# Patient Record
Sex: Female | Born: 1950
Health system: Southern US, Community
[De-identification: ages and names within clinical notes are randomized; demographics above are authoritative.]

## PROBLEM LIST (undated history)

## (undated) DIAGNOSIS — K760 Fatty (change of) liver, not elsewhere classified: Secondary | ICD-10-CM

## (undated) DIAGNOSIS — N1832 Chronic kidney disease, stage 3b: Secondary | ICD-10-CM

## (undated) DIAGNOSIS — R945 Abnormal results of liver function studies: Secondary | ICD-10-CM

## (undated) DIAGNOSIS — E1140 Type 2 diabetes mellitus with diabetic neuropathy, unspecified: Secondary | ICD-10-CM

## (undated) DIAGNOSIS — K4090 Unilateral inguinal hernia, without obstruction or gangrene, not specified as recurrent: Secondary | ICD-10-CM

## (undated) DIAGNOSIS — I10 Essential (primary) hypertension: Secondary | ICD-10-CM

## (undated) DIAGNOSIS — IMO0002 Reserved for concepts with insufficient information to code with codable children: Secondary | ICD-10-CM

## (undated) DIAGNOSIS — E785 Hyperlipidemia, unspecified: Secondary | ICD-10-CM

## (undated) DIAGNOSIS — E1165 Type 2 diabetes mellitus with hyperglycemia: Secondary | ICD-10-CM

## (undated) DIAGNOSIS — M4714 Other spondylosis with myelopathy, thoracic region: Secondary | ICD-10-CM

## (undated) DIAGNOSIS — Z993 Dependence on wheelchair: Secondary | ICD-10-CM

## (undated) DIAGNOSIS — R7989 Other specified abnormal findings of blood chemistry: Secondary | ICD-10-CM

## (undated) DIAGNOSIS — K429 Umbilical hernia without obstruction or gangrene: Secondary | ICD-10-CM

## (undated) HISTORY — DX: Other specified abnormal findings of blood chemistry: R79.89

## (undated) HISTORY — DX: Dependence on wheelchair: Z99.3

## (undated) HISTORY — DX: Type 2 diabetes mellitus with diabetic neuropathy, unspecified: E11.65

## (undated) HISTORY — DX: Umbilical hernia without obstruction or gangrene: K42.9

## (undated) HISTORY — DX: Reserved for concepts with insufficient information to code with codable children: IMO0002

## (undated) HISTORY — DX: Type 2 diabetes mellitus with diabetic neuropathy, unspecified: E11.40

## (undated) HISTORY — DX: Fatty (change of) liver, not elsewhere classified: K76.0

## (undated) HISTORY — DX: Unilateral inguinal hernia, without obstruction or gangrene, not specified as recurrent: K40.90

## (undated) HISTORY — DX: Chronic kidney disease, stage 3b: N18.32

## (undated) HISTORY — PX: BREAST BIOPSY: SHX20

## (undated) HISTORY — DX: Other spondylosis with myelopathy, thoracic region: M47.14

## (undated) HISTORY — PX: LUMBAR DISC SURGERY: SHX700

## (undated) HISTORY — PX: CATARACT EXTRACTION: SUR2

## (undated) HISTORY — DX: Abnormal results of liver function studies: R94.5

---
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
## 2015-07-03 ENCOUNTER — Inpatient Hospital Stay (HOSPITAL_COMMUNITY)
Admission: EM | Admit: 2015-07-03 | Discharge: 2015-07-10 | DRG: 516 | Disposition: A | Discharge status: Skilled Nursing Facility | Payer: Medicare Other | Attending: Internal Medicine | Admitting: Internal Medicine

## 2015-07-03 ENCOUNTER — Emergency Department (HOSPITAL_COMMUNITY): Payer: Medicare Other

## 2015-07-03 ENCOUNTER — Encounter (HOSPITAL_COMMUNITY): Payer: Self-pay

## 2015-07-03 DIAGNOSIS — E1159 Type 2 diabetes mellitus with other circulatory complications: Secondary | ICD-10-CM | POA: Diagnosis present

## 2015-07-03 DIAGNOSIS — Z794 Long term (current) use of insulin: Secondary | ICD-10-CM

## 2015-07-03 DIAGNOSIS — I10 Essential (primary) hypertension: Secondary | ICD-10-CM | POA: Diagnosis present

## 2015-07-03 DIAGNOSIS — E1142 Type 2 diabetes mellitus with diabetic polyneuropathy: Secondary | ICD-10-CM | POA: Diagnosis present

## 2015-07-03 DIAGNOSIS — N390 Urinary tract infection, site not specified: Secondary | ICD-10-CM | POA: Diagnosis not present

## 2015-07-03 DIAGNOSIS — Z7982 Long term (current) use of aspirin: Secondary | ICD-10-CM

## 2015-07-03 DIAGNOSIS — R29898 Other symptoms and signs involving the musculoskeletal system: Secondary | ICD-10-CM | POA: Diagnosis present

## 2015-07-03 DIAGNOSIS — E1140 Type 2 diabetes mellitus with diabetic neuropathy, unspecified: Secondary | ICD-10-CM | POA: Diagnosis present

## 2015-07-03 DIAGNOSIS — G8222 Paraplegia, incomplete: Secondary | ICD-10-CM | POA: Diagnosis present

## 2015-07-03 DIAGNOSIS — E785 Hyperlipidemia, unspecified: Secondary | ICD-10-CM | POA: Diagnosis present

## 2015-07-03 DIAGNOSIS — E1121 Type 2 diabetes mellitus with diabetic nephropathy: Secondary | ICD-10-CM | POA: Diagnosis present

## 2015-07-03 DIAGNOSIS — Z993 Dependence on wheelchair: Secondary | ICD-10-CM

## 2015-07-03 DIAGNOSIS — I129 Hypertensive chronic kidney disease with stage 1 through stage 4 chronic kidney disease, or unspecified chronic kidney disease: Secondary | ICD-10-CM | POA: Diagnosis present

## 2015-07-03 DIAGNOSIS — K760 Fatty (change of) liver, not elsewhere classified: Secondary | ICD-10-CM | POA: Diagnosis present

## 2015-07-03 DIAGNOSIS — Z79899 Other long term (current) drug therapy: Secondary | ICD-10-CM

## 2015-07-03 DIAGNOSIS — M5126 Other intervertebral disc displacement, lumbar region: Secondary | ICD-10-CM

## 2015-07-03 DIAGNOSIS — S82122A Displaced fracture of lateral condyle of left tibia, initial encounter for closed fracture: Secondary | ICD-10-CM

## 2015-07-03 DIAGNOSIS — E1165 Type 2 diabetes mellitus with hyperglycemia: Secondary | ICD-10-CM | POA: Diagnosis present

## 2015-07-03 DIAGNOSIS — R7989 Other specified abnormal findings of blood chemistry: Secondary | ICD-10-CM | POA: Diagnosis present

## 2015-07-03 DIAGNOSIS — B9620 Unspecified Escherichia coli [E. coli] as the cause of diseases classified elsewhere: Secondary | ICD-10-CM | POA: Diagnosis present

## 2015-07-03 DIAGNOSIS — E875 Hyperkalemia: Secondary | ICD-10-CM | POA: Diagnosis present

## 2015-07-03 DIAGNOSIS — R262 Difficulty in walking, not elsewhere classified: Secondary | ICD-10-CM | POA: Diagnosis present

## 2015-07-03 DIAGNOSIS — Z6841 Body Mass Index (BMI) 40.0 and over, adult: Secondary | ICD-10-CM

## 2015-07-03 DIAGNOSIS — M4804 Spinal stenosis, thoracic region: Principal | ICD-10-CM | POA: Diagnosis present

## 2015-07-03 DIAGNOSIS — N183 Chronic kidney disease, stage 3 (moderate): Secondary | ICD-10-CM | POA: Diagnosis present

## 2015-07-03 DIAGNOSIS — Z419 Encounter for procedure for purposes other than remedying health state, unspecified: Secondary | ICD-10-CM

## 2015-07-03 DIAGNOSIS — R945 Abnormal results of liver function studies: Secondary | ICD-10-CM

## 2015-07-03 DIAGNOSIS — M5124 Other intervertebral disc displacement, thoracic region: Secondary | ICD-10-CM

## 2015-07-03 DIAGNOSIS — W19XXXA Unspecified fall, initial encounter: Secondary | ICD-10-CM | POA: Diagnosis present

## 2015-07-03 DIAGNOSIS — E1122 Type 2 diabetes mellitus with diabetic chronic kidney disease: Secondary | ICD-10-CM | POA: Diagnosis present

## 2015-07-03 HISTORY — DX: Essential (primary) hypertension: I10

## 2015-07-03 HISTORY — DX: Hyperlipidemia, unspecified: E78.5

## 2015-07-03 LAB — URINALYSIS, ROUTINE W REFLEX MICROSCOPIC
GLUCOSE, UA: 250 mg/dL — AB
KETONES UR: 15 mg/dL — AB
Nitrite: POSITIVE — AB
PH: 5 (ref 5.0–8.0)
Protein, ur: 30 mg/dL — AB
Specific Gravity, Urine: 1.025 (ref 1.005–1.030)

## 2015-07-03 LAB — CBC WITH DIFFERENTIAL/PLATELET
BASOS ABS: 0 10*3/uL (ref 0.0–0.1)
Basophils Relative: 1 %
Eosinophils Absolute: 0.3 10*3/uL (ref 0.0–0.7)
Eosinophils Relative: 4 %
HEMATOCRIT: 38.9 % (ref 36.0–46.0)
Hemoglobin: 12.3 g/dL (ref 12.0–15.0)
LYMPHS ABS: 2.9 10*3/uL (ref 0.7–4.0)
LYMPHS PCT: 44 %
MCH: 28.7 pg (ref 26.0–34.0)
MCHC: 31.6 g/dL (ref 30.0–36.0)
MCV: 90.9 fL (ref 78.0–100.0)
MONO ABS: 0.6 10*3/uL (ref 0.1–1.0)
Monocytes Relative: 9 %
NEUTROS ABS: 2.8 10*3/uL (ref 1.7–7.7)
Neutrophils Relative %: 42 %
Platelets: 327 10*3/uL (ref 150–400)
RBC: 4.28 MIL/uL (ref 3.87–5.11)
RDW: 14 % (ref 11.5–15.5)
WBC: 6.6 10*3/uL (ref 4.0–10.5)

## 2015-07-03 LAB — URINE MICROSCOPIC-ADD ON

## 2015-07-03 LAB — COMPREHENSIVE METABOLIC PANEL
ALT: 58 U/L — AB (ref 14–54)
AST: 66 U/L — AB (ref 15–41)
Albumin: 3.6 g/dL (ref 3.5–5.0)
Alkaline Phosphatase: 70 U/L (ref 38–126)
Anion gap: 13 (ref 5–15)
BILIRUBIN TOTAL: 0.7 mg/dL (ref 0.3–1.2)
BUN: 19 mg/dL (ref 6–20)
CO2: 25 mmol/L (ref 22–32)
CREATININE: 1.27 mg/dL — AB (ref 0.44–1.00)
Calcium: 10 mg/dL (ref 8.9–10.3)
Chloride: 102 mmol/L (ref 101–111)
GFR, EST AFRICAN AMERICAN: 50 mL/min — AB (ref >60)
GFR, EST NON AFRICAN AMERICAN: 43 mL/min — AB (ref >60)
Glucose, Bld: 180 mg/dL — ABNORMAL HIGH (ref 65–99)
Potassium: 4.5 mmol/L (ref 3.5–5.1)
Sodium: 140 mmol/L (ref 135–145)
TOTAL PROTEIN: 7.2 g/dL (ref 6.5–8.1)

## 2015-07-03 LAB — I-STAT TROPONIN, ED: TROPONIN I, POC: 0 ng/mL (ref 0.00–0.08)

## 2015-07-03 MED ORDER — MORPHINE SULFATE (PF) 4 MG/ML IV SOLN
4.0000 mg | Freq: Once | INTRAVENOUS | Status: AC
  Administered 2015-07-03: 4 mg via INTRAVENOUS
  Filled 2015-07-03: qty 1

## 2015-07-03 MED ORDER — DEXTROSE 5 % IV SOLN
1.0000 g | Freq: Once | INTRAVENOUS | Status: AC
  Administered 2015-07-03: 1 g via INTRAVENOUS
  Filled 2015-07-03 (×2): qty 10

## 2015-07-03 NOTE — Progress Notes (Addendum)
[facility].  Per EDP, patient has home health services, believes she needs placement.  EDCM [facility] who will see patient.   07/03/2015 A. Areyanna Figeroa RNCM 2245pm Patient reports her pcp is a female last name Daphine DeutscherMartin whom she has not seen.  She reports she does not know what home health agency she has been set up with.  EDCM has not spoken to this patient.  This [facility].  Patient has been difficult to speak too as she has been having multiple procedures performed while in the ED.

## 2015-07-03 NOTE — Progress Notes (Signed)
Spoke with pt re: her disposition.  [facility] cannot manage [facility] in her [facility].  [facility], following a 21 day stay.  Pt's admission status unclear at this time.  If pt remains in ED overnight, CSW will pursue placement from the ED.  PT/OT c/s will be necessary prior to placement, EDP informed and will order.  CSW will also need to [facility] other[facility] facility her [facility] benefits/remaining SNF, as she has had a recent stay.  CSW will continue to follow.

## 2015-07-03 NOTE — ED Notes (Signed)
Pt reported that she did not take her diuretic today b/c she did not want to "go to the bathroom" because she "did not want to fall again."

## 2015-07-03 NOTE — ED Provider Notes (Signed)
CSN: [id number]     Arrival date & time 07/03/15  1855 History   First MD Initiated Contact with Patient 07/03/15 1902     Chief Complaint  Patient presents with  . Fall  . Weakness     (Consider location/radiation/quality/duration/timing/severity/associated sxs/prior Treatment) The history is provided by the patient.  Tressie StalkerBarbara Urbanek is a 65 y.o. female hx of DM, HTN, HL, Here presenting with weakness. Patient actually has chronic left leg weakness for the last Several months. [facility] and had extensive workup. She had MRI brain that was unremarkable, MRI spine and multiple xrays that was unremarkable per patient. [facility] and stayed there for 21 days. She was still weak so [facility] and was discharged 2 days ago. She has been at home for the last 2 days. States that she was weak and trouble bearing weight on the left leg at rehab. Since she came home, her left leg gave out and she fell multiple times. Denies head injury. Has left knee pain and back pain.    Past Medical History  Diagnosis Date  . Diabetes mellitus without complication (HCC)   . Hypertension   . Hyperlipemia    History reviewed. No pertinent past surgical history. No family history on file. Social History  Substance Use Topics  . Smoking status: Never Smoker   . Smokeless tobacco: None  . Alcohol Use: No   OB History    No data available     Review of Systems  Neurological: Positive for weakness.  All other systems reviewed and are negative.     Allergies  Review of patient's allergies indicates no known allergies.  Home Medications   Prior to Admission medications   Medication Sig Start Date End Date Taking? Authorizing Provider  amLODipine (NORVASC) 10 MG tablet Take 10 mg by mouth daily. 07/02/15  Yes Historical Provider, MD  aspirin EC 81 MG tablet Take 81 mg by mouth every evening.   Yes Historical Provider, MD   Cholecalciferol (VITAMIN D PO) Take 1 tablet by mouth daily.   Yes Historical Provider, MD  gabapentin (NEURONTIN) 300 MG capsule Take 300 mg by mouth 3 (three) times daily. 07/02/15  Yes Historical Provider, MD  HUMALOG KWIKPEN 100 UNIT/ML KiwkPen Inject 18 Units into the skin 3 (three) times daily with meals. 07/02/15  Yes Historical Provider, MD  hydrochlorothiazide (HYDRODIURIL) 25 MG tablet Take 25 mg by mouth daily. 06/01/15  Yes Historical Provider, MD  lisinopril (PRINIVIL,ZESTRIL) 10 MG tablet Take 10 mg by mouth daily. 07/02/15  Yes Historical Provider, MD  lovastatin (MEVACOR) 40 MG tablet Take 40 mg by mouth every evening. 07/02/15  Yes Historical Provider, MD  meloxicam (MOBIC) 7.5 MG tablet Take 7.5 mg by mouth daily. 07/02/15  Yes Historical Provider, MD  metFORMIN (GLUCOPHAGE) 500 MG tablet Take 500 mg by mouth 2 (two) times daily. 07/02/15  Yes Historical Provider, MD  TOUJEO SOLOSTAR 300 UNIT/ML SOPN Inject 45 Units into the skin every morning. 07/03/15  Yes Historical Provider, MD   BP 123/56 mmHg  Pulse 78  Temp(Src) 99 F (37.2 C) (Oral)  Resp 16  SpO2 96% Physical Exam  Constitutional: She is oriented to person, place, and time.  Uncomfortable, chronically ill   HENT:  Head: Normocephalic.  Mouth/Throat: Oropharynx is clear and moist.  Eyes: Conjunctivae are normal. Pupils are equal, round, and reactive to light.  Neck: Normal range of motion.  Neck supple.  Cardiovascular: Normal rate, regular rhythm and normal heart sounds.   Pulmonary/Chest: Effort normal and breath sounds normal. No respiratory distress. She has no wheezes. She has no rales.  Abdominal: Soft. Bowel sounds are normal. She exhibits no distension. There is no tenderness. There is no rebound.  Musculoskeletal:  L knee slightly swollen, tender, dec ROM but no obvious deformity. Pain with ranging the l hip but nl ROM. Able to wiggle toes. Mild lower spinal tenderness. Mild L calf tenderness   Neurological: She is  alert and oriented to person, place, and time.  Strength 2/5 L leg (chronic), nl reflexes, nl sensation.   Skin: Skin is warm and dry.  Psychiatric: She has a normal mood and affect. Her behavior is normal. Judgment and thought content normal.  Nursing note and vitals reviewed.   ED Course  Procedures (including critical care time) Labs Review Labs Reviewed  COMPREHENSIVE METABOLIC PANEL - Abnormal; Notable for the following:    Glucose, Bld 180 (*)    Creatinine, Ser 1.27 (*)    AST 66 (*)    ALT 58 (*)    GFR calc non Af Amer 43 (*)    GFR calc Af Amer 50 (*)    All other components within normal limits  URINALYSIS, ROUTINE W REFLEX MICROSCOPIC (NOT AT [facility]) - Abnormal; Notable for the following:    Color, Urine AMBER (*)    APPearance TURBID (*)    Glucose, UA 250 (*)    Hgb urine dipstick TRACE (*)    Bilirubin Urine SMALL (*)    Ketones, ur 15 (*)    Protein, ur 30 (*)    Nitrite POSITIVE (*)    Leukocytes, UA SMALL (*)    All other components within normal limits  URINE MICROSCOPIC-ADD ON - Abnormal; Notable for the following:    Squamous Epithelial / LPF 6-30 (*)    Bacteria, UA MANY (*)    All other components within normal limits  URINE CULTURE  CBC WITH DIFFERENTIAL/PLATELET  Rosezena Sensor, ED    Imaging Review Dg Chest 2 View  07/03/2015  CLINICAL DATA:  Recent fall. EXAM: CHEST  2 VIEW COMPARISON:  None. FINDINGS: Two views of the chest were obtained. Lungs are clear without airspace disease, pulmonary edema or large pneumothorax. Haziness in the left lower chest probably related to overlying soft tissues. No large pleural effusions. No acute bone abnormalities. IMPRESSION: No active cardiopulmonary disease. Electronically Signed   By: Richarda Overlie M.D.   On: 07/03/2015 21:19   Dg Lumbar Spine Complete  07/03/2015  CLINICAL DATA:  65 year old female with fall and left leg pain. EXAM: LUMBAR SPINE - COMPLETE 4+ VIEW COMPARISON:  None. FINDINGS: There is no acute  fracture or subluxation of the lumbar spine. The vertebral body heights and disc spaces are maintained. The visualized transverse and spinous processes are intact. The soft tissues are grossly unremarkable. A small amorphous calcific density in the left hemipelvis most compatible with a calcified fibroid. IMPRESSION: No acute/traumatic lumbar spine pathology. Electronically Signed   By: Elgie Collard M.D.   On: 07/03/2015 21:19   Dg Shoulder Left  07/03/2015  CLINICAL DATA:  Fall yesterday and today. Left shoulder pain and decreased range of motion. Initial encounter. EXAM: LEFT SHOULDER - 2+ VIEW COMPARISON:  None. FINDINGS: There is no evidence of fracture or dislocation. Mild acromioclavicular degenerative spurring noted. No other bone lesions identified. Soft tissues are unremarkable. IMPRESSION: No acute findings.  Mild acromioclavicular  DJD. Electronically Signed   By: Myles Rosenthal M.D.   On: 07/03/2015 21:19   Dg Knee Complete 4 Views Left  07/03/2015  CLINICAL DATA:  Recurrent falls during past 2 days. Left knee pain and limited mobility. Initial encounter. EXAM: LEFT KNEE - COMPLETE 4+ VIEW COMPARISON:  None. FINDINGS: Diffuse soft tissue swelling noted. A tiny avulsion fracture fragment is seen from the lateral tibial spine. No other fractures are identified. Alignment is normal. No evidence of knee joint effusion. IMPRESSION: Diffuse soft tissue swelling. Tiny avulsion fracture fragment from the lateral tibial spine. Electronically Signed   By: Myles Rosenthal M.D.   On: 07/03/2015 21:21   Dg Hip Unilat With Pelvis 2-3 Views Left  07/03/2015  CLINICAL DATA:  65 year old female with fall and left leg pain. EXAM: DG HIP (WITH OR WITHOUT PELVIS) 2-3V LEFT COMPARISON:  None. FINDINGS: There is no acute fracture or dislocation. No significant degenerative changes. A 1.8 x 2.4 cm amorphous calcific density in the left hemipelvis most likely represents a fibroid. The soft tissues appear unremarkable.  IMPRESSION: No fracture or dislocation. Electronically Signed   By: Elgie Collard M.D.   On: 07/03/2015 21:17   Dg Femur Min 2 Views Left  07/03/2015  CLINICAL DATA: Fall On Sunday and Monday.  Left leg pain. EXAM: LEFT FEMUR 2 VIEWS COMPARISON:  None. FINDINGS: No acute bony abnormality. Specifically, no fracture, subluxation, or dislocation. Soft tissues are intact. Calcification in the left side of the pelvis, likely fibroid. Vascular calcifications noted within the left thigh. IMPRESSION: No acute bony abnormality. Electronically Signed   By: Charlett Nose M.D.   On: 07/03/2015 21:18   I have personally reviewed and evaluated these images and lab results as part of my medical decision-making.   Angiocath insertion Performed by: Chaney Malling  Consent: Verbal consent obtained. Risks and benefits: risks, benefits and alternatives were discussed Time out: Immediately prior to procedure a "time out" was called to verify the correct patient, procedure, equipment, support staff and site/side marked as required.  Preparation: Patient was prepped and draped in the usual sterile fashion.  Vein Location: L cephalic  Ultrasound Guided  Gauge: 20 long   Normal blood return and flush without difficulty Patient tolerance: Patient tolerated the procedure well with no immediate complications.      EKG Interpretation   Date/Time:  Monday Jul 03 2015 19:27:54 EDT Ventricular Rate:  77 PR Interval:  139 QRS Duration: 82 QT Interval:  361 QTC Calculation: 408 R Axis:   16 Text Interpretation:  Sinus rhythm Low voltage, precordial leads No  previous ECGs available Confirmed by YAO  MD, DAVID ([id number]) on 07/03/2015  7:31:08 PM      MDM   Final diagnoses:  Fall   Sloan Galentine is a 65 y.o. female here with L leg weakness, falls. Weakness has been chronic and patient was sent home from rehab 2 days ago. Given that she fell, will repeat xrays, labs. Will consult case management regarding  rehab   11:18 pm UA + UTI. Given ceftriaxone. Xray knee showed possible avulsion fracture of the tibia. Will get CT.   11:52 PM CT pending. Social work saw patient. Anticipate medical admission either way. CT knee pending, if has tibial plateau fracture, will need ortho consult. Signed out to Dr. Mora Bellman.     Richardean Canal, MD 07/03/15 [phone]

## 2015-07-03 NOTE — ED Notes (Signed)
Pt arrived via [facility] coming from [facility]. Pt c/o generalized weakness x several days. Pt has fallen x2 in the last 24 hours. Pt c/o pain in her L lower leg with swelling reported by EMS. Pt also c/o chest wall pain. Pt denies hitting her head and LOC. Pt is A/Ox4 in NAD.

## 2015-07-04 ENCOUNTER — Encounter (HOSPITAL_COMMUNITY): Payer: Self-pay | Admitting: Family Medicine

## 2015-07-04 ENCOUNTER — Observation Stay (HOSPITAL_COMMUNITY): Payer: Medicare Other

## 2015-07-04 ENCOUNTER — Observation Stay (HOSPITAL_BASED_OUTPATIENT_CLINIC_OR_DEPARTMENT_OTHER): Payer: Medicare Other

## 2015-07-04 DIAGNOSIS — R29898 Other symptoms and signs involving the musculoskeletal system: Secondary | ICD-10-CM

## 2015-07-04 DIAGNOSIS — R945 Abnormal results of liver function studies: Secondary | ICD-10-CM

## 2015-07-04 DIAGNOSIS — N390 Urinary tract infection, site not specified: Secondary | ICD-10-CM | POA: Diagnosis present

## 2015-07-04 DIAGNOSIS — E1140 Type 2 diabetes mellitus with diabetic neuropathy, unspecified: Secondary | ICD-10-CM | POA: Diagnosis not present

## 2015-07-04 DIAGNOSIS — M7989 Other specified soft tissue disorders: Secondary | ICD-10-CM | POA: Diagnosis not present

## 2015-07-04 DIAGNOSIS — I10 Essential (primary) hypertension: Secondary | ICD-10-CM | POA: Diagnosis present

## 2015-07-04 DIAGNOSIS — E1121 Type 2 diabetes mellitus with diabetic nephropathy: Secondary | ICD-10-CM | POA: Diagnosis present

## 2015-07-04 DIAGNOSIS — E1159 Type 2 diabetes mellitus with other circulatory complications: Secondary | ICD-10-CM | POA: Diagnosis present

## 2015-07-04 DIAGNOSIS — R262 Difficulty in walking, not elsewhere classified: Secondary | ICD-10-CM | POA: Diagnosis not present

## 2015-07-04 DIAGNOSIS — E1142 Type 2 diabetes mellitus with diabetic polyneuropathy: Secondary | ICD-10-CM | POA: Diagnosis present

## 2015-07-04 DIAGNOSIS — R7989 Other specified abnormal findings of blood chemistry: Secondary | ICD-10-CM | POA: Diagnosis present

## 2015-07-04 DIAGNOSIS — Z794 Long term (current) use of insulin: Secondary | ICD-10-CM

## 2015-07-04 DIAGNOSIS — E1165 Type 2 diabetes mellitus with hyperglycemia: Secondary | ICD-10-CM | POA: Diagnosis present

## 2015-07-04 DIAGNOSIS — G8222 Paraplegia, incomplete: Secondary | ICD-10-CM | POA: Insufficient documentation

## 2015-07-04 HISTORY — DX: Morbid (severe) obesity due to excess calories: E66.01

## 2015-07-04 HISTORY — DX: Other symptoms and signs involving the musculoskeletal system: R29.898

## 2015-07-04 LAB — COMPREHENSIVE METABOLIC PANEL
ALBUMIN: 3.4 g/dL — AB (ref 3.5–5.0)
ALT: 53 U/L (ref 14–54)
AST: 59 U/L — AB (ref 15–41)
Alkaline Phosphatase: 62 U/L (ref 38–126)
Anion gap: 11 (ref 5–15)
BUN: 21 mg/dL — ABNORMAL HIGH (ref 6–20)
CHLORIDE: 106 mmol/L (ref 101–111)
CO2: 24 mmol/L (ref 22–32)
CREATININE: 1.24 mg/dL — AB (ref 0.44–1.00)
Calcium: 9.5 mg/dL (ref 8.9–10.3)
GFR calc non Af Amer: 44 mL/min — ABNORMAL LOW (ref >60)
GFR, EST AFRICAN AMERICAN: 51 mL/min — AB (ref >60)
Glucose, Bld: 271 mg/dL — ABNORMAL HIGH (ref 65–99)
Potassium: 4.5 mmol/L (ref 3.5–5.1)
SODIUM: 141 mmol/L (ref 135–145)
Total Bilirubin: 0.6 mg/dL (ref 0.3–1.2)
Total Protein: 6.6 g/dL (ref 6.5–8.1)

## 2015-07-04 LAB — CREATININE, SERUM
Creatinine, Ser: 1.23 mg/dL — ABNORMAL HIGH (ref 0.44–1.00)
GFR calc non Af Amer: 45 mL/min — ABNORMAL LOW (ref >60)
GFR, EST AFRICAN AMERICAN: 52 mL/min — AB (ref >60)

## 2015-07-04 LAB — CBC
HEMATOCRIT: 36.2 % (ref 36.0–46.0)
HEMOGLOBIN: 11.4 g/dL — AB (ref 12.0–15.0)
MCH: 28.9 pg (ref 26.0–34.0)
MCHC: 31.5 g/dL (ref 30.0–36.0)
MCV: 91.6 fL (ref 78.0–100.0)
PLATELETS: 277 10*3/uL (ref 150–400)
RBC: 3.95 MIL/uL (ref 3.87–5.11)
RDW: 13.9 % (ref 11.5–15.5)
WBC: 6.1 10*3/uL (ref 4.0–10.5)

## 2015-07-04 LAB — GLUCOSE, CAPILLARY
GLUCOSE-CAPILLARY: 185 mg/dL — AB (ref 65–99)
GLUCOSE-CAPILLARY: 248 mg/dL — AB (ref 65–99)
Glucose-Capillary: 171 mg/dL — ABNORMAL HIGH (ref 65–99)
Glucose-Capillary: 240 mg/dL — ABNORMAL HIGH (ref 65–99)
Glucose-Capillary: 237 mg/dL — ABNORMAL HIGH (ref 65–99)
Glucose-Capillary: 271 mg/dL — ABNORMAL HIGH (ref 65–99)

## 2015-07-04 MED ORDER — HYDROCHLOROTHIAZIDE 25 MG PO TABS
25.0000 mg | ORAL_TABLET | Freq: Every day | ORAL | Status: DC
  Administered 2015-07-04 – 2015-07-07 (×3): 25 mg via ORAL
  Filled 2015-07-04 (×5): qty 1

## 2015-07-04 MED ORDER — INSULIN GLARGINE 100 UNIT/ML ~~LOC~~ SOLN
45.0000 [IU] | Freq: Every day | SUBCUTANEOUS | Status: DC
  Administered 2015-07-04 – 2015-07-05 (×2): 45 [IU] via SUBCUTANEOUS
  Filled 2015-07-04 (×2): qty 0.45

## 2015-07-04 MED ORDER — SODIUM CHLORIDE 0.9 % IV BOLUS (SEPSIS)
500.0000 mL | Freq: Once | INTRAVENOUS | Status: AC
  Administered 2015-07-04: 500 mL via INTRAVENOUS

## 2015-07-04 MED ORDER — ONDANSETRON HCL 4 MG PO TABS: 4.0000 mg | ORAL_TABLET | Freq: Four times a day (QID) | ORAL | Status: DC | PRN

## 2015-07-04 MED ORDER — PRAVASTATIN SODIUM 40 MG PO TABS
40.0000 mg | ORAL_TABLET | Freq: Every day | ORAL | Status: DC
Start: 2015-07-04 — End: 2015-07-08
  Administered 2015-07-04 – 2015-07-07 (×3): 40 mg via ORAL
  Filled 2015-07-04 (×6): qty 1

## 2015-07-04 MED ORDER — INSULIN GLARGINE 300 UNIT/ML ~~LOC~~ SOPN: 45.0000 [IU] | PEN_INJECTOR | Freq: Every morning | SUBCUTANEOUS | Status: DC

## 2015-07-04 MED ORDER — LORAZEPAM 2 MG/ML IJ SOLN
INTRAMUSCULAR | Status: AC
  Administered 2015-07-04: 1 mg via INTRAVENOUS
  Filled 2015-07-04: qty 1

## 2015-07-04 MED ORDER — ACETAMINOPHEN 325 MG PO TABS: 650.0000 mg | ORAL_TABLET | Freq: Four times a day (QID) | ORAL | Status: DC | PRN

## 2015-07-04 MED ORDER — LORAZEPAM 2 MG/ML IJ SOLN
1.0000 mg | Freq: Once | INTRAMUSCULAR | Status: AC
  Administered 2015-07-04: 1 mg via INTRAVENOUS

## 2015-07-04 MED ORDER — INSULIN ASPART 100 UNIT/ML ~~LOC~~ SOLN
0.0000 [IU] | Freq: Three times a day (TID) | SUBCUTANEOUS | Status: DC
  Administered 2015-07-04 (×2): 5 [IU] via SUBCUTANEOUS
  Administered 2015-07-04: 3 [IU] via SUBCUTANEOUS
  Administered 2015-07-05 (×2): 5 [IU] via SUBCUTANEOUS
  Administered 2015-07-06: 3 [IU] via SUBCUTANEOUS
  Administered 2015-07-07: 15 [IU] via SUBCUTANEOUS
  Administered 2015-07-07 – 2015-07-08 (×3): 8 [IU] via SUBCUTANEOUS
  Administered 2015-07-08 – 2015-07-09 (×2): 3 [IU] via SUBCUTANEOUS
  Administered 2015-07-09: 2 [IU] via SUBCUTANEOUS
  Administered 2015-07-10: 3 [IU] via SUBCUTANEOUS
  Administered 2015-07-10 (×2): 2 [IU] via SUBCUTANEOUS

## 2015-07-04 MED ORDER — ENOXAPARIN SODIUM 40 MG/0.4ML ~~LOC~~ SOLN
40.0000 mg | SUBCUTANEOUS | Status: DC
  Administered 2015-07-04 – 2015-07-05 (×2): 40 mg via SUBCUTANEOUS
  Filled 2015-07-04 (×2): qty 0.4

## 2015-07-04 MED ORDER — CEPHALEXIN 500 MG PO CAPS: 500.0000 mg | ORAL_CAPSULE | Freq: Two times a day (BID) | ORAL | Status: DC

## 2015-07-04 MED ORDER — LISINOPRIL 10 MG PO TABS
10.0000 mg | ORAL_TABLET | Freq: Every day | ORAL | Status: DC
  Administered 2015-07-04 – 2015-07-07 (×3): 10 mg via ORAL
  Filled 2015-07-04 (×3): qty 1

## 2015-07-04 MED ORDER — ONDANSETRON HCL 4 MG/2ML IJ SOLN
4.0000 mg | Freq: Four times a day (QID) | INTRAMUSCULAR | Status: DC | PRN
  Administered 2015-07-06: 4 mg via INTRAVENOUS
  Filled 2015-07-04: qty 2

## 2015-07-04 MED ORDER — ACETAMINOPHEN 650 MG RE SUPP: 650.0000 mg | Freq: Four times a day (QID) | RECTAL | Status: DC | PRN

## 2015-07-04 MED ORDER — AMLODIPINE BESYLATE 10 MG PO TABS
10.0000 mg | ORAL_TABLET | Freq: Every day | ORAL | Status: DC
  Administered 2015-07-04 – 2015-07-10 (×7): 10 mg via ORAL
  Filled 2015-07-04 (×7): qty 1

## 2015-07-04 MED ORDER — ASPIRIN EC 81 MG PO TBEC
81.0000 mg | DELAYED_RELEASE_TABLET | Freq: Every evening | ORAL | Status: DC
  Administered 2015-07-04 – 2015-07-07 (×3): 81 mg via ORAL
  Filled 2015-07-04 (×6): qty 1

## 2015-07-04 MED ORDER — INSULIN ASPART 100 UNIT/ML ~~LOC~~ SOLN
0.0000 [IU] | Freq: Every day | SUBCUTANEOUS | Status: DC
  Administered 2015-07-04 – 2015-07-05 (×2): 2 [IU] via SUBCUTANEOUS
  Administered 2015-07-06: 3 [IU] via SUBCUTANEOUS
  Administered 2015-07-07: 2 [IU] via SUBCUTANEOUS

## 2015-07-04 NOTE — Progress Notes (Signed)
Inpatient Diabetes Program Recommendations  AACE/ADA: New Consensus Statement on Inpatient Glycemic Control (2015)  Target Ranges:  Prepandial:   less than 140 mg/dL      Peak postprandial:   less than 180 mg/dL (1-2 hours)      Critically ill patients:  140 - 180 mg/dL   Review of Glycemic Control:  Results for Cassandra StalkerBLOOMFIELD, [name] (MRN [id number]) as of 07/04/2015 10:30  Ref. Range 07/04/2015 01:56 07/04/2015 08:52  Glucose-Capillary Latest Ref Range: 65-99 mg/dL 562171 (H) 130240 (H)   Diabetes history: Type 2 diabetes Outpatient Diabetes medications: Metformin 500 mg bid, Toujeo 45 units q AM, Humalog 18 units tid with meals  Current orders for Inpatient glycemic control:  Lantus 45 units daily, Novolog moderate tid with meals and HS Inpatient Diabetes Program Recommendations:    May consider adding Novolog meal coverage 8 units tid with meals (hold if patient eats less than 50%).  Thanks, Beryl MeagerJenny Khalani Novoa, RN, BC-ADM Inpatient Diabetes Coordinator Pager [phone] (8a-5p)

## 2015-07-04 NOTE — Evaluation (Addendum)
Physical Therapy Evaluation Patient Details Name: Cassandra Stewart MRN: [id number] DOB: 1950-07-27 Today's Date: 07/04/2015   History of Present Illness  HPI: Cassandra Stewart is a 65 y.o. female with a past medical history significant for IDDM, MO, and HTN who presents with fall from leg weakness and inability to stand; mid-February when she had somewhat abrupt onset of left leg weakness, etiology unclear (recent scans reveal brain and spine normal); one hosptialization for UTI, AKI since; 21 day stay at SNF for rehab, home 2 days, essentially wheelchair bound; Radiograph of the left knee showed possible avulsion fracture, which was also imaged on CT LEFT knee.  Clinical Impression   Pt admitted with above diagnosis. Pt currently with functional limitations due to the deficits listed below (see PT Problem List).  Pt will benefit from skilled PT to increase their independence and safety with mobility to allow discharge to the venue listed below.       Follow Up Recommendations SNF    Equipment Recommendations  Other (comment) (To be determined)    Recommendations for Other Services OT consult     Precautions / Restrictions Precautions Precautions: Fall Restrictions Weight Bearing Restrictions: No      Mobility  Bed Mobility Overal bed mobility: Needs Assistance Bed Mobility: Rolling;Sidelying to Sit Rolling: Min guard (with rail) Sidelying to sit: Mod assist       General bed mobility comments: Light mod assist to help feet clear bed and elevate trunk to fully upright sitting; used rails  Transfers Overall transfer level: Needs assistance Equipment used:  (Sliding board) Transfers: Lateral/Scoot Transfers          Lateral/Scoot Transfers: Mod assist (with second person steadying chair) General transfer comment: Mod assist to initiate scoot across sliding board; Cues to weight shift forward to unweigh hips for scooting; took time to have pt scoot back towards the bed  to test her ability to slide "uphill"  Ambulation/Gait                Stairs            Wheelchair Mobility    Modified Rankin (Stroke Patients Only)       Balance Overall balance assessment: Needs assistance   Sitting balance-Leahy Scale: Good                                       Pertinent Vitals/Pain Pain Assessment: No/denies pain    Home Living Family/patient expects to be discharged to:: Skilled nursing facility Living Arrangements: Alone                    Prior Function Level of Independence: Needs assistance   Gait / Transfers Assistance Needed: wheelchair transfers: scoot transfers with and without sliding board     Comments: She described the two most recent falls at home; both involed teh wheelcahir moving out from under her while she was performing scoot transfers; one getting out of her recliner and one in the bathroom     Hand Dominance        Extremity/Trunk Assessment   Upper Extremity Assessment: Overall WFL for tasks assessed           Lower Extremity Assessment: LLE deficits/detail   LLE Deficits / Details: Decr strength: hip flexion 3/5, knee extension 3/5; ankle dorsiflexion 2/5, ankleplantar flexion 2/5 (tested seated)     Communication   Communication: No  difficulties  Cognition Arousal/Alertness: Awake/alert Behavior During Therapy: WFL for tasks assessed/performed Overall Cognitive Status: Within Functional Limits for tasks assessed                      General Comments General comments (skin integrity, edema, etc.): Pt was very nervous about trying ot stand    Exercises        Assessment/Plan    PT Assessment Patient needs continued PT services  PT Diagnosis Generalized weakness   PT Problem List Decreased strength;Decreased activity tolerance;Decreased balance;Decreased mobility;Decreased coordination;Decreased knowledge of use of DME  PT Treatment Interventions DME  instruction;Gait training;Functional mobility training;Therapeutic activities;Therapeutic exercise;Balance training;Neuromuscular re-education;Patient/family education;Wheelchair mobility training   PT Goals (Current goals can be found in the Care Plan section) Acute Rehab PT Goals Patient Stated Goal: Would like to get bakc to rehab to get stronger PT Goal Formulation: With patient Time For Goal Achievement: 07/18/15 Potential to Achieve Goals: Good    Frequency Min 3X/week   Barriers to discharge Decreased caregiver support She has already had a rehab stay this year; noted SW will be looking into how much benefit for SNF for rehab Ms. Ghosh has left    Co-evaluation               End of Session Equipment Utilized During Treatment:  (slididng board) Activity Tolerance: Patient tolerated treatment well Patient left: in chair;with call bell/phone within reach;with chair alarm set Nurse Communication: Mobility status         Time: 1138-1211 (minus approx 5-8 minutes finding equipment) PT Time Calculation (min) (ACUTE ONLY): 33 min   Charges:   PT Evaluation $PT Eval Moderate Complexity: 1 Procedure PT Treatments $Therapeutic Activity: 8-22 mins   PT G Codes:     07/04/15 1500  PT G-Codes **NOT FOR INPATIENT CLASS**  Functional Assessment Tool Used Clinical Judgement  Functional Limitation Mobility: Walking and moving around  Mobility: Walking and Moving Around Current Status ([id number]) CJ  Mobility: Walking and Moving Around Goal Status [phone]([id number]) CH         Van ClinesGarrigan, Areatha Kalata Hamff 07/04/2015, 3:34 PM  Van ClinesHolly Kenyetta Fife, PT  Acute Rehabilitation Services Pager [phone] Office [phone]

## 2015-07-04 NOTE — Consult Note (Signed)
NEURO HOSPITALIST CONSULT NOTE   Requestig physician: Dr. Thedore Mins   Reason for Consult: left leg weakness   History obtained from:  Patient     HPI:                                                                                                                                          Cassandra Stewart is an 65 y.o. female who has had a 3 month history of left leg pain and weakness which was progressive. Patient states in the past she had a laser surgery for a herniated disk in L5/S1 but this was not successful. She originally was seen at [facility] where a L spine MRI was obtained. "she stated this was normal and had no pathology that she can recall".  She then had PT but due multiple falls she went to rehab at [facility]. The pain and weakness progressed to the point she is wheel chair bound. She is here for further work up of her left lag pain.   Currently she states she has pain that radiates down the back of her leg when lifted, decreased sensation on the lateral aspect of her calf, inability to plantar flex.   Past Medical History  Diagnosis Date  . Diabetes mellitus without complication (HCC)   . Hypertension   . Hyperlipemia     Past Surgical History  Procedure Laterality Date  . Appendectomy  1969  . Lumbar disc surgery      Family History  Problem Relation Age of Onset  . Diabetes Mother   . Heart disease Mother   . Kidney disease Brother     Two brothers on ESRD    Social History:  reports that she has never smoked. She does not have any smokeless tobacco history on file. She reports that she does not drink alcohol or use illicit drugs.  No Known Allergies  MEDICATIONS:                                                                                                                     Prior to Admission:  Prescriptions prior to admission  Medication Sig Dispense Refill Last Dose  . amLODipine (NORVASC) 10 MG tablet Take 10 mg  by mouth daily.   07/03/2015  at Unknown time  . aspirin EC 81 MG tablet Take 81 mg by mouth every evening.   07/02/2015 at Unknown time  . Cholecalciferol (VITAMIN D PO) Take 1 tablet by mouth daily.   07/03/2015 at Unknown time  . gabapentin (NEURONTIN) 300 MG capsule Take 300 mg by mouth 3 (three) times daily.   07/03/2015 at Unknown time  . HUMALOG KWIKPEN 100 UNIT/ML KiwkPen Inject 18 Units into the skin 3 (three) times daily with meals.   07/03/2015 at Unknown time  . hydrochlorothiazide (HYDRODIURIL) 25 MG tablet Take 25 mg by mouth daily.   07/03/2015 at Unknown time  . lisinopril (PRINIVIL,ZESTRIL) 10 MG tablet Take 10 mg by mouth daily.   07/03/2015 at Unknown time  . lovastatin (MEVACOR) 40 MG tablet Take 40 mg by mouth every evening.   07/02/2015 at Unknown time  . meloxicam (MOBIC) 7.5 MG tablet Take 7.5 mg by mouth daily.   07/03/2015 at Unknown time  . metFORMIN (GLUCOPHAGE) 500 MG tablet Take 500 mg by mouth 2 (two) times daily.   07/03/2015 at AM  . TOUJEO SOLOSTAR 300 UNIT/ML SOPN Inject 45 Units into the skin every morning.   07/03/2015 at Unknown time   Scheduled: . amLODipine  10 mg Oral Daily  . aspirin EC  81 mg Oral QPM  . enoxaparin (LOVENOX) injection  40 mg Subcutaneous Q24H  . hydrochlorothiazide  25 mg Oral Daily  . insulin aspart  0-15 Units Subcutaneous TID WC  . insulin aspart  0-5 Units Subcutaneous QHS  . insulin glargine  45 Units Subcutaneous Daily  . lisinopril  10 mg Oral Daily  . pravastatin  40 mg Oral q1800     ROS:                                                                                                                                       History obtained from the patient  General ROS: negative for - chills, fatigue, fever, night sweats, weight gain or weight loss Psychological ROS: negative for - behavioral disorder, hallucinations, memory difficulties, mood swings or suicidal ideation Ophthalmic ROS: negative for - blurry vision, double vision, eye pain or  loss of vision ENT ROS: negative for - epistaxis, nasal discharge, oral lesions, sore throat, tinnitus or vertigo Allergy and Immunology ROS: negative for - hives or itchy/watery eyes Hematological and Lymphatic ROS: negative for - bleeding problems, bruising or swollen lymph nodes Endocrine ROS: negative for - galactorrhea, hair pattern changes, polydipsia/polyuria or temperature intolerance Respiratory ROS: negative for - cough, hemoptysis, shortness of breath or wheezing Cardiovascular ROS: negative for - chest pain, dyspnea on exertion, edema or irregular heartbeat Gastrointestinal ROS: negative for - abdominal pain, diarrhea, hematemesis, nausea/vomiting or stool incontinence Genito-Urinary ROS: negative for - dysuria, hematuria, incontinence or urinary frequency/urgency Musculoskeletal ROS: negative for - joint swelling or muscular weakness Neurological ROS: as noted in HPI Dermatological ROS:  negative for rash and skin lesion changes   Blood pressure 138/58, pulse 73, temperature 98 F (36.7 C), temperature source Oral, resp. rate 17, height 5' 4.5" (1.638 m), weight 117.2 kg (258 lb 6.1 oz), SpO2 96 %.   Neurologic Examination:                                                                                                      HEENT-  Normocephalic, no lesions, without obvious abnormality.  Normal external eye and conjunctiva.  Normal TM's bilaterally.  Normal auditory canals and external ears. Normal external nose, mucus membranes and septum.  Normal pharynx. Cardiovascular- S1, S2 normal, pulses palpable throughout   Lungs- no tachypnea, retractions or cyanosis Abdomen- normal findings: bowel sounds normal Extremities- no edema Lymph-no adenopathy palpable Musculoskeletal-no joint tenderness, deformity or swelling Skin-warm and dry, no hyperpigmentation, vitiligo, or suspicious lesions  Neurological Examination Mental Status: Alert, oriented, thought content appropriate.   Speech fluent without evidence of aphasia.  Able to follow 3 step commands without difficulty. Cranial Nerves: II:  Visual fields grossly normal, pupils equal, round, reactive to light and accommodation III,IV, VI: ptosis not present, extra-ocular motions intact bilaterally V,VII: smile symmetric, facial light touch sensation normal bilaterally VIII: hearing normal bilaterally IX,X: uvula rises symmetrically XI: bilateral shoulder shrug XII: midline tongue extension Motor: Right : Upper extremity   5/5    Left:     Upper extremity   5/5  Lower extremity   5/5     Lower extremity   2/5--significant pain. Mostly in knee and ankle.  Tone and bulk:normal tone throughout; no atrophy noted Sensory: Pinprick and light touch intact throughout UE bilaterally and right leg. Left leg has decreased sensation in the S1 distribution.  Deep Tendon Reflexes: 2+ and symmetric throughout UE and KJ. 1+ in right AJ no left AJ Plantars: Right: downgoing   Left: downgoing Cerebellar: normal finger-to-nose, pain limited the H-S test Gait: not tested      Lab Results: Basic Metabolic Panel:  Recent Labs Lab 07/03/15 2202 07/04/15 0735  NA 140 141  K 4.5 4.5  CL 102 106  CO2 25 24  GLUCOSE 180* 271*  BUN 19 21*  CREATININE 1.27* 1.24*  1.23*  CALCIUM 10.0 9.5    Liver Function Tests:  Recent Labs Lab 07/03/15 2202 07/04/15 0735  AST 66* 59*  ALT 58* 53  ALKPHOS 70 62  BILITOT 0.7 0.6  PROT 7.2 6.6  ALBUMIN 3.6 3.4*   No results for input(s): LIPASE, AMYLASE in the last 168 hours. No results for input(s): AMMONIA in the last 168 hours.  CBC:  Recent Labs Lab 07/03/15 2202 07/04/15 0735  WBC 6.6 6.1  NEUTROABS 2.8  --   HGB 12.3 11.4*  HCT 38.9 36.2  MCV 90.9 91.6  PLT 327 277    Cardiac Enzymes: No results for input(s): CKTOTAL, CKMB, CKMBINDEX, TROPONINI in the last 168 hours.  Lipid Panel: No results for input(s): CHOL, TRIG, HDL, CHOLHDL, VLDL, LDLCALC in the last  657 hours.  CBG:  Recent Labs Lab 07/04/15  0156 07/04/15 0852  GLUCAP 171* 240*    Microbiology: No results found for this or any previous visit.  Coagulation Studies: No results for input(s): LABPROT, INR in the last 72 hours.  Imaging: Dg Chest 2 View  07/03/2015  CLINICAL DATA:  Recent fall. EXAM: CHEST  2 VIEW COMPARISON:  None. FINDINGS: Two views of the chest were obtained. Lungs are clear without airspace disease, pulmonary edema or large pneumothorax. Haziness in the left lower chest probably related to overlying soft tissues. No large pleural effusions. No acute bone abnormalities. IMPRESSION: No active cardiopulmonary disease. Electronically Signed   By: Richarda OverlieAdam  Henn M.D.   On: 07/03/2015 21:19   Dg Lumbar Spine Complete  07/03/2015  CLINICAL DATA:  65 year old female with fall and left leg pain. EXAM: LUMBAR SPINE - COMPLETE 4+ VIEW COMPARISON:  None. FINDINGS: There is no acute fracture or subluxation of the lumbar spine. The vertebral body heights and disc spaces are maintained. The visualized transverse and spinous processes are intact. The soft tissues are grossly unremarkable. A small amorphous calcific density in the left hemipelvis most compatible with a calcified fibroid. IMPRESSION: No acute/traumatic lumbar spine pathology. Electronically Signed   By: Elgie CollardArash  Radparvar M.D.   On: 07/03/2015 21:19   Ct Knee Left Wo Contrast  07/04/2015  CLINICAL DATA:  Larey SeatFell about a week ago. EXAM: CT OF THE left KNEE WITHOUT CONTRAST TECHNIQUE: Multidetector CT imaging of the left knee was performed according to the standard protocol. Multiplanar CT image reconstructions were also generated. COMPARISON:  Radiographs 07/03/2015 FINDINGS: There is no tibial plateau fracture. There is slight fragmentation of the lateral tibial spine which may represent a recent avulsion. There is no significant effusion. There is mild prepatellar soft tissue edema. IMPRESSION: No tibial plateau fracture. Slight  fragmentation of the lateral tibial spine might represent a recent avulsion injury. Electronically Signed   By: Ellery Plunkaniel R Mitchell M.D.   On: 07/04/2015 01:00   Dg Shoulder Left  07/03/2015  CLINICAL DATA:  Fall yesterday and today. Left shoulder pain and decreased range of motion. Initial encounter. EXAM: LEFT SHOULDER - 2+ VIEW COMPARISON:  None. FINDINGS: There is no evidence of fracture or dislocation. Mild acromioclavicular degenerative spurring noted. No other bone lesions identified. Soft tissues are unremarkable. IMPRESSION: No acute findings.  Mild acromioclavicular DJD. Electronically Signed   By: Myles RosenthalJohn  Stahl M.D.   On: 07/03/2015 21:19   Dg Knee Complete 4 Views Left  07/03/2015  CLINICAL DATA:  Recurrent falls during past 2 days. Left knee pain and limited mobility. Initial encounter. EXAM: LEFT KNEE - COMPLETE 4+ VIEW COMPARISON:  None. FINDINGS: Diffuse soft tissue swelling noted. A tiny avulsion fracture fragment is seen from the lateral tibial spine. No other fractures are identified. Alignment is normal. No evidence of knee joint effusion. IMPRESSION: Diffuse soft tissue swelling. Tiny avulsion fracture fragment from the lateral tibial spine. Electronically Signed   By: Myles RosenthalJohn  Stahl M.D.   On: 07/03/2015 21:21   Dg Hip Unilat With Pelvis 2-3 Views Left  07/03/2015  CLINICAL DATA:  65 year old female with fall and left leg pain. EXAM: DG HIP (WITH OR WITHOUT PELVIS) 2-3V LEFT COMPARISON:  None. FINDINGS: There is no acute fracture or dislocation. No significant degenerative changes. A 1.8 x 2.4 cm amorphous calcific density in the left hemipelvis most likely represents a fibroid. The soft tissues appear unremarkable. IMPRESSION: No fracture or dislocation. Electronically Signed   By: Elgie CollardArash  Radparvar M.D.   On: 07/03/2015 21:17  Dg Femur Min 2 Views Left  07/03/2015  CLINICAL DATA: Fall On Sunday and Monday.  Left leg pain. EXAM: LEFT FEMUR 2 VIEWS COMPARISON:  None. FINDINGS: No acute bony  abnormality. Specifically, no fracture, subluxation, or dislocation. Soft tissues are intact. Calcification in the left side of the pelvis, likely fibroid. Vascular calcifications noted within the left thigh. IMPRESSION: No acute bony abnormality. Electronically Signed   By: Charlett Nose M.D.   On: 07/03/2015 21:18       Assessment and plan per attending neurologist  Felicie Morn PA-C [facility] [phone]  07/04/2015, 9:38 AM  She has significant weakness of knee extension, knee flexion, hip abduction, ankle plantarflexion, dorsiflexion, inversion and eversion of the left leg, relatively preserved on the right  Assessment/Plan:  65 YO with progressive left leg pain and weakness. It is unclear to me how much of the proximal weakness is due to deconditioning versus related to her underlying process. She has involvement of multiple distributions including the femoral nerve, sciatic nerve. It is possible that this is an isolated sciatic neuropathy with deconditioning resulting in weakness of the knee extensors.  Recommend: 1) MRI lumbar spine.  2) MRI to look at the lumbosacral plexus 3) MRI thoracic spine 4) if all of these images are negative, then further workup would be with EMG which would have to be done as an outpatient.  Ritta Slot, MD [facility] [phone]  If 7pm- 7am, please page neurology on call as listed in AMION.

## 2015-07-04 NOTE — Progress Notes (Signed)
VASCULAR LAB PRELIMINARY  PRELIMINARY  PRELIMINARY  PRELIMINARY  Left lower extremity venous duplex completed.     Left:  No evidence of DVT, superficial thrombosis, or Baker's cyst.   Jenetta Logesami Arlester Keehan, RVT, RDMS 07/04/2015, 10:01 AM

## 2015-07-04 NOTE — Plan of Care (Signed)
Signout note    Cassandra Stewart, is a 65 y.o. female, DOB - [birth year], MRN:[id number]   65 year old female admitted few hrs ago with chief complaints of acute on chronic left more than right leg weakness, symptoms started relatively suddenly around 6 weeks ago, according to the patient she had workup at [facility] which was unremarkable and she was sent to an SNF without much improvement, no matching records were located at [facility], she also says that she had a MRI of her back which was unremarkable.   X-ray of the L-spine, pelvis, left knee and left hip unremarkable. There is slight left tibial aversion noted on CT left knee but she is not tender in the left knee area, we will continue to monitor. I discussed with orthopedics on call Dr Linna Caprice who thinks that the CT left knee findings on the tibia are chronic and likely a bone spur. He recommends PT and supportive care.  Neurology has been consulted, PT eval, symptoms have been ongoing for at least 6 weeks. We'll defer further management to neurology.  She also has underlying insulin-dependent diabetes, CK D stage III and hypertension for which present care will be continued.   Filed Vitals:   07/04/15 0045 07/04/15 0139 07/04/15 0512 07/04/15 0948  BP: 135/57 146/55 138/58 146/56  Pulse: 82 78 73 72  Temp:  98.4 F (36.9 C) 98 F (36.7 C) 98.2 F (36.8 C)  TempSrc:  Oral Oral Oral  Resp:  Height:  5' 4.5" (1.638 m)    Weight:  117.2 kg (258 lb 6.1 oz)    SpO2: 96% 100% 96% 96%        Data Review   Micro Results Recent Results (from the past 240 hour(s))  Urine culture     Status: None (Preliminary result)   Collection Time: 07/03/15  7:41 PM  Result Value Ref Range Status   Specimen Description URINE, CLEAN CATCH  Final   Special Requests NONE  Final   Culture TOO YOUNG TO READ  Final   Report Status PENDING  Incomplete    Radiology  Reports Dg Chest 2 View  07/03/2015  CLINICAL DATA:  Recent fall. EXAM: CHEST  2 VIEW COMPARISON:  None. FINDINGS: Two views of the chest were obtained. Lungs are clear without airspace disease, pulmonary edema or large pneumothorax. Haziness in the left lower chest probably related to overlying soft tissues. No large pleural effusions. No acute bone abnormalities. IMPRESSION: No active cardiopulmonary disease. Electronically Signed   By: Richarda Overlie M.D.   On: 07/03/2015 21:19   Dg Lumbar Spine Complete  07/03/2015  CLINICAL DATA:  65 year old female with fall and left leg pain. EXAM: LUMBAR SPINE - COMPLETE 4+ VIEW COMPARISON:  None. FINDINGS: There is no acute fracture or subluxation of the lumbar spine. The vertebral body heights and disc spaces are maintained. The visualized transverse and spinous processes are intact. The soft tissues are grossly unremarkable. A small amorphous calcific density in the left hemipelvis most compatible with a calcified fibroid. IMPRESSION: No acute/traumatic lumbar spine pathology. Electronically Signed   By: Elgie Collard M.D.   On: 07/03/2015 21:19   Ct Knee Left Wo Contrast  07/04/2015  CLINICAL DATA:  Larey Seat about a week ago. EXAM: CT OF THE left KNEE WITHOUT CONTRAST TECHNIQUE: Multidetector CT imaging of the left knee was performed according to the standard protocol. Multiplanar CT image reconstructions were also generated. COMPARISON:  Radiographs 07/03/2015 FINDINGS: There is no tibial plateau fracture.  There is slight fragmentation of the lateral tibial spine which may represent a recent avulsion. There is no significant effusion. There is mild prepatellar soft tissue edema. IMPRESSION: No tibial plateau fracture. Slight fragmentation of the lateral tibial spine might represent a recent avulsion injury. Electronically Signed   By: Ellery Plunk M.D.   On: 07/04/2015 01:00   Dg Shoulder Left  07/03/2015  CLINICAL DATA:  Fall yesterday and today. Left shoulder  pain and decreased range of motion. Initial encounter. EXAM: LEFT SHOULDER - 2+ VIEW COMPARISON:  None. FINDINGS: There is no evidence of fracture or dislocation. Mild acromioclavicular degenerative spurring noted. No other bone lesions identified. Soft tissues are unremarkable. IMPRESSION: No acute findings.  Mild acromioclavicular DJD. Electronically Signed   By: Myles Rosenthal M.D.   On: 07/03/2015 21:19   Dg Knee Complete 4 Views Left  07/03/2015  CLINICAL DATA:  Recurrent falls during past 2 days. Left knee pain and limited mobility. Initial encounter. EXAM: LEFT KNEE - COMPLETE 4+ VIEW COMPARISON:  None. FINDINGS: Diffuse soft tissue swelling noted. A tiny avulsion fracture fragment is seen from the lateral tibial spine. No other fractures are identified. Alignment is normal. No evidence of knee joint effusion. IMPRESSION: Diffuse soft tissue swelling. Tiny avulsion fracture fragment from the lateral tibial spine. Electronically Signed   By: Myles Rosenthal M.D.   On: 07/03/2015 21:21   Dg Hip Unilat With Pelvis 2-3 Views Left  07/03/2015  CLINICAL DATA:  65 year old female with fall and left leg pain. EXAM: DG HIP (WITH OR WITHOUT PELVIS) 2-3V LEFT COMPARISON:  None. FINDINGS: There is no acute fracture or dislocation. No significant degenerative changes. A 1.8 x 2.4 cm amorphous calcific density in the left hemipelvis most likely represents a fibroid. The soft tissues appear unremarkable. IMPRESSION: No fracture or dislocation. Electronically Signed   By: Elgie Collard M.D.   On: 07/03/2015 21:17   Dg Femur Min 2 Views Left  07/03/2015  CLINICAL DATA: Fall On Sunday and Monday.  Left leg pain. EXAM: LEFT FEMUR 2 VIEWS COMPARISON:  None. FINDINGS: No acute bony abnormality. Specifically, no fracture, subluxation, or dislocation. Soft tissues are intact. Calcification in the left side of the pelvis, likely fibroid. Vascular calcifications noted within the left thigh. IMPRESSION: No acute bony abnormality.  Electronically Signed   By: Charlett Nose M.D.   On: 07/03/2015 21:18    CBC  Recent Labs Lab 07/03/15 2202 07/04/15 0735  WBC 6.6 6.1  HGB 12.3 11.4*  HCT 38.9 36.2  PLT 327 277  MCV 90.9 91.6  MCH 28.7 28.9  MCHC 31.6 31.5  RDW 14.0 13.9  LYMPHSABS 2.9  --   MONOABS 0.6  --   EOSABS 0.3  --   BASOSABS 0.0  --     Chemistries   Recent Labs Lab 07/03/15 2202 07/04/15 0735  NA 140 141  K 4.5 4.5  CL 102 106  CO2 25 24  GLUCOSE 180* 271*  BUN 19 21*  CREATININE 1.27* 1.24*  1.23*  CALCIUM 10.0 9.5  AST 66* 59*  ALT 58* 53  ALKPHOS 70 62  BILITOT 0.7 0.6   ------------------------------------------------------------------------------------------------------------------ estimated creatinine clearance is 57.1 mL/min (by C-G formula based on Cr of 1.23). ------------------------------------------------------------------------------------------------------------------ No results for input(s): HGBA1C in the last 72 hours. ------------------------------------------------------------------------------------------------------------------ No results for input(s): CHOL, HDL, LDLCALC, TRIG, CHOLHDL, LDLDIRECT in the last 72 hours. ------------------------------------------------------------------------------------------------------------------ No results for input(s): TSH, T4TOTAL, T3FREE, THYROIDAB in the last 72 hours.  Invalid input(s):  FREET3 ------------------------------------------------------------------------------------------------------------------ No results for input(s): VITAMINB12, FOLATE, FERRITIN, TIBC, IRON, RETICCTPCT in the last 72 hours.  Coagulation profile No results for input(s): INR, PROTIME in the last 168 hours.  No results for input(s): DDIMER in the last 72 hours.  Cardiac Enzymes No results for input(s): CKMB, TROPONINI, MYOGLOBIN in the last 168 hours.  Invalid input(s):  CK ------------------------------------------------------------------------------------------------------------------ Invalid input(s): POCBNP   Signature  Susa RaringSINGH,Vansh Reckart K M.D on 07/04/2015 at 12:03 PM  Between 7am to 7pm - Pager - [phone], After 7pm go to [email or web address] - password [email or web address]  [facility]  - Office  [phone]

## 2015-07-04 NOTE — ED Notes (Signed)
Patient transported to CT 

## 2015-07-04 NOTE — Progress Notes (Signed)
Patient arrived on unit via stretcher with ED nurse tech. Patient alert and oriented x4. Patient oriented to room, staff and unit. Patient's skin assessment completed with Nancy MarusNikki Murphy, RN, no skin issues noted. Patient's IV clean, dry and intact. Patient rates pain 4/10. Safety Fall Prevention Plan was given, discussed and signed by patient. Call light has been placed within reach and bed alarm has been activated. RN will continue to monitor patient.   Rivka BarbaraZenab Laquilla Dault BSN, RN  Phone Number: [phone]

## 2015-07-04 NOTE — Progress Notes (Signed)
PT Cancellation Note  Patient Details Name: Cassandra StalkerBarbara Burggraf MRN: [id number] DOB: [birth year]   Cancelled Treatment:    Reason Eval/Treat Not Completed: Patient at procedure or test/unavailable   Will follow up later today as time allows;  Otherwise, will follow up for PT tomorrow;   Thank you,  Van ClinesHolly Colene Mines, PT  Acute Rehabilitation Services Pager [phone] Office [phone]     Van ClinesGarrigan, Wiley Flicker [facility] Centeramff 07/04/2015, 9:39 AM

## 2015-07-04 NOTE — H&P (Signed)
History and Physical  Patient Name: Cassandra Stewart     ZOX:[id number]    DOB: 1950/11/30    DOA: 07/03/2015 Referring provider: Dereck Leep, MD PCP: Phyllis Ginger, MD  Outpatient specialists:  None      Patient coming from: Home  Chief Complaint: Leg weakness  HPI: Cassandra Stewart is a 65 y.o. female with a past medical history significant for IDDM, MO, and HTN who presents with fall from leg weakness and inability to stand.  The patient was in her usual state of health until mid-February when she had somewhat abrupt onset of left leg weakness.  She was hospitalized 4 days at [facility], discharged to home.  She failed at home, fell repeatedly, and was admitted for 1 week with UTI, AKI, and LEFT leg weakness.  During that hospitalization, she had an MRI of the lumbar spine and LEFT knee she believes, that were both normal.  She never had any diagnosis for her left leg weakness, but was discharged to SNF at the [facility] for acute rehab.    She was at the [facility] for 21 days, and then transferred to the [facility] for 21 more days, and was just discharged to a new apartment here in [place] (she had been living in [place], but has moved here to be near her daughter), two days ago.  While she was previously ambulatory before this started, since the last two months she has not been able to walk, and is still wheelchair bound at present.  At her home over the last two days, she has fallen during wheelchair transfers (like on to the commode) twice, and so today she came to the ER.  There has been no progression of her weakness.  There is no numbness.  She feels ankle pain from twisting her ankle while in rehab.  She has "hip pain" in the posterior outer buttock.  She has noted swelling of both the joints and the whole leg on the LEFT.    In the ED, she was afebrile and hemodynamically stable.  Na 140, K 4.5, Cr 1.27 (no previous), BG 180.  LFTs slightly elevated, WBC 6.6K, UA showed  bacteria and nitrites.  Radiographs of the LEFT hip, femur, shoulder and lumbar spine were normal.  Radiograph of the left knee showed possible avulsion fracture, which was also imaged on CT LEFT knee.  Social work were consulted from the ER, but were unable to ascertain appropriate placement for the patient, who was unable to stand.  She was given Ceftriaxone for possible UTI and TRH were asked to evaluate for observation.  In the last two days, she has been confined to her recliner because she cannot walk.  She has been unable to feed herself and has been urinating in the chair because she couldn't get up.    Review of Systems:  Pt complains of leg weakness, whole leg pain, worse at the ankle, also noted at the left hip/flank. Pt denies any dysuria, hematuria, urinary urgency, urinary frequency.  All other systems negative except as just noted or noted in the history of present illness.    Past Medical History  Diagnosis Date  . Diabetes mellitus without complication (HCC)   . Hypertension   . Hyperlipemia     Past Surgical History  Procedure Laterality Date  . Appendectomy  1969  . Lumbar disc surgery      Social History: Patient lives alone, recently moved to [place].  Before her illness, she lived in [place],  was able to walk without difficulty and was independent with all ADLs and IADLs.  She does not smoke.     No Known Allergies  Family history: family history includes Diabetes in her mother; Heart disease in her mother; Kidney disease in her brother.  No family history of neurological disease.  Prior to Admission medications   Medication Sig Start Date End Date Taking? Authorizing Provider  amLODipine (NORVASC) 10 MG tablet Take 10 mg by mouth daily. 07/02/15  Yes Historical Provider, MD  aspirin EC 81 MG tablet Take 81 mg by mouth every evening.   Yes Historical Provider, MD  Cholecalciferol (VITAMIN D PO) Take 1 tablet by mouth daily.   Yes Historical Provider, MD    gabapentin (NEURONTIN) 300 MG capsule Take 300 mg by mouth 3 (three) times daily. 07/02/15  Yes Historical Provider, MD  HUMALOG KWIKPEN 100 UNIT/ML KiwkPen Inject 18 Units into the skin 3 (three) times daily with meals. 07/02/15  Yes Historical Provider, MD  hydrochlorothiazide (HYDRODIURIL) 25 MG tablet Take 25 mg by mouth daily. 06/01/15  Yes Historical Provider, MD  lisinopril (PRINIVIL,ZESTRIL) 10 MG tablet Take 10 mg by mouth daily. 07/02/15  Yes Historical Provider, MD  lovastatin (MEVACOR) 40 MG tablet Take 40 mg by mouth every evening. 07/02/15  Yes Historical Provider, MD  meloxicam (MOBIC) 7.5 MG tablet Take 7.5 mg by mouth daily. 07/02/15  Yes Historical Provider, MD  metFORMIN (GLUCOPHAGE) 500 MG tablet Take 500 mg by mouth 2 (two) times daily. 07/02/15  Yes Historical Provider, MD  TOUJEO SOLOSTAR 300 UNIT/ML SOPN Inject 45 Units into the skin every morning. 07/03/15  Yes Historical Provider, MD       Physical Exam: BP 146/55 mmHg  Pulse 78  Temp(Src) 98.4 F (36.9 C) (Oral)  Resp 18  SpO2 100% General appearance: Well-developed, obese adult female, alert and in no acute distress.   Eyes: Anicteric, conjunctiva pink, lids and lashes normal.     ENT: No nasal deformity, discharge, or epistaxis.  OP moist without lesions.   Lymph: No cervical or supraclavicular lymphadenopathy. Skin: Warm and dry.  No jaundice.  No suspicious rashes or lesions. Cardiac: RRR, nl S1-S2, no murmurs appreciated.  Capillary refill is brisk.  JVP not visible.  1+ LE edema to shin.  Radial and DP pulses 2+ and symmetric. Respiratory: Normal respiratory rate and rhythm.  CTAB without rales or wheezes. Abdomen: Abdomen soft without rigidity.  No TTP. No ascites, distension.   MSK: No deformities or effusions in the left leg.  There is tenderness over the internal aspect of LEFT ankle, and pain with passive ROM.  There is no pain with LEFT SLR or with left knee flexion.  The patient indicates pain at the left PSIS,  but not tenderness to palpation, and no pain over LEFT hip greater trochanter.   Neuro: Cranial nerves 3-12 intact.  Sensorium intact and responding to questions, attention normal.  Speech is fluent.  Upper extremity strength 5/5 and symmetric.  RIGHT leg 4/5 at RIGHT hip flexion, 5/5 flexion/extension of RIGHT knee, and 5/5 dorsi/plantarflexion RIGHT ankle.  3/5 of LEFT hip flexion, knee extension/flexion, and ankle, question effort.    Psych: Behavior appropriate.  Affect normal.  No evidence of aural or visual hallucinations or delusions.       Labs on Admission:  I have personally reviewed following labs and imaging studies: CBC:  Recent Labs Lab 07/03/15 2202  WBC 6.6  NEUTROABS 2.8  HGB 12.3  HCT 38.9  MCV 90.9  PLT 327   Basic Metabolic Panel:  Recent Labs Lab 07/03/15 2202  NA 140  K 4.5  CL 102  CO2 25  GLUCOSE 180*  BUN 19  CREATININE 1.27*  CALCIUM 10.0   GFR: CrCl cannot be calculated (Unknown ideal weight.). Liver Function Tests:  Recent Labs Lab 07/03/15 2202  AST 66*  ALT 58*  ALKPHOS 70  BILITOT 0.7  PROT 7.2  ALBUMIN 3.6   No results for input(s): LIPASE, AMYLASE in the last 168 hours. No results for input(s): AMMONIA in the last 168 hours. Coagulation Profile: No results for input(s): INR, PROTIME in the last 168 hours. Cardiac Enzymes: No results for input(s): CKTOTAL, CKMB, CKMBINDEX, TROPONINI in the last 168 hours. BNP (last 3 results) No results for input(s): PROBNP in the last 8760 hours. HbA1C: No results for input(s): HGBA1C in the last 72 hours. CBG:  Recent Labs Lab 07/04/15 0156  GLUCAP 171*   Lipid Profile: No results for input(s): CHOL, HDL, LDLCALC, TRIG, CHOLHDL, LDLDIRECT in the last 72 hours. Thyroid Function Tests: No results for input(s): TSH, T4TOTAL, FREET4, T3FREE, THYROIDAB in the last 72 hours. Anemia Panel: No results for input(s): VITAMINB12, FOLATE, FERRITIN, TIBC, IRON, RETICCTPCT in the last 72  hours. Urine analysis:    Component Value Date/Time   COLORURINE AMBER* 07/03/2015 1941   APPEARANCEUR TURBID* 07/03/2015 1941   LABSPEC 1.025 07/03/2015 1941   PHURINE 5.0 07/03/2015 1941   GLUCOSEU 250* 07/03/2015 1941   HGBUR TRACE* 07/03/2015 1941   BILIRUBINUR SMALL* 07/03/2015 1941   KETONESUR 15* 07/03/2015 1941   PROTEINUR 30* 07/03/2015 1941   NITRITE POSITIVE* 07/03/2015 1941   LEUKOCYTESUR SMALL* 07/03/2015 1941   Sepsis Labs: @LABRCNTIP (procalcitonin:4,lacticidven:4) )No results found for this or any previous visit (from the past 240 hour(s)).       Radiological Exams on Admission: Personally reviewed: Dg Chest 2 View  07/03/2015  CLINICAL DATA:  Recent fall. EXAM: CHEST  2 VIEW COMPARISON:  None. FINDINGS: Two views of the chest were obtained. Lungs are clear without airspace disease, pulmonary edema or large pneumothorax. Haziness in the left lower chest probably related to overlying soft tissues. No large pleural effusions. No acute bone abnormalities. IMPRESSION: No active cardiopulmonary disease. Electronically Signed   By: Richarda OverlieAdam  Henn M.D.   On: 07/03/2015 21:19   Dg Lumbar Spine Complete  07/03/2015  CLINICAL DATA:  65 year old female with fall and left leg pain. EXAM: LUMBAR SPINE - COMPLETE 4+ VIEW COMPARISON:  None. FINDINGS: There is no acute fracture or subluxation of the lumbar spine. The vertebral body heights and disc spaces are maintained. The visualized transverse and spinous processes are intact. The soft tissues are grossly unremarkable. A small amorphous calcific density in the left hemipelvis most compatible with a calcified fibroid. IMPRESSION: No acute/traumatic lumbar spine pathology. Electronically Signed   By: Elgie CollardArash  Radparvar M.D.   On: 07/03/2015 21:19   Ct Knee Left Wo Contrast  07/04/2015  CLINICAL DATA:  Larey SeatFell about a week ago. EXAM: CT OF THE left KNEE WITHOUT CONTRAST TECHNIQUE: Multidetector CT imaging of the left knee was performed according to  the standard protocol. Multiplanar CT image reconstructions were also generated. COMPARISON:  Radiographs 07/03/2015 FINDINGS: There is no tibial plateau fracture. There is slight fragmentation of the lateral tibial spine which may represent a recent avulsion. There is no significant effusion. There is mild prepatellar soft tissue edema. IMPRESSION: No tibial plateau fracture. Slight fragmentation of the lateral tibial spine might  represent a recent avulsion injury. Electronically Signed   By: Ellery Plunk M.D.   On: 07/04/2015 01:00   Dg Shoulder Left  07/03/2015  CLINICAL DATA:  Fall yesterday and today. Left shoulder pain and decreased range of motion. Initial encounter. EXAM: LEFT SHOULDER - 2+ VIEW COMPARISON:  None. FINDINGS: There is no evidence of fracture or dislocation. Mild acromioclavicular degenerative spurring noted. No other bone lesions identified. Soft tissues are unremarkable. IMPRESSION: No acute findings.  Mild acromioclavicular DJD. Electronically Signed   By: Myles Rosenthal M.D.   On: 07/03/2015 21:19   Dg Knee Complete 4 Views Left  07/03/2015  CLINICAL DATA:  Recurrent falls during past 2 days. Left knee pain and limited mobility. Initial encounter. EXAM: LEFT KNEE - COMPLETE 4+ VIEW COMPARISON:  None. FINDINGS: Diffuse soft tissue swelling noted. A tiny avulsion fracture fragment is seen from the lateral tibial spine. No other fractures are identified. Alignment is normal. No evidence of knee joint effusion. IMPRESSION: Diffuse soft tissue swelling. Tiny avulsion fracture fragment from the lateral tibial spine. Electronically Signed   By: Myles Rosenthal M.D.   On: 07/03/2015 21:21   Dg Hip Unilat With Pelvis 2-3 Views Left  07/03/2015  CLINICAL DATA:  65 year old female with fall and left leg pain. EXAM: DG HIP (WITH OR WITHOUT PELVIS) 2-3V LEFT COMPARISON:  None. FINDINGS: There is no acute fracture or dislocation. No significant degenerative changes. A 1.8 x 2.4 cm amorphous calcific  density in the left hemipelvis most likely represents a fibroid. The soft tissues appear unremarkable. IMPRESSION: No fracture or dislocation. Electronically Signed   By: Elgie Collard M.D.   On: 07/03/2015 21:17   Dg Femur Min 2 Views Left  07/03/2015  CLINICAL DATA: Fall On Sunday and Monday.  Left leg pain. EXAM: LEFT FEMUR 2 VIEWS COMPARISON:  None. FINDINGS: No acute bony abnormality. Specifically, no fracture, subluxation, or dislocation. Soft tissues are intact. Calcification in the left side of the pelvis, likely fibroid. Vascular calcifications noted within the left thigh. IMPRESSION: No acute bony abnormality. Electronically Signed   By: Charlett Nose M.D.   On: 07/03/2015 21:18    EKG: Independently reviewed. Rate 77, sinus rhythm.  QTc 408, no ST or T wave changes, no ischemic changes.    Assessment/Plan 1. Inability to walk and leg weakness:  Etiology unclear.  This was a sudden onset (she can mostly pinpoint when she first noticed leg weakness) left leg weakness without consistent pain and with normal MRI brain, lumbar spine, and knee.  She has had no progression of her weakness to other areas or other neurologic deficits.  She has had no improvement with PT while in rehab for 6 weeks.  She is currently confined to a wheelchair, and was previously able to walk and drive.    It seems that stroke and radiculopathy has been ruled out given her reportedly normal brain and lumbar spine MRI, although this should be verified.  Other possible diagnostic considerations could include MS, epidural spinal abscess, although these are both doubted.  Transverse myelitis is doubted because of unilateral symptoms. -PT evaluation tomorrow -Obtain [facility] and the [facility] and Neurology consultation/referral pending these reports   2. IDDM:  -Continue home Tuojeo -Sliding scale corrections -Hold home metformin while inpatient  3. CKD stage III:    No baseline  4. HTN:  -Continue home amlodipine, HCTZ, ASA, lisinopril, statin  5. Elevated LFTs:  Likely hepatic steatosis.  -Repeat LFT  as outpatient with new PCP  6. Bacteriuria:  Possible contamination.  Patient without symptoms.  Had one dose of ceftriaxone in ER. -Will defer antibiotics unless patient develops symptoms -Follow urine culture     DVT prophylaxis: Lovenox  Code Status: FULL  Family Communication: None present  Disposition Plan: Anticipate observation overnight, evaluation by PT tomorrow and continued coordination of care by Social Work with the [employer] regarding possible placement.  If not able to place tomorrow, likely home with Home Health PT tomorrow. Consults called: Social work Hotel manager making: Patient seen at 2:56 AM on 07/04/2015.  The patient was discussed with Dr. Mora Bellman. I recommend admission to medical surgical unit, observation status.  Clinical condition: stable.      Alberteen Sam [facility] Pager [phone]

## 2015-07-05 DIAGNOSIS — R29898 Other symptoms and signs involving the musculoskeletal system: Secondary | ICD-10-CM | POA: Diagnosis not present

## 2015-07-05 DIAGNOSIS — R262 Difficulty in walking, not elsewhere classified: Secondary | ICD-10-CM | POA: Diagnosis not present

## 2015-07-05 DIAGNOSIS — Z794 Long term (current) use of insulin: Secondary | ICD-10-CM | POA: Diagnosis not present

## 2015-07-05 DIAGNOSIS — E1140 Type 2 diabetes mellitus with diabetic neuropathy, unspecified: Secondary | ICD-10-CM | POA: Diagnosis not present

## 2015-07-05 DIAGNOSIS — N390 Urinary tract infection, site not specified: Secondary | ICD-10-CM

## 2015-07-05 DIAGNOSIS — N189 Chronic kidney disease, unspecified: Secondary | ICD-10-CM | POA: Diagnosis not present

## 2015-07-05 DIAGNOSIS — I10 Essential (primary) hypertension: Secondary | ICD-10-CM | POA: Diagnosis not present

## 2015-07-05 DIAGNOSIS — R7989 Other specified abnormal findings of blood chemistry: Secondary | ICD-10-CM | POA: Diagnosis not present

## 2015-07-05 LAB — GLUCOSE, CAPILLARY
GLUCOSE-CAPILLARY: 211 mg/dL — AB (ref 65–99)
GLUCOSE-CAPILLARY: 233 mg/dL — AB (ref 65–99)
GLUCOSE-CAPILLARY: 203 mg/dL — AB (ref 65–99)
Glucose-Capillary: 178 mg/dL — ABNORMAL HIGH (ref 65–99)

## 2015-07-05 LAB — HEMOGLOBIN A1C
HEMOGLOBIN A1C: 9.7 % — AB (ref 4.8–5.6)
MEAN PLASMA GLUCOSE: 232 mg/dL

## 2015-07-05 MED ORDER — INSULIN ASPART 100 UNIT/ML ~~LOC~~ SOLN
5.0000 [IU] | Freq: Three times a day (TID) | SUBCUTANEOUS | Status: DC
  Administered 2015-07-05 – 2015-07-07 (×3): 5 [IU] via SUBCUTANEOUS

## 2015-07-05 MED ORDER — INSULIN GLARGINE 100 UNIT/ML ~~LOC~~ SOLN
50.0000 [IU] | Freq: Every day | SUBCUTANEOUS | Status: DC
  Filled 2015-07-05: qty 0.5

## 2015-07-05 MED ORDER — DEXTROSE 5 % IV SOLN
1.0000 g | INTRAVENOUS | Status: DC
  Administered 2015-07-05 – 2015-07-07 (×3): 1 g via INTRAVENOUS
  Filled 2015-07-05 (×4): qty 10

## 2015-07-05 MED ORDER — INSULIN GLARGINE 100 UNIT/ML ~~LOC~~ SOLN
5.0000 [IU] | Freq: Once | SUBCUTANEOUS | Status: AC
  Administered 2015-07-05: 5 [IU] via SUBCUTANEOUS
  Filled 2015-07-05: qty 0.05

## 2015-07-05 NOTE — Clinical Social Work Note (Signed)
Per MD, patient not medially stable for discharge today. Disposition is pending further workup per MD note.  CSW will continue to monitor patient's progress and [facility].  Genelle BalVanessa Chinwe Lope, MSW, LCSW Licensed Clinical Social Worker Clinical Social Work Department [facility] [phone]

## 2015-07-05 NOTE — Progress Notes (Signed)
Inpatient Diabetes Program Recommendations  AACE/ADA: New Consensus Statement on Inpatient Glycemic Control (2015)  Target Ranges:  Prepandial:   less than 140 mg/dL      Peak postprandial:   less than 180 mg/dL (1-2 hours)      Critically ill patients:  140 - 180 mg/dL   Review of Glycemic ControlResults for Cassandra StalkerBLOOMFIELD, Curstin (MRN [id number]) as of 07/05/2015 12:11  Ref. Range 07/04/2015 18:28 07/04/2015 20:47 07/04/2015 22:32 07/05/2015 07:52 07/05/2015 11:13  Glucose-Capillary Latest Ref Range: 65-99 mg/dL 829237 (H) 562271 (H) 130248 (H) 211 (H) 233 (H)   Diabetes history: Type 2 diabetes Outpatient Diabetes medications: Metformin 500 mg bid, Toujeo 45 units q AM, Humalog 18 units tid with meals  Current orders for Inpatient glycemic control:  Lantus 45 units daily, Novolog moderate tid with meals and HS  Inpatient Diabetes Program Recommendations:    May consider increasing Lantus to 50 units daily and adding Novolog meal coverage 5 units tid with meals.  Thanks, Beryl MeagerJenny Haniyyah Sakuma, RN, BC-ADM Inpatient Diabetes Coordinator Pager [phone] (8a-5p)

## 2015-07-05 NOTE — Consult Note (Signed)
Reason for Consult:thoracic stenosis, bilateral lower extremity weakness Referring Physician: elgergawy, D  Cassandra Stewart is an 65 y.o. female.  HPI: whom was in her usual state of health until September of 2016. At that time her daughter noticed she would complain of pain in her lower back while standing or walking. Her mother is stoic, and would not complain to her children about her increasing weakness. Approximately 3 months ago, [name] started falling given the weakness in the lower extremities. This is when she could no longer hide her problem. This led to two trips to [facility] for evaluation, neither of which provided an etiology. She was sent to rehab, and has been wheelchair bound for at least 3 weeks. Upon presentation to [facility] two days ago she could not walk, was weaker still, and there was no explanation. MRI thoracic spine revealed cord signal and stenosis at T10/11 yesterday. I am consulted for a possible operative intervention. She denies bowel/bladder difficulties.   Past Medical History  Diagnosis Date  . Diabetes mellitus without complication ([place])   . Hypertension   . Hyperlipemia     Past Surgical History  Procedure Laterality Date  . Appendectomy  1969  . Lumbar disc surgery      Family History  Problem Relation Age of Onset  . Diabetes Mother   . Heart disease Mother   . Kidney disease Brother     Two brothers on ESRD    Social History:  reports that she has never smoked. She does not have any smokeless tobacco history on file. She reports that she does not drink alcohol or use illicit drugs.  Allergies: No Known Allergies  Medications: I have reviewed the patient's current medications.  Results for orders placed or performed during the hospital encounter of 07/03/15 (from the past 48 hour(s))  Urine culture     Status: Abnormal (Preliminary result)   Collection Time: 07/03/15  7:41 PM  Result Value Ref Range   Specimen Description URINE, CLEAN CATCH     Special Requests NONE    Culture >=100,000 COLONIES/mL ESCHERICHIA COLI (A)    Report Status PENDING   Urinalysis, Routine w reflex microscopic (not at [facility])     Status: Abnormal   Collection Time: 07/03/15  7:41 PM  Result Value Ref Range   Color, Urine AMBER (A) YELLOW    Comment: BIOCHEMICALS MAY BE AFFECTED BY COLOR   APPearance TURBID (A) CLEAR   Specific Gravity, Urine 1.025 1.005 - 1.030   pH 5.0 5.0 - 8.0   Glucose, UA 250 (A) NEGATIVE mg/dL   Hgb urine dipstick TRACE (A) NEGATIVE   Bilirubin Urine SMALL (A) NEGATIVE   Ketones, ur 15 (A) NEGATIVE mg/dL   Protein, ur 30 (A) NEGATIVE mg/dL   Nitrite POSITIVE (A) NEGATIVE   Leukocytes, UA SMALL (A) NEGATIVE  Urine microscopic-add on     Status: Abnormal   Collection Time: 07/03/15  7:41 PM  Result Value Ref Range   Squamous Epithelial / LPF 6-30 (A) NONE SEEN   WBC, UA 0-5 0 - 5 WBC/hpf   RBC / HPF 0-5 0 - 5 RBC/hpf   Bacteria, UA MANY (A) NONE SEEN  CBC with Differential     Status: None   Collection Time: 07/03/15 10:02 PM  Result Value Ref Range   WBC 6.6 4.0 - 10.5 K/uL   RBC 4.28 3.87 - 5.11 MIL/uL   Hemoglobin 12.3 12.0 - 15.0 g/dL   HCT 38.9 36.0 - 46.0 %  MCV 90.9 78.0 - 100.0 fL   MCH 28.7 26.0 - 34.0 pg   MCHC 31.6 30.0 - 36.0 g/dL   RDW 14.0 11.5 - 15.5 %   Platelets 327 150 - 400 K/uL   Neutrophils Relative % 42 %   Neutro Abs 2.8 1.7 - 7.7 K/uL   Lymphocytes Relative 44 %   Lymphs Abs 2.9 0.7 - 4.0 K/uL   Monocytes Relative 9 %   Monocytes Absolute 0.6 0.1 - 1.0 K/uL   Eosinophils Relative 4 %   Eosinophils Absolute 0.3 0.0 - 0.7 K/uL   Basophils Relative 1 %   Basophils Absolute 0.0 0.0 - 0.1 K/uL  Comprehensive metabolic panel     Status: Abnormal   Collection Time: 07/03/15 10:02 PM  Result Value Ref Range   Sodium 140 135 - 145 mmol/L   Potassium 4.5 3.5 - 5.1 mmol/L   Chloride 102 101 - 111 mmol/L   CO2 25 22 - 32 mmol/L   Glucose, Bld 180 (H) 65 - 99 mg/dL   BUN 19 6 - 20 mg/dL    Creatinine, Ser 1.27 (H) 0.44 - 1.00 mg/dL   Calcium 10.0 8.9 - 10.3 mg/dL   Total Protein 7.2 6.5 - 8.1 g/dL   Albumin 3.6 3.5 - 5.0 g/dL   AST 66 (H) 15 - 41 U/L   ALT 58 (H) 14 - 54 U/L   Alkaline Phosphatase 70 38 - 126 U/L   Total Bilirubin 0.7 0.3 - 1.2 mg/dL   GFR calc non Af Amer 43 (L) >60 mL/min   GFR calc Af Amer 50 (L) >60 mL/min    Comment: (NOTE) The eGFR has been calculated using the CKD EPI equation. This calculation has not been validated in all clinical situations. eGFR's persistently <60 mL/min signify possible Chronic Kidney Disease.    Anion gap 13 5 - 15  I-stat troponin, ED     Status: None   Collection Time: 07/03/15 10:07 PM  Result Value Ref Range   Troponin i, poc 0.00 0.00 - 0.08 ng/mL   Comment 3            Comment: Due to the release kinetics of cTnI, a negative result within the first hours of the onset of symptoms does not rule out myocardial infarction with certainty. If myocardial infarction is still suspected, repeat the test at appropriate intervals.   Glucose, capillary     Status: Abnormal   Collection Time: 07/04/15  1:56 AM  Result Value Ref Range   Glucose-Capillary 171 (H) 65 - 99 mg/dL  Hemoglobin A1c     Status: Abnormal   Collection Time: 07/04/15  7:35 AM  Result Value Ref Range   Hgb A1c MFr Bld 9.7 (H) 4.8 - 5.6 %    Comment: (NOTE)         Pre-diabetes: 5.7 - 6.4         Diabetes: >6.4         Glycemic control for adults with diabetes: <7.0    Mean Plasma Glucose 232 mg/dL    Comment: (NOTE) Performed At: [facility] [place], [place] [id number] Lindon Romp MD HT:[id number]   CBC     Status: Abnormal   Collection Time: 07/04/15  7:35 AM  Result Value Ref Range   WBC 6.1 4.0 - 10.5 K/uL   RBC 3.95 3.87 - 5.11 MIL/uL   Hemoglobin 11.4 (L) 12.0 - 15.0 g/dL   HCT 36.2 36.0 - 46.0 %  MCV 91.6 78.0 - 100.0 fL   MCH 28.9 26.0 - 34.0 pg   MCHC 31.5 30.0 - 36.0 g/dL   RDW 13.9 11.5 - 15.5 %    Platelets 277 150 - 400 K/uL  Creatinine, serum     Status: Abnormal   Collection Time: 07/04/15  7:35 AM  Result Value Ref Range   Creatinine, Ser 1.23 (H) 0.44 - 1.00 mg/dL   GFR calc non Af Amer 45 (L) >60 mL/min   GFR calc Af Amer 52 (L) >60 mL/min    Comment: (NOTE) The eGFR has been calculated using the CKD EPI equation. This calculation has not been validated in all clinical situations. eGFR's persistently <60 mL/min signify possible Chronic Kidney Disease.   Comprehensive metabolic panel     Status: Abnormal   Collection Time: 07/04/15  7:35 AM  Result Value Ref Range   Sodium 141 135 - 145 mmol/L   Potassium 4.5 3.5 - 5.1 mmol/L   Chloride 106 101 - 111 mmol/L   CO2 24 22 - 32 mmol/L   Glucose, Bld 271 (H) 65 - 99 mg/dL   BUN 21 (H) 6 - 20 mg/dL   Creatinine, Ser 1.24 (H) 0.44 - 1.00 mg/dL   Calcium 9.5 8.9 - 10.3 mg/dL   Total Protein 6.6 6.5 - 8.1 g/dL   Albumin 3.4 (L) 3.5 - 5.0 g/dL   AST 59 (H) 15 - 41 U/L   ALT 53 14 - 54 U/L   Alkaline Phosphatase 62 38 - 126 U/L   Total Bilirubin 0.6 0.3 - 1.2 mg/dL   GFR calc non Af Amer 44 (L) >60 mL/min   GFR calc Af Amer 51 (L) >60 mL/min    Comment: (NOTE) The eGFR has been calculated using the CKD EPI equation. This calculation has not been validated in all clinical situations. eGFR's persistently <60 mL/min signify possible Chronic Kidney Disease.    Anion gap 11 5 - 15  Glucose, capillary     Status: Abnormal   Collection Time: 07/04/15  8:52 AM  Result Value Ref Range   Glucose-Capillary 240 (H) 65 - 99 mg/dL  Glucose, capillary     Status: Abnormal   Collection Time: 07/04/15 11:44 AM  Result Value Ref Range   Glucose-Capillary 185 (H) 65 - 99 mg/dL  Glucose, capillary     Status: Abnormal   Collection Time: 07/04/15  6:28 PM  Result Value Ref Range   Glucose-Capillary 237 (H) 65 - 99 mg/dL  Glucose, capillary     Status: Abnormal   Collection Time: 07/04/15  8:47 PM  Result Value Ref Range    Glucose-Capillary 271 (H) 65 - 99 mg/dL  Glucose, capillary     Status: Abnormal   Collection Time: 07/04/15 10:32 PM  Result Value Ref Range   Glucose-Capillary 248 (H) 65 - 99 mg/dL  Glucose, capillary     Status: Abnormal   Collection Time: 07/05/15  7:52 AM  Result Value Ref Range   Glucose-Capillary 211 (H) 65 - 99 mg/dL  Glucose, capillary     Status: Abnormal   Collection Time: 07/05/15 11:13 AM  Result Value Ref Range   Glucose-Capillary 233 (H) 65 - 99 mg/dL    Dg Chest 2 View  07/03/2015  CLINICAL DATA:  Recent fall. EXAM: CHEST  2 VIEW COMPARISON:  None. FINDINGS: Two views of the chest were obtained. Lungs are clear without airspace disease, pulmonary edema or large pneumothorax. Haziness in the left lower chest probably  related to overlying soft tissues. No large pleural effusions. No acute bone abnormalities. IMPRESSION: No active cardiopulmonary disease. Electronically Signed   By: Markus Daft M.D.   On: 07/03/2015 21:19   Dg Lumbar Spine Complete  07/03/2015  CLINICAL DATA:  65 year old female with fall and left leg pain. EXAM: LUMBAR SPINE - COMPLETE 4+ VIEW COMPARISON:  None. FINDINGS: There is no acute fracture or subluxation of the lumbar spine. The vertebral body heights and disc spaces are maintained. The visualized transverse and spinous processes are intact. The soft tissues are grossly unremarkable. A small amorphous calcific density in the left hemipelvis most compatible with a calcified fibroid. IMPRESSION: No acute/traumatic lumbar spine pathology. Electronically Signed   By: Anner Crete M.D.   On: 07/03/2015 21:19   Ct Knee Left Wo Contrast  07/04/2015  CLINICAL DATA:  [place] about a week ago. EXAM: CT OF THE left KNEE WITHOUT CONTRAST TECHNIQUE: Multidetector CT imaging of the left knee was performed according to the standard protocol. Multiplanar CT image reconstructions were also generated. COMPARISON:  Radiographs 07/03/2015 FINDINGS: There is no tibial plateau  fracture. There is slight fragmentation of the lateral tibial spine which may represent a recent avulsion. There is no significant effusion. There is mild prepatellar soft tissue edema. IMPRESSION: No tibial plateau fracture. Slight fragmentation of the lateral tibial spine might represent a recent avulsion injury. Electronically Signed   By: Andreas Newport M.D.   On: 07/04/2015 01:00   Mr Thoracic Spine Wo Contrast  07/04/2015  CLINICAL DATA:  Progressive left leg pain and weakness. History of surgery L5-S1 herniated disc. EXAM: MRI THORACIC AND LUMBAR SPINE WITHOUT CONTRAST TECHNIQUE: Multiplanar and multiecho pulse sequences of the thoracic and lumbar spine were obtained without intravenous contrast. COMPARISON:  None. FINDINGS: MR THORACIC SPINE FINDINGS Image quality degraded by motion and patient size. Negative for fracture or mass lesion. Extensive multilevel spondylosis. There is disc degeneration and spurring throughout the thoracic spine. In addition, there is facet hypertrophy throughout the mid and lower thoracic spine. There is mild spinal stenosis at T5-6, T6-7, T7-8, T9-10. Moderately severe spinal stenosis at T10-11 due to vertebral spurring and facet hypertrophy. There is focal hyperintensity in the cord at this level which is ill-defined. This is compatible with myelomalacia which could be acute or chronic. Mild spinal stenosis at T11-T12. MR LUMBAR SPINE FINDINGS S1 is fully lumbarized. There are 6 lumbarized vertebral segments the lowest of which will be labeled S1. This is counting from skullbase down. This places the severe spinal stenosis and cord hyperintensity at T10-11. Normal lumbar alignment. Negative for fracture or mass lesion. Conus medullaris normal and terminates at approximately mid L3. No evidence of tethered cord or thickening of the filum terminale. Low lying conus medullaris felt to be related to an extra lumbar vertebral segment. L1-2:  Negative L2-3:  Negative L3-4:   Negative L4-5: Mild disc and mild facet degeneration causing mild spinal stenosis L5-S1: Disc and moderate facet degeneration.  Mild spinal stenosis S1-2: Postop changes on the left with epidural scarring. Disc degeneration and spondylosis without disc protrusion. 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 This is causing moderately severe spinal stenosis at T10-11 with focal cord hyperintensity and myelomalacia this level. Cord signal abnormality could be acute or chronic. Multiple additional levels of mild spinal stenosis as above MR LUMBAR SPINE IMPRESSION Six non-rib-bearing lumbar segments, the lowest disc space is labeled S1-2. There has been prior  surgery on the left at this level. Mild spinal stenosis at L4-5 and L5-S1. Electronically Signed   By: Franchot Gallo M.D.   On: 07/04/2015 17:43   Mr Lumbar Spine Wo Contrast  07/04/2015  CLINICAL DATA:  Progressive left leg pain and weakness. History of surgery L5-S1 herniated disc. EXAM: MRI THORACIC AND LUMBAR SPINE WITHOUT CONTRAST TECHNIQUE: Multiplanar and multiecho pulse sequences of the thoracic and lumbar spine were obtained without intravenous contrast. COMPARISON:  None. FINDINGS: MR THORACIC SPINE FINDINGS Image quality degraded by motion and patient size. Negative for fracture or mass lesion. Extensive multilevel spondylosis. There is disc degeneration and spurring throughout the thoracic spine. In addition, there is facet hypertrophy throughout the mid and lower thoracic spine. There is mild spinal stenosis at T5-6, T6-7, T7-8, T9-10. Moderately severe spinal stenosis at T10-11 due to vertebral spurring and facet hypertrophy. There is focal hyperintensity in the cord at this level which is ill-defined. This is compatible with myelomalacia which could be acute or chronic. Mild spinal stenosis at T11-T12. MR LUMBAR SPINE FINDINGS S1 is fully lumbarized. There  are 6 lumbarized vertebral segments the lowest of which will be labeled S1. This is counting from skullbase down. This places the severe spinal stenosis and cord hyperintensity at T10-11. Normal lumbar alignment. Negative for fracture or mass lesion. Conus medullaris normal and terminates at approximately mid L3. No evidence of tethered cord or thickening of the filum terminale. Low lying conus medullaris felt to be related to an extra lumbar vertebral segment. L1-2:  Negative L2-3:  Negative L3-4:  Negative L4-5: Mild disc and mild facet degeneration causing mild spinal stenosis L5-S1: Disc and moderate facet degeneration.  Mild spinal stenosis S1-2: Postop changes on the left with epidural scarring. Disc degeneration and spondylosis without disc protrusion. 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 This is causing moderately severe spinal stenosis at T10-11 with focal cord hyperintensity and myelomalacia this level. Cord signal abnormality could be acute or chronic. Multiple additional levels of mild spinal stenosis as above MR LUMBAR SPINE IMPRESSION Six non-rib-bearing lumbar segments, the lowest disc space is labeled S1-2. There has been prior surgery on the left at this level. Mild spinal stenosis at L4-5 and L5-S1. Electronically Signed   By: Franchot Gallo M.D.   On: 07/04/2015 17:43   Mr Sacrum/si Joints Wo Contrast  07/04/2015  CLINICAL DATA:  Left leg pain and weakness, progressive.  Diabetes. EXAM: MR SACRUM WITHOUT CONTRAST TECHNIQUE: Multiplanar, multisequence MR imaging was performed. No intravenous contrast was administered. COMPARISON:  07/03/2015 FINDINGS: There is edema tracking in the gluteus medius and minimus muscles, and to a lesser extent in the gluteus maximus, piriformis, and hip adductor musculature. There is also presacral edema. Given the generalized nature, I favor third spacing of fluid  over myositis. A creating kinase level could be utilized to assess for the less likely possibility of myositis. Trace left hip joint effusion. Upper normal amount of fluid in both sacroiliac joints but no overt joint effusion. The appearance is relatively bilaterally symmetric. There is no significant adjacent marrow edema or sclerosis. No erosion along the sacroiliac joints identified. No impingement to the sacral neural foramina. Proximal hamstring tendons appear intact. No discontinuity along the sacrum or coccyx, or compelling findings of sacral insufficiency fracture. IMPRESSION: 1. There is an appearance suggesting third spacing of fluid with presacral edema and edema in the regional musculature in a generalized fashion. Diffuse myositis is  a less likely differential diagnostic consideration, correlate with CK level. 2. Trace left hip joint effusion. No overt sacroiliac joint effusion, SI joint erosion, or other findings of sacroiliitis at this time. No sciatic notch impingement. Electronically Signed   By: Van Clines M.D.   On: 07/04/2015 19:06   Dg Shoulder Left  07/03/2015  CLINICAL DATA:  Fall yesterday and today. Left shoulder pain and decreased range of motion. Initial encounter. EXAM: LEFT SHOULDER - 2+ VIEW COMPARISON:  None. FINDINGS: There is no evidence of fracture or dislocation. Mild acromioclavicular degenerative spurring noted. No other bone lesions identified. Soft tissues are unremarkable. IMPRESSION: No acute findings.  Mild acromioclavicular DJD. Electronically Signed   By: Earle Gell M.D.   On: 07/03/2015 21:19   Dg Knee Complete 4 Views Left  07/03/2015  CLINICAL DATA:  Recurrent falls during past 2 days. Left knee pain and limited mobility. Initial encounter. EXAM: LEFT KNEE - COMPLETE 4+ VIEW COMPARISON:  None. FINDINGS: Diffuse soft tissue swelling noted. A tiny avulsion fracture fragment is seen from the lateral tibial spine. No other fractures are identified. Alignment is  normal. No evidence of knee joint effusion. IMPRESSION: Diffuse soft tissue swelling. Tiny avulsion fracture fragment from the lateral tibial spine. Electronically Signed   By: Earle Gell M.D.   On: 07/03/2015 21:21   Dg Hip Unilat With Pelvis 2-3 Views Left  07/03/2015  CLINICAL DATA:  65 year old female with fall and left leg pain. EXAM: DG HIP (WITH OR WITHOUT PELVIS) 2-3V LEFT COMPARISON:  None. FINDINGS: There is no acute fracture or dislocation. No significant degenerative changes. A 1.8 x 2.4 cm amorphous calcific density in the left hemipelvis most likely represents a fibroid. The soft tissues appear unremarkable. IMPRESSION: No fracture or dislocation. Electronically Signed   By: Anner Crete M.D.   On: 07/03/2015 21:17   Dg Femur Min 2 Views Left  07/03/2015  CLINICAL DATA: Fall On Sunday and Monday.  Left leg pain. EXAM: LEFT FEMUR 2 VIEWS COMPARISON:  None. FINDINGS: No acute bony abnormality. Specifically, no fracture, subluxation, or dislocation. Soft tissues are intact. Calcification in the left side of the pelvis, likely fibroid. Vascular calcifications noted within the left thigh. IMPRESSION: No acute bony abnormality. Electronically Signed   By: Rolm Baptise M.D.   On: 07/03/2015 21:18    Review of Systems  HENT: Negative.   Eyes: Negative.   Respiratory: Negative.   Cardiovascular: Positive for leg swelling.  Gastrointestinal: Negative.   Genitourinary: Negative.   Musculoskeletal: Positive for back pain and falls.  Skin: Negative.   Neurological: Positive for focal weakness and weakness.  Endo/Heme/Allergies: Negative.   Psychiatric/Behavioral: Negative.    Blood pressure 161/61, pulse 85, temperature 98.2 F (36.8 C), temperature source Oral, resp. rate 20, height 5' 4.5" (1.638 m), weight 122 kg (268 lb 15.4 oz), SpO2 98 %. Physical Exam  Constitutional: She is oriented to person, place, and time. She appears well-developed and well-nourished. No distress.  HENT:   Head: Normocephalic and atraumatic.  Right Ear: External ear normal.  Left Ear: External ear normal.  Nose: Nose normal.  Mouth/Throat: Oropharynx is clear and moist.  Eyes: Conjunctivae and EOM are normal. Pupils are equal, round, and reactive to light.  Neck: Normal range of motion. Neck supple.  Cardiovascular: Normal rate, regular rhythm, normal heart sounds and intact distal pulses.   Respiratory: Effort normal and breath sounds normal.  GI: Soft. Bowel sounds are normal.  Neurological: She is alert and oriented  to person, place, and time. She has normal reflexes. She displays normal reflexes. No cranial nerve deficit. She exhibits normal muscle tone. Coordination normal.  3/5 left lower extremity 4-/5 right lower extremity all muscle groups Proprioception intact, as is light touch in lower extremities Gait not assessed, did not have patient try to stand  Skin: Skin is warm and dry.  Psychiatric: She has a normal mood and affect. Her behavior is normal. Judgment and thought content normal.    Assessment/Plan: OR for thoracic stenosis T10/11. Agree with radiology that evaluation for possible myositis be undertaken. I am not sure that the stenosis explains her pain, as this is usually a painless anatomic problem. However since there will always be a question as to whether this stenosis is causing her problem I feel it is reasonable to decompress this area. Risks and benefits including, bleeding, infection, no improvement, paralysis, weakness, loss of bowel and or bowel control and other risks were discussed and explained. She understands and wishes to proceed.   Clarann Helvey L 07/05/2015, 4:22 PM

## 2015-07-05 NOTE — Progress Notes (Signed)
PROGRESS NOTE                                                                                                                                                                                                             Patient Demographics:    Cassandra Stewart, is a 65 y.o. female, DOB - 1950/10/09, NWG:[id number]  Admit date - 07/03/2015   Admitting Physician Alberteen Sam, MD  Outpatient Primary MD for the patient is Phyllis Ginger, MD  LOS -     Chief Complaint  Patient presents with  . Fall  . Weakness       Brief Narrative  65 year old female admittedwith chief complaints of acute on chronic left more than right leg weakness, symptoms started relatively suddenly around 6 weeks ago, according to the patient she had workup at [facility] which was unremarkable and she was sent to an SNF without much improvement, no matching records were located at [facility], she also says that she had a MRI of her back which was unremarkable.  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   Subjective:    [facility] today has, No headache, No chest pain, No abdominal pain , Post persistent left lower extremity weakness  X-ray of the L-spine, pelvis, left knee and left hip unremarkable. There is slight left tibial aversion noted on CT left knee but she is not tender in the left knee area, we will continue to monitor. Dr Bess Harvest  discussed with orthopedics on call Dr Linna Caprice who thinks that the CT left knee findings on the tibia are chronic and likely a bone spur. He recommends PT and supportive care.   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 - Neurosurgery consulted.  Diabetes mellitus -  Continue with Lantus, insulin sliding scale, will add NovoLog 5 units 3 times a day before meals   CKD stage III:  - No baseline   HTN:  - Continue home amlodipine, HCTZ, ASA, lisinopril, statin   Elevated LFTs:  - Likely hepatic steatosis.  - Repeat LFT as outpatient with new PCP   Bacteriuria - She is asymptomatic, but urine culture growing Escherichia coli more than 100,000 colonies, will start on Rocephin    Code Status : Full  Family Communication  : none at bedside  Disposition Plan  : Pending further workup  Consults  :  Neurology next neurosurgery  Procedures  : None  DVT Prophylaxis  :  Lovenox, will hold for possible need of neurosurgery, continue with SCD.  Lab Results  Component Value Date   PLT 277 07/04/2015    Antibiotics  :    Anti-infectives    Start     Dose/Rate Route Frequency Ordered Stop   07/04/15 0300  cephALEXin (KEFLEX) capsule 500 mg  Status:  Discontinued     500 mg Oral Every 12 hours 07/04/15 0256 07/04/15 0320   07/03/15 2115  cefTRIAXone (ROCEPHIN) 1 g in dextrose 5 % 50 mL IVPB     1 g 100 mL/hr over 30 Minutes Intravenous  Once 07/03/15 2106 07/04/15 0045        Objective:   Filed Vitals:   07/04/15 1700 07/04/15 2048 07/05/15 0423 07/05/15 0910  BP: 138/60 151/62 152/61 161/61  Pulse: 74 79 71 85  Temp: 98.4 F (36.9 C) 99.3 F (37.4 C) 98.7 F (37.1 C) 98.2 F (36.8 C)  TempSrc: Oral   Oral  Resp: 18 20 22 20   Height:      Weight:  122 kg (268 lb 15.4 oz)    SpO2: 94% 98% 97% 98%    Wt Readings from Last 3 Encounters:  07/04/15  122 kg (268 lb 15.4 oz)     Intake/Output Summary (Last 24 hours) at 07/05/15 1338 Last data filed at 07/05/15 0900  Gross per 24 hour  Intake    480 ml  Output    175 ml  Net    305 ml     Physical Exam  Awake Alert, Oriented X 3,  [place].AT,PERRAL Supple Neck,No JVD,  Symmetrical Chest wall movement, Good air movement bilaterally, CTAB RRR,No Gallops,Rubs or new Murmurs, No Parasternal Heave +ve B.Sounds, Abd Soft, No tenderness, No organomegaly appriciated, No rebound - guarding or rigidity. No Cyanosis, Clubbing or edema,left lower ext weakness    Data Review:    CBC  Recent Labs Lab 07/03/15 2202 07/04/15 0735  WBC 6.6 6.1  HGB 12.3 11.4*  HCT 38.9 36.2  PLT 327 277  MCV 90.9 91.6  MCH 28.7 28.9  MCHC 31.6 31.5  RDW 14.0 13.9  LYMPHSABS 2.9  --   MONOABS 0.6  --   EOSABS 0.3  --   BASOSABS 0.0  --     Chemistries   Recent Labs Lab 07/03/15 2202 07/04/15 0735  NA 140 141  K 4.5 4.5  CL 102 106  CO2 25 24  GLUCOSE 180* 271*  BUN 19 21*  CREATININE 1.27* 1.24*  1.23*  CALCIUM 10.0 9.5  AST 66* 59*  ALT 58* 53  ALKPHOS 70 62  BILITOT 0.7 0.6   ------------------------------------------------------------------------------------------------------------------ No results for input(s): CHOL, HDL, LDLCALC, TRIG, CHOLHDL, LDLDIRECT in the last 72 hours.  Lab Results  Component Value Date   HGBA1C 9.7* 07/04/2015   ------------------------------------------------------------------------------------------------------------------  No results for input(s): TSH, T4TOTAL, T3FREE, THYROIDAB in the last 72 hours.  Invalid input(s): FREET3 ------------------------------------------------------------------------------------------------------------------ No results for input(s): VITAMINB12, FOLATE, FERRITIN, TIBC, IRON, RETICCTPCT in the last 72 hours.  Coagulation profile No results for input(s): INR, PROTIME in the last 168 hours.  No results for  input(s): DDIMER in the last 72 hours.  Cardiac Enzymes No results for input(s): CKMB, TROPONINI, MYOGLOBIN in the last 168 hours.  Invalid input(s): CK ------------------------------------------------------------------------------------------------------------------ No results found for: BNP  Inpatient Medications  Scheduled Meds: . amLODipine  10 mg Oral Daily  . aspirin EC  81 mg Oral QPM  . enoxaparin (LOVENOX) injection  40 mg Subcutaneous Q24H  . hydrochlorothiazide  25 mg Oral Daily  . insulin aspart  0-15 Units Subcutaneous TID WC  . insulin aspart  0-5 Units Subcutaneous QHS  . insulin glargine  45 Units Subcutaneous Daily  . lisinopril  10 mg Oral Daily  . pravastatin  40 mg Oral q1800   Continuous Infusions:  PRN Meds:.acetaminophen **OR** acetaminophen, ondansetron **OR** ondansetron (ZOFRAN) IV  Micro Results Recent Results (from the past 240 hour(s))  Urine culture     Status: Abnormal (Preliminary result)   Collection Time: 07/03/15  7:41 PM  Result Value Ref Range Status   Specimen Description URINE, CLEAN CATCH  Final   Special Requests NONE  Final   Culture >=100,000 COLONIES/mL ESCHERICHIA COLI (A)  Final   Report Status PENDING  Incomplete    Radiology Reports Dg Chest 2 View  07/03/2015  CLINICAL DATA:  Recent fall. EXAM: CHEST  2 VIEW COMPARISON:  None. FINDINGS: Two views of the chest were obtained. Lungs are clear without airspace disease, pulmonary edema or large pneumothorax. Haziness in the left lower chest probably related to overlying soft tissues. No large pleural effusions. No acute bone abnormalities. IMPRESSION: No active cardiopulmonary disease. Electronically Signed   By: Richarda OverlieAdam  Henn M.D.   On: 07/03/2015 21:19   Dg Lumbar Spine Complete  07/03/2015  CLINICAL DATA:  65 year old female with fall and left leg pain. EXAM: LUMBAR SPINE - COMPLETE 4+ VIEW COMPARISON:  None. FINDINGS: There is no acute fracture or subluxation of the lumbar spine. The  vertebral body heights and disc spaces are maintained. The visualized transverse and spinous processes are intact. The soft tissues are grossly unremarkable. A small amorphous calcific density in the left hemipelvis most compatible with a calcified fibroid. IMPRESSION: No acute/traumatic lumbar spine pathology. Electronically Signed   By: Elgie CollardArash  Radparvar M.D.   On: 07/03/2015 21:19   Ct Knee Left Wo Contrast  07/04/2015  CLINICAL DATA:  Larey SeatFell about a week ago. EXAM: CT OF THE left KNEE WITHOUT CONTRAST TECHNIQUE: Multidetector CT imaging of the left knee was performed according to the standard protocol. Multiplanar CT image reconstructions were also generated. COMPARISON:  Radiographs 07/03/2015 FINDINGS: There is no tibial plateau fracture. There is slight fragmentation of the lateral tibial spine which may represent a recent avulsion. There is no significant effusion. There is mild prepatellar soft tissue edema. IMPRESSION: No tibial plateau fracture. Slight fragmentation of the lateral tibial spine might represent a recent avulsion injury. Electronically Signed   By: Ellery Plunkaniel R Mitchell M.D.   On: 07/04/2015 01:00   Mr Thoracic Spine Wo Contrast  07/04/2015  CLINICAL DATA:  Progressive left leg pain and weakness. History of surgery L5-S1 herniated disc. EXAM: MRI THORACIC AND LUMBAR SPINE WITHOUT CONTRAST TECHNIQUE: Multiplanar and multiecho pulse sequences of the thoracic and lumbar spine were obtained without  intravenous contrast. COMPARISON:  None. FINDINGS: MR THORACIC SPINE FINDINGS Image quality degraded by motion and patient size. Negative for fracture or mass lesion. Extensive multilevel spondylosis. There is disc degeneration and spurring throughout the thoracic spine. In addition, there is facet hypertrophy throughout the mid and lower thoracic spine. There is mild spinal stenosis at T5-6, T6-7, T7-8, T9-10. Moderately severe spinal stenosis at T10-11 due to vertebral spurring and facet hypertrophy.  There is focal hyperintensity in the cord at this level which is ill-defined. This is compatible with myelomalacia which could be acute or chronic. Mild spinal stenosis at T11-T12. MR LUMBAR SPINE FINDINGS S1 is fully lumbarized. There are 6 lumbarized vertebral segments the lowest of which will be labeled S1. This is counting from skullbase down. This places the severe spinal stenosis and cord hyperintensity at T10-11. Normal lumbar alignment. Negative for fracture or mass lesion. Conus medullaris normal and terminates at approximately mid L3. No evidence of tethered cord or thickening of the filum terminale. Low lying conus medullaris felt to be related to an extra lumbar vertebral segment. L1-2:  Negative L2-3:  Negative L3-4:  Negative L4-5: Mild disc and mild facet degeneration causing mild spinal stenosis L5-S1: Disc and moderate facet degeneration.  Mild spinal stenosis S1-2: Postop changes on the left with epidural scarring. Disc degeneration and spondylosis without disc protrusion. 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 This is causing moderately severe spinal stenosis at T10-11 with focal cord hyperintensity and myelomalacia this level. Cord signal abnormality could be acute or chronic. Multiple additional levels of mild spinal stenosis as above MR LUMBAR SPINE IMPRESSION Six non-rib-bearing lumbar segments, the lowest disc space is labeled S1-2. There has been prior surgery on the left at this level. Mild spinal stenosis at L4-5 and L5-S1. Electronically Signed   By: Marlan Palau M.D.   On: 07/04/2015 17:43   Mr Lumbar Spine Wo Contrast  07/04/2015  CLINICAL DATA:  Progressive left leg pain and weakness. History of surgery L5-S1 herniated disc. EXAM: MRI THORACIC AND LUMBAR SPINE WITHOUT CONTRAST TECHNIQUE: Multiplanar and multiecho pulse sequences of the thoracic and lumbar spine were obtained without  intravenous contrast. COMPARISON:  None. FINDINGS: MR THORACIC SPINE FINDINGS Image quality degraded by motion and patient size. Negative for fracture or mass lesion. Extensive multilevel spondylosis. There is disc degeneration and spurring throughout the thoracic spine. In addition, there is facet hypertrophy throughout the mid and lower thoracic spine. There is mild spinal stenosis at T5-6, T6-7, T7-8, T9-10. Moderately severe spinal stenosis at T10-11 due to vertebral spurring and facet hypertrophy. There is focal hyperintensity in the cord at this level which is ill-defined. This is compatible with myelomalacia which could be acute or chronic. Mild spinal stenosis at T11-T12. MR LUMBAR SPINE FINDINGS S1 is fully lumbarized. There are 6 lumbarized vertebral segments the lowest of which will be labeled S1. This is counting from skullbase down. This places the severe spinal stenosis and cord hyperintensity at T10-11. Normal lumbar alignment. Negative for fracture or mass lesion. Conus medullaris normal and terminates at approximately mid L3. No evidence of tethered cord or thickening of the filum terminale. Low lying conus medullaris felt to be related to an extra lumbar vertebral segment. L1-2:  Negative L2-3:  Negative L3-4:  Negative L4-5: Mild disc and mild facet degeneration causing mild spinal stenosis L5-S1: Disc and moderate facet degeneration.  Mild spinal stenosis S1-2: Postop changes on the left with epidural  scarring. Disc degeneration and spondylosis without disc protrusion. 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 This is causing moderately severe spinal stenosis at T10-11 with focal cord hyperintensity and myelomalacia this level. Cord signal abnormality could be acute or chronic. Multiple additional levels of mild spinal stenosis as above MR LUMBAR SPINE IMPRESSION Six non-rib-bearing lumbar segments, the  lowest disc space is labeled S1-2. There has been prior surgery on the left at this level. Mild spinal stenosis at L4-5 and L5-S1. Electronically Signed   By: Marlan Palau M.D.   On: 07/04/2015 17:43   Mr Sacrum/si Joints Wo Contrast  07/04/2015  CLINICAL DATA:  Left leg pain and weakness, progressive.  Diabetes. EXAM: MR SACRUM WITHOUT CONTRAST TECHNIQUE: Multiplanar, multisequence MR imaging was performed. No intravenous contrast was administered. COMPARISON:  07/03/2015 FINDINGS: There is edema tracking in the gluteus medius and minimus muscles, and to a lesser extent in the gluteus maximus, piriformis, and hip adductor musculature. There is also presacral edema. Given the generalized nature, I favor third spacing of fluid over myositis. A creating kinase level could be utilized to assess for the less likely possibility of myositis. Trace left hip joint effusion. Upper normal amount of fluid in both sacroiliac joints but no overt joint effusion. The appearance is relatively bilaterally symmetric. There is no significant adjacent marrow edema or sclerosis. No erosion along the sacroiliac joints identified. No impingement to the sacral neural foramina. Proximal hamstring tendons appear intact. No discontinuity along the sacrum or coccyx, or compelling findings of sacral insufficiency fracture. IMPRESSION: 1. There is an appearance suggesting third spacing of fluid with presacral edema and edema in the regional musculature in a generalized fashion. Diffuse myositis is a less likely differential diagnostic consideration, correlate with CK level. 2. Trace left hip joint effusion. No overt sacroiliac joint effusion, SI joint erosion, or other findings of sacroiliitis at this time. No sciatic notch impingement. Electronically Signed   By: Gaylyn Rong M.D.   On: 07/04/2015 19:06   Dg Shoulder Left  07/03/2015  CLINICAL DATA:  Fall yesterday and today. Left shoulder pain and decreased range of motion. Initial  encounter. EXAM: LEFT SHOULDER - 2+ VIEW COMPARISON:  None. FINDINGS: There is no evidence of fracture or dislocation. Mild acromioclavicular degenerative spurring noted. No other bone lesions identified. Soft tissues are unremarkable. IMPRESSION: No acute findings.  Mild acromioclavicular DJD. Electronically Signed   By: Myles Rosenthal M.D.   On: 07/03/2015 21:19   Dg Knee Complete 4 Views Left  07/03/2015  CLINICAL DATA:  Recurrent falls during past 2 days. Left knee pain and limited mobility. Initial encounter. EXAM: LEFT KNEE - COMPLETE 4+ VIEW COMPARISON:  None. FINDINGS: Diffuse soft tissue swelling noted. A tiny avulsion fracture fragment is seen from the lateral tibial spine. No other fractures are identified. Alignment is normal. No evidence of knee joint effusion. IMPRESSION: Diffuse soft tissue swelling. Tiny avulsion fracture fragment from the lateral tibial spine. Electronically Signed   By: Myles Rosenthal M.D.   On: 07/03/2015 21:21   Dg Hip Unilat With Pelvis 2-3 Views Left  07/03/2015  CLINICAL DATA:  65 year old female with fall and left leg pain. EXAM: DG HIP (WITH OR WITHOUT PELVIS) 2-3V LEFT COMPARISON:  None. FINDINGS: There is no acute fracture or dislocation. No significant degenerative changes. A 1.8 x 2.4 cm amorphous calcific density in the left hemipelvis most likely represents a fibroid. The soft tissues appear unremarkable. IMPRESSION:  No fracture or dislocation. Electronically Signed   By: Elgie Collard M.D.   On: 07/03/2015 21:17   Dg Femur Min 2 Views Left  07/03/2015  CLINICAL DATA: Fall On Sunday and Monday.  Left leg pain. EXAM: LEFT FEMUR 2 VIEWS COMPARISON:  None. FINDINGS: No acute bony abnormality. Specifically, no fracture, subluxation, or dislocation. Soft tissues are intact. Calcification in the left side of the pelvis, likely fibroid. Vascular calcifications noted within the left thigh. IMPRESSION: No acute bony abnormality. Electronically Signed   By: Charlett Nose M.D.    On: 07/03/2015 21:18    Time Spent in minutes  25   Nivin Braniff M.D on 07/05/2015 at 1:38 PM  Between 7am to 7pm - Pager - [phone]  After 7pm go to [email or web address] - password [facility]  [facility] -  Office  [phone]

## 2015-07-05 NOTE — NC FL2 (Signed)
[place] MEDICAID FL2 LEVEL OF CARE SCREENING TOOL     IDENTIFICATION  Patient Name: Cassandra StalkerBarbara Stewart Birthdate: 08-27-1950 Sex: female Admission Date (Current Location): 07/03/2015  [facility] [place]   Facility and Address:  [facility], [address], [place], [place] [address]      Provider Number: [id number]  Attending Physician Name and Address:  Starleen Armsawood S Elgergawy, MD  Relative Name and Phone Number:  Milus MallickGrace Robinson - daughter. Phone [phone]    Current Level of Care: Hospital Recommended Level of Care: Skilled Nursing Facility Prior Approval Number:    Date Approved/Denied:   PASRR Number: [id number] [id number] (Eff. 04/28/15)  Discharge Plan: SNF    Current Diagnoses: Patient Active Problem List   Diagnosis Date Noted  . UTI (lower urinary tract infection) 07/04/2015  . Diabetes mellitus with neuropathy (HCC) 07/04/2015  . Essential hypertension 07/04/2015  . Morbid obesity (HCC) 07/04/2015  . Inability to walk 07/04/2015  . Weakness of left leg 07/04/2015  . Elevated LFTs 07/04/2015    Orientation RESPIRATION BLADDER Height & Weight     Self, Time, Situation, Place  Normal Continent Weight: 268 lb 15.4 oz (122 kg) Height:  5' 4.5" (163.8 cm)  BEHAVIORAL SYMPTOMS/MOOD NEUROLOGICAL BOWEL NUTRITION STATUS      Continent Diet (Carb modified - thin liquids)  AMBULATORY STATUS COMMUNICATION OF NEEDS Skin   Extensive Assist (Patient was unable to ambulate with PT during evaluation on 5/9) Verbally Normal                       Personal Care Assistance Level of Assistance  Bathing, Feeding, Dressing Bathing Assistance: Maximum assistance Feeding assistance: Independent Dressing Assistance: Maximum assistance     Functional Limitations Info  Sight, Hearing, Speech Sight Info: Adequate Hearing Info: Adequate Speech Info: Adequate    SPECIAL CARE FACTORS FREQUENCY  PT (By licensed PT)     PT Frequency:  Evaluated 5/9  - a minimum of 3X per week therapy recommended OT Frequency:  (5x/week)            Contractures Contractures Info: Not present    Additional Factors Info  Code Status, Allergies, Insulin Sliding Scale Code Status Info: Full Allergies Info: No known allergies   Insulin Sliding Scale Info: Insulin daily at bedtime and 0-15 units, 3X daily with meals       Current Medications (07/05/2015):  This is the current hospital active medication list Current Facility-Administered Medications  Medication Dose Route Frequency Provider Last Rate Last Dose  . acetaminophen (TYLENOL) tablet 650 mg  650 mg Oral Q6H PRN Alberteen Samhristopher P Danford, MD       Or  . acetaminophen (TYLENOL) suppository 650 mg  650 mg Rectal Q6H PRN Alberteen Samhristopher P Danford, MD      . amLODipine (NORVASC) tablet 10 mg  10 mg Oral Daily Alberteen Samhristopher P Danford, MD   10 mg at 07/05/15 1030  . aspirin EC tablet 81 mg  81 mg Oral QPM Alberteen Samhristopher P Danford, MD   81 mg at 07/04/15 1834  . enoxaparin (LOVENOX) injection 40 mg  40 mg Subcutaneous Q24H Alberteen Samhristopher P Danford, MD   40 mg at 07/05/15 1028  . hydrochlorothiazide (HYDRODIURIL) tablet 25 mg  25 mg Oral Daily Alberteen Samhristopher P Danford, MD   25 mg at 07/05/15 1030  . insulin aspart (novoLOG) injection 0-15 Units  0-15 Units Subcutaneous TID WC Alberteen Samhristopher P Danford, MD   5 Units at 07/05/15  0850  . insulin aspart (novoLOG) injection 0-5 Units  0-5 Units Subcutaneous QHS Alberteen Sam, MD   2 Units at 07/04/15 2236  . insulin glargine (LANTUS) injection 45 Units  45 Units Subcutaneous Daily Alberteen Sam, MD   45 Units at 07/05/15 1029  . lisinopril (PRINIVIL,ZESTRIL) tablet 10 mg  10 mg Oral Daily Alberteen Sam, MD   10 mg at 07/05/15 1030  . ondansetron (ZOFRAN) tablet 4 mg  4 mg Oral Q6H PRN Alberteen Sam, MD       Or  . ondansetron (ZOFRAN) injection 4 mg  4 mg Intravenous Q6H PRN Alberteen Sam, MD      . pravastatin (PRAVACHOL)  tablet 40 mg  40 mg Oral q1800 Alberteen Sam, MD   40 mg at 07/04/15 1834     Discharge Medications: Please see discharge summary for a list of discharge medications.  Relevant Imaging Results:  Relevant Lab Results:   Additional Information ss#[id number].   Cristobal Goldmann, LCSW

## 2015-07-05 NOTE — Clinical Social Work Note (Signed)
Clinical Social Work Assessment  Patient Details  Name: Cassandra StalkerBarbara Stewart MRN: [id number] Date of Birth: 14-Apr-1950  Date of referral:  07/05/15               Reason for consult:  Facility Placement                Permission sought to share information with:  Family Supports Permission granted to share information::  Yes, Verbal Permission Granted  Name::     Cassandra MallickGrace Stewart  Agency::     Relationship::  Daughter  Contact Information:  [phone]  Housing/Transportation Living arrangements for the past 2 months:  Apartment Source of Information:  Patient Patient Interpreter Needed:  None Criminal Activity/Legal Involvement Pertinent to Current Situation/Hospitalization:  No - Comment as needed Significant Relationships:  Adult Children (Patient's son lives in [place] and [place] Lives with:  Self Do you feel safe going back to the place where you live?  No (Patient reported that she has fallen twice since since leaving rehab this past Saturday and knows she needs continued rehab as she lives alone) Need for family participation in patient care:  Yes (Comment)  Care giving concerns:  Patient concerns as she reported falling twice at home since [facility] this [facility].   Social Worker assessment / plan:  CSW talked with patient at the bedside regarding discharge planning and recommendation of ST rehab. Patient in full agreement due to falls at home. Ms. Cassandra Stewart informed CSW that she recently [facility] and wants to return there at discharge.    Employment status:  Disabled (Comment on whether or not currently receiving Disability) Insurance information:  [place], Teacher, English as a foreign languageManaged Medicare [facility]([employer]) PT Recommendations:  Skilled Nursing Facility Information / Referral to community resources:  Skilled Nursing Facility (SNF list not needed as patient has preference - [facility])  Patient/Family's Response to  care:  Patient expressed no concerns regarding her care during this hospitalization.  Patient/Family's Understanding of and Emotional Response to Diagnosis, Current Treatment, and Prognosis:  Not discussed.  Emotional Assessment Appearance:  Appears younger than stated age Attitude/Demeanor/Rapport:  Other (Appropriate) Affect (typically observed):  Pleasant, Appropriate Orientation:  Oriented to Self, Oriented to Place, Oriented to  Time, Oriented to Situation Alcohol / Substance use:  Never Used Psych involvement (Current and /or in the community):  No (Comment)  Discharge Needs  Concerns to be addressed:  Discharge Planning Concerns Readmission within the last 30 days:  No Current discharge risk:  None Barriers to Discharge:  No Barriers Identified   Cassandra Stewart, Cassandra Criger Bradley, LCSW 07/05/2015, 1:09 PM

## 2015-07-05 NOTE — Care Management Obs Status (Signed)
MEDICARE OBSERVATION STATUS NOTIFICATION   Patient Details  Name: Cassandra StalkerBarbara Belter MRN: [id number] Date of Birth: 18-Feb-1951   Medicare Observation Status Notification Given:  Yes    Monica BectonKrieg, Dupree Givler Watson, RN 07/05/2015, 3:02 PM

## 2015-07-05 NOTE — Progress Notes (Signed)
Subjective: No change today  Exam: Filed Vitals:   07/05/15 0423 07/05/15 0910  BP: 152/61 161/61  Pulse: 71 85  Temp: 98.7 F (37.1 C) 98.2 F (36.8 C)  Resp: 22 20     Neurological Examination Mental Status: Alert, oriented, thought content appropriate. Speech fluent without evidence of aphasia. Able to follow 3 step commands without difficulty. Cranial Nerves: II: Visual fields grossly normal, pupils equal, round, reactive to light and accommodation III,IV, VI: ptosis not present, extra-ocular motions intact bilaterally V,VII: smile symmetric, facial light touch sensation normal bilaterally VIII: hearing normal bilaterally IX,X: uvula rises symmetrically XI: bilateral shoulder shrug XII: midline tongue extension Motor: Right :Upper extremity 5/5Left: Upper extremity 5/5 Lower extremity 5/5Lower extremity 2/5--significant pain. Mostly in knee and ankle.  Tone and bulk:normal tone throughout; no atrophy noted Sensory: Pinprick and light touch intact throughout UE bilaterally and right leg. Left leg has decreased sensation in the S1 distribution.  Deep Tendon Reflexes: 2+ and symmetric throughout UE and KJ. 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 This is causing moderately severe spinal stenosis at T10-11 with focal cord hyperintensity and myelomalacia this level. Cord signal abnormality could be acute or chronic. Multiple additional levels of mild spinal stenosis as above  MR LUMBAR SPINE IMPRESSION  Six non-rib-bearing lumbar segments,  the lowest disc space is labeled S1-2. There has been prior surgery on the left at this level.  Mild spinal stenosis at L4-5 and L5-S1.   Felicie MornDavid Smith PA-C [facility] [phone]  Impression: Spinal degeneration and bony overgrowth causing sever spinal stenosis at T10-11 and cord signal abnormality.    Recommendations: 1) Neurosurgical evaluation and treatment per neurosurgery. Neurology will S/O    07/05/2015, 12:30 PM

## 2015-07-05 NOTE — Care Management Note (Signed)
Case Management Note  Patient Details  Name: Tressie StalkerBarbara Pilson MRN: [id number] Date of Birth: [birth year]  Subjective/Objective:                Admitted with inability to walk    Action/Plan: [facility], spoke with patient, she plans to return when discharged. CSW following to facilitate discharge to SNF.  Expected Discharge Date:                  Expected Discharge Plan:  Skilled Nursing Facility  In-House Referral:  Clinical Social Work  Discharge planning Services  CM Consult  Post Acute Care Choice:    Choice offered to:     DME Arranged:    DME Agency:     HH Arranged:    HH Agency:     Status of Service:  In process, will continue to follow  Medicare Important Message Given:    Date Medicare IM Given:    Medicare IM give by:    Date Additional Medicare IM Given:    Additional Medicare Important Message give by:     If discussed at Long Length of Stay Meetings, dates discussed:    Additional Comments:  Monica BectonKrieg, Eldred Sooy Watson, RN 07/05/2015, 3:08 PM

## 2015-07-05 NOTE — Progress Notes (Signed)
Patient requested to have aspirin and pravastatin given at night.  Moved administration time to 10 pm.

## 2015-07-06 ENCOUNTER — Observation Stay (HOSPITAL_COMMUNITY): Payer: Medicare Other

## 2015-07-06 ENCOUNTER — Observation Stay (HOSPITAL_COMMUNITY): Payer: Medicare Other | Admitting: Certified Registered Nurse Anesthetist

## 2015-07-06 ENCOUNTER — Inpatient Hospital Stay (HOSPITAL_COMMUNITY): Payer: Medicare Other

## 2015-07-06 ENCOUNTER — Encounter (HOSPITAL_COMMUNITY)
Admission: EM | Disposition: A | Discharge status: Skilled Nursing Facility | Payer: Self-pay | Source: Home / Self Care | Attending: Internal Medicine

## 2015-07-06 DIAGNOSIS — N390 Urinary tract infection, site not specified: Secondary | ICD-10-CM | POA: Diagnosis present

## 2015-07-06 DIAGNOSIS — W19XXXA Unspecified fall, initial encounter: Secondary | ICD-10-CM | POA: Diagnosis present

## 2015-07-06 DIAGNOSIS — K760 Fatty (change of) liver, not elsewhere classified: Secondary | ICD-10-CM | POA: Diagnosis present

## 2015-07-06 DIAGNOSIS — I10 Essential (primary) hypertension: Secondary | ICD-10-CM | POA: Diagnosis not present

## 2015-07-06 DIAGNOSIS — N179 Acute kidney failure, unspecified: Secondary | ICD-10-CM | POA: Diagnosis not present

## 2015-07-06 DIAGNOSIS — M4804 Spinal stenosis, thoracic region: Secondary | ICD-10-CM | POA: Diagnosis present

## 2015-07-06 DIAGNOSIS — B9620 Unspecified Escherichia coli [E. coli] as the cause of diseases classified elsewhere: Secondary | ICD-10-CM | POA: Diagnosis present

## 2015-07-06 DIAGNOSIS — E875 Hyperkalemia: Secondary | ICD-10-CM | POA: Diagnosis present

## 2015-07-06 DIAGNOSIS — N183 Chronic kidney disease, stage 3 (moderate): Secondary | ICD-10-CM | POA: Diagnosis present

## 2015-07-06 DIAGNOSIS — E1122 Type 2 diabetes mellitus with diabetic chronic kidney disease: Secondary | ICD-10-CM | POA: Diagnosis present

## 2015-07-06 DIAGNOSIS — Z993 Dependence on wheelchair: Secondary | ICD-10-CM | POA: Diagnosis not present

## 2015-07-06 DIAGNOSIS — Z794 Long term (current) use of insulin: Secondary | ICD-10-CM | POA: Diagnosis not present

## 2015-07-06 DIAGNOSIS — I129 Hypertensive chronic kidney disease with stage 1 through stage 4 chronic kidney disease, or unspecified chronic kidney disease: Secondary | ICD-10-CM | POA: Diagnosis present

## 2015-07-06 DIAGNOSIS — Z7982 Long term (current) use of aspirin: Secondary | ICD-10-CM | POA: Diagnosis not present

## 2015-07-06 DIAGNOSIS — R7989 Other specified abnormal findings of blood chemistry: Secondary | ICD-10-CM | POA: Diagnosis not present

## 2015-07-06 DIAGNOSIS — Z79899 Other long term (current) drug therapy: Secondary | ICD-10-CM | POA: Diagnosis not present

## 2015-07-06 DIAGNOSIS — R262 Difficulty in walking, not elsewhere classified: Secondary | ICD-10-CM | POA: Diagnosis present

## 2015-07-06 DIAGNOSIS — R29898 Other symptoms and signs involving the musculoskeletal system: Secondary | ICD-10-CM | POA: Diagnosis not present

## 2015-07-06 DIAGNOSIS — E1140 Type 2 diabetes mellitus with diabetic neuropathy, unspecified: Secondary | ICD-10-CM | POA: Diagnosis present

## 2015-07-06 DIAGNOSIS — N189 Chronic kidney disease, unspecified: Secondary | ICD-10-CM | POA: Diagnosis not present

## 2015-07-06 DIAGNOSIS — Z6841 Body Mass Index (BMI) 40.0 and over, adult: Secondary | ICD-10-CM | POA: Diagnosis not present

## 2015-07-06 DIAGNOSIS — E785 Hyperlipidemia, unspecified: Secondary | ICD-10-CM | POA: Diagnosis present

## 2015-07-06 HISTORY — PX: LUMBAR LAMINECTOMY/DECOMPRESSION MICRODISCECTOMY: SHX5026

## 2015-07-06 LAB — GLUCOSE, CAPILLARY
GLUCOSE-CAPILLARY: 189 mg/dL — AB (ref 65–99)
GLUCOSE-CAPILLARY: 191 mg/dL — AB (ref 65–99)
GLUCOSE-CAPILLARY: 181 mg/dL — AB (ref 65–99)
GLUCOSE-CAPILLARY: 298 mg/dL — AB (ref 65–99)

## 2015-07-06 LAB — PROTIME-INR
INR: 1.03 (ref 0.00–1.49)
Prothrombin Time: 13.7 seconds (ref 11.6–15.2)

## 2015-07-06 LAB — SURGICAL PCR SCREEN
MRSA, PCR: NEGATIVE
Staphylococcus aureus: NEGATIVE

## 2015-07-06 LAB — BASIC METABOLIC PANEL
Anion gap: 14 (ref 5–15)
BUN: 9 mg/dL (ref 6–20)
CALCIUM: 9.7 mg/dL (ref 8.9–10.3)
CO2: 23 mmol/L (ref 22–32)
CREATININE: 0.87 mg/dL (ref 0.44–1.00)
Chloride: 103 mmol/L (ref 101–111)
GFR calc non Af Amer: >60 mL/min (ref >60)
GLUCOSE: 179 mg/dL — AB (ref 65–99)
Potassium: 4.2 mmol/L (ref 3.5–5.1)
Sodium: 140 mmol/L (ref 135–145)

## 2015-07-06 LAB — URINE CULTURE: Culture: >=100000 — AB

## 2015-07-06 LAB — APTT: aPTT: 20 seconds — ABNORMAL LOW (ref 24–37)

## 2015-07-06 LAB — CBC
HEMATOCRIT: 35.2 % — AB (ref 36.0–46.0)
Hemoglobin: 11.3 g/dL — ABNORMAL LOW (ref 12.0–15.0)
MCH: 28.8 pg (ref 26.0–34.0)
MCHC: 32.1 g/dL (ref 30.0–36.0)
MCV: 89.6 fL (ref 78.0–100.0)
Platelets: 258 10*3/uL (ref 150–400)
RBC: 3.93 MIL/uL (ref 3.87–5.11)
RDW: 13.4 % (ref 11.5–15.5)
WBC: 5.3 10*3/uL (ref 4.0–10.5)

## 2015-07-06 LAB — CK: CK TOTAL: 144 U/L (ref 38–234)

## 2015-07-06 SURGERY — LUMBAR LAMINECTOMY/DECOMPRESSION MICRODISCECTOMY 2 LEVELS
Anesthesia: General

## 2015-07-06 MED ORDER — LIDOCAINE HCL (CARDIAC) 20 MG/ML IV SOLN
INTRAVENOUS | Status: DC | PRN
  Administered 2015-07-06: 40 mg via INTRAVENOUS

## 2015-07-06 MED ORDER — ROCURONIUM BROMIDE 100 MG/10ML IV SOLN
INTRAVENOUS | Status: DC | PRN
  Administered 2015-07-06 (×2): 50 mg via INTRAVENOUS

## 2015-07-06 MED ORDER — KETOROLAC TROMETHAMINE 30 MG/ML IJ SOLN
30.0000 mg | Freq: Four times a day (QID) | INTRAMUSCULAR | Status: DC
  Administered 2015-07-07 (×4): 30 mg via INTRAVENOUS
  Filled 2015-07-06 (×4): qty 1

## 2015-07-06 MED ORDER — MIDAZOLAM HCL 2 MG/2ML IJ SOLN
INTRAMUSCULAR | Status: AC
  Filled 2015-07-06: qty 2

## 2015-07-06 MED ORDER — DIAZEPAM 5 MG PO TABS
5.0000 mg | ORAL_TABLET | Freq: Four times a day (QID) | ORAL | Status: DC | PRN
Start: 2015-07-06 — End: 2015-07-11

## 2015-07-06 MED ORDER — PROPOFOL 10 MG/ML IV BOLUS
INTRAVENOUS | Status: DC | PRN
  Administered 2015-07-06: 160 mg via INTRAVENOUS

## 2015-07-06 MED ORDER — SUGAMMADEX SODIUM 200 MG/2ML IV SOLN
INTRAVENOUS | Status: AC
  Filled 2015-07-06: qty 2

## 2015-07-06 MED ORDER — LIDOCAINE-EPINEPHRINE 0.5 %-1:200000 IJ SOLN
INTRAMUSCULAR | Status: DC | PRN
  Administered 2015-07-06: 5 mL

## 2015-07-06 MED ORDER — PROPOFOL 10 MG/ML IV BOLUS
INTRAVENOUS | Status: AC
  Filled 2015-07-06: qty 20

## 2015-07-06 MED ORDER — FENTANYL CITRATE (PF) 100 MCG/2ML IJ SOLN
INTRAMUSCULAR | Status: DC | PRN
  Administered 2015-07-06: 50 ug via INTRAVENOUS
  Administered 2015-07-06: 100 ug via INTRAVENOUS
  Administered 2015-07-06: 50 ug via INTRAVENOUS

## 2015-07-06 MED ORDER — THROMBIN 5000 UNITS EX SOLR
CUTANEOUS | Status: DC | PRN
  Administered 2015-07-06 (×2): 5000 [IU] via TOPICAL

## 2015-07-06 MED ORDER — OXYCODONE HCL 5 MG/5ML PO SOLN: 5.0000 mg | Freq: Once | ORAL | Status: DC | PRN

## 2015-07-06 MED ORDER — HYDROMORPHONE HCL 1 MG/ML IJ SOLN
INTRAMUSCULAR | Status: AC
  Filled 2015-07-06: qty 1

## 2015-07-06 MED ORDER — DEXAMETHASONE SODIUM PHOSPHATE 4 MG/ML IJ SOLN
INTRAMUSCULAR | Status: DC | PRN
  Administered 2015-07-06: 10 mg via INTRAVENOUS

## 2015-07-06 MED ORDER — LACTATED RINGERS IV SOLN
INTRAVENOUS | Status: DC | PRN
  Administered 2015-07-06 (×2): via INTRAVENOUS

## 2015-07-06 MED ORDER — HEMOSTATIC AGENTS (NO CHARGE) OPTIME
TOPICAL | Status: DC | PRN
  Administered 2015-07-06: 1 via TOPICAL

## 2015-07-06 MED ORDER — FENTANYL CITRATE (PF) 250 MCG/5ML IJ SOLN
INTRAMUSCULAR | Status: AC
  Filled 2015-07-06: qty 5

## 2015-07-06 MED ORDER — ENOXAPARIN SODIUM 40 MG/0.4ML ~~LOC~~ SOLN: 40.0000 mg | SUBCUTANEOUS | Status: DC

## 2015-07-06 MED ORDER — OXYCODONE-ACETAMINOPHEN 5-325 MG PO TABS
1.0000 | ORAL_TABLET | ORAL | Status: DC | PRN
  Administered 2015-07-06: 2 via ORAL
  Filled 2015-07-06 (×2): qty 2

## 2015-07-06 MED ORDER — ROCURONIUM BROMIDE 50 MG/5ML IV SOLN
INTRAVENOUS | Status: AC
  Filled 2015-07-06: qty 1

## 2015-07-06 MED ORDER — HYDROMORPHONE HCL 1 MG/ML IJ SOLN
INTRAMUSCULAR | Status: AC
  Administered 2015-07-06: 0.5 mg via INTRAVENOUS
  Filled 2015-07-06: qty 1

## 2015-07-06 MED ORDER — MIDAZOLAM HCL 5 MG/5ML IJ SOLN
INTRAMUSCULAR | Status: DC | PRN
  Administered 2015-07-06 (×2): 1 mg via INTRAVENOUS

## 2015-07-06 MED ORDER — PHENYLEPHRINE HCL 10 MG/ML IJ SOLN
INTRAMUSCULAR | Status: DC | PRN
  Administered 2015-07-06 (×2): 80 ug via INTRAVENOUS

## 2015-07-06 MED ORDER — ONDANSETRON HCL 4 MG/2ML IJ SOLN: 4.0000 mg | Freq: Once | INTRAMUSCULAR | Status: DC | PRN

## 2015-07-06 MED ORDER — ONDANSETRON HCL 4 MG/2ML IJ SOLN
INTRAMUSCULAR | Status: AC
  Filled 2015-07-06: qty 2

## 2015-07-06 MED ORDER — INSULIN GLARGINE 100 UNIT/ML ~~LOC~~ SOLN
35.0000 [IU] | Freq: Every day | SUBCUTANEOUS | Status: DC
  Administered 2015-07-06: 35 [IU] via SUBCUTANEOUS
  Filled 2015-07-06 (×2): qty 0.35

## 2015-07-06 MED ORDER — HYDROMORPHONE HCL 1 MG/ML IJ SOLN
0.2500 mg | INTRAMUSCULAR | Status: DC | PRN
  Administered 2015-07-06 (×2): 0.5 mg via INTRAVENOUS

## 2015-07-06 MED ORDER — 0.9 % SODIUM CHLORIDE (POUR BTL) OPTIME
TOPICAL | Status: DC | PRN
  Administered 2015-07-06: 1000 mL

## 2015-07-06 MED ORDER — LIDOCAINE 2% (20 MG/ML) 5 ML SYRINGE
INTRAMUSCULAR | Status: AC
  Filled 2015-07-06: qty 10

## 2015-07-06 MED ORDER — OXYCODONE HCL 5 MG PO TABS: 5.0000 mg | ORAL_TABLET | Freq: Once | ORAL | Status: DC | PRN

## 2015-07-06 MED ORDER — SUGAMMADEX SODIUM 500 MG/5ML IV SOLN
INTRAVENOUS | Status: DC | PRN
  Administered 2015-07-06: 480 mg via INTRAVENOUS

## 2015-07-06 MED ORDER — BUPIVACAINE HCL 0.5 % IJ SOLN
INTRAMUSCULAR | Status: DC | PRN
  Administered 2015-07-06: 30 mL

## 2015-07-06 SURGICAL SUPPLY — 49 items
BAG DECANTER FOR FLEXI CONT (MISCELLANEOUS) ×2 IMPLANT
BENZOIN TINCTURE PRP APPL 2/3 (GAUZE/BANDAGES/DRESSINGS) IMPLANT
BLADE CLIPPER SURG (BLADE) IMPLANT
BUR MATCHSTICK NEURO 3.0 LAGG (BURR) ×2 IMPLANT
BUR PRECISION FLUTE 5.0 (BURR) ×2 IMPLANT
CANISTER SUCT 3000ML PPV (MISCELLANEOUS) ×2 IMPLANT
DECANTER SPIKE VIAL GLASS SM (MISCELLANEOUS) ×2 IMPLANT
DRAPE LAPAROTOMY 100X72X124 (DRAPES) ×2 IMPLANT
DRAPE MICROSCOPE LEICA (MISCELLANEOUS) IMPLANT
DRAPE POUCH INSTRU U-SHP 10X18 (DRAPES) ×2 IMPLANT
DRAPE SURG 17X23 STRL (DRAPES) ×2 IMPLANT
DRSG OPSITE POSTOP 4X6 (GAUZE/BANDAGES/DRESSINGS) ×2 IMPLANT
DURAPREP 26ML APPLICATOR (WOUND CARE) ×2 IMPLANT
ELECT REM PT RETURN 9FT ADLT (ELECTROSURGICAL) ×2
ELECTRODE REM PT RTRN 9FT ADLT (ELECTROSURGICAL) ×1 IMPLANT
GAUZE SPONGE 4X4 12PLY STRL (GAUZE/BANDAGES/DRESSINGS) IMPLANT
GAUZE SPONGE 4X4 16PLY XRAY LF (GAUZE/BANDAGES/DRESSINGS) IMPLANT
GLOVE BIOGEL PI IND STRL 7.0 (GLOVE) ×1 IMPLANT
GLOVE BIOGEL PI INDICATOR 7.0 (GLOVE) ×1
GLOVE ECLIPSE 6.5 STRL STRAW (GLOVE) ×2 IMPLANT
GLOVE ECLIPSE 7.5 STRL STRAW (GLOVE) ×2 IMPLANT
GLOVE EXAM NITRILE LRG STRL (GLOVE) IMPLANT
GLOVE EXAM NITRILE MD LF STRL (GLOVE) IMPLANT
GLOVE EXAM NITRILE XL STR (GLOVE) IMPLANT
GLOVE EXAM NITRILE XS STR PU (GLOVE) IMPLANT
GOWN STRL REUS W/ TWL LRG LVL3 (GOWN DISPOSABLE) ×2 IMPLANT
GOWN STRL REUS W/ TWL XL LVL3 (GOWN DISPOSABLE) IMPLANT
GOWN STRL REUS W/TWL 2XL LVL3 (GOWN DISPOSABLE) IMPLANT
GOWN STRL REUS W/TWL LRG LVL3 (GOWN DISPOSABLE) ×2
GOWN STRL REUS W/TWL XL LVL3 (GOWN DISPOSABLE)
KIT BASIN OR (CUSTOM PROCEDURE TRAY) ×2 IMPLANT
KIT ROOM TURNOVER OR (KITS) ×2 IMPLANT
LIQUID BAND (GAUZE/BANDAGES/DRESSINGS) ×2 IMPLANT
NEEDLE HYPO 25X1 1.5 SAFETY (NEEDLE) ×2 IMPLANT
NEEDLE SPNL 18GX3.5 QUINCKE PK (NEEDLE) IMPLANT
NS IRRIG 1000ML POUR BTL (IV SOLUTION) ×2 IMPLANT
PACK LAMINECTOMY NEURO (CUSTOM PROCEDURE TRAY) ×2 IMPLANT
PAD ARMBOARD 7.5X6 YLW CONV (MISCELLANEOUS) ×6 IMPLANT
RUBBERBAND STERILE (MISCELLANEOUS) IMPLANT
SPONGE LAP 4X18 X RAY DECT (DISPOSABLE) IMPLANT
SPONGE SURGIFOAM ABS GEL SZ50 (HEMOSTASIS) ×2 IMPLANT
STRIP CLOSURE SKIN 1/2X4 (GAUZE/BANDAGES/DRESSINGS) IMPLANT
SUT VIC AB 0 CT1 18XCR BRD8 (SUTURE) ×1 IMPLANT
SUT VIC AB 0 CT1 8-18 (SUTURE) ×1
SUT VIC AB 2-0 CT1 18 (SUTURE) ×2 IMPLANT
SUT VIC AB 3-0 SH 8-18 (SUTURE) ×2 IMPLANT
TOWEL OR 17X24 6PK STRL BLUE (TOWEL DISPOSABLE) ×2 IMPLANT
TOWEL OR 17X26 10 PK STRL BLUE (TOWEL DISPOSABLE) ×2 IMPLANT
WATER STERILE IRR 1000ML POUR (IV SOLUTION) ×2 IMPLANT

## 2015-07-06 NOTE — Transfer of Care (Signed)
Immediate Anesthesia Transfer of Care Note  Patient: Cassandra Stewart  Procedure(s) Performed: Procedure(s): Thoracic ten- eleven laminectomy for spinal canal decompression (N/A)  Patient Location: PACU  Anesthesia Type:General  Level of Consciousness: awake, alert  and oriented  Airway & Oxygen Therapy: Patient Spontanous Breathing and Patient connected to nasal cannula oxygen  Post-op Assessment: Report given to RN and Post -op Vital signs reviewed and stable  Post vital signs: Reviewed and stable  Last Vitals:  Filed Vitals:   07/05/15 2007 07/06/15 0816  BP: 146/78 147/59  Pulse: 78 73  Temp: 36.9 C 36.8 C  Resp: 20 18    Last Pain:  Filed Vitals:   07/06/15 0816  PainSc: 0-No pain         Complications: No apparent anesthesia complications

## 2015-07-06 NOTE — Anesthesia Preprocedure Evaluation (Signed)
Anesthesia Evaluation  Patient identified by MRN, date of birth, ID band Patient awake    Reviewed: Allergy & Precautions, NPO status , Patient's Chart, lab work & pertinent test results  Airway Mallampati: II  TM Distance: >3 FB Neck ROM: Full    Dental  (+) Teeth Intact, Dental Advisory Given   Pulmonary    breath sounds clear to auscultation       Cardiovascular hypertension,  Rhythm:Regular Rate:Normal     Neuro/Psych    GI/Hepatic   Endo/Other  diabetes  Renal/GU      Musculoskeletal   Abdominal   Peds  Hematology   Anesthesia Other Findings   Reproductive/Obstetrics                             Anesthesia Physical Anesthesia Plan  ASA: III  Anesthesia Plan: General   Post-op Pain Management:    Induction: Intravenous  Airway Management Planned: Oral ETT  Additional Equipment: CVP  Intra-op Plan:   Post-operative Plan: Extubation in OR  Informed Consent: I have reviewed the patients History and Physical, chart, labs and discussed the procedure including the risks, benefits and alternatives for the proposed anesthesia with the patient or authorized representative who has indicated his/her understanding and acceptance.   Dental advisory given  Plan Discussed with: CRNA and Anesthesiologist  Anesthesia Plan Comments:         Anesthesia Quick Evaluation

## 2015-07-06 NOTE — Anesthesia Postprocedure Evaluation (Signed)
Anesthesia Post Note  Patient: Cassandra Stewart  Procedure(s) Performed: Procedure(s) (LRB): Thoracic ten- eleven laminectomy for spinal canal decompression (N/A)  Patient location during evaluation: PACU Anesthesia Type: General Level of consciousness: awake and alert Pain management: pain level controlled Vital Signs Assessment: post-procedure vital signs reviewed and stable Respiratory status: spontaneous breathing, nonlabored ventilation, respiratory function stable and patient connected to nasal cannula oxygen Cardiovascular status: blood pressure returned to baseline and stable Postop Assessment: no signs of nausea or vomiting Anesthetic complications: no    Last Vitals:  Filed Vitals:   07/06/15 1926 07/06/15 2106  BP: 179/93 159/80  Pulse: 91 91  Temp:  36.7 C  Resp: 18 18    Last Pain:  Filed Vitals:   07/06/15 2108  PainSc: 7                  Kacyn Souder DAVID

## 2015-07-06 NOTE — Op Note (Signed)
BP 147/59 mmHg  Pulse 73  Temp(Src) 98.2 F (36.8 C) (Oral)  Resp 18  Ht 5' 4.5" (1.638 m)  Wt 121.337 kg (267 lb 8 oz)  BMI 45.22 kg/m2  SpO2 95% 07/03/2015 - 07/06/2015  6:40 PM  PATIENT:  Cassandra Stewart  65 y.o. female with profound lower extremity weakness and altered spinal cord signal at T10/11. She is stenotic at T10/11 and is taken to the operating room for a thoracic laminectomy at T10/11 to decompress the spinal canal.  PRE-OPERATIVE DIAGNOSIS:  thoracic stenosis T10/11 POST-OPERATIVE DIAGNOSIS:  thoracic stenosis T10/11  PROCEDURE:  Procedure(s): Thoracic ten- eleven laminectomy for spinal canal decompression  SURGEON: Surgeon(s): Coletta MemosKyle Iker Nuttall, MD Lisbeth RenshawNeelesh Nundkumar, MD  ASSISTANTS:Nundkumar, Marlane HatcherNeelesh  ANESTHESIA:   general  EBL:     BLOOD ADMINISTERED:none  CELL SAVER GIVEN:none  COUNT:per nursing  DRAINS: none   SPECIMEN:  No Specimen  DICTATION: Cassandra Stewart was taken to the operating room, intubated, and placed under a general anesthetic without difficulty. She was positioned prone on a Jackson table with all pressure points properly padded.  female Her back was prepped and draped in a sterile manner. I infiltrated 7cc lidocaine into the proposed incision. I opened the skin with a 10 blade, and carried the dissection to the thoracolumbar fascia. With fluoroscopy I localized the T10/11 interlaminar space. I started my decompression of the spinal canal.  I performed near complete laminectomies of T10 and T11 using the Leksell rongeur, Kerrison punches, and the drill. I removed the lamina and the ligamentum flavum to expose the thecal sac. I removed enough bone to decompress the canal.  Dr. Conchita ParisNundkumar assisted with the decompression. I irrigated then closed the wound in layered fashion. I approximated the thoracolumbar fascia, subcutaneous, and subcuticular layers with vicryl sutures. I applied dermabond and an occlusive dressing. She was rolled onto the OR  stretcher, and subsequently extubated.   PLAN OF CARE: Admit to inpatient   PATIENT DISPOSITION:  PACU - hemodynamically stable.   Delay start of Pharmacological VTE agent (>24hrs) due to surgical blood loss or risk of bleeding:  yes

## 2015-07-06 NOTE — Progress Notes (Signed)
PROGRESS NOTE                                                                                                                                                                                                             Patient Demographics:    Cassandra Stewart, is a 65 y.o. female, DOB - 09-23-1950, ZOX:[id number]  Admit date - 07/03/2015   Admitting Physician Alberteen Samhristopher P Danford, MD  Outpatient Primary MD for the patient is Cassandra GingerMELTON, CATHLEEN, MD  LOS -     Chief Complaint  Patient presents with  . Fall  . Weakness       Brief Narrative  65 year old female admittedwith chief complaints of acute on chronic left more than right leg weakness, symptoms started relatively suddenly around 6 weeks ago, according to the patient [facility] and she was sent to an SNF without much improvement, no matching [facility] were located at [facility], she also says that she had a MRI of her back which was unremarkable.  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  X-ray of the L-spine, pelvis, left knee and left hip unremarkable. There is slight left tibial aversion noted on CT left knee but she is not tender in the left knee area, we will continue to monitor. Dr Bess HarvestPrashant  discussed with orthopedics on call Dr Linna CapriceSwinteck who thinks that the CT left knee findings on the tibia are chronic and likely a bone spur. He recommends PT and supportive care.   [facility]    [name], No headache, No chest pain, No abdominal pain , complaints of upper back pain and itching.   Assessment  & Plan :    Principal Problem:   Inability to walk Active Problems:   UTI (lower urinary tract infection)   Diabetes mellitus with neuropathy (HCC)   Essential  hypertension   Morbid obesity (HCC)   Weakness of left leg   Elevated LFTs   Left leg weakness  lower extremity weakness ,left> right - Neurology consult appreciated. 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 -  Neurosurgery consult appreciated, plan for surgical decompression. - We'll check total CK to rule out myositis  Diabetes mellitus - Continue with Lantus, insulin sliding scale, and  NovoLog 5 units 3 times a day before meals  CKD stage III:  - No baseline   HTN:  - Continue home amlodipine, HCTZ, ASA, lisinopril, statin   Elevated LFTs:  - Likely hepatic steatosis.  - Repeat LFT as outpatient with new PCP   UTI - She is asymptomatic, but urine culture growing Escherichia coli more than 100,000 colonies, continue with Rocephin    Code Status : Full  Family Communication  : none at bedside  Disposition Plan  : Pending further workup  Consults  :  Neurology ,  neurosurgery  Procedures  : None  DVT Prophylaxis  :  Lovenox, will hold for possible need of neurosurgery, continue with SCD.  Lab Results  Component Value Date   PLT 258 07/06/2015    Antibiotics  :    Anti-infectives    Start     Dose/Rate Route Frequency Ordered Stop   07/05/15 1400  cefTRIAXone (ROCEPHIN) 1 g in dextrose 5 % 50 mL IVPB     1 g 100 mL/hr over 30 Minutes Intravenous Every 24 hours 07/05/15 1352     07/04/15 0300  cephALEXin (KEFLEX) capsule 500 mg  Status:  Discontinued     500 mg Oral Every 12 hours 07/04/15 0256 07/04/15 0320   07/03/15 2115  cefTRIAXone (ROCEPHIN) 1 g in dextrose 5 % 50 mL IVPB     1 g 100 mL/hr over 30 Minutes Intravenous  Once 07/03/15 2106 07/04/15 0045        Objective:   Filed Vitals:   07/05/15 1800 07/05/15 2007 07/05/15 2158 07/06/15 0816  BP: 161/88 146/78  147/59  Pulse: 75 78  73  Temp: 98.5 F  (36.9 C) 98.4 F (36.9 C)  98.2 F (36.8 C)  TempSrc: Oral Oral  Oral  Resp: 20 20  18   Height:      Weight:   121.337 kg (267 lb 8 oz)   SpO2: 98% 99%  95%    Wt Readings from Last 3 Encounters:  07/05/15 121.337 kg (267 lb 8 oz)     Intake/Output Summary (Last 24 hours) at 07/06/15 0952 Last data filed at 07/06/15 0600  Gross per 24 hour  Intake    240 ml  Output      0 ml  Net    240 ml     Physical Exam  Awake Alert, Oriented X 3,  [place].AT,PERRAL Supple Neck,No JVD,  Symmetrical Chest wall movement, Good air movement bilaterally, CTAB RRR,No Gallops,Rubs or new Murmurs, No Parasternal Heave +ve B.Sounds, Abd Soft, No tenderness, No organomegaly appriciated, No rebound - guarding or rigidity. No Cyanosis, Clubbing or edema,left lower ext weakness    Data Review:    CBC  Recent Labs Lab 07/03/15 2202 07/04/15 0735 07/06/15 0419  WBC 6.6 6.1 5.3  HGB 12.3 11.4* 11.3*  HCT 38.9 36.2 35.2*  PLT 327 277 258  MCV 90.9 91.6 89.6  MCH 28.7 28.9 28.8  MCHC 31.6 31.5 32.1  RDW 14.0 13.9 13.4  LYMPHSABS 2.9  --   --   MONOABS 0.6  --   --   EOSABS 0.3  --   --   BASOSABS 0.0  --   --     Chemistries   Recent Labs Lab 07/03/15 2202 07/04/15 0735 07/06/15 0419  NA 140  141 140  K 4.5 4.5 4.2  CL 102 106 103  CO2 25 24 23   GLUCOSE 180* 271* 179*  BUN 19 21* 9  CREATININE 1.27* 1.24*  1.23* 0.87  CALCIUM 10.0 9.5 9.7  AST 66* 59*  --   ALT 58* 53  --   ALKPHOS 70 62  --   BILITOT 0.7 0.6  --    ------------------------------------------------------------------------------------------------------------------ No results for input(s): CHOL, HDL, LDLCALC, TRIG, CHOLHDL, LDLDIRECT in the last 72 hours.  Lab Results  Component Value Date   HGBA1C 9.7* 07/04/2015   ------------------------------------------------------------------------------------------------------------------ No results for input(s): TSH, T4TOTAL, T3FREE, THYROIDAB in the last 72  hours.  Invalid input(s): FREET3 ------------------------------------------------------------------------------------------------------------------ No results for input(s): VITAMINB12, FOLATE, FERRITIN, TIBC, IRON, RETICCTPCT in the last 72 hours.  Coagulation profile  Recent Labs Lab 07/06/15 0419  INR 1.03    No results for input(s): DDIMER in the last 72 hours.  Cardiac Enzymes No results for input(s): CKMB, TROPONINI, MYOGLOBIN in the last 168 hours.  Invalid input(s): CK ------------------------------------------------------------------------------------------------------------------ No results found for: BNP  Inpatient Medications  Scheduled Meds: . amLODipine  10 mg Oral Daily  . aspirin EC  81 mg Oral QPM  . cefTRIAXone (ROCEPHIN)  IV  1 g Intravenous Q24H  . hydrochlorothiazide  25 mg Oral Daily  . insulin aspart  0-15 Units Subcutaneous TID WC  . insulin aspart  0-5 Units Subcutaneous QHS  . insulin aspart  5 Units Subcutaneous TID WC  . insulin glargine  50 Units Subcutaneous Daily  . lisinopril  10 mg Oral Daily  . pravastatin  40 mg Oral q1800   Continuous Infusions:  PRN Meds:.acetaminophen **OR** acetaminophen, ondansetron **OR** ondansetron (ZOFRAN) IV  Micro Results Recent Results (from the past 240 hour(s))  Urine culture     Status: Abnormal   Collection Time: 07/03/15  7:41 PM  Result Value Ref Range Status   Specimen Description URINE, CLEAN CATCH  Final   Special Requests NONE  Final   Culture >=100,000 COLONIES/mL ESCHERICHIA COLI (A)  Final   Report Status 07/06/2015 FINAL  Final   Organism ID, Bacteria ESCHERICHIA COLI (A)  Final      Susceptibility   Escherichia coli - MIC*    AMPICILLIN 4 SENSITIVE Sensitive     CEFAZOLIN <=4 SENSITIVE Sensitive     CEFTRIAXONE <=1 SENSITIVE Sensitive     CIPROFLOXACIN <=0.25 SENSITIVE Sensitive     GENTAMICIN <=1 SENSITIVE Sensitive     IMIPENEM <=0.25 SENSITIVE Sensitive     NITROFURANTOIN <=16  SENSITIVE Sensitive     TRIMETH/SULFA <=20 SENSITIVE Sensitive     AMPICILLIN/SULBACTAM 4 SENSITIVE Sensitive     PIP/TAZO <=4 SENSITIVE Sensitive     * >=100,000 COLONIES/mL ESCHERICHIA COLI    Radiology Reports Dg Chest 2 View  07/03/2015  CLINICAL DATA:  Recent fall. EXAM: CHEST  2 VIEW COMPARISON:  None. FINDINGS: Two views of the chest were obtained. Lungs are clear without airspace disease, pulmonary edema or large pneumothorax. Haziness in the left lower chest probably related to overlying soft tissues. No large pleural effusions. No acute bone abnormalities. IMPRESSION: No active cardiopulmonary disease. Electronically Signed   By: Richarda Overlie M.D.   On: 07/03/2015 21:19   Dg Lumbar Spine Complete  07/03/2015  CLINICAL DATA:  65 year old female with fall and left leg pain. EXAM: LUMBAR SPINE - COMPLETE 4+ VIEW COMPARISON:  None. FINDINGS: There is no acute fracture or subluxation of the lumbar spine. The vertebral body heights  and disc spaces are maintained. The visualized transverse and spinous processes are intact. The soft tissues are grossly unremarkable. A small amorphous calcific density in the left hemipelvis most compatible with a calcified fibroid. IMPRESSION: No acute/traumatic lumbar spine pathology. Electronically Signed   By: Elgie Collard M.D.   On: 07/03/2015 21:19   Ct Knee Left Wo Contrast  07/04/2015  CLINICAL DATA:  Larey Seat about a week ago. EXAM: CT OF THE left KNEE WITHOUT CONTRAST TECHNIQUE: Multidetector CT imaging of the left knee was performed according to the standard protocol. Multiplanar CT image reconstructions were also generated. COMPARISON:  Radiographs 07/03/2015 FINDINGS: There is no tibial plateau fracture. There is slight fragmentation of the lateral tibial spine which may represent a recent avulsion. There is no significant effusion. There is mild prepatellar soft tissue edema. IMPRESSION: No tibial plateau fracture. Slight fragmentation of the lateral tibial  spine might represent a recent avulsion injury. Electronically Signed   By: Ellery Plunk M.D.   On: 07/04/2015 01:00   Mr Thoracic Spine Wo Contrast  07/04/2015  CLINICAL DATA:  Progressive left leg pain and weakness. History of surgery L5-S1 herniated disc. EXAM: MRI THORACIC AND LUMBAR SPINE WITHOUT CONTRAST TECHNIQUE: Multiplanar and multiecho pulse sequences of the thoracic and lumbar spine were obtained without intravenous contrast. COMPARISON:  None. FINDINGS: MR THORACIC SPINE FINDINGS Image quality degraded by motion and patient size. Negative for fracture or mass lesion. Extensive multilevel spondylosis. There is disc degeneration and spurring throughout the thoracic spine. In addition, there is facet hypertrophy throughout the mid and lower thoracic spine. There is mild spinal stenosis at T5-6, T6-7, T7-8, T9-10. Moderately severe spinal stenosis at T10-11 due to vertebral spurring and facet hypertrophy. There is focal hyperintensity in the cord at this level which is ill-defined. This is compatible with myelomalacia which could be acute or chronic. Mild spinal stenosis at T11-T12. MR LUMBAR SPINE FINDINGS S1 is fully lumbarized. There are 6 lumbarized vertebral segments the lowest of which will be labeled S1. This is counting from skullbase down. This places the severe spinal stenosis and cord hyperintensity at T10-11. Normal lumbar alignment. Negative for fracture or mass lesion. Conus medullaris normal and terminates at approximately mid L3. No evidence of tethered cord or thickening of the filum terminale. Low lying conus medullaris felt to be related to an extra lumbar vertebral segment. L1-2:  Negative L2-3:  Negative L3-4:  Negative L4-5: Mild disc and mild facet degeneration causing mild spinal stenosis L5-S1: Disc and moderate facet degeneration.  Mild spinal stenosis S1-2: Postop changes on the left with epidural scarring. Disc degeneration and spondylosis without disc protrusion.  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 This is causing moderately severe spinal stenosis at T10-11 with focal cord hyperintensity and myelomalacia this level. Cord signal abnormality could be acute or chronic. Multiple additional levels of mild spinal stenosis as above MR LUMBAR SPINE IMPRESSION Six non-rib-bearing lumbar segments, the lowest disc space is labeled S1-2. There has been prior surgery on the left at this level. Mild spinal stenosis at L4-5 and L5-S1. Electronically Signed   By: Marlan Palau M.D.   On: 07/04/2015 17:43   Mr Lumbar Spine Wo Contrast  07/04/2015  CLINICAL DATA:  Progressive left leg pain and weakness. History of surgery L5-S1 herniated disc. EXAM: MRI THORACIC AND LUMBAR SPINE WITHOUT CONTRAST TECHNIQUE: Multiplanar and multiecho pulse sequences of the thoracic and lumbar spine were obtained without intravenous contrast.  COMPARISON:  None. FINDINGS: MR THORACIC SPINE FINDINGS Image quality degraded by motion and patient size. Negative for fracture or mass lesion. Extensive multilevel spondylosis. There is disc degeneration and spurring throughout the thoracic spine. In addition, there is facet hypertrophy throughout the mid and lower thoracic spine. There is mild spinal stenosis at T5-6, T6-7, T7-8, T9-10. Moderately severe spinal stenosis at T10-11 due to vertebral spurring and facet hypertrophy. There is focal hyperintensity in the cord at this level which is ill-defined. This is compatible with myelomalacia which could be acute or chronic. Mild spinal stenosis at T11-T12. MR LUMBAR SPINE FINDINGS S1 is fully lumbarized. There are 6 lumbarized vertebral segments the lowest of which will be labeled S1. This is counting from skullbase down. This places the severe spinal stenosis and cord hyperintensity at T10-11. Normal lumbar alignment. Negative for fracture or mass lesion. Conus  medullaris normal and terminates at approximately mid L3. No evidence of tethered cord or thickening of the filum terminale. Low lying conus medullaris felt to be related to an extra lumbar vertebral segment. L1-2:  Negative L2-3:  Negative L3-4:  Negative L4-5: Mild disc and mild facet degeneration causing mild spinal stenosis L5-S1: Disc and moderate facet degeneration.  Mild spinal stenosis S1-2: Postop changes on the left with epidural scarring. Disc degeneration and spondylosis without disc protrusion. 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 This is causing moderately severe spinal stenosis at T10-11 with focal cord hyperintensity and myelomalacia this level. Cord signal abnormality could be acute or chronic. Multiple additional levels of mild spinal stenosis as above MR LUMBAR SPINE IMPRESSION Six non-rib-bearing lumbar segments, the lowest disc space is labeled S1-2. There has been prior surgery on the left at this level. Mild spinal stenosis at L4-5 and L5-S1. Electronically Signed   By: Marlan Palau M.D.   On: 07/04/2015 17:43   Mr Sacrum/si Joints Wo Contrast  07/04/2015  CLINICAL DATA:  Left leg pain and weakness, progressive.  Diabetes. EXAM: MR SACRUM WITHOUT CONTRAST TECHNIQUE: Multiplanar, multisequence MR imaging was performed. No intravenous contrast was administered. COMPARISON:  07/03/2015 FINDINGS: There is edema tracking in the gluteus medius and minimus muscles, and to a lesser extent in the gluteus maximus, piriformis, and hip adductor musculature. There is also presacral edema. Given the generalized nature, I favor third spacing of fluid over myositis. A creating kinase level could be utilized to assess for the less likely possibility of myositis. Trace left hip joint effusion. Upper normal amount of fluid in both sacroiliac joints but no overt joint effusion. The appearance is relatively  bilaterally symmetric. There is no significant adjacent marrow edema or sclerosis. No erosion along the sacroiliac joints identified. No impingement to the sacral neural foramina. Proximal hamstring tendons appear intact. No discontinuity along the sacrum or coccyx, or compelling findings of sacral insufficiency fracture. IMPRESSION: 1. There is an appearance suggesting third spacing of fluid with presacral edema and edema in the regional musculature in a generalized fashion. Diffuse myositis is a less likely differential diagnostic consideration, correlate with CK level. 2. Trace left hip joint effusion. No overt sacroiliac joint effusion, SI joint erosion, or other findings of sacroiliitis at this time. No sciatic notch impingement. Electronically Signed   By: Gaylyn Rong M.D.   On: 07/04/2015 19:06   Dg Shoulder Left  07/03/2015  CLINICAL DATA:  Fall yesterday and today. Left shoulder pain and decreased range of motion. Initial encounter. EXAM: LEFT SHOULDER -  2+ VIEW COMPARISON:  None. FINDINGS: There is no evidence of fracture or dislocation. Mild acromioclavicular degenerative spurring noted. No other bone lesions identified. Soft tissues are unremarkable. IMPRESSION: No acute findings.  Mild acromioclavicular DJD. Electronically Signed   By: Myles Rosenthal M.D.   On: 07/03/2015 21:19   Dg Knee Complete 4 Views Left  07/03/2015  CLINICAL DATA:  Recurrent falls during past 2 days. Left knee pain and limited mobility. Initial encounter. EXAM: LEFT KNEE - COMPLETE 4+ VIEW COMPARISON:  None. FINDINGS: Diffuse soft tissue swelling noted. A tiny avulsion fracture fragment is seen from the lateral tibial spine. No other fractures are identified. Alignment is normal. No evidence of knee joint effusion. IMPRESSION: Diffuse soft tissue swelling. Tiny avulsion fracture fragment from the lateral tibial spine. Electronically Signed   By: Myles Rosenthal M.D.   On: 07/03/2015 21:21   Dg Hip Unilat With Pelvis 2-3  Views Left  07/03/2015  CLINICAL DATA:  65 year old female with fall and left leg pain. EXAM: DG HIP (WITH OR WITHOUT PELVIS) 2-3V LEFT COMPARISON:  None. FINDINGS: There is no acute fracture or dislocation. No significant degenerative changes. A 1.8 x 2.4 cm amorphous calcific density in the left hemipelvis most likely represents a fibroid. The soft tissues appear unremarkable. IMPRESSION: No fracture or dislocation. Electronically Signed   By: Elgie Collard M.D.   On: 07/03/2015 21:17   Dg Femur Min 2 Views Left  07/03/2015  CLINICAL DATA: Fall On Sunday and Monday.  Left leg pain. EXAM: LEFT FEMUR 2 VIEWS COMPARISON:  None. FINDINGS: No acute bony abnormality. Specifically, no fracture, subluxation, or dislocation. Soft tissues are intact. Calcification in the left side of the pelvis, likely fibroid. Vascular calcifications noted within the left thigh. IMPRESSION: No acute bony abnormality. Electronically Signed   By: Charlett Nose M.D.   On: 07/03/2015 21:18    Time Spent in minutes  25 Minutes   Rolland Steinert M.D on 07/06/2015 at 9:52 AM  Between 7am to 7pm - Pager - [phone]  After 7pm go to [email or web address] - password [email or web address]  [facility] -  Office  [phone]

## 2015-07-06 NOTE — Progress Notes (Signed)
PT Cancellation Note  Patient Details Name: Cassandra StalkerBarbara Stewart MRN: [id number] DOB: May 18, 1950   Cancelled Treatment:    Reason Eval/Treat Not Completed: Other (comment) (Pt scheduled for surgery at 1:00 today. Pt stated she wants to stay in bed and "collect my thoughts and try to stay calm" prior to surgery. She declined OOB. Will follow. )   Tamala SerUhlenberg, Shatasia Cutshaw Kistler 07/06/2015, 10:10 AM [phone]

## 2015-07-06 NOTE — Anesthesia Procedure Notes (Signed)
Procedure Name: Intubation Date/Time: 07/06/2015 4:30 PM Performed by: Clearnce Sorrel Pre-anesthesia Checklist: Patient identified, Emergency Drugs available, Suction available, Patient being monitored and Timeout performed Patient Re-evaluated:Patient Re-evaluated prior to inductionOxygen Delivery Method: Circle system utilized Preoxygenation: Pre-oxygenation with 100% oxygen Intubation Type: IV induction Ventilation: Oral airway inserted - appropriate to patient size Laryngoscope Size: Mac and 3 Grade View: Grade III Tube type: Oral Tube size: 7.0 mm Number of attempts: 1 Airway Equipment and Method: Stylet,  LTA kit utilized and Oral airway Placement Confirmation: ETT inserted through vocal cords under direct vision,  positive ETCO2 and breath sounds checked- equal and bilateral Secured at: 21 cm Tube secured with: Tape Dental Injury: Teeth and Oropharynx as per pre-operative assessment

## 2015-07-06 NOTE — Progress Notes (Signed)
Patient ID: Cassandra StalkerBarbara Stewart, female   DOB: 11/01/1950, 65 y.o.   MRN: [id number] BP 179/93 mmHg  Pulse 91  Temp(Src) 97.3 F (36.3 C) (Oral)  Resp 18  Ht 5' 4.5" (1.638 m)  Wt 121.337 kg (267 lb 8 oz)  BMI 45.22 kg/m2  SpO2 100% Weak in lower extremities, baseline since hospitalization Wound is clean, dry Continue rehab, pt, ot

## 2015-07-07 ENCOUNTER — Encounter (HOSPITAL_COMMUNITY): Payer: Self-pay | Admitting: Neurosurgery

## 2015-07-07 LAB — BASIC METABOLIC PANEL
Anion gap: 16 — ABNORMAL HIGH (ref 5–15)
BUN: 16 mg/dL (ref 6–20)
CHLORIDE: 99 mmol/L — AB (ref 101–111)
CO2: 22 mmol/L (ref 22–32)
CREATININE: 1.13 mg/dL — AB (ref 0.44–1.00)
Calcium: 9.6 mg/dL (ref 8.9–10.3)
GFR calc Af Amer: 57 mL/min — ABNORMAL LOW (ref >60)
GFR calc non Af Amer: 50 mL/min — ABNORMAL LOW (ref >60)
Glucose, Bld: 320 mg/dL — ABNORMAL HIGH (ref 65–99)
Potassium: 6.2 mmol/L (ref 3.5–5.1)
SODIUM: 137 mmol/L (ref 135–145)

## 2015-07-07 LAB — CBC
HCT: 38.6 % (ref 36.0–46.0)
HEMOGLOBIN: 12.4 g/dL (ref 12.0–15.0)
MCH: 28.1 pg (ref 26.0–34.0)
MCHC: 32.1 g/dL (ref 30.0–36.0)
MCV: 87.5 fL (ref 78.0–100.0)
Platelets: 300 10*3/uL (ref 150–400)
RBC: 4.41 MIL/uL (ref 3.87–5.11)
RDW: 13.2 % (ref 11.5–15.5)
WBC: 10.8 10*3/uL — ABNORMAL HIGH (ref 4.0–10.5)

## 2015-07-07 LAB — POTASSIUM: Potassium: 4.9 mmol/L (ref 3.5–5.1)

## 2015-07-07 LAB — GLUCOSE, CAPILLARY
GLUCOSE-CAPILLARY: 158 mg/dL — AB (ref 65–99)
Glucose-Capillary: 331 mg/dL — ABNORMAL HIGH (ref 65–99)
Glucose-Capillary: 354 mg/dL — ABNORMAL HIGH (ref 65–99)
Glucose-Capillary: 277 mg/dL — ABNORMAL HIGH (ref 65–99)
Glucose-Capillary: 253 mg/dL — ABNORMAL HIGH (ref 65–99)
Glucose-Capillary: 220 mg/dL — ABNORMAL HIGH (ref 65–99)

## 2015-07-07 MED ORDER — HYDROMORPHONE HCL 1 MG/ML IJ SOLN: 0.5000 mg | INTRAMUSCULAR | Status: DC | PRN

## 2015-07-07 MED ORDER — ACETAMINOPHEN 325 MG PO TABS
650.0000 mg | ORAL_TABLET | ORAL | Status: DC | PRN
  Administered 2015-07-10: 650 mg via ORAL
  Filled 2015-07-07: qty 2

## 2015-07-07 MED ORDER — INSULIN ASPART 100 UNIT/ML IV SOLN
10.0000 [IU] | Freq: Once | INTRAVENOUS | Status: AC
  Administered 2015-07-07: 10 [IU] via INTRAVENOUS

## 2015-07-07 MED ORDER — INSULIN GLARGINE 100 UNIT/ML ~~LOC~~ SOLN
50.0000 [IU] | Freq: Every day | SUBCUTANEOUS | Status: DC
  Administered 2015-07-07: 50 [IU] via SUBCUTANEOUS
  Filled 2015-07-07: qty 0.5

## 2015-07-07 MED ORDER — MENTHOL 3 MG MT LOZG: 1.0000 | LOZENGE | OROMUCOSAL | Status: DC | PRN

## 2015-07-07 MED ORDER — SODIUM CHLORIDE 0.9% FLUSH: 3.0000 mL | INTRAVENOUS | Status: DC | PRN

## 2015-07-07 MED ORDER — BISACODYL 5 MG PO TBEC: 5.0000 mg | DELAYED_RELEASE_TABLET | Freq: Every day | ORAL | Status: DC | PRN

## 2015-07-07 MED ORDER — SODIUM CHLORIDE 0.9 % IV SOLN: 250.0000 mL | INTRAVENOUS | Status: DC

## 2015-07-07 MED ORDER — INSULIN GLARGINE 100 UNIT/ML ~~LOC~~ SOLN
55.0000 [IU] | Freq: Every day | SUBCUTANEOUS | Status: DC
  Filled 2015-07-07: qty 0.55

## 2015-07-07 MED ORDER — INSULIN ASPART 100 UNIT/ML ~~LOC~~ SOLN
8.0000 [IU] | Freq: Three times a day (TID) | SUBCUTANEOUS | Status: DC
  Administered 2015-07-07 – 2015-07-08 (×3): 8 [IU] via SUBCUTANEOUS

## 2015-07-07 MED ORDER — SENNA 8.6 MG PO TABS
1.0000 | ORAL_TABLET | Freq: Two times a day (BID) | ORAL | Status: DC
  Administered 2015-07-08 – 2015-07-09 (×2): 8.6 mg via ORAL
  Filled 2015-07-07 (×6): qty 1

## 2015-07-07 MED ORDER — POLYETHYLENE GLYCOL 3350 17 G PO PACK
34.0000 g | PACK | Freq: Two times a day (BID) | ORAL | Status: AC
  Administered 2015-07-07 – 2015-07-08 (×2): 34 g via ORAL
  Filled 2015-07-07 (×4): qty 2

## 2015-07-07 MED ORDER — HYDROCODONE-ACETAMINOPHEN 5-325 MG PO TABS
1.0000 | ORAL_TABLET | ORAL | Status: DC | PRN
Start: 2015-07-07 — End: 2015-07-11

## 2015-07-07 MED ORDER — ACETAMINOPHEN 650 MG RE SUPP: 650.0000 mg | RECTAL | Status: DC | PRN

## 2015-07-07 MED ORDER — DEXTROSE 50 % IV SOLN
1.0000 | Freq: Once | INTRAVENOUS | Status: AC
  Administered 2015-07-07: 50 mL via INTRAVENOUS
  Filled 2015-07-07: qty 50

## 2015-07-07 MED ORDER — ALBUTEROL SULFATE (2.5 MG/3ML) 0.083% IN NEBU
10.0000 mg | INHALATION_SOLUTION | Freq: Once | RESPIRATORY_TRACT | Status: AC
  Administered 2015-07-07: 10 mg via RESPIRATORY_TRACT
  Filled 2015-07-07: qty 12

## 2015-07-07 MED ORDER — POTASSIUM CHLORIDE IN NACL 20-0.9 MEQ/L-% IV SOLN
INTRAVENOUS | Status: DC
  Administered 2015-07-07: 02:00:00 via INTRAVENOUS
  Filled 2015-07-07 (×2): qty 1000

## 2015-07-07 MED ORDER — BISACODYL 5 MG PO TBEC
10.0000 mg | DELAYED_RELEASE_TABLET | Freq: Once | ORAL | Status: AC
  Administered 2015-07-07: 10 mg via ORAL
  Filled 2015-07-07: qty 2

## 2015-07-07 MED ORDER — DIPHENHYDRAMINE HCL 25 MG PO CAPS
25.0000 mg | ORAL_CAPSULE | Freq: Four times a day (QID) | ORAL | Status: DC | PRN
  Administered 2015-07-07: 25 mg via ORAL
  Filled 2015-07-07: qty 1

## 2015-07-07 MED ORDER — MAGNESIUM CITRATE PO SOLN: 1.0000 | Freq: Once | ORAL | Status: DC | PRN

## 2015-07-07 MED ORDER — PHENOL 1.4 % MT LIQD: 1.0000 | OROMUCOSAL | Status: DC | PRN

## 2015-07-07 MED ORDER — ZOLPIDEM TARTRATE 5 MG PO TABS
5.0000 mg | ORAL_TABLET | Freq: Every evening | ORAL | Status: DC | PRN
  Filled 2015-07-07: qty 1

## 2015-07-07 MED ORDER — FLEET ENEMA 7-19 GM/118ML RE ENEM
1.0000 | ENEMA | Freq: Once | RECTAL | Status: DC
  Filled 2015-07-07: qty 1

## 2015-07-07 MED ORDER — ENOXAPARIN SODIUM 40 MG/0.4ML ~~LOC~~ SOLN
40.0000 mg | SUBCUTANEOUS | Status: DC
  Administered 2015-07-07 – 2015-07-09 (×3): 40 mg via SUBCUTANEOUS
  Filled 2015-07-07 (×3): qty 0.4

## 2015-07-07 MED ORDER — HYDRALAZINE HCL 25 MG PO TABS
25.0000 mg | ORAL_TABLET | Freq: Three times a day (TID) | ORAL | Status: DC
  Administered 2015-07-07 – 2015-07-10 (×9): 25 mg via ORAL
  Filled 2015-07-07 (×9): qty 1

## 2015-07-07 MED ORDER — SENNOSIDES-DOCUSATE SODIUM 8.6-50 MG PO TABS: 1.0000 | ORAL_TABLET | Freq: Every evening | ORAL | Status: DC | PRN

## 2015-07-07 MED ORDER — SODIUM CHLORIDE 0.9% FLUSH
3.0000 mL | Freq: Two times a day (BID) | INTRAVENOUS | Status: DC
  Administered 2015-07-08: 3 mL via INTRAVENOUS

## 2015-07-07 MED ORDER — SODIUM POLYSTYRENE SULFONATE 15 GM/60ML PO SUSP
45.0000 g | Freq: Once | ORAL | Status: AC
  Administered 2015-07-07: 45 g via ORAL
  Filled 2015-07-07: qty 180

## 2015-07-07 NOTE — Progress Notes (Signed)
PROGRESS NOTE                                                                                                                                                                                                             Patient Demographics:    Cassandra Stewart, is a 65 y.o. female, DOB - 1950-02-28, OZH:[id number]  Admit date - 07/03/2015   Admitting Physician Alberteen Sam, MD  Outpatient Primary MD for the patient is Phyllis Ginger, MD  LOS -     Chief Complaint  Patient presents with  . Fall  . Weakness       Brief Narrative  65 year old female admittedwith chief complaints of acute on chronic left more than right leg weakness, symptoms started relatively suddenly around 6 weeks ago, according to the patient she had workup at [facility] which was unremarkable and she was sent to an SNF without much improvement, no matching records were located at [facility], she also says that she had a MRI of her back which was unremarkable.  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  X-ray of the L-spine, pelvis, left knee and left hip unremarkable. There is slight left tibial aversion noted on CT left knee but she is not tender in the left knee area, we will continue to monitor. Dr Bess Harvest  discussed with orthopedics on call Dr Linna Caprice who thinks that the CT left knee findings on the tibia are chronic and likely a bone spur. He recommends PT and supportive care.   Subjective:    [name] today has, No headache, No chest pain, No abdominal pain , complaints of upper back pain and itching.   Assessment  & Plan :    Principal Problem:   Inability to walk Active Problems:   UTI (lower urinary tract infection)   Diabetes mellitus with neuropathy (HCC)   Essential  hypertension   Morbid obesity (HCC)   Weakness of left leg   Elevated LFTs   Left leg weakness   Thoracic spinal stenosis  lower extremity weakness ,left> right - Neurology consult appreciated. 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 - Unlikely myositis with normal total CK - Neurosurgery consult appreciated, went for thoracic 10-11 laminectomy for spinal canal decompression on 5/11 by Dr. Mikal Plane  Diabetes mellitus - Uncontrolled, will increase Lantus from 50-55, will increase NovoLog from 5-8 units 3 times a day before meals, continue with insulin sliding scale .  CKD stage III:  - No baseline   HTN:  - Continue home amlodipine, HCTZ, ASA, discontinue lisinopril giving hyperkalemia, will start on low dose hydralazine   Elevated LFTs:  - Likely hepatic steatosis.  - Repeat LFT as outpatient with new PCP   UTI - She is asymptomatic, but urine culture growing Escherichia coli more than 100,000 colonies, continue with Rocephin  Hyperkalemia - Given Kayexalate, D50 with IV insulin, albuterol treatment, will follow on repeat level   Code Status : Full  Family Communication  : none at bedside  Disposition Plan  : Pending PT evaluation  Consults  :  Neurology ,  neurosurgery  Procedures  : None  DVT Prophylaxis  :  Lovenox, SCD.  Lab Results  Component Value Date   PLT 300 07/07/2015    Antibiotics  :    Anti-infectives    Start     Dose/Rate Route Frequency Ordered Stop   07/05/15 1400  cefTRIAXone (ROCEPHIN) 1 g in dextrose 5 % 50 mL IVPB     1 g 100 mL/hr over 30 Minutes Intravenous Every 24 hours 07/05/15 1352     07/04/15 0300  cephALEXin (KEFLEX) capsule 500 mg  Status:  Discontinued     500 mg Oral Every 12 hours 07/04/15 0256 07/04/15 0320   07/03/15 2115  cefTRIAXone (ROCEPHIN) 1 g in dextrose 5 % 50 mL IVPB      1 g 100 mL/hr over 30 Minutes Intravenous  Once 07/03/15 2106 07/04/15 0045        Objective:   Filed Vitals:   07/07/15 0600 07/07/15 0749 07/07/15 0843 07/07/15 0904  BP: 172/70 139/56 166/73   Pulse: 92 86 91 89  Temp: 99 F (37.2 C) 99.1 F (37.3 C)    TempSrc: Oral Oral    Resp: 18 17  18   Height:      Weight:      SpO2: 95% 94% 96% 95%    Wt Readings from Last 3 Encounters:  07/06/15 121 kg (266 lb 12.1 oz)     Intake/Output Summary (Last 24 hours) at 07/07/15 1609 Last data filed at 07/07/15 1047  Gross per 24 hour  Intake   1144 ml  Output    100 ml  Net   1044 ml     Physical Exam  Awake Alert, Oriented X 3,  [place].AT,PERRAL Supple Neck,No JVD,  Symmetrical Chest wall movement, Good air movement bilaterally, CTAB RRR,No Gallops,Rubs or new Murmurs, No Parasternal Heave +ve B.Sounds, Abd Soft, No tenderness, No organomegaly appriciated, No rebound - guarding or rigidity. No Cyanosis, Clubbing or edema,left lower ext weakness    Data Review:    CBC  Recent Labs Lab 07/03/15 2202 07/04/15 0735 07/06/15 0419 07/07/15 0548  WBC 6.6 6.1 5.3 10.8*  HGB 12.3 11.4* 11.3* 12.4  HCT 38.9 36.2 35.2* 38.6  PLT 327 277 258 300  MCV 90.9 91.6 89.6 87.5  MCH 28.7 28.9 28.8 28.1  MCHC 31.6 31.5 32.1 32.1  RDW 14.0 13.9 13.4 13.2  LYMPHSABS 2.9  --   --   --   MONOABS 0.6  --   --   --  EOSABS 0.3  --   --   --   BASOSABS 0.0  --   --   --     Chemistries   Recent Labs Lab 07/03/15 2202 07/04/15 0735 07/06/15 0419 07/07/15 0548  NA 140 141 140 137  K 4.5 4.5 4.2 6.2*  CL 102 106 103 99*  CO2 25 24 23 22   GLUCOSE 180* 271* 179* 320*  BUN 19 21* 9 16  CREATININE 1.27* 1.24*  1.23* 0.87 1.13*  CALCIUM 10.0 9.5 9.7 9.6  AST 66* 59*  --   --   ALT 58* 53  --   --   ALKPHOS 70 62  --   --   BILITOT 0.7 0.6  --   --    ------------------------------------------------------------------------------------------------------------------ No  results for input(s): CHOL, HDL, LDLCALC, TRIG, CHOLHDL, LDLDIRECT in the last 72 hours.  Lab Results  Component Value Date   HGBA1C 9.7* 07/04/2015   ------------------------------------------------------------------------------------------------------------------ No results for input(s): TSH, T4TOTAL, T3FREE, THYROIDAB in the last 72 hours.  Invalid input(s): FREET3 ------------------------------------------------------------------------------------------------------------------ No results for input(s): VITAMINB12, FOLATE, FERRITIN, TIBC, IRON, RETICCTPCT in the last 72 hours.  Coagulation profile  Recent Labs Lab 07/06/15 0419  INR 1.03    No results for input(s): DDIMER in the last 72 hours.  Cardiac Enzymes No results for input(s): CKMB, TROPONINI, MYOGLOBIN in the last 168 hours.  Invalid input(s): CK ------------------------------------------------------------------------------------------------------------------ No results found for: BNP  Inpatient Medications  Scheduled Meds: . amLODipine  10 mg Oral Daily  . aspirin EC  81 mg Oral QPM  . bisacodyl  10 mg Oral Once  . cefTRIAXone (ROCEPHIN)  IV  1 g Intravenous Q24H  . hydrochlorothiazide  25 mg Oral Daily  . insulin aspart  0-15 Units Subcutaneous TID WC  . insulin aspart  0-5 Units Subcutaneous QHS  . insulin aspart  5 Units Subcutaneous TID WC  . insulin glargine  50 Units Subcutaneous Daily  . ketorolac  30 mg Intravenous Q6H  . lisinopril  10 mg Oral Daily  . polyethylene glycol  34 g Oral BID  . pravastatin  40 mg Oral q1800  . senna  1 tablet Oral BID  . sodium chloride flush  3 mL Intravenous Q12H   Continuous Infusions: . sodium chloride     PRN Meds:.acetaminophen **OR** acetaminophen, bisacodyl, diazepam, diphenhydrAMINE, HYDROcodone-acetaminophen, HYDROmorphone (DILAUDID) injection, magnesium citrate, menthol-cetylpyridinium **OR** phenol, ondansetron **OR** ondansetron (ZOFRAN) IV,  oxyCODONE-acetaminophen, senna-docusate, sodium chloride flush, zolpidem  Micro Results Recent Results (from the past 240 hour(s))  Urine culture     Status: Abnormal   Collection Time: 07/03/15  7:41 PM  Result Value Ref Range Status   Specimen Description URINE, CLEAN CATCH  Final   Special Requests NONE  Final   Culture >=100,000 COLONIES/mL ESCHERICHIA COLI (A)  Final   Report Status 07/06/2015 FINAL  Final   Organism ID, Bacteria ESCHERICHIA COLI (A)  Final      Susceptibility   Escherichia coli - MIC*    AMPICILLIN 4 SENSITIVE Sensitive     CEFAZOLIN <=4 SENSITIVE Sensitive     CEFTRIAXONE <=1 SENSITIVE Sensitive     CIPROFLOXACIN <=0.25 SENSITIVE Sensitive     GENTAMICIN <=1 SENSITIVE Sensitive     IMIPENEM <=0.25 SENSITIVE Sensitive     NITROFURANTOIN <=16 SENSITIVE Sensitive     TRIMETH/SULFA <=20 SENSITIVE Sensitive     AMPICILLIN/SULBACTAM 4 SENSITIVE Sensitive     PIP/TAZO <=4 SENSITIVE Sensitive     * >=100,000 COLONIES/mL ESCHERICHIA  COLI  Surgical pcr screen     Status: None   Collection Time: 07/06/15  5:10 AM  Result Value Ref Range Status   MRSA, PCR NEGATIVE NEGATIVE Final   Staphylococcus aureus NEGATIVE NEGATIVE Final    Comment:        The Xpert SA Assay (FDA approved for NASAL specimens in patients over 37 years of age), is one component of a comprehensive surveillance program.  Test performance has been validated by [facility] for patients greater than or equal to 38 year old. It is not intended to diagnose infection nor to guide or monitor treatment.     Radiology Reports Dg Chest 2 View  07/03/2015  CLINICAL DATA:  Recent fall. EXAM: CHEST  2 VIEW COMPARISON:  None. FINDINGS: Two views of the chest were obtained. Lungs are clear without airspace disease, pulmonary edema or large pneumothorax. Haziness in the left lower chest probably related to overlying soft tissues. No large pleural effusions. No acute bone abnormalities. IMPRESSION: No  active cardiopulmonary disease. Electronically Signed   By: Richarda Overlie M.D.   On: 07/03/2015 21:19   Dg Thoracolumabar Spine  07/06/2015  CLINICAL DATA:  Elective surgery.  T10-11 laminectomy. EXAM: DG C-ARM 61-120 MIN; THORACOLUMBAR SPINE - 2 VIEW COMPARISON:  Thoracic spine MRI 07/04/2015 FINDINGS: Surgical retractor overlaps the T10 and T11 vertebrae based on the lowest ribs. An orogastric tube is present. IMPRESSION: Fluoroscopy for spinal localization. Electronically Signed   By: Marnee Spring M.D.   On: 07/06/2015 19:33   Dg Lumbar Spine Complete  07/03/2015  CLINICAL DATA:  65 year old female with fall and left leg pain. EXAM: LUMBAR SPINE - COMPLETE 4+ VIEW COMPARISON:  None. FINDINGS: There is no acute fracture or subluxation of the lumbar spine. The vertebral body heights and disc spaces are maintained. The visualized transverse and spinous processes are intact. The soft tissues are grossly unremarkable. A small amorphous calcific density in the left hemipelvis most compatible with a calcified fibroid. IMPRESSION: No acute/traumatic lumbar spine pathology. Electronically Signed   By: Elgie Collard M.D.   On: 07/03/2015 21:19   Ct Knee Left Wo Contrast  07/04/2015  CLINICAL DATA:  Larey Seat about a week ago. EXAM: CT OF THE left KNEE WITHOUT CONTRAST TECHNIQUE: Multidetector CT imaging of the left knee was performed according to the standard protocol. Multiplanar CT image reconstructions were also generated. COMPARISON:  Radiographs 07/03/2015 FINDINGS: There is no tibial plateau fracture. There is slight fragmentation of the lateral tibial spine which may represent a recent avulsion. There is no significant effusion. There is mild prepatellar soft tissue edema. IMPRESSION: No tibial plateau fracture. Slight fragmentation of the lateral tibial spine might represent a recent avulsion injury. Electronically Signed   By: Ellery Plunk M.D.   On: 07/04/2015 01:00   Mr Thoracic Spine Wo  Contrast  07/04/2015  CLINICAL DATA:  Progressive left leg pain and weakness. History of surgery L5-S1 herniated disc. EXAM: MRI THORACIC AND LUMBAR SPINE WITHOUT CONTRAST TECHNIQUE: Multiplanar and multiecho pulse sequences of the thoracic and lumbar spine were obtained without intravenous contrast. COMPARISON:  None. FINDINGS: MR THORACIC SPINE FINDINGS Image quality degraded by motion and patient size. Negative for fracture or mass lesion. Extensive multilevel spondylosis. There is disc degeneration and spurring throughout the thoracic spine. In addition, there is facet hypertrophy throughout the mid and lower thoracic spine. There is mild spinal stenosis at T5-6, T6-7, T7-8, T9-10. Moderately severe spinal stenosis at T10-11 due to vertebral spurring and facet  hypertrophy. There is focal hyperintensity in the cord at this level which is ill-defined. This is compatible with myelomalacia which could be acute or chronic. Mild spinal stenosis at T11-T12. MR LUMBAR SPINE FINDINGS S1 is fully lumbarized. There are 6 lumbarized vertebral segments the lowest of which will be labeled S1. This is counting from skullbase down. This places the severe spinal stenosis and cord hyperintensity at T10-11. Normal lumbar alignment. Negative for fracture or mass lesion. Conus medullaris normal and terminates at approximately mid L3. No evidence of tethered cord or thickening of the filum terminale. Low lying conus medullaris felt to be related to an extra lumbar vertebral segment. L1-2:  Negative L2-3:  Negative L3-4:  Negative L4-5: Mild disc and mild facet degeneration causing mild spinal stenosis L5-S1: Disc and moderate facet degeneration.  Mild spinal stenosis S1-2: Postop changes on the left with epidural scarring. Disc degeneration and spondylosis without disc protrusion. 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 This is causing  moderately severe spinal stenosis at T10-11 with focal cord hyperintensity and myelomalacia this level. Cord signal abnormality could be acute or chronic. Multiple additional levels of mild spinal stenosis as above MR LUMBAR SPINE IMPRESSION Six non-rib-bearing lumbar segments, the lowest disc space is labeled S1-2. There has been prior surgery on the left at this level. Mild spinal stenosis at L4-5 and L5-S1. Electronically Signed   By: Marlan Palauharles  Clark M.D.   On: 07/04/2015 17:43   Mr Lumbar Spine Wo Contrast  07/04/2015  CLINICAL DATA:  Progressive left leg pain and weakness. History of surgery L5-S1 herniated disc. EXAM: MRI THORACIC AND LUMBAR SPINE WITHOUT CONTRAST TECHNIQUE: Multiplanar and multiecho pulse sequences of the thoracic and lumbar spine were obtained without intravenous contrast. COMPARISON:  None. FINDINGS: MR THORACIC SPINE FINDINGS Image quality degraded by motion and patient size. Negative for fracture or mass lesion. Extensive multilevel spondylosis. There is disc degeneration and spurring throughout the thoracic spine. In addition, there is facet hypertrophy throughout the mid and lower thoracic spine. There is mild spinal stenosis at T5-6, T6-7, T7-8, T9-10. Moderately severe spinal stenosis at T10-11 due to vertebral spurring and facet hypertrophy. There is focal hyperintensity in the cord at this level which is ill-defined. This is compatible with myelomalacia which could be acute or chronic. Mild spinal stenosis at T11-T12. MR LUMBAR SPINE FINDINGS S1 is fully lumbarized. There are 6 lumbarized vertebral segments the lowest of which will be labeled S1. This is counting from skullbase down. This places the severe spinal stenosis and cord hyperintensity at T10-11. Normal lumbar alignment. Negative for fracture or mass lesion. Conus medullaris normal and terminates at approximately mid L3. No evidence of tethered cord or thickening of the filum terminale. Low lying conus medullaris felt to  be related to an extra lumbar vertebral segment. L1-2:  Negative L2-3:  Negative L3-4:  Negative L4-5: Mild disc and mild facet degeneration causing mild spinal stenosis L5-S1: Disc and moderate facet degeneration.  Mild spinal stenosis S1-2: Postop changes on the left with epidural scarring. Disc degeneration and spondylosis without disc protrusion. 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 This is causing moderately severe spinal stenosis at T10-11 with focal cord hyperintensity and myelomalacia this level. Cord signal abnormality could be acute or chronic. Multiple additional levels of mild spinal stenosis as above MR LUMBAR SPINE IMPRESSION Six non-rib-bearing lumbar segments, the lowest disc space is  labeled S1-2. There has been prior surgery on the left at this level. Mild spinal stenosis at L4-5 and L5-S1. Electronically Signed   By: Marlan Palau M.D.   On: 07/04/2015 17:43   Mr Sacrum/si Joints Wo Contrast  07/04/2015  CLINICAL DATA:  Left leg pain and weakness, progressive.  Diabetes. EXAM: MR SACRUM WITHOUT CONTRAST TECHNIQUE: Multiplanar, multisequence MR imaging was performed. No intravenous contrast was administered. COMPARISON:  07/03/2015 FINDINGS: There is edema tracking in the gluteus medius and minimus muscles, and to a lesser extent in the gluteus maximus, piriformis, and hip adductor musculature. There is also presacral edema. Given the generalized nature, I favor third spacing of fluid over myositis. A creating kinase level could be utilized to assess for the less likely possibility of myositis. Trace left hip joint effusion. Upper normal amount of fluid in both sacroiliac joints but no overt joint effusion. The appearance is relatively bilaterally symmetric. There is no significant adjacent marrow edema or sclerosis. No erosion along the sacroiliac joints identified. No impingement to the sacral  neural foramina. Proximal hamstring tendons appear intact. No discontinuity along the sacrum or coccyx, or compelling findings of sacral insufficiency fracture. IMPRESSION: 1. There is an appearance suggesting third spacing of fluid with presacral edema and edema in the regional musculature in a generalized fashion. Diffuse myositis is a less likely differential diagnostic consideration, correlate with CK level. 2. Trace left hip joint effusion. No overt sacroiliac joint effusion, SI joint erosion, or other findings of sacroiliitis at this time. No sciatic notch impingement. Electronically Signed   By: Gaylyn Rong M.D.   On: 07/04/2015 19:06   Dg Chest Port 1 View  07/06/2015  CLINICAL DATA:  Patient with central line placement. EXAM: PORTABLE CHEST 1 VIEW COMPARISON:  Chest radiograph 07/03/2015 FINDINGS: Right IJ central venous catheter tip projects over the superior vena cava. Multiple monitoring leads overlie the patient. Stable enlarged cardiac and mediastinal contours. No consolidative pulmonary opacities. No pleural effusion or pneumothorax. AC joint degenerative changes. IMPRESSION: Right IJ central venous catheter tip projects over the superior cavoatrial junction. Electronically Signed   By: Annia Belt M.D.   On: 07/06/2015 19:37   Dg Shoulder Left  07/03/2015  CLINICAL DATA:  Fall yesterday and today. Left shoulder pain and decreased range of motion. Initial encounter. EXAM: LEFT SHOULDER - 2+ VIEW COMPARISON:  None. FINDINGS: There is no evidence of fracture or dislocation. Mild acromioclavicular degenerative spurring noted. No other bone lesions identified. Soft tissues are unremarkable. IMPRESSION: No acute findings.  Mild acromioclavicular DJD. Electronically Signed   By: Myles Rosenthal M.D.   On: 07/03/2015 21:19   Dg Knee Complete 4 Views Left  07/03/2015  CLINICAL DATA:  Recurrent falls during past 2 days. Left knee pain and limited mobility. Initial encounter. EXAM: LEFT KNEE -  COMPLETE 4+ VIEW COMPARISON:  None. FINDINGS: Diffuse soft tissue swelling noted. A tiny avulsion fracture fragment is seen from the lateral tibial spine. No other fractures are identified. Alignment is normal. No evidence of knee joint effusion. IMPRESSION: Diffuse soft tissue swelling. Tiny avulsion fracture fragment from the lateral tibial spine. Electronically Signed   By: Myles Rosenthal M.D.   On: 07/03/2015 21:21   Dg C-arm 1-60 Min  07/06/2015  CLINICAL DATA:  Elective surgery.  T10-11 laminectomy. EXAM: DG C-ARM 61-120 MIN; THORACOLUMBAR SPINE - 2 VIEW COMPARISON:  Thoracic spine MRI 07/04/2015 FINDINGS: Surgical retractor overlaps the T10 and T11 vertebrae based on the lowest ribs. An orogastric tube  is present. IMPRESSION: Fluoroscopy for spinal localization. Electronically Signed   By: Marnee Spring M.D.   On: 07/06/2015 19:33   Dg Hip Unilat With Pelvis 2-3 Views Left  07/03/2015  CLINICAL DATA:  65 year old female with fall and left leg pain. EXAM: DG HIP (WITH OR WITHOUT PELVIS) 2-3V LEFT COMPARISON:  None. FINDINGS: There is no acute fracture or dislocation. No significant degenerative changes. A 1.8 x 2.4 cm amorphous calcific density in the left hemipelvis most likely represents a fibroid. The soft tissues appear unremarkable. IMPRESSION: No fracture or dislocation. Electronically Signed   By: Elgie Collard M.D.   On: 07/03/2015 21:17   Dg Femur Min 2 Views Left  07/03/2015  CLINICAL DATA: Fall On Sunday and Monday.  Left leg pain. EXAM: LEFT FEMUR 2 VIEWS COMPARISON:  None. FINDINGS: No acute bony abnormality. Specifically, no fracture, subluxation, or dislocation. Soft tissues are intact. Calcification in the left side of the pelvis, likely fibroid. Vascular calcifications noted within the left thigh. IMPRESSION: No acute bony abnormality. Electronically Signed   By: Charlett Nose M.D.   On: 07/03/2015 21:18    Time Spent in minutes  25 Minutes   ELGERGAWY, DAWOOD M.D on 07/07/2015 at  4:09 PM  Between 7am to 7pm - Pager - [phone]  After 7pm go to [email or web address] - password [email or web address]  [facility] -  Office  [phone]

## 2015-07-07 NOTE — Progress Notes (Signed)
Patient informed charge nurse and nurse tech that she is experiencing pain. This RN assessed patient. Patient stated that she did not want any pain medication due to it making her incontinent while she is sleeping. Patient stated that "I will tough it out". RN informed patient that pain medication is available if the pain becomes intolerable. Patient's BP this morning is 172/70. On call NP notified. Awaiting orders. RN will continue to monitor patient.  Veatrice KellsMahmoud,Malike Foglio I, RN

## 2015-07-07 NOTE — Progress Notes (Addendum)
Pt received Kayexalate this am however has not had BM. Enema and laxatives ordered. Pt refusing enema. Educated pt on hyperkalemia and implications. Pt still refused enema but did agree to take laxatives. MD notified.

## 2015-07-07 NOTE — Progress Notes (Signed)
PT Cancellation Note  Patient Details Name: Cassandra StalkerBarbara Laprise MRN: [id number] DOB: [birth year]   Cancelled Treatment:    Reason Eval/Treat Not Completed: Patient not medically ready;Other (comment) (critical labs, will check later).  Pt has had multiple labs today very high, see how pt is feeling later.   Ivar DrapeStout, Takaya Hyslop E 07/07/2015, 10:01 AM   Samul Dadauth Catina Nuss, PT MS Acute Rehab Dept. Number: [facility] [phone] and [facility] [phone]

## 2015-07-07 NOTE — Progress Notes (Signed)
Pt informed RN that she felt "SOB" and lightheaded. Raised pt's head in bed. Vital signs taken- 97% on RA, BP 166/73, HR 91. Rechecked pt's CBG- 356. Gave insulin as ordered. Pt now c/o itching similar to "itching she felt during allergic reaction to wipes before surgery yesterday." No PRNs for itching ordered, will notify MD.

## 2015-07-07 NOTE — Progress Notes (Signed)
Patient ID: Cassandra StalkerBarbara Guest, female   DOB: [birth year], 65 y.o.   MRN: [id number] BP 119/48 mmHg  Pulse 88  Temp(Src) 99.4 F (37.4 C) (Oral)  Resp 18  Ht 5' 4.5" (1.638 m)  Wt 126 kg (277 lb 12.5 oz)  BMI 46.96 kg/m2  SpO2 95% Alert and oriented x 4 Moving lower extremities slightly better, left lower extremity remains weaker than the right.  [name] reports her back feels better. Wound is clean, and dry 3/5left lower extremity. 4/5 on right

## 2015-07-08 DIAGNOSIS — N179 Acute kidney failure, unspecified: Secondary | ICD-10-CM

## 2015-07-08 DIAGNOSIS — N189 Chronic kidney disease, unspecified: Secondary | ICD-10-CM

## 2015-07-08 LAB — BASIC METABOLIC PANEL
Anion gap: 13 (ref 5–15)
BUN: 26 mg/dL — AB (ref 6–20)
CALCIUM: 8.8 mg/dL — AB (ref 8.9–10.3)
CO2: 27 mmol/L (ref 22–32)
CREATININE: 1.64 mg/dL — AB (ref 0.44–1.00)
Chloride: 103 mmol/L (ref 101–111)
GFR calc Af Amer: 37 mL/min — ABNORMAL LOW (ref >60)
GFR, EST NON AFRICAN AMERICAN: 32 mL/min — AB (ref >60)
Glucose, Bld: 100 mg/dL — ABNORMAL HIGH (ref 65–99)
Potassium: 3.8 mmol/L (ref 3.5–5.1)
SODIUM: 143 mmol/L (ref 135–145)

## 2015-07-08 LAB — GLUCOSE, CAPILLARY
GLUCOSE-CAPILLARY: 108 mg/dL — AB (ref 65–99)
Glucose-Capillary: 176 mg/dL — ABNORMAL HIGH (ref 65–99)
Glucose-Capillary: 251 mg/dL — ABNORMAL HIGH (ref 65–99)
Glucose-Capillary: 110 mg/dL — ABNORMAL HIGH (ref 65–99)

## 2015-07-08 LAB — CBC
HCT: 33.8 % — ABNORMAL LOW (ref 36.0–46.0)
HEMOGLOBIN: 10.9 g/dL — AB (ref 12.0–15.0)
MCH: 29.1 pg (ref 26.0–34.0)
MCHC: 32.2 g/dL (ref 30.0–36.0)
MCV: 90.4 fL (ref 78.0–100.0)
PLATELETS: 250 10*3/uL (ref 150–400)
RBC: 3.74 MIL/uL — ABNORMAL LOW (ref 3.87–5.11)
RDW: 13.8 % (ref 11.5–15.5)
WBC: 9.6 10*3/uL (ref 4.0–10.5)

## 2015-07-08 MED ORDER — ASPIRIN EC 81 MG PO TBEC
81.0000 mg | DELAYED_RELEASE_TABLET | Freq: Every day | ORAL | Status: DC
  Administered 2015-07-08 – 2015-07-09 (×2): 81 mg via ORAL
  Filled 2015-07-08 (×2): qty 1

## 2015-07-08 MED ORDER — HYDROMORPHONE HCL 1 MG/ML IJ SOLN: 0.5000 mg | INTRAMUSCULAR | Status: DC | PRN

## 2015-07-08 MED ORDER — SODIUM CHLORIDE 0.9 % IV SOLN
INTRAVENOUS | Status: DC
  Administered 2015-07-08 (×2): via INTRAVENOUS

## 2015-07-08 MED ORDER — INSULIN GLARGINE 100 UNIT/ML ~~LOC~~ SOLN
60.0000 [IU] | Freq: Every day | SUBCUTANEOUS | Status: DC
  Administered 2015-07-08: 60 [IU] via SUBCUTANEOUS
  Filled 2015-07-08 (×2): qty 0.6

## 2015-07-08 MED ORDER — PRAVASTATIN SODIUM 40 MG PO TABS
40.0000 mg | ORAL_TABLET | Freq: Every day | ORAL | Status: DC
  Administered 2015-07-08 – 2015-07-09 (×2): 40 mg via ORAL
  Filled 2015-07-08 (×2): qty 1

## 2015-07-08 MED ORDER — SODIUM CHLORIDE 0.9% FLUSH
10.0000 mL | INTRAVENOUS | Status: DC | PRN
  Administered 2015-07-08: 20 mL
  Administered 2015-07-09: 10 mL
  Filled 2015-07-08 (×2): qty 40

## 2015-07-08 NOTE — Progress Notes (Signed)
Physical Therapy Treatment/Re-Evaluation Patient Details Name: Cassandra Stewart MRN: [id number] DOB: Mar 05, 1950 Today's Date: 07/08/2015    History of Present Illness HPI: Cassandra StalkerBarbara Gongora is a 65 y.o. female with a past medical history significant for IDDM, MO, and HTN who presents with fall from leg weakness and inability to stand; mid-February when she had somewhat abrupt onset of left leg weakness, etiology unclear (recent scans reveal brain and spine normal); one hosptialization for UTI, AKI since; 21 day stay at SNF for rehab, home 2 days, essentially wheelchair bound; Radiograph of the left knee showed possible avulsion fracture, which was also imaged on CT LEFT knee.  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   PT Comments    Patient is s/p above surgery resulting in functional limitations due to the deficits listed below (see PT Problem List). [name] continues to present w/ Lt LE weakness and now pain s/p thoracic laminectomy. She currently requires mod +2 assist for lateral scoot to chair using sliding board.  Continue to recommend SNF as appropriate d/c plan. Patient will benefit from skilled PT to increase their independence and safety with mobility to allow discharge to the venue listed below.     Follow Up Recommendations  SNF     Equipment Recommendations  Other (comment) (To be determined at next venue of care)    Recommendations for Other Services OT consult     Precautions / Restrictions Precautions Precautions: Fall;Back Precaution Booklet Issued: No Precaution Comments: Reviewedno bending, arching, twisting Required Braces or Orthoses:  (No brace needed) Restrictions Weight Bearing Restrictions: No    Mobility  Bed Mobility Overal bed mobility: Needs Assistance Bed Mobility: Rolling;Sidelying to Sit Rolling: Min assist (with rail) Sidelying to sit: Mod assist;HOB  elevated       General bed mobility comments: Min assist managing Lt LE to roll.  Mod assist to assist pt w/ pushing up to sitting w/ cues for technique.  Transfers Overall transfer level: Needs assistance Equipment used:  (Sliding board) Transfers: Lateral/Scoot Transfers          Lateral/Scoot Transfers: Mod assist;+2 physical assistance;With slide board General transfer comment: Attempted sit>stand w/ gait belt and bed pad to boost and Lt knee blocked but Lt LE too weak to achieve bottom off bed.  Pt attempted to scoot Ind; however, limited by pain and Bil LE weakness.  Required +2 mod assist to assist w/ scooting using bed pad to assist to chair using sliding board.   Ambulation/Gait                 Stairs            Wheelchair Mobility    Modified Rankin (Stroke Patients Only)       Balance Overall balance assessment: Needs assistance Sitting-balance support: Bilateral upper extremity supported;Feet supported Sitting balance-Leahy Scale: Fair                              Cognition Arousal/Alertness: Awake/alert Behavior During Therapy: WFL for tasks assessed/performed Overall Cognitive Status: Within Functional Limits for tasks assessed                      Exercises      General Comments General comments (skin integrity, edema, etc.): Strength Lt LE: hip flexion 2/5, knee extension 2-/5, DF 2/5, PF 2/5. Pt anxious about using  lift due to previous h/o fall out lift at SNF [facility].  Pt reassured that at least 2 people will be assisting w/ lift transfer and that they are well trained.  Pt understanding.      Pertinent Vitals/Pain Pain Assessment: Faces Faces Pain Scale: Hurts little more Pain Location: back Pain Descriptors / Indicators: Grimacing;Discomfort Pain Intervention(s): Limited activity within patient's tolerance;Monitored during session;Repositioned    Home Living                      Prior Function             PT Goals (current goals can now be found in the care plan section) Acute Rehab PT Goals Patient Stated Goal: Would like to get to the chair PT Goal Formulation: With patient Time For Goal Achievement: 07/22/15 Potential to Achieve Goals: Good Progress towards PT goals: Goals downgraded-see care plan    Frequency  Min 3X/week    PT Plan Current plan remains appropriate    Co-evaluation             End of Session Equipment Utilized During Treatment: Gait belt (slididng board) Activity Tolerance: Patient limited by pain;Patient limited by fatigue Patient left: in chair;with call bell/phone within reach     Time: 1046-1120 PT Time Calculation (min) (ACUTE ONLY): 34 min  Charges:  $Therapeutic Activity: 8-22 mins                    G Codes:      Encarnacion Chu PT, DPT  Pager: [phone] Phone: [phone] 07/08/2015, 11:45 AM

## 2015-07-08 NOTE — Progress Notes (Signed)
PROGRESS NOTE                                                                                                                                                                                                             Patient Demographics:    Cassandra Stewart, is a 65 y.o. female, DOB - Mar 30, 1950, ZOX:[id number]  Admit date - 07/03/2015   Admitting Physician Alberteen Samhristopher P Danford, MD  Outpatient Primary MD for the patient is [name], CATHLEEN, MD  LOS -     Chief Complaint  Patient presents with  . Fall  . Weakness       Brief Narrative  65 year old female admittedwith chief complaints of acute on chronic left more than right leg weakness, symptoms started relatively suddenly around 6 weeks ago, according to the patient [facility] and she was sent to an SNF without much improvement, [facility], she also says that she had a MRI of her back which was unremarkable.  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  X-ray of the L-spine, pelvis, left knee and left hip unremarkable. There is slight left tibial aversion noted on CT left knee but she is not tender in the left knee area, we will continue to monitor. Dr Bess HarvestPrashant  discussed with orthopedics on call Dr Linna CapriceSwinteck who thinks that the CT left knee findings on the tibia are chronic and likely a bone spur. He recommends PT and supportive care.   [facility]ive[facility]    [facility], No headache, No chest pain, No abdominal pain , complaints of upper back pain and itchingIs improving.   Assessment  & Plan :    Principal Problem:   Inability to walk Active Problems:   UTI (lower urinary tract infection)   Diabetes mellitus with neuropathy (HCC)   Essential  hypertension   Morbid obesity (HCC)   Weakness of left leg   Elevated LFTs   Left leg weakness   Thoracic spinal stenosis  lower extremity weakness ,left> right - Neurology consult appreciated. 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 - Unlikely myositis with normal total CK - Neurosurgery consult appreciated, went for thoracic 10-11 laminectomy for spinal canal decompression on 5/11 by Dr. Mikal Plane  Diabetes mellitus - Better controlled after Lantus from 50-60, and NovoLog from 5-8 units 3 times a day before meals, continue with insulin sliding scale .  AKI on CKD stage III:  - Worsened renal function this a.m. with creatinine of 1.65, will discontinue Toradol, lisinopril and hydrochlorothiazide, will start on IV fluids, recheck in a.m.   HTN:  - Continue home amlodipine, stop hydrochlorothiazide and to pill given worsening renal function, blood pressure remains acceptable after starting by mouth hydralazine yesterday.   Elevated LFTs:  - Likely hepatic steatosis.  - Repeat LFT as outpatient with new PCP    UTI - She is asymptomatic, but urine culture growing Escherichia coli more than 100,000 colonies, treated with Rocephin  Hyperkalemia - Given Kayexalate, D50 with IV insulin, albuterol treatment, resolved  - will D/C right IJ TLC.  Code Status : Full  Family Communication  : none at bedside  Disposition Plan  : wil need SNF placment.  Consults  :  Neurology ,  neurosurgery  Procedures  : thoracic 10-11 laminectomy for spinal canal decompression on 5/11 by Dr. Mikal Plane  DVT Prophylaxis  :  Lovenox, SCD.  Lab Results  Component Value Date   PLT 250 07/08/2015    Antibiotics  :    Anti-infectives    Start     Dose/Rate Route Frequency Ordered Stop   07/05/15 1400  cefTRIAXone (ROCEPHIN) 1 g in dextrose 5 % 50 mL IVPB   Status:  Discontinued     1 g 100 mL/hr over 30 Minutes Intravenous Every 24 hours 07/05/15 1352 07/08/15 0804   07/04/15 0300  cephALEXin (KEFLEX) capsule 500 mg  Status:  Discontinued     500 mg Oral Every 12 hours 07/04/15 0256 07/04/15 0320   07/03/15 2115  cefTRIAXone (ROCEPHIN) 1 g in dextrose 5 % 50 mL IVPB     1 g 100 mL/hr over 30 Minutes Intravenous  Once 07/03/15 2106 07/04/15 0045        Objective:   Filed Vitals:   07/07/15 1711 07/07/15 2025 07/08/15 0514 07/08/15 0857  BP: 136/61 119/48 110/36 130/48  Pulse: 74 88 71 69  Temp: 98.6 F (37 C) 99.4 F (37.4 C) 98.7 F (37.1 C) 98.6 F (37 C)  TempSrc: Oral Oral Oral Oral  Resp: 17 18 16 18   Height:      Weight:  126 kg (277 lb 12.5 oz)    SpO2: 95% 95% 95% 99%    Wt Readings from Last 3 Encounters:  07/07/15 126 kg (277 lb 12.5 oz)     Intake/Output Summary (Last 24 hours) at 07/08/15 1544 Last data filed at 07/08/15 1143  Gross per 24 hour  Intake    720 ml  Output      0 ml  Net    720 ml     Physical Exam  Awake Alert, Oriented X 3,   [place].AT,PERRAL Supple Neck,No JVD,  Symmetrical Chest wall movement, Good air movement bilaterally, CTAB RRR,No Gallops,Rubs or new Murmurs, No Parasternal Heave +ve B.Sounds, Abd Soft, No tenderness, No rebound - guarding or rigidity. No Cyanosis, Clubbing or edema,left lower ext weakness    Data Review:    CBC  Recent Labs Lab 07/03/15 2202 07/04/15 0735 07/06/15 0419 07/07/15 0548 07/08/15 0430  WBC 6.6 6.1 5.3 10.8*  9.6  HGB 12.3 11.4* 11.3* 12.4 10.9*  HCT 38.9 36.2 35.2* 38.6 33.8*  PLT 327 277 258 300 250  MCV 90.9 91.6 89.6 87.5 90.4  MCH 28.7 28.9 28.8 28.1 29.1  MCHC 31.6 31.5 32.1 32.1 32.2  RDW 14.0 13.9 13.4 13.2 13.8  LYMPHSABS 2.9  --   --   --   --   MONOABS 0.6  --   --   --   --   EOSABS 0.3  --   --   --   --   BASOSABS 0.0  --   --   --   --     Chemistries   Recent Labs Lab 07/03/15 2202 07/04/15 0735 07/06/15 0419  07/07/15 0548 07/07/15 1303 07/08/15 0430  NA 140 141 140 137  --  143  K 4.5 4.5 4.2 6.2* 4.9 3.8  CL 102 106 103 99*  --  103  CO2 --  27  GLUCOSE 180* 271* 179* 320*  --  100*  BUN 19 21* 9 16  --  26*  CREATININE 1.27* 1.24*  1.23* 0.87 1.13*  --  1.64*  CALCIUM 10.0 9.5 9.7 9.6  --  8.8*  AST 66* 59*  --   --   --   --   ALT 58* 53  --   --   --   --   ALKPHOS 70 62  --   --   --   --   BILITOT 0.7 0.6  --   --   --   --    ------------------------------------------------------------------------------------------------------------------ No results for input(s): CHOL, HDL, LDLCALC, TRIG, CHOLHDL, LDLDIRECT in the last 72 hours.  Lab Results  Component Value Date   HGBA1C 9.7* 07/04/2015   ------------------------------------------------------------------------------------------------------------------ No results for input(s): TSH, T4TOTAL, T3FREE, THYROIDAB in the last 72 hours.  Invalid input(s): FREET3 ------------------------------------------------------------------------------------------------------------------ No results for input(s): VITAMINB12, FOLATE, FERRITIN, TIBC, IRON, RETICCTPCT in the last 72 hours.  Coagulation profile  Recent Labs Lab 07/06/15 0419  INR 1.03    No results for input(s): DDIMER in the last 72 hours.  Cardiac Enzymes No results for input(s): CKMB, TROPONINI, MYOGLOBIN in the last 168 hours.  Invalid input(s): CK ------------------------------------------------------------------------------------------------------------------ No results found for: BNP  Inpatient Medications  Scheduled Meds: . amLODipine  10 mg Oral Daily  . aspirin EC  81 mg Oral QPM  . enoxaparin (LOVENOX) injection  40 mg Subcutaneous Q24H  . hydrALAZINE  25 mg Oral Q8H  . insulin aspart  0-15 Units Subcutaneous TID WC  . insulin aspart  0-5 Units Subcutaneous QHS  . insulin aspart  8 Units Subcutaneous TID WC  . insulin glargine  60 Units  Subcutaneous Daily  . polyethylene glycol  34 g Oral BID  . pravastatin  40 mg Oral q1800  . senna  1 tablet Oral BID  . sodium chloride flush  3 mL Intravenous Q12H  . sodium phosphate  1 enema Rectal Once   Continuous Infusions: . sodium chloride    . sodium chloride 75 mL/hr at 07/08/15 0935   PRN Meds:.acetaminophen **OR** acetaminophen, bisacodyl, diazepam, diphenhydrAMINE, HYDROcodone-acetaminophen, HYDROmorphone (DILAUDID) injection, magnesium citrate, menthol-cetylpyridinium **OR** phenol, ondansetron **OR** ondansetron (ZOFRAN) IV, oxyCODONE-acetaminophen, senna-docusate, sodium chloride flush, sodium chloride flush, zolpidem  Micro Results Recent Results (from the past 240 hour(s))  Urine culture     Status: Abnormal   Collection Time: 07/03/15  7:41 PM  Result Value Ref Range Status  Specimen Description URINE, CLEAN CATCH  Final   Special Requests NONE  Final   Culture >=100,000 COLONIES/mL ESCHERICHIA COLI (A)  Final   Report Status 07/06/2015 FINAL  Final   Organism ID, Bacteria ESCHERICHIA COLI (A)  Final      Susceptibility   Escherichia coli - MIC*    AMPICILLIN 4 SENSITIVE Sensitive     CEFAZOLIN <=4 SENSITIVE Sensitive     CEFTRIAXONE <=1 SENSITIVE Sensitive     CIPROFLOXACIN <=0.25 SENSITIVE Sensitive     GENTAMICIN <=1 SENSITIVE Sensitive     IMIPENEM <=0.25 SENSITIVE Sensitive     NITROFURANTOIN <=16 SENSITIVE Sensitive     TRIMETH/SULFA <=20 SENSITIVE Sensitive     AMPICILLIN/SULBACTAM 4 SENSITIVE Sensitive     PIP/TAZO <=4 SENSITIVE Sensitive     * >=100,000 COLONIES/mL ESCHERICHIA COLI  Surgical pcr screen     Status: None   Collection Time: 07/06/15  5:10 AM  Result Value Ref Range Status   MRSA, PCR NEGATIVE NEGATIVE Final   Staphylococcus aureus NEGATIVE NEGATIVE Final    Comment:        The Xpert SA Assay (FDA approved for NASAL specimens in patients over 23 years of age), is one component of a comprehensive surveillance program.  Test  performance has been validated by [facility] for patients greater than or equal to 44 year old. It is not intended to diagnose infection nor to guide or monitor treatment.     Radiology Reports Dg Chest 2 View  07/03/2015  CLINICAL DATA:  Recent fall. EXAM: CHEST  2 VIEW COMPARISON:  None. FINDINGS: Two views of the chest were obtained. Lungs are clear without airspace disease, pulmonary edema or large pneumothorax. Haziness in the left lower chest probably related to overlying soft tissues. No large pleural effusions. No acute bone abnormalities. IMPRESSION: No active cardiopulmonary disease. Electronically Signed   By: Richarda Overlie M.D.   On: 07/03/2015 21:19   Dg Thoracolumabar Spine  07/06/2015  CLINICAL DATA:  Elective surgery.  T10-11 laminectomy. EXAM: DG C-ARM 61-120 MIN; THORACOLUMBAR SPINE - 2 VIEW COMPARISON:  Thoracic spine MRI 07/04/2015 FINDINGS: Surgical retractor overlaps the T10 and T11 vertebrae based on the lowest ribs. An orogastric tube is present. IMPRESSION: Fluoroscopy for spinal localization. Electronically Signed   By: Marnee Spring M.D.   On: 07/06/2015 19:33   Dg Lumbar Spine Complete  07/03/2015  CLINICAL DATA:  65 year old female with fall and left leg pain. EXAM: LUMBAR SPINE - COMPLETE 4+ VIEW COMPARISON:  None. FINDINGS: There is no acute fracture or subluxation of the lumbar spine. The vertebral body heights and disc spaces are maintained. The visualized transverse and spinous processes are intact. The soft tissues are grossly unremarkable. A small amorphous calcific density in the left hemipelvis most compatible with a calcified fibroid. IMPRESSION: No acute/traumatic lumbar spine pathology. Electronically Signed   By: Elgie Collard M.D.   On: 07/03/2015 21:19   Ct Knee Left Wo Contrast  07/04/2015  CLINICAL DATA:  Larey Seat about a week ago. EXAM: CT OF THE left KNEE WITHOUT CONTRAST TECHNIQUE: Multidetector CT imaging of the left knee was performed according to  the standard protocol. Multiplanar CT image reconstructions were also generated. COMPARISON:  Radiographs 07/03/2015 FINDINGS: There is no tibial plateau fracture. There is slight fragmentation of the lateral tibial spine which may represent a recent avulsion. There is no significant effusion. There is mild prepatellar soft tissue edema. IMPRESSION: No tibial plateau fracture. Slight fragmentation of the lateral tibial  spine might represent a recent avulsion injury. Electronically Signed   By: Ellery Plunk M.D.   On: 07/04/2015 01:00   Mr Thoracic Spine Wo Contrast  07/04/2015  CLINICAL DATA:  Progressive left leg pain and weakness. History of surgery L5-S1 herniated disc. EXAM: MRI THORACIC AND LUMBAR SPINE WITHOUT CONTRAST TECHNIQUE: Multiplanar and multiecho pulse sequences of the thoracic and lumbar spine were obtained without intravenous contrast. COMPARISON:  None. FINDINGS: MR THORACIC SPINE FINDINGS Image quality degraded by motion and patient size. Negative for fracture or mass lesion. Extensive multilevel spondylosis. There is disc degeneration and spurring throughout the thoracic spine. In addition, there is facet hypertrophy throughout the mid and lower thoracic spine. There is mild spinal stenosis at T5-6, T6-7, T7-8, T9-10. Moderately severe spinal stenosis at T10-11 due to vertebral spurring and facet hypertrophy. There is focal hyperintensity in the cord at this level which is ill-defined. This is compatible with myelomalacia which could be acute or chronic. Mild spinal stenosis at T11-T12. MR LUMBAR SPINE FINDINGS S1 is fully lumbarized. There are 6 lumbarized vertebral segments the lowest of which will be labeled S1. This is counting from skullbase down. This places the severe spinal stenosis and cord hyperintensity at T10-11. Normal lumbar alignment. Negative for fracture or mass lesion. Conus medullaris normal and terminates at approximately mid L3. No evidence of tethered cord or  thickening of the filum terminale. Low lying conus medullaris felt to be related to an extra lumbar vertebral segment. L1-2:  Negative L2-3:  Negative L3-4:  Negative L4-5: Mild disc and mild facet degeneration causing mild spinal stenosis L5-S1: Disc and moderate facet degeneration.  Mild spinal stenosis S1-2: Postop changes on the left with epidural scarring. Disc degeneration and spondylosis without disc protrusion. 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 This is causing moderately severe spinal stenosis at T10-11 with focal cord hyperintensity and myelomalacia this level. Cord signal abnormality could be acute or chronic. Multiple additional levels of mild spinal stenosis as above MR LUMBAR SPINE IMPRESSION Six non-rib-bearing lumbar segments, the lowest disc space is labeled S1-2. There has been prior surgery on the left at this level. Mild spinal stenosis at L4-5 and L5-S1. Electronically Signed   By: Marlan Palau M.D.   On: 07/04/2015 17:43   Mr Lumbar Spine Wo Contrast  07/04/2015  CLINICAL DATA:  Progressive left leg pain and weakness. History of surgery L5-S1 herniated disc. EXAM: MRI THORACIC AND LUMBAR SPINE WITHOUT CONTRAST TECHNIQUE: Multiplanar and multiecho pulse sequences of the thoracic and lumbar spine were obtained without intravenous contrast. COMPARISON:  None. FINDINGS: MR THORACIC SPINE FINDINGS Image quality degraded by motion and patient size. Negative for fracture or mass lesion. Extensive multilevel spondylosis. There is disc degeneration and spurring throughout the thoracic spine. In addition, there is facet hypertrophy throughout the mid and lower thoracic spine. There is mild spinal stenosis at T5-6, T6-7, T7-8, T9-10. Moderately severe spinal stenosis at T10-11 due to vertebral spurring and facet hypertrophy. There is focal hyperintensity in the cord at this level which is ill-defined.  This is compatible with myelomalacia which could be acute or chronic. Mild spinal stenosis at T11-T12. MR LUMBAR SPINE FINDINGS S1 is fully lumbarized. There are 6 lumbarized vertebral segments the lowest of which will be labeled S1. This is counting from skullbase down. This places the severe spinal stenosis and cord hyperintensity at T10-11. Normal lumbar alignment. Negative for fracture or mass lesion. Conus medullaris normal  and terminates at approximately mid L3. No evidence of tethered cord or thickening of the filum terminale. Low lying conus medullaris felt to be related to an extra lumbar vertebral segment. L1-2:  Negative L2-3:  Negative L3-4:  Negative L4-5: Mild disc and mild facet degeneration causing mild spinal stenosis L5-S1: Disc and moderate facet degeneration.  Mild spinal stenosis S1-2: Postop changes on the left with epidural scarring. Disc degeneration and spondylosis without disc protrusion. 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 This is causing moderately severe spinal stenosis at T10-11 with focal cord hyperintensity and myelomalacia this level. Cord signal abnormality could be acute or chronic. Multiple additional levels of mild spinal stenosis as above MR LUMBAR SPINE IMPRESSION Six non-rib-bearing lumbar segments, the lowest disc space is labeled S1-2. There has been prior surgery on the left at this level. Mild spinal stenosis at L4-5 and L5-S1. Electronically Signed   By: Marlan Palau M.D.   On: 07/04/2015 17:43   Mr Sacrum/si Joints Wo Contrast  07/04/2015  CLINICAL DATA:  Left leg pain and weakness, progressive.  Diabetes. EXAM: MR SACRUM WITHOUT CONTRAST TECHNIQUE: Multiplanar, multisequence MR imaging was performed. No intravenous contrast was administered. COMPARISON:  07/03/2015 FINDINGS: There is edema tracking in the gluteus medius and minimus muscles, and to a lesser extent in  the gluteus maximus, piriformis, and hip adductor musculature. There is also presacral edema. Given the generalized nature, I favor third spacing of fluid over myositis. A creating kinase level could be utilized to assess for the less likely possibility of myositis. Trace left hip joint effusion. Upper normal amount of fluid in both sacroiliac joints but no overt joint effusion. The appearance is relatively bilaterally symmetric. There is no significant adjacent marrow edema or sclerosis. No erosion along the sacroiliac joints identified. No impingement to the sacral neural foramina. Proximal hamstring tendons appear intact. No discontinuity along the sacrum or coccyx, or compelling findings of sacral insufficiency fracture. IMPRESSION: 1. There is an appearance suggesting third spacing of fluid with presacral edema and edema in the regional musculature in a generalized fashion. Diffuse myositis is a less likely differential diagnostic consideration, correlate with CK level. 2. Trace left hip joint effusion. No overt sacroiliac joint effusion, SI joint erosion, or other findings of sacroiliitis at this time. No sciatic notch impingement. Electronically Signed   By: Gaylyn Rong M.D.   On: 07/04/2015 19:06   Dg Chest Port 1 View  07/06/2015  CLINICAL DATA:  Patient with central line placement. EXAM: PORTABLE CHEST 1 VIEW COMPARISON:  Chest radiograph 07/03/2015 FINDINGS: Right IJ central venous catheter tip projects over the superior vena cava. Multiple monitoring leads overlie the patient. Stable enlarged cardiac and mediastinal contours. No consolidative pulmonary opacities. No pleural effusion or pneumothorax. AC joint degenerative changes. IMPRESSION: Right IJ central venous catheter tip projects over the superior cavoatrial junction. Electronically Signed   By: Annia Belt M.D.   On: 07/06/2015 19:37   Dg Shoulder Left  07/03/2015  CLINICAL DATA:  Fall yesterday and today. Left shoulder pain and  decreased range of motion. Initial encounter. EXAM: LEFT SHOULDER - 2+ VIEW COMPARISON:  None. FINDINGS: There is no evidence of fracture or dislocation. Mild acromioclavicular degenerative spurring noted. No other bone lesions identified. Soft tissues are unremarkable. IMPRESSION: No acute findings.  Mild acromioclavicular DJD. Electronically Signed   By: Myles Rosenthal M.D.   On: 07/03/2015 21:19   Dg Knee Complete 4 Views  Left  07/03/2015  CLINICAL DATA:  Recurrent falls during past 2 days. Left knee pain and limited mobility. Initial encounter. EXAM: LEFT KNEE - COMPLETE 4+ VIEW COMPARISON:  None. FINDINGS: Diffuse soft tissue swelling noted. A tiny avulsion fracture fragment is seen from the lateral tibial spine. No other fractures are identified. Alignment is normal. No evidence of knee joint effusion. IMPRESSION: Diffuse soft tissue swelling. Tiny avulsion fracture fragment from the lateral tibial spine. Electronically Signed   By: Myles Rosenthal M.D.   On: 07/03/2015 21:21   Dg C-arm 1-60 Min  07/06/2015  CLINICAL DATA:  Elective surgery.  T10-11 laminectomy. EXAM: DG C-ARM 61-120 MIN; THORACOLUMBAR SPINE - 2 VIEW COMPARISON:  Thoracic spine MRI 07/04/2015 FINDINGS: Surgical retractor overlaps the T10 and T11 vertebrae based on the lowest ribs. An orogastric tube is present. IMPRESSION: Fluoroscopy for spinal localization. Electronically Signed   By: Marnee Spring M.D.   On: 07/06/2015 19:33   Dg Hip Unilat With Pelvis 2-3 Views Left  07/03/2015  CLINICAL DATA:  65 year old female with fall and left leg pain. EXAM: DG HIP (WITH OR WITHOUT PELVIS) 2-3V LEFT COMPARISON:  None. FINDINGS: There is no acute fracture or dislocation. No significant degenerative changes. A 1.8 x 2.4 cm amorphous calcific density in the left hemipelvis most likely represents a fibroid. The soft tissues appear unremarkable. IMPRESSION: No fracture or dislocation. Electronically Signed   By: Elgie Collard M.D.   On: 07/03/2015  21:17   Dg Femur Min 2 Views Left  07/03/2015  CLINICAL DATA: Fall On Sunday and Monday.  Left leg pain. EXAM: LEFT FEMUR 2 VIEWS COMPARISON:  None. FINDINGS: No acute bony abnormality. Specifically, no fracture, subluxation, or dislocation. Soft tissues are intact. Calcification in the left side of the pelvis, likely fibroid. Vascular calcifications noted within the left thigh. IMPRESSION: No acute bony abnormality. Electronically Signed   By: Charlett Nose M.D.   On: 07/03/2015 21:18    Time Spent in minutes  25 Minutes   Adalae Baysinger M.D on 07/08/2015 at 3:44 PM  Between 7am to 7pm - Pager - [phone]  After 7pm go to [email or web address] - password [facility]  [facility] -  Office  [phone]

## 2015-07-08 NOTE — Progress Notes (Signed)
No acute events Stable papaparesis Incision looks good Stable Therapy

## 2015-07-09 LAB — BASIC METABOLIC PANEL
ANION GAP: 11 (ref 5–15)
BUN: 23 mg/dL — ABNORMAL HIGH (ref 6–20)
CHLORIDE: 106 mmol/L (ref 101–111)
CO2: 26 mmol/L (ref 22–32)
Calcium: 8.5 mg/dL — ABNORMAL LOW (ref 8.9–10.3)
Creatinine, Ser: 1.17 mg/dL — ABNORMAL HIGH (ref 0.44–1.00)
GFR calc non Af Amer: 47 mL/min — ABNORMAL LOW (ref >60)
GFR, EST AFRICAN AMERICAN: 55 mL/min — AB (ref >60)
GLUCOSE: 81 mg/dL (ref 65–99)
Potassium: 3.6 mmol/L (ref 3.5–5.1)
Sodium: 143 mmol/L (ref 135–145)

## 2015-07-09 LAB — GLUCOSE, CAPILLARY
GLUCOSE-CAPILLARY: 125 mg/dL — AB (ref 65–99)
Glucose-Capillary: 74 mg/dL (ref 65–99)
Glucose-Capillary: 158 mg/dL — ABNORMAL HIGH (ref 65–99)
Glucose-Capillary: 178 mg/dL — ABNORMAL HIGH (ref 65–99)

## 2015-07-09 MED ORDER — INSULIN GLARGINE 100 UNIT/ML ~~LOC~~ SOLN
45.0000 [IU] | Freq: Every day | SUBCUTANEOUS | Status: DC
  Administered 2015-07-09 – 2015-07-10 (×2): 45 [IU] via SUBCUTANEOUS
  Filled 2015-07-09 (×2): qty 0.45

## 2015-07-09 NOTE — Progress Notes (Signed)
CBG 74 this AM.  Will hold 8 units meal coverage per Dr. Seth BakeElgergaway.

## 2015-07-09 NOTE — Progress Notes (Signed)
PROGRESS NOTE                                                                                                                                                                                                             Patient Demographics:    [facility], is a 65 y.o. female, DOB - Mar 30, 1950, ZOX:[id number]  Admit date - 07/03/2015   Admitting Physician Alberteen Samhristopher P Danford, MD  Outpatient Primary MD for the patient is [name], CATHLEEN, MD  LOS -     Chief Complaint  Patient presents with  . Fall  . Weakness       Brief Narrative  65 year old female admittedwith chief complaints of acute on chronic left more than right leg weakness, symptoms started relatively suddenly around 6 weeks ago, according to the patient [facility] and she was sent to an SNF without much improvement, [facility], she also says that she had a MRI of her back which was unremarkable.  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  X-ray of the L-spine, pelvis, left knee and left hip unremarkable. There is slight left tibial aversion noted on CT left knee but she is not tender in the left knee area, we will continue to monitor. Dr Bess HarvestPrashant  discussed with orthopedics on call Dr Linna CapriceSwinteck who thinks that the CT left knee findings on the tibia are chronic and likely a bone spur. He recommends PT and supportive care.   [facility]ive:    [facility], No headache, No chest pain, No abdominal pain , complaints of upper back pain and itchingIs improving.   Assessment  & Plan :    Principal Problem:   Inability to walk Active Problems:   UTI (lower urinary tract infection)   Diabetes mellitus with neuropathy (HCC)   Essential  hypertension   Morbid obesity (HCC)   Weakness of left leg   Elevated LFTs   Left leg weakness   Thoracic spinal stenosis  lower extremity weakness ,left> right - Neurology consult appreciated. 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 - Unlikely myositis with normal total CK - Neurosurgery consult appreciated, went for thoracic 10-11 laminectomy for spinal canal decompression on 5/11 by Dr. Mikal Plane  Diabetes mellitus - Patient with low CBG this a.m., and poor appetite, will decrease her Lantus back to home dose 45 units daily, will discontinue  NovoLog 8 units 3 times a day before meals, continue with insulin sliding scale .  AKI on CKD stage III:  - Creatinine peaked at 1.65, significantly improved after holding lisinopril, Toradol and hydrochlorothiazide, discontinue IV fluids .    HTN:  - Continue home amlodipine, stop hydrochlorothiazide and to pill given worsening renal function, blood pressure remains acceptable after starting by mouth hydralazine yesterday.   Elevated LFTs:  - Likely hepatic steatosis.  - Repeat LFT as outpatient with new PCP    UTI - She is asymptomatic, but urine culture growing Escherichia coli more than 100,000 colonies, treated with Rocephin  Hyperkalemia - Given Kayexalate, D50 with IV insulin, albuterol treatment, resolved  - will D/C right IJ TLC prior to D/C tomorrow.  Code Status : Full  Family Communication  : none at bedside  Disposition Plan  : SNF placment in am.  Consults  :  Neurology ,  neurosurgery  Procedures  : thoracic 10-11 laminectomy for spinal canal decompression on 5/11 by Dr. Mikal Plane  DVT Prophylaxis  :  Lovenox, SCD.  Lab Results  Component Value Date   PLT 250 07/08/2015    Antibiotics  :    Anti-infectives    Start     Dose/Rate Route Frequency Ordered Stop    07/05/15 1400  cefTRIAXone (ROCEPHIN) 1 g in dextrose 5 % 50 mL IVPB  Status:  Discontinued     1 g 100 mL/hr over 30 Minutes Intravenous Every 24 hours 07/05/15 1352 07/08/15 0804   07/04/15 0300  cephALEXin (KEFLEX) capsule 500 mg  Status:  Discontinued     500 mg Oral Every 12 hours 07/04/15 0256 07/04/15 0320   07/03/15 2115  cefTRIAXone (ROCEPHIN) 1 g in dextrose 5 % 50 mL IVPB     1 g 100 mL/hr over 30 Minutes Intravenous  Once 07/03/15 2106 07/04/15 0045        Objective:   Filed Vitals:   07/08/15 2222 07/08/15 2349 07/09/15 0531 07/09/15 1110  BP: 150/58 135/52 129/42 120/50  Pulse: 87 100 83 80  Temp: 99.7 F (37.6 C) 99.9 F (37.7 C) 98.6 F (37 C) 98.5 F (36.9 C)  TempSrc: Oral Oral Oral Oral  Resp: Height:      Weight:      SpO2: 97% 96% 95% 95%    Wt Readings from Last 3 Encounters:  07/07/15 126 kg (277 lb 12.5 oz)     Intake/Output Summary (Last 24 hours) at 07/09/15 1231 Last data filed at 07/09/15 1030  Gross per 24 hour  Intake   2490 ml  Output    200 ml  Net   2290 ml     Physical Exam  Awake Alert, Oriented X 3,   [place].AT,PERRAL Supple Neck,No JVD,  Symmetrical Chest wall movement, Good air movement bilaterally, CTAB RRR,No Gallops,Rubs or new Murmurs, No Parasternal Heave +ve B.Sounds, Abd Soft, No tenderness, No rebound - guarding or rigidity. No Cyanosis, Clubbing or edema,left lower ext weakness    Data Review:    CBC  Recent Labs Lab 07/03/15 2202 07/04/15 0735 07/06/15 0419 07/07/15 0548 07/08/15 0430  WBC  6.6 6.1 5.3 10.8* 9.6  HGB 12.3 11.4* 11.3* 12.4 10.9*  HCT 38.9 36.2 35.2* 38.6 33.8*  PLT 327 277 258 300 250  MCV 90.9 91.6 89.6 87.5 90.4  MCH 28.7 28.9 28.8 28.1 29.1  MCHC 31.6 31.5 32.1 32.1 32.2  RDW 14.0 13.9 13.4 13.2 13.8  LYMPHSABS 2.9  --   --   --   --   MONOABS 0.6  --   --   --   --   EOSABS 0.3  --   --   --   --   BASOSABS 0.0  --   --   --   --     Chemistries   Recent  Labs Lab 07/03/15 2202 07/04/15 0735 07/06/15 0419 07/07/15 0548 07/07/15 1303 07/08/15 0430 07/09/15 0710  NA 140 141 140 137  --  143 143  K 4.5 4.5 4.2 6.2* 4.9 3.8 3.6  CL 102 106 103 99*  --  103 106  CO2 25 24 23 22   --  27 26  GLUCOSE 180* 271* 179* 320*  --  100* 81  BUN 19 21* 9 16  --  26* 23*  CREATININE 1.27* 1.24*  1.23* 0.87 1.13*  --  1.64* 1.17*  CALCIUM 10.0 9.5 9.7 9.6  --  8.8* 8.5*  AST 66* 59*  --   --   --   --   --   ALT 58* 53  --   --   --   --   --   ALKPHOS 70 62  --   --   --   --   --   BILITOT 0.7 0.6  --   --   --   --   --    ------------------------------------------------------------------------------------------------------------------ No results for input(s): CHOL, HDL, LDLCALC, TRIG, CHOLHDL, LDLDIRECT in the last 72 hours.  Lab Results  Component Value Date   HGBA1C 9.7* 07/04/2015   ------------------------------------------------------------------------------------------------------------------ No results for input(s): TSH, T4TOTAL, T3FREE, THYROIDAB in the last 72 hours.  Invalid input(s): FREET3 ------------------------------------------------------------------------------------------------------------------ No results for input(s): VITAMINB12, FOLATE, FERRITIN, TIBC, IRON, RETICCTPCT in the last 72 hours.  Coagulation profile  Recent Labs Lab 07/06/15 0419  INR 1.03    No results for input(s): DDIMER in the last 72 hours.  Cardiac Enzymes No results for input(s): CKMB, TROPONINI, MYOGLOBIN in the last 168 hours.  Invalid input(s): CK ------------------------------------------------------------------------------------------------------------------ No results found for: BNP  Inpatient Medications  Scheduled Meds: . amLODipine  10 mg Oral Daily  . aspirin EC  81 mg Oral QHS  . enoxaparin (LOVENOX) injection  40 mg Subcutaneous Q24H  . hydrALAZINE  25 mg Oral Q8H  . insulin aspart  0-15 Units Subcutaneous TID WC  .  insulin aspart  0-5 Units Subcutaneous QHS  . insulin glargine  45 Units Subcutaneous Daily  . polyethylene glycol  34 g Oral BID  . pravastatin  40 mg Oral QHS  . senna  1 tablet Oral BID  . sodium chloride flush  3 mL Intravenous Q12H  . sodium phosphate  1 enema Rectal Once   Continuous Infusions: . sodium chloride     PRN Meds:.acetaminophen **OR** acetaminophen, bisacodyl, diazepam, diphenhydrAMINE, HYDROcodone-acetaminophen, HYDROmorphone (DILAUDID) injection, magnesium citrate, menthol-cetylpyridinium **OR** phenol, ondansetron **OR** ondansetron (ZOFRAN) IV, oxyCODONE-acetaminophen, senna-docusate, sodium chloride flush, sodium chloride flush, zolpidem  Micro Results Recent Results (from the past 240 hour(s))  Urine culture     Status: Abnormal   Collection Time: 07/03/15  7:41 PM  Result Value Ref Range Status   Specimen Description URINE, CLEAN CATCH  Final   Special Requests NONE  Final   Culture >=100,000 COLONIES/mL ESCHERICHIA COLI (A)  Final   Report Status 07/06/2015 FINAL  Final   Organism ID, Bacteria ESCHERICHIA COLI (A)  Final      Susceptibility   Escherichia coli - MIC*    AMPICILLIN 4 SENSITIVE Sensitive     CEFAZOLIN <=4 SENSITIVE Sensitive     CEFTRIAXONE <=1 SENSITIVE Sensitive     CIPROFLOXACIN <=0.25 SENSITIVE Sensitive     GENTAMICIN <=1 SENSITIVE Sensitive     IMIPENEM <=0.25 SENSITIVE Sensitive     NITROFURANTOIN <=16 SENSITIVE Sensitive     TRIMETH/SULFA <=20 SENSITIVE Sensitive     AMPICILLIN/SULBACTAM 4 SENSITIVE Sensitive     PIP/TAZO <=4 SENSITIVE Sensitive     * >=100,000 COLONIES/mL ESCHERICHIA COLI  Surgical pcr screen     Status: None   Collection Time: 07/06/15  5:10 AM  Result Value Ref Range Status   MRSA, PCR NEGATIVE NEGATIVE Final   Staphylococcus aureus NEGATIVE NEGATIVE Final    Comment:        The Xpert SA Assay (FDA approved for NASAL specimens in patients over 41 years of age), is one component of a comprehensive  surveillance program.  Test performance has been validated by [facility] for patients greater than or equal to 18 year old. It is not intended to diagnose infection nor to guide or monitor treatment.     Radiology Reports Dg Chest 2 View  07/03/2015  CLINICAL DATA:  Recent fall. EXAM: CHEST  2 VIEW COMPARISON:  None. FINDINGS: Two views of the chest were obtained. Lungs are clear without airspace disease, pulmonary edema or large pneumothorax. Haziness in the left lower chest probably related to overlying soft tissues. No large pleural effusions. No acute bone abnormalities. IMPRESSION: No active cardiopulmonary disease. Electronically Signed   By: Richarda Overlie M.D.   On: 07/03/2015 21:19   Dg Thoracolumabar Spine  07/06/2015  CLINICAL DATA:  Elective surgery.  T10-11 laminectomy. EXAM: DG C-ARM 61-120 MIN; THORACOLUMBAR SPINE - 2 VIEW COMPARISON:  Thoracic spine MRI 07/04/2015 FINDINGS: Surgical retractor overlaps the T10 and T11 vertebrae based on the lowest ribs. An orogastric tube is present. IMPRESSION: Fluoroscopy for spinal localization. Electronically Signed   By: Marnee Spring M.D.   On: 07/06/2015 19:33   Dg Lumbar Spine Complete  07/03/2015  CLINICAL DATA:  65 year old female with fall and left leg pain. EXAM: LUMBAR SPINE - COMPLETE 4+ VIEW COMPARISON:  None. FINDINGS: There is no acute fracture or subluxation of the lumbar spine. The vertebral body heights and disc spaces are maintained. The visualized transverse and spinous processes are intact. The soft tissues are grossly unremarkable. A small amorphous calcific density in the left hemipelvis most compatible with a calcified fibroid. IMPRESSION: No acute/traumatic lumbar spine pathology. Electronically Signed   By: Elgie Collard M.D.   On: 07/03/2015 21:19   Ct Knee Left Wo Contrast  07/04/2015  CLINICAL DATA:  Larey Seat about a week ago. EXAM: CT OF THE left KNEE WITHOUT CONTRAST TECHNIQUE: Multidetector CT imaging of the left knee  was performed according to the standard protocol. Multiplanar CT image reconstructions were also generated. COMPARISON:  Radiographs 07/03/2015 FINDINGS: There is no tibial plateau fracture. There is slight fragmentation of the lateral tibial spine which may represent a recent avulsion. There is no significant effusion. There is mild prepatellar soft tissue edema. IMPRESSION: No tibial plateau  fracture. Slight fragmentation of the lateral tibial spine might represent a recent avulsion injury. Electronically Signed   By: Ellery Plunkaniel R Mitchell M.D.   On: 07/04/2015 01:00   Mr Thoracic Spine Wo Contrast  07/04/2015  CLINICAL DATA:  Progressive left leg pain and weakness. History of surgery L5-S1 herniated disc. EXAM: MRI THORACIC AND LUMBAR SPINE WITHOUT CONTRAST TECHNIQUE: Multiplanar and multiecho pulse sequences of the thoracic and lumbar spine were obtained without intravenous contrast. COMPARISON:  None. FINDINGS: MR THORACIC SPINE FINDINGS Image quality degraded by motion and patient size. Negative for fracture or mass lesion. Extensive multilevel spondylosis. There is disc degeneration and spurring throughout the thoracic spine. In addition, there is facet hypertrophy throughout the mid and lower thoracic spine. There is mild spinal stenosis at T5-6, T6-7, T7-8, T9-10. Moderately severe spinal stenosis at T10-11 due to vertebral spurring and facet hypertrophy. There is focal hyperintensity in the cord at this level which is ill-defined. This is compatible with myelomalacia which could be acute or chronic. Mild spinal stenosis at T11-T12. MR LUMBAR SPINE FINDINGS S1 is fully lumbarized. There are 6 lumbarized vertebral segments the lowest of which will be labeled S1. This is counting from skullbase down. This places the severe spinal stenosis and cord hyperintensity at T10-11. Normal lumbar alignment. Negative for fracture or mass lesion. Conus medullaris normal and terminates at approximately mid L3. No evidence  of tethered cord or thickening of the filum terminale. Low lying conus medullaris felt to be related to an extra lumbar vertebral segment. L1-2:  Negative L2-3:  Negative L3-4:  Negative L4-5: Mild disc and mild facet degeneration causing mild spinal stenosis L5-S1: Disc and moderate facet degeneration.  Mild spinal stenosis S1-2: Postop changes on the left with epidural scarring. Disc degeneration and spondylosis without disc protrusion. 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 This is causing moderately severe spinal stenosis at T10-11 with focal cord hyperintensity and myelomalacia this level. Cord signal abnormality could be acute or chronic. Multiple additional levels of mild spinal stenosis as above MR LUMBAR SPINE IMPRESSION Six non-rib-bearing lumbar segments, the lowest disc space is labeled S1-2. There has been prior surgery on the left at this level. Mild spinal stenosis at L4-5 and L5-S1. Electronically Signed   By: Marlan Palauharles  Clark M.D.   On: 07/04/2015 17:43   Mr Lumbar Spine Wo Contrast  07/04/2015  CLINICAL DATA:  Progressive left leg pain and weakness. History of surgery L5-S1 herniated disc. EXAM: MRI THORACIC AND LUMBAR SPINE WITHOUT CONTRAST TECHNIQUE: Multiplanar and multiecho pulse sequences of the thoracic and lumbar spine were obtained without intravenous contrast. COMPARISON:  None. FINDINGS: MR THORACIC SPINE FINDINGS Image quality degraded by motion and patient size. Negative for fracture or mass lesion. Extensive multilevel spondylosis. There is disc degeneration and spurring throughout the thoracic spine. In addition, there is facet hypertrophy throughout the mid and lower thoracic spine. There is mild spinal stenosis at T5-6, T6-7, T7-8, T9-10. Moderately severe spinal stenosis at T10-11 due to vertebral spurring and facet hypertrophy. There is focal hyperintensity in the cord at this level  which is ill-defined. This is compatible with myelomalacia which could be acute or chronic. Mild spinal stenosis at T11-T12. MR LUMBAR SPINE FINDINGS S1 is fully lumbarized. There are 6 lumbarized vertebral segments the lowest of which will be labeled S1. This is counting from skullbase down. This places the severe spinal stenosis and cord hyperintensity at T10-11. Normal lumbar alignment. Negative for  fracture or mass lesion. Conus medullaris normal and terminates at approximately mid L3. No evidence of tethered cord or thickening of the filum terminale. Low lying conus medullaris felt to be related to an extra lumbar vertebral segment. L1-2:  Negative L2-3:  Negative L3-4:  Negative L4-5: Mild disc and mild facet degeneration causing mild spinal stenosis L5-S1: Disc and moderate facet degeneration.  Mild spinal stenosis S1-2: Postop changes on the left with epidural scarring. Disc degeneration and spondylosis without disc protrusion. 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 This is causing moderately severe spinal stenosis at T10-11 with focal cord hyperintensity and myelomalacia this level. Cord signal abnormality could be acute or chronic. Multiple additional levels of mild spinal stenosis as above MR LUMBAR SPINE IMPRESSION Six non-rib-bearing lumbar segments, the lowest disc space is labeled S1-2. There has been prior surgery on the left at this level. Mild spinal stenosis at L4-5 and L5-S1. Electronically Signed   By: Marlan Palau M.D.   On: 07/04/2015 17:43   Mr Sacrum/si Joints Wo Contrast  07/04/2015  CLINICAL DATA:  Left leg pain and weakness, progressive.  Diabetes. EXAM: MR SACRUM WITHOUT CONTRAST TECHNIQUE: Multiplanar, multisequence MR imaging was performed. No intravenous contrast was administered. COMPARISON:  07/03/2015 FINDINGS: There is edema tracking in the gluteus medius and minimus muscles, and  to a lesser extent in the gluteus maximus, piriformis, and hip adductor musculature. There is also presacral edema. Given the generalized nature, I favor third spacing of fluid over myositis. A creating kinase level could be utilized to assess for the less likely possibility of myositis. Trace left hip joint effusion. Upper normal amount of fluid in both sacroiliac joints but no overt joint effusion. The appearance is relatively bilaterally symmetric. There is no significant adjacent marrow edema or sclerosis. No erosion along the sacroiliac joints identified. No impingement to the sacral neural foramina. Proximal hamstring tendons appear intact. No discontinuity along the sacrum or coccyx, or compelling findings of sacral insufficiency fracture. IMPRESSION: 1. There is an appearance suggesting third spacing of fluid with presacral edema and edema in the regional musculature in a generalized fashion. Diffuse myositis is a less likely differential diagnostic consideration, correlate with CK level. 2. Trace left hip joint effusion. No overt sacroiliac joint effusion, SI joint erosion, or other findings of sacroiliitis at this time. No sciatic notch impingement. Electronically Signed   By: Gaylyn Rong M.D.   On: 07/04/2015 19:06   Dg Chest Port 1 View  07/06/2015  CLINICAL DATA:  Patient with central line placement. EXAM: PORTABLE CHEST 1 VIEW COMPARISON:  Chest radiograph 07/03/2015 FINDINGS: Right IJ central venous catheter tip projects over the superior vena cava. Multiple monitoring leads overlie the patient. Stable enlarged cardiac and mediastinal contours. No consolidative pulmonary opacities. No pleural effusion or pneumothorax. AC joint degenerative changes. IMPRESSION: Right IJ central venous catheter tip projects over the superior cavoatrial junction. Electronically Signed   By: Annia Belt M.D.   On: 07/06/2015 19:37   Dg Shoulder Left  07/03/2015  CLINICAL DATA:  Fall yesterday and today. Left  shoulder pain and decreased range of motion. Initial encounter. EXAM: LEFT SHOULDER - 2+ VIEW COMPARISON:  None. FINDINGS: There is no evidence of fracture or dislocation. Mild acromioclavicular degenerative spurring noted. No other bone lesions identified. Soft tissues are unremarkable. IMPRESSION: No acute findings.  Mild acromioclavicular DJD. Electronically Signed   By: Myles Rosenthal M.D.   On: 07/03/2015 21:19  Dg Knee Complete 4 Views Left  07/03/2015  CLINICAL DATA:  Recurrent falls during past 2 days. Left knee pain and limited mobility. Initial encounter. EXAM: LEFT KNEE - COMPLETE 4+ VIEW COMPARISON:  None. FINDINGS: Diffuse soft tissue swelling noted. A tiny avulsion fracture fragment is seen from the lateral tibial spine. No other fractures are identified. Alignment is normal. No evidence of knee joint effusion. IMPRESSION: Diffuse soft tissue swelling. Tiny avulsion fracture fragment from the lateral tibial spine. Electronically Signed   By: Myles Rosenthal M.D.   On: 07/03/2015 21:21   Dg C-arm 1-60 Min  07/06/2015  CLINICAL DATA:  Elective surgery.  T10-11 laminectomy. EXAM: DG C-ARM 61-120 MIN; THORACOLUMBAR SPINE - 2 VIEW COMPARISON:  Thoracic spine MRI 07/04/2015 FINDINGS: Surgical retractor overlaps the T10 and T11 vertebrae based on the lowest ribs. An orogastric tube is present. IMPRESSION: Fluoroscopy for spinal localization. Electronically Signed   By: Marnee Spring M.D.   On: 07/06/2015 19:33   Dg Hip Unilat With Pelvis 2-3 Views Left  07/03/2015  CLINICAL DATA:  65 year old female with fall and left leg pain. EXAM: DG HIP (WITH OR WITHOUT PELVIS) 2-3V LEFT COMPARISON:  None. FINDINGS: There is no acute fracture or dislocation. No significant degenerative changes. A 1.8 x 2.4 cm amorphous calcific density in the left hemipelvis most likely represents a fibroid. The soft tissues appear unremarkable. IMPRESSION: No fracture or dislocation. Electronically Signed   By: Elgie Collard M.D.    On: 07/03/2015 21:17   Dg Femur Min 2 Views Left  07/03/2015  CLINICAL DATA: Fall On Sunday and Monday.  Left leg pain. EXAM: LEFT FEMUR 2 VIEWS COMPARISON:  None. FINDINGS: No acute bony abnormality. Specifically, no fracture, subluxation, or dislocation. Soft tissues are intact. Calcification in the left side of the pelvis, likely fibroid. Vascular calcifications noted within the left thigh. IMPRESSION: No acute bony abnormality. Electronically Signed   By: Charlett Nose M.D.   On: 07/03/2015 21:18    Time Spent in minutes  25 Minutes   ELGERGAWY, DAWOOD M.D on 07/09/2015 at 12:31 PM  Between 7am to 7pm - Pager - [phone]  After 7pm go to [email or web address] - password [facility]  [facility] -  Office  [phone]

## 2015-07-09 NOTE — Clinical Social Work Note (Signed)
Clinical Social Worker continuing to follow patient and family for support and discharge planning needs.  [facility] plans to return to [facility].  CSW remains available for support and to facilitate patient discharge needs once appropriate.  Macario GoldsJesse Skylin Kennerson, LCSW (Weekend Coverage Only) [phone]

## 2015-07-09 NOTE — Progress Notes (Signed)
No acute events Doing well Left leg slightly improved at hip flexion, now 2/5 Otherwise unchanged Slow progress Therapy

## 2015-07-09 NOTE — Progress Notes (Signed)
Patient is c/o bilateral lower extremity coldness radiating from top of thighs to toes.  She rates coldness to RLE as 8/10.  She rates LLE coldness as 5/10.  She does not c/o of numbness, tingling, or pain.  Both LE are warm to touch.  Palpable dorsalis pedal pulse bilaterally.  LLE is still weaker than RLE - no change from previous assessments.  Called and made [facility] aware.  No further orders at this time.  Upon follow up with patient, coldness is better.  Will continue to monitor patient.  [employer] [employer] MinorKimberly Melinda Gwinner RN-BC, WTA.

## 2015-07-10 DIAGNOSIS — M4804 Spinal stenosis, thoracic region: Principal | ICD-10-CM

## 2015-07-10 LAB — GLUCOSE, CAPILLARY
Glucose-Capillary: 142 mg/dL — ABNORMAL HIGH (ref 65–99)
Glucose-Capillary: 162 mg/dL — ABNORMAL HIGH (ref 65–99)
Glucose-Capillary: 142 mg/dL — ABNORMAL HIGH (ref 65–99)

## 2015-07-10 MED ORDER — SENNOSIDES-DOCUSATE SODIUM 8.6-50 MG PO TABS: 1.0000 | ORAL_TABLET | Freq: Two times a day (BID) | ORAL | Status: DC

## 2015-07-10 MED ORDER — INSULIN GLARGINE 100 UNIT/ML ~~LOC~~ SOLN: 45.0000 [IU] | Freq: Every day | SUBCUTANEOUS | Status: DC

## 2015-07-10 MED ORDER — OXYCODONE-ACETAMINOPHEN 5-325 MG PO TABS: 1.0000 | ORAL_TABLET | Freq: Four times a day (QID) | ORAL | Status: DC | PRN

## 2015-07-10 MED ORDER — POLYETHYLENE GLYCOL 3350 17 G PO PACK: 17.0000 g | PACK | Freq: Every day | ORAL | Status: DC | PRN

## 2015-07-10 MED ORDER — INSULIN ASPART 100 UNIT/ML ~~LOC~~ SOLN: 0.0000 [IU] | Freq: Three times a day (TID) | SUBCUTANEOUS | Status: DC

## 2015-07-10 NOTE — Discharge Instructions (Signed)
Follow with Primary MD Cassandra Stewart, CATHLEEN, MD in 7 days   Get CBC, CMP,checked  by Primary MD next visit.    Activity: As tolerated with Full fall precautions use walker/cane & assistance as needed   Disposition SNF   Diet: Heart Healthy, carbohydrate modified  , with feeding assistance and aspiration precautions.  For Heart failure patients - Check your Weight same time everyday, if you gain over 2 pounds, or you develop in leg swelling, experience more shortness of breath or chest pain, call your Primary MD immediately. Follow Cardiac Low Salt Diet and 1.5 lit/day fluid restriction.   On your next visit with your primary care physician please Get Medicines reviewed and adjusted.   Please request your Prim.MD to go over all Hospital Tests and Procedure/Radiological results at the follow up, please get all Hospital records sent to your Prim MD by signing hospital release before you go home.   If you experience worsening of your admission symptoms, develop shortness of breath, life threatening emergency, suicidal or homicidal thoughts you must seek medical attention immediately by calling 911 or calling your MD immediately  if symptoms less severe.  You Must read complete instructions/literature along with all the possible adverse reactions/side effects for all the Medicines you take and that have been prescribed to you. Take any new Medicines after you have completely understood and accpet all the possible adverse reactions/side effects.   Do not drive, operating heavy machinery, perform activities at heights, swimming or participation in water activities or provide baby sitting services if your were admitted for syncope or siezures until you have seen by Primary MD or a Neurologist and advised to do so again.  Do not drive when taking Pain medications.    Do not take more than prescribed Pain, Sleep and Anxiety Medications  Special Instructions: If you have smoked or chewed Tobacco   in the last 2 yrs please stop smoking, stop any regular Alcohol  and or any Recreational drug use.  Wear Seat belts while driving.   Please note  You were cared for by a hospitalist during your hospital stay. If you have any questions about your discharge medications or the care you received while you were in the hospital after you are discharged, you can call the unit and asked to speak with the hospitalist on call if the hospitalist that took care of you is not available. Once you are discharged, your primary care physician will handle any further medical issues. Please note that NO REFILLS for any discharge medications will be authorized once you are discharged, as it is imperative that you return to your primary care physician (or establish a relationship with a primary care physician if you do not have one) for your aftercare needs so that they can reassess your need for medications and monitor your lab values.

## 2015-07-10 NOTE — Progress Notes (Signed)
Spoke with Ortho tech.  PRAFO boot will not be here until tomorrow.  Dr. Seth BakeElgergaway notified. Ok to discharge to SNF without it.

## 2015-07-10 NOTE — Progress Notes (Signed)
Patient Discharge: Disposition: Patient is discharged to SNF via non-emergency ambulance transport. Education: Education given. Paperwork sent with Patient IV: All lines removed. Telemetry: Discontinued before discharge, CCMD notified. Belongings: Patient took all his belongings.

## 2015-07-10 NOTE — Progress Notes (Signed)
[facility].  All questions answered.

## 2015-07-10 NOTE — Progress Notes (Signed)
Physical Therapy Treatment Patient Details Name: Margit Batte MRN: [id number] DOB: 1950/08/25 Today's Date: 07/10/2015    History of Present Illness HPI: Ryley Teater is a 65 y.o. female with a past medical history significant for IDDM, MO, and HTN who presents with fall from leg weakness and inability to stand; mid-February when she had somewhat abrupt onset of left leg weakness, etiology unclear (recent scans reveal brain and spine normal); one hosptialization for UTI, AKI since; 21 day stay at SNF for rehab, home 2 days, essentially wheelchair bound; Radiograph of the left knee showed possible avulsion fracture, which was also imaged on CT LEFT knee.  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    PT Comments    Continuing progress towards PT goals; Overall progressing well; Anticipate continuing good progress at post-acute rehabilitation.   Follow Up Recommendations  SNF     Equipment Recommendations   (to be determined at next venue of care)    Recommendations for Other Services       Precautions / Restrictions Precautions Precautions: Fall;Back Restrictions Weight Bearing Restrictions: No    Mobility  Bed Mobility Overal bed mobility: Needs Assistance (Simultaneous filing. User may not have seen previous data.) Bed Mobility: Rolling;Sidelying to Sit (Simultaneous filing. User may not have seen previous data.) Rolling: Mod assist (Simultaneous filing. User may not have seen previous data.) Sidelying to sit: Mod assist (Simultaneous filing. User may not have seen previous data.)       General bed mobility comments: Mod assist to initiate roll and used bed pad to assist stacking hips for full sidelying; Heavy mod assist to elevate trunk to fully upright sitting; continued need for assist to clear LLE from EOB  Transfers Overall transfer level: Needs assistance (Simultaneous filing.  User may not have seen previous data.)   Transfers: Lateral/Scoot Transfers (Simultaneous filing. User may not have seen previous data.)          Lateral/Scoot Transfers: Mod assist;+2 physical assistance;With slide board General transfer comment: Multimodal cues to shift weight anteriorly to unweigh hips for scooting; needing second person to steady recliner chair so it would not slip  Ambulation/Gait                 Stairs            Wheelchair Mobility    Modified Rankin (Stroke Patients Only)       Balance     Sitting balance-Leahy Scale: Fair                              Cognition Arousal/Alertness: Awake/alert Behavior During Therapy: WFL for tasks assessed/performed Overall Cognitive Status: Within Functional Limits for tasks assessed                      Exercises Other Exercises Other Exercises: Educated pt in perfoming L calf and heel cord stretch to prevent foot drop and heel cord tightness; good performance of self-stretch    General Comments        Pertinent Vitals/Pain Pain Assessment: Faces Faces Pain Scale: Hurts little more Pain Location: abdomen Pain Descriptors / Indicators: Jabbing;Grimacing Pain Intervention(s): Limited activity within patient's tolerance;Other (comment) (no pain at end of sesion)    Home Living Family/patient expects to be discharged to:: Skilled nursing facility  Prior Function Level of Independence: Needs assistance      Comments: States she was at home less than 24 hr when she had a fall and returned to the hospital. States she stayed in the recliner and wasn't able to get out of the chair and so used a diaper. States she fell out of the recliner trying to get to her w/c.   PT Goals (current goals can now be found in the care plan section) Acute Rehab PT Goals Patient Stated Goal: Back to rehab to get stronger (Simultaneous filing. User may not have seen  previous data.) PT Goal Formulation: With patient Time For Goal Achievement: 07/22/15 Potential to Achieve Goals: Good Progress towards PT goals: Progressing toward goals    Frequency  Min 3X/week    PT Plan Current plan remains appropriate     Van ClinesHolly Lorelee Mclaurin, PT  Acute Rehabilitation Services Pager [phone] Office [phone] Co-evaluation PT/OT/SLP Co-Evaluation/Treatment: Yes Reason for Co-Treatment: For patient/therapist safety (parital session) PT goals addressed during session: Mobility/safety with mobility OT goals addressed during session: ADL's and self-care     End of Session   Activity Tolerance: Patient tolerated treatment well Patient left: in chair;with call bell/phone within reach     Time: 0847-0910 PT Time Calculation (min) (ACUTE ONLY): 23 min  Charges:  $Therapeutic Activity: 8-22 mins                    G Codes:      Olen PelGarrigan, Kyion Gautier Hamff 07/10/2015, 11:41 AM  Van ClinesHolly Janique Hoefer, PT  Acute Rehabilitation Services Pager [phone] Office [phone]

## 2015-07-10 NOTE — Progress Notes (Signed)
[facility]: [facility] Anticipated DC date: 07/10/15 Family notified: Patient contacting her family Transport by: Sharin MonsPTAR (RN to call once Ortho brings Boot to patient)   Per MD [facility]. RN, patient, and facility notified of DC. RN given number for report and given ambulance after-hours number. DC packet on chart.   CSW signing off.  Cristobal GoldmannNadia Danuta Huseman, [place] Clinical Social Worker [phone]

## 2015-07-10 NOTE — Progress Notes (Signed)
Occupational Therapy Evaluation Patient Details Name: Cassandra Stewart Orantes MRN: [id number] DOB: Jul 06, 1950 Today's Date: 07/10/2015    History of Present Illness HPI: Cassandra Stewart Alvelo is a 65 y.o. female with a past medical history significant for IDDM, MO, and HTN who presents with fall from leg weakness and inability to stand; mid-February when she had somewhat abrupt onset of left leg weakness, etiology unclear (recent scans reveal brain and spine normal); one hosptialization for UTI, AKI since; 21 day stay at SNF for rehab, home 2 days, essentially wheelchair bound; Radiograph of the left knee showed possible avulsion fracture, which was also imaged on CT LEFT knee.  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   Clinical Impression   PTA, pt at home for less than 24 hrs before having a fall from her recliner, where she stayed since D/C from SNF. Pt will need rehab at SNF prior to D/C home. Pt has L footdrop and will need a PRAFO boot prior to D/C to SNF. Anticipate D/C to SNF today per pt.    Follow Up Recommendations  SNF;Supervision/Assistance - 24 hour    Equipment Recommendations  Other (comment) (TBA at SNF)    Recommendations for Other Services       Precautions / Restrictions Precautions Precautions: Fall;Back      Mobility Bed Mobility Overal bed mobility: Needs Assistance Bed Mobility: Rolling;Sidelying to Sit Rolling: Min assist Sidelying to sit: Mod assist          Transfers Overall transfer level: Needs assistance   Transfers: Lateral/Scoot Transfers          Lateral/Scoot Transfers: Mod assist      Balance     Sitting balance-Leahy Scale: Fair                                      ADL Overall ADL's : Needs assistance/impaired     Grooming: Set up;Sitting   Upper Body Bathing: Set up;Sitting   Lower Body Bathing: Maximal  assistance;Sitting/lateral leans   Upper Body Dressing : Set up;Sitting   Lower Body Dressing: Maximal assistance;Sitting/lateral leans   Toilet Transfer: Moderate assistance;Squat-pivot;Requires drop arm           Functional mobility during ADLs: Moderate assistance;Cueing for safety;Cueing for sequencing (lateral scoot.)       Vision     Perception     Praxis      Pertinent Vitals/Pain Pain Assessment: Faces Faces Pain Scale: Hurts little more Pain Location: abdomen Pain Descriptors / Indicators: Jabbing;Grimacing Pain Intervention(s): Limited activity within patient's tolerance;Other (comment) (no pain at end of sesion)     Hand Dominance     Extremity/Trunk Assessment Upper Extremity Assessment Upper Extremity Assessment: Generalized weakness   Lower Extremity Assessment Lower Extremity Assessment: Defer to PT evaluation LLE Deficits / Details: LLE overall weaker than R. L foot drop       Communication     Cognition Arousal/Alertness: Awake/alert Behavior During Therapy: WFL for tasks assessed/performed Overall Cognitive Status: Within Functional Limits for tasks assessed                     General Comments       Exercises       Shoulder Instructions      Home Living Family/patient expects to be discharged to:: Skilled nursing facility  Prior Functioning/Environment Level of Independence: Needs assistance        Comments: States she was at home less than 24 hr when she had a fall and returned to the hospital. States she stayed in the recliner and wasn't able to get out of the chair and so used a diaper. States she fell out of the recliner trying to get to her w/c.    OT Diagnosis: Generalized weakness;Acute pain   OT Problem List: Decreased strength;Decreased range of motion;Decreased activity tolerance;Impaired balance (sitting and/or standing);Decreased safety  awareness;Decreased knowledge of use of DME or AE;Obesity;Pain   OT Treatment/Interventions: Self-care/ADL training;Therapeutic exercise;Energy conservation;DME and/or AE instruction;Therapeutic activities;Patient/family education;Balance training    OT Goals(Current goals can be found in the care plan section) Acute Rehab OT Goals Patient Stated Goal: to get stronger and be able to take care of self OT Goal Formulation: With patient Time For Goal Achievement: 07/24/15 Potential to Achieve Goals: Good  OT Frequency: Min 2X/week   Barriers to D/C: Decreased caregiver support          Co-evaluation PT/OT/SLP Co-Evaluation/Treatment: Yes Reason for Co-Treatment: For patient/therapist safety (parital session)   OT goals addressed during session: ADL's and self-care      End of Session Nurse Communication: Mobility status  Activity Tolerance: Patient tolerated treatment well Patient left: in chair;with call bell/phone within reach   Time: 1610-9604 OT Time Calculation (min): 18 min Charges:  OT General Charges $OT Visit: 1 Procedure OT Evaluation $OT Eval Moderate Complexity: 1 Procedure G-Codes:    Edelmiro Innocent,HILLARY Jul 24, 2015, 10:55 AM   Luisa Dago, OTR/L  [phone] Jul 24, 2015

## 2015-07-10 NOTE — Discharge Summary (Signed)
Cassandra Stewart, is a 65 y.o. female  DOB [birth year]  MRN [id number].  Admission date:  07/03/2015  Admitting Physician  Alberteen Sam, MD  Discharge Date:  07/10/2015   Primary MD  Phyllis Ginger, MD  Recommendations for primary care physician for things to follow:  - Check CBC, BMP in 3 days. - Patient to follow with neurosurgery Dr. Franky Macho as an outpatient   Admission Diagnosis  UTI (lower urinary tract infection) [N39.0] Fall [W19.XXXA] Avulsion fracture of lateral condyle of tibia, left, closed, initial encounter [S82.122A]   Discharge Diagnosis  UTI (lower urinary tract infection) [N39.0] Fall [W19.XXXA] Avulsion fracture of lateral condyle of tibia, left, closed, initial encounter [S82.122A]    Principal Problem:   Inability to walk Active Problems:   UTI (lower urinary tract infection)   Diabetes mellitus with neuropathy (HCC)   Essential hypertension   Morbid obesity (HCC)   Weakness of left leg   Elevated LFTs   Left leg weakness   Thoracic spinal stenosis      Past Medical History  Diagnosis Date  . Diabetes mellitus without complication (HCC)   . Hypertension   . Hyperlipemia     Past Surgical History  Procedure Laterality Date  . Appendectomy  1969  . Lumbar disc surgery    . Lumbar laminectomy/decompression microdiscectomy N/A 07/06/2015    Procedure: Thoracic ten- eleven laminectomy for spinal canal decompression;  Surgeon: Coletta Memos, MD;  Location: [facility];  Service: Orthopedics;  Laterality: N/A;       History of present illness and  Hospital Course:     Kindly see H&P for history of present illness and admission details, please review complete Labs, Consult reports and Test reports for all details in brief  HPI  from the history and physical done on the day of admission 07/04/2015 HPI: Cassandra Stewart is a 65 y.o. female with a past  medical history significant for IDDM, MO, and HTN who presents with fall from leg weakness and inability to stand.  The patient was in her usual state of health until mid-February when she had somewhat abrupt onset of left leg weakness. She was hospitalized 4 days at [facility], discharged to home. She failed at home, fell repeatedly, and was admitted for 1 week with UTI, AKI, and LEFT leg weakness. During that hospitalization, she had an MRI of the lumbar spine and LEFT knee she believes, that were both normal. She never had any diagnosis for her left leg weakness, but was discharged to SNF at the [facility] for acute rehab.   She was at the [facility] for 21 days, and then transferred to the [facility] for 21 more days, and was just discharged to a new apartment here in [place] (she had been living in [place], but has moved here to be near her daughter), two days ago. While she was previously ambulatory before this started, since the last two months she has not been able to walk, and is still wheelchair bound at present. At  her home over the last two days, she has fallen during wheelchair transfers (like on to the commode) twice, and so today she came to the ER. There has been no progression of her weakness. There is no numbness. She feels ankle pain from twisting her ankle while in rehab. She has "hip pain" in the posterior outer buttock. She has noted swelling of both the joints and the whole leg on the LEFT.   In the ED, she was afebrile and hemodynamically stable. Na 140, K 4.5, Cr 1.27 (no previous), BG 180. LFTs slightly elevated, WBC 6.6K, UA showed bacteria and nitrites. Radiographs of the LEFT hip, femur, shoulder and lumbar spine were normal. Radiograph of the left knee showed possible avulsion fracture, which was also imaged on CT LEFT knee. Social work were consulted from the ER, but were unable to ascertain appropriate placement for the patient, who was unable to stand. She  was given Ceftriaxone for possible UTI and TRH were asked to evaluate for observation.  In the last two days, she has been confined to her recliner because she cannot walk. She has been unable to feed herself and has been urinating in the chair because she couldn't get up.   Hospital Course  65 year old female admittedwith chief complaints of acute on chronic left more than right leg weakness, symptoms started relatively suddenly around 6 weeks ago, according to the patient she had workup at [facility] which was unremarkable and she was sent to an SNF without much improvement, no matching records were located at [facility], she also says that she had a MRI of her back which was unremarkable.  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  X-ray of the L-spine, pelvis, left knee and left hip unremarkable. There is slight left tibial aversion noted on CT left knee but she is not tender in the left knee area, we will continue to monitor. Dr Bess Harvest discussed with orthopedics on call Dr Linna Caprice who thinks that the CT left knee findings on the tibia are chronic and likely a bone spur. He recommends PT and supportive care.  lower extremity weakness ,left> right - Neurology consult appreciated. 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 - Unlikely myositis with normal total CK - Neurosurgery consult appreciated, went for thoracic 10-11 laminectomy for spinal canal decompression on 5/11 by Dr. Mikal Plane  Diabetes mellitus - CBG has been afebrile during hospital stay , home regimen has been adjusted, will be discharged on Lantus 45 units subcutaneous daily, and NovoLog sliding scale , as her CABG but  control on this regimen over last 24 hours .   AKI on CKD stage III:  - Creatinine peaked at 1.65, significantly improved after holding lisinopril, Toradol and hydrochlorothiazide during hospital stay, creatinine back to baseline 1.17, resume home medication, check BMP in 3 days   HTN:  - Continue home amlodipine, hydrochlorothiazide and lisinopril.   Elevated LFTs:  - Likely hepatic steatosis.  - Repeat LFT as outpatient with new PCP   UTI - She is asymptomatic, but urine culture growing Escherichia coli more than 100,000 colonies, treated with Rocephin  Hyperkalemia - Given Kayexalate, D50 with IV insulin, albuterol treatment, resolved   Discharge Condition:  Stable   Follow UP  Follow-up Information  Follow up with Phyllis Ginger, MD.   Specialty:  Internal Medicine   Why:  After discharge from SNF   Contact information:   [address] [facility]) [place] [address] [phone]       Follow up with Carmela Hurt, MD.   Specialty:  Neurosurgery   Contact information:   [address] [place] [address] [phone]         Discharge Instructions  and  Discharge Medications         Discharge Instructions    Discharge instructions    Complete by:  As directed   Follow with Primary MD Phyllis Ginger, MD in 7 days   Get CBC, CMP,checked  by Primary MD next visit.    Activity: As tolerated with Full fall precautions use walker/cane & assistance as needed   Disposition SNF   Diet: Heart Healthy, carbohydrate modified  , with feeding assistance and aspiration precautions.  For Heart failure patients - Check your Weight same time everyday, if you gain over 2 pounds, or you develop in leg swelling, experience more shortness of breath or chest pain, call your Primary MD immediately. Follow Cardiac Low Salt Diet and 1.5 lit/day fluid restriction.   On your next visit with your primary  care physician please Get Medicines reviewed and adjusted.   Please request your Prim.MD to go over all Hospital Tests and Procedure/Radiological results at the follow up, please get all Hospital records sent to your Prim MD by signing hospital release before you go home.   If you experience worsening of your admission symptoms, develop shortness of breath, life threatening emergency, suicidal or homicidal thoughts you must seek medical attention immediately by calling 911 or calling your MD immediately  if symptoms less severe.  You Must read complete instructions/literature along with all the possible adverse reactions/side effects for all the Medicines you take and that have been prescribed to you. Take any new Medicines after you have completely understood and accpet all the possible adverse reactions/side effects.   Do not drive, operating heavy machinery, perform activities at heights, swimming or participation in water activities or provide baby sitting services if your were admitted for syncope or siezures until you have seen by Primary MD or a Neurologist and advised to do so again.  Do not drive when taking Pain medications.    Do not take more than prescribed Pain, Sleep and Anxiety Medications  Special Instructions: If you have smoked or chewed Tobacco  in the last 2 yrs please stop smoking, stop any regular Alcohol  and or any Recreational drug use.  Wear Seat belts while driving.   Please note  You were cared for by a hospitalist during your hospital stay. If you have any questions about your discharge medications or the care you received while you were in the hospital after you are discharged, you can call the unit and asked to speak with the hospitalist on call if the hospitalist that took care of you is not available. Once you are discharged, your primary care physician will handle any further medical issues. Please note that NO REFILLS for any discharge medications will be  authorized once you are discharged, as it is imperative that you return to your primary care physician (or establish a relationship with a primary care physician if you do not have one) for your aftercare needs so that they can reassess your need for medications and monitor your lab values.  Increase activity slowly    Complete by:  As directed             Medication List    STOP taking these medications        HUMALOG KWIKPEN 100 UNIT/ML KiwkPen  Generic drug:  insulin lispro     meloxicam 7.5 MG tablet  Commonly known as:  MOBIC     metFORMIN 500 MG tablet  Commonly known as:  GLUCOPHAGE     TOUJEO SOLOSTAR 300 UNIT/ML Sopn  Generic drug:  Insulin Glargine  Replaced by:  insulin glargine 100 UNIT/ML injection      TAKE these medications        amLODipine 10 MG tablet  Commonly known as:  NORVASC  Take 10 mg by mouth daily.     aspirin EC 81 MG tablet  Take 81 mg by mouth every evening.     gabapentin 300 MG capsule  Commonly known as:  NEURONTIN  Take 300 mg by mouth 3 (three) times daily.     hydrochlorothiazide 25 MG tablet  Commonly known as:  HYDRODIURIL  Take 25 mg by mouth daily.     insulin aspart 100 UNIT/ML injection  Commonly known as:  novoLOG  Inject 0-15 Units into the skin 3 (three) times daily with meals.     insulin glargine 100 UNIT/ML injection  Commonly known as:  LANTUS  Inject 0.45 mLs (45 Units total) into the skin daily.     lisinopril 10 MG tablet  Commonly known as:  PRINIVIL,ZESTRIL  Take 10 mg by mouth daily.     lovastatin 40 MG tablet  Commonly known as:  MEVACOR  Take 40 mg by mouth every evening.     oxyCODONE-acetaminophen 5-325 MG tablet  Commonly known as:  PERCOCET/ROXICET  Take 1 tablet by mouth every 6 (six) hours as needed for severe pain.     polyethylene glycol packet  Commonly known as:  MIRALAX / GLYCOLAX  Take 17 g by mouth daily as needed for mild constipation.     senna-docusate 8.6-50 MG tablet    Commonly known as:  Senokot-S  Take 1 tablet by mouth 2 (two) times daily.     VITAMIN D PO  Take 1 tablet by mouth daily.          Diet and Activity recommendation: See Discharge Instructions above   Consults obtained -  Neurology Neurosurgery   Major procedures and Radiology Reports - PLEASE review detailed and final reports for all details, in brief -  thoracic 10-11 laminectomy for spinal canal decompression on 5/11 by Dr. Mikal Plane  Dg Chest 2 View  07/03/2015  CLINICAL DATA:  Recent fall. EXAM: CHEST  2 VIEW COMPARISON:  None. FINDINGS: Two views of the chest were obtained. Lungs are clear without airspace disease, pulmonary edema or large pneumothorax. Haziness in the left lower chest probably related to overlying soft tissues. No large pleural effusions. No acute bone abnormalities. IMPRESSION: No active cardiopulmonary disease. Electronically Signed   By: Richarda Overlie M.D.   On: 07/03/2015 21:19   Dg Thoracolumabar Spine  07/06/2015  CLINICAL DATA:  Elective surgery.  T10-11 laminectomy. EXAM: DG C-ARM 61-120 MIN; THORACOLUMBAR SPINE - 2 VIEW COMPARISON:  Thoracic spine MRI 07/04/2015 FINDINGS: Surgical retractor overlaps the T10 and T11 vertebrae based on the lowest ribs. An orogastric tube is present. IMPRESSION: Fluoroscopy for spinal localization. Electronically Signed   By: Marnee Spring M.D.   On: 07/06/2015 19:33   Dg Lumbar  Spine Complete  07/03/2015  CLINICAL DATA:  65 year old female with fall and left leg pain. EXAM: LUMBAR SPINE - COMPLETE 4+ VIEW COMPARISON:  None. FINDINGS: There is no acute fracture or subluxation of the lumbar spine. The vertebral body heights and disc spaces are maintained. The visualized transverse and spinous processes are intact. The soft tissues are grossly unremarkable. A small amorphous calcific density in the left hemipelvis most compatible with a calcified fibroid. IMPRESSION: No acute/traumatic lumbar spine pathology. Electronically Signed    By: Elgie CollardArash  Radparvar M.D.   On: 07/03/2015 21:19   Ct Knee Left Wo Contrast  07/04/2015  CLINICAL DATA:  Larey SeatFell about a week ago. EXAM: CT OF THE left KNEE WITHOUT CONTRAST TECHNIQUE: Multidetector CT imaging of the left knee was performed according to the standard protocol. Multiplanar CT image reconstructions were also generated. COMPARISON:  Radiographs 07/03/2015 FINDINGS: There is no tibial plateau fracture. There is slight fragmentation of the lateral tibial spine which may represent a recent avulsion. There is no significant effusion. There is mild prepatellar soft tissue edema. IMPRESSION: No tibial plateau fracture. Slight fragmentation of the lateral tibial spine might represent a recent avulsion injury. Electronically Signed   By: Ellery Plunkaniel R [place] M.D.   On: 07/04/2015 01:00   Mr Thoracic Spine Wo Contrast  07/04/2015  CLINICAL DATA:  Progressive left leg pain and weakness. History of surgery L5-S1 herniated disc. EXAM: MRI THORACIC AND LUMBAR SPINE WITHOUT CONTRAST TECHNIQUE: Multiplanar and multiecho pulse sequences of the thoracic and lumbar spine were obtained without intravenous contrast. COMPARISON:  None. FINDINGS: MR THORACIC SPINE FINDINGS Image quality degraded by motion and patient size. Negative for fracture or mass lesion. Extensive multilevel spondylosis. There is disc degeneration and spurring throughout the thoracic spine. In addition, there is facet hypertrophy throughout the mid and lower thoracic spine. There is mild spinal stenosis at T5-6, T6-7, T7-8, T9-10. Moderately severe spinal stenosis at T10-11 due to vertebral spurring and facet hypertrophy. There is focal hyperintensity in the cord at this level which is ill-defined. This is compatible with myelomalacia which could be acute or chronic. Mild spinal stenosis at T11-T12. MR LUMBAR SPINE FINDINGS S1 is fully lumbarized. There are 6 lumbarized vertebral segments the lowest of which will be labeled S1. This is counting from  skullbase down. This places the severe spinal stenosis and cord hyperintensity at T10-11. Normal lumbar alignment. Negative for fracture or mass lesion. Conus medullaris normal and terminates at approximately mid L3. No evidence of tethered cord or thickening of the filum terminale. Low lying conus medullaris felt to be related to an extra lumbar vertebral segment. L1-2:  Negative L2-3:  Negative L3-4:  Negative L4-5: Mild disc and mild facet degeneration causing mild spinal stenosis L5-S1: Disc and moderate facet degeneration.  Mild spinal stenosis S1-2: Postop changes on the left with epidural scarring. Disc degeneration and spondylosis without disc protrusion. 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 This is causing moderately severe spinal stenosis at T10-11 with focal cord hyperintensity and myelomalacia this level. Cord signal abnormality could be acute or chronic. Multiple additional levels of mild spinal stenosis as above MR LUMBAR SPINE IMPRESSION Six non-rib-bearing lumbar segments, the lowest disc space is labeled S1-2. There has been prior surgery on the left at this level. Mild spinal stenosis at L4-5 and L5-S1. Electronically Signed   By: Marlan Palauharles  Clark M.D.   On: 07/04/2015 17:43   Mr Lumbar Spine Wo Contrast  07/04/2015  CLINICAL DATA:  Progressive left leg pain and weakness. History of surgery L5-S1 herniated disc. EXAM: MRI THORACIC AND LUMBAR SPINE WITHOUT CONTRAST TECHNIQUE: Multiplanar and multiecho pulse sequences of the thoracic and lumbar spine were obtained without intravenous contrast. COMPARISON:  None. FINDINGS: MR THORACIC SPINE FINDINGS Image quality degraded by motion and patient size. Negative for fracture or mass lesion. Extensive multilevel spondylosis. There is disc degeneration and spurring throughout the thoracic spine. In addition, there is facet hypertrophy throughout the mid and  lower thoracic spine. There is mild spinal stenosis at T5-6, T6-7, T7-8, T9-10. Moderately severe spinal stenosis at T10-11 due to vertebral spurring and facet hypertrophy. There is focal hyperintensity in the cord at this level which is ill-defined. This is compatible with myelomalacia which could be acute or chronic. Mild spinal stenosis at T11-T12. MR LUMBAR SPINE FINDINGS S1 is fully lumbarized. There are 6 lumbarized vertebral segments the lowest of which will be labeled S1. This is counting from skullbase down. This places the severe spinal stenosis and cord hyperintensity at T10-11. Normal lumbar alignment. Negative for fracture or mass lesion. Conus medullaris normal and terminates at approximately mid L3. No evidence of tethered cord or thickening of the filum terminale. Low lying conus medullaris felt to be related to an extra lumbar vertebral segment. L1-2:  Negative L2-3:  Negative L3-4:  Negative L4-5: Mild disc and mild facet degeneration causing mild spinal stenosis L5-S1: Disc and moderate facet degeneration.  Mild spinal stenosis S1-2: Postop changes on the left with epidural scarring. Disc degeneration and spondylosis without disc protrusion. 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 This is causing moderately severe spinal stenosis at T10-11 with focal cord hyperintensity and myelomalacia this level. Cord signal abnormality could be acute or chronic. Multiple additional levels of mild spinal stenosis as above MR LUMBAR SPINE IMPRESSION Six non-rib-bearing lumbar segments, the lowest disc space is labeled S1-2. There has been prior surgery on the left at this level. Mild spinal stenosis at L4-5 and L5-S1. Electronically Signed   By: Marlan Palau M.D.   On: 07/04/2015 17:43   Mr Sacrum/si Joints Wo Contrast  07/04/2015  CLINICAL DATA:  Left leg pain and weakness, progressive.  Diabetes. EXAM: MR SACRUM  WITHOUT CONTRAST TECHNIQUE: Multiplanar, multisequence MR imaging was performed. No intravenous contrast was administered. COMPARISON:  07/03/2015 FINDINGS: There is edema tracking in the gluteus medius and minimus muscles, and to a lesser extent in the gluteus maximus, piriformis, and hip adductor musculature. There is also presacral edema. Given the generalized nature, I favor third spacing of fluid over myositis. A creating kinase level could be utilized to assess for the less likely possibility of myositis. Trace left hip joint effusion. Upper normal amount of fluid in both sacroiliac joints but no overt joint effusion. The appearance is relatively bilaterally symmetric. There is no significant adjacent marrow edema or sclerosis. No erosion along the sacroiliac joints identified. No impingement to the sacral neural foramina. Proximal hamstring tendons appear intact. No discontinuity along the sacrum or coccyx, or compelling findings of sacral insufficiency fracture. IMPRESSION: 1. There is an appearance suggesting third spacing of fluid with presacral edema and edema in the regional musculature in a generalized fashion. Diffuse myositis is a less likely differential diagnostic consideration, correlate with CK level. 2. Trace left hip joint effusion. No overt sacroiliac joint effusion, SI joint erosion, or other findings of sacroiliitis at this time. No sciatic notch impingement.  Electronically Signed   By: Gaylyn Rong M.D.   On: 07/04/2015 19:06   Dg Chest Port 1 View  07/06/2015  CLINICAL DATA:  Patient with central line placement. EXAM: PORTABLE CHEST 1 VIEW COMPARISON:  Chest radiograph 07/03/2015 FINDINGS: Right IJ central venous catheter tip projects over the superior vena cava. Multiple monitoring leads overlie the patient. Stable enlarged cardiac and mediastinal contours. No consolidative pulmonary opacities. No pleural effusion or pneumothorax. AC joint degenerative changes. IMPRESSION: Right  IJ central venous catheter tip projects over the superior cavoatrial junction. Electronically Signed   By: Annia Belt M.D.   On: 07/06/2015 19:37   Dg Shoulder Left  07/03/2015  CLINICAL DATA:  Fall yesterday and today. Left shoulder pain and decreased range of motion. Initial encounter. EXAM: LEFT SHOULDER - 2+ VIEW COMPARISON:  None. FINDINGS: There is no evidence of fracture or dislocation. Mild acromioclavicular degenerative spurring noted. No other bone lesions identified. Soft tissues are unremarkable. IMPRESSION: No acute findings.  Mild acromioclavicular DJD. Electronically Signed   By: Myles Rosenthal M.D.   On: 07/03/2015 21:19   Dg Knee Complete 4 Views Left  07/03/2015  CLINICAL DATA:  Recurrent falls during past 2 days. Left knee pain and limited mobility. Initial encounter. EXAM: LEFT KNEE - COMPLETE 4+ VIEW COMPARISON:  None. FINDINGS: Diffuse soft tissue swelling noted. A tiny avulsion fracture fragment is seen from the lateral tibial spine. No other fractures are identified. Alignment is normal. No evidence of knee joint effusion. IMPRESSION: Diffuse soft tissue swelling. Tiny avulsion fracture fragment from the lateral tibial spine. Electronically Signed   By: Myles Rosenthal M.D.   On: 07/03/2015 21:21   Dg C-arm 1-60 Min  07/06/2015  CLINICAL DATA:  Elective surgery.  T10-11 laminectomy. EXAM: DG C-ARM 61-120 MIN; THORACOLUMBAR SPINE - 2 VIEW COMPARISON:  Thoracic spine MRI 07/04/2015 FINDINGS: Surgical retractor overlaps the T10 and T11 vertebrae based on the lowest ribs. An orogastric tube is present. IMPRESSION: Fluoroscopy for spinal localization. Electronically Signed   By: Marnee Spring M.D.   On: 07/06/2015 19:33   Dg Hip Unilat With Pelvis 2-3 Views Left  07/03/2015  CLINICAL DATA:  65 year old female with fall and left leg pain. EXAM: DG HIP (WITH OR WITHOUT PELVIS) 2-3V LEFT COMPARISON:  None. FINDINGS: There is no acute fracture or dislocation. No significant degenerative changes.  A 1.8 x 2.4 cm amorphous calcific density in the left hemipelvis most likely represents a fibroid. The soft tissues appear unremarkable. IMPRESSION: No fracture or dislocation. Electronically Signed   By: Elgie Collard M.D.   On: 07/03/2015 21:17   Dg Femur Min 2 Views Left  07/03/2015  CLINICAL DATA: Fall On Sunday and Monday.  Left leg pain. EXAM: LEFT FEMUR 2 VIEWS COMPARISON:  None. FINDINGS: No acute bony abnormality. Specifically, no fracture, subluxation, or dislocation. Soft tissues are intact. Calcification in the left side of the pelvis, likely fibroid. Vascular calcifications noted within the left thigh. IMPRESSION: No acute bony abnormality. Electronically Signed   By: Charlett Nose M.D.   On: 07/03/2015 21:18    Micro Results   Recent Results (from the past 240 hour(s))  Urine culture     Status: Abnormal   Collection Time: 07/03/15  7:41 PM  Result Value Ref Range Status   Specimen Description URINE, CLEAN CATCH  Final   Special Requests NONE  Final   Culture >=100,000 COLONIES/mL ESCHERICHIA COLI (A)  Final   Report Status 07/06/2015 FINAL  Final  Organism ID, Bacteria ESCHERICHIA COLI (A)  Final      Susceptibility   Escherichia coli - MIC*    AMPICILLIN 4 SENSITIVE Sensitive     CEFAZOLIN <=4 SENSITIVE Sensitive     CEFTRIAXONE <=1 SENSITIVE Sensitive     CIPROFLOXACIN <=0.25 SENSITIVE Sensitive     GENTAMICIN <=1 SENSITIVE Sensitive     IMIPENEM <=0.25 SENSITIVE Sensitive     NITROFURANTOIN <=16 SENSITIVE Sensitive     TRIMETH/SULFA <=20 SENSITIVE Sensitive     AMPICILLIN/SULBACTAM 4 SENSITIVE Sensitive     PIP/TAZO <=4 SENSITIVE Sensitive     * >=100,000 COLONIES/mL ESCHERICHIA COLI  Surgical pcr screen     Status: None   Collection Time: 07/06/15  5:10 AM  Result Value Ref Range Status   MRSA, PCR NEGATIVE NEGATIVE Final   Staphylococcus aureus NEGATIVE NEGATIVE Final    Comment:        The Xpert SA Assay (FDA approved for NASAL specimens in patients over  1 years of age), is one component of a comprehensive surveillance program.  Test performance has been validated by [facility] for patients greater than or equal to 92 year old. It is not intended to diagnose infection nor to guide or monitor treatment.        Today   Subjective:   Denaya Horn today has no headache,no chest or abdominal pain, reports left lower extremity weakness continues to improve since surgery.  Objective:   Blood pressure 155/43, pulse 77, temperature 98 F (36.7 C), temperature source Oral, resp. rate 17, height 5' 4.5" (1.638 m), weight 126 kg (277 lb 12.5 oz), SpO2 97 %.   Intake/Output Summary (Last 24 hours) at 07/10/15 1017 Last data filed at 07/10/15 0600  Gross per 24 hour  Intake    870 ml  Output    400 ml  Net    470 ml    Exam Awake Alert, Oriented X 3,  [place].AT,PERRAL Supple Neck,No JVD,  Symmetrical Chest wall movement, Good air movement bilaterally, CTAB RRR,No Gallops,Rubs or new Murmurs, No Parasternal Heave +ve B.Sounds, Abd Soft, No tenderness, No rebound - guarding or rigidity. No Cyanosis, Clubbing or edema,lower Ext weakness L>R, able to flex her left knee minimally today, which is new for her. - Lower thoracic spine surgical scar covered by mesh.  Data Review   CBC w Diff:  Lab Results  Component Value Date   WBC 9.6 07/08/2015   HGB 10.9* 07/08/2015   HCT 33.8* 07/08/2015   PLT 250 07/08/2015   LYMPHOPCT 44 07/03/2015   MONOPCT 9 07/03/2015   EOSPCT 4 07/03/2015   BASOPCT 1 07/03/2015    CMP:  Lab Results  Component Value Date   NA 143 07/09/2015   K 3.6 07/09/2015   CL 106 07/09/2015   CO2 26 07/09/2015   BUN 23* 07/09/2015   CREATININE 1.17* 07/09/2015   PROT 6.6 07/04/2015   ALBUMIN 3.4* 07/04/2015   BILITOT 0.6 07/04/2015   ALKPHOS 62 07/04/2015   AST 59* 07/04/2015   ALT 53 07/04/2015  .   Total Time in preparing paper work, data evaluation and todays exam - 35  minutes  Hamsa Laurich M.D on 07/10/2015 at 10:17 AM  [facility]   Office  [phone]

## 2015-07-27 DIAGNOSIS — G629 Polyneuropathy, unspecified: Secondary | ICD-10-CM | POA: Diagnosis not present

## 2015-07-27 DIAGNOSIS — J45998 Other asthma: Secondary | ICD-10-CM | POA: Diagnosis not present

## 2015-07-27 DIAGNOSIS — N390 Urinary tract infection, site not specified: Secondary | ICD-10-CM | POA: Diagnosis not present

## 2015-07-27 DIAGNOSIS — E785 Hyperlipidemia, unspecified: Secondary | ICD-10-CM | POA: Diagnosis not present

## 2015-07-27 DIAGNOSIS — G6289 Other specified polyneuropathies: Secondary | ICD-10-CM | POA: Diagnosis not present

## 2015-07-27 DIAGNOSIS — L98429 Non-pressure chronic ulcer of back with unspecified severity: Secondary | ICD-10-CM | POA: Diagnosis not present

## 2015-07-27 DIAGNOSIS — R296 Repeated falls: Secondary | ICD-10-CM | POA: Diagnosis not present

## 2015-07-27 DIAGNOSIS — I10 Essential (primary) hypertension: Secondary | ICD-10-CM | POA: Diagnosis not present

## 2015-07-27 DIAGNOSIS — M961 Postlaminectomy syndrome, not elsewhere classified: Secondary | ICD-10-CM | POA: Diagnosis not present

## 2015-07-27 DIAGNOSIS — S37099D Other injury of unspecified kidney, subsequent encounter: Secondary | ICD-10-CM | POA: Diagnosis not present

## 2015-07-27 DIAGNOSIS — R262 Difficulty in walking, not elsewhere classified: Secondary | ICD-10-CM | POA: Diagnosis not present

## 2015-07-27 DIAGNOSIS — E11610 Type 2 diabetes mellitus with diabetic neuropathic arthropathy: Secondary | ICD-10-CM | POA: Diagnosis not present

## 2015-07-27 DIAGNOSIS — R5381 Other malaise: Secondary | ICD-10-CM | POA: Diagnosis not present

## 2015-07-27 DIAGNOSIS — G8929 Other chronic pain: Secondary | ICD-10-CM | POA: Diagnosis not present

## 2015-07-27 DIAGNOSIS — M6281 Muscle weakness (generalized): Secondary | ICD-10-CM | POA: Diagnosis not present

## 2015-08-01 DIAGNOSIS — L98429 Non-pressure chronic ulcer of back with unspecified severity: Secondary | ICD-10-CM | POA: Diagnosis not present

## 2015-08-03 DIAGNOSIS — R5381 Other malaise: Secondary | ICD-10-CM | POA: Diagnosis not present

## 2015-08-03 DIAGNOSIS — G8929 Other chronic pain: Secondary | ICD-10-CM | POA: Diagnosis not present

## 2015-08-03 DIAGNOSIS — R262 Difficulty in walking, not elsewhere classified: Secondary | ICD-10-CM | POA: Diagnosis not present

## 2015-08-03 DIAGNOSIS — M6281 Muscle weakness (generalized): Secondary | ICD-10-CM | POA: Diagnosis not present

## 2015-08-09 DIAGNOSIS — M961 Postlaminectomy syndrome, not elsewhere classified: Secondary | ICD-10-CM | POA: Diagnosis not present

## 2015-08-09 DIAGNOSIS — I10 Essential (primary) hypertension: Secondary | ICD-10-CM | POA: Diagnosis not present

## 2015-08-09 DIAGNOSIS — R262 Difficulty in walking, not elsewhere classified: Secondary | ICD-10-CM | POA: Diagnosis not present

## 2015-08-09 DIAGNOSIS — R296 Repeated falls: Secondary | ICD-10-CM | POA: Diagnosis not present

## 2015-08-09 DIAGNOSIS — E11610 Type 2 diabetes mellitus with diabetic neuropathic arthropathy: Secondary | ICD-10-CM | POA: Diagnosis not present

## 2015-08-11 DIAGNOSIS — E785 Hyperlipidemia, unspecified: Secondary | ICD-10-CM | POA: Diagnosis not present

## 2015-08-11 DIAGNOSIS — S82122D Displaced fracture of lateral condyle of left tibia, subsequent encounter for closed fracture with routine healing: Secondary | ICD-10-CM | POA: Diagnosis not present

## 2015-08-11 DIAGNOSIS — M961 Postlaminectomy syndrome, not elsewhere classified: Secondary | ICD-10-CM | POA: Diagnosis not present

## 2015-08-11 DIAGNOSIS — I129 Hypertensive chronic kidney disease with stage 1 through stage 4 chronic kidney disease, or unspecified chronic kidney disease: Secondary | ICD-10-CM | POA: Diagnosis not present

## 2015-08-11 DIAGNOSIS — E11610 Type 2 diabetes mellitus with diabetic neuropathic arthropathy: Secondary | ICD-10-CM | POA: Diagnosis not present

## 2015-08-11 DIAGNOSIS — J45998 Other asthma: Secondary | ICD-10-CM | POA: Diagnosis not present

## 2015-08-11 DIAGNOSIS — E1122 Type 2 diabetes mellitus with diabetic chronic kidney disease: Secondary | ICD-10-CM | POA: Diagnosis not present

## 2015-08-11 DIAGNOSIS — N183 Chronic kidney disease, stage 3 (moderate): Secondary | ICD-10-CM | POA: Diagnosis not present

## 2015-08-11 DIAGNOSIS — E1142 Type 2 diabetes mellitus with diabetic polyneuropathy: Secondary | ICD-10-CM | POA: Diagnosis not present

## 2015-08-11 DIAGNOSIS — M4804 Spinal stenosis, thoracic region: Secondary | ICD-10-CM | POA: Diagnosis not present

## 2015-08-11 DIAGNOSIS — G8929 Other chronic pain: Secondary | ICD-10-CM | POA: Diagnosis not present

## 2015-08-15 DIAGNOSIS — M4804 Spinal stenosis, thoracic region: Secondary | ICD-10-CM | POA: Diagnosis not present

## 2015-08-15 DIAGNOSIS — G8929 Other chronic pain: Secondary | ICD-10-CM | POA: Diagnosis not present

## 2015-08-15 DIAGNOSIS — N183 Chronic kidney disease, stage 3 (moderate): Secondary | ICD-10-CM | POA: Diagnosis not present

## 2015-08-15 DIAGNOSIS — M961 Postlaminectomy syndrome, not elsewhere classified: Secondary | ICD-10-CM | POA: Diagnosis not present

## 2015-08-15 DIAGNOSIS — E1122 Type 2 diabetes mellitus with diabetic chronic kidney disease: Secondary | ICD-10-CM | POA: Diagnosis not present

## 2015-08-15 DIAGNOSIS — S82122D Displaced fracture of lateral condyle of left tibia, subsequent encounter for closed fracture with routine healing: Secondary | ICD-10-CM | POA: Diagnosis not present

## 2015-08-15 DIAGNOSIS — E11610 Type 2 diabetes mellitus with diabetic neuropathic arthropathy: Secondary | ICD-10-CM | POA: Diagnosis not present

## 2015-08-15 DIAGNOSIS — E785 Hyperlipidemia, unspecified: Secondary | ICD-10-CM | POA: Diagnosis not present

## 2015-08-15 DIAGNOSIS — I129 Hypertensive chronic kidney disease with stage 1 through stage 4 chronic kidney disease, or unspecified chronic kidney disease: Secondary | ICD-10-CM | POA: Diagnosis not present

## 2015-08-15 DIAGNOSIS — J45998 Other asthma: Secondary | ICD-10-CM | POA: Diagnosis not present

## 2015-08-15 DIAGNOSIS — E1142 Type 2 diabetes mellitus with diabetic polyneuropathy: Secondary | ICD-10-CM | POA: Diagnosis not present

## 2015-08-17 DIAGNOSIS — E785 Hyperlipidemia, unspecified: Secondary | ICD-10-CM | POA: Diagnosis not present

## 2015-08-17 DIAGNOSIS — E1122 Type 2 diabetes mellitus with diabetic chronic kidney disease: Secondary | ICD-10-CM | POA: Diagnosis not present

## 2015-08-17 DIAGNOSIS — S82122D Displaced fracture of lateral condyle of left tibia, subsequent encounter for closed fracture with routine healing: Secondary | ICD-10-CM | POA: Diagnosis not present

## 2015-08-17 DIAGNOSIS — N183 Chronic kidney disease, stage 3 (moderate): Secondary | ICD-10-CM | POA: Diagnosis not present

## 2015-08-17 DIAGNOSIS — J45998 Other asthma: Secondary | ICD-10-CM | POA: Diagnosis not present

## 2015-08-17 DIAGNOSIS — M4804 Spinal stenosis, thoracic region: Secondary | ICD-10-CM | POA: Diagnosis not present

## 2015-08-17 DIAGNOSIS — E1142 Type 2 diabetes mellitus with diabetic polyneuropathy: Secondary | ICD-10-CM | POA: Diagnosis not present

## 2015-08-17 DIAGNOSIS — M961 Postlaminectomy syndrome, not elsewhere classified: Secondary | ICD-10-CM | POA: Diagnosis not present

## 2015-08-17 DIAGNOSIS — E11610 Type 2 diabetes mellitus with diabetic neuropathic arthropathy: Secondary | ICD-10-CM | POA: Diagnosis not present

## 2015-08-17 DIAGNOSIS — I129 Hypertensive chronic kidney disease with stage 1 through stage 4 chronic kidney disease, or unspecified chronic kidney disease: Secondary | ICD-10-CM | POA: Diagnosis not present

## 2015-08-17 DIAGNOSIS — G8929 Other chronic pain: Secondary | ICD-10-CM | POA: Diagnosis not present

## 2015-08-18 DIAGNOSIS — M4804 Spinal stenosis, thoracic region: Secondary | ICD-10-CM | POA: Diagnosis not present

## 2015-08-18 DIAGNOSIS — E11610 Type 2 diabetes mellitus with diabetic neuropathic arthropathy: Secondary | ICD-10-CM | POA: Diagnosis not present

## 2015-08-18 DIAGNOSIS — G8929 Other chronic pain: Secondary | ICD-10-CM | POA: Diagnosis not present

## 2015-08-18 DIAGNOSIS — E1142 Type 2 diabetes mellitus with diabetic polyneuropathy: Secondary | ICD-10-CM | POA: Diagnosis not present

## 2015-08-18 DIAGNOSIS — E1122 Type 2 diabetes mellitus with diabetic chronic kidney disease: Secondary | ICD-10-CM | POA: Diagnosis not present

## 2015-08-18 DIAGNOSIS — E785 Hyperlipidemia, unspecified: Secondary | ICD-10-CM | POA: Diagnosis not present

## 2015-08-18 DIAGNOSIS — M961 Postlaminectomy syndrome, not elsewhere classified: Secondary | ICD-10-CM | POA: Diagnosis not present

## 2015-08-18 DIAGNOSIS — I129 Hypertensive chronic kidney disease with stage 1 through stage 4 chronic kidney disease, or unspecified chronic kidney disease: Secondary | ICD-10-CM | POA: Diagnosis not present

## 2015-08-18 DIAGNOSIS — S82122D Displaced fracture of lateral condyle of left tibia, subsequent encounter for closed fracture with routine healing: Secondary | ICD-10-CM | POA: Diagnosis not present

## 2015-08-18 DIAGNOSIS — N183 Chronic kidney disease, stage 3 (moderate): Secondary | ICD-10-CM | POA: Diagnosis not present

## 2015-08-18 DIAGNOSIS — J45998 Other asthma: Secondary | ICD-10-CM | POA: Diagnosis not present

## 2015-08-21 DIAGNOSIS — G8929 Other chronic pain: Secondary | ICD-10-CM | POA: Diagnosis not present

## 2015-08-21 DIAGNOSIS — N183 Chronic kidney disease, stage 3 (moderate): Secondary | ICD-10-CM | POA: Diagnosis not present

## 2015-08-21 DIAGNOSIS — M4804 Spinal stenosis, thoracic region: Secondary | ICD-10-CM | POA: Diagnosis not present

## 2015-08-21 DIAGNOSIS — M961 Postlaminectomy syndrome, not elsewhere classified: Secondary | ICD-10-CM | POA: Diagnosis not present

## 2015-08-21 DIAGNOSIS — J45998 Other asthma: Secondary | ICD-10-CM | POA: Diagnosis not present

## 2015-08-21 DIAGNOSIS — E1122 Type 2 diabetes mellitus with diabetic chronic kidney disease: Secondary | ICD-10-CM | POA: Diagnosis not present

## 2015-08-21 DIAGNOSIS — E785 Hyperlipidemia, unspecified: Secondary | ICD-10-CM | POA: Diagnosis not present

## 2015-08-21 DIAGNOSIS — E11610 Type 2 diabetes mellitus with diabetic neuropathic arthropathy: Secondary | ICD-10-CM | POA: Diagnosis not present

## 2015-08-21 DIAGNOSIS — E1142 Type 2 diabetes mellitus with diabetic polyneuropathy: Secondary | ICD-10-CM | POA: Diagnosis not present

## 2015-08-21 DIAGNOSIS — I129 Hypertensive chronic kidney disease with stage 1 through stage 4 chronic kidney disease, or unspecified chronic kidney disease: Secondary | ICD-10-CM | POA: Diagnosis not present

## 2015-08-21 DIAGNOSIS — S82122D Displaced fracture of lateral condyle of left tibia, subsequent encounter for closed fracture with routine healing: Secondary | ICD-10-CM | POA: Diagnosis not present

## 2015-08-22 DIAGNOSIS — E785 Hyperlipidemia, unspecified: Secondary | ICD-10-CM | POA: Diagnosis not present

## 2015-08-22 DIAGNOSIS — E1122 Type 2 diabetes mellitus with diabetic chronic kidney disease: Secondary | ICD-10-CM | POA: Diagnosis not present

## 2015-08-22 DIAGNOSIS — E1142 Type 2 diabetes mellitus with diabetic polyneuropathy: Secondary | ICD-10-CM | POA: Diagnosis not present

## 2015-08-22 DIAGNOSIS — N183 Chronic kidney disease, stage 3 (moderate): Secondary | ICD-10-CM | POA: Diagnosis not present

## 2015-08-22 DIAGNOSIS — J45998 Other asthma: Secondary | ICD-10-CM | POA: Diagnosis not present

## 2015-08-22 DIAGNOSIS — G8929 Other chronic pain: Secondary | ICD-10-CM | POA: Diagnosis not present

## 2015-08-22 DIAGNOSIS — M961 Postlaminectomy syndrome, not elsewhere classified: Secondary | ICD-10-CM | POA: Diagnosis not present

## 2015-08-22 DIAGNOSIS — E11610 Type 2 diabetes mellitus with diabetic neuropathic arthropathy: Secondary | ICD-10-CM | POA: Diagnosis not present

## 2015-08-22 DIAGNOSIS — I129 Hypertensive chronic kidney disease with stage 1 through stage 4 chronic kidney disease, or unspecified chronic kidney disease: Secondary | ICD-10-CM | POA: Diagnosis not present

## 2015-08-22 DIAGNOSIS — S82122D Displaced fracture of lateral condyle of left tibia, subsequent encounter for closed fracture with routine healing: Secondary | ICD-10-CM | POA: Diagnosis not present

## 2015-08-22 DIAGNOSIS — M4804 Spinal stenosis, thoracic region: Secondary | ICD-10-CM | POA: Diagnosis not present

## 2015-08-23 ENCOUNTER — Telehealth: Payer: Self-pay | Admitting: Internal Medicine

## 2015-08-23 ENCOUNTER — Encounter: Payer: Self-pay | Admitting: Family Medicine

## 2015-08-23 ENCOUNTER — Ambulatory Visit (INDEPENDENT_AMBULATORY_CARE_PROVIDER_SITE_OTHER): Payer: Medicare Other | Admitting: Family Medicine

## 2015-08-23 VITALS — BP 132/80 | HR 64

## 2015-08-23 DIAGNOSIS — E1142 Type 2 diabetes mellitus with diabetic polyneuropathy: Secondary | ICD-10-CM | POA: Diagnosis not present

## 2015-08-23 DIAGNOSIS — R29898 Other symptoms and signs involving the musculoskeletal system: Secondary | ICD-10-CM

## 2015-08-23 DIAGNOSIS — I129 Hypertensive chronic kidney disease with stage 1 through stage 4 chronic kidney disease, or unspecified chronic kidney disease: Secondary | ICD-10-CM | POA: Diagnosis not present

## 2015-08-23 DIAGNOSIS — S82122D Displaced fracture of lateral condyle of left tibia, subsequent encounter for closed fracture with routine healing: Secondary | ICD-10-CM | POA: Diagnosis not present

## 2015-08-23 DIAGNOSIS — R262 Difficulty in walking, not elsewhere classified: Secondary | ICD-10-CM | POA: Diagnosis not present

## 2015-08-23 DIAGNOSIS — J45998 Other asthma: Secondary | ICD-10-CM | POA: Diagnosis not present

## 2015-08-23 DIAGNOSIS — E11610 Type 2 diabetes mellitus with diabetic neuropathic arthropathy: Secondary | ICD-10-CM | POA: Diagnosis not present

## 2015-08-23 DIAGNOSIS — M961 Postlaminectomy syndrome, not elsewhere classified: Secondary | ICD-10-CM | POA: Diagnosis not present

## 2015-08-23 DIAGNOSIS — N183 Chronic kidney disease, stage 3 (moderate): Secondary | ICD-10-CM | POA: Diagnosis not present

## 2015-08-23 DIAGNOSIS — E785 Hyperlipidemia, unspecified: Secondary | ICD-10-CM | POA: Diagnosis not present

## 2015-08-23 DIAGNOSIS — I10 Essential (primary) hypertension: Secondary | ICD-10-CM

## 2015-08-23 DIAGNOSIS — M4804 Spinal stenosis, thoracic region: Secondary | ICD-10-CM | POA: Diagnosis not present

## 2015-08-23 DIAGNOSIS — E1122 Type 2 diabetes mellitus with diabetic chronic kidney disease: Secondary | ICD-10-CM | POA: Diagnosis not present

## 2015-08-23 DIAGNOSIS — G8929 Other chronic pain: Secondary | ICD-10-CM | POA: Diagnosis not present

## 2015-08-23 NOTE — Telephone Encounter (Signed)
Per vickie, as she talked to Dr. Susann GivensLalonde about wants a hospital bed and mobilized wheelchair. Pt should talk to her neurosurgeon that did the back surgery about getting those approved as her back doctor knows what she has been through and what he recommends and since PT is helping out, they may need additonal info that we do not have. Pt is aware and will talk to neurosurgeon on Monday when she goes

## 2015-08-23 NOTE — Patient Instructions (Addendum)
I will look over your medical records from rehab and your previous primary care in [place] once I receive them.  Continue checking daily fasting blood sugar and check at least 2 blood sugars 2 hours after lunch or supper. We need to know what your blood sugars are doing throughout the day to make sure you were on the correct dose of insulin. Watch your carbohydrate intake.   Your incision has no signs of infection today. Have the home health nurse keep an eye on this and have your surgeon take a look Monday.  If you develop fever, chills, vomiting or severe pain then give us a call or return.    Basic Carbohydrate Counting for Diabetes Mellitus Carbohydrate counting is a method for keeping track of the amount of carbohydrates you eat. Eating carbohydrates naturally increases the level of sugar (glucose) in your blood, so it is important for you to know the amount that is okay for you to have in every meal. Carbohydrate counting helps keep the level of glucose in your blood within normal limits. The amount of carbohydrates allowed is different for every person. A dietitian can help you calculate the amount that is right for you. Once you know the amount of carbohydrates you can have, you can count the carbohydrates in the foods you want to eat. Carbohydrates are found in the following foods:  Grains, such as breads and cereals.  Dried beans and soy products.  Starchy vegetables, such as potatoes, peas, and corn.  Fruit and fruit juices.  Milk and yogurt.  Sweets and snack foods, such as cake, cookies, candy, chips, soft drinks, and fruit drinks. CARBOHYDRATE COUNTING There are two ways to count the carbohydrates in your food. You can use either of the methods or a combination of both. Reading the "Nutrition Facts" on Packaged Food The "Nutrition Facts" is an area that is included on the labels of almost all packaged food and beverages in the [place]. It includes the serving size of that  food or beverage and information about the nutrients in each serving of the food, including the grams (g) of carbohydrate per serving.  Decide the number of servings of this food or beverage that you will be able to eat or drink. Multiply that number of servings by the number of grams of carbohydrate that is listed on the label for that serving. The total will be the amount of carbohydrates you will be having when you eat or drink this food or beverage. Learning Standard Serving Sizes of Food When you eat food that is not packaged or does not include "Nutrition Facts" on the label, you need to measure the servings in order to count the amount of carbohydrates.A serving of most carbohydrate-rich foods contains about 15 g of carbohydrates. The following list includes serving sizes of carbohydrate-rich foods that provide 15 g ofcarbohydrate per serving:   1 slice of bread (1 oz) or 1 six-inch tortilla.    of a hamburger bun or English muffin.  4-6 crackers.   cup unsweetened dry cereal.    cup hot cereal.   cup rice or pasta.    cup mashed potatoes or  of a large baked potato.  1 cup fresh fruit or one small piece of fruit.    cup canned or frozen fruit or fruit juice.  1 cup milk.   cup plain fat-free yogurt or yogurt sweetened with artificial sweeteners.   cup cooked dried beans or starchy vegetable, such as peas, corn,  or potatoes.  Decide the number of standard-size servings that you will eat. Multiply that number of servings by 15 (the grams of carbohydrates in that serving). For example, if you eat 2 cups of strawberries, you will have eaten 2 servings and 30 g of carbohydrates (2 servings x 15 g = 30 g). For foods such as soups and casseroles, in which more than one food is mixed in, you will need to count the carbohydrates in each food that is included. EXAMPLE OF CARBOHYDRATE COUNTING Sample Dinner  3 oz chicken breast.   cup of brown rice.   cup of  corn.  1 cup milk.   1 cup strawberries with sugar-free whipped topping.  Carbohydrate Calculation Step 1: Identify the foods that contain carbohydrates:   Rice.   Corn.   Milk.   Strawberries. Step 2:Calculate the number of servings eaten of each:   2 servings of rice.   1 serving of corn.   1 serving of milk.   1 serving of strawberries. Step 3: Multiply each of those number of servings by 15 g:   2 servings of rice x 15 g = 30 g.   1 serving of corn x 15 g = 15 g.   1 serving of milk x 15 g = 15 g.   1 serving of strawberries x 15 g = 15 g. Step 4: Add together all of the amounts to find the total grams of carbohydrates eaten: 30 g + 15 g + 15 g + 15 g = 75 g.   This information is not intended to replace advice given to you by your health care provider. Make sure you discuss any questions you have with your health care provider.   Document Released: 02/11/2005 Document Revised: 03/04/2014 Document Reviewed: 01/08/2013 Elsevier Interactive Patient Education [employer]2016 Elsevier Inc.

## 2015-08-23 NOTE — Progress Notes (Signed)
Subjective:    Patient ID: Cassandra StalkerBarbara Serrao, female    DOB: 30-Jul-1950, 65 y.o.   MRN: [id number]  HPI Chief Complaint  Patient presents with  . new pt    new pt- back surgery- was in rehab- had weakness in leg. bed sores on back   She is new to the practice and here to establish care. Moved from [place] in April. She states she lives alone. Daughter comes by to help her. She is having PT, OT, Home health nurse is starting this Friday. States her insurance company sent her transportation for her to come today.  States in February the muscle in her left leg became "weak" and she couldn't walk and starting falling. 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 She was [facility] and [facility], no surgery. She states they did not find out what was wrong. They sent [facility] to PT and [facility]. She complains she sustained injuries to her left ankle and back due to falls while at [facility].  She started going to [facility] in April and she was there for 3 weeks.  States everytime she was sent home she had multiple falls. Went to ED and had MRI total spine and evaluated by Neurologist.  She then went back [facility] another 3 weeks. In mid May she was discharged home and has been there since getting in home therapy.  [facility] is home health. Jorja Loaim is Merchandiser, retailsupervisor.   States she is getting more muscle movement in the left leg. She can transfer herself from wheelchair to bed. Is not using toilet, states she is using diapers.  The long term goal is for her to walk again.    She is requesting a hospital bed in her home due to it being difficult for her to get in and out of her current bed.  She would also like a motorized wheel chair. She states she has difficulty getting around her house and does not leave her house. Thinks a motorized chair will help her.   States she thinks her incision is opening up a little on her back.  States she cannot get in and out of bed without  assistance.  Thinks she is getting bed sores on her back.   Other providers: Dr. Franky Machoabbell is her neurosurgeon and she has appointment with him on July 3rd. Other providers are in [place]. She was going to [facility] in [place].   Past medical history: Diabetes type 2 approximately 24 years ago. Started requiring insulin approximately 12 years.   Blood sugars at home: daily fasting 156.  Medication- Toujeo, humulin.  States she loves carbs and vegetables.   She wants 90 day supply of medications.   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 Was not working prior to left leg weakness.  Denies Smoking, drinking alcohol, drug use   Reviewed allergies, medications, past medical, surgical,  and social history.   Review of Systems Review of Systems Constitutional: -fever, -chills, -unexpected weight change ENT: -runny nose, -ear pain, -sore throat Cardiology:  -chest pain, -palpitations Respiratory: -cough, -shortness of breath, -wheezing Gastroenterology: -abdominal pain, -nausea, -vomiting, -diarrhea, -constipation  Hematology: -bleeding or bruising problems Urology: -dysuria,  -hematuria, -urinary frequency, -urgency Neurology: -headache, +weakness LLE, -tingling, -numbness       Objective:   Physical Exam  Constitutional: She is oriented to person, place, and time. She appears well-developed and well-nourished. No distress.  Cardiovascular: Normal rate and regular rhythm.   Pulmonary/Chest: Effort normal and breath sounds normal.  Musculoskeletal:  Weakness to LLE compared to right. Sensation, capillary refill, pulses normal and equal.   Neurological: She is alert and oriented to person, place, and time.  Skin: Skin is warm and dry.     4 inch incision with 1-2 mm of edges minimally separated. Tissue pink with good granulation. No drainage, erythema, or signs of infection.  No evidence of pressure ulcers to buttocks or  back.    BP 132/80 mmHg  Pulse 64      Assessment & Plan:  Essential hypertension  Weakness of left leg  Morbid obesity, unspecified obesity type (HCC)  Inability to walk  Discussed patient with Dr. Susann GivensLalonde. Recommend that she speak with her surgeon at her appointment on July 3rd regarding her desire to have a hospital bed and motorized wheelchair. Discussed that since this is my first meeting with her and Dr. Franky Machoabbell has been caring for her for several weeks, he would better know her needs and what she would qualify for in regards to home health and assistance. Home health was ordered by her surgeon.  Plan to get records from her PCP in [place] and from her most recent rehab stay.  Discussed that she does not currently have pressure ulcers but recommend that she pay close attention to how often she is changing positions and try to take the pressure off of her back and buttocks in order to prevent skin breakdown. Also recommend staying clean and dry.  Encouraged her to continue working on strength and mobility in order to try to transfer to the toilet so that she may avoid wearing adult briefs.  Discussed that her incision has opened slightly but this is superficial and no signs of infection at the present. Will clean the area, apply bacitracin and cover with gauze until the home health nurse sees her on Friday. She will also discuss this with her surgeon next week.  Recommend that she continue watching her carbohydrate intake. Check her blood sugar every morning fasting and 2 hours after meals at least 2-3 times per week in order to correctly dose her insulin.  Her blood pressure is within goal. No changes to medications.  She will follow up once I get medical records or sooner if needed. Discussed signs of infection and when to call/return.  Spent a minimum of 45 minutes with patient face to face and at least 50% was in counseling and coordination of care. Counseled on diabetes, avoiding  pressure ulcers, and signs of infection.

## 2015-08-24 ENCOUNTER — Telehealth: Payer: Self-pay | Admitting: Family Medicine

## 2015-08-24 DIAGNOSIS — E11610 Type 2 diabetes mellitus with diabetic neuropathic arthropathy: Secondary | ICD-10-CM | POA: Diagnosis not present

## 2015-08-24 DIAGNOSIS — N183 Chronic kidney disease, stage 3 (moderate): Secondary | ICD-10-CM | POA: Diagnosis not present

## 2015-08-24 DIAGNOSIS — M4804 Spinal stenosis, thoracic region: Secondary | ICD-10-CM | POA: Diagnosis not present

## 2015-08-24 DIAGNOSIS — M961 Postlaminectomy syndrome, not elsewhere classified: Secondary | ICD-10-CM | POA: Diagnosis not present

## 2015-08-24 DIAGNOSIS — G8929 Other chronic pain: Secondary | ICD-10-CM | POA: Diagnosis not present

## 2015-08-24 DIAGNOSIS — I129 Hypertensive chronic kidney disease with stage 1 through stage 4 chronic kidney disease, or unspecified chronic kidney disease: Secondary | ICD-10-CM | POA: Diagnosis not present

## 2015-08-24 DIAGNOSIS — E1142 Type 2 diabetes mellitus with diabetic polyneuropathy: Secondary | ICD-10-CM | POA: Diagnosis not present

## 2015-08-24 DIAGNOSIS — E1122 Type 2 diabetes mellitus with diabetic chronic kidney disease: Secondary | ICD-10-CM | POA: Diagnosis not present

## 2015-08-24 DIAGNOSIS — S82122D Displaced fracture of lateral condyle of left tibia, subsequent encounter for closed fracture with routine healing: Secondary | ICD-10-CM | POA: Diagnosis not present

## 2015-08-24 DIAGNOSIS — E785 Hyperlipidemia, unspecified: Secondary | ICD-10-CM | POA: Diagnosis not present

## 2015-08-24 DIAGNOSIS — J45998 Other asthma: Secondary | ICD-10-CM | POA: Diagnosis not present

## 2015-08-24 NOTE — Telephone Encounter (Signed)
Onalee Huaavid with Occupational therapy called and left message on voice mail regarding needing verification of amount of visits with the plan of care.  Please call David [phone]

## 2015-08-25 DIAGNOSIS — M4804 Spinal stenosis, thoracic region: Secondary | ICD-10-CM | POA: Diagnosis not present

## 2015-08-25 DIAGNOSIS — N183 Chronic kidney disease, stage 3 (moderate): Secondary | ICD-10-CM | POA: Diagnosis not present

## 2015-08-25 DIAGNOSIS — I129 Hypertensive chronic kidney disease with stage 1 through stage 4 chronic kidney disease, or unspecified chronic kidney disease: Secondary | ICD-10-CM | POA: Diagnosis not present

## 2015-08-25 DIAGNOSIS — M961 Postlaminectomy syndrome, not elsewhere classified: Secondary | ICD-10-CM | POA: Diagnosis not present

## 2015-08-25 DIAGNOSIS — S82122D Displaced fracture of lateral condyle of left tibia, subsequent encounter for closed fracture with routine healing: Secondary | ICD-10-CM | POA: Diagnosis not present

## 2015-08-25 DIAGNOSIS — E785 Hyperlipidemia, unspecified: Secondary | ICD-10-CM | POA: Diagnosis not present

## 2015-08-25 DIAGNOSIS — G8929 Other chronic pain: Secondary | ICD-10-CM | POA: Diagnosis not present

## 2015-08-25 DIAGNOSIS — E11610 Type 2 diabetes mellitus with diabetic neuropathic arthropathy: Secondary | ICD-10-CM | POA: Diagnosis not present

## 2015-08-25 DIAGNOSIS — E1122 Type 2 diabetes mellitus with diabetic chronic kidney disease: Secondary | ICD-10-CM | POA: Diagnosis not present

## 2015-08-25 DIAGNOSIS — E1142 Type 2 diabetes mellitus with diabetic polyneuropathy: Secondary | ICD-10-CM | POA: Diagnosis not present

## 2015-08-25 DIAGNOSIS — J45998 Other asthma: Secondary | ICD-10-CM | POA: Diagnosis not present

## 2015-08-25 NOTE — Telephone Encounter (Signed)
Tried to call Onalee HuaDavid again but mailbox is still full

## 2015-08-25 NOTE — Telephone Encounter (Signed)
They will need to contact whoever ordered the occupational therapy for her or just go with the standard number of visits that her insurance will cover.

## 2015-08-25 NOTE — Telephone Encounter (Signed)
Vickie,   I am not sure what this mean. Maybe for PT, is it okay to give like 10 visits?

## 2015-08-25 NOTE — Telephone Encounter (Signed)
Tried to call david about but [employer] mailbox is full

## 2015-08-28 ENCOUNTER — Telehealth: Payer: Self-pay | Admitting: Family Medicine

## 2015-08-28 NOTE — Telephone Encounter (Signed)
Look at other message

## 2015-08-28 NOTE — Telephone Encounter (Signed)
Cassandra Stewart with [facility] left voicemail to call him regarding pt  [phone]

## 2015-08-28 NOTE — Telephone Encounter (Signed)
Spoke to PeavineDavid and since she was discharged from the rehab center, the doctor there just initiated the OT for an eval visit but to keep the visit going it has to come from the pcp.  2 times a week for 4 weeks. i have approved this. And vickie was notified

## 2015-08-30 DIAGNOSIS — M961 Postlaminectomy syndrome, not elsewhere classified: Secondary | ICD-10-CM | POA: Diagnosis not present

## 2015-08-30 DIAGNOSIS — E1122 Type 2 diabetes mellitus with diabetic chronic kidney disease: Secondary | ICD-10-CM | POA: Diagnosis not present

## 2015-08-30 DIAGNOSIS — S82122D Displaced fracture of lateral condyle of left tibia, subsequent encounter for closed fracture with routine healing: Secondary | ICD-10-CM | POA: Diagnosis not present

## 2015-08-30 DIAGNOSIS — M4804 Spinal stenosis, thoracic region: Secondary | ICD-10-CM | POA: Diagnosis not present

## 2015-08-30 DIAGNOSIS — G8929 Other chronic pain: Secondary | ICD-10-CM | POA: Diagnosis not present

## 2015-08-30 DIAGNOSIS — I129 Hypertensive chronic kidney disease with stage 1 through stage 4 chronic kidney disease, or unspecified chronic kidney disease: Secondary | ICD-10-CM | POA: Diagnosis not present

## 2015-08-30 DIAGNOSIS — E1142 Type 2 diabetes mellitus with diabetic polyneuropathy: Secondary | ICD-10-CM | POA: Diagnosis not present

## 2015-08-30 DIAGNOSIS — E785 Hyperlipidemia, unspecified: Secondary | ICD-10-CM | POA: Diagnosis not present

## 2015-08-30 DIAGNOSIS — E11610 Type 2 diabetes mellitus with diabetic neuropathic arthropathy: Secondary | ICD-10-CM | POA: Diagnosis not present

## 2015-08-30 DIAGNOSIS — J45998 Other asthma: Secondary | ICD-10-CM | POA: Diagnosis not present

## 2015-08-30 DIAGNOSIS — N183 Chronic kidney disease, stage 3 (moderate): Secondary | ICD-10-CM | POA: Diagnosis not present

## 2015-08-31 DIAGNOSIS — J45998 Other asthma: Secondary | ICD-10-CM | POA: Diagnosis not present

## 2015-08-31 DIAGNOSIS — E785 Hyperlipidemia, unspecified: Secondary | ICD-10-CM | POA: Diagnosis not present

## 2015-08-31 DIAGNOSIS — S82122D Displaced fracture of lateral condyle of left tibia, subsequent encounter for closed fracture with routine healing: Secondary | ICD-10-CM | POA: Diagnosis not present

## 2015-08-31 DIAGNOSIS — E1122 Type 2 diabetes mellitus with diabetic chronic kidney disease: Secondary | ICD-10-CM | POA: Diagnosis not present

## 2015-08-31 DIAGNOSIS — I129 Hypertensive chronic kidney disease with stage 1 through stage 4 chronic kidney disease, or unspecified chronic kidney disease: Secondary | ICD-10-CM | POA: Diagnosis not present

## 2015-08-31 DIAGNOSIS — N183 Chronic kidney disease, stage 3 (moderate): Secondary | ICD-10-CM | POA: Diagnosis not present

## 2015-08-31 DIAGNOSIS — E11610 Type 2 diabetes mellitus with diabetic neuropathic arthropathy: Secondary | ICD-10-CM | POA: Diagnosis not present

## 2015-08-31 DIAGNOSIS — G8929 Other chronic pain: Secondary | ICD-10-CM | POA: Diagnosis not present

## 2015-08-31 DIAGNOSIS — E1142 Type 2 diabetes mellitus with diabetic polyneuropathy: Secondary | ICD-10-CM | POA: Diagnosis not present

## 2015-08-31 DIAGNOSIS — M4804 Spinal stenosis, thoracic region: Secondary | ICD-10-CM | POA: Diagnosis not present

## 2015-08-31 DIAGNOSIS — M961 Postlaminectomy syndrome, not elsewhere classified: Secondary | ICD-10-CM | POA: Diagnosis not present

## 2015-09-01 DIAGNOSIS — G8929 Other chronic pain: Secondary | ICD-10-CM | POA: Diagnosis not present

## 2015-09-01 DIAGNOSIS — E11610 Type 2 diabetes mellitus with diabetic neuropathic arthropathy: Secondary | ICD-10-CM | POA: Diagnosis not present

## 2015-09-01 DIAGNOSIS — S82122D Displaced fracture of lateral condyle of left tibia, subsequent encounter for closed fracture with routine healing: Secondary | ICD-10-CM | POA: Diagnosis not present

## 2015-09-01 DIAGNOSIS — I129 Hypertensive chronic kidney disease with stage 1 through stage 4 chronic kidney disease, or unspecified chronic kidney disease: Secondary | ICD-10-CM | POA: Diagnosis not present

## 2015-09-01 DIAGNOSIS — N183 Chronic kidney disease, stage 3 (moderate): Secondary | ICD-10-CM | POA: Diagnosis not present

## 2015-09-01 DIAGNOSIS — M4804 Spinal stenosis, thoracic region: Secondary | ICD-10-CM | POA: Diagnosis not present

## 2015-09-01 DIAGNOSIS — M961 Postlaminectomy syndrome, not elsewhere classified: Secondary | ICD-10-CM | POA: Diagnosis not present

## 2015-09-01 DIAGNOSIS — E785 Hyperlipidemia, unspecified: Secondary | ICD-10-CM | POA: Diagnosis not present

## 2015-09-01 DIAGNOSIS — E1122 Type 2 diabetes mellitus with diabetic chronic kidney disease: Secondary | ICD-10-CM | POA: Diagnosis not present

## 2015-09-01 DIAGNOSIS — E1142 Type 2 diabetes mellitus with diabetic polyneuropathy: Secondary | ICD-10-CM | POA: Diagnosis not present

## 2015-09-01 DIAGNOSIS — J45998 Other asthma: Secondary | ICD-10-CM | POA: Diagnosis not present

## 2015-09-03 DIAGNOSIS — M6281 Muscle weakness (generalized): Secondary | ICD-10-CM | POA: Diagnosis not present

## 2015-09-03 DIAGNOSIS — G6289 Other specified polyneuropathies: Secondary | ICD-10-CM | POA: Diagnosis not present

## 2015-09-04 DIAGNOSIS — G8929 Other chronic pain: Secondary | ICD-10-CM | POA: Diagnosis not present

## 2015-09-04 DIAGNOSIS — J45998 Other asthma: Secondary | ICD-10-CM | POA: Diagnosis not present

## 2015-09-04 DIAGNOSIS — S82122D Displaced fracture of lateral condyle of left tibia, subsequent encounter for closed fracture with routine healing: Secondary | ICD-10-CM | POA: Diagnosis not present

## 2015-09-04 DIAGNOSIS — I129 Hypertensive chronic kidney disease with stage 1 through stage 4 chronic kidney disease, or unspecified chronic kidney disease: Secondary | ICD-10-CM | POA: Diagnosis not present

## 2015-09-04 DIAGNOSIS — N183 Chronic kidney disease, stage 3 (moderate): Secondary | ICD-10-CM | POA: Diagnosis not present

## 2015-09-04 DIAGNOSIS — E11610 Type 2 diabetes mellitus with diabetic neuropathic arthropathy: Secondary | ICD-10-CM | POA: Diagnosis not present

## 2015-09-04 DIAGNOSIS — E1142 Type 2 diabetes mellitus with diabetic polyneuropathy: Secondary | ICD-10-CM | POA: Diagnosis not present

## 2015-09-04 DIAGNOSIS — M4804 Spinal stenosis, thoracic region: Secondary | ICD-10-CM | POA: Diagnosis not present

## 2015-09-04 DIAGNOSIS — E1122 Type 2 diabetes mellitus with diabetic chronic kidney disease: Secondary | ICD-10-CM | POA: Diagnosis not present

## 2015-09-04 DIAGNOSIS — E785 Hyperlipidemia, unspecified: Secondary | ICD-10-CM | POA: Diagnosis not present

## 2015-09-04 DIAGNOSIS — M961 Postlaminectomy syndrome, not elsewhere classified: Secondary | ICD-10-CM | POA: Diagnosis not present

## 2015-09-05 DIAGNOSIS — M4804 Spinal stenosis, thoracic region: Secondary | ICD-10-CM | POA: Diagnosis not present

## 2015-09-05 DIAGNOSIS — N183 Chronic kidney disease, stage 3 (moderate): Secondary | ICD-10-CM | POA: Diagnosis not present

## 2015-09-05 DIAGNOSIS — I129 Hypertensive chronic kidney disease with stage 1 through stage 4 chronic kidney disease, or unspecified chronic kidney disease: Secondary | ICD-10-CM | POA: Diagnosis not present

## 2015-09-05 DIAGNOSIS — J45998 Other asthma: Secondary | ICD-10-CM | POA: Diagnosis not present

## 2015-09-05 DIAGNOSIS — G8929 Other chronic pain: Secondary | ICD-10-CM | POA: Diagnosis not present

## 2015-09-05 DIAGNOSIS — S82122D Displaced fracture of lateral condyle of left tibia, subsequent encounter for closed fracture with routine healing: Secondary | ICD-10-CM | POA: Diagnosis not present

## 2015-09-05 DIAGNOSIS — E1122 Type 2 diabetes mellitus with diabetic chronic kidney disease: Secondary | ICD-10-CM | POA: Diagnosis not present

## 2015-09-05 DIAGNOSIS — E11610 Type 2 diabetes mellitus with diabetic neuropathic arthropathy: Secondary | ICD-10-CM | POA: Diagnosis not present

## 2015-09-05 DIAGNOSIS — E785 Hyperlipidemia, unspecified: Secondary | ICD-10-CM | POA: Diagnosis not present

## 2015-09-05 DIAGNOSIS — M961 Postlaminectomy syndrome, not elsewhere classified: Secondary | ICD-10-CM | POA: Diagnosis not present

## 2015-09-05 DIAGNOSIS — E1142 Type 2 diabetes mellitus with diabetic polyneuropathy: Secondary | ICD-10-CM | POA: Diagnosis not present

## 2015-09-06 DIAGNOSIS — G8929 Other chronic pain: Secondary | ICD-10-CM | POA: Diagnosis not present

## 2015-09-06 DIAGNOSIS — M961 Postlaminectomy syndrome, not elsewhere classified: Secondary | ICD-10-CM | POA: Diagnosis not present

## 2015-09-06 DIAGNOSIS — E785 Hyperlipidemia, unspecified: Secondary | ICD-10-CM | POA: Diagnosis not present

## 2015-09-06 DIAGNOSIS — J45998 Other asthma: Secondary | ICD-10-CM | POA: Diagnosis not present

## 2015-09-06 DIAGNOSIS — S82122D Displaced fracture of lateral condyle of left tibia, subsequent encounter for closed fracture with routine healing: Secondary | ICD-10-CM | POA: Diagnosis not present

## 2015-09-06 DIAGNOSIS — I129 Hypertensive chronic kidney disease with stage 1 through stage 4 chronic kidney disease, or unspecified chronic kidney disease: Secondary | ICD-10-CM | POA: Diagnosis not present

## 2015-09-06 DIAGNOSIS — E1122 Type 2 diabetes mellitus with diabetic chronic kidney disease: Secondary | ICD-10-CM | POA: Diagnosis not present

## 2015-09-06 DIAGNOSIS — M4804 Spinal stenosis, thoracic region: Secondary | ICD-10-CM | POA: Diagnosis not present

## 2015-09-06 DIAGNOSIS — E11610 Type 2 diabetes mellitus with diabetic neuropathic arthropathy: Secondary | ICD-10-CM | POA: Diagnosis not present

## 2015-09-06 DIAGNOSIS — E1142 Type 2 diabetes mellitus with diabetic polyneuropathy: Secondary | ICD-10-CM | POA: Diagnosis not present

## 2015-09-06 DIAGNOSIS — N183 Chronic kidney disease, stage 3 (moderate): Secondary | ICD-10-CM | POA: Diagnosis not present

## 2015-09-07 DIAGNOSIS — E785 Hyperlipidemia, unspecified: Secondary | ICD-10-CM | POA: Diagnosis not present

## 2015-09-07 DIAGNOSIS — G8929 Other chronic pain: Secondary | ICD-10-CM | POA: Diagnosis not present

## 2015-09-07 DIAGNOSIS — I129 Hypertensive chronic kidney disease with stage 1 through stage 4 chronic kidney disease, or unspecified chronic kidney disease: Secondary | ICD-10-CM | POA: Diagnosis not present

## 2015-09-07 DIAGNOSIS — M961 Postlaminectomy syndrome, not elsewhere classified: Secondary | ICD-10-CM | POA: Diagnosis not present

## 2015-09-07 DIAGNOSIS — E1142 Type 2 diabetes mellitus with diabetic polyneuropathy: Secondary | ICD-10-CM | POA: Diagnosis not present

## 2015-09-07 DIAGNOSIS — J45998 Other asthma: Secondary | ICD-10-CM | POA: Diagnosis not present

## 2015-09-07 DIAGNOSIS — N183 Chronic kidney disease, stage 3 (moderate): Secondary | ICD-10-CM | POA: Diagnosis not present

## 2015-09-07 DIAGNOSIS — E1122 Type 2 diabetes mellitus with diabetic chronic kidney disease: Secondary | ICD-10-CM | POA: Diagnosis not present

## 2015-09-07 DIAGNOSIS — E11610 Type 2 diabetes mellitus with diabetic neuropathic arthropathy: Secondary | ICD-10-CM | POA: Diagnosis not present

## 2015-09-07 DIAGNOSIS — S82122D Displaced fracture of lateral condyle of left tibia, subsequent encounter for closed fracture with routine healing: Secondary | ICD-10-CM | POA: Diagnosis not present

## 2015-09-07 DIAGNOSIS — M4804 Spinal stenosis, thoracic region: Secondary | ICD-10-CM | POA: Diagnosis not present

## 2015-09-08 DIAGNOSIS — E785 Hyperlipidemia, unspecified: Secondary | ICD-10-CM | POA: Diagnosis not present

## 2015-09-08 DIAGNOSIS — J45998 Other asthma: Secondary | ICD-10-CM | POA: Diagnosis not present

## 2015-09-08 DIAGNOSIS — E11610 Type 2 diabetes mellitus with diabetic neuropathic arthropathy: Secondary | ICD-10-CM | POA: Diagnosis not present

## 2015-09-08 DIAGNOSIS — I129 Hypertensive chronic kidney disease with stage 1 through stage 4 chronic kidney disease, or unspecified chronic kidney disease: Secondary | ICD-10-CM | POA: Diagnosis not present

## 2015-09-08 DIAGNOSIS — M961 Postlaminectomy syndrome, not elsewhere classified: Secondary | ICD-10-CM | POA: Diagnosis not present

## 2015-09-08 DIAGNOSIS — S82122D Displaced fracture of lateral condyle of left tibia, subsequent encounter for closed fracture with routine healing: Secondary | ICD-10-CM | POA: Diagnosis not present

## 2015-09-08 DIAGNOSIS — M4804 Spinal stenosis, thoracic region: Secondary | ICD-10-CM | POA: Diagnosis not present

## 2015-09-08 DIAGNOSIS — E1142 Type 2 diabetes mellitus with diabetic polyneuropathy: Secondary | ICD-10-CM | POA: Diagnosis not present

## 2015-09-08 DIAGNOSIS — E1122 Type 2 diabetes mellitus with diabetic chronic kidney disease: Secondary | ICD-10-CM | POA: Diagnosis not present

## 2015-09-08 DIAGNOSIS — G8929 Other chronic pain: Secondary | ICD-10-CM | POA: Diagnosis not present

## 2015-09-08 DIAGNOSIS — N183 Chronic kidney disease, stage 3 (moderate): Secondary | ICD-10-CM | POA: Diagnosis not present

## 2015-09-11 ENCOUNTER — Telehealth: Payer: Self-pay

## 2015-09-11 ENCOUNTER — Other Ambulatory Visit: Payer: Self-pay | Admitting: Family Medicine

## 2015-09-11 DIAGNOSIS — E11610 Type 2 diabetes mellitus with diabetic neuropathic arthropathy: Secondary | ICD-10-CM | POA: Diagnosis not present

## 2015-09-11 DIAGNOSIS — N183 Chronic kidney disease, stage 3 (moderate): Secondary | ICD-10-CM | POA: Diagnosis not present

## 2015-09-11 DIAGNOSIS — J45998 Other asthma: Secondary | ICD-10-CM | POA: Diagnosis not present

## 2015-09-11 DIAGNOSIS — E785 Hyperlipidemia, unspecified: Secondary | ICD-10-CM | POA: Diagnosis not present

## 2015-09-11 DIAGNOSIS — M961 Postlaminectomy syndrome, not elsewhere classified: Secondary | ICD-10-CM | POA: Diagnosis not present

## 2015-09-11 DIAGNOSIS — I129 Hypertensive chronic kidney disease with stage 1 through stage 4 chronic kidney disease, or unspecified chronic kidney disease: Secondary | ICD-10-CM | POA: Diagnosis not present

## 2015-09-11 DIAGNOSIS — M4804 Spinal stenosis, thoracic region: Secondary | ICD-10-CM | POA: Diagnosis not present

## 2015-09-11 DIAGNOSIS — S82122D Displaced fracture of lateral condyle of left tibia, subsequent encounter for closed fracture with routine healing: Secondary | ICD-10-CM | POA: Diagnosis not present

## 2015-09-11 DIAGNOSIS — G8929 Other chronic pain: Secondary | ICD-10-CM | POA: Diagnosis not present

## 2015-09-11 DIAGNOSIS — E1122 Type 2 diabetes mellitus with diabetic chronic kidney disease: Secondary | ICD-10-CM | POA: Diagnosis not present

## 2015-09-11 DIAGNOSIS — E1142 Type 2 diabetes mellitus with diabetic polyneuropathy: Secondary | ICD-10-CM | POA: Diagnosis not present

## 2015-09-11 NOTE — Telephone Encounter (Signed)
Records rcvd from Dr. Gaye AlkenMelton- placed in your folder for review. Cassandra Stewart/Cassandra Stewart

## 2015-09-11 NOTE — Telephone Encounter (Signed)
Is it okay to refill all these meds

## 2015-09-12 DIAGNOSIS — E1142 Type 2 diabetes mellitus with diabetic polyneuropathy: Secondary | ICD-10-CM | POA: Diagnosis not present

## 2015-09-12 DIAGNOSIS — S82122D Displaced fracture of lateral condyle of left tibia, subsequent encounter for closed fracture with routine healing: Secondary | ICD-10-CM | POA: Diagnosis not present

## 2015-09-12 DIAGNOSIS — E11610 Type 2 diabetes mellitus with diabetic neuropathic arthropathy: Secondary | ICD-10-CM | POA: Diagnosis not present

## 2015-09-12 DIAGNOSIS — E1122 Type 2 diabetes mellitus with diabetic chronic kidney disease: Secondary | ICD-10-CM | POA: Diagnosis not present

## 2015-09-12 DIAGNOSIS — G8929 Other chronic pain: Secondary | ICD-10-CM | POA: Diagnosis not present

## 2015-09-12 DIAGNOSIS — N183 Chronic kidney disease, stage 3 (moderate): Secondary | ICD-10-CM | POA: Diagnosis not present

## 2015-09-12 DIAGNOSIS — M961 Postlaminectomy syndrome, not elsewhere classified: Secondary | ICD-10-CM | POA: Diagnosis not present

## 2015-09-12 DIAGNOSIS — J45998 Other asthma: Secondary | ICD-10-CM | POA: Diagnosis not present

## 2015-09-12 DIAGNOSIS — M4804 Spinal stenosis, thoracic region: Secondary | ICD-10-CM | POA: Diagnosis not present

## 2015-09-12 DIAGNOSIS — I129 Hypertensive chronic kidney disease with stage 1 through stage 4 chronic kidney disease, or unspecified chronic kidney disease: Secondary | ICD-10-CM | POA: Diagnosis not present

## 2015-09-12 DIAGNOSIS — E785 Hyperlipidemia, unspecified: Secondary | ICD-10-CM | POA: Diagnosis not present

## 2015-09-12 NOTE — Telephone Encounter (Signed)
Pt is coming in tomorrow if she can get transportation to bring her. Pt was advised to bring all her med bottles even if empty to get the correct meds into the computer for her to get refills. Pt was advised if she can not been seen tomorrow then she must get in with someone else to go over meds. i am denying these meds until tomorrow when we go over meds. i wll refill at her visit

## 2015-09-12 NOTE — Telephone Encounter (Signed)
Please call the patient and let her know that 3 of her medications that are being requested for refills are different from what [place] for her. They must have been changed while she was in the rehab facility.  Please get the records from her most recent rehab stay.

## 2015-09-12 NOTE — Telephone Encounter (Signed)
Patient medication list needs to be update and confirmed before I can refill medications for her. I will do this tomorrow if she is able to make it to her visit.

## 2015-09-13 ENCOUNTER — Ambulatory Visit (INDEPENDENT_AMBULATORY_CARE_PROVIDER_SITE_OTHER): Payer: Medicare Other | Admitting: Family Medicine

## 2015-09-13 ENCOUNTER — Encounter: Payer: Self-pay | Admitting: Family Medicine

## 2015-09-13 ENCOUNTER — Other Ambulatory Visit: Payer: Self-pay | Admitting: Family Medicine

## 2015-09-13 VITALS — BP 128/78 | HR 64 | Temp 97.9°F

## 2015-09-13 DIAGNOSIS — R7989 Other specified abnormal findings of blood chemistry: Secondary | ICD-10-CM

## 2015-09-13 DIAGNOSIS — E785 Hyperlipidemia, unspecified: Secondary | ICD-10-CM | POA: Diagnosis not present

## 2015-09-13 DIAGNOSIS — IMO0001 Reserved for inherently not codable concepts without codable children: Secondary | ICD-10-CM

## 2015-09-13 DIAGNOSIS — S82122D Displaced fracture of lateral condyle of left tibia, subsequent encounter for closed fracture with routine healing: Secondary | ICD-10-CM | POA: Diagnosis not present

## 2015-09-13 DIAGNOSIS — R945 Abnormal results of liver function studies: Secondary | ICD-10-CM

## 2015-09-13 DIAGNOSIS — Z794 Long term (current) use of insulin: Secondary | ICD-10-CM

## 2015-09-13 DIAGNOSIS — J45998 Other asthma: Secondary | ICD-10-CM | POA: Diagnosis not present

## 2015-09-13 DIAGNOSIS — E11610 Type 2 diabetes mellitus with diabetic neuropathic arthropathy: Secondary | ICD-10-CM | POA: Diagnosis not present

## 2015-09-13 DIAGNOSIS — R262 Difficulty in walking, not elsewhere classified: Secondary | ICD-10-CM

## 2015-09-13 DIAGNOSIS — G8929 Other chronic pain: Secondary | ICD-10-CM | POA: Diagnosis not present

## 2015-09-13 DIAGNOSIS — M25562 Pain in left knee: Secondary | ICD-10-CM

## 2015-09-13 DIAGNOSIS — E1142 Type 2 diabetes mellitus with diabetic polyneuropathy: Secondary | ICD-10-CM | POA: Diagnosis not present

## 2015-09-13 DIAGNOSIS — I10 Essential (primary) hypertension: Secondary | ICD-10-CM

## 2015-09-13 DIAGNOSIS — N183 Chronic kidney disease, stage 3 (moderate): Secondary | ICD-10-CM | POA: Diagnosis not present

## 2015-09-13 DIAGNOSIS — M4804 Spinal stenosis, thoracic region: Secondary | ICD-10-CM | POA: Diagnosis not present

## 2015-09-13 DIAGNOSIS — R29898 Other symptoms and signs involving the musculoskeletal system: Secondary | ICD-10-CM

## 2015-09-13 DIAGNOSIS — R600 Localized edema: Secondary | ICD-10-CM | POA: Diagnosis not present

## 2015-09-13 DIAGNOSIS — E1165 Type 2 diabetes mellitus with hyperglycemia: Secondary | ICD-10-CM

## 2015-09-13 DIAGNOSIS — M961 Postlaminectomy syndrome, not elsewhere classified: Secondary | ICD-10-CM | POA: Diagnosis not present

## 2015-09-13 DIAGNOSIS — I129 Hypertensive chronic kidney disease with stage 1 through stage 4 chronic kidney disease, or unspecified chronic kidney disease: Secondary | ICD-10-CM | POA: Diagnosis not present

## 2015-09-13 DIAGNOSIS — E1122 Type 2 diabetes mellitus with diabetic chronic kidney disease: Secondary | ICD-10-CM | POA: Diagnosis not present

## 2015-09-13 LAB — CBC WITH DIFFERENTIAL/PLATELET
BASOS ABS: 52 {cells}/uL (ref 0–200)
Basophils Relative: 1 %
EOS PCT: 3 %
Eosinophils Absolute: 156 cells/uL (ref 15–500)
HCT: 41.4 % (ref 35.0–45.0)
Hemoglobin: 13 g/dL (ref 11.7–15.5)
LYMPHS ABS: 2184 {cells}/uL (ref 850–3900)
Lymphocytes Relative: 42 %
MCH: 28.2 pg (ref 27.0–33.0)
MCHC: 31.4 g/dL — AB (ref 32.0–36.0)
MCV: 89.8 fL (ref 80.0–100.0)
MONOS PCT: 8 %
MPV: 12.2 fL (ref 7.5–12.5)
Monocytes Absolute: 416 cells/uL (ref 200–950)
NEUTROS PCT: 46 %
Neutro Abs: 2392 cells/uL (ref 1500–7800)
PLATELETS: 313 10*3/uL (ref 140–400)
RBC: 4.61 MIL/uL (ref 3.80–5.10)
RDW: 13.3 % (ref 11.0–15.0)
WBC: 5.2 10*3/uL (ref 4.0–10.5)

## 2015-09-13 MED ORDER — LISINOPRIL 10 MG PO TABS: 10.0000 mg | ORAL_TABLET | Freq: Every morning | ORAL | Status: DC

## 2015-09-13 MED ORDER — LOVASTATIN 40 MG PO TABS: 40.0000 mg | ORAL_TABLET | Freq: Every day | ORAL | Status: DC

## 2015-09-13 MED ORDER — METFORMIN HCL 500 MG PO TABS: 500.0000 mg | ORAL_TABLET | Freq: Two times a day (BID) | ORAL | Status: DC

## 2015-09-13 MED ORDER — GLUCOSE BLOOD VI STRP: ORAL_STRIP | Status: DC

## 2015-09-13 MED ORDER — ACCU-CHEK SOFTCLIX LANCETS MISC: Status: DC

## 2015-09-13 MED ORDER — INSULIN GLARGINE 300 UNIT/ML ~~LOC~~ SOPN: 45.0000 [IU] | PEN_INJECTOR | Freq: Every day | SUBCUTANEOUS | Status: DC

## 2015-09-13 MED ORDER — INSULIN LISPRO 100 UNIT/ML (KWIKPEN): 18.0000 [IU] | PEN_INJECTOR | Freq: Three times a day (TID) | SUBCUTANEOUS | Status: DC

## 2015-09-13 MED ORDER — HYDROCHLOROTHIAZIDE 12.5 MG PO CAPS: 12.5000 mg | ORAL_CAPSULE | Freq: Every day | ORAL | Status: DC

## 2015-09-13 MED ORDER — AMLODIPINE BESYLATE 10 MG PO TABS: 10.0000 mg | ORAL_TABLET | Freq: Every morning | ORAL | Status: DC

## 2015-09-13 NOTE — Progress Notes (Signed)
Subjective:    Patient ID: Cassandra Stewart, female    DOB: 1950/09/29, 65 y.o.   MRN: [id number]  HPI Chief Complaint  Patient presents with  . med check    med check- leg and feet swelling- left leg mainly- always in pain  . wants another wheelchair    wants a mobilize wheelchair to [facility]   She is here today for medication refill requests. She brought in her mediations for us to confirm correct med and dosages.  She saw her neurosurgeon 2 days ago and states he was pleased with her wound but that she has not progressed like she should as far as activity level. She states he will see her again in 6 weeks and if she has not progressed any more then he plans to order another MRI.  She complains of pain in anterior left lower leg. Denies calf pain.  States she continues to have left leg weakness and states this does not appear any better than our last visit.   She is standing for about a minute.  Is now transferring herself to her toilet. She cooked for the first time last week but did not while sitting in her chair. Her daughter has been helping her.  Continues to have pain in left knee and states it feels like it is going to "slip out". Also complains of pain behind the knee. Had a CT in May that showed the following  FINDINGS: There is no tibial plateau fracture. There is slight fragmentation of the lateral tibial spine which may represent a recent avulsion. There is no significant effusion. There is mild prepatellar soft tissue edema.  IMPRESSION: No tibial plateau fracture. Slight fragmentation of the lateral tibial spine might represent a recent avulsion injury. She is requesting to see an orthopedist.   She is having PT twice weekly And OT twice weekly Nurse is coming to her home once or twice weekly. Nurse was there yesterday. Told her that her left foot and lower leg was swollen. Denies numbness, tingling.    HTN: states she is not checking BP at home but [facility]  is and it has been in normal range.  Diabetes: she has not had a meter, states her grandchild threw it in the toilet. Has not been checking blood sugars at home. States she adjusts her dose of insulin "based on how I feel".   Denies fever, chills, headache, dizziness, blurred or double vision, chest pain, shortness of breath, cough, abdominal pain, GI or GU symptoms.   Reviewed allergies, medications, past medical history.   Review of Systems Pertinent positives and negatives in the history of present illness.     Objective:   Physical Exam  Constitutional: She is oriented to person, place, and time. She appears well-developed and well-nourished. No distress.  She is in a wheelchair.   Cardiovascular: Normal rate, regular rhythm, normal heart sounds, intact distal pulses and normal pulses.   Pulmonary/Chest: Effort normal and breath sounds normal.  Musculoskeletal:       Left knee: She exhibits no swelling, no effusion, no erythema, no LCL laxity, normal patellar mobility and no MCL laxity. Tenderness found.  Difficult to fully assess due to severe weakness in that extremity.  Mild edema, non pitting to left ankle. Normal cap refill and pulses.   Neurological: She is alert and oriented to person, place, and time. No sensory deficit.  Skin: Skin is warm and dry. No rash noted. No pallor.  Psychiatric: She has  a normal mood and affect. Her speech is normal and behavior is normal. Judgment and thought content normal. Cognition and memory are normal.   BP 128/78 mmHg  Pulse 64  Temp(Src) 97.9 F (36.6 C) (Oral)       Assessment & Plan:  Essential hypertension - Plan: CBC with Differential/Platelet, COMPLETE METABOLIC PANEL WITH GFR  Weakness of left leg  Morbid obesity, unspecified obesity type (HCC)  Inability to walk  Elevated LFTs - Plan: COMPLETE METABOLIC PANEL WITH GFR  Uncontrolled type 2 diabetes mellitus without complication, with long-term current use of insulin  (HCC) - Plan: COMPLETE METABOLIC PANEL WITH GFR  Leg edema, left - Plan: CBC with Differential/Platelet, COMPLETE METABOLIC PANEL WITH GFR  Knee pain, chronic, left - Plan: Ambulatory referral to Orthopedic Surgery  Called [facility] and left a message for them to fax her discharge summary to [place]. Request was sent but have not received any information from them. She spent several weeks from mid May until late June in this rehab facility.  Was given a new meter today and strongly encouraged to start checking her blood sugar twice daily and would like for her to call to give me the blood sugar readings in 2 weeks. Discussed that I cannot tell her if her medications for diabetes is at the appropriate dosages since I do not know what her blood sugars are running.  The left ankle does have some mild edema and I recommend elevation and trying compression stocking. Prescription given to patient for compression stockings. Referral made to orthopedist for left knee pain.

## 2015-09-13 NOTE — Patient Instructions (Addendum)
Check your blood sugars at home twice daily. Once in the morning fasting and then 2 hours after lunch or supper. Call in 2 weeks and let me know what your blood sugar readings are. We refilled your medications.  I am giving you a prescription for compression stocking for your left leg. Discussed this with your physical therapist. I think this would help with the swelling in your left ankle. Keep it elevated when you can.   I am referring you to or an orthopedist for persistent knee pain. They will call you to schedule an appointment.  We will call you with lab results. Follow-up in one month for diabetes check.

## 2015-09-14 DIAGNOSIS — M961 Postlaminectomy syndrome, not elsewhere classified: Secondary | ICD-10-CM | POA: Diagnosis not present

## 2015-09-14 DIAGNOSIS — J45998 Other asthma: Secondary | ICD-10-CM | POA: Diagnosis not present

## 2015-09-14 DIAGNOSIS — S82122D Displaced fracture of lateral condyle of left tibia, subsequent encounter for closed fracture with routine healing: Secondary | ICD-10-CM | POA: Diagnosis not present

## 2015-09-14 DIAGNOSIS — E785 Hyperlipidemia, unspecified: Secondary | ICD-10-CM | POA: Diagnosis not present

## 2015-09-14 DIAGNOSIS — G8929 Other chronic pain: Secondary | ICD-10-CM | POA: Diagnosis not present

## 2015-09-14 DIAGNOSIS — E1142 Type 2 diabetes mellitus with diabetic polyneuropathy: Secondary | ICD-10-CM | POA: Diagnosis not present

## 2015-09-14 DIAGNOSIS — I129 Hypertensive chronic kidney disease with stage 1 through stage 4 chronic kidney disease, or unspecified chronic kidney disease: Secondary | ICD-10-CM | POA: Diagnosis not present

## 2015-09-14 DIAGNOSIS — N183 Chronic kidney disease, stage 3 (moderate): Secondary | ICD-10-CM | POA: Diagnosis not present

## 2015-09-14 DIAGNOSIS — M4804 Spinal stenosis, thoracic region: Secondary | ICD-10-CM | POA: Diagnosis not present

## 2015-09-14 DIAGNOSIS — E1122 Type 2 diabetes mellitus with diabetic chronic kidney disease: Secondary | ICD-10-CM | POA: Diagnosis not present

## 2015-09-14 DIAGNOSIS — E11610 Type 2 diabetes mellitus with diabetic neuropathic arthropathy: Secondary | ICD-10-CM | POA: Diagnosis not present

## 2015-09-14 LAB — COMPLETE METABOLIC PANEL WITH GFR
ALK PHOS: 76 U/L (ref 33–130)
ALT: 65 U/L — ABNORMAL HIGH (ref 6–29)
AST: 69 U/L — AB (ref 10–35)
Albumin: 4 g/dL (ref 3.6–5.1)
BUN: 12 mg/dL (ref 7–25)
CHLORIDE: 103 mmol/L (ref 98–110)
CO2: 24 mmol/L (ref 20–31)
Calcium: 9.6 mg/dL (ref 8.6–10.4)
Creat: 0.94 mg/dL (ref 0.50–0.99)
GFR, EST NON AFRICAN AMERICAN: 63 mL/min (ref >=60)
GFR, Est African American: 73 mL/min (ref >=60)
GLUCOSE: 274 mg/dL — AB (ref 65–99)
POTASSIUM: 4.9 mmol/L (ref 3.5–5.3)
SODIUM: 140 mmol/L (ref 135–146)
Total Bilirubin: 0.6 mg/dL (ref 0.2–1.2)
Total Protein: 7.3 g/dL (ref 6.1–8.1)

## 2015-09-15 DIAGNOSIS — I129 Hypertensive chronic kidney disease with stage 1 through stage 4 chronic kidney disease, or unspecified chronic kidney disease: Secondary | ICD-10-CM | POA: Diagnosis not present

## 2015-09-15 DIAGNOSIS — E1122 Type 2 diabetes mellitus with diabetic chronic kidney disease: Secondary | ICD-10-CM | POA: Diagnosis not present

## 2015-09-15 DIAGNOSIS — M961 Postlaminectomy syndrome, not elsewhere classified: Secondary | ICD-10-CM | POA: Diagnosis not present

## 2015-09-15 DIAGNOSIS — G8929 Other chronic pain: Secondary | ICD-10-CM | POA: Diagnosis not present

## 2015-09-15 DIAGNOSIS — E11610 Type 2 diabetes mellitus with diabetic neuropathic arthropathy: Secondary | ICD-10-CM | POA: Diagnosis not present

## 2015-09-15 DIAGNOSIS — E785 Hyperlipidemia, unspecified: Secondary | ICD-10-CM | POA: Diagnosis not present

## 2015-09-15 DIAGNOSIS — S82122D Displaced fracture of lateral condyle of left tibia, subsequent encounter for closed fracture with routine healing: Secondary | ICD-10-CM | POA: Diagnosis not present

## 2015-09-15 DIAGNOSIS — N183 Chronic kidney disease, stage 3 (moderate): Secondary | ICD-10-CM | POA: Diagnosis not present

## 2015-09-15 DIAGNOSIS — M4804 Spinal stenosis, thoracic region: Secondary | ICD-10-CM | POA: Diagnosis not present

## 2015-09-15 DIAGNOSIS — E1142 Type 2 diabetes mellitus with diabetic polyneuropathy: Secondary | ICD-10-CM | POA: Diagnosis not present

## 2015-09-15 DIAGNOSIS — J45998 Other asthma: Secondary | ICD-10-CM | POA: Diagnosis not present

## 2015-09-18 DIAGNOSIS — S82122D Displaced fracture of lateral condyle of left tibia, subsequent encounter for closed fracture with routine healing: Secondary | ICD-10-CM | POA: Diagnosis not present

## 2015-09-18 DIAGNOSIS — M961 Postlaminectomy syndrome, not elsewhere classified: Secondary | ICD-10-CM | POA: Diagnosis not present

## 2015-09-18 DIAGNOSIS — E785 Hyperlipidemia, unspecified: Secondary | ICD-10-CM | POA: Diagnosis not present

## 2015-09-18 DIAGNOSIS — N183 Chronic kidney disease, stage 3 (moderate): Secondary | ICD-10-CM | POA: Diagnosis not present

## 2015-09-18 DIAGNOSIS — E11610 Type 2 diabetes mellitus with diabetic neuropathic arthropathy: Secondary | ICD-10-CM | POA: Diagnosis not present

## 2015-09-18 DIAGNOSIS — E1122 Type 2 diabetes mellitus with diabetic chronic kidney disease: Secondary | ICD-10-CM | POA: Diagnosis not present

## 2015-09-18 DIAGNOSIS — J45998 Other asthma: Secondary | ICD-10-CM | POA: Diagnosis not present

## 2015-09-18 DIAGNOSIS — E1142 Type 2 diabetes mellitus with diabetic polyneuropathy: Secondary | ICD-10-CM | POA: Diagnosis not present

## 2015-09-18 DIAGNOSIS — M4804 Spinal stenosis, thoracic region: Secondary | ICD-10-CM | POA: Diagnosis not present

## 2015-09-18 DIAGNOSIS — G8929 Other chronic pain: Secondary | ICD-10-CM | POA: Diagnosis not present

## 2015-09-18 DIAGNOSIS — I129 Hypertensive chronic kidney disease with stage 1 through stage 4 chronic kidney disease, or unspecified chronic kidney disease: Secondary | ICD-10-CM | POA: Diagnosis not present

## 2015-09-18 NOTE — Telephone Encounter (Signed)
Okay 

## 2015-09-18 NOTE — Telephone Encounter (Signed)
Therapist called wanted to extend pt occupational therapy for 3 more weeks 2 times per week.  He wants pt to be able to go to toilet by herself from wheel chair.  She has made significant progress.  Please call therapist [phone]

## 2015-09-18 NOTE — Telephone Encounter (Signed)
Occupational therapy was informed Dr.Lalonde said okay

## 2015-09-20 DIAGNOSIS — J45998 Other asthma: Secondary | ICD-10-CM | POA: Diagnosis not present

## 2015-09-20 DIAGNOSIS — E785 Hyperlipidemia, unspecified: Secondary | ICD-10-CM | POA: Diagnosis not present

## 2015-09-20 DIAGNOSIS — S82122D Displaced fracture of lateral condyle of left tibia, subsequent encounter for closed fracture with routine healing: Secondary | ICD-10-CM | POA: Diagnosis not present

## 2015-09-20 DIAGNOSIS — E1142 Type 2 diabetes mellitus with diabetic polyneuropathy: Secondary | ICD-10-CM | POA: Diagnosis not present

## 2015-09-20 DIAGNOSIS — E1122 Type 2 diabetes mellitus with diabetic chronic kidney disease: Secondary | ICD-10-CM | POA: Diagnosis not present

## 2015-09-20 DIAGNOSIS — G8929 Other chronic pain: Secondary | ICD-10-CM | POA: Diagnosis not present

## 2015-09-20 DIAGNOSIS — E11610 Type 2 diabetes mellitus with diabetic neuropathic arthropathy: Secondary | ICD-10-CM | POA: Diagnosis not present

## 2015-09-20 DIAGNOSIS — M961 Postlaminectomy syndrome, not elsewhere classified: Secondary | ICD-10-CM | POA: Diagnosis not present

## 2015-09-20 DIAGNOSIS — I129 Hypertensive chronic kidney disease with stage 1 through stage 4 chronic kidney disease, or unspecified chronic kidney disease: Secondary | ICD-10-CM | POA: Diagnosis not present

## 2015-09-20 DIAGNOSIS — N183 Chronic kidney disease, stage 3 (moderate): Secondary | ICD-10-CM | POA: Diagnosis not present

## 2015-09-20 DIAGNOSIS — M4804 Spinal stenosis, thoracic region: Secondary | ICD-10-CM | POA: Diagnosis not present

## 2015-09-21 DIAGNOSIS — M961 Postlaminectomy syndrome, not elsewhere classified: Secondary | ICD-10-CM | POA: Diagnosis not present

## 2015-09-21 DIAGNOSIS — J45998 Other asthma: Secondary | ICD-10-CM | POA: Diagnosis not present

## 2015-09-21 DIAGNOSIS — S82122D Displaced fracture of lateral condyle of left tibia, subsequent encounter for closed fracture with routine healing: Secondary | ICD-10-CM | POA: Diagnosis not present

## 2015-09-21 DIAGNOSIS — E785 Hyperlipidemia, unspecified: Secondary | ICD-10-CM | POA: Diagnosis not present

## 2015-09-21 DIAGNOSIS — E1122 Type 2 diabetes mellitus with diabetic chronic kidney disease: Secondary | ICD-10-CM | POA: Diagnosis not present

## 2015-09-21 DIAGNOSIS — E11610 Type 2 diabetes mellitus with diabetic neuropathic arthropathy: Secondary | ICD-10-CM | POA: Diagnosis not present

## 2015-09-21 DIAGNOSIS — I129 Hypertensive chronic kidney disease with stage 1 through stage 4 chronic kidney disease, or unspecified chronic kidney disease: Secondary | ICD-10-CM | POA: Diagnosis not present

## 2015-09-21 DIAGNOSIS — E1142 Type 2 diabetes mellitus with diabetic polyneuropathy: Secondary | ICD-10-CM | POA: Diagnosis not present

## 2015-09-21 DIAGNOSIS — M4804 Spinal stenosis, thoracic region: Secondary | ICD-10-CM | POA: Diagnosis not present

## 2015-09-21 DIAGNOSIS — N183 Chronic kidney disease, stage 3 (moderate): Secondary | ICD-10-CM | POA: Diagnosis not present

## 2015-09-21 DIAGNOSIS — G8929 Other chronic pain: Secondary | ICD-10-CM | POA: Diagnosis not present

## 2015-09-22 DIAGNOSIS — M961 Postlaminectomy syndrome, not elsewhere classified: Secondary | ICD-10-CM | POA: Diagnosis not present

## 2015-09-22 DIAGNOSIS — E11610 Type 2 diabetes mellitus with diabetic neuropathic arthropathy: Secondary | ICD-10-CM | POA: Diagnosis not present

## 2015-09-22 DIAGNOSIS — N183 Chronic kidney disease, stage 3 (moderate): Secondary | ICD-10-CM | POA: Diagnosis not present

## 2015-09-22 DIAGNOSIS — G8929 Other chronic pain: Secondary | ICD-10-CM | POA: Diagnosis not present

## 2015-09-22 DIAGNOSIS — I129 Hypertensive chronic kidney disease with stage 1 through stage 4 chronic kidney disease, or unspecified chronic kidney disease: Secondary | ICD-10-CM | POA: Diagnosis not present

## 2015-09-22 DIAGNOSIS — S82122D Displaced fracture of lateral condyle of left tibia, subsequent encounter for closed fracture with routine healing: Secondary | ICD-10-CM | POA: Diagnosis not present

## 2015-09-22 DIAGNOSIS — M4804 Spinal stenosis, thoracic region: Secondary | ICD-10-CM | POA: Diagnosis not present

## 2015-09-22 DIAGNOSIS — E1122 Type 2 diabetes mellitus with diabetic chronic kidney disease: Secondary | ICD-10-CM | POA: Diagnosis not present

## 2015-09-22 DIAGNOSIS — E785 Hyperlipidemia, unspecified: Secondary | ICD-10-CM | POA: Diagnosis not present

## 2015-09-22 DIAGNOSIS — J45998 Other asthma: Secondary | ICD-10-CM | POA: Diagnosis not present

## 2015-09-22 DIAGNOSIS — E1142 Type 2 diabetes mellitus with diabetic polyneuropathy: Secondary | ICD-10-CM | POA: Diagnosis not present

## 2015-09-25 DIAGNOSIS — G8929 Other chronic pain: Secondary | ICD-10-CM | POA: Diagnosis not present

## 2015-09-25 DIAGNOSIS — I129 Hypertensive chronic kidney disease with stage 1 through stage 4 chronic kidney disease, or unspecified chronic kidney disease: Secondary | ICD-10-CM | POA: Diagnosis not present

## 2015-09-25 DIAGNOSIS — S82122D Displaced fracture of lateral condyle of left tibia, subsequent encounter for closed fracture with routine healing: Secondary | ICD-10-CM | POA: Diagnosis not present

## 2015-09-25 DIAGNOSIS — M4804 Spinal stenosis, thoracic region: Secondary | ICD-10-CM | POA: Diagnosis not present

## 2015-09-25 DIAGNOSIS — E1122 Type 2 diabetes mellitus with diabetic chronic kidney disease: Secondary | ICD-10-CM | POA: Diagnosis not present

## 2015-09-25 DIAGNOSIS — E1142 Type 2 diabetes mellitus with diabetic polyneuropathy: Secondary | ICD-10-CM | POA: Diagnosis not present

## 2015-09-25 DIAGNOSIS — J45998 Other asthma: Secondary | ICD-10-CM | POA: Diagnosis not present

## 2015-09-25 DIAGNOSIS — E11610 Type 2 diabetes mellitus with diabetic neuropathic arthropathy: Secondary | ICD-10-CM | POA: Diagnosis not present

## 2015-09-25 DIAGNOSIS — M961 Postlaminectomy syndrome, not elsewhere classified: Secondary | ICD-10-CM | POA: Diagnosis not present

## 2015-09-25 DIAGNOSIS — E785 Hyperlipidemia, unspecified: Secondary | ICD-10-CM | POA: Diagnosis not present

## 2015-09-25 DIAGNOSIS — N183 Chronic kidney disease, stage 3 (moderate): Secondary | ICD-10-CM | POA: Diagnosis not present

## 2015-09-26 ENCOUNTER — Ambulatory Visit: Payer: Medicare Other | Admitting: Family Medicine

## 2015-09-26 ENCOUNTER — Telehealth: Payer: Self-pay

## 2015-09-26 DIAGNOSIS — J45998 Other asthma: Secondary | ICD-10-CM | POA: Diagnosis not present

## 2015-09-26 DIAGNOSIS — S82122D Displaced fracture of lateral condyle of left tibia, subsequent encounter for closed fracture with routine healing: Secondary | ICD-10-CM | POA: Diagnosis not present

## 2015-09-26 DIAGNOSIS — G8929 Other chronic pain: Secondary | ICD-10-CM | POA: Diagnosis not present

## 2015-09-26 DIAGNOSIS — M961 Postlaminectomy syndrome, not elsewhere classified: Secondary | ICD-10-CM | POA: Diagnosis not present

## 2015-09-26 DIAGNOSIS — E1142 Type 2 diabetes mellitus with diabetic polyneuropathy: Secondary | ICD-10-CM | POA: Diagnosis not present

## 2015-09-26 DIAGNOSIS — E785 Hyperlipidemia, unspecified: Secondary | ICD-10-CM | POA: Diagnosis not present

## 2015-09-26 DIAGNOSIS — M4804 Spinal stenosis, thoracic region: Secondary | ICD-10-CM | POA: Diagnosis not present

## 2015-09-26 DIAGNOSIS — E1122 Type 2 diabetes mellitus with diabetic chronic kidney disease: Secondary | ICD-10-CM | POA: Diagnosis not present

## 2015-09-26 DIAGNOSIS — N183 Chronic kidney disease, stage 3 (moderate): Secondary | ICD-10-CM | POA: Diagnosis not present

## 2015-09-26 DIAGNOSIS — I129 Hypertensive chronic kidney disease with stage 1 through stage 4 chronic kidney disease, or unspecified chronic kidney disease: Secondary | ICD-10-CM | POA: Diagnosis not present

## 2015-09-26 DIAGNOSIS — E11610 Type 2 diabetes mellitus with diabetic neuropathic arthropathy: Secondary | ICD-10-CM | POA: Diagnosis not present

## 2015-09-26 NOTE — Telephone Encounter (Signed)
Records from [facility] placed in your folder for review. [name]

## 2015-09-27 DIAGNOSIS — I129 Hypertensive chronic kidney disease with stage 1 through stage 4 chronic kidney disease, or unspecified chronic kidney disease: Secondary | ICD-10-CM | POA: Diagnosis not present

## 2015-09-27 DIAGNOSIS — E785 Hyperlipidemia, unspecified: Secondary | ICD-10-CM | POA: Diagnosis not present

## 2015-09-27 DIAGNOSIS — S82122D Displaced fracture of lateral condyle of left tibia, subsequent encounter for closed fracture with routine healing: Secondary | ICD-10-CM | POA: Diagnosis not present

## 2015-09-27 DIAGNOSIS — M4804 Spinal stenosis, thoracic region: Secondary | ICD-10-CM | POA: Diagnosis not present

## 2015-09-27 DIAGNOSIS — E1142 Type 2 diabetes mellitus with diabetic polyneuropathy: Secondary | ICD-10-CM | POA: Diagnosis not present

## 2015-09-27 DIAGNOSIS — J45998 Other asthma: Secondary | ICD-10-CM | POA: Diagnosis not present

## 2015-09-27 DIAGNOSIS — N183 Chronic kidney disease, stage 3 (moderate): Secondary | ICD-10-CM | POA: Diagnosis not present

## 2015-09-27 DIAGNOSIS — G8929 Other chronic pain: Secondary | ICD-10-CM | POA: Diagnosis not present

## 2015-09-27 DIAGNOSIS — M961 Postlaminectomy syndrome, not elsewhere classified: Secondary | ICD-10-CM | POA: Diagnosis not present

## 2015-09-27 DIAGNOSIS — E1122 Type 2 diabetes mellitus with diabetic chronic kidney disease: Secondary | ICD-10-CM | POA: Diagnosis not present

## 2015-09-27 DIAGNOSIS — E11610 Type 2 diabetes mellitus with diabetic neuropathic arthropathy: Secondary | ICD-10-CM | POA: Diagnosis not present

## 2015-09-28 DIAGNOSIS — S82122D Displaced fracture of lateral condyle of left tibia, subsequent encounter for closed fracture with routine healing: Secondary | ICD-10-CM | POA: Diagnosis not present

## 2015-09-28 DIAGNOSIS — E1122 Type 2 diabetes mellitus with diabetic chronic kidney disease: Secondary | ICD-10-CM | POA: Diagnosis not present

## 2015-09-28 DIAGNOSIS — E1142 Type 2 diabetes mellitus with diabetic polyneuropathy: Secondary | ICD-10-CM | POA: Diagnosis not present

## 2015-09-28 DIAGNOSIS — I129 Hypertensive chronic kidney disease with stage 1 through stage 4 chronic kidney disease, or unspecified chronic kidney disease: Secondary | ICD-10-CM | POA: Diagnosis not present

## 2015-09-28 DIAGNOSIS — E785 Hyperlipidemia, unspecified: Secondary | ICD-10-CM | POA: Diagnosis not present

## 2015-09-28 DIAGNOSIS — N183 Chronic kidney disease, stage 3 (moderate): Secondary | ICD-10-CM | POA: Diagnosis not present

## 2015-09-28 DIAGNOSIS — G8929 Other chronic pain: Secondary | ICD-10-CM | POA: Diagnosis not present

## 2015-09-28 DIAGNOSIS — M961 Postlaminectomy syndrome, not elsewhere classified: Secondary | ICD-10-CM | POA: Diagnosis not present

## 2015-09-28 DIAGNOSIS — E11610 Type 2 diabetes mellitus with diabetic neuropathic arthropathy: Secondary | ICD-10-CM | POA: Diagnosis not present

## 2015-09-28 DIAGNOSIS — J45998 Other asthma: Secondary | ICD-10-CM | POA: Diagnosis not present

## 2015-09-28 DIAGNOSIS — M4804 Spinal stenosis, thoracic region: Secondary | ICD-10-CM | POA: Diagnosis not present

## 2015-09-29 DIAGNOSIS — M6281 Muscle weakness (generalized): Secondary | ICD-10-CM | POA: Diagnosis not present

## 2015-09-29 DIAGNOSIS — E785 Hyperlipidemia, unspecified: Secondary | ICD-10-CM | POA: Diagnosis not present

## 2015-09-29 DIAGNOSIS — I129 Hypertensive chronic kidney disease with stage 1 through stage 4 chronic kidney disease, or unspecified chronic kidney disease: Secondary | ICD-10-CM | POA: Diagnosis not present

## 2015-09-29 DIAGNOSIS — M4804 Spinal stenosis, thoracic region: Secondary | ICD-10-CM | POA: Diagnosis not present

## 2015-09-29 DIAGNOSIS — E1122 Type 2 diabetes mellitus with diabetic chronic kidney disease: Secondary | ICD-10-CM | POA: Diagnosis not present

## 2015-09-29 DIAGNOSIS — M25362 Other instability, left knee: Secondary | ICD-10-CM | POA: Diagnosis not present

## 2015-09-29 DIAGNOSIS — E11610 Type 2 diabetes mellitus with diabetic neuropathic arthropathy: Secondary | ICD-10-CM | POA: Diagnosis not present

## 2015-09-29 DIAGNOSIS — M961 Postlaminectomy syndrome, not elsewhere classified: Secondary | ICD-10-CM | POA: Diagnosis not present

## 2015-09-29 DIAGNOSIS — E1142 Type 2 diabetes mellitus with diabetic polyneuropathy: Secondary | ICD-10-CM | POA: Diagnosis not present

## 2015-09-29 DIAGNOSIS — J45998 Other asthma: Secondary | ICD-10-CM | POA: Diagnosis not present

## 2015-09-29 DIAGNOSIS — N183 Chronic kidney disease, stage 3 (moderate): Secondary | ICD-10-CM | POA: Diagnosis not present

## 2015-09-29 DIAGNOSIS — G8929 Other chronic pain: Secondary | ICD-10-CM | POA: Diagnosis not present

## 2015-09-29 DIAGNOSIS — S82122D Displaced fracture of lateral condyle of left tibia, subsequent encounter for closed fracture with routine healing: Secondary | ICD-10-CM | POA: Diagnosis not present

## 2015-10-02 DIAGNOSIS — N183 Chronic kidney disease, stage 3 (moderate): Secondary | ICD-10-CM | POA: Diagnosis not present

## 2015-10-02 DIAGNOSIS — I129 Hypertensive chronic kidney disease with stage 1 through stage 4 chronic kidney disease, or unspecified chronic kidney disease: Secondary | ICD-10-CM | POA: Diagnosis not present

## 2015-10-02 DIAGNOSIS — M961 Postlaminectomy syndrome, not elsewhere classified: Secondary | ICD-10-CM | POA: Diagnosis not present

## 2015-10-02 DIAGNOSIS — E1122 Type 2 diabetes mellitus with diabetic chronic kidney disease: Secondary | ICD-10-CM | POA: Diagnosis not present

## 2015-10-02 DIAGNOSIS — G8929 Other chronic pain: Secondary | ICD-10-CM | POA: Diagnosis not present

## 2015-10-02 DIAGNOSIS — E1142 Type 2 diabetes mellitus with diabetic polyneuropathy: Secondary | ICD-10-CM | POA: Diagnosis not present

## 2015-10-02 DIAGNOSIS — E11610 Type 2 diabetes mellitus with diabetic neuropathic arthropathy: Secondary | ICD-10-CM | POA: Diagnosis not present

## 2015-10-02 DIAGNOSIS — J45998 Other asthma: Secondary | ICD-10-CM | POA: Diagnosis not present

## 2015-10-02 DIAGNOSIS — S82122D Displaced fracture of lateral condyle of left tibia, subsequent encounter for closed fracture with routine healing: Secondary | ICD-10-CM | POA: Diagnosis not present

## 2015-10-02 DIAGNOSIS — M4804 Spinal stenosis, thoracic region: Secondary | ICD-10-CM | POA: Diagnosis not present

## 2015-10-02 DIAGNOSIS — E785 Hyperlipidemia, unspecified: Secondary | ICD-10-CM | POA: Diagnosis not present

## 2015-10-03 DIAGNOSIS — E1122 Type 2 diabetes mellitus with diabetic chronic kidney disease: Secondary | ICD-10-CM | POA: Diagnosis not present

## 2015-10-03 DIAGNOSIS — I129 Hypertensive chronic kidney disease with stage 1 through stage 4 chronic kidney disease, or unspecified chronic kidney disease: Secondary | ICD-10-CM | POA: Diagnosis not present

## 2015-10-03 DIAGNOSIS — E1142 Type 2 diabetes mellitus with diabetic polyneuropathy: Secondary | ICD-10-CM | POA: Diagnosis not present

## 2015-10-03 DIAGNOSIS — G8929 Other chronic pain: Secondary | ICD-10-CM | POA: Diagnosis not present

## 2015-10-03 DIAGNOSIS — E785 Hyperlipidemia, unspecified: Secondary | ICD-10-CM | POA: Diagnosis not present

## 2015-10-03 DIAGNOSIS — J45998 Other asthma: Secondary | ICD-10-CM | POA: Diagnosis not present

## 2015-10-03 DIAGNOSIS — E11610 Type 2 diabetes mellitus with diabetic neuropathic arthropathy: Secondary | ICD-10-CM | POA: Diagnosis not present

## 2015-10-03 DIAGNOSIS — M961 Postlaminectomy syndrome, not elsewhere classified: Secondary | ICD-10-CM | POA: Diagnosis not present

## 2015-10-03 DIAGNOSIS — M4804 Spinal stenosis, thoracic region: Secondary | ICD-10-CM | POA: Diagnosis not present

## 2015-10-03 DIAGNOSIS — N183 Chronic kidney disease, stage 3 (moderate): Secondary | ICD-10-CM | POA: Diagnosis not present

## 2015-10-03 DIAGNOSIS — S82122D Displaced fracture of lateral condyle of left tibia, subsequent encounter for closed fracture with routine healing: Secondary | ICD-10-CM | POA: Diagnosis not present

## 2015-10-04 DIAGNOSIS — E1142 Type 2 diabetes mellitus with diabetic polyneuropathy: Secondary | ICD-10-CM | POA: Diagnosis not present

## 2015-10-04 DIAGNOSIS — M6281 Muscle weakness (generalized): Secondary | ICD-10-CM | POA: Diagnosis not present

## 2015-10-04 DIAGNOSIS — G8929 Other chronic pain: Secondary | ICD-10-CM | POA: Diagnosis not present

## 2015-10-04 DIAGNOSIS — S82122D Displaced fracture of lateral condyle of left tibia, subsequent encounter for closed fracture with routine healing: Secondary | ICD-10-CM | POA: Diagnosis not present

## 2015-10-04 DIAGNOSIS — E1122 Type 2 diabetes mellitus with diabetic chronic kidney disease: Secondary | ICD-10-CM | POA: Diagnosis not present

## 2015-10-04 DIAGNOSIS — N183 Chronic kidney disease, stage 3 (moderate): Secondary | ICD-10-CM | POA: Diagnosis not present

## 2015-10-04 DIAGNOSIS — M4804 Spinal stenosis, thoracic region: Secondary | ICD-10-CM | POA: Diagnosis not present

## 2015-10-04 DIAGNOSIS — G6289 Other specified polyneuropathies: Secondary | ICD-10-CM | POA: Diagnosis not present

## 2015-10-04 DIAGNOSIS — E785 Hyperlipidemia, unspecified: Secondary | ICD-10-CM | POA: Diagnosis not present

## 2015-10-04 DIAGNOSIS — I129 Hypertensive chronic kidney disease with stage 1 through stage 4 chronic kidney disease, or unspecified chronic kidney disease: Secondary | ICD-10-CM | POA: Diagnosis not present

## 2015-10-04 DIAGNOSIS — M961 Postlaminectomy syndrome, not elsewhere classified: Secondary | ICD-10-CM | POA: Diagnosis not present

## 2015-10-04 DIAGNOSIS — J45998 Other asthma: Secondary | ICD-10-CM | POA: Diagnosis not present

## 2015-10-04 DIAGNOSIS — E11610 Type 2 diabetes mellitus with diabetic neuropathic arthropathy: Secondary | ICD-10-CM | POA: Diagnosis not present

## 2015-10-05 DIAGNOSIS — E785 Hyperlipidemia, unspecified: Secondary | ICD-10-CM | POA: Diagnosis not present

## 2015-10-05 DIAGNOSIS — S82122D Displaced fracture of lateral condyle of left tibia, subsequent encounter for closed fracture with routine healing: Secondary | ICD-10-CM | POA: Diagnosis not present

## 2015-10-05 DIAGNOSIS — N183 Chronic kidney disease, stage 3 (moderate): Secondary | ICD-10-CM | POA: Diagnosis not present

## 2015-10-05 DIAGNOSIS — G8929 Other chronic pain: Secondary | ICD-10-CM | POA: Diagnosis not present

## 2015-10-05 DIAGNOSIS — M4804 Spinal stenosis, thoracic region: Secondary | ICD-10-CM | POA: Diagnosis not present

## 2015-10-05 DIAGNOSIS — M961 Postlaminectomy syndrome, not elsewhere classified: Secondary | ICD-10-CM | POA: Diagnosis not present

## 2015-10-05 DIAGNOSIS — E11610 Type 2 diabetes mellitus with diabetic neuropathic arthropathy: Secondary | ICD-10-CM | POA: Diagnosis not present

## 2015-10-05 DIAGNOSIS — E1122 Type 2 diabetes mellitus with diabetic chronic kidney disease: Secondary | ICD-10-CM | POA: Diagnosis not present

## 2015-10-05 DIAGNOSIS — I129 Hypertensive chronic kidney disease with stage 1 through stage 4 chronic kidney disease, or unspecified chronic kidney disease: Secondary | ICD-10-CM | POA: Diagnosis not present

## 2015-10-05 DIAGNOSIS — J45998 Other asthma: Secondary | ICD-10-CM | POA: Diagnosis not present

## 2015-10-05 DIAGNOSIS — E1142 Type 2 diabetes mellitus with diabetic polyneuropathy: Secondary | ICD-10-CM | POA: Diagnosis not present

## 2015-10-06 DIAGNOSIS — M4804 Spinal stenosis, thoracic region: Secondary | ICD-10-CM | POA: Diagnosis not present

## 2015-10-06 DIAGNOSIS — N183 Chronic kidney disease, stage 3 (moderate): Secondary | ICD-10-CM | POA: Diagnosis not present

## 2015-10-06 DIAGNOSIS — M961 Postlaminectomy syndrome, not elsewhere classified: Secondary | ICD-10-CM | POA: Diagnosis not present

## 2015-10-06 DIAGNOSIS — E11610 Type 2 diabetes mellitus with diabetic neuropathic arthropathy: Secondary | ICD-10-CM | POA: Diagnosis not present

## 2015-10-06 DIAGNOSIS — E1122 Type 2 diabetes mellitus with diabetic chronic kidney disease: Secondary | ICD-10-CM | POA: Diagnosis not present

## 2015-10-06 DIAGNOSIS — E1142 Type 2 diabetes mellitus with diabetic polyneuropathy: Secondary | ICD-10-CM | POA: Diagnosis not present

## 2015-10-06 DIAGNOSIS — S82122D Displaced fracture of lateral condyle of left tibia, subsequent encounter for closed fracture with routine healing: Secondary | ICD-10-CM | POA: Diagnosis not present

## 2015-10-06 DIAGNOSIS — I129 Hypertensive chronic kidney disease with stage 1 through stage 4 chronic kidney disease, or unspecified chronic kidney disease: Secondary | ICD-10-CM | POA: Diagnosis not present

## 2015-10-06 DIAGNOSIS — G8929 Other chronic pain: Secondary | ICD-10-CM | POA: Diagnosis not present

## 2015-10-06 DIAGNOSIS — J45998 Other asthma: Secondary | ICD-10-CM | POA: Diagnosis not present

## 2015-10-06 DIAGNOSIS — E785 Hyperlipidemia, unspecified: Secondary | ICD-10-CM | POA: Diagnosis not present

## 2015-10-10 ENCOUNTER — Other Ambulatory Visit: Payer: Self-pay | Admitting: Family Medicine

## 2015-10-10 DIAGNOSIS — M6281 Muscle weakness (generalized): Secondary | ICD-10-CM | POA: Diagnosis not present

## 2015-10-10 DIAGNOSIS — Z7982 Long term (current) use of aspirin: Secondary | ICD-10-CM | POA: Diagnosis not present

## 2015-10-10 DIAGNOSIS — Z794 Long term (current) use of insulin: Secondary | ICD-10-CM | POA: Diagnosis not present

## 2015-10-10 DIAGNOSIS — E1122 Type 2 diabetes mellitus with diabetic chronic kidney disease: Secondary | ICD-10-CM | POA: Diagnosis not present

## 2015-10-10 DIAGNOSIS — Z9181 History of falling: Secondary | ICD-10-CM | POA: Diagnosis not present

## 2015-10-10 DIAGNOSIS — S82122D Displaced fracture of lateral condyle of left tibia, subsequent encounter for closed fracture with routine healing: Secondary | ICD-10-CM | POA: Diagnosis not present

## 2015-10-10 DIAGNOSIS — M961 Postlaminectomy syndrome, not elsewhere classified: Secondary | ICD-10-CM | POA: Diagnosis not present

## 2015-10-10 DIAGNOSIS — W19XXXD Unspecified fall, subsequent encounter: Secondary | ICD-10-CM | POA: Diagnosis not present

## 2015-10-10 DIAGNOSIS — J45909 Unspecified asthma, uncomplicated: Secondary | ICD-10-CM | POA: Diagnosis not present

## 2015-10-10 DIAGNOSIS — N183 Chronic kidney disease, stage 3 (moderate): Secondary | ICD-10-CM | POA: Diagnosis not present

## 2015-10-10 DIAGNOSIS — E11610 Type 2 diabetes mellitus with diabetic neuropathic arthropathy: Secondary | ICD-10-CM | POA: Diagnosis not present

## 2015-10-10 NOTE — Telephone Encounter (Signed)
This ok to refill. 

## 2015-10-11 ENCOUNTER — Other Ambulatory Visit: Payer: Self-pay | Admitting: Family Medicine

## 2015-10-11 NOTE — Telephone Encounter (Signed)
Ok to refill 

## 2015-10-11 NOTE — Telephone Encounter (Signed)
Per vickie- ok to refill for 90 days

## 2015-10-12 DIAGNOSIS — M25362 Other instability, left knee: Secondary | ICD-10-CM | POA: Diagnosis not present

## 2015-10-12 DIAGNOSIS — M25562 Pain in left knee: Secondary | ICD-10-CM | POA: Diagnosis not present

## 2015-10-13 DIAGNOSIS — E11610 Type 2 diabetes mellitus with diabetic neuropathic arthropathy: Secondary | ICD-10-CM | POA: Diagnosis not present

## 2015-10-13 DIAGNOSIS — W19XXXD Unspecified fall, subsequent encounter: Secondary | ICD-10-CM | POA: Diagnosis not present

## 2015-10-13 DIAGNOSIS — Z9181 History of falling: Secondary | ICD-10-CM | POA: Diagnosis not present

## 2015-10-13 DIAGNOSIS — Z794 Long term (current) use of insulin: Secondary | ICD-10-CM | POA: Diagnosis not present

## 2015-10-13 DIAGNOSIS — Z7982 Long term (current) use of aspirin: Secondary | ICD-10-CM | POA: Diagnosis not present

## 2015-10-13 DIAGNOSIS — E1122 Type 2 diabetes mellitus with diabetic chronic kidney disease: Secondary | ICD-10-CM | POA: Diagnosis not present

## 2015-10-13 DIAGNOSIS — S82122D Displaced fracture of lateral condyle of left tibia, subsequent encounter for closed fracture with routine healing: Secondary | ICD-10-CM | POA: Diagnosis not present

## 2015-10-13 DIAGNOSIS — N183 Chronic kidney disease, stage 3 (moderate): Secondary | ICD-10-CM | POA: Diagnosis not present

## 2015-10-13 DIAGNOSIS — M961 Postlaminectomy syndrome, not elsewhere classified: Secondary | ICD-10-CM | POA: Diagnosis not present

## 2015-10-13 DIAGNOSIS — M6281 Muscle weakness (generalized): Secondary | ICD-10-CM | POA: Diagnosis not present

## 2015-10-13 DIAGNOSIS — J45909 Unspecified asthma, uncomplicated: Secondary | ICD-10-CM | POA: Diagnosis not present

## 2015-10-17 DIAGNOSIS — Z9181 History of falling: Secondary | ICD-10-CM | POA: Diagnosis not present

## 2015-10-17 DIAGNOSIS — S82122D Displaced fracture of lateral condyle of left tibia, subsequent encounter for closed fracture with routine healing: Secondary | ICD-10-CM | POA: Diagnosis not present

## 2015-10-17 DIAGNOSIS — M6281 Muscle weakness (generalized): Secondary | ICD-10-CM | POA: Diagnosis not present

## 2015-10-17 DIAGNOSIS — Z7982 Long term (current) use of aspirin: Secondary | ICD-10-CM | POA: Diagnosis not present

## 2015-10-17 DIAGNOSIS — E11610 Type 2 diabetes mellitus with diabetic neuropathic arthropathy: Secondary | ICD-10-CM | POA: Diagnosis not present

## 2015-10-17 DIAGNOSIS — Z794 Long term (current) use of insulin: Secondary | ICD-10-CM | POA: Diagnosis not present

## 2015-10-17 DIAGNOSIS — W19XXXD Unspecified fall, subsequent encounter: Secondary | ICD-10-CM | POA: Diagnosis not present

## 2015-10-17 DIAGNOSIS — E1122 Type 2 diabetes mellitus with diabetic chronic kidney disease: Secondary | ICD-10-CM | POA: Diagnosis not present

## 2015-10-17 DIAGNOSIS — M961 Postlaminectomy syndrome, not elsewhere classified: Secondary | ICD-10-CM | POA: Diagnosis not present

## 2015-10-17 DIAGNOSIS — N183 Chronic kidney disease, stage 3 (moderate): Secondary | ICD-10-CM | POA: Diagnosis not present

## 2015-10-17 DIAGNOSIS — J45909 Unspecified asthma, uncomplicated: Secondary | ICD-10-CM | POA: Diagnosis not present

## 2015-10-19 DIAGNOSIS — J45909 Unspecified asthma, uncomplicated: Secondary | ICD-10-CM | POA: Diagnosis not present

## 2015-10-19 DIAGNOSIS — E11610 Type 2 diabetes mellitus with diabetic neuropathic arthropathy: Secondary | ICD-10-CM | POA: Diagnosis not present

## 2015-10-19 DIAGNOSIS — M961 Postlaminectomy syndrome, not elsewhere classified: Secondary | ICD-10-CM | POA: Diagnosis not present

## 2015-10-19 DIAGNOSIS — Z7982 Long term (current) use of aspirin: Secondary | ICD-10-CM | POA: Diagnosis not present

## 2015-10-19 DIAGNOSIS — W19XXXD Unspecified fall, subsequent encounter: Secondary | ICD-10-CM | POA: Diagnosis not present

## 2015-10-19 DIAGNOSIS — S82122D Displaced fracture of lateral condyle of left tibia, subsequent encounter for closed fracture with routine healing: Secondary | ICD-10-CM | POA: Diagnosis not present

## 2015-10-19 DIAGNOSIS — N183 Chronic kidney disease, stage 3 (moderate): Secondary | ICD-10-CM | POA: Diagnosis not present

## 2015-10-19 DIAGNOSIS — Z794 Long term (current) use of insulin: Secondary | ICD-10-CM | POA: Diagnosis not present

## 2015-10-19 DIAGNOSIS — E1122 Type 2 diabetes mellitus with diabetic chronic kidney disease: Secondary | ICD-10-CM | POA: Diagnosis not present

## 2015-10-19 DIAGNOSIS — Z9181 History of falling: Secondary | ICD-10-CM | POA: Diagnosis not present

## 2015-10-19 DIAGNOSIS — M6281 Muscle weakness (generalized): Secondary | ICD-10-CM | POA: Diagnosis not present

## 2015-10-20 DIAGNOSIS — M6281 Muscle weakness (generalized): Secondary | ICD-10-CM | POA: Diagnosis not present

## 2015-10-20 DIAGNOSIS — M25362 Other instability, left knee: Secondary | ICD-10-CM | POA: Diagnosis not present

## 2015-10-23 DIAGNOSIS — I10 Essential (primary) hypertension: Secondary | ICD-10-CM | POA: Diagnosis not present

## 2015-10-23 DIAGNOSIS — M4804 Spinal stenosis, thoracic region: Secondary | ICD-10-CM | POA: Diagnosis not present

## 2015-10-24 ENCOUNTER — Other Ambulatory Visit: Payer: Self-pay | Admitting: Neurosurgery

## 2015-10-24 DIAGNOSIS — Z794 Long term (current) use of insulin: Secondary | ICD-10-CM | POA: Diagnosis not present

## 2015-10-24 DIAGNOSIS — M961 Postlaminectomy syndrome, not elsewhere classified: Secondary | ICD-10-CM | POA: Diagnosis not present

## 2015-10-24 DIAGNOSIS — J45909 Unspecified asthma, uncomplicated: Secondary | ICD-10-CM | POA: Diagnosis not present

## 2015-10-24 DIAGNOSIS — E11610 Type 2 diabetes mellitus with diabetic neuropathic arthropathy: Secondary | ICD-10-CM | POA: Diagnosis not present

## 2015-10-24 DIAGNOSIS — M4804 Spinal stenosis, thoracic region: Secondary | ICD-10-CM

## 2015-10-24 DIAGNOSIS — M6281 Muscle weakness (generalized): Secondary | ICD-10-CM | POA: Diagnosis not present

## 2015-10-24 DIAGNOSIS — N183 Chronic kidney disease, stage 3 (moderate): Secondary | ICD-10-CM | POA: Diagnosis not present

## 2015-10-24 DIAGNOSIS — E1122 Type 2 diabetes mellitus with diabetic chronic kidney disease: Secondary | ICD-10-CM | POA: Diagnosis not present

## 2015-10-24 DIAGNOSIS — W19XXXD Unspecified fall, subsequent encounter: Secondary | ICD-10-CM | POA: Diagnosis not present

## 2015-10-24 DIAGNOSIS — S82122D Displaced fracture of lateral condyle of left tibia, subsequent encounter for closed fracture with routine healing: Secondary | ICD-10-CM | POA: Diagnosis not present

## 2015-10-24 DIAGNOSIS — Z9181 History of falling: Secondary | ICD-10-CM | POA: Diagnosis not present

## 2015-10-24 DIAGNOSIS — Z7982 Long term (current) use of aspirin: Secondary | ICD-10-CM | POA: Diagnosis not present

## 2015-10-24 DIAGNOSIS — M545 Low back pain: Secondary | ICD-10-CM

## 2015-10-25 ENCOUNTER — Encounter: Payer: Self-pay | Admitting: Family Medicine

## 2015-10-25 ENCOUNTER — Ambulatory Visit (INDEPENDENT_AMBULATORY_CARE_PROVIDER_SITE_OTHER): Payer: Medicare Other | Admitting: Family Medicine

## 2015-10-25 VITALS — BP 128/82 | HR 68 | Wt 236.8 lb

## 2015-10-25 DIAGNOSIS — Z1239 Encounter for other screening for malignant neoplasm of breast: Secondary | ICD-10-CM | POA: Diagnosis not present

## 2015-10-25 DIAGNOSIS — Z1211 Encounter for screening for malignant neoplasm of colon: Secondary | ICD-10-CM | POA: Diagnosis not present

## 2015-10-25 DIAGNOSIS — Z0000 Encounter for general adult medical examination without abnormal findings: Secondary | ICD-10-CM

## 2015-10-25 DIAGNOSIS — R29898 Other symptoms and signs involving the musculoskeletal system: Secondary | ICD-10-CM

## 2015-10-25 DIAGNOSIS — R945 Abnormal results of liver function studies: Secondary | ICD-10-CM

## 2015-10-25 DIAGNOSIS — I10 Essential (primary) hypertension: Secondary | ICD-10-CM | POA: Diagnosis not present

## 2015-10-25 DIAGNOSIS — E559 Vitamin D deficiency, unspecified: Secondary | ICD-10-CM | POA: Diagnosis not present

## 2015-10-25 DIAGNOSIS — R7989 Other specified abnormal findings of blood chemistry: Secondary | ICD-10-CM

## 2015-10-25 DIAGNOSIS — Z794 Long term (current) use of insulin: Secondary | ICD-10-CM

## 2015-10-25 DIAGNOSIS — E2839 Other primary ovarian failure: Secondary | ICD-10-CM | POA: Diagnosis not present

## 2015-10-25 DIAGNOSIS — E1140 Type 2 diabetes mellitus with diabetic neuropathy, unspecified: Secondary | ICD-10-CM | POA: Diagnosis not present

## 2015-10-25 LAB — CBC WITH DIFFERENTIAL/PLATELET
BASOS PCT: 0 %
Basophils Absolute: 0 cells/uL (ref 0–200)
EOS PCT: 3 %
Eosinophils Absolute: 183 cells/uL (ref 15–500)
HEMATOCRIT: 41.1 % (ref 35.0–45.0)
HEMOGLOBIN: 13.1 g/dL (ref 11.7–15.5)
LYMPHS ABS: 2623 {cells}/uL (ref 850–3900)
Lymphocytes Relative: 43 %
MCH: 27.5 pg (ref 27.0–33.0)
MCHC: 31.9 g/dL — AB (ref 32.0–36.0)
MCV: 86.3 fL (ref 80.0–100.0)
MONO ABS: 610 {cells}/uL (ref 200–950)
MPV: 12.5 fL (ref 7.5–12.5)
Monocytes Relative: 10 %
NEUTROS PCT: 44 %
Neutro Abs: 2684 cells/uL (ref 1500–7800)
Platelets: 309 10*3/uL (ref 140–400)
RBC: 4.76 MIL/uL (ref 3.80–5.10)
RDW: 13.3 % (ref 11.0–15.0)
WBC: 6.1 10*3/uL (ref 4.0–10.5)

## 2015-10-25 LAB — LIPID PANEL
CHOL/HDL RATIO: 2.7 ratio (ref <=5.0)
Cholesterol: 179 mg/dL (ref 125–200)
HDL: 67 mg/dL (ref >=46)
LDL CALC: 92 mg/dL (ref <130)
TRIGLYCERIDES: 102 mg/dL (ref <150)
VLDL: 20 mg/dL (ref <30)

## 2015-10-25 LAB — COMPLETE METABOLIC PANEL WITH GFR
ALBUMIN: 3.7 g/dL (ref 3.6–5.1)
ALK PHOS: 79 U/L (ref 33–130)
ALT: 57 U/L — AB (ref 6–29)
AST: 55 U/L — AB (ref 10–35)
BUN: 12 mg/dL (ref 7–25)
CALCIUM: 9.2 mg/dL (ref 8.6–10.4)
CO2: 25 mmol/L (ref 20–31)
CREATININE: 0.92 mg/dL (ref 0.50–0.99)
Chloride: 105 mmol/L (ref 98–110)
GFR, Est African American: 75 mL/min (ref >=60)
GFR, Est Non African American: 65 mL/min (ref >=60)
GLUCOSE: 154 mg/dL — AB (ref 65–99)
POTASSIUM: 4.8 mmol/L (ref 3.5–5.3)
SODIUM: 139 mmol/L (ref 135–146)
TOTAL PROTEIN: 6.7 g/dL (ref 6.1–8.1)
Total Bilirubin: 0.5 mg/dL (ref 0.2–1.2)

## 2015-10-25 LAB — POCT GLYCOSYLATED HEMOGLOBIN (HGB A1C): Hemoglobin A1C: 8.1

## 2015-10-25 LAB — TSH: TSH: 2.22 mIU/L

## 2015-10-25 LAB — VITAMIN B12: Vitamin B-12: 478 pg/mL (ref 200–1100)

## 2015-10-25 NOTE — Progress Notes (Signed)
Cassandra Stewart is a 65 y.o. female who presents for annual wellness visit and follow-up on chronic medical conditions.  She has the following concerns: Left foot and leg swelling daily but it goes down in the evenings after elevating it.  Sitting in wheelchair most of the days. States her leg is not swollen today.   She saw the neurosurgeon 2 days ago and states the plan is to repeat a MRI on her back due to the fact she is not progressing as they expected. She is not able to stand and walk as well as they would like   Continues to have left leg weakness.  She is still getting PT at home. Has been standing with a walker at home. Walks with walker from room to room with assistance. Going to the bathroom, transfering herself on and off toilet.  States she feels like her left knee is going to "collapse". Feels like knee cap is going to slip. Had a MRI of left knee this past week and saw Dr. Veda Canning, orthopedist and MRI of knee was negative per patient.   Has been checking blood sugars twice daily. Blood sugar readings between 155-192 fasting. Has been checking evening blood sugars after eating. States she decides how much insulin to take based on the carbs she is eating. Denies any low blood sugar readings Eating 2 meals per day. She is happy that she has lost weight. States she is trying to eat healthier.  Lives alone. Daughter lives nearby.  Stopped having periods approximately 15 years ago. Never had a bone density.   There is no immunization history on file for this patient.  She is refusing all immunizations today.   Last Pap smear:   Years ago- denies having an abnormal one. Refuses this today.   Last mammogram: 2 years ago Last colonoscopy: never Last DEXA: never Dentist: years ago- no dentures.  Ophtho: had eye surgery last year 2016, had eye surgery Exercise: leg strengthening excerises  DECLINES FLU SHOT TODAY   Other doctors caring for patient include: Dr. Franky Macho- back  surgeon Orthopedist- Dr. Linna Caprice at [facility]opedist.  [facility]- Dr. Ella Jubilee     Depression screen:  See questionnaire below.  Depression screen PHQ 2/9 10/25/2015  Decreased Interest 0  Down, Depressed, Hopeless 0  PHQ - 2 Score 0    Fall Risk Screen: see questionnaire below. Fall Risk  10/25/2015  Falls in the past year? No    ADL screen:  See questionnaire below Functional Status Survey: Is the patient deaf or have difficulty hearing?: No Does the patient have difficulty seeing, even when wearing glasses/contacts?: No Does the patient have difficulty concentrating, remembering, or making decisions?: No Does the patient have difficulty walking or climbing stairs?: Yes (in wheelchair) Does the patient have difficulty dressing or bathing?: No Does the patient have difficulty doing errands alone such as visiting a doctor's office or shopping?: No   End of Life Discussion:  Patient does not have a living will and medical power of attorney. Documents reviewed and given to patient.   Review of Systems Constitutional: -fever, -chills, -sweats, -unexpected weight change, -anorexia, -fatigue Allergy: -sneezing, -itching, -congestion Dermatology: denies changing moles, rash, lumps, new worrisome lesions ENT: -runny nose, -ear pain, -sore throat, -hoarseness, -sinus pain, -teeth pain, -tinnitus, -hearing loss, -epistaxis Cardiology:  -chest pain, -palpitations, -edema, -orthopnea, -paroxysmal nocturnal dyspnea Respiratory: -cough, -shortness of breath, -dyspnea on exertion, -wheezing, -hemoptysis Gastroenterology: -abdominal pain, -nausea, -vomiting, -diarrhea, -constipation, -blood in stool, -changes  in bowel movement, -dysphagia Hematology: -bleeding or bruising problems Musculoskeletal: -arthralgias, -myalgias, -joint swelling, +back pain, -neck pain Ophthalmology: -vision changes, -eye redness, -itching, -discharge Urology: -dysuria, -difficulty urinating, -hematuria, -urinary  frequency, -urgency, incontinence Neurology: -headache, -weakness, -tingling, -numbness, -speech abnormality, -memory loss, -falls, -dizziness Psychology:  -depressed mood, -agitation, -sleep problems    PHYSICAL EXAM:  BP 128/82   Pulse 68   Wt 236 lb 12.8 oz (107.4 kg)   BMI 40.02 kg/m   General Appearance: Alert, cooperative, no distress, appears stated age Head: Normocephalic, without obvious abnormality, atraumatic Eyes: PERRL, conjunctiva/corneas clear, EOM's intact, fundi benign Ears: Normal TM's and external ear canals Nose: Nares normal, mucosa normal, no drainage or sinus tenderness Throat: Lips, mucosa, and tongue normal; teeth and gums normal Neck: Supple, no lymphadenopathy; thyroid: no enlargement/tenderness/nodules; no carotid bruit or JVD Back: Spine nontender, no curvature, ROM somewhat limited, no CVA tenderness Lungs: Clear to auscultation bilaterally without wheezes, rales or ronchi; respirations unlabored Chest Wall: No tenderness or deformity Heart: Regular rate and rhythm, S1 and S2 normal, no murmur, rub or gallop Breast Exam: mammogram ordered. Declined.  Abdomen: Soft, obese, non-tender, nondistended, normoactive bowel sounds, no masses, no hepatosplenomegaly. Difficult to assess due to patient inability to get on exam table.  Genitalia: refused.  Rectal: refused. Also refused colonoscopy referral but agreed to cologuard.  Extremities: No clubbing, cyanosis or edema. Left lower leg weaker than right.  Pulses: 2+ and symmetric all extremities Skin: Skin color, texture, turgor normal, no rashes or lesions Lymph nodes: Cervical, supraclavicular, and axillary nodes normal Neurologic: CNII-XII intact, normal strength, sensation and gait; reflexes 2+ and symmetric throughout Psych: Normal mood, affect, hygiene and grooming.  ASSESSMENT/PLAN: Medicare annual wellness visit, subsequent  Type 2 diabetes mellitus with diabetic neuropathy, with long-term current  use of insulin (HCC) - Plan: POCT glycosylated hemoglobin (Hb A1C), CBC with Differential/Platelet, COMPLETE METABOLIC PANEL WITH GFR, TSH, Lipid panel, Vitamin B12, Microalbumin / creatinine urine ratio  Essential hypertension - Plan: COMPLETE METABOLIC PANEL WITH GFR  Weakness of left leg  Elevated LFTs - Plan: Hepatitis panel, acute  Estrogen deficiency - Plan: VITAMIN D 25 Hydroxy (Vit-D Deficiency, Fractures), DG Bone Density  Screening for breast cancer - Plan: MM DIGITAL SCREENING BILATERAL  Special screening for malignant neoplasms, colon  Routine general medical examination at a health care facility   A1C 8.1%. Continue on current medication regimen and continue checking daily blood sugars at least twice daily. Patient refuses to check it more often than this.  HTN- normal range. Continue on current medications.   Colonoscopy refused but will agree to have cologuard.  Mammogram ordered.  DEXA ordered.  History of elevated LFTs, repeat and order acute hepatitis panel.  Cannot calculate accurate BMI. Unable to obtain height due to inability to stand upright comfortably. Height was given by patient.  Continue with PT at home. Recommend she discuss knee feeling unstable with orthopedist as I do not find an explanation for this and she had a recent MRI on that knee.  Discussed that she does not have any LE edema today. Try compression stockings for this if swelling returns.  Continue seeing Dr. Franky Macho for back issues and upcoming MRI.   Discussed monthly self breast exams and yearly mammograms; at least 30 minutes of aerobic activity at least 5 days/week and weight-bearing exercise 2x/week; proper sunscreen use reviewed; healthy diet, including goals of calcium and vitamin D intake and alcohol recommendations (less than or equal to 1 drink/day) reviewed; regular seatbelt  use; changing batteries in smoke detectors.  Immunization recommendations discussed.  Colonoscopy recommendations  reviewed   Medicare Attestation I have personally reviewed: The patient's medical and social history Their use of alcohol, tobacco or illicit drugs Their current medications and supplements The patient's functional ability including ADLs,fall risks, home safety risks, cognitive, and hearing and visual impairment Diet and physical activities Evidence for depression or mood disorders  The patient's weight, height, and BMI have been recorded in the chart.  I have made referrals, counseling, and provided education to the patient based on review of the above and I have provided the patient with a written personalized care plan for preventive services.     Hetty BlendVickie Olden Klauer, NP   10/25/2015

## 2015-10-25 NOTE — Patient Instructions (Addendum)
MEDICARE PREVENTATIVE SERVICES (FEMALE) AND PERSONALIZED PLAN for Cassandra Stewart October 25, 2015  CONDITIONS OR RISKS IDENTIFIED TODAY: Your hemoglobin A1C today is 8.1% which has improved from your last visit.  Continue keeping an eye on your blood sugars.  Elevated liver enzymes- will repeat these and also check for hepatitis.  Screening for breast cancer- mammogram ordered Screening for osteoporosis- bone density ordered Cervical cancer screening declined by patient.  Cologuard ordered for colon cancer screening.  If you change your mind regarding immunizations then call and let us know.   SPECIFIC RECOMMENDATIONS: Continue doing PT and working on strength and stamina.  Wear the compression stocking and elevate left leg for swelling.  Follow up as scheduled with neurosurgeon.  Check with orthopedist if you still want to inquire about a specific type of knee brace.  Return the advance directives so we can scan them into your chart if you decide to fill them out.   Influenza vaccine: refused Pneumococcal vaccine: refused Shingles vaccine: refused Tdap vaccine: refused Colonoscopy: agreed to cologuard Mammogram: ordered Pelvic exam: refused Pap smear: refused   Return in 4 months for Diabetes check or sooner pending labs.    GENERAL RECOMMENDATIONS FOR GOOD HEALTH:  Supplements:  . Take a daily baby Aspirin 81mg  at bedtime for heart health unless you have a history of gastrointestinal bleed, allergy to aspirin, or are already taking higher dose Aspirin or other antiplatelet or blood thinner medication.   . Consume 1200 mg of Calcium daily through dietary calcium or supplement if you are female age 3 or older, or men 24 and older.   Men aged 79-70 should consume 1000 mg of Calcium daily. . Take 600 IU of Vitamin D daily.  Take 800 IU of Calcium daily if you are older than age 30.  . Take a general multivitamin daily.   Healthy diet: Eat a variety of foods, including  fruits, vegetables, vegetable protein such as beans, lentils, tofu, and grains, such as rice.  Limit meat or animal protein, but if you eat meat, choose leans cuts such as chicken, fish, or [place].  Drink plenty of water daily.  Decrease saturated fat in the diet, avoid lots of red meat, processed foods, sweets, fast foods, and fried foods.  Limit salt and caffeine intake.  Exercise: Aerobic exercise helps maintain good heart health. Weight bearing exercise helps keep bones and muscles working strong.  We recommend at least 30-40 minutes of exercise most days of the week.   Fall prevention: Falls are the leading cause of injuries, accidents, and accidental deaths in people over the age of 67. Falling is a real threat to your ability to live on your own.  Causes include poor eyesight or poor hearing, illness, poor lighting, throw rugs, clutter in your home, and medication side effects causing dizziness or balance problems.  Such medications can include medications for depression, sleep problems, high blood pressure, diabetes, and heart conditions.   PREVENTION  Be sure your home is as safe as possible. Here are some tips:  Wear shoes with non-skid soles (not house slippers).   Be sure your home and outside area are well lit.   Use night lights throughout your house, including hallways and stairways.   Remove clutter and clean up spills on floors and walkways.   Remove throw rugs or fasten them to the floor with carpet tape. Tack down carpet edges.   Do not place electrical cords across pathways.   Install grab bars in your  bathtub, shower, and toilet area. Towel bars should not be used as a grab bar.   Install handrails on both sides of stairways.   Do not climb on stools or stepladders. Get someone else to help with jobs that require climbing.   Do not wax your floors at all, or use a non-skid wax.   Repair uneven or unsafe sidewalks, walkways or stairs.   Keep frequently used items  within reach.   Be aware of pets so you do not trip.  Get regular check-ups from your doctor, and take good care of yourself:  Have your eyes checked every year for vision changes, cataracts, glaucoma, and other eye problems. Wear eyeglasses as directed.   Have your hearing checked every 2 years, or anytime you or others think that you cannot hear well. Use hearing aids as directed.   See your caregiver if you have foot pain or corns. Sore feet can contribute to falls.   Let your caregiver know if a medicine is making you feel dizzy or making you lose your balance.   Use a cane, walker, or wheelchair as directed. Use walker or wheelchair brakes when getting in and out.   When you get up from bed, sit on the side of the bed for 1 to 2 minutes before you stand up. This will give your blood pressure time to adjust, and you will feel less dizzy.   If you need to go to the bathroom often, consider using a bedside commode.  Disease prevention:  If you smoke or chew tobacco, find out from your caregiver how to quit. It can literally save your life, no matter how long you have been a tobacco user. If you do not use tobacco, never begin. Medicare does cover some smoking cessation counseling.  Maintain a healthy diet and normal weight. Increased weight leads to problems with blood pressure and diabetes. We check your height, weight, and BMI as part of your yearly visit.  The Body Mass Index or BMI is a way of measuring how much of your body is fat. Having a BMI above 27 increases the risk of heart disease, diabetes, hypertension, stroke and other problems related to obesity. Your caregiver can help determine your BMI and based on it develop an exercise and dietary program to help you achieve or maintain this important measurement at a healthful level.  High blood pressure causes heart and blood vessel problems.  Persistent high blood pressure should be treated with medicine if weight loss and  exercise do not work.  We check your blood pressure as part of your yearly visit.  Avoid drinking alcohol in excess (more than two drinks per day).  Avoid use of street drugs. Do not share needles with anyone. Ask for professional help if you need assistance or instructions on stopping the use of alcohol, cigarettes, and/or drugs.  Brush your teeth twice a day with fluoride toothpaste, and floss once a day. Good oral hygiene prevents tooth decay and gum disease. The problems can be painful, unattractive, and can cause other health problems. Visit your dentist for a routine oral and dental checkup and preventive care every 6-12 months.   See your eye doctor yearly for routine screening for things like glaucoma.  Look at your skin regularly.  Use a mirror to look at your back. Notify your caregivers of changes in moles, especially if there are changes in shapes, colors, a size larger than a pencil eraser, an irregular border, or development of  new moles.  Safety:  Use seatbelts 100% of the time, whether driving or as a passenger.  Use safety devices such as hearing protection if you work in environments with loud noise or significant background noise.  Use safety glasses when doing any work that could send debris in to the eyes.  Use a helmet if you ride a bike or motorcycle.  Use appropriate safety gear for contact sports.  Talk to your caregiver about gun safety.  Use sunscreen with a SPF (or skin protection factor) of 15 or greater.  Lighter skinned people are at a greater risk of skin cancer. Don't forget to also wear sunglasses in order to protect your eyes from too much damaging sunlight. Damaging sunlight can accelerate cataract formation.   If you have multiple sexual partners, or if you are not in a monogamous relationship, practice safe sex. Use condoms. Condoms are used to help reduce the spread of sexually transmitted infections (or STIs).  Consider an HIV test if you have never been  tested.  Consider routine screening for STIs if you have multiple sexual partners.   Keep carbon monoxide and smoke detectors in your home functioning at all times. Change the batteries every 6 months or use a model that plugs into the wall or is hard wired in.   END OF LIFE PLANNING/ADVANCED DIRECTIVES Advance health-care planning is deciding the kind of care you want at the end of life. While alert competent adults are able to exercise their rights to make health care and financial decisions, problems arise when an individual becomes unconscious, incapacitated, or otherwise unable to communicate or make such decisions. Advance health care directives are the legal documents in which you give written instructions about your choices limited, aggressive or palliative care if, in the future, you cannot speak for yourself.  Advanced directives include the following: A Health Care Power of Attorney allows you to appoint someone to act as your health care agent to make health care decisions for you should it be determined by your health care provider that you are no longer able to make these decisions for yourself.  A Living Will is a legal document in which you can declare that under certain conditions you desire your life not be prolonged by extraordinary or artificial means during your last illness or when you are near death. We can provide you with sample advanced directives, you can get an attorney to prepare these for you, or you can visit [place] Secretary of State's website for additional information and resources at [email or web address]  Further, I recommend you have an attorney prepare a Will and Durable Power of Attorney if you haven't done so already.  Please get [place] a copy of your health care Advanced Directives.   PREVENTATIV E CARE RECOMMENDATIONS:  Vaccinations: We recommend the following vaccinations as part of your preventative care:  Pneumococcal vaccine is recommended to  protect against certain types of pneumonia.  This is normally recommended for adults age 75 or older once, or up to every 5 years for those at high risk.  The vaccine is also recommended for adults younger than 67 years old with certain underlying conditions that make them high risk for pneumonia.  Influenza vaccine is recommended to protect against seasonal influenza or "the flu." Influenza is a serious disease that can lead to hospitalization and sometimes even death. Traditional flu vaccines (called trivalent vaccines) are made to protect against three flu viruses; an influenza A (H1N1) virus, an influenza A (H3N2)  virus, and an influenza B virus. In addition, there are flu vaccines made to protect against four flu viruses (called "quadrivalent" vaccines). These vaccines protect against the same viruses as the trivalent vaccine and an additional B virus.  We recommend the high dose influenza vaccine to those 65 years and older.  Hepatitis B vaccine to protect against a form of infection of the liver by a virus acquired from blood or body fluids, particularly for high risk groups.  Td or Tdap vaccine to protect against Tetanus, diphtheria and pertussis which can be very serious.  These diseases are caused by bacteria.  Diphtheria and pertussis are spread from person to person through coughing or sneezing.  Tetanus enters the body through cuts, scratches, or wounds.  Tetanus (Lockjaw) causes painful muscle tightening and stiffness, usually all over the body.  Diphtheria can cause a thick coating to form in the back of the throat.  It can lead to breathing problems, paralysis, heart failure, and death.  Pertussis (Whooping Cough) causes severe coughing spells, which can cause difficulty breathing, vomiting and disturbed sleep.  Td or Tdap is usually given every 10 years.  Shingles vaccine to protect against Varicella Zoster if you are older than age 67, or younger than 67 years old with certain underlying  illness.    Cancer Screening: Most routine colon cancer screening begins at the age of 67.  Subsequent colonoscopies are performed either every 5-10 years for normal screening, or every 2-5 years for higher risks patients, up until age 67 years of age. Annual screening is done with easy to use take-home tests to check for hidden blood in the stool called hemoccult tests.  Sigmoidoscopy or colonoscopy can detect the earliest forms of colon cancer and is life saving. These tests use a small camera at the end of a tube to directly examine the colon.   Pelvic Exam and Pap Smear: Pelvic exams and pap smears are performed routinely to evaluate for abnormalities as well as cancers including cervical and vaginal cancers.  This is generally performed every 2-3 years for most women, or more frequently for higher risk patients.  Mammograms: Mammograms are used to screen for breast cancer.  Medicare covers baseline screening once from ages 4735-67 years old, but will cover mammograms yearly for those 40 years and older.  In accordance with other guidelines, you may not need a mammogram every year though.  The decision on how frequently you need a mammogram should be discussed with you medical provider.    Osteoporosis Screening: Screening for osteoporosis usually begins at age 67 for women, and can be done as frequent as every 2 years.  However, women or men with higher risk of osteoporosis may be screened earlier than age 165.  Osteoporosis or low bone mass is diminished bone strength from alterations in bone architecture leading to bone fragility and increased fracture risk.     Cardiovascular Screening: Fat and cholesterol leaves deposits in your arteries that can block them. This causes heart disease and vessel disease elsewhere in your body.  If your cholesterol is found to be high, or if you have heart disease or certain other medical conditions, then you may need to have your cholesterol monitored frequently  and be treated with medication. Cardiovascular screening in the form of lab tests for cholesterol, HDL and triglycerides can be done every 5 years.  A screening electrocardiogram can be done as part of the Welcome to Medicare physical.  Diabetes Screening: Diabetes screening can be  done at least every 3 years for those with risk factors,  or every 6-80months for prediabetic patients.  Screening includes fasting blood sugar test or glucose tolerance test.  Risk factors include hypertension, dyslipidemia, obesity, previously abnormal glucose tests, family history of diabetes, age 18 years or older, and history of gestations diabetes.   AAA (abdominal aortic aneurysm) Screening: Medicare allows for a one time ultrasound to screen for abdominal aortic aneurysm if done as a referral as part of the Welcome to Medicare exam.  Men eligible for this screening include those men between age 45-11 years of age who have smoked at least 100 cigarettes in his lifetime and/or has a family history of AAA.  HIV Screening:  Medicare allows for yearly screening for patients at high risk for contracting HIV disease.

## 2015-10-26 ENCOUNTER — Other Ambulatory Visit: Payer: Self-pay | Admitting: Family Medicine

## 2015-10-26 DIAGNOSIS — M961 Postlaminectomy syndrome, not elsewhere classified: Secondary | ICD-10-CM | POA: Diagnosis not present

## 2015-10-26 DIAGNOSIS — E1122 Type 2 diabetes mellitus with diabetic chronic kidney disease: Secondary | ICD-10-CM | POA: Diagnosis not present

## 2015-10-26 DIAGNOSIS — N183 Chronic kidney disease, stage 3 (moderate): Secondary | ICD-10-CM | POA: Diagnosis not present

## 2015-10-26 DIAGNOSIS — Z7982 Long term (current) use of aspirin: Secondary | ICD-10-CM | POA: Diagnosis not present

## 2015-10-26 DIAGNOSIS — R748 Abnormal levels of other serum enzymes: Secondary | ICD-10-CM

## 2015-10-26 DIAGNOSIS — Z9181 History of falling: Secondary | ICD-10-CM | POA: Diagnosis not present

## 2015-10-26 DIAGNOSIS — W19XXXD Unspecified fall, subsequent encounter: Secondary | ICD-10-CM | POA: Diagnosis not present

## 2015-10-26 DIAGNOSIS — M6281 Muscle weakness (generalized): Secondary | ICD-10-CM | POA: Diagnosis not present

## 2015-10-26 DIAGNOSIS — Z794 Long term (current) use of insulin: Secondary | ICD-10-CM | POA: Diagnosis not present

## 2015-10-26 DIAGNOSIS — S82122D Displaced fracture of lateral condyle of left tibia, subsequent encounter for closed fracture with routine healing: Secondary | ICD-10-CM | POA: Diagnosis not present

## 2015-10-26 DIAGNOSIS — E11610 Type 2 diabetes mellitus with diabetic neuropathic arthropathy: Secondary | ICD-10-CM | POA: Diagnosis not present

## 2015-10-26 DIAGNOSIS — J45909 Unspecified asthma, uncomplicated: Secondary | ICD-10-CM | POA: Diagnosis not present

## 2015-10-26 LAB — HEPATITIS PANEL, ACUTE
HCV AB: NEGATIVE
HEP B C IGM: NONREACTIVE
Hep A IgM: NONREACTIVE
Hepatitis B Surface Ag: NEGATIVE

## 2015-10-26 LAB — VITAMIN D 25 HYDROXY (VIT D DEFICIENCY, FRACTURES): Vit D, 25-Hydroxy: 28 ng/mL — ABNORMAL LOW (ref 30–100)

## 2015-10-26 LAB — MICROALBUMIN / CREATININE URINE RATIO
Creatinine, Urine: 186 mg/dL (ref 20–320)
MICROALB UR: 10.6 mg/dL
MICROALB/CREAT RATIO: 57 ug/mg{creat} — AB (ref <30)

## 2015-10-31 ENCOUNTER — Other Ambulatory Visit: Payer: Self-pay | Admitting: Neurosurgery

## 2015-10-31 DIAGNOSIS — E11610 Type 2 diabetes mellitus with diabetic neuropathic arthropathy: Secondary | ICD-10-CM | POA: Diagnosis not present

## 2015-10-31 DIAGNOSIS — M545 Low back pain: Secondary | ICD-10-CM

## 2015-10-31 DIAGNOSIS — Z794 Long term (current) use of insulin: Secondary | ICD-10-CM | POA: Diagnosis not present

## 2015-10-31 DIAGNOSIS — J45909 Unspecified asthma, uncomplicated: Secondary | ICD-10-CM | POA: Diagnosis not present

## 2015-10-31 DIAGNOSIS — Z7982 Long term (current) use of aspirin: Secondary | ICD-10-CM | POA: Diagnosis not present

## 2015-10-31 DIAGNOSIS — M4804 Spinal stenosis, thoracic region: Secondary | ICD-10-CM

## 2015-10-31 DIAGNOSIS — S82122D Displaced fracture of lateral condyle of left tibia, subsequent encounter for closed fracture with routine healing: Secondary | ICD-10-CM | POA: Diagnosis not present

## 2015-10-31 DIAGNOSIS — N183 Chronic kidney disease, stage 3 (moderate): Secondary | ICD-10-CM | POA: Diagnosis not present

## 2015-10-31 DIAGNOSIS — M961 Postlaminectomy syndrome, not elsewhere classified: Secondary | ICD-10-CM | POA: Diagnosis not present

## 2015-10-31 DIAGNOSIS — M6281 Muscle weakness (generalized): Secondary | ICD-10-CM | POA: Diagnosis not present

## 2015-10-31 DIAGNOSIS — E1122 Type 2 diabetes mellitus with diabetic chronic kidney disease: Secondary | ICD-10-CM | POA: Diagnosis not present

## 2015-10-31 DIAGNOSIS — Z9181 History of falling: Secondary | ICD-10-CM | POA: Diagnosis not present

## 2015-10-31 DIAGNOSIS — W19XXXD Unspecified fall, subsequent encounter: Secondary | ICD-10-CM | POA: Diagnosis not present

## 2015-11-02 DIAGNOSIS — W19XXXD Unspecified fall, subsequent encounter: Secondary | ICD-10-CM | POA: Diagnosis not present

## 2015-11-02 DIAGNOSIS — N183 Chronic kidney disease, stage 3 (moderate): Secondary | ICD-10-CM | POA: Diagnosis not present

## 2015-11-02 DIAGNOSIS — Z794 Long term (current) use of insulin: Secondary | ICD-10-CM | POA: Diagnosis not present

## 2015-11-02 DIAGNOSIS — E11610 Type 2 diabetes mellitus with diabetic neuropathic arthropathy: Secondary | ICD-10-CM | POA: Diagnosis not present

## 2015-11-02 DIAGNOSIS — J45909 Unspecified asthma, uncomplicated: Secondary | ICD-10-CM | POA: Diagnosis not present

## 2015-11-02 DIAGNOSIS — Z7982 Long term (current) use of aspirin: Secondary | ICD-10-CM | POA: Diagnosis not present

## 2015-11-02 DIAGNOSIS — M961 Postlaminectomy syndrome, not elsewhere classified: Secondary | ICD-10-CM | POA: Diagnosis not present

## 2015-11-02 DIAGNOSIS — Z9181 History of falling: Secondary | ICD-10-CM | POA: Diagnosis not present

## 2015-11-02 DIAGNOSIS — S82122D Displaced fracture of lateral condyle of left tibia, subsequent encounter for closed fracture with routine healing: Secondary | ICD-10-CM | POA: Diagnosis not present

## 2015-11-02 DIAGNOSIS — E1122 Type 2 diabetes mellitus with diabetic chronic kidney disease: Secondary | ICD-10-CM | POA: Diagnosis not present

## 2015-11-02 DIAGNOSIS — M6281 Muscle weakness (generalized): Secondary | ICD-10-CM | POA: Diagnosis not present

## 2015-11-04 DIAGNOSIS — G6289 Other specified polyneuropathies: Secondary | ICD-10-CM | POA: Diagnosis not present

## 2015-11-04 DIAGNOSIS — M6281 Muscle weakness (generalized): Secondary | ICD-10-CM | POA: Diagnosis not present

## 2015-11-06 ENCOUNTER — Ambulatory Visit
Admission: RE | Admit: 2015-11-06 | Discharge: 2015-11-06 | Disposition: A | Discharge status: Home / Self Care | Payer: Medicare Other | Source: Ambulatory Visit | Attending: Family Medicine | Admitting: Family Medicine

## 2015-11-06 DIAGNOSIS — R945 Abnormal results of liver function studies: Secondary | ICD-10-CM | POA: Diagnosis not present

## 2015-11-06 DIAGNOSIS — R748 Abnormal levels of other serum enzymes: Secondary | ICD-10-CM

## 2015-11-07 DIAGNOSIS — M961 Postlaminectomy syndrome, not elsewhere classified: Secondary | ICD-10-CM | POA: Diagnosis not present

## 2015-11-07 DIAGNOSIS — W19XXXD Unspecified fall, subsequent encounter: Secondary | ICD-10-CM | POA: Diagnosis not present

## 2015-11-07 DIAGNOSIS — N183 Chronic kidney disease, stage 3 (moderate): Secondary | ICD-10-CM | POA: Diagnosis not present

## 2015-11-07 DIAGNOSIS — M6281 Muscle weakness (generalized): Secondary | ICD-10-CM | POA: Diagnosis not present

## 2015-11-07 DIAGNOSIS — E11610 Type 2 diabetes mellitus with diabetic neuropathic arthropathy: Secondary | ICD-10-CM | POA: Diagnosis not present

## 2015-11-07 DIAGNOSIS — Z7982 Long term (current) use of aspirin: Secondary | ICD-10-CM | POA: Diagnosis not present

## 2015-11-07 DIAGNOSIS — Z794 Long term (current) use of insulin: Secondary | ICD-10-CM | POA: Diagnosis not present

## 2015-11-07 DIAGNOSIS — Z9181 History of falling: Secondary | ICD-10-CM | POA: Diagnosis not present

## 2015-11-07 DIAGNOSIS — E1122 Type 2 diabetes mellitus with diabetic chronic kidney disease: Secondary | ICD-10-CM | POA: Diagnosis not present

## 2015-11-07 DIAGNOSIS — S82122D Displaced fracture of lateral condyle of left tibia, subsequent encounter for closed fracture with routine healing: Secondary | ICD-10-CM | POA: Diagnosis not present

## 2015-11-07 DIAGNOSIS — J45909 Unspecified asthma, uncomplicated: Secondary | ICD-10-CM | POA: Diagnosis not present

## 2015-11-08 ENCOUNTER — Ambulatory Visit
Admission: RE | Admit: 2015-11-08 | Discharge: 2015-11-08 | Disposition: A | Discharge status: Home / Self Care | Payer: Medicare Other | Source: Ambulatory Visit | Attending: Neurosurgery | Admitting: Neurosurgery

## 2015-11-08 DIAGNOSIS — M4804 Spinal stenosis, thoracic region: Secondary | ICD-10-CM | POA: Diagnosis not present

## 2015-11-08 DIAGNOSIS — M545 Low back pain: Secondary | ICD-10-CM

## 2015-11-08 DIAGNOSIS — M5126 Other intervertebral disc displacement, lumbar region: Secondary | ICD-10-CM | POA: Diagnosis not present

## 2015-11-08 MED ORDER — GADOBENATE DIMEGLUMINE 529 MG/ML IV SOLN: 20.0000 mL | Freq: Once | INTRAVENOUS | Status: DC | PRN

## 2015-11-09 DIAGNOSIS — M4804 Spinal stenosis, thoracic region: Secondary | ICD-10-CM | POA: Diagnosis not present

## 2015-11-10 DIAGNOSIS — M6281 Muscle weakness (generalized): Secondary | ICD-10-CM | POA: Diagnosis not present

## 2015-11-10 DIAGNOSIS — Z794 Long term (current) use of insulin: Secondary | ICD-10-CM | POA: Diagnosis not present

## 2015-11-10 DIAGNOSIS — E11610 Type 2 diabetes mellitus with diabetic neuropathic arthropathy: Secondary | ICD-10-CM | POA: Diagnosis not present

## 2015-11-10 DIAGNOSIS — W19XXXD Unspecified fall, subsequent encounter: Secondary | ICD-10-CM | POA: Diagnosis not present

## 2015-11-10 DIAGNOSIS — Z7982 Long term (current) use of aspirin: Secondary | ICD-10-CM | POA: Diagnosis not present

## 2015-11-10 DIAGNOSIS — E1122 Type 2 diabetes mellitus with diabetic chronic kidney disease: Secondary | ICD-10-CM | POA: Diagnosis not present

## 2015-11-10 DIAGNOSIS — J45909 Unspecified asthma, uncomplicated: Secondary | ICD-10-CM | POA: Diagnosis not present

## 2015-11-10 DIAGNOSIS — Z9181 History of falling: Secondary | ICD-10-CM | POA: Diagnosis not present

## 2015-11-10 DIAGNOSIS — S82122D Displaced fracture of lateral condyle of left tibia, subsequent encounter for closed fracture with routine healing: Secondary | ICD-10-CM | POA: Diagnosis not present

## 2015-11-10 DIAGNOSIS — M961 Postlaminectomy syndrome, not elsewhere classified: Secondary | ICD-10-CM | POA: Diagnosis not present

## 2015-11-10 DIAGNOSIS — N183 Chronic kidney disease, stage 3 (moderate): Secondary | ICD-10-CM | POA: Diagnosis not present

## 2015-11-14 DIAGNOSIS — E11610 Type 2 diabetes mellitus with diabetic neuropathic arthropathy: Secondary | ICD-10-CM | POA: Diagnosis not present

## 2015-11-14 DIAGNOSIS — S82122D Displaced fracture of lateral condyle of left tibia, subsequent encounter for closed fracture with routine healing: Secondary | ICD-10-CM | POA: Diagnosis not present

## 2015-11-14 DIAGNOSIS — J45909 Unspecified asthma, uncomplicated: Secondary | ICD-10-CM | POA: Diagnosis not present

## 2015-11-14 DIAGNOSIS — W19XXXD Unspecified fall, subsequent encounter: Secondary | ICD-10-CM | POA: Diagnosis not present

## 2015-11-14 DIAGNOSIS — Z7982 Long term (current) use of aspirin: Secondary | ICD-10-CM | POA: Diagnosis not present

## 2015-11-14 DIAGNOSIS — Z794 Long term (current) use of insulin: Secondary | ICD-10-CM | POA: Diagnosis not present

## 2015-11-14 DIAGNOSIS — M961 Postlaminectomy syndrome, not elsewhere classified: Secondary | ICD-10-CM | POA: Diagnosis not present

## 2015-11-14 DIAGNOSIS — Z9181 History of falling: Secondary | ICD-10-CM | POA: Diagnosis not present

## 2015-11-14 DIAGNOSIS — E1122 Type 2 diabetes mellitus with diabetic chronic kidney disease: Secondary | ICD-10-CM | POA: Diagnosis not present

## 2015-11-14 DIAGNOSIS — M6281 Muscle weakness (generalized): Secondary | ICD-10-CM | POA: Diagnosis not present

## 2015-11-14 DIAGNOSIS — N183 Chronic kidney disease, stage 3 (moderate): Secondary | ICD-10-CM | POA: Diagnosis not present

## 2015-11-16 DIAGNOSIS — N183 Chronic kidney disease, stage 3 (moderate): Secondary | ICD-10-CM | POA: Diagnosis not present

## 2015-11-16 DIAGNOSIS — M961 Postlaminectomy syndrome, not elsewhere classified: Secondary | ICD-10-CM | POA: Diagnosis not present

## 2015-11-16 DIAGNOSIS — J45909 Unspecified asthma, uncomplicated: Secondary | ICD-10-CM | POA: Diagnosis not present

## 2015-11-16 DIAGNOSIS — W19XXXD Unspecified fall, subsequent encounter: Secondary | ICD-10-CM | POA: Diagnosis not present

## 2015-11-16 DIAGNOSIS — S82122D Displaced fracture of lateral condyle of left tibia, subsequent encounter for closed fracture with routine healing: Secondary | ICD-10-CM | POA: Diagnosis not present

## 2015-11-16 DIAGNOSIS — M6281 Muscle weakness (generalized): Secondary | ICD-10-CM | POA: Diagnosis not present

## 2015-11-16 DIAGNOSIS — Z7982 Long term (current) use of aspirin: Secondary | ICD-10-CM | POA: Diagnosis not present

## 2015-11-16 DIAGNOSIS — E11610 Type 2 diabetes mellitus with diabetic neuropathic arthropathy: Secondary | ICD-10-CM | POA: Diagnosis not present

## 2015-11-16 DIAGNOSIS — E1122 Type 2 diabetes mellitus with diabetic chronic kidney disease: Secondary | ICD-10-CM | POA: Diagnosis not present

## 2015-11-16 DIAGNOSIS — Z794 Long term (current) use of insulin: Secondary | ICD-10-CM | POA: Diagnosis not present

## 2015-11-16 DIAGNOSIS — Z9181 History of falling: Secondary | ICD-10-CM | POA: Diagnosis not present

## 2015-11-21 DIAGNOSIS — W19XXXD Unspecified fall, subsequent encounter: Secondary | ICD-10-CM | POA: Diagnosis not present

## 2015-11-21 DIAGNOSIS — Z7982 Long term (current) use of aspirin: Secondary | ICD-10-CM | POA: Diagnosis not present

## 2015-11-21 DIAGNOSIS — M961 Postlaminectomy syndrome, not elsewhere classified: Secondary | ICD-10-CM | POA: Diagnosis not present

## 2015-11-21 DIAGNOSIS — Z794 Long term (current) use of insulin: Secondary | ICD-10-CM | POA: Diagnosis not present

## 2015-11-21 DIAGNOSIS — J45909 Unspecified asthma, uncomplicated: Secondary | ICD-10-CM | POA: Diagnosis not present

## 2015-11-21 DIAGNOSIS — S82122D Displaced fracture of lateral condyle of left tibia, subsequent encounter for closed fracture with routine healing: Secondary | ICD-10-CM | POA: Diagnosis not present

## 2015-11-21 DIAGNOSIS — E11610 Type 2 diabetes mellitus with diabetic neuropathic arthropathy: Secondary | ICD-10-CM | POA: Diagnosis not present

## 2015-11-21 DIAGNOSIS — M6281 Muscle weakness (generalized): Secondary | ICD-10-CM | POA: Diagnosis not present

## 2015-11-21 DIAGNOSIS — E1122 Type 2 diabetes mellitus with diabetic chronic kidney disease: Secondary | ICD-10-CM | POA: Diagnosis not present

## 2015-11-21 DIAGNOSIS — Z9181 History of falling: Secondary | ICD-10-CM | POA: Diagnosis not present

## 2015-11-21 DIAGNOSIS — N183 Chronic kidney disease, stage 3 (moderate): Secondary | ICD-10-CM | POA: Diagnosis not present

## 2015-11-23 DIAGNOSIS — S82122D Displaced fracture of lateral condyle of left tibia, subsequent encounter for closed fracture with routine healing: Secondary | ICD-10-CM | POA: Diagnosis not present

## 2015-11-23 DIAGNOSIS — E1122 Type 2 diabetes mellitus with diabetic chronic kidney disease: Secondary | ICD-10-CM | POA: Diagnosis not present

## 2015-11-23 DIAGNOSIS — Z9181 History of falling: Secondary | ICD-10-CM | POA: Diagnosis not present

## 2015-11-23 DIAGNOSIS — N183 Chronic kidney disease, stage 3 (moderate): Secondary | ICD-10-CM | POA: Diagnosis not present

## 2015-11-23 DIAGNOSIS — M6281 Muscle weakness (generalized): Secondary | ICD-10-CM | POA: Diagnosis not present

## 2015-11-23 DIAGNOSIS — M961 Postlaminectomy syndrome, not elsewhere classified: Secondary | ICD-10-CM | POA: Diagnosis not present

## 2015-11-23 DIAGNOSIS — J45909 Unspecified asthma, uncomplicated: Secondary | ICD-10-CM | POA: Diagnosis not present

## 2015-11-23 DIAGNOSIS — Z794 Long term (current) use of insulin: Secondary | ICD-10-CM | POA: Diagnosis not present

## 2015-11-23 DIAGNOSIS — Z7982 Long term (current) use of aspirin: Secondary | ICD-10-CM | POA: Diagnosis not present

## 2015-11-23 DIAGNOSIS — E11610 Type 2 diabetes mellitus with diabetic neuropathic arthropathy: Secondary | ICD-10-CM | POA: Diagnosis not present

## 2015-11-23 DIAGNOSIS — W19XXXD Unspecified fall, subsequent encounter: Secondary | ICD-10-CM | POA: Diagnosis not present

## 2015-11-27 ENCOUNTER — Encounter: Payer: Self-pay | Admitting: Family Medicine

## 2015-11-28 DIAGNOSIS — Z9181 History of falling: Secondary | ICD-10-CM | POA: Diagnosis not present

## 2015-11-28 DIAGNOSIS — W19XXXD Unspecified fall, subsequent encounter: Secondary | ICD-10-CM | POA: Diagnosis not present

## 2015-11-28 DIAGNOSIS — Z794 Long term (current) use of insulin: Secondary | ICD-10-CM | POA: Diagnosis not present

## 2015-11-28 DIAGNOSIS — J45909 Unspecified asthma, uncomplicated: Secondary | ICD-10-CM | POA: Diagnosis not present

## 2015-11-28 DIAGNOSIS — Z7982 Long term (current) use of aspirin: Secondary | ICD-10-CM | POA: Diagnosis not present

## 2015-11-28 DIAGNOSIS — E1122 Type 2 diabetes mellitus with diabetic chronic kidney disease: Secondary | ICD-10-CM | POA: Diagnosis not present

## 2015-11-28 DIAGNOSIS — M961 Postlaminectomy syndrome, not elsewhere classified: Secondary | ICD-10-CM | POA: Diagnosis not present

## 2015-11-28 DIAGNOSIS — M6281 Muscle weakness (generalized): Secondary | ICD-10-CM | POA: Diagnosis not present

## 2015-11-28 DIAGNOSIS — N183 Chronic kidney disease, stage 3 (moderate): Secondary | ICD-10-CM | POA: Diagnosis not present

## 2015-11-28 DIAGNOSIS — E11610 Type 2 diabetes mellitus with diabetic neuropathic arthropathy: Secondary | ICD-10-CM | POA: Diagnosis not present

## 2015-11-28 DIAGNOSIS — S82122D Displaced fracture of lateral condyle of left tibia, subsequent encounter for closed fracture with routine healing: Secondary | ICD-10-CM | POA: Diagnosis not present

## 2015-11-30 DIAGNOSIS — J45909 Unspecified asthma, uncomplicated: Secondary | ICD-10-CM | POA: Diagnosis not present

## 2015-11-30 DIAGNOSIS — Z794 Long term (current) use of insulin: Secondary | ICD-10-CM | POA: Diagnosis not present

## 2015-11-30 DIAGNOSIS — M961 Postlaminectomy syndrome, not elsewhere classified: Secondary | ICD-10-CM | POA: Diagnosis not present

## 2015-11-30 DIAGNOSIS — E1122 Type 2 diabetes mellitus with diabetic chronic kidney disease: Secondary | ICD-10-CM | POA: Diagnosis not present

## 2015-11-30 DIAGNOSIS — S82122D Displaced fracture of lateral condyle of left tibia, subsequent encounter for closed fracture with routine healing: Secondary | ICD-10-CM | POA: Diagnosis not present

## 2015-11-30 DIAGNOSIS — N183 Chronic kidney disease, stage 3 (moderate): Secondary | ICD-10-CM | POA: Diagnosis not present

## 2015-11-30 DIAGNOSIS — W19XXXD Unspecified fall, subsequent encounter: Secondary | ICD-10-CM | POA: Diagnosis not present

## 2015-11-30 DIAGNOSIS — E11610 Type 2 diabetes mellitus with diabetic neuropathic arthropathy: Secondary | ICD-10-CM | POA: Diagnosis not present

## 2015-11-30 DIAGNOSIS — Z7982 Long term (current) use of aspirin: Secondary | ICD-10-CM | POA: Diagnosis not present

## 2015-11-30 DIAGNOSIS — M6281 Muscle weakness (generalized): Secondary | ICD-10-CM | POA: Diagnosis not present

## 2015-11-30 DIAGNOSIS — Z9181 History of falling: Secondary | ICD-10-CM | POA: Diagnosis not present

## 2015-12-04 DIAGNOSIS — G6289 Other specified polyneuropathies: Secondary | ICD-10-CM | POA: Diagnosis not present

## 2015-12-04 DIAGNOSIS — M6281 Muscle weakness (generalized): Secondary | ICD-10-CM | POA: Diagnosis not present

## 2015-12-22 DIAGNOSIS — M2351 Chronic instability of knee, right knee: Secondary | ICD-10-CM | POA: Diagnosis not present

## 2016-01-03 ENCOUNTER — Other Ambulatory Visit: Payer: Self-pay | Admitting: Family Medicine

## 2016-01-03 NOTE — Telephone Encounter (Signed)
Is this okay to refill? 

## 2016-01-03 NOTE — Telephone Encounter (Signed)
Ok to refill for 3 months and then she will need to follow up.

## 2016-01-04 DIAGNOSIS — M6281 Muscle weakness (generalized): Secondary | ICD-10-CM | POA: Diagnosis not present

## 2016-01-04 DIAGNOSIS — G6289 Other specified polyneuropathies: Secondary | ICD-10-CM | POA: Diagnosis not present

## 2016-02-03 DIAGNOSIS — G6289 Other specified polyneuropathies: Secondary | ICD-10-CM | POA: Diagnosis not present

## 2016-02-03 DIAGNOSIS — M6281 Muscle weakness (generalized): Secondary | ICD-10-CM | POA: Diagnosis not present

## 2016-02-05 ENCOUNTER — Other Ambulatory Visit: Payer: Self-pay | Admitting: Family Medicine

## 2016-02-29 ENCOUNTER — Ambulatory Visit: Payer: Medicare Other | Admitting: Family Medicine

## 2016-03-04 ENCOUNTER — Encounter: Payer: Self-pay | Admitting: Family Medicine

## 2016-03-04 ENCOUNTER — Ambulatory Visit (INDEPENDENT_AMBULATORY_CARE_PROVIDER_SITE_OTHER): Payer: Medicare Other | Admitting: Family Medicine

## 2016-03-04 VITALS — BP 132/80 | HR 78

## 2016-03-04 DIAGNOSIS — E1140 Type 2 diabetes mellitus with diabetic neuropathy, unspecified: Secondary | ICD-10-CM | POA: Diagnosis not present

## 2016-03-04 DIAGNOSIS — E559 Vitamin D deficiency, unspecified: Secondary | ICD-10-CM

## 2016-03-04 DIAGNOSIS — R945 Abnormal results of liver function studies: Secondary | ICD-10-CM

## 2016-03-04 DIAGNOSIS — Z794 Long term (current) use of insulin: Secondary | ICD-10-CM | POA: Diagnosis not present

## 2016-03-04 DIAGNOSIS — I10 Essential (primary) hypertension: Secondary | ICD-10-CM | POA: Diagnosis not present

## 2016-03-04 DIAGNOSIS — R7989 Other specified abnormal findings of blood chemistry: Secondary | ICD-10-CM | POA: Diagnosis not present

## 2016-03-04 LAB — COMPLETE METABOLIC PANEL WITH GFR
ALT: 35 U/L — AB (ref 6–29)
AST: 32 U/L (ref 10–35)
Albumin: 3.7 g/dL (ref 3.6–5.1)
Alkaline Phosphatase: 80 U/L (ref 33–130)
BILIRUBIN TOTAL: 0.4 mg/dL (ref 0.2–1.2)
BUN: 15 mg/dL (ref 7–25)
CALCIUM: 9.4 mg/dL (ref 8.6–10.4)
CO2: 28 mmol/L (ref 20–31)
CREATININE: 0.96 mg/dL (ref 0.50–0.99)
Chloride: 104 mmol/L (ref 98–110)
GFR, EST AFRICAN AMERICAN: 71 mL/min (ref >=60)
GFR, Est Non African American: 61 mL/min (ref >=60)
Glucose, Bld: 212 mg/dL — ABNORMAL HIGH (ref 65–99)
Potassium: 4.4 mmol/L (ref 3.5–5.3)
Sodium: 140 mmol/L (ref 135–146)
TOTAL PROTEIN: 6.9 g/dL (ref 6.1–8.1)

## 2016-03-04 LAB — CBC WITH DIFFERENTIAL/PLATELET
BASOS PCT: 1 %
Basophils Absolute: 66 cells/uL (ref 0–200)
EOS ABS: 198 {cells}/uL (ref 15–500)
Eosinophils Relative: 3 %
HCT: 39.8 % (ref 35.0–45.0)
HEMOGLOBIN: 12.8 g/dL (ref 11.7–15.5)
LYMPHS ABS: 2640 {cells}/uL (ref 850–3900)
LYMPHS PCT: 40 %
MCH: 28.2 pg (ref 27.0–33.0)
MCHC: 32.2 g/dL (ref 32.0–36.0)
MCV: 87.7 fL (ref 80.0–100.0)
MONO ABS: 594 {cells}/uL (ref 200–950)
MPV: 11.8 fL (ref 7.5–12.5)
Monocytes Relative: 9 %
Neutro Abs: 3102 cells/uL (ref 1500–7800)
Neutrophils Relative %: 47 %
Platelets: 299 10*3/uL (ref 140–400)
RBC: 4.54 MIL/uL (ref 3.80–5.10)
RDW: 13.7 % (ref 11.0–15.0)
WBC: 6.6 10*3/uL (ref 4.0–10.5)

## 2016-03-04 LAB — LIPID PANEL
CHOLESTEROL: 176 mg/dL (ref <200)
HDL: 70 mg/dL (ref >50)
LDL Cholesterol: 88 mg/dL (ref <100)
TRIGLYCERIDES: 91 mg/dL (ref <150)
Total CHOL/HDL Ratio: 2.5 Ratio (ref <5.0)
VLDL: 18 mg/dL (ref <30)

## 2016-03-04 LAB — POCT GLYCOSYLATED HEMOGLOBIN (HGB A1C): Hemoglobin A1C: 10.5

## 2016-03-04 MED ORDER — GLUCOSE BLOOD VI STRP: ORAL_STRIP | 3 refills | Status: DC

## 2016-03-04 MED ORDER — ACCU-CHEK SOFTCLIX LANCETS MISC: 3 refills | Status: DC

## 2016-03-04 NOTE — Patient Instructions (Signed)
Start checking your blood sugar 3 times daily.  Fasting (before breakfast) goal readings 90-130 2 hours after meals. Goal readings 130-180 Your hemoglobin A1C is 10.5 today.   Call me in one week (next Monday) and give me your readings.  Pay close attention to your carbohydrates and sugar.  If you change your mind about going to the nutritionist, let me know and I will refer you.    Carbohydrate Counting for Diabetes Mellitus, Adult Carbohydrate counting is a method for keeping track of how many carbohydrates you eat. Eating carbohydrates naturally increases the amount of sugar (glucose) in the blood. Counting how many carbohydrates you eat helps keep your blood glucose within normal limits, which helps you manage your diabetes (diabetes mellitus). It is important to know how many carbohydrates you can safely have in each meal. This is different for every person. A diet and nutrition specialist (registered dietitian) can help you make a meal plan and calculate how many carbohydrates you should have at each meal and snack. Carbohydrates are found in the following foods:  Grains, such as breads and cereals.  Dried beans and soy products.  Starchy vegetables, such as potatoes, peas, and corn.  Fruit and fruit juices.  Milk and yogurt.  Sweets and snack foods, such as cake, cookies, candy, chips, and soft drinks. How do I count carbohydrates? There are two ways to count carbohydrates in food. You can use either of the methods or a combination of both. Reading "Nutrition Facts" on packaged food  The "Nutrition Facts" list is included on the labels of almost all packaged foods and beverages in the [place] It includes:  The serving size.  Information about nutrients in each serving, including the grams (g) of carbohydrate per serving. To use the "Nutrition Facts":  Decide how many servings you will have.  Multiply the number of servings by the number of carbohydrates per serving.  The  resulting number is the total amount of carbohydrates that you will be having. Learning standard serving sizes of other foods  When you eat foods containing carbohydrates that are not packaged or do not include "Nutrition Facts" on the label, you need to measure the servings in order to count the amount of carbohydrates:  Measure the foods that you will eat with a food scale or measuring cup, if needed.  Decide how many standard-size servings you will eat.  Multiply the number of servings by 15. Most carbohydrate-rich foods have about 15 g of carbohydrates per serving.  For example, if you eat 8 oz (170 g) of strawberries, you will have eaten 2 servings and 30 g of carbohydrates (2 servings x 15 g = 30 g).  For foods that have more than one food mixed, such as soups and casseroles, you must count the carbohydrates in each food that is included. The following list contains standard serving sizes of common carbohydrate-rich foods. Each of these servings has about 15 g of carbohydrates:   hamburger bun or  English muffin.   oz (15 mL) syrup.   oz (14 g) jelly.  1 slice of bread.  1 six-inch tortilla.  3 oz (85 g) cooked rice or pasta.  4 oz (113 g) cooked dried beans.  4 oz (113 g) starchy vegetable, such as peas, corn, or potatoes.  4 oz (113 g) hot cereal.  4 oz (113 g) mashed potatoes or  of a large baked potato.  4 oz (113 g) canned or frozen fruit.  4 oz (120 mL)  fruit juice.  4-6 crackers.  6 chicken nuggets.  6 oz (170 g) unsweetened dry cereal.  6 oz (170 g) plain fat-free yogurt or yogurt sweetened with artificial sweeteners.  8 oz (240 mL) milk.  8 oz (170 g) fresh fruit or one small piece of fruit.  24 oz (680 g) popped popcorn. Example of carbohydrate counting Sample meal  3 oz (85 g) chicken breast.  6 oz (170 g) brown rice.  4 oz (113 g) corn.  8 oz (240 mL) milk.  8 oz (170 g) strawberries with sugar-free whipped topping. Carbohydrate  calculation 1. Identify the foods that contain carbohydrates:  Rice.  Corn.  Milk.  Strawberries. 2. Calculate how many servings you have of each food:  2 servings rice.  1 serving corn.  1 serving milk.  1 serving strawberries. 3. Multiply each number of servings by 15 g:  2 servings rice x 15 g = 30 g.  1 serving corn x 15 g = 15 g.  1 serving milk x 15 g = 15 g.  1 serving strawberries x 15 g = 15 g. 4. Add together all of the amounts to find the total grams of carbohydrates eaten:  30 g + 15 g + 15 g + 15 g = 75 g of carbohydrates total. This information is not intended to replace advice given to you by your health care provider. Make sure you discuss any questions you have with your health care provider. Document Released: 02/11/2005 Document Revised: 09/01/2015 Document Reviewed: 07/26/2015 Elsevier Interactive Patient Education  2017 [employer]

## 2016-03-04 NOTE — Progress Notes (Signed)
Subjective:    Patient ID: Cassandra Stewart, female    DOB: Jun 18, 1950, 66 y.o.   MRN: [id number]  Cassandra Stewart is a 66 y.o. female who presents for follow-up of Type 2 diabetes mellitus. States she was [id number] years old when she was diagnosed with type 2 DM. She has been taking insulin since 2008.  She reports doing well and thinks her chronic health conditions are well controlled. She has not concerns or complaints today. She is getting out of her wheelchair more, left leg weakness has improved.  Taking daily vitamin D 1,000 IU for vitamin d deficiency.  She is aware that we need to repeat LFTs. Had an abdominal US that showed fatty liver.   Patient is checking home blood sugars.   Home blood sugar records: BGs range between 139 and 200  How often is blood sugars being checked: once daily Current symptoms include: nausea, polydipsia, polyuria, visual disturbances and vomitting. Patient denies nausea, visual disturbances, vomiting and weight loss.  Patient is checking their feet daily. Any Foot concerns (callous, ulcer, wound, thickened nails, toenail fungus, skin fungus, hammer toe): none Last dilated eye exam: had eye surgery last year- within a year.    Current treatments: DM meds are doing well. toujeo 45 units daily, humolog 18 units tid daily. Metformin - is not taking this regularly  Medication compliance: she reports good compliance.  Current diet: in general, a "healthy" diet   eats 2 meals per day, first meal is mid morning and dinner around 6 or 7pm. States she does not eat many carbohydrates.  Current exercise: leg lifts Known diabetic complications: none   Blood pressure- reports good medication compliance and does not check her BP at home. States she eats low salt mostly.   The following portions of the patient's history were reviewed and updated as appropriate: allergies, current medications, past medical history, past social history and problem list.  ROS as in  subjective above.     Objective:    Physical Exam Alert and in no distress.  Neck is supple without adenopathy or thyromegaly. Cardiac exam shows a regular sinus rhythm without murmurs or gallops. Lungs are clear to auscultation. LE without edema.   Blood pressure 132/80, pulse 78.  Lab Review Diabetic Labs Latest Ref Rng & Units 03/04/2016 10/25/2015 09/13/2015 07/09/2015 07/08/2015  HbA1c - 10.5% 8.1% - - -  Microalbumin Not estab mg/dL - 95.610.6 - - -  Micro/Creat Ratio <30 mcg/mg creat - 57(H) - - -  Chol 125 - 200 mg/dL - 213179 - - -  HDL >=08>=46 mg/dL - 67 - - -  Calc LDL <657<130 mg/dL - 92 - - -  Triglycerides <150 mg/dL - 846102 - - -  Creatinine 0.50 - 0.99 mg/dL - 9.620.92 9.520.94 8.41(L1.17(H) 2.44(W1.64(H)   BP/Weight 03/04/2016 10/25/2015 09/13/2015 08/23/2015 07/10/2015  Systolic BP 132 128 128 132 129  Diastolic BP 80 82 78 80 47  Wt. (Lbs) - 236.8 - - -  BMI - 40.02 - - -   No flowsheet data found.  Cassandra Stewart  reports that she has never smoked. She has never used smokeless tobacco. She reports that she does not drink alcohol or use drugs.     Assessment & Plan:    Type 2 diabetes mellitus with diabetic neuropathy, with long-term current use of insulin (HCC) - Plan: HgB A1c, CBC with Differential/Platelet, COMPLETE METABOLIC PANEL WITH GFR, Lipid panel  Essential hypertension - Plan: CBC with Differential/Platelet, COMPLETE METABOLIC PANEL WITH GFR,  Lipid panel  Elevated LFTs  Vitamin D deficiency - Plan: VITAMIN D 25 Hydroxy (Vit-D Deficiency, Fractures)  1. Rx changes: none discussed strict medication compliance.  2. Education: Reviewed 'ABCs' of diabetes management (respective goals in parentheses):  A1C (<7), blood pressure (<130/80), and cholesterol (LDL <100). 3. Compliance at present is estimated to be inadequate. Efforts to improve compliance (if necessary) will be directed at dietary modifications: count carbohydrates, no more than 45 per meal and cut back on sugar.   4. Follow up: 3 months or  sooner pending labs. Plan to have her call me in 1 week with blood sugar readings (will have her check 3 times per day). Will consider adjusting medication based on her readings or referral to endocrinologist at that point. Patient is aware that her blood sugars are not well controlled and she states she does not understand why. Discussed that we need more information and strict adherence to medication, diet and BS checks. She cannot exercise due to leg weakness.  5. Offered referral to MNT and she refuses.  6. Blood pressure is very close to goal. Continue on current medication regimen and eat low salt.  7. History of elevate LFTs. Negative hepatitis panel. US showed fatty liver. Discussed that this increases her risk of developing chronic liver disease down the road. Plan to repeat LFTs and have her limit fatty foods. She does not have a history of alcohol use.  8. Plan to recheck her lipids. She is fasting today.  9. Recheck vitamin D level, continue taking daily vitamin D 1,000 IU.   She is aware that she needs to call and schedule mammogram and bone density.

## 2016-03-05 ENCOUNTER — Telehealth: Payer: Self-pay | Admitting: Family Medicine

## 2016-03-05 DIAGNOSIS — M6281 Muscle weakness (generalized): Secondary | ICD-10-CM | POA: Diagnosis not present

## 2016-03-05 DIAGNOSIS — G6289 Other specified polyneuropathies: Secondary | ICD-10-CM | POA: Diagnosis not present

## 2016-03-05 LAB — VITAMIN D 25 HYDROXY (VIT D DEFICIENCY, FRACTURES): Vit D, 25-Hydroxy: 33 ng/mL (ref 30–100)

## 2016-03-05 NOTE — Telephone Encounter (Signed)
Form was filled out and made copy for office and mail her a copy

## 2016-03-05 NOTE — Telephone Encounter (Signed)
Pt states that she has pubic transportation through her insurance to go to Doctor office appts but not to the store. She has someone else drive her to the store but they won't due to not being able to park in handicap for her. Please advise

## 2016-03-05 NOTE — Telephone Encounter (Signed)
Pt called and stated that while she was here yesterday, she failed to mention that she needed a parking placard. I am sending form back to be completed. Please MAIL to pt when completed.

## 2016-03-05 NOTE — Telephone Encounter (Signed)
I am not aware that she drives and stated yesterday that she takes public transportation. I need more information as to why she is requesting this.

## 2016-03-12 ENCOUNTER — Telehealth: Payer: Self-pay | Admitting: Internal Medicine

## 2016-03-12 NOTE — Telephone Encounter (Signed)
Blood sugars are   FASTING AM, 2 hours after LUNCH,2 hours after Dinner   Tuesday -  171, 295, 325 Wednesday- 154, 148,143 Thursday - 146, 149, 123 Friday- 200, 185,173 Saturday- 243, 183 161 Sunday, 163, 208, 171 Monday 03/10/16-  153 in the afternoon

## 2016-03-12 NOTE — Telephone Encounter (Signed)
Tried to call patient but number listed is not taking any phone calls right now

## 2016-03-12 NOTE — Telephone Encounter (Signed)
Her readings appear to be improving but not in goal range. Let's have her continue checking them and watching her diet. Follow up in 3 months unless her blood sugars start increasing and if she sees continues to have fasting blood sugars >180 I would like for her to come in. We may need to consider alternative treatment and referral to endocrinologist if she is willing.

## 2016-03-18 NOTE — Telephone Encounter (Signed)
Pt was notified of vickie's recommendations 

## 2016-04-05 DIAGNOSIS — M6281 Muscle weakness (generalized): Secondary | ICD-10-CM | POA: Diagnosis not present

## 2016-04-05 DIAGNOSIS — G6289 Other specified polyneuropathies: Secondary | ICD-10-CM | POA: Diagnosis not present

## 2016-04-15 DIAGNOSIS — I10 Essential (primary) hypertension: Secondary | ICD-10-CM | POA: Diagnosis not present

## 2016-04-15 DIAGNOSIS — L8995 Pressure ulcer of unspecified site, unstageable: Secondary | ICD-10-CM | POA: Diagnosis not present

## 2016-04-15 DIAGNOSIS — M4804 Spinal stenosis, thoracic region: Secondary | ICD-10-CM | POA: Diagnosis not present

## 2016-05-03 DIAGNOSIS — G6289 Other specified polyneuropathies: Secondary | ICD-10-CM | POA: Diagnosis not present

## 2016-05-03 DIAGNOSIS — M6281 Muscle weakness (generalized): Secondary | ICD-10-CM | POA: Diagnosis not present

## 2016-05-15 ENCOUNTER — Telehealth: Payer: Self-pay

## 2016-05-15 ENCOUNTER — Other Ambulatory Visit: Payer: Self-pay | Admitting: Family Medicine

## 2016-05-15 NOTE — Telephone Encounter (Signed)
Yes I am aware of this already. Pt was notified about her refills being filled and her upcoming appt

## 2016-05-15 NOTE — Telephone Encounter (Signed)
Ok to refill. She is due for a diabetes check in mid April.

## 2016-05-15 NOTE — Telephone Encounter (Signed)
Is this okay to refill? Pt was seen January 2018

## 2016-05-15 NOTE — Telephone Encounter (Signed)
In regards to last msg for refills. Pt has appt schedule 06/03/2016

## 2016-06-03 ENCOUNTER — Ambulatory Visit: Payer: Medicare Other | Admitting: Family Medicine

## 2016-06-03 DIAGNOSIS — G6289 Other specified polyneuropathies: Secondary | ICD-10-CM | POA: Diagnosis not present

## 2016-06-03 DIAGNOSIS — M6281 Muscle weakness (generalized): Secondary | ICD-10-CM | POA: Diagnosis not present

## 2016-06-30 ENCOUNTER — Encounter (HOSPITAL_COMMUNITY): Payer: Self-pay

## 2016-06-30 ENCOUNTER — Emergency Department (HOSPITAL_COMMUNITY)
Admission: EM | Admit: 2016-06-30 | Discharge: 2016-06-30 | Disposition: A | Discharge status: Home / Self Care | Payer: Medicare Other | Attending: Emergency Medicine | Admitting: Emergency Medicine

## 2016-06-30 DIAGNOSIS — R1030 Lower abdominal pain, unspecified: Secondary | ICD-10-CM | POA: Diagnosis not present

## 2016-06-30 DIAGNOSIS — Z7982 Long term (current) use of aspirin: Secondary | ICD-10-CM | POA: Diagnosis not present

## 2016-06-30 DIAGNOSIS — R1032 Left lower quadrant pain: Secondary | ICD-10-CM | POA: Diagnosis not present

## 2016-06-30 DIAGNOSIS — I10 Essential (primary) hypertension: Secondary | ICD-10-CM | POA: Diagnosis not present

## 2016-06-30 DIAGNOSIS — M7989 Other specified soft tissue disorders: Secondary | ICD-10-CM | POA: Diagnosis present

## 2016-06-30 DIAGNOSIS — R600 Localized edema: Secondary | ICD-10-CM | POA: Diagnosis not present

## 2016-06-30 DIAGNOSIS — E1140 Type 2 diabetes mellitus with diabetic neuropathy, unspecified: Secondary | ICD-10-CM | POA: Diagnosis not present

## 2016-06-30 DIAGNOSIS — Z7984 Long term (current) use of oral hypoglycemic drugs: Secondary | ICD-10-CM | POA: Diagnosis not present

## 2016-06-30 LAB — URINALYSIS, ROUTINE W REFLEX MICROSCOPIC
Bilirubin Urine: NEGATIVE
GLUCOSE, UA: 50 mg/dL — AB
Hgb urine dipstick: NEGATIVE
KETONES UR: NEGATIVE mg/dL
Nitrite: NEGATIVE
PROTEIN: NEGATIVE mg/dL
Specific Gravity, Urine: 1.015 (ref 1.005–1.030)
pH: 5 (ref 5.0–8.0)

## 2016-06-30 LAB — CBC WITH DIFFERENTIAL/PLATELET
BASOS PCT: 0 %
Basophils Absolute: 0 10*3/uL (ref 0.0–0.1)
Eosinophils Absolute: 0.2 10*3/uL (ref 0.0–0.7)
Eosinophils Relative: 2 %
HCT: 38.2 % (ref 36.0–46.0)
HEMOGLOBIN: 12.1 g/dL (ref 12.0–15.0)
LYMPHS ABS: 3 10*3/uL (ref 0.7–4.0)
LYMPHS PCT: 36 %
MCH: 28.2 pg (ref 26.0–34.0)
MCHC: 31.7 g/dL (ref 30.0–36.0)
MCV: 89 fL (ref 78.0–100.0)
MONO ABS: 0.4 10*3/uL (ref 0.1–1.0)
MONOS PCT: 5 %
NEUTROS ABS: 4.6 10*3/uL (ref 1.7–7.7)
NEUTROS PCT: 57 %
Platelets: 301 10*3/uL (ref 150–400)
RBC: 4.29 MIL/uL (ref 3.87–5.11)
RDW: 13.2 % (ref 11.5–15.5)
WBC: 8.2 10*3/uL (ref 4.0–10.5)

## 2016-06-30 LAB — COMPREHENSIVE METABOLIC PANEL
ALBUMIN: 3.7 g/dL (ref 3.5–5.0)
ALT: 31 U/L (ref 14–54)
ANION GAP: 9 (ref 5–15)
AST: 32 U/L (ref 15–41)
Alkaline Phosphatase: 92 U/L (ref 38–126)
BUN: 24 mg/dL — ABNORMAL HIGH (ref 6–20)
CALCIUM: 9.5 mg/dL (ref 8.9–10.3)
CHLORIDE: 104 mmol/L (ref 101–111)
CO2: 24 mmol/L (ref 22–32)
Creatinine, Ser: 1.13 mg/dL — ABNORMAL HIGH (ref 0.44–1.00)
GFR calc Af Amer: 57 mL/min — ABNORMAL LOW (ref >60)
GFR calc non Af Amer: 49 mL/min — ABNORMAL LOW (ref >60)
GLUCOSE: 185 mg/dL — AB (ref 65–99)
Potassium: 4.5 mmol/L (ref 3.5–5.1)
SODIUM: 137 mmol/L (ref 135–145)
Total Bilirubin: 0.2 mg/dL — ABNORMAL LOW (ref 0.3–1.2)
Total Protein: 7 g/dL (ref 6.5–8.1)

## 2016-06-30 LAB — TROPONIN I

## 2016-06-30 LAB — D-DIMER, QUANTITATIVE: D-Dimer, Quant: 0.92 ug/mL-FEU — ABNORMAL HIGH (ref 0.00–0.50)

## 2016-06-30 LAB — BRAIN NATRIURETIC PEPTIDE: B Natriuretic Peptide: 33.9 pg/mL (ref 0.0–100.0)

## 2016-06-30 MED ORDER — ENOXAPARIN SODIUM 120 MG/0.8ML ~~LOC~~ SOLN
110.0000 mg | Freq: Once | SUBCUTANEOUS | Status: AC
Start: 2016-06-30 — End: 2016-06-30
  Administered 2016-06-30: 110 mg via SUBCUTANEOUS
  Filled 2016-06-30: qty 0.8

## 2016-06-30 NOTE — ED Notes (Signed)
Asked pt if she was able to give urine sample pt states she just got back from the bathroom (in the room).  Dr Rubin PayorPickering notified

## 2016-06-30 NOTE — ED Notes (Signed)
Pt stable, states understanding of discharge instructions, daughter at bedside. 

## 2016-06-30 NOTE — ED Triage Notes (Signed)
Onset 3 days left leg and foot swelling and pain.  Pain increases with touch.  No shortness of breath.

## 2016-06-30 NOTE — Discharge Instructions (Signed)
Watch for increasing abdominal pain. Follow-up tomorrow morning for the Doppler.

## 2016-06-30 NOTE — ED Notes (Signed)
Pts daughter very unhappy, states MD told her that he wasn't sure if they were still doing studies after 7pm, unhappy that it is now 11pm and getting discharged. Daughter states they were never told that they weren't going to do it and they were under the impression that they were waiting on it.  This nurse stated that this was explained to pt when she wasn't in the room, states that we told her we were waiting on the urine sample.  Went over the outpatient doppler instructions, advised we are waiting on Lovenox to come from pharmacy still.  Family states they are unable to bring her at 8am for study and insurance requires 3 day notice for them to provide transportation. Dr. Rubin PayorPickering notified.

## 2016-06-30 NOTE — ED Provider Notes (Signed)
[facility]EM[facility]ENCY DEPT Provider Note   CSN: [id number] Arrival date & time: 06/30/16  1804     History   Chief Complaint Chief Complaint  Patient presents with  . Leg Pain    HPI Janaisa Birkland is a 66 y.o. female.  HPI  Patient presents with 3 days of left swelling and pain in her left lower extremity. Has some chronic weakness on that side. States that on the wide is she's been through rehabilitation for she is in a wheelchair most days. Now her leg is more swollen. States she cannot walk on it. More painful to touch all over. No fevers. No chest pain. No fevers. Past Medical History:  Diagnosis Date  . Diabetes mellitus without complication (HCC)   . Elevated LFTs   . Fatty liver    on [place].   Marland Kitchen Hyperlipemia   . Hypertension     Patient Active Problem List   Diagnosis Date Noted  . Estrogen deficiency 10/25/2015  . Thoracic spinal stenosis 07/06/2015  . Left leg weakness   . UTI (lower urinary tract infection) 07/04/2015  . Diabetes mellitus with neuropathy (HCC) 07/04/2015  . Essential hypertension 07/04/2015  . Morbid obesity (HCC) 07/04/2015  . Inability to walk 07/04/2015  . Weakness of left leg 07/04/2015  . Elevated LFTs 07/04/2015    Past Surgical History:  Procedure Laterality Date  . APPENDECTOMY  1969  . LUMBAR DISC SURGERY    . LUMBAR LAMINECTOMY/DECOMPRESSION MICRODISCECTOMY N/A 07/06/2015   Procedure: Thoracic ten- eleven laminectomy for spinal canal decompression;  Surgeon: Coletta Memos, MD;  Location: [facility];  Service: Orthopedics;  Laterality: N/A;    OB History    No data available       Home Medications    Prior to Admission medications   Medication Sig Start Date End Date Taking? Authorizing Provider  amLODipine (NORVASC) 10 MG tablet TAKE 1 TABLET BY MOUTH EVERY MORNING 05/15/16  Yes Henson, Vickie L, NP-C  aspirin EC 81 MG tablet Take 81 mg by mouth every evening.   Yes [provider]  Cholecalciferol (VITAMIN D PO)  Take 1 tablet by mouth daily.   Yes [provider]  gabapentin (NEURONTIN) 300 MG capsule TAKE 1 CAPSULE BY MOUTH THREE TIMES DAILY 05/15/16  Yes Henson, Vickie L, NP-C  HUMALOG KWIKPEN 100 UNIT/ML KiwkPen INJECT 18 UNITS UNDER THE SKIN THREE TIMES DAILY 05/15/16  Yes Henson, Vickie L, NP-C  hydrochlorothiazide (MICROZIDE) 12.5 MG capsule TAKE ONE CAPSULE BY MOUTH DAILY 05/15/16  Yes Henson, Vickie L, NP-C  lisinopril (PRINIVIL,ZESTRIL) 10 MG tablet TAKE 1 TABLET BY MOUTH EVERY MORNING 05/15/16  Yes Henson, Vickie L, NP-C  lovastatin (MEVACOR) 40 MG tablet TAKE 1 TABLET BY MOUTH AT BEDTIME 02/05/16  Yes Henson, Vickie L, NP-C  meloxicam (MOBIC) 7.5 MG tablet Take 7.5 mg by mouth daily as needed for pain. Reported on 09/13/2015   Yes [provider]  metFORMIN (GLUCOPHAGE) 500 MG tablet TAKE 1 TABLET BY MOUTH TWICE DAILY WITH MEALS 05/15/16  Yes Henson, Vickie L, NP-C  polyethylene glycol (MIRALAX / GLYCOLAX) packet Take 17 g by mouth daily as needed for mild constipation. 07/10/15  Yes Elgergawy, Leana Roe, MD  TOUJEO SOLOSTAR 300 UNIT/ML SOPN INJECT 45 UNITS UNDER THE SKIN DAILY 02/05/16  Yes Henson, Vickie L, NP-C  ACCU-CHEK SOFTCLIX LANCETS lancets Test 3 times daily. Pt uses accu-chek softclix lancing device 03/04/16   Henson, Vickie L, NP-C  glucose blood test strip Test 3 times daily. Dx  E11.9. Pt uses accu-chek aviva plus meter 03/04/16   Avanell Shackleton, NP-C    Family History Family History  Problem Relation Age of Onset  . Diabetes Mother   . Heart disease Mother   . Kidney disease Brother     Two brothers on ESRD    Social History Social History  Substance Use Topics  . Smoking status: Never Smoker  . Smokeless tobacco: Never Used  . Alcohol use No     Allergies   Other; Chlorhexidine; and Oxycodone hcl   Review of Systems Review of Systems  Constitutional: Negative for fatigue and fever.  HENT: Negative for congestion.   Respiratory: Negative for shortness  of breath.   Cardiovascular: Positive for leg swelling.  Gastrointestinal: Negative for abdominal pain.  Genitourinary: Negative for decreased urine volume and flank pain.  Musculoskeletal: Positive for back pain.  Skin: Negative for rash.  Neurological: Positive for weakness and numbness.     Physical Exam Updated Vital Signs BP (!) 149/59   Pulse 74   Temp 98.4 F (36.9 C) (Oral)   Resp (!) 21   Ht 5\' 4"  (1.626 m)   Wt 236 lb (107 kg)   SpO2 98%   BMI 40.51 kg/m   Physical Exam  Constitutional: She appears well-developed.  HENT:  Head: Atraumatic.  Eyes: EOM are normal.  Neck: No JVD present.  Cardiovascular: Normal rate.   Pulmonary/Chest: No respiratory distress.  Abdominal: There is tenderness.  Moderate tenderness to left lower abdomen. No rebound or guarding.  Musculoskeletal: She exhibits edema.  Some edema on bilateral lower legs but worse on the left side. Tenderness goes all the way up the leg. Edema does go somewhat more proximally than the knee also. Dorsalis pedis pulse intact bilaterally. Decreased sensation in the left lower extremity. Good capillary refill. Has increased pain with straight leg raise on the left side.  Skin: Skin is warm. Capillary refill takes less than 2 seconds.  Psychiatric: She has a normal mood and affect.     ED Treatments / Results  Labs (all labs ordered are listed, but only abnormal results are displayed) Labs Reviewed  COMPREHENSIVE METABOLIC PANEL - Abnormal; Notable for the following:       Result Value   Glucose, Bld 185 (*)    BUN 24 (*)    Creatinine, Ser 1.13 (*)    Total Bilirubin 0.2 (*)    GFR calc non Af Amer 49 (*)    GFR calc Af Amer 57 (*)    All other components within normal limits  URINALYSIS, ROUTINE W REFLEX MICROSCOPIC - Abnormal; Notable for the following:    APPearance CLOUDY (*)    Glucose, UA 50 (*)    Leukocytes, UA SMALL (*)    Bacteria, UA RARE (*)    Squamous Epithelial / LPF 6-30 (*)     All other components within normal limits  D-DIMER, QUANTITATIVE (NOT AT [facility]) - Abnormal; Notable for the following:    D-Dimer, Quant 0.92 (*)    All other components within normal limits  CBC WITH DIFFERENTIAL/PLATELET  BRAIN NATRIURETIC PEPTIDE  TROPONIN I    EKG  EKG Interpretation None       Radiology No results found.  Procedures Procedures (including critical care time)  Medications Ordered in ED Medications  enoxaparin (LOVENOX) injection 110 mg (110 mg Subcutaneous Given 06/30/16 2319)     Initial Impression / Assessment and Plan / ED Course  I have reviewed the triage  vital signs and the nursing notes.  Pertinent labs & imaging results that were available during my care of the patient were reviewed by me and considered in my medical decision making (see chart for details).  Patient with leg swelling worse in the left side. Unable to get Doppler at this time a day. Also abdominal pain. Labs overall reassuring. Urine does not show infection. Abdominal pain improved on reexamination. No chest pain. Or shortness of breath. Outpatient Doppler scheduled and given shot of Lovenox. Discharge home.  Final Clinical Impressions(s) / ED Diagnoses   Final diagnoses:  Lower extremity edema  Lower abdominal pain    New Prescriptions Discharge Medication List as of 06/30/2016 10:36 PM       Benjiman CorePickering, Courtnei Ruddell, MD 06/30/16 2355

## 2016-07-01 ENCOUNTER — Ambulatory Visit (HOSPITAL_COMMUNITY): Admission: RE | Admit: 2016-07-01 | Payer: Medicare Other | Source: Ambulatory Visit

## 2016-07-01 DIAGNOSIS — M4804 Spinal stenosis, thoracic region: Secondary | ICD-10-CM | POA: Diagnosis not present

## 2016-07-03 DIAGNOSIS — M6281 Muscle weakness (generalized): Secondary | ICD-10-CM | POA: Diagnosis not present

## 2016-07-03 DIAGNOSIS — G6289 Other specified polyneuropathies: Secondary | ICD-10-CM | POA: Diagnosis not present

## 2016-07-04 ENCOUNTER — Ambulatory Visit (HOSPITAL_COMMUNITY)
Admission: RE | Admit: 2016-07-04 | Discharge: 2016-07-04 | Disposition: A | Discharge status: Home / Self Care | Payer: Medicare Other | Source: Ambulatory Visit | Attending: Emergency Medicine | Admitting: Emergency Medicine

## 2016-07-04 DIAGNOSIS — R609 Edema, unspecified: Secondary | ICD-10-CM | POA: Diagnosis not present

## 2016-07-04 DIAGNOSIS — R600 Localized edema: Secondary | ICD-10-CM | POA: Insufficient documentation

## 2016-07-04 NOTE — Progress Notes (Signed)
*  PRELIMINARY RESULTS* Vascular Ultrasound Left lower extremity venous duplex has been completed.  Preliminary findings: No evidence of deep vein thrombosis or baker's cyst in the left lower extremity.  Mild interstitial fluid seen at distal calf.   Chauncey FischerCharlotte C Shandrika Ambers 07/04/2016, 11:34 AM

## 2016-07-17 DIAGNOSIS — G992 Myelopathy in diseases classified elsewhere: Secondary | ICD-10-CM | POA: Diagnosis not present

## 2016-07-17 DIAGNOSIS — L8995 Pressure ulcer of unspecified site, unstageable: Secondary | ICD-10-CM | POA: Diagnosis not present

## 2016-07-17 DIAGNOSIS — M4804 Spinal stenosis, thoracic region: Secondary | ICD-10-CM | POA: Diagnosis not present

## 2016-07-25 ENCOUNTER — Ambulatory Visit (INDEPENDENT_AMBULATORY_CARE_PROVIDER_SITE_OTHER): Payer: Medicare Other | Admitting: Family Medicine

## 2016-07-25 ENCOUNTER — Encounter: Payer: Self-pay | Admitting: Family Medicine

## 2016-07-25 VITALS — BP 136/78 | HR 63

## 2016-07-25 DIAGNOSIS — R262 Difficulty in walking, not elsewhere classified: Secondary | ICD-10-CM

## 2016-07-25 DIAGNOSIS — E1165 Type 2 diabetes mellitus with hyperglycemia: Secondary | ICD-10-CM

## 2016-07-25 DIAGNOSIS — I10 Essential (primary) hypertension: Secondary | ICD-10-CM | POA: Diagnosis not present

## 2016-07-25 DIAGNOSIS — E1140 Type 2 diabetes mellitus with diabetic neuropathy, unspecified: Secondary | ICD-10-CM | POA: Diagnosis not present

## 2016-07-25 DIAGNOSIS — R29898 Other symptoms and signs involving the musculoskeletal system: Secondary | ICD-10-CM

## 2016-07-25 DIAGNOSIS — Z91199 Patient's noncompliance with other medical treatment and regimen due to unspecified reason: Secondary | ICD-10-CM

## 2016-07-25 DIAGNOSIS — Z9119 Patient's noncompliance with other medical treatment and regimen: Secondary | ICD-10-CM

## 2016-07-25 DIAGNOSIS — IMO0002 Reserved for concepts with insufficient information to code with codable children: Secondary | ICD-10-CM

## 2016-07-25 LAB — POCT GLYCOSYLATED HEMOGLOBIN (HGB A1C)

## 2016-07-25 MED ORDER — ACCU-CHEK SOFTCLIX LANCETS MISC: 3 refills | Status: DC

## 2016-07-25 MED ORDER — GLUCOSE BLOOD VI STRP: ORAL_STRIP | 3 refills | Status: DC

## 2016-07-25 NOTE — Progress Notes (Signed)
Subjective:    Patient ID: Cassandra Stewart, female    DOB: 1950-11-15, 66 y.o.   MRN: [id number]  Cassandra Stewart is a 66 y.o. female who presents for follow-up of Type 2 diabetes mellitus.  Chief Complaint  Patient presents with  . 3 month follow-up    having trouble walking- leg is swelling. follow-up on DM. needs refills on mobic,    She is sitting in her motorized wheelchair. States she would like to have therapy ordered to help with weak left leg. This is chronic and ongoing. States she was improving with PT but then her insurance did not allow her to continue.  She would like a referral to podiatry to help her with foot care.   She verbalized that she does not want to talk about her diabetes and would prefer that I help her with getting PT set up and assistance with transportation. States her diabetes was under control when she lived in [place] and was able to walk and exercise. This was prior to her having back surgery and left leg weakness. States she does not eat healthy because she eats what other people bring her to eat. States she only eats 2 meals per day.    Patient is reportedly checking home blood sugars.   Home blood sugar records: BGs range between 175 and 185 fasting.  How often is blood sugars being checked: once daily  Current symptoms include: none. Patient denies foot ulcerations, hyperglycemia, hypoglycemia , increased appetite, nausea, paresthesia of the feet, polydipsia, polyuria, visual disturbances and vomiting.  Patient is checking their feet daily. Any Foot concerns (callous, ulcer, wound, thickened nails, toenail fungus, skin fungus, hammer toe): none  Last dilated eye exam: 2016 at [facility].   Current treatments: metformin 500 mg twice daily, humolog 18 units three times daily, toujeo 45 units in the morning. Later she admits to taking insulin only twice daily and occasionally missing doses.  Medication compliance: poor  Current diet: diet high in  carbs, rice, bread and candy. states she cannot go to the grocery and relies on others for her groceries  Current exercise: none Known diabetic complications: peripheral vascular disease   Saw Dr. Mikal Plane last month and is due back in September. States she was told that her back is doing fine.   The following portions of the patient's history were reviewed and updated as appropriate: allergies, current medications, past medical history, past social history and problem list.  ROS as in subjective above.     Objective:    Physical Exam Alert and in no distress.  Pharyngeal area is normal. Neck is supple without adenopathy or thyromegaly. Cardiac exam shows a regular sinus rhythm without murmurs or gallops. Lungs are clear to auscultation. LLE weaker than right. Normal sensation, cap refill, pulses and ROM. Superficial linear scratch to right posterior RLE without erythema, edema, or sign of infection.   Unable to weight patient due to her inability to stand.  Blood pressure 136/78, pulse 63.  Lab Review Diabetic Labs Latest Ref Rng & Units 07/25/2016 06/30/2016 03/04/2016 10/25/2015 09/13/2015  HbA1c - 9.6% - 10.5% 8.1% -  Microalbumin Not estab mg/dL - - - 62.1 -  Micro/Creat Ratio <30 mcg/mg creat - - - 57(H) -  Chol <200 mg/dL - - 308 657 -  HDL >84 mg/dL - - 70 67 -  Calc LDL <696 mg/dL - - 88 92 -  Triglycerides <150 mg/dL - - 91 295 -  Creatinine 0.44 - 1.00  mg/dL - 1.61(W1.13(H) 9.600.96 4.540.92 0.980.94   BP/Weight 07/25/2016 06/30/2016 03/04/2016 10/25/2015 09/13/2015  Systolic BP 136 149 132 128 128  Diastolic BP 78 59 80 82 78  Wt. (Lbs) - 236 - 236.8 -  BMI - 40.51 - 40.02 -   Foot/eye exam completion dates 07/25/2016  Foot Form Completion Done    Cassandra Stewart  reports that she has never smoked. She has never used smokeless tobacco. She reports that she does not drink alcohol or use drugs.     Assessment & Plan:    Uncontrolled type 2 diabetes with neuropathy (HCC) - Plan: HgB A1c, Ambulatory  referral to Podiatry, Ambulatory referral to Endocrinology, AMB [facility], Microalbumin / creatinine urine ratio  Essential hypertension  Weakness of left leg - Plan: AMB [facility]  Inability to walk - Plan: AMB [facility]  Personal history of noncompliance with medical treatment, presenting hazards to health  1. Rx changes: none states she cannot tolerate Metformin at a higher dose than 500 mg 2. Education: Reviewed 'ABCs' of diabetes management (respective goals in parentheses):  A1C (<7), blood pressure (<130/80), and cholesterol (LDL <100). 3. Compliance at present is estimated to be poor. Efforts to improve compliance (if necessary) will be directed at dietary modifications: cut back on carbohydrates and sugar, increased exercise, regular blood sugar monitoring: 2 times daily and will refer her to the endocrinologist to get her diabetes under better control. . 4. Follow up: 3 months for HTN, left leg weakness  5. Recommend she get a diabetic eye exam  6. Foot exam done.  7. Microalbumin/creatinine urine ordered.  8. Will refer to podiatry for foot care per patient request.  9. Referral for in-home PT made.  10. [facility] do [facility] home needs assessment and to help patient with PT set up and transportation.

## 2016-07-25 NOTE — Patient Instructions (Addendum)
The endocrinology and podiatry offices will call you.   You should also hear from [facility] to help you with needs assessment and transportation.   Call and schedule your diabetic eye exam.   Here is a list of Eye Doctors that you call and schedule an appointment with.   [facility] Address: [address], [place], [place] [address] Phone: [phone]) [phone]   [facility] Address: [address], [place], [place] [address] Phone: [phone]  Dr. Dione BoozeGroat Address: [address], [place], [place] [address] Phone: [phone]  [facility] [address] [place], [place]  [address] Telephone: [phone]) [phone]

## 2016-07-26 LAB — MICROALBUMIN / CREATININE URINE RATIO
Creatinine, Urine: 106 mg/dL (ref 20–320)
MICROALB/CREAT RATIO: 16 ug/mg{creat} (ref <30)
Microalb, Ur: 1.7 mg/dL

## 2016-07-29 ENCOUNTER — Other Ambulatory Visit: Payer: Self-pay | Admitting: *Deleted

## 2016-07-29 NOTE — Patient Outreach (Signed)
[facility]) Care Management  07/29/2016  Cassandra StalkerBarbara Stewart 28-Dec-1950 [id number]    Telephone Screen  Referral Date: 07/26/16 Referral [facility]: [facility] (Dr. Hetty BlendVickie Henson) Referral Reason: DM management, assessment, transportation issues, needs in-home PT Insurance: [facility]   Outreach attempt # 1 spoke to patient regarding referral from [facility] (Dr. Hetty BlendVickie Henson) office. HIPAA verified with patient.   Social:  Patient voiced living alone. Her daughter, ex-husband, and a friend checks on her, intermittently. Her friend assist patient with a bath 3 times/week. She doesn't have a stable caregiver. She verbalized being independent with cooking and dressing. She doesn't drive. She reported using transportation services provided [facility] for her medical appointments. Patient uses a wheelchair and rolling walker.   Conditions: Past Medical Hx: DM, Neuropathy, HTN, Weakness in Lower Extremity Patient reported moving from [place] about a year ago. During that time, she was independent with all of her ADLs. Since [place] the [place], her "DM is uncontrolled" (Hgb A1C 9.6 on 07/25/16). She can't ambulate independently due to weakness in her lower extremity. She is confined to a wheelchair. She reports that her DM is uncontrolled, because she is not able to complete her own grocery shopping. She "accepts whatever the person brings her from the grocery store". Per MD notes, patient doesn't want help to manage her DM. She wants PT to help her walk again. Patient described swelling in her lower extremity. [facility]16 for edema in her lower extremity. She had a negative Doppler study completed on 07/04/16. Patient is not aware if DM is causing her swelling and decreased ambulation.   Medications: Patient reported taking 9 meds per day. Patient reported being able to afford her medications and taking them as prescribed. Patient had no  questions or concerns about her meds.  Appointments: Patient last appointment with PCP was on 07/25/16. She is requesting an appointment with a PT to assist with helping her to ambulate again.  Advanced Directives: Patient doesn't have an Advanced Directive. She declined any information about Advanced Directives.  Consent: [facility] reviewed and discussed with husband. She agreed to services.   Plan: RN CM will send [facility] SW referral for possible assistance with community resources. RN CM advised patient that [facility] community RN CM would follow up and contact patient within the next 10 days. RN CM advised patient to contact RN CM for any needs or concerns. RN CM provided patient with [facility] 24hr Nurse Line contact info.  Wynelle ClevelandJuanita Latonyia Lopata, RN, BSN, MHA/MSL, [facility] Telephonic Care Manager Coordinator [facility] Direct Phone: [phone] Toll Free: [phone] Fax: [phone]

## 2016-07-30 ENCOUNTER — Other Ambulatory Visit: Payer: Self-pay | Admitting: Licensed Clinical Social Worker

## 2016-07-30 ENCOUNTER — Ambulatory Visit: Payer: Medicare Other | Admitting: Licensed Clinical Social Worker

## 2016-07-30 NOTE — Patient Outreach (Addendum)
[facility]N) Care Management  07/30/2016  Cassandra StalkerBarbara Stewart 1950/12/19 [id number]  Assessment- CSW completed outreach call to patient after receiving new referral on 07/29/16. Patient answered and provided HIPPA verifications. CSW introduced self, reason for call and of [facility] social work services. Patient is agreeable to social work assistance. Patient shares that her main goal is to gain physical therapy and to start walking again. Patient shares that main social work need she has is transportation. She reports that she gets 12 round trips per year through her insurance. CSW educated her on available transportation resources and she is interested in gaining SCAT. Patient denies needing mental health resources or advance directives. Patient shares that she already gets [employer]. CSW questioned if she had thought about gaining an aide through Medicaid to assist her with her daily needs but patient reports that she does not receive full Medicaid benefits and that they only pay for her insurance premium. CSW scheduled home visit for 08/01/16.  Plan-CSW will complete home visit this week and will send involvement letter to PCP.  [name] Lativia Velie, BSW, MSW, LCSW [facility] Care Management [name] Phone: [phone] Fax: [phone]

## 2016-08-01 ENCOUNTER — Other Ambulatory Visit: Payer: Self-pay | Admitting: Licensed Clinical Social Worker

## 2016-08-01 NOTE — Patient Outreach (Signed)
[facility]N) Care Management  [facility]  08/01/2016  Cassandra StalkerBarbara Stewart 09-05-1950 [id number]  Encounter Medications:  Outpatient Encounter Prescriptions as of 08/01/2016  Medication Sig  . ACCU-CHEK SOFTCLIX LANCETS lancets Test 3 times daily. Pt uses accu-chek softclix lancing device  . amLODipine (NORVASC) 10 MG tablet TAKE 1 TABLET BY MOUTH EVERY MORNING  . aspirin EC 81 MG tablet Take 81 mg by mouth every evening.  . Cholecalciferol (VITAMIN D PO) Take 1 tablet by mouth daily.  Marland Kitchen. gabapentin (NEURONTIN) 300 MG capsule TAKE 1 CAPSULE BY MOUTH THREE TIMES DAILY  . glucose blood test strip Test 3 times daily. Dx E11.9. Pt uses accu-chek aviva plus meter  . HUMALOG KWIKPEN 100 UNIT/ML KiwkPen INJECT 18 UNITS UNDER THE SKIN THREE TIMES DAILY  . hydrochlorothiazide (MICROZIDE) 12.5 MG capsule TAKE ONE CAPSULE BY MOUTH DAILY  . lisinopril (PRINIVIL,ZESTRIL) 10 MG tablet TAKE 1 TABLET BY MOUTH EVERY MORNING  . lovastatin (MEVACOR) 40 MG tablet TAKE 1 TABLET BY MOUTH AT BEDTIME  . meloxicam (MOBIC) 7.5 MG tablet Take 7.5 mg by mouth daily as needed for pain. Reported on 09/13/2015  . metFORMIN (GLUCOPHAGE) 500 MG tablet TAKE 1 TABLET BY MOUTH TWICE DAILY WITH MEALS  . polyethylene glycol (MIRALAX / GLYCOLAX) packet Take 17 g by mouth daily as needed for mild constipation. (Patient not taking: Reported on 07/25/2016)  . TOUJEO SOLOSTAR 300 UNIT/ML SOPN INJECT 45 UNITS UNDER THE SKIN DAILY   No facility-administered encounter medications on file as of 08/01/2016.     Functional Status:  In your present state of health, do you have any difficulty performing the following activities: 08/01/2016 10/25/2015  Hearing? N N  Vision? N N  Difficulty concentrating or making decisions? N N  Walking or climbing stairs? Y Y  Dressing or bathing? Y N  Doing errands, shopping? Y N  Some recent data might be hidden    Fall/Depression Screening:  PHQ 2/9 Scores 08/01/2016 07/29/2016  10/25/2015  PHQ - 2 Score 0 0 0    Assessment: CSW completed initial home visit on 08/01/16. Patient is a 66 year old female who lives alone and is in need of community resource support. Patient has DM, Neuropathy, HTN and Weakness in Lower Extremity. Patient shares that she moved to [place] from [place] about a year ago and really likes where her residence is located as well as her neighbors. Patient is unable to ambulate independently due to weakness in her lower extremity. Patient is confined to a wheelchair. Patient reports that her daughter, ex-husband and friend check on her. She shares that her friend comes 3x per week to assist her with bathing. Patient states that her daughter goes grocery shopping for her as well but that she is wanting to do that on her own so that she can pick the foods that she wants. Patient currently receives [employer] as well. Patient received a phone call during home visit and patient informed CSW that it was her neighbor that calls her daily to check on her. Patient reports having a strong support system. Patient denies wishing to complete advance directives. She denies experiencing any depressive symptoms. Patient shares that her monthly income is $1,200 per month and that she has Medicaid MQB. CSW provided personal care resource information, senior resources and Web designertransportation resource handouts. Patient would not be able to gain personal care aide through [facility] because she does not have Full Adult Medicaid. Patient is not interested in private pay  caregivers at this time and denies wanting to be put on the wait list for In Home Aide Services through DSS. However, patient states interest in both [employer] and [employer]. Patient reports that her insurance pays for [employer] program and that she would consider going to this once she gains transportation. Patient currently receives 12 round trips for medical appointments only through her  insurance but states that this will soon run out. CSW reviewed transportation resources and patient wishes to gain SCAT services. CSW complete entire application. CSW educated her on the application program process. Patient reports that gaining stable transportation will allow her to become more independent and assist her overall well-being. Patient appreciative of social work assistance.   Plan-CSW successfully faxed completed SCAT application to SCAT office today on 08/01/16. Patient is aware that CSW will be out of the office next week and will follow up within 3-4 weeks. CSW will route encounter to PCP.   [name] La, BSW, MSW, LCSW [email or web address] Phone: [phone] Fax: [phone]

## 2016-08-02 ENCOUNTER — Other Ambulatory Visit: Payer: Self-pay

## 2016-08-02 NOTE — Patient Outreach (Signed)
    Unsuccessful attempt made to contact patient via telephone. HIPPA compliant message left with request for return call.  Plan: Make another attempt to contact patient  Via telephone in the next 21 days.

## 2016-08-03 DIAGNOSIS — G6289 Other specified polyneuropathies: Secondary | ICD-10-CM | POA: Diagnosis not present

## 2016-08-03 DIAGNOSIS — M6281 Muscle weakness (generalized): Secondary | ICD-10-CM | POA: Diagnosis not present

## 2016-08-05 ENCOUNTER — Telehealth: Payer: Self-pay | Admitting: Internal Medicine

## 2016-08-05 ENCOUNTER — Other Ambulatory Visit: Payer: Self-pay

## 2016-08-05 DIAGNOSIS — M79606 Pain in leg, unspecified: Secondary | ICD-10-CM

## 2016-08-05 DIAGNOSIS — M7989 Other specified soft tissue disorders: Secondary | ICD-10-CM

## 2016-08-05 NOTE — Telephone Encounter (Signed)
Please refer her to ortho as she requests. Thanks.

## 2016-08-05 NOTE — Telephone Encounter (Signed)
We discussed the possibility of her seeing an orthopedist vs doing more physical therapy and decided to order PT. I am not sure that an orthopedist will be able to add anything at this point. If she wants a referral, then we can refer her but I think she will benefit from PT right now. I made a referral for PT and they should be contacting her. I also referred [facility] to [facility] and she can give them a call if she has not heard from them.

## 2016-08-05 NOTE — Patient Outreach (Signed)
[facility]) Care Management  08/05/2016   Cassandra Stewart 1951/01/10 [id number]  Subjective:  My diabetes is not doing so good. I have had a few falls in the last few months  Objective:  Telephonic assessment.  Current Medications:   Current Outpatient Prescriptions  Medication Sig Dispense Refill  . ACCU-CHEK SOFTCLIX LANCETS lancets Test 3 times daily. Pt uses accu-chek softclix lancing device 200 each 3  . amLODipine (NORVASC) 10 MG tablet TAKE 1 TABLET BY MOUTH EVERY MORNING 90 tablet 0  . aspirin EC 81 MG tablet Take 81 mg by mouth every evening.    . Cholecalciferol (VITAMIN D PO) Take 1 tablet by mouth daily.    Marland Kitchen gabapentin (NEURONTIN) 300 MG capsule TAKE 1 CAPSULE BY MOUTH THREE TIMES DAILY 270 capsule 0  . glucose blood test strip Test 3 times daily. Dx E11.9. Pt uses accu-chek aviva plus meter 200 each 3  . HUMALOG KWIKPEN 100 UNIT/ML KiwkPen INJECT 18 UNITS UNDER THE SKIN THREE TIMES DAILY 45 mL 0  . hydrochlorothiazide (MICROZIDE) 12.5 MG capsule TAKE ONE CAPSULE BY MOUTH DAILY 90 capsule 0  . lisinopril (PRINIVIL,ZESTRIL) 10 MG tablet TAKE 1 TABLET BY MOUTH EVERY MORNING 90 tablet 0  . lovastatin (MEVACOR) 40 MG tablet TAKE 1 TABLET BY MOUTH AT BEDTIME 90 tablet 1  . meloxicam (MOBIC) 7.5 MG tablet Take 7.5 mg by mouth daily as needed for pain. Reported on 09/13/2015    . metFORMIN (GLUCOPHAGE) 500 MG tablet TAKE 1 TABLET BY MOUTH TWICE DAILY WITH MEALS 180 tablet 0  . polyethylene glycol (MIRALAX / GLYCOLAX) packet Take 17 g by mouth daily as needed for mild constipation. (Patient not taking: Reported on 07/25/2016) 14 each 0  . TOUJEO SOLOSTAR 300 UNIT/ML SOPN INJECT 45 UNITS UNDER THE SKIN DAILY 22.5 pen 1   No current facility-administered medications for this visit.     Functional Status:  In your present state of health, do you have any difficulty performing the following activities: 08/05/2016 08/01/2016  Hearing? N N  Vision? Y N   Difficulty concentrating or making decisions? N N  Walking or climbing stairs? Y Y  Dressing or bathing? Y Y  Doing errands, shopping? Cassandra Stewart  Preparing Food and eating ? Y -  Using the Toilet? N -  In the past six months, have you accidently leaked urine? N -  Do you have problems with loss of bowel control? N -  Managing your Medications? N -  Managing your Finances? N -  Housekeeping or managing your Housekeeping? Y -  Some recent data might be hidden    Fall/Depression Screening: Fall Risk  08/05/2016 07/29/2016 10/25/2015  Falls in the past year? Yes Yes No  Number falls in past yr: 2 or more 2 or more -  Injury with Fall? No No -  Risk Factor Category  High Fall Risk High Fall Risk -  Risk for fall due to : History of fall(s);Impaired balance/gait;Impaired vision;Medication side effect;Impaired mobility Impaired mobility;Impaired balance/gait;History of fall(s) -  Follow up Falls prevention discussed;Follow up appointment Falls evaluation completed -   PHQ 2/9 Scores 08/05/2016 08/01/2016 07/29/2016 10/25/2015  PHQ - 2 Score 0 0 0 0   Fall Risk  08/05/2016 07/29/2016 10/25/2015  Falls in the past year? Yes Yes No  Number falls in past yr: 2 or more 2 or more -  Injury with Fall? No No -  Risk Factor Category  High Fall Risk High Fall Risk -  Risk for fall due to : History of fall(s);Impaired balance/gait;Impaired vision;Medication side effect;Impaired mobility Impaired mobility;Impaired balance/gait;History of fall(s) -  Follow up Falls prevention discussed;Follow up appointment Falls evaluation completed -   [facility] CM Care Plan Problem One     Most Recent Value  Care Plan Problem One  patinent lacks knowledge related to diabetes   Role Documenting the Problem One  Care Management Coordinator  Care Plan for Problem One  Active  THN Long Term Goal   In the next 31 days, patient will be able to create a low carbohydrate meal plan  [facility]N Long Term Goal Start Date  08/05/16  Interventions for  Problem One Long Term Goal  initial telephone contact to assess patients needs for diabetes education  [facility] CM Short Term Goal #1   In the next 28 days, patient will meet with [facility] RNCM for diabetes education  [facility] CM Short Term Goal #1 Start Date  08/05/16  Interventions for Short Term Goal #1  During this initial telephone assessment, patient adviised this RNCM she needed diabetes education    [facility] CM Care Plan Problem Two     Most Recent Value  Care Plan Problem Two  patient had more than one fall in the last year  Role Documenting the Problem Two  Care Management Coordinator  Care Plan for Problem Two  Active  Interventions for Problem Two Long Term Goal   initial assessmnet indicates patient has had 2 or more falls in the last 12 months  [facility]N Long Term Goal  In the next 31 days, patient will meet with RNCM for fall prevention education  [facility] Long Term Goal Start Date  08/05/16     Assessment:  [facility] for chronic disease and fall prevention education. Patient has history of uncontrol diabetes, report need for diabetes education.  Plan:  Home visit later this month for chronic disease and fall prevention education. Home visit to assess need for community resource referral

## 2016-08-05 NOTE — Telephone Encounter (Signed)
Pt called and left a message stating that she was suppose to be referred to Ortho for her leg pain. Nothing is mentioned on this and no referral was done. You wanted PT for in home and we [facility]. Pt is also calling about more in home therapy to be set up. Looks like [facility] but pt never called them back. Please advise

## 2016-08-05 NOTE — Telephone Encounter (Signed)
Pt states she still wants to go to Ortho to find out whats wrong with her because she is having swelling in her leg too. I also had to call back and leave a vm on pt's phone the [facility] as she did not have a pen to write down the number when I was talking to her

## 2016-08-05 NOTE — Telephone Encounter (Signed)
Put referral into epic. They will contact patient

## 2016-08-08 ENCOUNTER — Other Ambulatory Visit: Payer: Self-pay

## 2016-08-08 ENCOUNTER — Telehealth: Payer: Self-pay | Admitting: Family Medicine

## 2016-08-08 NOTE — Telephone Encounter (Signed)
P[facility] faulkner [facility] requesting [facility] and PT for pt and the pt would like [facility] to come out, verbal orders given by Dr Susann Givenslalonde,

## 2016-08-08 NOTE — Patient Outreach (Signed)
[facility](THN) Care Management   08/08/2016  Cassandra StalkerBarbara Stewart Aug 11, 1950 [id number]  Cassandra StalkerBarbara Stewart is an 66 y.o. female  Subjective:  I have very limited support. I live by myself, a friend comes over to help me sometimes.  Objective:   ROS Well dressed, well nourished elderly lady using wheelchair to mobilize  Physical Exam  ROS  Encounter Medications:   Outpatient Encounter Prescriptions as of 08/08/2016  Medication Sig  . ACCU-CHEK SOFTCLIX LANCETS lancets Test 3 times daily. Pt uses accu-chek softclix lancing device  . amLODipine (NORVASC) 10 MG tablet TAKE 1 TABLET BY MOUTH EVERY MORNING  . aspirin EC 81 MG tablet Take 81 mg by mouth every evening.  . Cholecalciferol (VITAMIN D PO) Take 1 tablet by mouth daily.  Marland Kitchen. gabapentin (NEURONTIN) 300 MG capsule TAKE 1 CAPSULE BY MOUTH THREE TIMES DAILY  . glucose blood test strip Test 3 times daily. Dx E11.9. Pt uses accu-chek aviva plus meter  . HUMALOG KWIKPEN 100 UNIT/ML KiwkPen INJECT 18 UNITS UNDER THE SKIN THREE TIMES DAILY  . hydrochlorothiazide (MICROZIDE) 12.5 MG capsule TAKE ONE CAPSULE BY MOUTH DAILY  . lisinopril (PRINIVIL,ZESTRIL) 10 MG tablet TAKE 1 TABLET BY MOUTH EVERY MORNING  . lovastatin (MEVACOR) 40 MG tablet TAKE 1 TABLET BY MOUTH AT BEDTIME  . meloxicam (MOBIC) 7.5 MG tablet Take 7.5 mg by mouth daily as needed for pain. Reported on 09/13/2015  . metFORMIN (GLUCOPHAGE) 500 MG tablet TAKE 1 TABLET BY MOUTH TWICE DAILY WITH MEALS  . polyethylene glycol (MIRALAX / GLYCOLAX) packet Take 17 g by mouth daily as needed for mild constipation. (Patient not taking: Reported on 07/25/2016)  . TOUJEO SOLOSTAR 300 UNIT/ML SOPN INJECT 45 UNITS UNDER THE SKIN DAILY   No facility-administered encounter medications on file as of 08/08/2016.     Functional Status:   In your present state of health, do you have any difficulty performing the following activities: 08/05/2016 08/01/2016  Hearing? N N  Vision? Y N   Difficulty concentrating or making decisions? N N  Walking or climbing stairs? Y Y  Dressing or bathing? Y Y  Doing errands, shopping? Malvin JohnsY Y  Preparing Food and eating ? Y -  Using the Toilet? N -  In the past six months, have you accidently leaked urine? N -  Do you have problems with loss of bowel control? N -  Managing your Medications? N -  Managing your Finances? N -  Housekeeping or managing your Housekeeping? Y -  Some recent data might be hidden    Fall/Depression Screening:    Fall Risk  08/05/2016 07/29/2016 10/25/2015  Falls in the past year? Yes Yes No  Number falls in past yr: 2 or more 2 or more -  Injury with Fall? No No -  Risk Factor Category  High Fall Risk High Fall Risk -  Risk for fall due to : History of fall(s);Impaired balance/gait;Impaired vision;Medication side effect;Impaired mobility Impaired mobility;Impaired balance/gait;History of fall(s) -  Follow up Falls prevention discussed;Follow up appointment Falls evaluation completed -   PHQ 2/9 Scores 08/05/2016 08/01/2016 07/29/2016 10/25/2015  PHQ - 2 Score 0 0 0 0   Depression screen [facility] 08/05/2016 08/01/2016 07/29/2016 10/25/2015  Decreased Interest 0 0 0 0  Down, Depressed, Hopeless 0 0 0 0  PHQ - 2 Score 0 0 0 0    THN CM Care Plan Problem One     Most Recent Value  Care Plan Problem One  patinent lacks knowledge  related to diabetes   Role Documenting the Problem One  Care Management Coordinator  Care Plan for Problem One  Active  THN Long Term Goal   In the next 31 days, patient will be able to create a low carbohydrate meal plan  THN Long Term Goal Start Date  08/05/16  Interventions for Problem One Long Term Goal  home visit to assess patient's knowledge related to low carbohydrate diets.  Patient reports having diabetes education over 10 years a go  THN CM Short Term Goal #1   In the next 28 days, patient will meet with [facility] RNCM for diabetes education  [facility] CM Short Term Goal #1 Start Date  08/05/16   Interventions for Short Term Goal #1  initial home visit to assess educational needs for diabetes management.      THN CM Care Plan Problem Two     Most Recent Value  Care Plan Problem Two  patient had more than one fall in the last year  Role Documenting the Problem Two  Care Management Coordinator  Care Plan for Problem Two  Active  Interventions for Problem Two Long Term Goal   patient has limited use of her left lower extremity.  RNCM obtained order for HHPT/OT to assist patient in reaching her optimal level of functioning.  THN Long Term Goal  In the next 31 days, patient will meet with RNCM for fall prevention education  [facility] Long Term Goal Start Date  08/05/16     Fall Risk  08/05/2016 07/29/2016 10/25/2015  Falls in the past year? Yes Yes No  Number falls in past yr: 2 or more 2 or more -  Injury with Fall? No No -  Risk Factor Category  High Fall Risk High Fall Risk -  Risk for fall due to : History of fall(s);Impaired balance/gait;Impaired vision;Medication side effect;Impaired mobility Impaired mobility;Impaired balance/gait;History of fall(s) -  Follow up Falls prevention discussed;Follow up appointment Falls evaluation completed -    Assessment:   Referral received for diabetes education, assessment of community care coordination needs. Patient reports she moved to the area about 10 years ago from [place]. 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 Patient states she has been to a couple of skilled nursing facilities for rehabilitation, however she continues to have left lower extremity weakness. Patient reports having Diabetes Education almost 20 years ago. Patient's current hgA1C is 9.0. Patient reports once a day testing, has a meter that is one year old. RNCM made call to primary care provider's office, received order for HHPT/OT from Dr. Maryagnes Amos,  MD. [facility] for [facility]/OT Services for evaluation, recommendation for treatment.   Plan:  Telephone contact to assess patient's progress in meeting her case management goals

## 2016-08-09 ENCOUNTER — Telehealth: Payer: Self-pay

## 2016-08-09 NOTE — Telephone Encounter (Signed)
Darl PikesSusan, RN with [facility] called to let you know that they will not be able to start care for this patient until Monday. CB # [phone]. [name]/RLB

## 2016-08-12 ENCOUNTER — Ambulatory Visit (INDEPENDENT_AMBULATORY_CARE_PROVIDER_SITE_OTHER): Payer: Medicare Other | Admitting: Physician Assistant

## 2016-08-12 DIAGNOSIS — R29898 Other symptoms and signs involving the musculoskeletal system: Secondary | ICD-10-CM | POA: Diagnosis not present

## 2016-08-12 NOTE — Progress Notes (Signed)
Office Visit Note   Patient: Cassandra Stewart           Date of Birth: [birth year]           MRN: [id number] Visit Date: 08/12/2016              Requested by: Avanell Shackleton, NP-C [address] [place], [place] [address] PCP: Avanell Shackleton, NP-C   Assessment & Plan: Visit Diagnoses:  1. Weakness of left lower extremity     Plan: Recommend that she continue therapy to work on strengthening left leg. Also would recommend if she is not seen by neurologist has to follow cultures. She may benefit from EMG nerve /conduction studies and she states this worked him back in 2010 to gain of disability she's had none since she had weakness in the left leg and she is unsure of the results of the EMG /nerve conduction studies past. Otherwise we'll see her back on an as-needed basis  Follow-Up Instructions: Return if symptoms worsen or fail to improve.   Orders:  No orders of the defined types were placed in this encounter.  No orders of the defined types were placed in this encounter.     Procedures: No procedures performed   Clinical Data: No additional findings.   Subjective: No chief complaint on file.   HPI  Cassandra Stewart is a 66 year old female were seen at the request Hetty Blend in NP-C. Patient seen for left ankle swelling left knee numbness tingling and weakness of the left leg. Patient has a history of diabetes mellitus since the age of 39. She states she has neuropathy. She also has hypertension and hyperlipidemia. She started having weakness in her left leg back in the fall of 2016. She is now basically wheelchair bound. She states she had some back issues back in 2010 actually went underwent EMG nerve conduction studies due to the fact that she was unable to walk for prolonged period time and did get disability. Results the EMG nerve conduction studies not available. She was admitted to [facility] February 2017 had an extensive workup for weakness with a  reported negative MRI for any brain involvement also MRI of her spine and multiple x-rays that were remarkable per the patient. [facility] to [facility] rehabilitation stay there for 21 days. Then with [facility] health care rehabilitation. She does feel therapy has helped some. In May 2017 she underwent a thoracic 1011 laminectomy for spinal Canal decompression.  She does ambulate some with the use of a walker. Otherwise deniesany other weakness in other extremities.  Review of Systems Left leg weakness without injury. Decreased sensation left lower leg and foot  Objective: Vital Signs: There were no vitals taken for this visit.  Physical Exam  Constitutional: She is oriented to person, place, and time. She appears well-developed and well-nourished. No distress.  Pulmonary/Chest: Effort normal.  Neurological: She is alert and oriented to person, place, and time.  Skin: She is not diaphoretic.  Psychiatric: She has a normal mood and affect. Her behavior is normal.    Ortho Exam Good range of motion of her hip is bilaterally without pain. Good range of motion with left knee. She has 5 out of showing some lower extremities against resistance except for left hip flexor which is 3/5 strength. Calf supple nontender bilaterally. She is subjective decreased sensation throughout the left lower leg from the knee and down into the foot. Specialty Comments:  No specialty comments available.  Imaging: No results  found.   PMFS History: Patient Active Problem List   Diagnosis Date Noted  . Estrogen deficiency 10/25/2015  . Thoracic spinal stenosis 07/06/2015  . UTI (lower urinary tract infection) 07/04/2015  . Uncontrolled type 2 diabetes with neuropathy (HCC) 07/04/2015  . Essential hypertension 07/04/2015  . Morbid obesity (HCC) 07/04/2015  . Inability to walk 07/04/2015  . Weakness of left leg 07/04/2015  . Elevated LFTs 07/04/2015   Past Medical History:  Diagnosis Date  . Elevated LFTs     . Fatty liver    on US.   Marland Kitchen. Hyperlipemia   . Hypertension   . Uncontrolled type 2 diabetes with neuropathy (HCC)     Family History  Problem Relation Age of Onset  . Diabetes Mother   . Heart disease Mother   . Kidney disease Brother        Two brothers on ESRD    Past Surgical History:  Procedure Laterality Date  . APPENDECTOMY  1969  . LUMBAR DISC SURGERY    . LUMBAR LAMINECTOMY/DECOMPRESSION MICRODISCECTOMY N/A 07/06/2015   Procedure: Thoracic ten- eleven laminectomy for spinal canal decompression;  Surgeon: Coletta MemosKyle Cabbell, MD;  Location: [facility];  Service: Orthopedics;  Laterality: N/A;   Social History   Occupational History  . Not on file.   Social History Main Topics  . Smoking status: Never Smoker  . Smokeless tobacco: Never Used  . Alcohol use No  . Drug use: No  . Sexual activity: Not Currently

## 2016-08-15 ENCOUNTER — Telehealth: Payer: Self-pay | Admitting: Family Medicine

## 2016-08-15 ENCOUNTER — Other Ambulatory Visit: Payer: Self-pay

## 2016-08-15 NOTE — Patient Outreach (Signed)
[facility]) Care Management  08/15/2016   Cassandra Stewart [birth year] [id number]  Subjective:  I have not heard from the home health agency yet.  Objective:  Telephonic contact with patient to follow up with referral made for home health PT/OT.  Current Medications:  Current Outpatient Prescriptions  Medication Sig Dispense Refill  . ACCU-CHEK SOFTCLIX LANCETS lancets Test 3 times daily. Pt uses accu-chek softclix lancing device 200 each 3  . amLODipine (NORVASC) 10 MG tablet TAKE 1 TABLET BY MOUTH EVERY MORNING 90 tablet 0  . aspirin EC 81 MG tablet Take 81 mg by mouth every evening.    . Cholecalciferol (VITAMIN D PO) Take 1 tablet by mouth daily.    Marland Kitchen gabapentin (NEURONTIN) 300 MG capsule TAKE 1 CAPSULE BY MOUTH THREE TIMES DAILY 270 capsule 0  . glucose blood test strip Test 3 times daily. Dx E11.9. Pt uses accu-chek aviva plus meter 200 each 3  . HUMALOG KWIKPEN 100 UNIT/ML KiwkPen INJECT 18 UNITS UNDER THE SKIN THREE TIMES DAILY 45 mL 0  . hydrochlorothiazide (MICROZIDE) 12.5 MG capsule TAKE ONE CAPSULE BY MOUTH DAILY 90 capsule 0  . lisinopril (PRINIVIL,ZESTRIL) 10 MG tablet TAKE 1 TABLET BY MOUTH EVERY MORNING 90 tablet 0  . lovastatin (MEVACOR) 40 MG tablet TAKE 1 TABLET BY MOUTH AT BEDTIME 90 tablet 1  . meloxicam (MOBIC) 7.5 MG tablet Take 7.5 mg by mouth daily as needed for pain. Reported on 09/13/2015    . metFORMIN (GLUCOPHAGE) 500 MG tablet TAKE 1 TABLET BY MOUTH TWICE DAILY WITH MEALS 180 tablet 0  . polyethylene glycol (MIRALAX / GLYCOLAX) packet Take 17 g by mouth daily as needed for mild constipation. (Patient not taking: Reported on 07/25/2016) 14 each 0  . TOUJEO SOLOSTAR 300 UNIT/ML SOPN INJECT 45 UNITS UNDER THE SKIN DAILY 22.5 pen 1   No current facility-administered medications for this visit.     Functional Status:  In your present state of health, do you have any difficulty performing the following activities: 08/05/2016 08/01/2016   Hearing? N N  Vision? Y N  Difficulty concentrating or making decisions? N N  Walking or climbing stairs? Y Y  Dressing or bathing? Y Y  Doing errands, shopping? Cassandra Stewart  Preparing Food and eating ? Y -  Using the Toilet? N -  In the past six months, have you accidently leaked urine? N -  Do you have problems with loss of bowel control? N -  Managing your Medications? N -  Managing your Finances? N -  Housekeeping or managing your Housekeeping? Y -  Some recent data might be hidden    Fall/Depression Screening: Fall Risk  08/05/2016 07/29/2016 10/25/2015  Falls in the past year? Yes Yes No  Number falls in past yr: 2 or more 2 or more -  Injury with Fall? No No -  Risk Factor Category  High Fall Risk High Fall Risk -  Risk for fall due to : History of fall(s);Impaired balance/gait;Impaired vision;Medication side effect;Impaired mobility Impaired mobility;Impaired balance/gait;History of fall(s) -  Follow up Falls prevention discussed;Follow up appointment Falls evaluation completed -   PHQ 2/9 Scores 08/05/2016 08/01/2016 07/29/2016 10/25/2015  PHQ - 2 Score 0 0 0 0   THN CM Care Plan Problem One     Most Recent Value  Care Plan Problem One  patinent lacks knowledge related to diabetes   Role Documenting the Problem One  Care Management Coordinator  Care Plan for Problem One  Active  THN Long Term Goal   In the next 31 days, patient will be able to create a low carbohydrate meal plan  THN Long Term Goal Start Date  08/05/16  Interventions for Problem One Long Term Goal  6/21 patient states she has reviewed diabetes information provided by this RNCM, would be able to create a low carbohydrate reakfast  [facility]N CM Short Term Goal #1   In the next 28 days, [facility]  [facility] Goal #1 Start Date  08/05/16  Interventions for Short Term Goal #1  6/21 Patient and RNCM agreed to home viisit in the next 28 days for diabetes educaiton    [facility] Care  Plan Problem Two     Most Recent Value  Care Plan Problem Two  patient had more than one fall in the last year  Role Documenting the Problem Two  Care Management Coordinator  Care Plan for Problem Two  Active  Interventions for Problem Two Long Term Goal   6/21 patient agreed to home visit in the next 28 days for fall prevention education  THN Long Term Goal  In the next 31 days, patient will meet with RNCM for fall prevention education  [facility] Long Term Goal Start Date  08/05/16     Assessment:  Patient reports not being contacted by home health agency after referral. Call made to [facility], spoke with Christiana who stated they had [facility] [place] [facility] on Monday, June 18. Christiana stated she made one attempt to contact patient, however, she did not get an answer This RNCM requested someone from the office make another attempt to contact patient as it has been 7 days since referral sent for services. Trula OreChristina advises this RNCM she would call patient after hanging up. RNCM called patient after 16 minutes, was told by patient appointment had been made by agency to send intake nurse out on Sunday, June 24.  Plan:  Home visit in the next 21 days for home visit for diabetes educaiton

## 2016-08-15 NOTE — Telephone Encounter (Signed)
[facility] called and wanted to let you know that they will be going out on Sunday to start PT on her for home health services,

## 2016-08-16 ENCOUNTER — Other Ambulatory Visit: Payer: Self-pay | Admitting: Licensed Clinical Social Worker

## 2016-08-16 NOTE — Patient Outreach (Signed)
[facility]N) Care Management  08/16/2016  Cassandra StalkerBarbara Stewart 05/17/1950 [id number]  Assessment- CSW completed outreach call to patient and she answered. CSW questioned if she had heard from [facility] yet in order to schedule intake appointment and declined. CSW informed her that she will be in touch with her after she contacts [facility]. Patient agreeable. CSW contacted [facility] and spoke to staff. They confirmed that they received her application and stated that they will be contacting patient within the next week to schedule intake appointment. CSW thanked them for update. CSW contacted patient and provide update. She expressed understanding.  Plan-CSW will continue to provide social work assistance and ensure that patient gains stable transportation through [facility] services.  [name] Mikaili Flippin, BSW, MSW, LCSW [facility] Care Management [email or web address] Phone: [phone] Fax: [phone]

## 2016-08-17 DIAGNOSIS — M4804 Spinal stenosis, thoracic region: Secondary | ICD-10-CM | POA: Diagnosis not present

## 2016-08-18 DIAGNOSIS — Z9181 History of falling: Secondary | ICD-10-CM | POA: Diagnosis not present

## 2016-08-18 DIAGNOSIS — I10 Essential (primary) hypertension: Secondary | ICD-10-CM | POA: Diagnosis not present

## 2016-08-18 DIAGNOSIS — M4804 Spinal stenosis, thoracic region: Secondary | ICD-10-CM | POA: Diagnosis not present

## 2016-08-18 DIAGNOSIS — K760 Fatty (change of) liver, not elsewhere classified: Secondary | ICD-10-CM | POA: Diagnosis not present

## 2016-08-18 DIAGNOSIS — E1140 Type 2 diabetes mellitus with diabetic neuropathy, unspecified: Secondary | ICD-10-CM | POA: Diagnosis not present

## 2016-08-18 DIAGNOSIS — Z794 Long term (current) use of insulin: Secondary | ICD-10-CM | POA: Diagnosis not present

## 2016-08-18 DIAGNOSIS — Z87440 Personal history of urinary (tract) infections: Secondary | ICD-10-CM | POA: Diagnosis not present

## 2016-08-19 DIAGNOSIS — Z9181 History of falling: Secondary | ICD-10-CM | POA: Diagnosis not present

## 2016-08-19 DIAGNOSIS — M4804 Spinal stenosis, thoracic region: Secondary | ICD-10-CM | POA: Diagnosis not present

## 2016-08-19 DIAGNOSIS — I10 Essential (primary) hypertension: Secondary | ICD-10-CM | POA: Diagnosis not present

## 2016-08-19 DIAGNOSIS — Z87440 Personal history of urinary (tract) infections: Secondary | ICD-10-CM | POA: Diagnosis not present

## 2016-08-19 DIAGNOSIS — Z794 Long term (current) use of insulin: Secondary | ICD-10-CM | POA: Diagnosis not present

## 2016-08-19 DIAGNOSIS — K760 Fatty (change of) liver, not elsewhere classified: Secondary | ICD-10-CM | POA: Diagnosis not present

## 2016-08-19 DIAGNOSIS — E1140 Type 2 diabetes mellitus with diabetic neuropathy, unspecified: Secondary | ICD-10-CM | POA: Diagnosis not present

## 2016-08-20 ENCOUNTER — Telehealth: Payer: Self-pay | Admitting: Internal Medicine

## 2016-08-20 DIAGNOSIS — E1140 Type 2 diabetes mellitus with diabetic neuropathy, unspecified: Secondary | ICD-10-CM | POA: Diagnosis not present

## 2016-08-20 DIAGNOSIS — K760 Fatty (change of) liver, not elsewhere classified: Secondary | ICD-10-CM | POA: Diagnosis not present

## 2016-08-20 DIAGNOSIS — Z794 Long term (current) use of insulin: Secondary | ICD-10-CM | POA: Diagnosis not present

## 2016-08-20 DIAGNOSIS — Z9181 History of falling: Secondary | ICD-10-CM | POA: Diagnosis not present

## 2016-08-20 DIAGNOSIS — Z87440 Personal history of urinary (tract) infections: Secondary | ICD-10-CM | POA: Diagnosis not present

## 2016-08-20 DIAGNOSIS — M4804 Spinal stenosis, thoracic region: Secondary | ICD-10-CM | POA: Diagnosis not present

## 2016-08-20 DIAGNOSIS — I10 Essential (primary) hypertension: Secondary | ICD-10-CM | POA: Diagnosis not present

## 2016-08-20 NOTE — Telephone Encounter (Signed)
Rinaldo Cloudamela an Charity fundraiserN from [facility] called and wanted verbal orders for possible PT. I could not understand the voicemail that was left on my phone from her. Called her and left her a message to call back and let us know what orders she needed for approval.   Rinaldo Cloudamela- [phone]

## 2016-08-21 ENCOUNTER — Other Ambulatory Visit: Payer: Self-pay | Admitting: Licensed Clinical Social Worker

## 2016-08-21 DIAGNOSIS — M4804 Spinal stenosis, thoracic region: Secondary | ICD-10-CM | POA: Diagnosis not present

## 2016-08-21 DIAGNOSIS — Z87440 Personal history of urinary (tract) infections: Secondary | ICD-10-CM | POA: Diagnosis not present

## 2016-08-21 DIAGNOSIS — K760 Fatty (change of) liver, not elsewhere classified: Secondary | ICD-10-CM | POA: Diagnosis not present

## 2016-08-21 DIAGNOSIS — E1140 Type 2 diabetes mellitus with diabetic neuropathy, unspecified: Secondary | ICD-10-CM | POA: Diagnosis not present

## 2016-08-21 DIAGNOSIS — Z794 Long term (current) use of insulin: Secondary | ICD-10-CM | POA: Diagnosis not present

## 2016-08-21 DIAGNOSIS — I10 Essential (primary) hypertension: Secondary | ICD-10-CM | POA: Diagnosis not present

## 2016-08-21 DIAGNOSIS — Z9181 History of falling: Secondary | ICD-10-CM | POA: Diagnosis not present

## 2016-08-21 NOTE — Patient Outreach (Signed)
[facility]N) Care Management  08/21/2016  Cassandra StalkerBarbara Stewart 1950-03-25 [id number]  Assessment-CSW completed outreach attempt today. CSW unable to reach patient successfully. CSW left a HIPPA compliant voice message encouraging patient to return call once available.  Plan-CSW will await return call or complete an additional outreach if needed.  Cassandra Stewart Cassandra Stewart, BSW, MSW, LCSW [facility] Care Management [email or web address] Phone: [phone] Fax: [phone]

## 2016-08-22 ENCOUNTER — Telehealth: Payer: Self-pay | Admitting: Family Medicine

## 2016-08-22 ENCOUNTER — Other Ambulatory Visit: Payer: Self-pay

## 2016-08-22 DIAGNOSIS — K760 Fatty (change of) liver, not elsewhere classified: Secondary | ICD-10-CM | POA: Diagnosis not present

## 2016-08-22 DIAGNOSIS — Z87440 Personal history of urinary (tract) infections: Secondary | ICD-10-CM | POA: Diagnosis not present

## 2016-08-22 DIAGNOSIS — E1140 Type 2 diabetes mellitus with diabetic neuropathy, unspecified: Secondary | ICD-10-CM | POA: Diagnosis not present

## 2016-08-22 DIAGNOSIS — Z794 Long term (current) use of insulin: Secondary | ICD-10-CM | POA: Diagnosis not present

## 2016-08-22 DIAGNOSIS — Z9181 History of falling: Secondary | ICD-10-CM | POA: Diagnosis not present

## 2016-08-22 DIAGNOSIS — M4804 Spinal stenosis, thoracic region: Secondary | ICD-10-CM | POA: Diagnosis not present

## 2016-08-22 DIAGNOSIS — I10 Essential (primary) hypertension: Secondary | ICD-10-CM | POA: Diagnosis not present

## 2016-08-22 NOTE — Telephone Encounter (Signed)
Melissa, RN with [facility] called and states needs verbal orders for Nursing 2x for 4 weeks and 2 PRN visits for complicated issues. Melissa was also advised about pt's endocrinology appt in august.

## 2016-08-22 NOTE — Telephone Encounter (Signed)
Pt states that she is not taking the meloxicam. She does not want to go to Neurology to have Nerve conduction studies done, as she had this done back in 2009 she believes at [facility] to get disability and she said it was too painful for her. She also said she took her orthopedist yesterday as well. Pt is scheduled with Endo within the next month or so and will be seeing them for her DM

## 2016-08-22 NOTE — Telephone Encounter (Signed)
Please call her and have her stop the meloxicam. She is taking this with Aspirin which can increase her risk of bleeding. Also, her orthopedist suggested a referral to neurology for possible nerve conduction studies for her LLE. Please take care of this. Also, please make sure she is following up with endocrinology for her uncontrolled diabetes. Thanks.

## 2016-08-22 NOTE — Patient Outreach (Signed)
[facility]) Care Management   08/22/2016  Cassandra Stewart January 28, 1951 [id number]  Cassandra Stewart is an 66 y.o. female  Subjective:  I am feeling much better. I am working with HHPT/OT and I have been able to walk a little  Objective:   ROS  Well groomed lady sitting in wheelchair in her living room.   Physical Exam ROS  Encounter Medications:   Outpatient Encounter Prescriptions as of 08/22/2016  Medication Sig  . glucose blood test strip Test 3 times daily. Dx E11.9. Pt uses accu-chek aviva plus meter  . HUMALOG KWIKPEN 100 UNIT/ML KiwkPen INJECT 18 UNITS UNDER THE SKIN THREE TIMES DAILY  . hydrochlorothiazide (MICROZIDE) 12.5 MG capsule TAKE ONE CAPSULE BY MOUTH DAILY  . lisinopril (PRINIVIL,ZESTRIL) 10 MG tablet TAKE 1 TABLET BY MOUTH EVERY MORNING  . lovastatin (MEVACOR) 40 MG tablet TAKE 1 TABLET BY MOUTH AT BEDTIME  . metFORMIN (GLUCOPHAGE) 500 MG tablet TAKE 1 TABLET BY MOUTH TWICE DAILY WITH MEALS  . TOUJEO SOLOSTAR 300 UNIT/ML SOPN INJECT 45 UNITS UNDER THE SKIN DAILY  . ACCU-CHEK SOFTCLIX LANCETS lancets Test 3 times daily. Pt uses accu-chek softclix lancing device  . amLODipine (NORVASC) 10 MG tablet TAKE 1 TABLET BY MOUTH EVERY MORNING  . aspirin EC 81 MG tablet Take 81 mg by mouth every evening.  . Cholecalciferol (VITAMIN D PO) Take 1 tablet by mouth daily.  Marland Kitchen gabapentin (NEURONTIN) 300 MG capsule TAKE 1 CAPSULE BY MOUTH THREE TIMES DAILY  . meloxicam (MOBIC) 7.5 MG tablet Take 7.5 mg by mouth daily as needed for pain. Reported on 09/13/2015  . polyethylene glycol (MIRALAX / GLYCOLAX) packet Take 17 g by mouth daily as needed for mild constipation. (Patient not taking: Reported on 08/22/2016)   No facility-administered encounter medications on file as of 08/22/2016.     Functional Status:   In your present state of health, do you have any difficulty performing the following activities: 08/05/2016 08/01/2016  Hearing? N N  Vision? Y N   Difficulty concentrating or making decisions? N N  Walking or climbing stairs? Y Y  Dressing or bathing? Y Y  Doing errands, shopping? Tempie Donning  Preparing Food and eating ? Y -  Using the Toilet? N -  In the past six months, have you accidently leaked urine? N -  Do you have problems with loss of bowel control? N -  Managing your Medications? N -  Managing your Finances? N -  Housekeeping or managing your Housekeeping? Y -  Some recent data might be hidden    Fall/Depression Screening:    Fall Risk  08/05/2016 07/29/2016 10/25/2015  Falls in the past year? Yes Yes No  Number falls in past yr: 2 or more 2 or more -  Injury with Fall? No No -  Risk Factor Category  High Fall Risk High Fall Risk -  Risk for fall due to : History of fall(s);Impaired balance/gait;Impaired vision;Medication side effect;Impaired mobility Impaired mobility;Impaired balance/gait;History of fall(s) -  Follow up Falls prevention discussed;Follow up appointment Falls evaluation completed -   PHQ 2/9 Scores 08/05/2016 08/01/2016 07/29/2016 10/25/2015  PHQ - 2 Score 0 0 0 0   THN CM Care Plan Problem One     Most Recent Value  Care Plan Problem One  patinent lacks knowledge related to diabetes   Role Documenting the Problem One  Care Management Coordinator  Care Plan for Problem One  Active  THN Long Term Goal   In the next  31 days, patient will be able to create a low carbohydrate meal plan  THN Long Term Goal Start Date  08/05/16  Interventions for Problem One Long Term Goal  6/28 during home visit patient was able to identify breads, regular drinks and snakes as high in carbohydrates   THN CM Short Term Goal #1   In the next 28 days, patient will meet with [facility] RNCM for diabetes education  [facility] CM Short Term Goal #1 Start Date  08/05/16  [facility] CM Short Term Goal #1 Met Date  08/15/16  Interventions for Short Term Goal #1  6/28 patient met with RNCM for diabetic education    [facility] CM Care Plan Problem Two     Most Recent  Value  Care Plan Problem Two  patient had more than one fall in the last year  Role Documenting the Problem Two  Care Management [place] for Problem Two  Active  Interventions for Problem Two Long Term Goal   6/28 patient and RNCM met for fall prevention education.    THN Long Term Goal  In the next 31 days, patient will meet with RNCM for fall prevention education  THN Long Term Goal Start Date  08/05/16     [facility] CM Care Plan Problem One     Most Recent Value  Care Plan Problem One  patinent lacks knowledge related to diabetes   Role Documenting the Problem One  Care Management Coordinator  Care Plan for Problem One  Active  THN Long Term Goal   In the next 31 days, patient will be able to create a low carbohydrate meal plan  THN Long Term Goal Start Date  08/05/16  Interventions for Problem One Long Term Goal  6/28 during home visit patient was able to identify breads, regular drinks and snakes as high in carbohydrates   THN CM Short Term Goal #1   In the next 28 days, patient will meet with [facility] RNCM for diabetes education  [facility] CM Short Term Goal #1 Start Date  08/05/16  [facility] CM Short Term Goal #1 Met Date  08/15/16  Interventions for Short Term Goal #1  6/28 patient met with RNCM for diabetic education    [facility] CM Care Plan Problem Two     Most Recent Value  Care Plan Problem Two  patient had more than one fall in the last year  Role Documenting the Problem Two  Care Management [place] for Problem Two  Active  Interventions for Problem Two Long Term Goal   6/28 patient and RNCM met for fall prevention education.    THN Long Term Goal  In the next 31 days, patient will meet with RNCM for fall prevention education  THN Long Term Goal Start Date  08/05/16     [facility] CM Care Plan Problem One     Most Recent Value  Care Plan Problem One  patinent lacks knowledge related to diabetes   Role Documenting the Problem One  Care Management Coordinator  Care Plan for  Problem One  Active  THN Long Term Goal   In the next 31 days, patient will be able to create a low carbohydrate meal plan  THN Long Term Goal Start Date  08/05/16  Interventions for Problem One Long Term Goal  6/28 during home visit patient was able to identify breads, regular drinks and snakes as high in carbohydrates   THN CM Short Term Goal #1   In  the next 28 days, patient will meet with [place] for diabetes education  [facility] CM Short Term Goal #1 Start Date  08/05/16  [facility] CM Short Term Goal #1 Met Date  08/15/16  Interventions for Short Term Goal #1  6/28 patient met with RNCM for diabetic education    [facility] CM Care Plan Problem Two     Most Recent Value  Care Plan Problem Two  patient had more than one fall in the last year  Role Documenting the Problem Two  Care Management [place] for Problem Two  Active  Interventions for Problem Two Long Term Goal   6/28 patient and RNCM met for fall prevention education.    THN Long Term Goal  In the next 31 days, patient will meet with RNCM for fall prevention education  [facility] Long Term Goal Start Date  08/05/16     Assessment:   Patient reports continued progression in meeting her case management goals. Patient states she is pleased with the home health agency providing care for her at this time.  Plan:  Home visit in the next 2 weeks to assess patient's progress in meeting her case management goals.

## 2016-08-26 ENCOUNTER — Other Ambulatory Visit: Payer: Self-pay | Admitting: Family Medicine

## 2016-08-26 DIAGNOSIS — Z87440 Personal history of urinary (tract) infections: Secondary | ICD-10-CM | POA: Diagnosis not present

## 2016-08-26 DIAGNOSIS — I10 Essential (primary) hypertension: Secondary | ICD-10-CM | POA: Diagnosis not present

## 2016-08-26 DIAGNOSIS — K760 Fatty (change of) liver, not elsewhere classified: Secondary | ICD-10-CM | POA: Diagnosis not present

## 2016-08-26 DIAGNOSIS — M4804 Spinal stenosis, thoracic region: Secondary | ICD-10-CM | POA: Diagnosis not present

## 2016-08-26 DIAGNOSIS — E1140 Type 2 diabetes mellitus with diabetic neuropathy, unspecified: Secondary | ICD-10-CM | POA: Diagnosis not present

## 2016-08-26 DIAGNOSIS — Z9181 History of falling: Secondary | ICD-10-CM | POA: Diagnosis not present

## 2016-08-26 DIAGNOSIS — Z794 Long term (current) use of insulin: Secondary | ICD-10-CM | POA: Diagnosis not present

## 2016-08-26 NOTE — Telephone Encounter (Signed)
Ok to refill. She should be seeing her endocrinologist as you said. She should keep this appointment but do not let her run out of her medications before then.

## 2016-08-26 NOTE — Telephone Encounter (Signed)
Rosanne AshingJim with [facility] called and asked for OT for patient  [phone]

## 2016-08-26 NOTE — Telephone Encounter (Signed)
Rosanne AshingJim from [facility] was given a verbal ok for OT

## 2016-08-26 NOTE — Telephone Encounter (Signed)
Fine

## 2016-08-26 NOTE — Telephone Encounter (Signed)
Is it okay to refill all these meds for a 90 day supply. Pt does have an appt with endocrinology in August.

## 2016-08-27 DIAGNOSIS — I10 Essential (primary) hypertension: Secondary | ICD-10-CM | POA: Diagnosis not present

## 2016-08-27 DIAGNOSIS — Z87440 Personal history of urinary (tract) infections: Secondary | ICD-10-CM | POA: Diagnosis not present

## 2016-08-27 DIAGNOSIS — K760 Fatty (change of) liver, not elsewhere classified: Secondary | ICD-10-CM | POA: Diagnosis not present

## 2016-08-27 DIAGNOSIS — Z794 Long term (current) use of insulin: Secondary | ICD-10-CM | POA: Diagnosis not present

## 2016-08-27 DIAGNOSIS — M4804 Spinal stenosis, thoracic region: Secondary | ICD-10-CM | POA: Diagnosis not present

## 2016-08-27 DIAGNOSIS — E1140 Type 2 diabetes mellitus with diabetic neuropathy, unspecified: Secondary | ICD-10-CM | POA: Diagnosis not present

## 2016-08-27 DIAGNOSIS — Z9181 History of falling: Secondary | ICD-10-CM | POA: Diagnosis not present

## 2016-08-29 DIAGNOSIS — K760 Fatty (change of) liver, not elsewhere classified: Secondary | ICD-10-CM | POA: Diagnosis not present

## 2016-08-29 DIAGNOSIS — Z9181 History of falling: Secondary | ICD-10-CM | POA: Diagnosis not present

## 2016-08-29 DIAGNOSIS — I10 Essential (primary) hypertension: Secondary | ICD-10-CM | POA: Diagnosis not present

## 2016-08-29 DIAGNOSIS — Z794 Long term (current) use of insulin: Secondary | ICD-10-CM | POA: Diagnosis not present

## 2016-08-29 DIAGNOSIS — Z87440 Personal history of urinary (tract) infections: Secondary | ICD-10-CM | POA: Diagnosis not present

## 2016-08-29 DIAGNOSIS — M4804 Spinal stenosis, thoracic region: Secondary | ICD-10-CM | POA: Diagnosis not present

## 2016-08-29 DIAGNOSIS — E1140 Type 2 diabetes mellitus with diabetic neuropathy, unspecified: Secondary | ICD-10-CM | POA: Diagnosis not present

## 2016-08-30 DIAGNOSIS — Z9181 History of falling: Secondary | ICD-10-CM | POA: Diagnosis not present

## 2016-08-30 DIAGNOSIS — K760 Fatty (change of) liver, not elsewhere classified: Secondary | ICD-10-CM | POA: Diagnosis not present

## 2016-08-30 DIAGNOSIS — Z794 Long term (current) use of insulin: Secondary | ICD-10-CM | POA: Diagnosis not present

## 2016-08-30 DIAGNOSIS — I10 Essential (primary) hypertension: Secondary | ICD-10-CM | POA: Diagnosis not present

## 2016-08-30 DIAGNOSIS — Z87440 Personal history of urinary (tract) infections: Secondary | ICD-10-CM | POA: Diagnosis not present

## 2016-08-30 DIAGNOSIS — M4804 Spinal stenosis, thoracic region: Secondary | ICD-10-CM | POA: Diagnosis not present

## 2016-08-30 DIAGNOSIS — E1140 Type 2 diabetes mellitus with diabetic neuropathy, unspecified: Secondary | ICD-10-CM | POA: Diagnosis not present

## 2016-09-02 ENCOUNTER — Other Ambulatory Visit: Payer: Self-pay | Admitting: Licensed Clinical Social Worker

## 2016-09-02 DIAGNOSIS — K760 Fatty (change of) liver, not elsewhere classified: Secondary | ICD-10-CM | POA: Diagnosis not present

## 2016-09-02 DIAGNOSIS — M4804 Spinal stenosis, thoracic region: Secondary | ICD-10-CM | POA: Diagnosis not present

## 2016-09-02 DIAGNOSIS — E1140 Type 2 diabetes mellitus with diabetic neuropathy, unspecified: Secondary | ICD-10-CM | POA: Diagnosis not present

## 2016-09-02 DIAGNOSIS — M6281 Muscle weakness (generalized): Secondary | ICD-10-CM | POA: Diagnosis not present

## 2016-09-02 DIAGNOSIS — Z9181 History of falling: Secondary | ICD-10-CM | POA: Diagnosis not present

## 2016-09-02 DIAGNOSIS — Z794 Long term (current) use of insulin: Secondary | ICD-10-CM | POA: Diagnosis not present

## 2016-09-02 DIAGNOSIS — Z87440 Personal history of urinary (tract) infections: Secondary | ICD-10-CM | POA: Diagnosis not present

## 2016-09-02 DIAGNOSIS — G6289 Other specified polyneuropathies: Secondary | ICD-10-CM | POA: Diagnosis not present

## 2016-09-02 DIAGNOSIS — I10 Essential (primary) hypertension: Secondary | ICD-10-CM | POA: Diagnosis not present

## 2016-09-02 NOTE — Patient Outreach (Signed)
[facility]) Care Management  09/02/2016  Cassandra StalkerBarbara Stewart 20-Mar-1950 [id number]   Assessment-CSW completed outreach attempt today to follow up on SCAT. CSW unable to reach patient successfully. CSW left a HIPPA compliant voice message encouraging patient to return call once available.  Plan-CSW will await return call or complete an additional outreach if needed.  Cassandra Stewart Cassandra Stewart, BSW, MSW, LCSW [facility] Care Management [email or web address] Phone: [phone] Fax: [phone]

## 2016-09-03 DIAGNOSIS — M4804 Spinal stenosis, thoracic region: Secondary | ICD-10-CM | POA: Diagnosis not present

## 2016-09-03 DIAGNOSIS — E1140 Type 2 diabetes mellitus with diabetic neuropathy, unspecified: Secondary | ICD-10-CM | POA: Diagnosis not present

## 2016-09-03 DIAGNOSIS — Z87440 Personal history of urinary (tract) infections: Secondary | ICD-10-CM | POA: Diagnosis not present

## 2016-09-03 DIAGNOSIS — I10 Essential (primary) hypertension: Secondary | ICD-10-CM | POA: Diagnosis not present

## 2016-09-03 DIAGNOSIS — Z9181 History of falling: Secondary | ICD-10-CM | POA: Diagnosis not present

## 2016-09-03 DIAGNOSIS — Z794 Long term (current) use of insulin: Secondary | ICD-10-CM | POA: Diagnosis not present

## 2016-09-03 DIAGNOSIS — K760 Fatty (change of) liver, not elsewhere classified: Secondary | ICD-10-CM | POA: Diagnosis not present

## 2016-09-04 DIAGNOSIS — K760 Fatty (change of) liver, not elsewhere classified: Secondary | ICD-10-CM | POA: Diagnosis not present

## 2016-09-04 DIAGNOSIS — I10 Essential (primary) hypertension: Secondary | ICD-10-CM | POA: Diagnosis not present

## 2016-09-04 DIAGNOSIS — M4804 Spinal stenosis, thoracic region: Secondary | ICD-10-CM | POA: Diagnosis not present

## 2016-09-04 DIAGNOSIS — Z794 Long term (current) use of insulin: Secondary | ICD-10-CM | POA: Diagnosis not present

## 2016-09-04 DIAGNOSIS — E1140 Type 2 diabetes mellitus with diabetic neuropathy, unspecified: Secondary | ICD-10-CM | POA: Diagnosis not present

## 2016-09-04 DIAGNOSIS — Z87440 Personal history of urinary (tract) infections: Secondary | ICD-10-CM | POA: Diagnosis not present

## 2016-09-04 DIAGNOSIS — Z9181 History of falling: Secondary | ICD-10-CM | POA: Diagnosis not present

## 2016-09-05 DIAGNOSIS — E1140 Type 2 diabetes mellitus with diabetic neuropathy, unspecified: Secondary | ICD-10-CM | POA: Diagnosis not present

## 2016-09-05 DIAGNOSIS — Z9181 History of falling: Secondary | ICD-10-CM | POA: Diagnosis not present

## 2016-09-05 DIAGNOSIS — K760 Fatty (change of) liver, not elsewhere classified: Secondary | ICD-10-CM | POA: Diagnosis not present

## 2016-09-05 DIAGNOSIS — M4804 Spinal stenosis, thoracic region: Secondary | ICD-10-CM | POA: Diagnosis not present

## 2016-09-05 DIAGNOSIS — Z87440 Personal history of urinary (tract) infections: Secondary | ICD-10-CM | POA: Diagnosis not present

## 2016-09-05 DIAGNOSIS — Z794 Long term (current) use of insulin: Secondary | ICD-10-CM | POA: Diagnosis not present

## 2016-09-05 DIAGNOSIS — I10 Essential (primary) hypertension: Secondary | ICD-10-CM | POA: Diagnosis not present

## 2016-09-06 ENCOUNTER — Telehealth: Payer: Self-pay | Admitting: Internal Medicine

## 2016-09-06 ENCOUNTER — Other Ambulatory Visit: Payer: Self-pay | Admitting: Licensed Clinical Social Worker

## 2016-09-06 DIAGNOSIS — Z87440 Personal history of urinary (tract) infections: Secondary | ICD-10-CM | POA: Diagnosis not present

## 2016-09-06 DIAGNOSIS — K760 Fatty (change of) liver, not elsewhere classified: Secondary | ICD-10-CM | POA: Diagnosis not present

## 2016-09-06 DIAGNOSIS — I10 Essential (primary) hypertension: Secondary | ICD-10-CM | POA: Diagnosis not present

## 2016-09-06 DIAGNOSIS — Z9181 History of falling: Secondary | ICD-10-CM | POA: Diagnosis not present

## 2016-09-06 DIAGNOSIS — E1140 Type 2 diabetes mellitus with diabetic neuropathy, unspecified: Secondary | ICD-10-CM | POA: Diagnosis not present

## 2016-09-06 DIAGNOSIS — Z794 Long term (current) use of insulin: Secondary | ICD-10-CM | POA: Diagnosis not present

## 2016-09-06 DIAGNOSIS — M4804 Spinal stenosis, thoracic region: Secondary | ICD-10-CM | POA: Diagnosis not present

## 2016-09-06 NOTE — Telephone Encounter (Signed)
Pt called and states her handicap placard is going to run out at the end of this month. She would like this renewed. Her Ex- husband or daughter takes her to the drug store, get groceries and section [address] and needs this when she goes. She would like this mailed to her

## 2016-09-06 NOTE — Patient Outreach (Signed)
[facility]) Care Management  09/06/2016  Cassandra StalkerBarbara Stewart [birth year] [id number]  Assessment-CSW completed additional outreach attempt today. CSW unable to reach patient successfully. CSW left a HIPPA compliant voice message encouraging patient to return call once available.  Plan-CSW will await return call or complete an additional outreach if needed.  Cassandra Stewart Cassandra Stewart, BSW, MSW, LCSW [facility] Care Management [email or web address] Phone: [phone] Fax: [phone]

## 2016-09-06 NOTE — Telephone Encounter (Signed)
Mailed form to patient. Will go out on Monday 09/09/16 in mail

## 2016-09-06 NOTE — Patient Outreach (Signed)
[facility]) Care Management  09/06/2016  Cassandra StalkerBarbara Stewart 01-02-1951 [id number]  Assessment- CSW received incoming call from patient. She reported that she still has not heard from SCAT yet. CSW completed call to SCAT and was able to speak to Cassandra Stewart. Cassandra Stewart confirmed that application was received successfully last month and that she will contact patient today to schedule assessment appointment. CSW completed call to patient to provide update.  Plan-CSW will follow up within two weeks.  Cassandra Stewart, BSW, MSW, LCSW [facility] Care Management [email or web address] Phone: [phone] Fax: [phone]

## 2016-09-06 NOTE — Telephone Encounter (Signed)
This is ok

## 2016-09-09 DIAGNOSIS — I10 Essential (primary) hypertension: Secondary | ICD-10-CM | POA: Diagnosis not present

## 2016-09-09 DIAGNOSIS — K760 Fatty (change of) liver, not elsewhere classified: Secondary | ICD-10-CM | POA: Diagnosis not present

## 2016-09-09 DIAGNOSIS — E1140 Type 2 diabetes mellitus with diabetic neuropathy, unspecified: Secondary | ICD-10-CM | POA: Diagnosis not present

## 2016-09-09 DIAGNOSIS — Z87440 Personal history of urinary (tract) infections: Secondary | ICD-10-CM | POA: Diagnosis not present

## 2016-09-09 DIAGNOSIS — Z9181 History of falling: Secondary | ICD-10-CM | POA: Diagnosis not present

## 2016-09-09 DIAGNOSIS — Z794 Long term (current) use of insulin: Secondary | ICD-10-CM | POA: Diagnosis not present

## 2016-09-09 DIAGNOSIS — M4804 Spinal stenosis, thoracic region: Secondary | ICD-10-CM | POA: Diagnosis not present

## 2016-09-10 DIAGNOSIS — M4804 Spinal stenosis, thoracic region: Secondary | ICD-10-CM | POA: Diagnosis not present

## 2016-09-10 DIAGNOSIS — E1140 Type 2 diabetes mellitus with diabetic neuropathy, unspecified: Secondary | ICD-10-CM | POA: Diagnosis not present

## 2016-09-10 DIAGNOSIS — Z9181 History of falling: Secondary | ICD-10-CM | POA: Diagnosis not present

## 2016-09-10 DIAGNOSIS — K760 Fatty (change of) liver, not elsewhere classified: Secondary | ICD-10-CM | POA: Diagnosis not present

## 2016-09-10 DIAGNOSIS — Z794 Long term (current) use of insulin: Secondary | ICD-10-CM | POA: Diagnosis not present

## 2016-09-10 DIAGNOSIS — I10 Essential (primary) hypertension: Secondary | ICD-10-CM | POA: Diagnosis not present

## 2016-09-10 DIAGNOSIS — Z87440 Personal history of urinary (tract) infections: Secondary | ICD-10-CM | POA: Diagnosis not present

## 2016-09-11 DIAGNOSIS — M4804 Spinal stenosis, thoracic region: Secondary | ICD-10-CM | POA: Diagnosis not present

## 2016-09-11 DIAGNOSIS — K760 Fatty (change of) liver, not elsewhere classified: Secondary | ICD-10-CM | POA: Diagnosis not present

## 2016-09-11 DIAGNOSIS — E1140 Type 2 diabetes mellitus with diabetic neuropathy, unspecified: Secondary | ICD-10-CM | POA: Diagnosis not present

## 2016-09-11 DIAGNOSIS — Z794 Long term (current) use of insulin: Secondary | ICD-10-CM | POA: Diagnosis not present

## 2016-09-11 DIAGNOSIS — Z87440 Personal history of urinary (tract) infections: Secondary | ICD-10-CM | POA: Diagnosis not present

## 2016-09-11 DIAGNOSIS — Z9181 History of falling: Secondary | ICD-10-CM | POA: Diagnosis not present

## 2016-09-11 DIAGNOSIS — I10 Essential (primary) hypertension: Secondary | ICD-10-CM | POA: Diagnosis not present

## 2016-09-12 DIAGNOSIS — Z9181 History of falling: Secondary | ICD-10-CM | POA: Diagnosis not present

## 2016-09-12 DIAGNOSIS — Z87440 Personal history of urinary (tract) infections: Secondary | ICD-10-CM | POA: Diagnosis not present

## 2016-09-12 DIAGNOSIS — K760 Fatty (change of) liver, not elsewhere classified: Secondary | ICD-10-CM | POA: Diagnosis not present

## 2016-09-12 DIAGNOSIS — M4804 Spinal stenosis, thoracic region: Secondary | ICD-10-CM | POA: Diagnosis not present

## 2016-09-12 DIAGNOSIS — I10 Essential (primary) hypertension: Secondary | ICD-10-CM | POA: Diagnosis not present

## 2016-09-12 DIAGNOSIS — E1140 Type 2 diabetes mellitus with diabetic neuropathy, unspecified: Secondary | ICD-10-CM | POA: Diagnosis not present

## 2016-09-12 DIAGNOSIS — Z794 Long term (current) use of insulin: Secondary | ICD-10-CM | POA: Diagnosis not present

## 2016-09-13 ENCOUNTER — Other Ambulatory Visit: Payer: Self-pay | Admitting: Licensed Clinical Social Worker

## 2016-09-13 DIAGNOSIS — E1140 Type 2 diabetes mellitus with diabetic neuropathy, unspecified: Secondary | ICD-10-CM | POA: Diagnosis not present

## 2016-09-13 DIAGNOSIS — I10 Essential (primary) hypertension: Secondary | ICD-10-CM | POA: Diagnosis not present

## 2016-09-13 DIAGNOSIS — K760 Fatty (change of) liver, not elsewhere classified: Secondary | ICD-10-CM | POA: Diagnosis not present

## 2016-09-13 DIAGNOSIS — M4804 Spinal stenosis, thoracic region: Secondary | ICD-10-CM | POA: Diagnosis not present

## 2016-09-13 DIAGNOSIS — Z794 Long term (current) use of insulin: Secondary | ICD-10-CM | POA: Diagnosis not present

## 2016-09-13 DIAGNOSIS — Z9181 History of falling: Secondary | ICD-10-CM | POA: Diagnosis not present

## 2016-09-13 DIAGNOSIS — Z87440 Personal history of urinary (tract) infections: Secondary | ICD-10-CM | POA: Diagnosis not present

## 2016-09-13 NOTE — Patient Outreach (Signed)
[facility]) Care Management  09/13/2016  Cassandra StalkerBarbara Stewart 05/03/1950 [id number]  Assessment-CSW completed outreach attempt today to see if SCAT has contacted patient yet in order to schedule assessment appointment. CSW unable to reach patient successfully. CSW left a HIPPA compliant voice message encouraging patient to return call once available.  Plan-CSW will await return call or complete an additional outreach if needed.  Cassandra Stewart [name], BSW, MSW, LCSW [facility] Care Management [name] Phone: [phone] Fax: [phone]

## 2016-09-16 DIAGNOSIS — M4804 Spinal stenosis, thoracic region: Secondary | ICD-10-CM | POA: Diagnosis not present

## 2016-09-17 DIAGNOSIS — I10 Essential (primary) hypertension: Secondary | ICD-10-CM | POA: Diagnosis not present

## 2016-09-17 DIAGNOSIS — Z794 Long term (current) use of insulin: Secondary | ICD-10-CM | POA: Diagnosis not present

## 2016-09-17 DIAGNOSIS — Z87440 Personal history of urinary (tract) infections: Secondary | ICD-10-CM | POA: Diagnosis not present

## 2016-09-17 DIAGNOSIS — E1140 Type 2 diabetes mellitus with diabetic neuropathy, unspecified: Secondary | ICD-10-CM | POA: Diagnosis not present

## 2016-09-17 DIAGNOSIS — K760 Fatty (change of) liver, not elsewhere classified: Secondary | ICD-10-CM | POA: Diagnosis not present

## 2016-09-17 DIAGNOSIS — M4804 Spinal stenosis, thoracic region: Secondary | ICD-10-CM | POA: Diagnosis not present

## 2016-09-17 DIAGNOSIS — Z9181 History of falling: Secondary | ICD-10-CM | POA: Diagnosis not present

## 2016-09-19 DIAGNOSIS — Z87440 Personal history of urinary (tract) infections: Secondary | ICD-10-CM | POA: Diagnosis not present

## 2016-09-19 DIAGNOSIS — E1140 Type 2 diabetes mellitus with diabetic neuropathy, unspecified: Secondary | ICD-10-CM | POA: Diagnosis not present

## 2016-09-19 DIAGNOSIS — Z9181 History of falling: Secondary | ICD-10-CM | POA: Diagnosis not present

## 2016-09-19 DIAGNOSIS — Z794 Long term (current) use of insulin: Secondary | ICD-10-CM | POA: Diagnosis not present

## 2016-09-19 DIAGNOSIS — I10 Essential (primary) hypertension: Secondary | ICD-10-CM | POA: Diagnosis not present

## 2016-09-19 DIAGNOSIS — K760 Fatty (change of) liver, not elsewhere classified: Secondary | ICD-10-CM | POA: Diagnosis not present

## 2016-09-19 DIAGNOSIS — M4804 Spinal stenosis, thoracic region: Secondary | ICD-10-CM | POA: Diagnosis not present

## 2016-09-20 DIAGNOSIS — Z794 Long term (current) use of insulin: Secondary | ICD-10-CM | POA: Diagnosis not present

## 2016-09-20 DIAGNOSIS — I10 Essential (primary) hypertension: Secondary | ICD-10-CM | POA: Diagnosis not present

## 2016-09-20 DIAGNOSIS — Z87440 Personal history of urinary (tract) infections: Secondary | ICD-10-CM | POA: Diagnosis not present

## 2016-09-20 DIAGNOSIS — M4804 Spinal stenosis, thoracic region: Secondary | ICD-10-CM | POA: Diagnosis not present

## 2016-09-20 DIAGNOSIS — E1140 Type 2 diabetes mellitus with diabetic neuropathy, unspecified: Secondary | ICD-10-CM | POA: Diagnosis not present

## 2016-09-20 DIAGNOSIS — K760 Fatty (change of) liver, not elsewhere classified: Secondary | ICD-10-CM | POA: Diagnosis not present

## 2016-09-20 DIAGNOSIS — Z9181 History of falling: Secondary | ICD-10-CM | POA: Diagnosis not present

## 2016-09-21 IMAGING — MR MR LUMBAR SPINE W/O CM
4 of 5 series · 19 of 48 positions shown · non-contrast
Comparison: 07/04/2015

CLINICAL DATA: Low back pain since [DATE]. Pain radiates down the
left leg with leg weakness and gait instability. Spine surgery
07/06/2015.

EXAM:
MRI LUMBAR SPINE WITHOUT CONTRAST
TECHNIQUE: Multiplanar, multisequence MR imaging of the lumbar spine was
performed. No intravenous contrast was administered.

[Series 6: T2 · sagittal · 4.0mm · 0.73mm/px · 7 of 15 slices shown (1 of 2)]
[im 1/15]
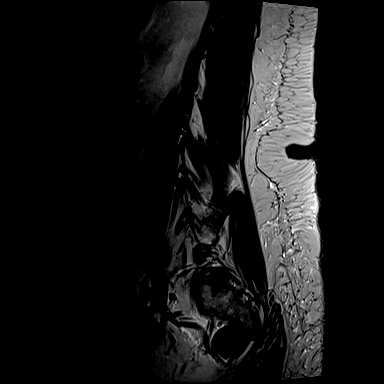
[im 3/15]
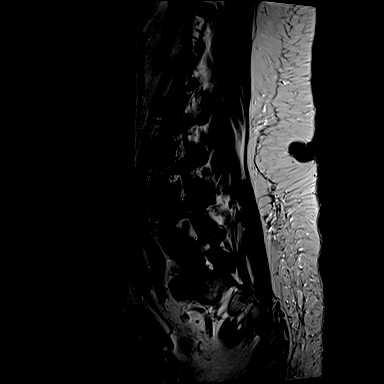
[im 5/15]
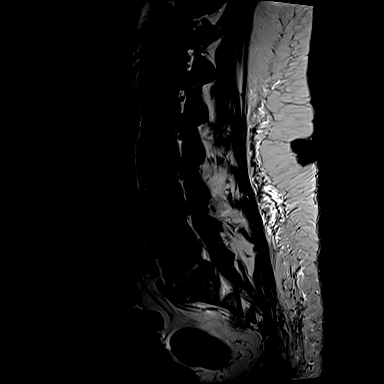
[im 8/15]
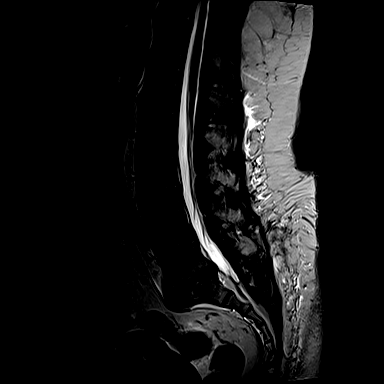
[im 10/15]
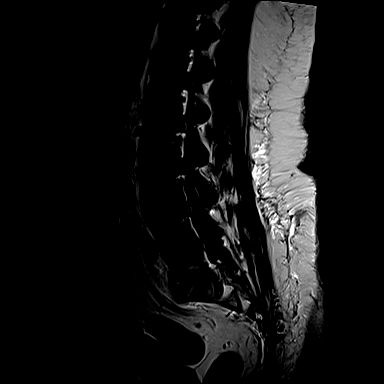
[im 12/15]
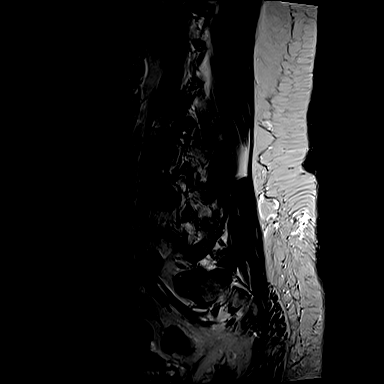
[im 15/15]
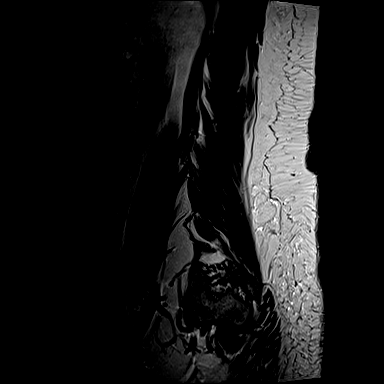

[Series 7: T1 · sagittal · 4.0mm · 0.73mm/px · 3 of 15 slices shown (1 of 2)]
[im 3/15]
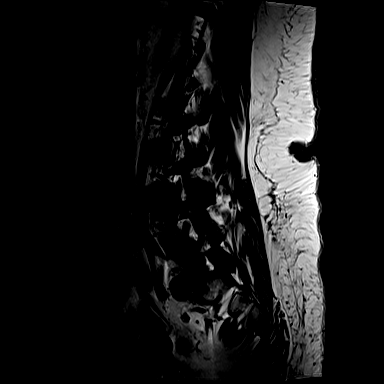
[im 8/15]
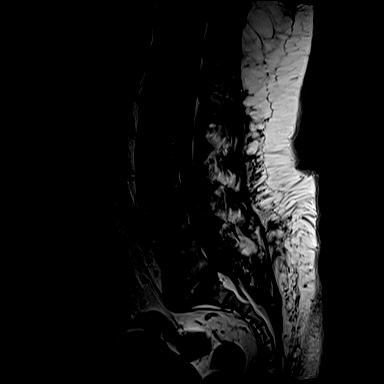
[im 12/15]
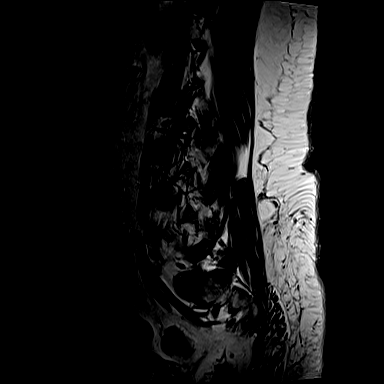

[Series 11: T1 · axial · 4.0mm · 0.28mm/px · z∈[-165,-37]mm · 3 of 34 slices shown (2 of 2)]
[im 6/34]
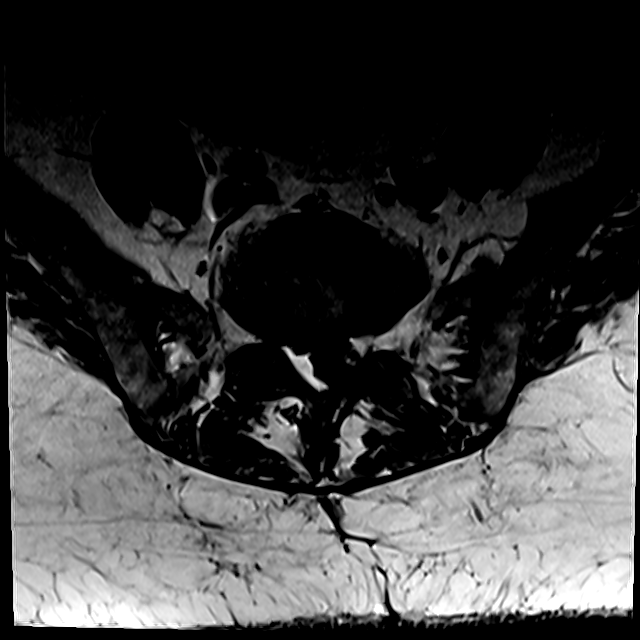
[im 18/34]
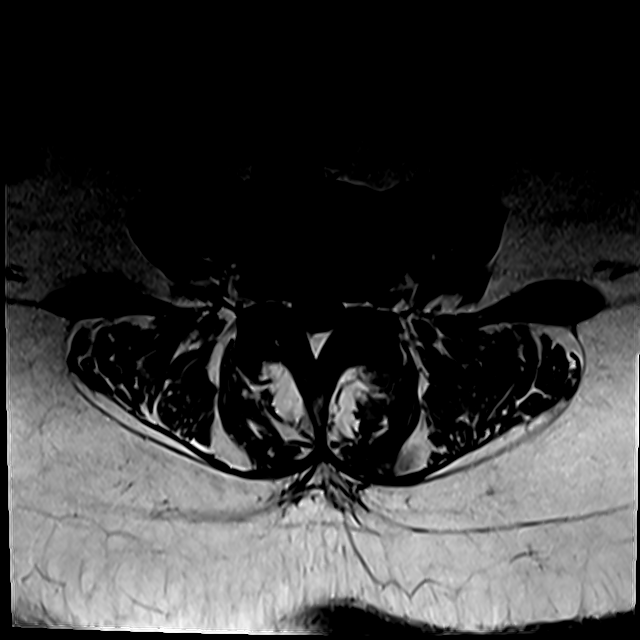
[im 28/34]
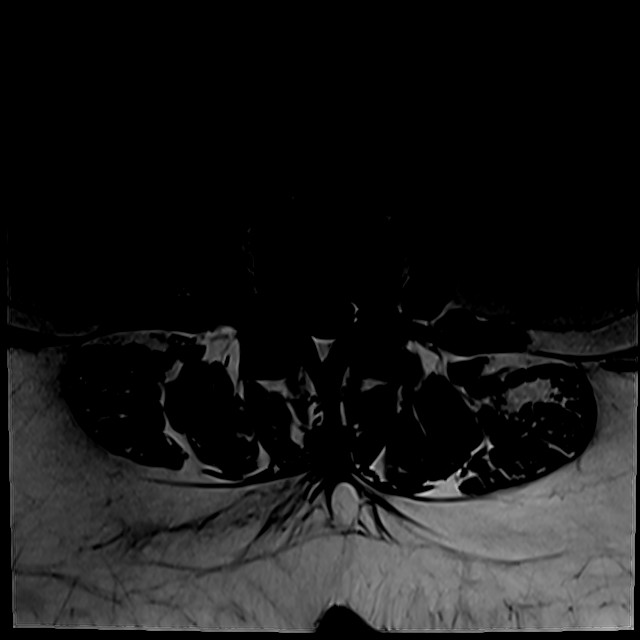

[Series 14: T2 · axial · 4.0mm · 0.28mm/px · z∈[-190,-37]mm · 6 of 34 slices shown (2 of 2)]
[im 1/34]
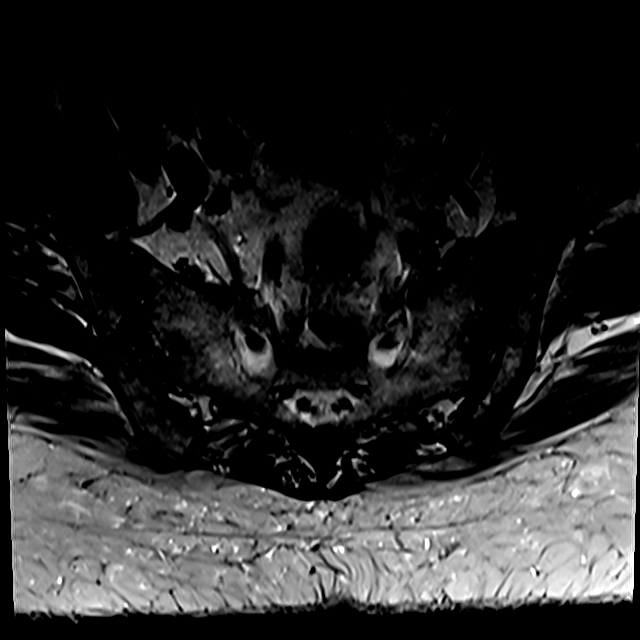
[im 6/34]
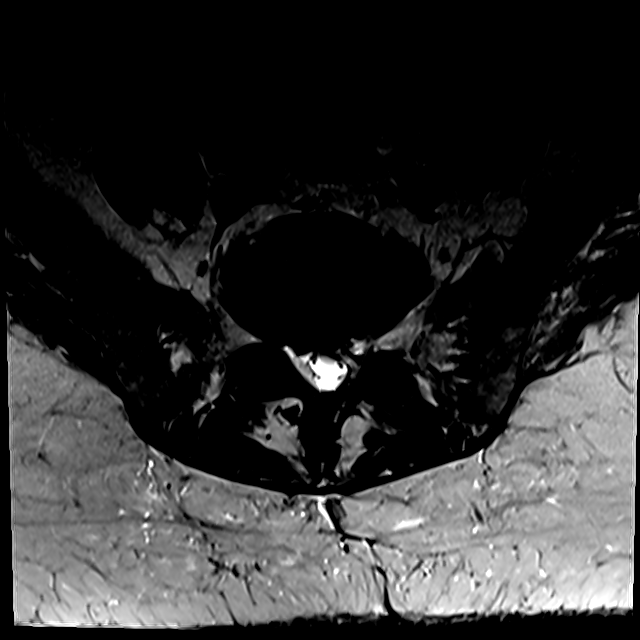
[im 11/34]
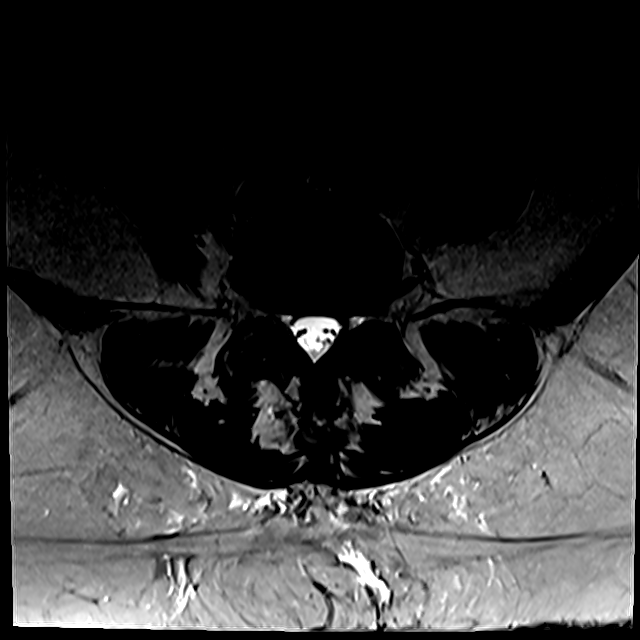
[im 16/34]
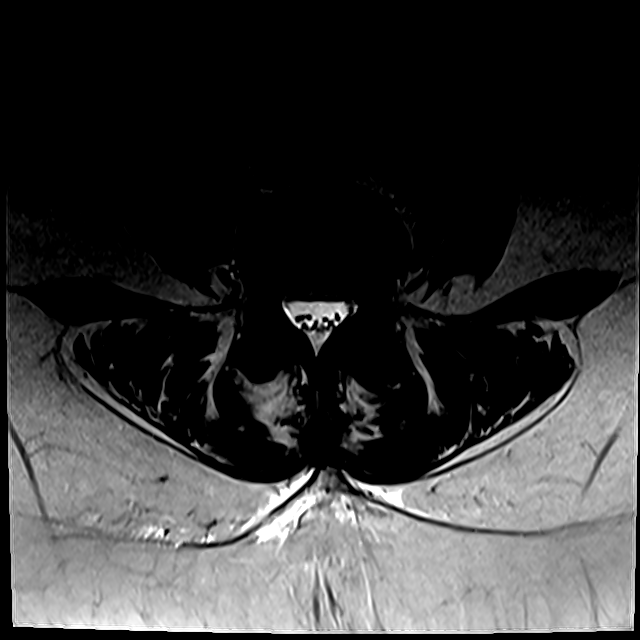
[im 18/34]
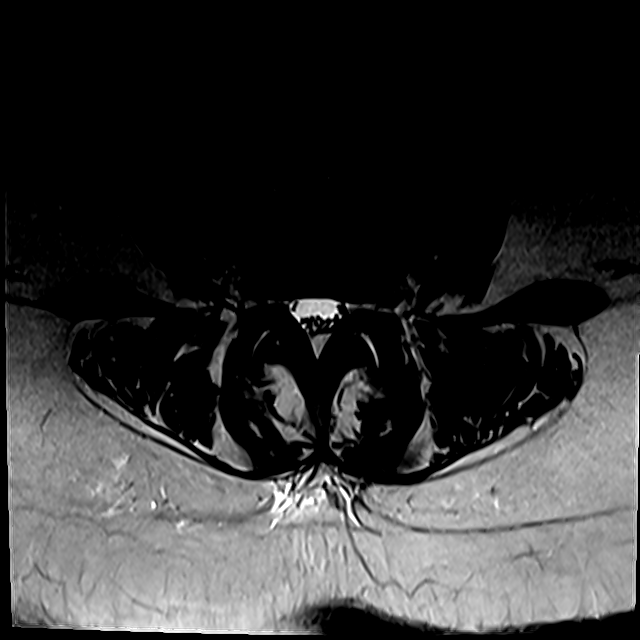
[im 28/34]
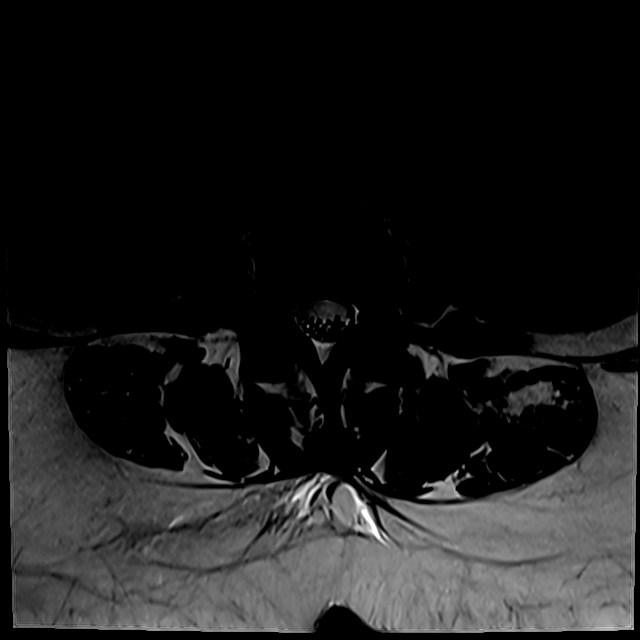

[19 of 48 positions shown; findings below may reference images not displayed]

FINDINGS: Segmentation: Lumbarized S1. Numbering consistent with the prior
MRI.

Alignment:  Normal.

Vertebrae: Preserved vertebral body heights without evidence of
fracture. Disc desiccation and mild disc space narrowing are again
seen at S1-2 with progressive, mild-to-moderate degenerative
endplate edema.

Conus medullaris: Extends to the upper L3 level and appears normal.

Paraspinal and other soft tissues: Unremarkable aside from
postoperative changes in the posterior soft tissues of the low back.

Disc levels:

L1-2: Only imaged sagittally.  Negative.

L2-3: Slight facet hypertrophy without disc herniation or stenosis,
unchanged.

L3-4: Slight facet hypertrophy without disc herniation or stenosis,
unchanged.

L4-5: Minimal disc bulging, ligamentum flavum thickening, and
mild-to-moderate facet arthrosis without stenosis, unchanged.

L5-S1: Severe facet arthrosis without disc herniation or stenosis,
unchanged.

S1-2: Prior left laminectomy. Left-sided epidural fibrosis again
noted. Disc bulging and endplate spurring asymmetric to the right,
disc space height loss, and facet arthrosis result in mild right
neural foraminal stenosis, unchanged. Spinal canal and left neural
foramen are patent.
IMPRESSION: 1. Mildly increased degenerative endplate edema at S1-2 with
unchanged mild right neural foraminal stenosis.
2. Unchanged disc and facet degeneration elsewhere as above. No
left-sided neural impingement identified.

## 2016-09-23 ENCOUNTER — Other Ambulatory Visit: Payer: Self-pay | Admitting: Licensed Clinical Social Worker

## 2016-09-23 NOTE — Patient Outreach (Signed)
[facility]) Care Management  09/23/2016  Cassandra StalkerBarbara Stewart 1951/01/23 [id number]  Assessment- CSW completed outreach to patient on 09/23/16 and patient answered. CSW was informed to contact patient at number [phone] as her other line is no longer working. CSW did. CSW questioned if she heard from SCAT. Patient reports that she has contacted SCAT 12 times and has left several voice messages with [place]. CSW has also left messages informing them of her new contact number. CSW informed patient that she has also contacted SCAT several times but will do so again. Patient very appreciative.  Patient reports that she found out yesterday that her oldest daughter passed away from heart problems. CSW provided extensive emotional support as well as grief support resources. Patent appreciative of this but states "I'll be okay." CSW provided condolences and informed her that she would be more than willing to assist her with setting [facility]. Patient declined at this time.  CSW completed all to [place] and [place] with SCAT services. They were unable to be reached but a HIPPA compliant was left with both staff messages inquiring about patient's SCAT application.  Plan-CSW will await for return call from SCAT and will follow up within two-three weeks.  Cassandra Stewart, Cassandra Stewart, Cassandra Stewart, Cassandra Stewart [facility] Care Management [email or web address] Phone: [phone] Fax: [phone]

## 2016-09-25 ENCOUNTER — Other Ambulatory Visit: Payer: Self-pay | Admitting: Licensed Clinical Social Worker

## 2016-09-25 DIAGNOSIS — Z9181 History of falling: Secondary | ICD-10-CM | POA: Diagnosis not present

## 2016-09-25 DIAGNOSIS — Z794 Long term (current) use of insulin: Secondary | ICD-10-CM | POA: Diagnosis not present

## 2016-09-25 DIAGNOSIS — Z87440 Personal history of urinary (tract) infections: Secondary | ICD-10-CM | POA: Diagnosis not present

## 2016-09-25 DIAGNOSIS — I10 Essential (primary) hypertension: Secondary | ICD-10-CM | POA: Diagnosis not present

## 2016-09-25 DIAGNOSIS — E1140 Type 2 diabetes mellitus with diabetic neuropathy, unspecified: Secondary | ICD-10-CM | POA: Diagnosis not present

## 2016-09-25 DIAGNOSIS — M4804 Spinal stenosis, thoracic region: Secondary | ICD-10-CM | POA: Diagnosis not present

## 2016-09-25 DIAGNOSIS — K760 Fatty (change of) liver, not elsewhere classified: Secondary | ICD-10-CM | POA: Diagnosis not present

## 2016-09-25 NOTE — Patient Outreach (Signed)
[facility]) Care Management  09/25/2016  Tressie StalkerBarbara Crombie 1950/09/10 [id number]  Assessment-CSW completed outreach attempt today to follow up on recent loss and provide emotional support as well as another review of grief support resources. CSW unable to reach patient successfully. CSW left a HIPPA compliant voice message encouraging patient to return call once available.  Plan-CSW will await return call or complete an additional outreach if needed.  [name] Reigna Ruperto, BSW, MSW, LCSW [facility] Care Management [email or web address] Phone: [phone] Fax: [phone]

## 2016-09-26 DIAGNOSIS — E1140 Type 2 diabetes mellitus with diabetic neuropathy, unspecified: Secondary | ICD-10-CM | POA: Diagnosis not present

## 2016-09-26 DIAGNOSIS — Z9181 History of falling: Secondary | ICD-10-CM | POA: Diagnosis not present

## 2016-09-26 DIAGNOSIS — M4804 Spinal stenosis, thoracic region: Secondary | ICD-10-CM | POA: Diagnosis not present

## 2016-09-26 DIAGNOSIS — I10 Essential (primary) hypertension: Secondary | ICD-10-CM | POA: Diagnosis not present

## 2016-09-26 DIAGNOSIS — K760 Fatty (change of) liver, not elsewhere classified: Secondary | ICD-10-CM | POA: Diagnosis not present

## 2016-09-26 DIAGNOSIS — Z794 Long term (current) use of insulin: Secondary | ICD-10-CM | POA: Diagnosis not present

## 2016-09-26 DIAGNOSIS — Z87440 Personal history of urinary (tract) infections: Secondary | ICD-10-CM | POA: Diagnosis not present

## 2016-09-27 DIAGNOSIS — E1140 Type 2 diabetes mellitus with diabetic neuropathy, unspecified: Secondary | ICD-10-CM | POA: Diagnosis not present

## 2016-09-27 DIAGNOSIS — I10 Essential (primary) hypertension: Secondary | ICD-10-CM | POA: Diagnosis not present

## 2016-09-27 DIAGNOSIS — Z794 Long term (current) use of insulin: Secondary | ICD-10-CM | POA: Diagnosis not present

## 2016-09-27 DIAGNOSIS — M4804 Spinal stenosis, thoracic region: Secondary | ICD-10-CM | POA: Diagnosis not present

## 2016-09-27 DIAGNOSIS — Z87440 Personal history of urinary (tract) infections: Secondary | ICD-10-CM | POA: Diagnosis not present

## 2016-09-27 DIAGNOSIS — Z9181 History of falling: Secondary | ICD-10-CM | POA: Diagnosis not present

## 2016-09-27 DIAGNOSIS — K760 Fatty (change of) liver, not elsewhere classified: Secondary | ICD-10-CM | POA: Diagnosis not present

## 2016-09-30 ENCOUNTER — Other Ambulatory Visit: Payer: Self-pay | Admitting: Licensed Clinical Social Worker

## 2016-09-30 DIAGNOSIS — M4804 Spinal stenosis, thoracic region: Secondary | ICD-10-CM | POA: Diagnosis not present

## 2016-09-30 DIAGNOSIS — I10 Essential (primary) hypertension: Secondary | ICD-10-CM | POA: Diagnosis not present

## 2016-09-30 DIAGNOSIS — K760 Fatty (change of) liver, not elsewhere classified: Secondary | ICD-10-CM | POA: Diagnosis not present

## 2016-09-30 DIAGNOSIS — Z9181 History of falling: Secondary | ICD-10-CM | POA: Diagnosis not present

## 2016-09-30 DIAGNOSIS — E1140 Type 2 diabetes mellitus with diabetic neuropathy, unspecified: Secondary | ICD-10-CM | POA: Diagnosis not present

## 2016-09-30 DIAGNOSIS — Z794 Long term (current) use of insulin: Secondary | ICD-10-CM | POA: Diagnosis not present

## 2016-09-30 DIAGNOSIS — Z87440 Personal history of urinary (tract) infections: Secondary | ICD-10-CM | POA: Diagnosis not present

## 2016-09-30 NOTE — Patient Outreach (Signed)
[facility]) Care Management  09/30/2016  Tressie StalkerBarbara Dolata [birth year] [id number]  Assessment- CSW completed call to patient at new contact number and she answered. HIPPA verifications provided. CSW offered emotional support throughout entire phone call due to patient recently losing her daughter. Patient was very receptive to emotional support provided. She reports that her daughters ashes are going to be mailed to her in the next few days and that she feels once she receives them that she will be at peace. She reports that is appreciative of CSW calling and checking on her. She shares that "my wounds are still fresh but I'm hoping with time it will get better." CSW asked if she could complete outreach call in 2-3 weeks in order to review grief resources again and patient is agreeable. Patient reports that she called SCAT 3 times last week and has still not heard back but plans on calling again after she finishes PT today.  Plan-CSW will follow up within 2-3 weeks and complete review of grief support resources. CSW will contact SCAT again within 1 week to inquire about patient's application status.  [name] Talayah Picardi, BSW, MSW, LCSW [facility] Care Management [email or web address] Phone: [phone] Fax: [phone]

## 2016-10-01 ENCOUNTER — Telehealth: Payer: Self-pay | Admitting: Internal Medicine

## 2016-10-01 ENCOUNTER — Telehealth: Payer: Self-pay | Admitting: Family Medicine

## 2016-10-01 NOTE — Telephone Encounter (Signed)
Ok to approve this. jo

## 2016-10-01 NOTE — Telephone Encounter (Signed)
Cassandra Stewart with [facility] called and requested additional physical therapy with pt. He is requesting:  One visit once a week Two visits a week and then One visit once a week.  Please advise Cassandra Stewart at [phone].

## 2016-10-01 NOTE — Telephone Encounter (Signed)
Patient called in reference to needing to reschedule NP appointment due to transportation issues. Patient also would like to know what she is being seen for since this was a referral. Please call patient and advise.

## 2016-10-02 DIAGNOSIS — Z87440 Personal history of urinary (tract) infections: Secondary | ICD-10-CM | POA: Diagnosis not present

## 2016-10-02 DIAGNOSIS — Z794 Long term (current) use of insulin: Secondary | ICD-10-CM | POA: Diagnosis not present

## 2016-10-02 DIAGNOSIS — E1140 Type 2 diabetes mellitus with diabetic neuropathy, unspecified: Secondary | ICD-10-CM | POA: Diagnosis not present

## 2016-10-02 DIAGNOSIS — M4804 Spinal stenosis, thoracic region: Secondary | ICD-10-CM | POA: Diagnosis not present

## 2016-10-02 DIAGNOSIS — Z9181 History of falling: Secondary | ICD-10-CM | POA: Diagnosis not present

## 2016-10-02 DIAGNOSIS — K760 Fatty (change of) liver, not elsewhere classified: Secondary | ICD-10-CM | POA: Diagnosis not present

## 2016-10-02 DIAGNOSIS — I10 Essential (primary) hypertension: Secondary | ICD-10-CM | POA: Diagnosis not present

## 2016-10-02 NOTE — Telephone Encounter (Signed)
Left detailed message ok PT for pt

## 2016-10-03 ENCOUNTER — Ambulatory Visit: Payer: Medicare Other | Admitting: Internal Medicine

## 2016-10-03 DIAGNOSIS — G6289 Other specified polyneuropathies: Secondary | ICD-10-CM | POA: Diagnosis not present

## 2016-10-03 DIAGNOSIS — M6281 Muscle weakness (generalized): Secondary | ICD-10-CM | POA: Diagnosis not present

## 2016-10-04 ENCOUNTER — Other Ambulatory Visit: Payer: Self-pay | Admitting: Licensed Clinical Social Worker

## 2016-10-04 NOTE — Patient Outreach (Signed)
[facility]) Care Management  10/04/2016  Cassandra StalkerBarbara Stewart 1950/04/21 [id number]  Assessment- CSW sent secure email [facility] services to inquire about patent's SCAT application since CSW has been unable to reach her by phone.   Plan-CSW will await to hear back from SCAT and continue to assist patient with gaining stable transportation.  [name] Lorilynn Lehr, BSW, MSW, LCSW [facility] Care Management [email or web address] Phone: [phone] Fax: [phone]

## 2016-10-07 DIAGNOSIS — Z87440 Personal history of urinary (tract) infections: Secondary | ICD-10-CM | POA: Diagnosis not present

## 2016-10-07 DIAGNOSIS — I10 Essential (primary) hypertension: Secondary | ICD-10-CM | POA: Diagnosis not present

## 2016-10-07 DIAGNOSIS — Z9181 History of falling: Secondary | ICD-10-CM | POA: Diagnosis not present

## 2016-10-07 DIAGNOSIS — K760 Fatty (change of) liver, not elsewhere classified: Secondary | ICD-10-CM | POA: Diagnosis not present

## 2016-10-07 DIAGNOSIS — Z794 Long term (current) use of insulin: Secondary | ICD-10-CM | POA: Diagnosis not present

## 2016-10-07 DIAGNOSIS — M4804 Spinal stenosis, thoracic region: Secondary | ICD-10-CM | POA: Diagnosis not present

## 2016-10-07 DIAGNOSIS — E1140 Type 2 diabetes mellitus with diabetic neuropathy, unspecified: Secondary | ICD-10-CM | POA: Diagnosis not present

## 2016-10-09 DIAGNOSIS — Z794 Long term (current) use of insulin: Secondary | ICD-10-CM | POA: Diagnosis not present

## 2016-10-09 DIAGNOSIS — I10 Essential (primary) hypertension: Secondary | ICD-10-CM | POA: Diagnosis not present

## 2016-10-09 DIAGNOSIS — M4804 Spinal stenosis, thoracic region: Secondary | ICD-10-CM | POA: Diagnosis not present

## 2016-10-09 DIAGNOSIS — Z87440 Personal history of urinary (tract) infections: Secondary | ICD-10-CM | POA: Diagnosis not present

## 2016-10-09 DIAGNOSIS — Z9181 History of falling: Secondary | ICD-10-CM | POA: Diagnosis not present

## 2016-10-09 DIAGNOSIS — E1140 Type 2 diabetes mellitus with diabetic neuropathy, unspecified: Secondary | ICD-10-CM | POA: Diagnosis not present

## 2016-10-09 DIAGNOSIS — K760 Fatty (change of) liver, not elsewhere classified: Secondary | ICD-10-CM | POA: Diagnosis not present

## 2016-10-11 ENCOUNTER — Other Ambulatory Visit: Payer: Self-pay | Admitting: Licensed Clinical Social Worker

## 2016-10-11 NOTE — Patient Outreach (Signed)
[facility]) Care Management  10/11/2016  Cassandra Stewart May 16, 1950 [id number]  Assessment- CSW received update from [place] with SCAT services that assessment appointment was scheduled for patient. CSW completed call to patient to see if she was able to attend assessment appointment. CSW unable to reach patient. HIPPA compliant voice message was left encouraging return call with updates.  Plan-CSW will continue to assist patient with gaining stable transportation.  [name] La, BSW, MSW, LCSW [employer] [email or web address] Phone: [phone] Fax: [phone]

## 2016-10-14 DIAGNOSIS — I10 Essential (primary) hypertension: Secondary | ICD-10-CM | POA: Diagnosis not present

## 2016-10-14 DIAGNOSIS — M4804 Spinal stenosis, thoracic region: Secondary | ICD-10-CM | POA: Diagnosis not present

## 2016-10-14 DIAGNOSIS — Z87440 Personal history of urinary (tract) infections: Secondary | ICD-10-CM | POA: Diagnosis not present

## 2016-10-14 DIAGNOSIS — K760 Fatty (change of) liver, not elsewhere classified: Secondary | ICD-10-CM | POA: Diagnosis not present

## 2016-10-14 DIAGNOSIS — Z794 Long term (current) use of insulin: Secondary | ICD-10-CM | POA: Diagnosis not present

## 2016-10-14 DIAGNOSIS — Z9181 History of falling: Secondary | ICD-10-CM | POA: Diagnosis not present

## 2016-10-14 DIAGNOSIS — E1140 Type 2 diabetes mellitus with diabetic neuropathy, unspecified: Secondary | ICD-10-CM | POA: Diagnosis not present

## 2016-10-17 DIAGNOSIS — M4804 Spinal stenosis, thoracic region: Secondary | ICD-10-CM | POA: Diagnosis not present

## 2016-10-21 ENCOUNTER — Other Ambulatory Visit: Payer: Self-pay | Admitting: Licensed Clinical Social Worker

## 2016-10-21 NOTE — Patient Outreach (Signed)
[facility]) Care Management  10/21/2016  Kayliegh Jerzak 08-Oct-1950 [id number]  Assessment-CSW completed outreach attempt today to follow up on SCAT. CSW unable to reach patient successfully. CSW left a HIPPA compliant voice message encouraging patient to return call once available.  Plan-CSW will await return call or complete an additional outreach if needed.  [name] La, BSW, MSW, LCSW [facility] [email or web address] Phone: [phone] Fax: [phone]

## 2016-10-23 NOTE — Progress Notes (Signed)
Subjective:    Patient ID: Cassandra Stewart, female    DOB: April 14, 1950, 66 y.o.   MRN: [id number]  Cassandra Stewart is a 66 y.o. female who presents for follow-up of Type 2 diabetes mellitus.   States she has been under a lot of stress due to her daughter passing away in late July.   States she has not been able to properly take care of herself due to a lack of transportation but now has [employer] for transportation. Can go grocery shopping now. States she is up and walking more, using a walker.  No longer having PT or OT.   Has appointment with her back surgeon Dr. Mikal Plane September 6th.  She has not seen Dr. Wyonia Hough yet for diabetes, not sure why, so she is back today for a diabetes visit. She is aware that I would like for her to start seeing Dr. Wyonia Hough for better diabetes management and she is in agreement.   Patient is checking home blood sugars.   Home blood sugar records: BGs range between 145 and 250 states she has  How often is blood sugars being checked: once a day Current symptoms include: none. Patient denies foot ulcerations, increased appetite, polydipsia, polyuria, visual disturbances, vomiting and weight loss.  Patient is checking their feet daily. Any Foot concerns (callous, ulcer, wound, thickened nails, toenail fungus, skin fungus, hammer toe): spot on right foot. Not painful Last dilated eye exam: had eye surgery 2 years ago  Current treatments: Metformin, Toujeo, Humalog.  Medication compliance: good  Current diet: in general, a "healthy" diet   Current exercise: walking Known diabetic complications: none   BP at home has been <130/80.   The following portions of the patient's history were reviewed and updated as appropriate: allergies, current medications, past medical history, past social history and problem list.  ROS as in subjective above.     Objective:    Physical Exam Alert and in no distress otherwise not examined.  Blood pressure 130/80, pulse (!)  59.  Lab Review Diabetic Labs Latest Ref Rng & Units 10/24/2016 07/25/2016 06/30/2016 03/04/2016 10/25/2015  HbA1c - 8.7% 9.6% - 10.5% 8.1%  Microalbumin Not estab mg/dL - 1.7 - - 40.9  Micro/Creat Ratio <30 mcg/mg creat - 16 - - 57(H)  Chol <200 mg/dL - - - 811 914  HDL >78 mg/dL - - - 70 67  Calc LDL <295 mg/dL - - - 88 92  Triglycerides <150 mg/dL - - - 91 621  Creatinine 0.44 - 1.00 mg/dL - - 3.08(M) 5.78 4.69   BP/Weight 10/24/2016 07/25/2016 06/30/2016 03/04/2016 10/25/2015  Systolic BP 130 136 149 132 128  Diastolic BP 80 78 59 80 82  Wt. (Lbs) - - 236 - 236.8  BMI - - 40.51 - 40.02   Foot/eye exam completion dates 07/25/2016  Foot Form Completion Done    [name]  reports that she has never smoked. She has never used smokeless tobacco. She reports that she does not drink alcohol or use drugs.     Assessment & Plan:    Uncontrolled type 2 diabetes with neuropathy (HCC) - Plan: HgB A1c, CBC with Differential/Platelet, COMPLETE METABOLIC PANEL WITH GFR, TSH  Essential hypertension - Plan: CBC with Differential/Platelet, COMPLETE METABOLIC PANEL WITH GFR  Morbid obesity (HCC) - Plan: CBC with Differential/Platelet, COMPLETE METABOLIC PANEL WITH GFR, TSH, Lipid panel  CKD (chronic kidney disease) stage 3, GFR 30-59 ml/min - Plan: COMPLETE METABOLIC PANEL WITH GFR  Fatty infiltration of liver  Hyperlipidemia,  unspecified hyperlipidemia type - Plan: Lipid panel  1. Rx changes: none hemoglobin A1c 8.7% she is not in agreement to check her blood sugars more often than once daily. Will defer to Dr. Wyonia HoughGerghe for help with her diabetes.  2. Education: Reviewed 'ABCs' of diabetes management (respective goals in parentheses):  A1C (<7), blood pressure (<130/80), and cholesterol (LDL <100). 3. Compliance at present is estimated to be fair. Efforts to improve compliance (if necessary) will be directed at dietary modifications: carbohydrates and sugar, increased exercise and regular blood sugar  monitoring: daily. 4. Follow up: with Dr. Wyonia HoughGerghe for diabetes managment. encouraged her to take her BS readings in to her first appointment  5. Discussed that she has CKD and keeping her BP at goal and getting her diabetes under better control will help prevent worsening disease.  6. She has not yet gone for a diabetic eye exam. Again, I encouraged her to do this.  7. I will see her back in 3-4 months for HTN in 3-4 months.

## 2016-10-24 ENCOUNTER — Encounter: Payer: Self-pay | Admitting: Family Medicine

## 2016-10-24 ENCOUNTER — Ambulatory Visit (INDEPENDENT_AMBULATORY_CARE_PROVIDER_SITE_OTHER): Payer: Medicare Other | Admitting: Family Medicine

## 2016-10-24 ENCOUNTER — Other Ambulatory Visit: Payer: Self-pay | Admitting: Licensed Clinical Social Worker

## 2016-10-24 VITALS — BP 130/80 | HR 59

## 2016-10-24 DIAGNOSIS — I10 Essential (primary) hypertension: Secondary | ICD-10-CM

## 2016-10-24 DIAGNOSIS — N183 Chronic kidney disease, stage 3 unspecified: Secondary | ICD-10-CM | POA: Insufficient documentation

## 2016-10-24 DIAGNOSIS — E1140 Type 2 diabetes mellitus with diabetic neuropathy, unspecified: Secondary | ICD-10-CM

## 2016-10-24 DIAGNOSIS — E1165 Type 2 diabetes mellitus with hyperglycemia: Secondary | ICD-10-CM

## 2016-10-24 DIAGNOSIS — K760 Fatty (change of) liver, not elsewhere classified: Secondary | ICD-10-CM | POA: Diagnosis not present

## 2016-10-24 DIAGNOSIS — IMO0002 Reserved for concepts with insufficient information to code with codable children: Secondary | ICD-10-CM

## 2016-10-24 DIAGNOSIS — E785 Hyperlipidemia, unspecified: Secondary | ICD-10-CM | POA: Diagnosis not present

## 2016-10-24 LAB — CBC WITH DIFFERENTIAL/PLATELET
BASOS PCT: 0 %
Basophils Absolute: 0 cells/uL (ref 0–200)
Eosinophils Absolute: 180 cells/uL (ref 15–500)
Eosinophils Relative: 3 %
HEMATOCRIT: 38.3 % (ref 35.0–45.0)
HEMOGLOBIN: 12.3 g/dL (ref 11.7–15.5)
LYMPHS ABS: 2460 {cells}/uL (ref 850–3900)
Lymphocytes Relative: 41 %
MCH: 28.8 pg (ref 27.0–33.0)
MCHC: 32.1 g/dL (ref 32.0–36.0)
MCV: 89.7 fL (ref 80.0–100.0)
MONO ABS: 480 {cells}/uL (ref 200–950)
MPV: 11.8 fL (ref 7.5–12.5)
Monocytes Relative: 8 %
NEUTROS PCT: 48 %
Neutro Abs: 2880 cells/uL (ref 1500–7800)
Platelets: 307 10*3/uL (ref 140–400)
RBC: 4.27 MIL/uL (ref 3.80–5.10)
RDW: 13.3 % (ref 11.0–15.0)
WBC: 6 10*3/uL (ref 4.0–10.5)

## 2016-10-24 LAB — POCT GLYCOSYLATED HEMOGLOBIN (HGB A1C): Hemoglobin A1C: 8.7

## 2016-10-24 NOTE — Patient Instructions (Addendum)
You are scheduled to see Dr. Wyonia HoughGerghe for your diabetes on 11/15/2016 at 11:15 am. Your hemoglobin A1c today is 8.7% and still above goal.   Your blood pressure is 130/80 and at goal.   Continue on current medications and now that you are able to do your own grocery shopping, please make healthy food choices.   Take your blood sugar readings in to see Dr. Wyonia HoughGerghe.   Call and schedule a diabetic eye exam at discussed. You are being given a business card with their information.  [phone]

## 2016-10-24 NOTE — Patient Outreach (Addendum)
[facility]) Care Management  10/24/2016  Cassandra StalkerBarbara Stewart [birth year] [id number]  Assessment-CSW completed outreach attempt today. CSW unable to reach patient successfully and was not able to leave a voice message as phone line continuously rang. CSW is aware that patient had PCP appointment which may be why she cannot answer phone call. CSW then received return call back from patient and she stated that she was currently at her PCP appointment and cannot talk. She is agreeable to contact CSW once she returns home.  Plan-CSW will await return call or complete an additional outreach if needed to follow up on social work needs before completing discharge.  Cassandra Stewart, BSW, MSW, LCSW [facility] Care Management [email or web address] Phone: [phone] Fax: [phone]

## 2016-10-25 ENCOUNTER — Other Ambulatory Visit: Payer: Self-pay | Admitting: Licensed Clinical Social Worker

## 2016-10-25 LAB — COMPLETE METABOLIC PANEL WITH GFR
ALBUMIN: 4.1 g/dL (ref 3.6–5.1)
ALK PHOS: 81 U/L (ref 33–130)
ALT: 24 U/L (ref 6–29)
AST: 24 U/L (ref 10–35)
BUN: 23 mg/dL (ref 7–25)
CALCIUM: 9.9 mg/dL (ref 8.6–10.4)
CO2: 24 mmol/L (ref 20–32)
CREATININE: 1.14 mg/dL — AB (ref 0.50–0.99)
Chloride: 108 mmol/L (ref 98–110)
GFR, Est African American: 57 mL/min — ABNORMAL LOW (ref >=60)
GFR, Est Non African American: 50 mL/min — ABNORMAL LOW (ref >=60)
Glucose, Bld: 73 mg/dL (ref 65–99)
Potassium: 5 mmol/L (ref 3.5–5.3)
Sodium: 144 mmol/L (ref 135–146)
Total Bilirubin: 0.4 mg/dL (ref 0.2–1.2)
Total Protein: 7 g/dL (ref 6.1–8.1)

## 2016-10-25 LAB — LIPID PANEL
Cholesterol: 177 mg/dL (ref <200)
HDL: 79 mg/dL (ref >50)
LDL CALC: 78 mg/dL (ref <100)
Total CHOL/HDL Ratio: 2.2 Ratio (ref <5.0)
Triglycerides: 101 mg/dL (ref <150)
VLDL: 20 mg/dL (ref <30)

## 2016-10-25 LAB — TSH: TSH: 2.09 m[IU]/L

## 2016-10-25 NOTE — Patient Outreach (Signed)
[facility]) Care Management  10/25/2016  Cassandra StalkerBarbara Stewart November 10, 1950 [id number]  Assessment-CSW did not receive return call from patient and completed another outreach attempt today. CSW unable to reach patient successfully. CSW left a HIPPA compliant voice message encouraging patient to return call once available.  Plan-CSW will await return call or complete an additional outreach if needed.  [name] Ahnesti Townsend, BSW, MSW, LCSW [facility] Care Management [email or web address] Phone: [phone] Fax: [phone]

## 2016-10-30 ENCOUNTER — Other Ambulatory Visit: Payer: Self-pay | Admitting: *Deleted

## 2016-10-30 NOTE — Patient Outreach (Signed)
Telephone follow up for coworker. I was not able to reach Ms. Maynez today, however, I did leave her a message that I would like to get an update on her health status and to please call me.  Zara Councilarroll C. Burgess EstelleSpinks, MSN, [facility]NP-BC Gerontological Nurse Practitioner [facility] Care Management [phone]

## 2016-10-31 ENCOUNTER — Other Ambulatory Visit: Payer: Self-pay | Admitting: *Deleted

## 2016-10-31 NOTE — Patient Outreach (Signed)
[facility] follow up. Advised pt her personal care manager is on LOA.  Pt reports she is improving. Getting around better, still using the wheelchair but is walking with the walker some. She had her endocrinology visit last week and her HgbA1c was down to 8.0 and it had been all the way up to 11.0. We celebrated her accomplishment. She also denies any falls.   I am going to send her the "ALL ABOUT CARBS" information and call her the last week in September to see if she has any questions about this.  Zara Councilarroll C. Burgess EstelleSpinks, MSN, [facility]NP-BC Gerontological Nurse Practitioner [facility] Care Management [phone]

## 2016-11-03 DIAGNOSIS — M6281 Muscle weakness (generalized): Secondary | ICD-10-CM | POA: Diagnosis not present

## 2016-11-03 DIAGNOSIS — G6289 Other specified polyneuropathies: Secondary | ICD-10-CM | POA: Diagnosis not present

## 2016-11-05 ENCOUNTER — Other Ambulatory Visit: Payer: Self-pay | Admitting: Licensed Clinical Social Worker

## 2016-11-05 NOTE — Patient Outreach (Signed)
[facility]) Care Management  11/05/2016  Cassandra Stewart 1950/05/23 [id number]  Assessment- CSW completed outreach call to patient and she answered successfully. CSW wanted to assess if there were any further social work needs. Patient confirms that she now has stable transportation through [place] and has already been using service to attend several physician appointments. She denies having any issues with this source of transportation. Patient shares that she has been doing "a lot better" physically and emotionally (since the loss of her daughter.) CSW reviewed grief resources again with patient but patient denies wanting to pursue those at this time. CSW provided emotional support, validation and reflective listening during phone call. Patient appreciative of social work assistance but denies any further social work needs at this time. Patient is agreeable to social work discharge at this time but will contact this CSW in the future if she has any social work needs.  [facility] CM Care Plan Problem One     Most Recent Value  Care Plan Problem One  Lack of overall community resources and support  Role Documenting the Problem One  Clinical Social Worker  Care Plan for Problem One  Active  [facility] Long Term Goal   Patient will gain [facility] services within 90 days  [facility] Long Term Goal Start Date  07/30/16  [facility] Long Term Goal Met Date  11/05/16  Interventions for Problem One Long Term Goal  CSW will complete home visit and complete [facility] application, will fax to [employer] and will monitor entire process to ensure that patient gains services appropriately. CSW will also provide other transportation resource information.      Plan-CSW will inform [facility] RNCM and PCP of social work discharge. CSW will discharge patient from caseload.  Eula Fried, BSW, MSW, [email or web address] Phone: [phone] Fax: [phone]

## 2016-11-15 ENCOUNTER — Ambulatory Visit (INDEPENDENT_AMBULATORY_CARE_PROVIDER_SITE_OTHER): Payer: Medicare Other | Admitting: Internal Medicine

## 2016-11-15 ENCOUNTER — Encounter: Payer: Self-pay | Admitting: Internal Medicine

## 2016-11-15 VITALS — BP 140/80 | HR 63

## 2016-11-15 DIAGNOSIS — E1140 Type 2 diabetes mellitus with diabetic neuropathy, unspecified: Secondary | ICD-10-CM

## 2016-11-15 DIAGNOSIS — R7989 Other specified abnormal findings of blood chemistry: Secondary | ICD-10-CM | POA: Diagnosis not present

## 2016-11-15 DIAGNOSIS — E1165 Type 2 diabetes mellitus with hyperglycemia: Secondary | ICD-10-CM | POA: Diagnosis not present

## 2016-11-15 DIAGNOSIS — IMO0002 Reserved for concepts with insufficient information to code with codable children: Secondary | ICD-10-CM

## 2016-11-15 DIAGNOSIS — E538 Deficiency of other specified B group vitamins: Secondary | ICD-10-CM

## 2016-11-15 MED ORDER — METFORMIN HCL ER 500 MG PO TB24: 500.0000 mg | ORAL_TABLET | Freq: Two times a day (BID) | ORAL | 3 refills | Status: DC

## 2016-11-15 MED ORDER — INSULIN LISPRO 100 UNIT/ML (KWIKPEN): PEN_INJECTOR | SUBCUTANEOUS | 3 refills | Status: DC

## 2016-11-15 MED ORDER — TOUJEO SOLOSTAR 300 UNIT/ML ~~LOC~~ SOPN: 45.0000 [IU] | PEN_INJECTOR | Freq: Every day | SUBCUTANEOUS | 3 refills | Status: DC

## 2016-11-15 MED ORDER — INSULIN PEN NEEDLE 32G X 4 MM MISC: 3 refills | Status: DC

## 2016-11-15 NOTE — Patient Instructions (Signed)
Please stop the regular metformin and change to Metformin ER 500 mg 2x a day with meals.  Please move Toujeo at night: 45 units a day. (Tomorrow take it at lunchtime)  Change Humalog: - 12-14 units for a smaller meal - 18 units for a regular meal - 20-22 units for a larger meal or if you have dessert Take Humalog 10-15 min before a meal.  Please return in 1.5 months with your sugar log.   Please let me know if the sugars are consistently <80 or >200.  PATIENT INSTRUCTIONS FOR TYPE 2 DIABETES:  **Please join MyChart!** - see attached instructions about how to join if you have not done so already.  DIET AND EXERCISE Diet and exercise is an important part of diabetic treatment.  We recommended aerobic exercise in the form of brisk walking (working between 40-60% of maximal aerobic capacity, similar to brisk walking) for 150 minutes per week (such as 30 minutes five days per week) along with 3 times per week performing 'resistance' training (using various gauge rubber tubes with handles) 5-10 exercises involving the major muscle groups (upper body, lower body and core) performing 10-15 repetitions (or near fatigue) each exercise. Start at half the above goal but build slowly to reach the above goals. If limited by weight, joint pain, or disability, we recommend daily walking in a swimming pool with water up to waist to reduce pressure from joints while allow for adequate exercise.    BLOOD GLUCOSES Monitoring your blood glucoses is important for continued management of your diabetes. Please check your blood glucoses 2-4 times a day: fasting, before meals and at bedtime (you can rotate these measurements - e.g. one day check before the 3 meals, the next day check before 2 of the meals and before bedtime, etc.).   HYPOGLYCEMIA (low blood sugar) Hypoglycemia is usually a reaction to not eating, exercising, or taking too much insulin/ other diabetes drugs.  Symptoms include tremors, sweating,  hunger, confusion, headache, etc. Treat IMMEDIATELY with 15 grams of Carbs: . 4 glucose tablets .  cup regular juice/soda . 2 tablespoons raisins . 4 teaspoons sugar . 1 tablespoon honey Recheck blood glucose in 15 mins and repeat above if still symptomatic/blood glucose <100.  RECOMMENDATIONS TO REDUCE YOUR RISK OF DIABETIC COMPLICATIONS: * Take your prescribed MEDICATION(S) * Follow a DIABETIC diet: Complex carbs, fiber rich foods, (monounsaturated and polyunsaturated) fats * AVOID saturated/trans fats, high fat foods, >2,300 mg salt per day. * EXERCISE at least 5 times a week for 30 minutes or preferably daily.  * DO NOT SMOKE OR DRINK more than 1 drink a day. * Check your FEET every day. Do not wear tightfitting shoes. Contact us if you develop an ulcer * See your EYE doctor once a year or more if needed * Get a FLU shot once a year * Get a PNEUMONIA vaccine once before and once after age 38 years  GOALS:  * Your Hemoglobin A1c of <7%  * fasting sugars need to be <130 * after meals sugars need to be <180 (2h after you start eating) * Your Systolic BP should be 140 or lower  * Your Diastolic BP should be 80 or lower  * Your HDL (Good Cholesterol) should be 40 or higher  * Your LDL (Bad Cholesterol) should be 100 or lower. * Your Triglycerides should be 150 or lower  * Your Urine microalbumin (kidney function) should be <30 * Your Body Mass Index should be 25 or lower  Please consider the following ways to cut down carbs and fat and increase fiber and micronutrients in your diet: - substitute whole grain for white bread or pasta - substitute brown rice for white rice - substitute 90-calorie flat bread pieces for slices of bread when possible - substitute sweet potatoes or yams for white potatoes - substitute humus for margarine - substitute tofu for cheese when possible - substitute almond or rice milk for regular milk (would not drink soy milk daily due to concern for  soy estrogen influence on breast cancer risk) - substitute dark chocolate for other sweets when possible - substitute water - can add lemon or orange slices for taste - for diet sodas (artificial sweeteners will trick your body that you can eat sweets without getting calories and will lead you to overeating and weight gain in the long run) - do not skip breakfast or other meals (this will slow down the metabolism and will result in more weight gain over time)  - can try smoothies made from fruit and almond/rice milk in am instead of regular breakfast - can also try old-fashioned (not instant) oatmeal made with almond/rice milk in am - order the dressing on the side when eating salad at a restaurant (pour less than half of the dressing on the salad) - eat as little meat as possible - can try juicing, but should not forget that juicing will get rid of the fiber, so would alternate with eating raw veg./fruits or drinking smoothies - use as little oil as possible, even when using olive oil - can dress a salad with a mix of balsamic vinegar and lemon juice, for e.g. - use agave nectar, stevia sugar, or regular sugar rather than artificial sweateners - steam or broil/roast veggies  - snack on veggies/fruit/nuts (unsalted, preferably) when possible, rather than processed foods - reduce or eliminate aspartame in diet (it is in diet sodas, chewing gum, etc) Read the labels!  Try to read Dr. Katherina Right book: "Program for Reversing Diabetes" for other ideas for healthy eating.

## 2016-11-15 NOTE — Progress Notes (Signed)
-Patient ID: Melaysia Streed, female   DOB: 19-Dec-1950, 66 y.o.   MRN: [id number]   HPI: Dotsie Gillette is a 66 y.o.-year-old female, referred by her PCP, Suezanne Jacquet, Vickie L, NP-C, for management of DM2, dx in 1992, insulin-dependent since 2006, uncontrolled, with complications (CKD stage 3, PN).  She is in a wheelchair as her L leg is very weak << believed from herniated intervertebral disks. She had back Sx in 2016, but strength did not improve.  Last hemoglobin A1c was: Lab Results  Component Value Date   HGBA1C 8.7% 10/24/2016   HGBA1C 9.6% 07/25/2016   HGBA1C 10.5% 03/04/2016   Pt is on a regimen of: - Metformin 500 mg 1x a day with dinner. Diarrhea with a higher dose. - Toujeo 45 units in am - Humalog 18 units 2-3x a day, before meals Tried: Actos, Lantus.  Pt checks her sugars 1x a day and they are: - am: 146-210 - 2h after b'fast: n/c - before lunch: n/c - 2h after lunch: n/c - before dinner: n/c - 2h after dinner: n/c - bedtime: n/c - nighttime: n/c Lowest sugar was 67; she has hypoglycemia awareness at 80s.  Highest sugar was 300s.  Glucometer: FreeStyle  Pt's meals are: - Breakfast/brunch: egg, bacon, toast - Lunch: salad or skips 3x a day - Dinner: chicken/fish + vegetables - Snacks: no  - + CKD stage 3, last BUN/creatinine:  Lab Results  Component Value Date   BUN 23 10/24/2016   BUN 24 (H) 06/30/2016   CREATININE 1.14 (H) 10/24/2016   CREATININE 1.13 (H) 06/30/2016   Lab Results  Component Value Date   GFRAA 57 (L) 10/24/2016   GFRAA 57 (L) 06/30/2016   GFRAA 71 03/04/2016   GFRAA 75 10/25/2015   GFRAA 73 09/13/2015  On Lisinopril 10. - last set of lipids: Lab Results  Component Value Date   CHOL 177 10/24/2016   HDL 79 10/24/2016   LDLCALC 78 10/24/2016   TRIG 101 10/24/2016   CHOLHDL 2.2 10/24/2016  On Lovastatin 40. - last eye exam was in 2016. No DR. + cataract sx OU. - + numbness and tingling in her feet. On Neurontin 300 mg  tid. On ASA 81.  Pt has FH of DM in M, MGM, PGM, M aunt, uncles.  05/01/2016: Vit B12 248.  ROS: Constitutional: no weight gain/loss, no fatigue, no subjective hyperthermia/hypothermia Eyes: no blurry vision, no xerophthalmia ENT: no sore throat, no nodules palpated in throat, no dysphagia/odynophagia, no hoarseness Cardiovascular: no CP/SOB/palpitations/+ leg swelling (L>R) Respiratory: no cough/SOB Gastrointestinal: no N/V/D/C Musculoskeletal: no muscle/+ joint aches Skin: no rashes Neurological: no tremors/numbness/tingling/dizziness Psychiatric: no depression/anxiety  Past Medical History:  Diagnosis Date  . Elevated LFTs   . Fatty liver    on [place].   Marland Kitchen Hyperlipemia   . Hypertension   . Uncontrolled type 2 diabetes with neuropathy [facility])    Past Surgical History:  Procedure Laterality Date  . APPENDECTOMY  1969  . LUMBAR DISC SURGERY    . LUMBAR LAMINECTOMY/DECOMPRESSION MICRODISCECTOMY N/A 07/06/2015   Procedure: Thoracic ten- eleven laminectomy for spinal canal decompression;  Surgeon: Coletta Memos, MD;  Location: [facility];  Service: Orthopedics;  Laterality: N/A;   Social History   Social History  . Marital status: Divorced    Spouse name: N/A  . Number of children: 0   Occupational History  . None   Social History Main Topics  . Smoking status: Never Smoker  . Smokeless tobacco: Never Used  .  Alcohol use No  . Drug use: No   Current Outpatient Prescriptions on File Prior to Visit  Medication Sig Dispense Refill  . ACCU-CHEK SOFTCLIX LANCETS lancets Test 3 times daily. Pt uses accu-chek softclix lancing device 200 each 3  . amLODipine (NORVASC) 10 MG tablet TAKE 1 TABLET BY MOUTH EVERY MORNING 90 tablet 0  . aspirin EC 81 MG tablet Take 81 mg by mouth every evening.    . Cholecalciferol (VITAMIN D PO) Take 1 tablet by mouth daily.    Marland Kitchen gabapentin (NEURONTIN) 300 MG capsule TAKE 1 CAPSULE BY MOUTH THREE TIMES DAILY 270 capsule 0  . glucose blood test  strip Test 3 times daily. Dx E11.9. Pt uses accu-chek aviva plus meter 200 each 3  . HUMALOG KWIKPEN 100 UNIT/ML KiwkPen INJECT 18 UNITS UNDER THE SKIN THREE TIMES DAILY 45 mL 0  . hydrochlorothiazide (MICROZIDE) 12.5 MG capsule TAKE 1 CAPSULE BY MOUTH DAILY 90 capsule 0  . lisinopril (PRINIVIL,ZESTRIL) 10 MG tablet TAKE 1 TABLET BY MOUTH EVERY MORNING 90 tablet 0  . lovastatin (MEVACOR) 40 MG tablet TAKE 1 TABLET BY MOUTH AT BEDTIME 90 tablet 0  . meloxicam (MOBIC) 7.5 MG tablet Take 7.5 mg by mouth daily as needed for pain. Reported on 09/13/2015    . metFORMIN (GLUCOPHAGE) 500 MG tablet TAKE 1 TABLET BY MOUTH TWICE DAILY WITH MEALS 180 tablet 0  . polyethylene glycol (MIRALAX / GLYCOLAX) packet Take 17 g by mouth daily as needed for mild constipation. 14 each 0  . TOUJEO SOLOSTAR 300 UNIT/ML SOPN INJECT 45 UNITS UNDER THE SKIN DAILY 22.5 pen 1   No current facility-administered medications on file prior to visit.    Allergies  Allergen Reactions  . Other     PT PREFERS TO NOT HAVE ANY NARCOTIC MEDICATIONS  . Chlorhexidine Rash    Burning after using CHG wipes-used for surgery  . Oxycodone Hcl     Other reaction(s): Hallucination Marked hallucinations and palpitations following dose of  on 04/30/2015    Family History  Problem Relation Age of Onset  . Diabetes Mother   . Heart disease Mother   . Kidney disease Brother        Two brothers on ESRD    PE: BP 140/80 (BP Location: Left Arm, Patient Position: Sitting)   Pulse 63   SpO2 96% ;patient could not be weighed, since she could not stand. Wt Readings from Last 3 Encounters:  06/30/16 236 lb (107 kg)  10/25/15 236 lb 12.8 oz (107.4 kg)  07/07/15 277 lb 12.5 oz (126 kg)   Constitutional: obese, in NAD, in wheelchair Eyes: PERRLA, EOMI, no exophthalmos ENT: moist mucous membranes, no thyromegaly, no cervical lymphadenopathy Cardiovascular: RRR, No MRG, + L>R LE mild edema Respiratory: CTA B Gastrointestinal: abdomen  soft, NT, ND, BS+ Musculoskeletal: no deformities, strength intact in all 4 Skin: moist, warm, no rashes Neurological: no tremor with outstretched hands, DTR normal in all 4  ASSESSMENT: 1. DM2, insulin-dependent, uncontrolled, with complications - CKD stage 3 - PN  2. Low B12  PLAN:  1. Patient with long-standing, uncontrolled diabetes, on oral antidiabetic regimen + basal-bolus insulin regimen, which is insufficient. However, reviewing her HbA1c Trend, her diabetes has improved in the last year. She tells me that she is trying to change her diet, but is difficult to evaluate her control since she is only checking sugars once a day in the morning. I strongly advised her to also check sugars later in the  day. She has pain in her fingers from fingersticks so I suggested that she gets the FreeStyle libre CGM and gave her paperwork. - At this visit, we discussed about the possibility of increasing the metformin to twice a day, however, she could not tolerate this dose in the past, so we'll try to change to metformin extended-release to see if she tolerates this better - We'll also try to move to show a night to improve the sugars in the morning - I will gave her a more flexible Humalog regimen, as she currently only has one dose to use and she eats different sized meals - I suggested to:  Patient Instructions  Please stop the regular metformin and change to Metformin ER 500 mg 2x a day with meals.  Please move Toujeo at night: 45 units a day. (Tomorrow take it at lunchtime)  Change Humalog: - 12-14 units for a smaller meal - 18 units for a regular meal - 20-22 units for a larger meal or if you have dessert Take Humalog 10-15 min before a meal.  Please return in 1.5 months with your sugar log.   Please let me know if the sugars are consistently <80 or >200.  - Strongly advised her to start checking sugars at different times of the day - check 2-3 times a day, rotating checks - given  sugar log and advised how to fill it and to bring it at next appt  - given foot care handout and explained the principles  - given instructions for hypoglycemia management "15-15 rule"  - advised for yearly eye exams  - Return to clinic in 1.5 mo with sugar log   2. Low B12 - reviewed level from 2016 >> low >> recommended to start vit B12 1000 mcg daily at least and have a repeat B12 check either with PCP or at our next visit  Carlus Pavlov, MD PhD [facility] Endocrinology

## 2016-11-17 DIAGNOSIS — M4804 Spinal stenosis, thoracic region: Secondary | ICD-10-CM | POA: Diagnosis not present

## 2016-11-18 ENCOUNTER — Other Ambulatory Visit: Payer: Self-pay | Admitting: *Deleted

## 2016-11-18 NOTE — Patient Outreach (Signed)
See previous notation.  Zara Council. Burgess Estelle, MSN, [facility] Gerontological Nurse Practitioner [facility] Care Management [phone]

## 2016-11-18 NOTE — Patient Outreach (Signed)
Follow up telephone assessment. Pt reports having received the ALL ABOUT CARBS education material and reports this is quite helpful. She has also seen her endocrinologist and her metformin was changed to the long acting and dosed bid and her sliding scale insulin was changed to suit the size of meal she has. Pt reports having had a hypoglycemic episode last night and this morning and she ate a snack last night and then ate an appropriate breakfast today.   I have requested an opportunity to come out and visit with her face to face and she reports she is "doing fine." I asked if I may call her again in 2 weeks and if she continues to feel like she is making progress I will close her case and she is in agreement with this plan.  Zara Council. Burgess Estelle, MSN, [facility] Gerontological Nurse Practitioner [facility] Care Management [phone]

## 2016-11-25 ENCOUNTER — Other Ambulatory Visit: Payer: Self-pay | Admitting: Family Medicine

## 2016-11-25 NOTE — Telephone Encounter (Signed)
Can I refill the gabapentin? I will refill all the other 4

## 2016-11-25 NOTE — Telephone Encounter (Signed)
This is ok

## 2016-11-27 ENCOUNTER — Telehealth: Payer: Self-pay | Admitting: *Deleted

## 2016-11-27 NOTE — Telephone Encounter (Signed)
Please advise, her med list states Metformin XR, isnt this the same medication?? Just wanted to check before I sent it in again. Thanks!

## 2016-11-27 NOTE — Telephone Encounter (Signed)
Patient called and states that Dr. Elvera Lennox prescribed her Metformin IR and her pharmacy did not receive the medication.  Patient is confused if it suppose to be Metformin IR or ER. Her AVS states she was suppose to  take the Metformin ER. Can you please resend the medication. Her pharmacy is [facility] on [address]. Thank you

## 2016-11-28 ENCOUNTER — Other Ambulatory Visit: Payer: Self-pay

## 2016-11-28 MED ORDER — METFORMIN HCL ER 500 MG PO TB24: 500.0000 mg | ORAL_TABLET | Freq: Two times a day (BID) | ORAL | 1 refills | Status: DC

## 2016-11-28 NOTE — Telephone Encounter (Signed)
ER

## 2016-11-28 NOTE — Telephone Encounter (Signed)
Attempted to contact patient, and advise of what medication Dr.Gherghe would like her to take, I changed it in the med list and submitted into the pharmacy. Will try to call again later, and let her know.   

## 2016-11-28 NOTE — Telephone Encounter (Signed)
Attempted to contact patient, and advise of what medication Dr.Gherghe would like her to take, I changed it in the med list and submitted into the pharmacy. Will try to call again later, and let her know.

## 2016-12-02 ENCOUNTER — Other Ambulatory Visit: Payer: Self-pay | Admitting: *Deleted

## 2016-12-02 NOTE — Patient Outreach (Addendum)
Follow up call to Cassandra Stewart to discuss her diabetes management. She was not at home. I was not able to leave a message. I will call her later today or within this week.  Zara Council. Burgess Estelle, MSN, GNP-BC Gerontological Nurse Practitioner [facility] Care Management [phone]  Called pt again this afternoon and still did not get an answer but I was able to leave a message and I requested that Cassandra Stewart call me back. I will call her again within the week.  Zara Council. Burgess Estelle, MSN, [facility] Gerontological Nurse Practitioner [facility] Care Management [phone]

## 2016-12-03 ENCOUNTER — Other Ambulatory Visit: Payer: Self-pay | Admitting: *Deleted

## 2016-12-03 DIAGNOSIS — G6289 Other specified polyneuropathies: Secondary | ICD-10-CM | POA: Diagnosis not present

## 2016-12-03 DIAGNOSIS — M6281 Muscle weakness (generalized): Secondary | ICD-10-CM | POA: Diagnosis not present

## 2016-12-03 NOTE — Patient Outreach (Signed)
Attempted telephone assessment to follow up on pt diabetes self management. I was able to leave a message requesting that Cassandra Stewart call me back. I will call her within the week and that will be my last call before sending a unable to reach and case closure letter.  Zara Council. Burgess Estelle, MSN, [facility] Gerontological Nurse Practitioner [facility] Care Management [phone]

## 2016-12-09 DIAGNOSIS — M4804 Spinal stenosis, thoracic region: Secondary | ICD-10-CM | POA: Diagnosis not present

## 2016-12-17 DIAGNOSIS — M4804 Spinal stenosis, thoracic region: Secondary | ICD-10-CM | POA: Diagnosis not present

## 2016-12-18 ENCOUNTER — Other Ambulatory Visit: Payer: Self-pay | Admitting: *Deleted

## 2016-12-18 ENCOUNTER — Encounter: Payer: Self-pay | Admitting: *Deleted

## 2016-12-18 NOTE — Patient Outreach (Signed)
Third telephone outreach without being able to contact Mrs. Skog. I have left her a message that I wanted to make sure she is well and she does not have any further care management needs. I also advised that I am closing her case and that I will send her a letter stating the same, along with advising her MD of her case closure.  Zara Councilarroll C. Burgess EstelleSpinks, MSN, [facility] Gerontological Nurse Practitioner [facility] Care Management [phone]

## 2016-12-25 ENCOUNTER — Telehealth: Payer: Self-pay | Admitting: Internal Medicine

## 2016-12-25 NOTE — Telephone Encounter (Signed)
Caller Name: Tressie StalkerBarbara Cherry  Best Number: [phone]    Reason for call:  Pt called very upset and stated that she no longer wanted to be a patient of Dr. Elvera LennoxGherghe.  Pt states that Dr. Elvera LennoxGherghe recommended that she test her blood sugar levels 3-4 times a day, but her fingers were very sore.  Patient had wanted the Klein[employer]re machine and says it was authorized by her insurance but they needed additional information from our office.  Patient states that when the insurance company called our office that someone from our office told the insurance company that she only needed to test her blood sugar levels once a day so she was disqualified from getting authorized for the machine.  She states that she will not be coming back, offered to give patient the referral number [phone], patient states she doesn't have anything to write with and won't call.  Please advise.  Best number to call pt is [phone]

## 2016-12-25 NOTE — Telephone Encounter (Signed)
Sorry to hear that. Noted

## 2016-12-27 ENCOUNTER — Ambulatory Visit: Payer: Medicare Other | Admitting: Internal Medicine

## 2017-01-03 DIAGNOSIS — M6281 Muscle weakness (generalized): Secondary | ICD-10-CM | POA: Diagnosis not present

## 2017-01-03 DIAGNOSIS — G6289 Other specified polyneuropathies: Secondary | ICD-10-CM | POA: Diagnosis not present

## 2017-01-17 DIAGNOSIS — M4804 Spinal stenosis, thoracic region: Secondary | ICD-10-CM | POA: Diagnosis not present

## 2017-01-29 ENCOUNTER — Ambulatory Visit (INDEPENDENT_AMBULATORY_CARE_PROVIDER_SITE_OTHER): Payer: Medicare Other | Admitting: Family Medicine

## 2017-01-29 ENCOUNTER — Encounter: Payer: Self-pay | Admitting: Family Medicine

## 2017-01-29 VITALS — BP 132/86 | HR 60 | Temp 97.7°F | Resp 16

## 2017-01-29 DIAGNOSIS — Z9119 Patient's noncompliance with other medical treatment and regimen: Secondary | ICD-10-CM

## 2017-01-29 DIAGNOSIS — I10 Essential (primary) hypertension: Secondary | ICD-10-CM

## 2017-01-29 DIAGNOSIS — IMO0002 Reserved for concepts with insufficient information to code with codable children: Secondary | ICD-10-CM

## 2017-01-29 DIAGNOSIS — R1032 Left lower quadrant pain: Secondary | ICD-10-CM | POA: Diagnosis not present

## 2017-01-29 DIAGNOSIS — Z91199 Patient's noncompliance with other medical treatment and regimen due to unspecified reason: Secondary | ICD-10-CM

## 2017-01-29 DIAGNOSIS — E1165 Type 2 diabetes mellitus with hyperglycemia: Secondary | ICD-10-CM | POA: Diagnosis not present

## 2017-01-29 DIAGNOSIS — E1140 Type 2 diabetes mellitus with diabetic neuropathy, unspecified: Secondary | ICD-10-CM

## 2017-01-29 HISTORY — DX: Patient's noncompliance with other medical treatment and regimen due to unspecified reason: Z91.199

## 2017-01-29 NOTE — Progress Notes (Signed)
Subjective:    Patient ID: Cassandra Stewart, female    DOB: Oct 11, 1950, 66 y.o.   MRN: [id number]  HPI Chief Complaint  Patient presents with  . Follow-up    diabetes  . Pain    Pt stated Lt lower side abdominal pain   She is here for a follow up on chronic health conditions.  She is in a motorized wheelchair.  States she used [employer] to get here. HTN- states she is taking her medications daily and no concerns. Does not check her BP at home.   Dr. Wyonia HoughGerghe is managing her diabetes. She has not been following up with her as recommended. States she canceled her last appointment with her because she does not check her blood sugar and did not have readings to take to her. She does not plan on checking her blood sugar. She reports taking insulin 3 times daily without knowing her blood sugars.  She did not get a diabetic eye exam as recommended on several occasions. States she will get this.   She has a new complaint today. Complains of a 3 month history of left lower quadrant pain that is sharp and intermittent. Lasts a few seconds to a minute. Occurs 3-4 times per day.  Does not appear to be related to eating or urinating.  Reports having normal bowel movements. Denies injury.   Such as when she is leaning forward.  States pain may be related to certain movements Denies taking anything for this.  States Dr. Jackelyn Knifeabell's office was supposed to refer her to a pain management specialist and states they did not. She did not follow up with them.   States she is walking with her walker at home but the majority of her time is spent sitting.   She is requesting that I sign off on a handicap placard for her today.  Denies fever, chills, dizziness, chest pain, palpitations, shortness of breath, N/V/D, urinary symptoms, LE edema.   Reviewed allergies, medications, past medical, surgical, family, and social history.   Review of Systems Pertinent positives and negatives in the history of present  illness.     Objective:   Physical Exam BP 132/86   Pulse 60   Temp 97.7 F (36.5 C)   Resp 16   SpO2 97%  Alert and in no distress. Pharyngeal area is normal.  Cardiac exam shows a regular sinus rhythm without murmurs or gallops. Lungs are clear to auscultation. Abdomen soft, non distended, non tender, bowel sounds present. Exam done with patient standing and sitting since she is unable to get on exam table.      Assessment & Plan:  Essential hypertension  Morbid obesity (HCC)  Uncontrolled type 2 diabetes with neuropathy (HCC)  Intermittent left lower quadrant abdominal pain  Personal history of noncompliance with medical treatment, presenting hazards to health  HTN-blood pressure is close to goal today.  She does not check her blood pressure at home.  She appears to be doing well on medications. Strongly encouraged her to call and schedule an appointment with Dr. Elvera LennoxGherghe for diabetes.  She verbalized understanding the potential risks involved with continuing to take insulin without knowing blood sugars. I recommend that she contact Dr. Franky Machoabbell who is managing her back pain and follow-up.  Apparently they were discussing referral to pain management and I think this would be a good idea. LLQ pain intermittent fleeting pains may be due to MSK etiology.  Recommend that she keep an eye on intermittent pain and  report back any new or worsening symptoms.  Discussed that pain is intermittent, brief and not worsening speaks of this being nothing serious. Handicap placard application done and given to patient.

## 2017-01-29 NOTE — Patient Instructions (Addendum)
Call and schedule your diabetic eye exam. [facility]. [phone]  Call and schedule an appointment with Dr. Wyonia HoughGerghe   Call Dr. Franky Machoabbell office and follow up with them regarding back pain.   Check your blood pressure periodically. Goal BP is <130/80. Your BP today is 132/86.

## 2017-02-02 DIAGNOSIS — G6289 Other specified polyneuropathies: Secondary | ICD-10-CM | POA: Diagnosis not present

## 2017-02-02 DIAGNOSIS — M6281 Muscle weakness (generalized): Secondary | ICD-10-CM | POA: Diagnosis not present

## 2017-02-16 DIAGNOSIS — M4804 Spinal stenosis, thoracic region: Secondary | ICD-10-CM | POA: Diagnosis not present

## 2017-03-05 DIAGNOSIS — M6281 Muscle weakness (generalized): Secondary | ICD-10-CM | POA: Diagnosis not present

## 2017-03-05 DIAGNOSIS — G6289 Other specified polyneuropathies: Secondary | ICD-10-CM | POA: Diagnosis not present

## 2017-03-07 NOTE — Progress Notes (Signed)
[facility] - Clinic Note  03/10/2017     CHIEF COMPLAINT Patient presents for Diabetic Eye Exam   HISTORY OF PRESENT ILLNESS: Cassandra Stewart is a 67 y.o. female who presents to the clinic today for:   HPI    Diabetic Eye Exam    Vision is stable.  Associated Symptoms Floaters.  Negative for Flashes, Blind Spot, Photophobia, Scalp Tenderness, Fever, Pain, Glare, Jaw Claudication, Weight Loss, Distortion, Redness, Trauma, Shoulder/Hip pain and Fatigue.  Diabetes characteristics include Type 2.  This started 26 years ago.  Blood sugar level fluctuates.  Last Blood Glucose: Does not check at home.  Last A1C: unknown.  I, the attending physician,  performed the HPI with the patient and updated documentation appropriately.          Comments    Pt present today on the referral of Dr. Suezanne Jacquet for DM exam, pt was dx 26 years ago, pt does not check blood sugar at home and her last A1C is unknown, pt states she had cataract sx in May 2016 and since then her vision has been good, pt states her eyes itch were her eyelashes are and her eyes are dry in the morning, pt states she has floaters OD, but denies flashes, pain or wavy vision, pt denies the use of gtts,        Last edited by Rennis Chris, MD on 03/10/2017 10:57 AM. (History)    Pt states she has not had a diabetic eye exam in a long time; Pt reports she moved to [place] in 2016 after "her leg stopped working"; Pt reports she lived in [place] prior to moving to [place], pt states she had cataract sx done by Dr. Thedore Mins at [facility] in 2016; Pt reports she does not check CBG at home due to pricking fingers is extremely painful; Pt states she hopes to get set up with with a monitor that "she could scan"; Pt reports she is taking insulin, states she takes 3 short term shots during the day and one long term at night, (45 units);   Referring physician: Avanell Shackleton, NP-C [address] [place], [place] [address]  HISTORICAL  INFORMATION:   Selected notes from the MEDICAL RECORD NUMBER Referred by V. Suezanne Jacquet, NP-C for DM exam LEE-  Ocular Hx- pseudophakia OU (2016, Dr[facility]ore [facility]ins at [facility]) PMH- DM2 on insulin, HTN,     CURRENT MEDICATIONS: No current outpatient medications on file. (Ophthalmic Drugs)   No current facility-administered medications for this visit.  (Ophthalmic Drugs)   Current Outpatient Medications (Other)  Medication Sig  . Acetaminophen (MAPAP) 500 MG coapsule Take by mouth.  Marland Kitchen albuterol (PROVENTIL HFA) 108 (90 Base) MCG/ACT inhaler Inhale into the lungs.  Marland Kitchen amLODipine (NORVASC) 10 MG tablet Take by mouth.  Marland Kitchen aspirin EC 81 MG tablet Take by mouth.  . Cholecalciferol (VITAMIN D-1000 MAX ST) 1000 units tablet Take by mouth.  . fluticasone (FLOVENT HFA) 220 MCG/ACT inhaler Inhale into the lungs.  . hydrochlorothiazide (HYDRODIURIL) 25 MG tablet Take by mouth.  . Insulin Pen Needle (B-D ULTRAFINE III SHORT PEN) 31G X 8 MM MISC 3 (three) times daily before meals.  Marland Kitchen lisinopril (PRINIVIL,ZESTRIL) 10 MG tablet Take by mouth.  . lovastatin (MEVACOR) 40 MG tablet Take by mouth.  . metFORMIN (GLUCOPHAGE) 500 MG tablet Take by mouth.  Marland Kitchen ACCU-CHEK SOFTCLIX LANCETS lancets Test 3 times daily. Pt uses accu-chek softclix lancing device  . amLODipine (NORVASC) 10 MG tablet  TAKE 1 TABLET BY MOUTH EVERY MORNING  . aspirin EC 81 MG tablet Take 81 mg by mouth every evening.  . Cholecalciferol (VITAMIN D PO) Take 1 tablet by mouth daily.  Marland Kitchen gabapentin (NEURONTIN) 300 MG capsule TAKE 1 CAPSULE BY MOUTH THREE TIMES DAILY  . glucose blood test strip Test 3 times daily. Dx E11.9. Pt uses accu-chek aviva plus meter  . hydrochlorothiazide (MICROZIDE) 12.5 MG capsule TAKE 1 CAPSULE BY MOUTH DAILY  . insulin lispro (HUMALOG KWIKPEN) 100 UNIT/ML KiwkPen INJECT 12-22 UNITS UNDER THE SKIN THREE TIMES DAILY  . Insulin Pen Needle (CAREFINE PEN NEEDLES) 32G X 4 MM MISC Use 4x a day  . lisinopril (PRINIVIL,ZESTRIL) 10 MG  tablet TAKE 1 TABLET BY MOUTH EVERY MORNING  . lovastatin (MEVACOR) 40 MG tablet TAKE 1 TABLET BY MOUTH AT BEDTIME  . meloxicam (MOBIC) 7.5 MG tablet Take 7.5 mg by mouth daily as needed for pain. Reported on 09/13/2015  . metFORMIN (GLUCOPHAGE-XR) 500 MG 24 hr tablet Take 1 tablet (500 mg total) by mouth 2 (two) times daily with a meal.  . polyethylene glycol (MIRALAX / GLYCOLAX) packet Take 17 g by mouth daily as needed for mild constipation.  Nathen May SOLOSTAR 300 UNIT/ML SOPN Inject 45 Units into the skin at bedtime.   No current facility-administered medications for this visit.  (Other)      REVIEW OF SYSTEMS:    ALLERGIES Allergies  Allergen Reactions  . Other     PT PREFERS TO NOT HAVE ANY NARCOTIC MEDICATIONS  . Chlorhexidine Rash    Burning after using CHG wipes-used for surgery  . Oxycodone Hcl     Other reaction(s): Hallucination Marked hallucinations and palpitations following dose of 10mg  on 04/30/2015     PAST MEDICAL HISTORY Past Medical History:  Diagnosis Date  . Elevated LFTs   . Fatty liver    on [place].   Marland Kitchen Hyperlipemia   . Hypertension   . Uncontrolled type 2 diabetes with neuropathy [facility])    Past Surgical History:  Procedure Laterality Date  . APPENDECTOMY  1969  . CATARACT EXTRACTION     May 40981  . LUMBAR DISC SURGERY    . LUMBAR LAMINECTOMY/DECOMPRESSION MICRODISCECTOMY N/A 07/06/2015   Procedure: Thoracic ten- eleven laminectomy for spinal canal decompression;  Surgeon: Coletta Memos, MD;  Location: [facility];  Service: Orthopedics;  Laterality: N/A;    FAMILY HISTORY Family History  Problem Relation Age of Onset  . Diabetes Mother   . Heart disease Mother   . Kidney disease Brother        Two brothers on ESRD  . Amblyopia Neg Hx   . Blindness Neg Hx   . Cataracts Neg Hx   . Glaucoma Neg Hx   . Macular degeneration Neg Hx   . Retinal detachment Neg Hx   . Strabismus Neg Hx   . Retinitis pigmentosa Neg Hx     SOCIAL HISTORY Social  History   Tobacco Use  . Smoking status: Never Smoker  . Smokeless tobacco: Never Used  Substance Use Topics  . Alcohol use: No  . Drug use: No         OPHTHALMIC EXAM:  Base Eye Exam    Visual Acuity (Snellen - Linear)      Right Left   Dist [place] 20/20 -2 20/20 -1   Dist ph [place] NI NI       Tonometry (Tonopen, 10:02 AM)      Right Left  Pressure 16 19       Pupils      Dark Light Shape React APD   Right 3 2 Round Minimal None   Left 3 2 Round Minimal None       Visual Fields (Counting fingers)      Left Right    Full Full       Extraocular Movement      Right Left    Full, Ortho Full, Ortho       Neuro/Psych    Oriented x3:  Yes   Mood/Affect:  Normal       Dilation    Both eyes:  1.0% Mydriacyl, 2.5% Phenylephrine @ 10:31 AM        Slit Lamp and Fundus Exam    External Exam      Right Left   External Normal Normal       Slit Lamp Exam      Right Left   Lids/Lashes Dermatochalasis - upper lid Dermatochalasis - upper lid   Conjunctiva/Sclera Superior mild Melanosis Melanosis   Cornea 1+ Punctate epithelial erosions, mild arcus; Well healed temporal cataract wounds 1+ central Punctate epithelial erosions, Arcus, Well healed temporal cataract wounds   Anterior Chamber Deep and quiet Deep and quiet   Iris Round and dilated, No NVI Round and dilated, No NVI   Lens PC IOL in good postion, trace Posterior capsular opacification / PC folds PC IOL in good postion, Inferior 1+ Posterior capsular opacification   Vitreous Vitreous syneresis, Posterior vitreous detachment Vitreous syneresis, Posterior vitreous detachment       Fundus Exam      Right Left   Disc Normal Mildly Tilted disc   C/D Ratio 0.55 0.6   Macula Inferior CWS along proximal inferior arcade, Good foveal reflex, few Microaneurysms Good foveal reflex, few Microaneurysms   Vessels Mild Vascular attenuation, mild AV crossing changes Mild venous beading, AV crossing changes   Periphery  Attached; rare scattered MA Attached; rare scattered MA        Refraction    Manifest Refraction (Retinoscopy)      Sphere Cylinder Axis Dist VA   Right [place] +0.75 005 20/20-2   Left -1.00 +1.00 083 20/20-1          IMAGING AND PROCEDURES  Imaging and Procedures for 03/10/17  OCT, Retina - OU - Both Eyes     Right Eye Quality was good. Central Foveal Thickness: 268. Progression has no prior data. Findings include normal foveal contour, no IRF, no SRF.   Left Eye Quality was good. Central Foveal Thickness: 270. Progression has no prior data. Findings include intraretinal fluid, normal foveal contour, no SRF (Trace cystic changes temporal to disc and inferotemporal to fovea).   Notes *Images captured and stored on drive  Diagnosis / Impression:  OD: NFP, No IRF/SRF OS: trace cystic changes, non-central; no CSME  Clinical management:  See below  Abbreviations: NFP - Normal foveal profile. CME - cystoid macular edema. PED - pigment epithelial detachment. IRF - intraretinal fluid. SRF - subretinal fluid. EZ - ellipsoid zone. ERM - epiretinal membrane. ORA - outer retinal atrophy. ORT - outer retinal tubulation. SRHM - subretinal hyper-reflective material                  ASSESSMENT/PLAN:    ICD-10-CM   1. 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 Mild nonproliferative diabetic retinopathy of both eyes without macular edema associated with type 2 diabetes  mellitus (HCC) Z61.0960E11.3293   3. Retinal edema H35.81 OCT, Retina - OU - Both Eyes    CANCELED: OCT, Retina - OU - Both Eyes  4. Essential hypertension I10   5. Hypertensive retinopathy of both eyes H35.033   6. Posterior vitreous detachment of both eyes H43.813   7. Pseudophakia of both eyes Z96.1     1-3. DM2 w/ Mild non-proliferative diabetic retinopathy, OU - The incidence, risk factors for progression, natural history and treatment options for diabetic retinopathy were discussed with  patient.   - The need for close monitoring of blood glucose, blood pressure, and serum lipids, avoiding cigarette or any type of tobacco, and the need for long term follow up was also discussed with patient. - exam with scattered Mas and few CWSs OU  - OCT without clinically significant diabetic macular edema OU -- just trace cystic changes OS - f/u in 3 mos -- repeat DFE, OCT and FA transit OS  4,5. Hypertensive retinopathy OU - mild - discussed importance of tight BP control - monitor   6. PVD / vitreous syneresis  asymptomatic  Discussed findings and prognosis  No RT or RD on 360 exam  Reviewed s/s of RT/RD  Strict return precautions for any such RT/RD signs/symptoms  7. Pseudophakia OU  - [facility] Dr. Thedore MinsSingh  - beautiful surgery, doing well  - monitor  Ophthalmic Meds Ordered this visit:  No orders of the defined types were placed in this encounter.      Return in about 3 months (around 06/08/2017) for F/U NPDR OU.  There are no Patient Instructions on file for this visit.   Explained the diagnoses, plan, and follow up with the patient and they expressed understanding.  Patient expressed understanding of the importance of proper follow up care.   This document serves as a record of services personally performed by Karie ChimeraBrian G. Edu On, MD, PhD. It was created on their behalf by Virgilio BellingMeredith Fabian, COA, a certified ophthalmic assistant. The creation of this record is the provider's dictation and/or activities during the visit.  Electronically signed by: Virgilio BellingMeredith Fabian, COA  03/10/17 2:11 PM    Karie ChimeraBrian G. Kyndra Condron, M.D., Ph.D. Diseases & Surgery of the Retina and Vitreous [facility] 03/10/17   I have reviewed the above documentation for accuracy and completeness, and I agree with the above. Karie ChimeraBrian G. Nayshawn Mesta, M.D., Ph.D. 03/10/17 2:11 PM     Abbreviations: M myopia (nearsighted); A astigmatism; H hyperopia (farsighted); P  presbyopia; Mrx spectacle prescription;  CTL contact lenses; OD right eye; OS left eye; OU both eyes  XT exotropia; ET esotropia; PEK punctate epithelial keratitis; PEE punctate epithelial erosions; DES dry eye syndrome; MGD meibomian gland dysfunction; ATs artificial tears; PFAT's preservative free artificial tears; NSC nuclear sclerotic cataract; PSC posterior subcapsular cataract; ERM epi-retinal membrane; PVD posterior vitreous detachment; RD retinal detachment; DM diabetes mellitus; DR diabetic retinopathy; NPDR non-proliferative diabetic retinopathy; PDR proliferative diabetic retinopathy; CSME clinically significant macular edema; DME diabetic macular edema; dbh dot blot hemorrhages; CWS cotton wool spot; POAG primary open angle glaucoma; C/D cup-to-disc ratio; HVF humphrey visual field; GVF goldmann visual field; OCT optical coherence tomography; IOP intraocular pressure; BRVO Branch retinal vein occlusion; CRVO central retinal vein occlusion; CRAO central retinal artery occlusion; BRAO branch retinal artery occlusion; RT retinal tear; SB scleral buckle; PPV pars plana vitrectomy; VH Vitreous hemorrhage; PRP panretinal laser photocoagulation; IVK intravitreal kenalog; VMT vitreomacular traction; MH Macular hole;  NVD neovascularization of the disc; NVE neovascularization elsewhere; AREDS age related eye disease study; ARMD age related macular degeneration; POAG primary open angle glaucoma; EBMD epithelial/anterior basement membrane dystrophy; ACIOL anterior chamber intraocular lens; IOL intraocular lens; PCIOL posterior chamber intraocular lens; Phaco/IOL phacoemulsification with intraocular lens placement; [place] photorefractive keratectomy; LASIK laser assisted in situ keratomileusis; HTN hypertension; DM diabetes mellitus; COPD chronic obstructive pulmonary disease

## 2017-03-10 ENCOUNTER — Ambulatory Visit (INDEPENDENT_AMBULATORY_CARE_PROVIDER_SITE_OTHER): Payer: Medicare Other | Admitting: Ophthalmology

## 2017-03-10 ENCOUNTER — Encounter (INDEPENDENT_AMBULATORY_CARE_PROVIDER_SITE_OTHER): Payer: Self-pay | Admitting: Ophthalmology

## 2017-03-10 DIAGNOSIS — H3581 Retinal edema: Secondary | ICD-10-CM

## 2017-03-10 DIAGNOSIS — H35033 Hypertensive retinopathy, bilateral: Secondary | ICD-10-CM

## 2017-03-10 DIAGNOSIS — Z961 Presence of intraocular lens: Secondary | ICD-10-CM

## 2017-03-10 DIAGNOSIS — E11319 Type 2 diabetes mellitus with unspecified diabetic retinopathy without macular edema: Secondary | ICD-10-CM

## 2017-03-10 DIAGNOSIS — I10 Essential (primary) hypertension: Secondary | ICD-10-CM | POA: Diagnosis not present

## 2017-03-10 DIAGNOSIS — E113293 Type 2 diabetes mellitus with mild nonproliferative diabetic retinopathy without macular edema, bilateral: Secondary | ICD-10-CM

## 2017-03-10 DIAGNOSIS — H35049 Retinal micro-aneurysms, unspecified, unspecified eye: Secondary | ICD-10-CM

## 2017-03-10 DIAGNOSIS — H43813 Vitreous degeneration, bilateral: Secondary | ICD-10-CM | POA: Diagnosis not present

## 2017-03-19 DIAGNOSIS — M4804 Spinal stenosis, thoracic region: Secondary | ICD-10-CM | POA: Diagnosis not present

## 2017-04-01 ENCOUNTER — Encounter: Payer: Self-pay | Admitting: Physical Medicine & Rehabilitation

## 2017-04-14 ENCOUNTER — Ambulatory Visit (INDEPENDENT_AMBULATORY_CARE_PROVIDER_SITE_OTHER): Payer: Medicare Other | Admitting: Internal Medicine

## 2017-04-14 ENCOUNTER — Encounter: Payer: Self-pay | Admitting: Internal Medicine

## 2017-04-14 VITALS — BP 143/72 | HR 67 | Ht 64.0 in | Wt 242.8 lb

## 2017-04-14 DIAGNOSIS — R7989 Other specified abnormal findings of blood chemistry: Secondary | ICD-10-CM | POA: Diagnosis not present

## 2017-04-14 DIAGNOSIS — E1165 Type 2 diabetes mellitus with hyperglycemia: Secondary | ICD-10-CM

## 2017-04-14 DIAGNOSIS — E1140 Type 2 diabetes mellitus with diabetic neuropathy, unspecified: Secondary | ICD-10-CM

## 2017-04-14 DIAGNOSIS — IMO0002 Reserved for concepts with insufficient information to code with codable children: Secondary | ICD-10-CM

## 2017-04-14 DIAGNOSIS — E538 Deficiency of other specified B group vitamins: Secondary | ICD-10-CM

## 2017-04-14 LAB — VITAMIN B12: Vitamin B-12: 300 pg/mL (ref 211–911)

## 2017-04-14 LAB — POCT GLYCOSYLATED HEMOGLOBIN (HGB A1C): Hemoglobin A1C: 9

## 2017-04-14 MED ORDER — DULAGLUTIDE 0.75 MG/0.5ML ~~LOC~~ SOAJ: SUBCUTANEOUS | 1 refills | Status: DC

## 2017-04-14 NOTE — Patient Instructions (Addendum)
Please continue: - Metformin ER 500 mg 2x a day with meals. - Toujeo 45 units at night - Humalog: - 12-14units for a smaller meal - 18 units for a regular meal - 20-22 units for a larger meal or if you have dessert Take Humalog 10-15 min before a meal.  Please start Trulicity 0.75 mg weekly. Let me know when you are close to running out to call in the higher dose to your pharmacy (1.5 mg).  Please return in 1.5 months with your sugar log.

## 2017-04-14 NOTE — Progress Notes (Signed)
-Patient ID: Cassandra Stewart, female   DOB: 25-Nov-1950, 67 y.o.   MRN: [id number]   HPI: Cassandra Stewart is a 67 y.o.-year-old female,-year-old female, returning for follow-up for DM2, dx in 1992, insulin-dependent since 2006, uncontrolled, with complications (CKD stage 3, PN, DR).  Last visit 5 months ago.  She continues to be in a wheelchair as her left leg is very weak.  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  She had surgery in 2016 but strength did not improve.   Last hemoglobin A1c was: Lab Results  Component Value Date   HGBA1C 8.7% 10/24/2016   HGBA1C 9.6% 07/25/2016   HGBA1C 10.5% 03/04/2016   Pt was on a regimen of: - Metformin 500 mg 1x a day with dinner.  She had diarrhea with a higher dose. - Toujeo 45 units in am - Humalog 18 units 2-3x a day, before meals Tried: Actos, Lantus.  At last visit, we changed to: - Metformin ER 500 mg 2x >> 1x a day with in am - Toujeo 45 units at night - Humalog: - 12-14 units for a smaller meal - 18 units for a regular meal  Take Humalog 10-15 min before a meal.  Pt checks her sugars 1x a day: - am: 146-210 >> 140-183 - 2h after b'fast: n/c - before lunch: n/c - 2h after lunch: n/c - before dinner: n/c - 2h after dinner: n/c - bedtime: n/c - nighttime: n/c Lowest sugar was 67 >> 67 (skipped meals); she has hypoglycemia awareness in the 80s. Highest sugar was 300s >> 183.  Glucometer: Freestyle  Pt's meals are: - Breakfast/brunch: egg, bacon, toast - Lunch: salad or skips 3x a day - Dinner: chicken/fish + vegetables - Snacks: no One meal a day from [employer].  -+ Stage III CKD, last BUN/creatinine:  Lab Results  Component Value Date   BUN 23 10/24/2016   BUN 24 (H) 06/30/2016   CREATININE 1.14 (H) 10/24/2016   CREATININE 1.13 (H) 06/30/2016   Lab Results  Component Value Date   GFRAA 57 (L) 10/24/2016   GFRAA 57 (L) 06/30/2016   GFRAA 71 03/04/2016   GFRAA 75 10/25/2015   GFRAA 73 09/13/2015   On lisinopril 10. -+ HL last set of lipids: Lab Results  Component Value Date   CHOL 177 10/24/2016   HDL 79 10/24/2016   LDLCALC 78 10/24/2016   TRIG 101 10/24/2016   CHOLHDL 2.2 10/24/2016  On lovastatin 40. - last eye exam was in 03/10/2017: + DR. + cataract sx OU. Goes back in 3 mo. - she has numbness and tingling in her feet. On Neurontin 300 mg tid. On ASA 81.  Pt has FH of DM in M, MGM, PGM, M aunt, uncles.  She has a history of low vitamin B12: 05/01/2016: Vit B12 248. Lab Results  Component Value Date   VITAMINB12 478 10/25/2015   At last visit, I recommended that she took 1000 mcg B12 daily.  ROS: Constitutional: no weight gain/no weight loss, no fatigue, no subjective hyperthermia, no subjective hypothermia Eyes: no blurry vision, no xerophthalmia ENT: no sore throat, no nodules palpated in throat, no dysphagia, no odynophagia, no hoarseness Cardiovascular: no CP/no SOB/no palpitations/no leg swelling Respiratory: no cough/no SOB/no wheezing Gastrointestinal: no N/no V/no D/no C/no acid reflux Musculoskeletal: no muscle aches/no joint aches Skin: no rashes, no hair loss Neurological: no tremors/+ numbness/+ tingling/no dizziness  I reviewed pt's medications, allergies, PMH, social hx, family hx, and changes were documented  in the history of present illness. Otherwise, unchanged from my initial visit note.  Past Medical History:  Diagnosis Date  . Elevated LFTs   . Fatty liver    on US.   Marland Kitchen. Hyperlipemia   . Hypertension   . Uncontrolled type 2 diabetes with neuropathy [facility](HCC)    Past Surgical History:  Procedure Laterality Date  . APPENDECTOMY  1969  . CATARACT EXTRACTION     May [id number]  . LUMBAR DISC SURGERY    . LUMBAR LAMINECTOMY/DECOMPRESSION MICRODISCECTOMY N/A 07/06/2015   Procedure: Thoracic ten- eleven laminectomy for spinal canal decompression;  Surgeon: Coletta MemosKyle Cabbell, MD;  Location: [facility];  Service: Orthopedics;  Laterality: N/A;   Social  History   Social History  . Marital status: Divorced    Spouse name: N/A  . Number of children: 0   Occupational History  . None   Social History Main Topics  . Smoking status: Never Smoker  . Smokeless tobacco: Never Used  . Alcohol use No  . Drug use: No   Current Outpatient Medications on File Prior to Visit  Medication Sig Dispense Refill  . ACCU-CHEK SOFTCLIX LANCETS lancets Test 3 times daily. Pt uses accu-chek softclix lancing device 200 each 3  . Acetaminophen (MAPAP) 500 MG coapsule Take by mouth.    Marland Kitchen. albuterol (PROVENTIL HFA) 108 (90 Base) MCG/ACT inhaler Inhale into the lungs.    Marland Kitchen. amLODipine (NORVASC) 10 MG tablet TAKE 1 TABLET BY MOUTH EVERY MORNING 90 tablet 1  . amLODipine (NORVASC) 10 MG tablet Take by mouth.    Marland Kitchen. aspirin EC 81 MG tablet Take 81 mg by mouth every evening.    Marland Kitchen. aspirin EC 81 MG tablet Take by mouth.    . Cholecalciferol (VITAMIN D PO) Take 1 tablet by mouth daily.    . Cholecalciferol (VITAMIN D-1000 MAX ST) 1000 units tablet Take by mouth.    . fluticasone (FLOVENT HFA) 220 MCG/ACT inhaler Inhale into the lungs.    . gabapentin (NEURONTIN) 300 MG capsule TAKE 1 CAPSULE BY MOUTH THREE TIMES DAILY 270 capsule 1  . glucose blood test strip Test 3 times daily. Dx E11.9. Pt uses accu-chek aviva plus meter 200 each 3  . hydrochlorothiazide (HYDRODIURIL) 25 MG tablet Take by mouth.    . hydrochlorothiazide (MICROZIDE) 12.5 MG capsule TAKE 1 CAPSULE BY MOUTH DAILY 90 capsule 1  . insulin lispro (HUMALOG KWIKPEN) 100 UNIT/ML KiwkPen INJECT 12-22 UNITS UNDER THE SKIN THREE TIMES DAILY 45 mL 3  . Insulin Pen Needle (B-D ULTRAFINE III SHORT PEN) 31G X 8 MM MISC 3 (three) times daily before meals.    . Insulin Pen Needle (CAREFINE PEN NEEDLES) 32G X 4 MM MISC Use 4x a day 300 each 3  . lisinopril (PRINIVIL,ZESTRIL) 10 MG tablet TAKE 1 TABLET BY MOUTH EVERY MORNING 90 tablet 1  . lisinopril (PRINIVIL,ZESTRIL) 10 MG tablet Take by mouth.    . lovastatin  (MEVACOR) 40 MG tablet TAKE 1 TABLET BY MOUTH AT BEDTIME 90 tablet 1  . lovastatin (MEVACOR) 40 MG tablet Take by mouth.    . meloxicam (MOBIC) 7.5 MG tablet Take 7.5 mg by mouth daily as needed for pain. Reported on 09/13/2015    . metFORMIN (GLUCOPHAGE-XR) 500 MG 24 hr tablet Take 1 tablet (500 mg total) by mouth 2 (two) times daily with a meal. (Patient taking differently: Take 500 mg by mouth daily. ) 90 tablet 1  . polyethylene glycol (MIRALAX / GLYCOLAX) packet Take 17  g by mouth daily as needed for mild constipation. 14 each 0  . TOUJEO SOLOSTAR 300 UNIT/ML SOPN Inject 45 Units into the skin at bedtime. 45 mL 3   No current facility-administered medications on file prior to visit.    Allergies  Allergen Reactions  . Other     PT PREFERS TO NOT HAVE ANY NARCOTIC MEDICATIONS  . Chlorhexidine Rash    Burning after using CHG wipes-used for surgery  . Oxycodone Hcl     Other reaction(s): Hallucination Marked hallucinations and palpitations following dose of 10mg  on 04/30/2015    Family History  Problem Relation Age of Onset  . Diabetes Mother   . Heart disease Mother   . Kidney disease Brother        Two brothers on ESRD  . Amblyopia Neg Hx   . Blindness Neg Hx   . Cataracts Neg Hx   . Glaucoma Neg Hx   . Macular degeneration Neg Hx   . Retinal detachment Neg Hx   . Strabismus Neg Hx   . Retinitis pigmentosa Neg Hx     PE: BP (!) 143/72 (BP Location: Left Arm, Patient Position: Sitting, Cuff Size: Large)   Pulse 67   Ht 5\' 4"  (1.626 m)   Wt 242 lb 12.8 oz (110.1 kg)   BMI 41.68 kg/m ; Wt Readings from Last 3 Encounters:  04/14/17 242 lb 12.8 oz (110.1 kg)  06/30/16 236 lb (107 kg)  10/25/15 236 lb 12.8 oz (107.4 kg)   Constitutional: Obese, in NAD, in wheelchair Eyes: PERRLA, EOMI, no exophthalmos ENT: moist mucous membranes, no thyromegaly, no cervical lymphadenopathy Cardiovascular: RRR, No MRG Respiratory: CTA B Gastrointestinal: abdomen soft, NT, ND,  BS+ Musculoskeletal: no deformities, strength intact in all 4 Skin: moist, warm, no rashes Neurological: no tremor with outstretched hands, DTR normal in all 4  ASSESSMENT: 1. DM2, insulin-dependent, uncontrolled, with complications - CKD stage 3 - PN - DR  2. Low B12  PLAN:  1. Patient with Long-standing, uncontrolled, diabetes, on oral antidiabetic regimen and basal-bolus insulin regimen, which we adjusted at last visit.   At last visit, I suggested to get the freestyle libre CGM and gave her paperwork.  However, her insurance did not cover this and she was very upset about this and threatened not to come back to see me.  However, she now returns after 5 months. - At last visit, we  increased the metformin to twice a day and change to the extended release formulation for better tolerance.  We also moved Toujeo at night to hopefully improve the sugars in the morning.  We also change the Humalog regimen to be more flexible as she was eating different sized meals. - Her sugars did not improved since last visit, possibly also due to the holidays. -At this visit, I suggested to start Trulicity to help with better mealtime coverage.  I explained that this will also help her lose weight, as opposed to increasing Humalog insulin which will give her higher  risk for low blood sugars and also weight gain - I suggested to:  Patient Instructions  Please continue: - Metformin ER 500 mg 2x a day with meals. - Toujeo 45 units at night - Humalog: - 12-14 units for a smaller meal - 18 units for a regular meal - 20-22 units for a larger meal or if you have dessert Take Humalog 10-15 min before a meal.  Please start Trulicity 0.75 mg weekly. Let me know when you  are close to running out to call in the higher dose to your pharmacy (1.5 mg).  Please return in 1.5 months with your sugar log.   - today, HbA1c is 9% (higher) - continue checking sugars at different times of the day - check 3x a day,  rotating checks - advised for yearly eye exams >> she is UTD - Return to clinic in 3 mo with sugar log   2. Low B12 - Reviewed previous levels: Low in 2018.  I recommended to start vitamin B12 1000 mcg daily  Component     Latest Ref Rng & Units 04/14/2017  Hemoglobin A1C      9.0  Vitamin B12     211 - 911 pg/mL 300   Vitamin B12 did not increase too much I will advised her to increase the dose to 5000 mcg daily.  Carlus Pavlov, MD PhD [facility] Endocrinology

## 2017-04-15 ENCOUNTER — Ambulatory Visit: Payer: Medicare Other | Admitting: Physical Medicine & Rehabilitation

## 2017-04-29 ENCOUNTER — Ambulatory Visit (HOSPITAL_BASED_OUTPATIENT_CLINIC_OR_DEPARTMENT_OTHER): Payer: Medicare Other | Admitting: Physical Medicine & Rehabilitation

## 2017-04-29 ENCOUNTER — Encounter: Payer: Medicare Other | Attending: Physical Medicine & Rehabilitation

## 2017-04-29 ENCOUNTER — Encounter: Payer: Self-pay | Admitting: Physical Medicine & Rehabilitation

## 2017-04-29 VITALS — BP 136/81 | HR 63 | Resp 14

## 2017-04-29 DIAGNOSIS — M5136 Other intervertebral disc degeneration, lumbar region: Secondary | ICD-10-CM | POA: Diagnosis not present

## 2017-04-29 DIAGNOSIS — M4804 Spinal stenosis, thoracic region: Secondary | ICD-10-CM | POA: Diagnosis not present

## 2017-04-29 DIAGNOSIS — M4714 Other spondylosis with myelopathy, thoracic region: Secondary | ICD-10-CM | POA: Diagnosis not present

## 2017-04-29 DIAGNOSIS — M50322 Other cervical disc degeneration at C5-C6 level: Secondary | ICD-10-CM | POA: Diagnosis not present

## 2017-04-29 DIAGNOSIS — G8220 Paraplegia, unspecified: Secondary | ICD-10-CM | POA: Diagnosis not present

## 2017-04-29 NOTE — Progress Notes (Signed)
Subjective:    Patient ID: Cassandra Stewart, female    DOB: 06/11/1950, 67 y.o.   MRN: [id number]  HPI 3 year hx of progressive BLE pain, which was first noted when going up and down stairs Initially sen at [facility] admitted without surgery for a few days and transferred to [facility].  LE weakness progressed a [facility] and was admitted to [facility] 5/8-5/15/2017 for inability to walk Had UTI, MRI of T spine showed severe stenosis T10-11 Underwent decompression 07/06/15, some improvement in pain Post op was working with therapy , walking with walker LLE pain- "bone is aching" Last MRI of Lumbar spine 11/08/15, didn't have any evidence of nerve root impingement  Hx of prior lumbar disc surgery ~1989  No bowel or bladder dysfunction, occ incont due to immobility Tylenol arthritis 2 tablets per day In motorized scooter  Mod I dressing and bathing Using walker household distance Lives alone, no steps in home  Last PT through home health in July  2018  EMG/[facility]CV at [facility] ~28yrs ago Neuropathy findings in BUE and BLE Last visit with Cabbell 01/2017, discussed low back pain and pt was subsequently referred Pain Inventory Average Pain 9 Pain Right Now 6 My pain is constant and aching  In the last 24 hours, has pain interfered with the following? General activity 9 Relation with others 9 Enjoyment of life 9 What TIME of day is your pain at its worst? morning, evening, night Sleep (in general) Fair  Pain is worse with: walking and standing Pain improves with: no selection Relief from Meds: no selection  Mobility ability to climb steps?  no do you drive?  no use a wheelchair  Function disabled: date disabled .  Neuro/Psych trouble walking  Prior Studies new visit CLINICAL DATA:  68 year old female with low back pain. 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 Status post thoracic spine surgery in May. Subsequent encounter.  EXAM: MRI  THORACIC SPINE WITHOUT AND WITH CONTRAST  TECHNIQUE: Multiplanar and multiecho pulse sequences of the thoracic spine were obtained without and with intravenous contrast.  CONTRAST:  20 mL MultiHance  COMPARISON:  Preoperative thoracic spine MRI 07/04/2015.  FINDINGS: Limited sagittal imaging of the cervical spine re- demonstrates evidence of moderate to severe disc and endplate degeneration at C5-C6 and C6-C7 (series 16, image 8).  Stable overall thoracic vertebral height and alignment since the preoperative study. Heterogeneous bone marrow signal which appears to be degenerative in nature throughout the thoracic spine, and is associated with widespread endplate spurring. Postoperative changes at the T10-T11 level are detailed below. No acute osseous abnormality identified.  Negative visualized thoracic and upper abdominal viscera. Negative posterior paraspinal soft tissues aside from postoperative granulation tissue from the T8 to the T11 levels with no postoperative fluid collection identified.  T1-T2: Mild facet hypertrophy.  T2-T3: Mild facet hypertrophy.  T3-T4: Mild uncovertebral hypertrophy.  T4-T5: Mild uncovertebral hypertrophy. Mild to moderate right facet hypertrophy. This level is stable.  T5-T6: Disc space loss with circumferential disc osteophyte complex. Stable narrowing of the ventral CSF space without significant spinal stenosis. Mild T5 foraminal stenosis. This level is stable.  T6-T7: Disc space loss. Circumferential disc osteophyte complex. Narrowing of the ventral CSF space without significant spinal stenosis. No foraminal stenosis. This level is stable.  T7-T8: Disc space loss with circumferential disc osteophyte complex. Narrowing the ventral CSF space. Mild ligament flavum hypertrophy. Overall mild spinal stenosis. Mild T7 foraminal stenosis. This level is stable.  T8-T9: Disc  space loss with circumferential disc osteophyte  complex. Moderate to severe facet hypertrophy. Ligament flavum hypertrophy. Spinal stenosis with no spinal cord mass effect. Mild T8 foraminal stenosis. This level is stable.  T9-T10: Circumferential disc osteophyte complex. Moderate to severe facet hypertrophy. Spinal stenosis with no definite spinal cord mass effect. Mild right T9 foraminal stenosis. This level is stable.  T10-T11: Stable trace increased T2 signal in the disc space. Sequelae of posterior decompression. Residual facet hypertrophy. Improved thecal sac patency. Questionable mild residual spinal cord signal abnormality here (series 23, image 8). Moderate to severe left and moderate right T10 foraminal stenosis appears stable.  T11-T12: Chronic circumferential disc osteophyte complex and moderate facet hypertrophy. Spinal stenosis with no definite spinal cord mass effect. Moderate left greater than right T11 foraminal stenosis. This level is stable.  T12-L1:  Minimal disc bulge.  Mild facet hypertrophy.  No stenosis.  L1-L2: Negative.  The conus medullaris appears normal at the L2 level. Aside from T10-T11, no thoracic spinal cord signal abnormality is identified. No abnormal intradural enhancement.  IMPRESSION: 1. Posterior decompression at T10-T11 with improved thecal sac patency at that level. 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 2. Other thoracic levels are stable since May. Multifactorial spinal stenosis from T7-T8 through T11-T12. No definite spinal cord signal abnormality. Moderate or severe degenerative foraminal stenosis at the T10 and T11 nerve levels. 3. Advanced cervical spine disc and endplate degeneration at C5-C6 and C6-C7 with suspected associated degenerative cervical spinal stenosis.   Electronically Signed   By: Odessa Fleming M.D.   On: 11/08/2015 17:34  MR LUMBAR SPINE FINDINGS  S1 is fully  lumbarized. There are 6 lumbarized vertebral segments the lowest of which will be labeled S1. This is counting from skullbase down. This places the severe spinal stenosis and cord hyperintensity at T10-11.  Normal lumbar alignment. Negative for fracture or mass lesion. Conus medullaris normal and terminates at approximately mid L3. No evidence of tethered cord or thickening of the filum terminale. Low lying conus medullaris felt to be related to an extra lumbar vertebral segment.  L1-2:  Negative  L2-3:  Negative  L3-4:  Negative  L4-5: Mild disc and mild facet degeneration causing mild spinal stenosis  L5-S1: Disc and moderate facet degeneration.  Mild spinal stenosis  S1-2: Postop changes on the left with epidural scarring. Disc degeneration and spondylosis without disc protrusion.  Physicians involved in your care  new visit   Family History  Problem Relation Age of Onset  . Diabetes Mother   . Heart disease Mother   . Kidney disease Brother        Two brothers on ESRD  . Amblyopia Neg Hx   . Blindness Neg Hx   . Cataracts Neg Hx   . Glaucoma Neg Hx   . Macular degeneration Neg Hx   . Retinal detachment Neg Hx   . Strabismus Neg Hx   . Retinitis pigmentosa Neg Hx    Social History   Socioeconomic History  . Marital status: Divorced    Spouse name: None  . Number of children: None  . Years of education: None  . Highest education level: None  Social Needs  . Financial resource strain: None  . Food insecurity - worry: None  . Food insecurity - inability: None  . Transportation needs - medical: None  . Transportation needs - non-medical: None  Occupational History  . None  Tobacco Use  .  Smoking status: Never Smoker  . Smokeless tobacco: Never Used  Substance and Sexual Activity  . Alcohol use: No  . Drug use: No  . Sexual activity: Not Currently  Other Topics Concern  . None  Social History Narrative  . None   Past Surgical History:    Procedure Laterality Date  . APPENDECTOMY  1969  . CATARACT EXTRACTION     May [id number]  . LUMBAR DISC SURGERY    . LUMBAR LAMINECTOMY/DECOMPRESSION MICRODISCECTOMY N/A 07/06/2015   Procedure: Thoracic ten- eleven laminectomy for spinal canal decompression;  Surgeon: Coletta Memos, MD;  Location: [facility];  Service: Orthopedics;  Laterality: N/A;   Past Medical History:  Diagnosis Date  . Elevated LFTs   . Fatty liver    on [place].   Marland Kitchen Hyperlipemia   . Hypertension   . Uncontrolled type 2 diabetes with neuropathy (HCC)    BP 136/81 (BP Location: Left Wrist, Patient Position: Sitting, Cuff Size: Small)   Pulse 63   Resp 14   SpO2 94%   Opioid Risk Score:   Fall Risk Score:  `1  Depression screen PHQ 2/9  Depression screen [facility] 2/9 08/05/2016 08/01/2016 07/29/2016 10/25/2015  Decreased Interest 0 0 0 0  Down, Depressed, Hopeless 0 0 0 0  PHQ - 2 Score 0 0 0 0    Review of Systems  Constitutional: Negative.   HENT: Negative.   Eyes: Negative.   Respiratory: Negative.   Cardiovascular: Negative.   Gastrointestinal: Negative.   Endocrine: Negative.   Genitourinary: Negative.   Musculoskeletal: Positive for arthralgias, back pain and gait problem.  Skin: Negative.   Allergic/Immunologic: Negative.   Hematological: Negative.   Psychiatric/Behavioral: Negative.        Objective:   Physical Exam  Constitutional: She is oriented to person, place, and time. She appears well-developed and well-nourished. No distress.  HENT:  Head: Normocephalic and atraumatic.  Eyes: Conjunctivae and EOM are normal. Pupils are equal, round, and reactive to light.  Neck: Normal range of motion. Neck supple.  Cardiovascular: Normal rate and regular rhythm.  No murmur heard. Pulmonary/Chest: Effort normal and breath sounds normal. No respiratory distress. She has no wheezes. She has no rales.  Abdominal: Soft. Bowel sounds are normal. She exhibits no distension. There is no tenderness.   Musculoskeletal:  There is tenderness palpation in the lumbar paraspinal area there is no tenderness palpation over the thoracic paraspinal or the thoracic spinous processes. Lumbar range of motion is reduced to approximately 25% flexion extension lateral bending and rotation.  She has some apprehension about leaning due to balance issues.   Neurological: She is alert and oriented to person, place, and time.  Motor strength is 5/5 bilateral deltoid bicep tricep grip 4/5 right hip flexor knee extensor ankle was flexor 3/5 in the left hip flexor knee extensor ankle was flexor Negative straight leg raising Sensation reduced to pinprick and light touch below the knees bilaterally.  Intact sensation in the hands.   Skin: Skin is warm and dry. She is not diaphoretic.  Psychiatric: She has a normal mood and affect. Her behavior is normal.  Nursing note and vitals reviewed.         Assessment & Plan:  1.  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  She could likely increase her left lower extremity strength as well as improve her mobility with walker.  2.  Lumbar pain is likely multifactorial with Lumbar DDD and  facet arthropathy Would rec increasing tylenol arthritis up to max of 2600mg  per day  If this is not helpful may consider lumbar facet injections Physical medicine rehabilitation follow-up in 6 weeks

## 2017-04-29 NOTE — Patient Instructions (Signed)
Tylenol up to 4 tabs a day

## 2017-06-05 NOTE — Progress Notes (Signed)
[facility] - Clinic Note  06/09/2017     CHIEF COMPLAINT Patient presents for Retina Follow Up   HISTORY OF PRESENT ILLNESS: Cassandra Stewart is a 67 y.o. female who presents to the clinic today for:   HPI    Retina Follow Up    Patient presents with  Other.  In right eye.  This started 4 months ago.  Severity is mild.  Since onset it is stable.  I, the attending physician,  performed the HPI with the patient and updated documentation appropriately.          Comments    F/U NPDR OU. Patient states her vision is stable, she has occasional floaters OD, no new onset. Pt. She has seasonal allergy's, her eyes are itching and watery when she goes outside. Pt does not monitor Bs, denies hypo/hyperglucemic episodes and visual changes.  A1C was in the 9s around Christmas of 2018.         Last edited by Rennis Chris, MD on 06/09/2017 11:17 AM. (History)    Pt states OU VA is stable; Pt states she has began a plant based diet, reports she is consuming mainly fish and very little meat;  Referring physician: Avanell Shackleton, NP-C [address] [place], [place] [address]  HISTORICAL INFORMATION:   Selected notes from the MEDICAL RECORD NUMBER Referred by V. Suezanne Jacquet, NP-C for DM exam LEE-  Ocular Hx- pseudophakia OU (2016, Dr. Thedore [place]ins [facility] [place]) PMH- DM2 on insulin, HTN,     CURRENT MEDICATIONS: No current outpatient medications on file. (Ophthalmic Drugs)   No current facility-administered medications for this visit.  (Ophthalmic Drugs)   Current Outpatient Medications (Other)  Medication Sig  . ACCU-CHEK SOFTCLIX LANCETS lancets Test 3 times daily. Pt uses accu-chek softclix lancing device  . acetaminophen (ACETAMINOPHEN 8 HOUR) 650 MG CR tablet Take 650 mg by mouth every 8 (eight) hours as needed for pain.  Marland Kitchen albuterol (PROVENTIL HFA) 108 (90 Base) MCG/ACT inhaler Inhale into the lungs.  Marland Kitchen amLODipine (NORVASC) 10 MG tablet TAKE 1 TABLET BY MOUTH  EVERY MORNING  . aspirin EC 81 MG tablet Take 81 mg by mouth every evening.  . Cholecalciferol (VITAMIN D PO) Take 1 tablet by mouth daily.  . Dulaglutide (TRULICITY) 0.75 MG/0.5ML SOPN Inject 0.75 mg weekly under skin  . gabapentin (NEURONTIN) 300 MG capsule TAKE 1 CAPSULE BY MOUTH THREE TIMES DAILY  . glucose blood test strip Test 3 times daily. Dx E11.9. Pt uses accu-chek aviva plus meter  . hydrochlorothiazide (MICROZIDE) 12.5 MG capsule TAKE 1 CAPSULE BY MOUTH DAILY  . insulin lispro (HUMALOG KWIKPEN) 100 UNIT/ML KiwkPen INJECT 12-22 UNITS UNDER THE SKIN THREE TIMES DAILY  . Insulin Pen Needle (B-D ULTRAFINE III SHORT PEN) 31G X 8 MM MISC 3 (three) times daily before meals.  . Insulin Pen Needle (CAREFINE PEN NEEDLES) 32G X 4 MM MISC Use 4x a day  . lisinopril (PRINIVIL,ZESTRIL) 10 MG tablet TAKE 1 TABLET BY MOUTH EVERY MORNING  . lovastatin (MEVACOR) 40 MG tablet TAKE 1 TABLET BY MOUTH AT BEDTIME  . metFORMIN (GLUCOPHAGE-XR) 500 MG 24 hr tablet Take 1 tablet (500 mg total) by mouth 2 (two) times daily with a meal. (Patient taking differently: Take 500 mg by mouth daily. )  . polyethylene glycol (MIRALAX / GLYCOLAX) packet Take 17 g by mouth daily as needed for mild constipation.  Nathen May SOLOSTAR 300 UNIT/ML SOPN Inject 45 Units into the skin at bedtime.  No current facility-administered medications for this visit.  (Other)      REVIEW OF SYSTEMS: ROS    Positive for: Musculoskeletal, Endocrine, Eyes   Negative for: Constitutional, Gastrointestinal, Neurological, Skin, Genitourinary, HENT, Cardiovascular, Respiratory, Psychiatric, Allergic/Imm, Heme/Lymph   Last edited by Eldridge Scot, LPN on 9/60/4540 10:13 AM. (History)       ALLERGIES Allergies  Allergen Reactions  . Other     PT PREFERS TO NOT HAVE ANY NARCOTIC MEDICATIONS  . Chlorhexidine Rash    Burning after using CHG wipes-used for surgery  . Oxycodone Hcl     Other reaction(s): Hallucination Marked  hallucinations and palpitations following dose of 10mg  on 04/30/2015     PAST MEDICAL HISTORY Past Medical History:  Diagnosis Date  . Elevated LFTs   . Fatty liver    on [place].   Marland Kitchen Hyperlipemia   . Hypertension   . Uncontrolled type 2 diabetes with neuropathy [facility])    Past Surgical History:  Procedure Laterality Date  . APPENDECTOMY  1969  . CATARACT EXTRACTION     May [address]  . LUMBAR DISC SURGERY    . LUMBAR LAMINECTOMY/DECOMPRESSION MICRODISCECTOMY N/A 07/06/2015   Procedure: Thoracic ten- eleven laminectomy for spinal canal decompression;  Surgeon: Coletta Memos, MD;  Location: [facility];  Service: Orthopedics;  Laterality: N/A;    FAMILY HISTORY Family History  Problem Relation Age of Onset  . Diabetes Mother   . Heart disease Mother   . Kidney disease Brother        Two brothers on ESRD  . Amblyopia Neg Hx   . Blindness Neg Hx   . Cataracts Neg Hx   . Glaucoma Neg Hx   . Macular degeneration Neg Hx   . Retinal detachment Neg Hx   . Strabismus Neg Hx   . Retinitis pigmentosa Neg Hx     SOCIAL HISTORY Social History   Tobacco Use  . Smoking status: Never Smoker  . Smokeless tobacco: Never Used  Substance Use Topics  . Alcohol use: No  . Drug use: No         OPHTHALMIC EXAM:  Base Eye Exam    Visual Acuity (Snellen - Linear)      Right Left   Dist [place] 20/20 20/20       Tonometry (Tonopen, 10:20 AM)      Right Left   Pressure 15 15       Pupils      Dark Light Shape React APD   Right 3 2 Round Minimal None   Left 3 2 Round Minimal None       Visual Fields (Counting fingers)      Left Right    Full Full       Extraocular Movement      Right Left    Full, Ortho Full, Ortho       Neuro/Psych    Oriented x3:  Yes   Mood/Affect:  Normal       Dilation    Both eyes:  1.0% Mydriacyl, 2.5% Phenylephrine @ 10:20 AM        Slit Lamp and Fundus Exam    External Exam      Right Left   External Normal Normal       Slit Lamp Exam       Right Left   Lids/Lashes Dermatochalasis - upper lid Dermatochalasis - upper lid   Conjunctiva/Sclera Superior mild Melanosis Melanosis   Cornea 1+ Punctate epithelial erosions, mild  arcus; Well healed temporal cataract wounds 1+ central Punctate epithelial erosions, Arcus, Well healed temporal cataract wounds   Anterior Chamber Deep and quiet Deep and quiet   Iris Round and dilated, No NVI Round and dilated, No NVI   Lens PC IOL in good postion, trace Posterior capsular opacification / PC folds PC IOL in good postion, Inferior 1+ Posterior capsular opacification   Vitreous Vitreous syneresis, Posterior vitreous detachment, Weiss ring Vitreous syneresis, Posterior vitreous detachment, Weiss ring       Fundus Exam      Right Left   Disc Pink and Sharp Mildly Tilted disc, Pink and Sharp   C/D Ratio 0.55 0.6   Macula Good foveal reflex, rare Microaneurysms, no edema Good foveal reflex, scattered Microaneurysms, no edema   Vessels Mild Vascular attenuation, mild AV crossing changes Mild venous beading, AV crossing changes   Periphery Attached; rare scattered MA Attached; rare scattered MA          IMAGING AND PROCEDURES  Imaging and Procedures for 06/09/17  OCT, Retina - OU - Both Eyes       Right Eye Quality was good. Central Foveal Thickness: 267. Progression has been stable. Findings include normal foveal contour, no IRF, no SRF.   Left Eye Quality was borderline. Central Foveal Thickness: 275. Progression has improved. Findings include normal foveal contour, no SRF, no IRF (Trace cystic changes temporal to disc resolved, and improved IRF inferotemporal to fovea).   Notes *Images captured and stored on drive  Diagnosis / Impression:  OD: NFP, No IRF/SRF OS: interval improvement of noncentral cystic changes  Clinical management:  See below  Abbreviations: NFP - Normal foveal profile. CME - cystoid macular edema. PED - pigment epithelial detachment. IRF - intraretinal  fluid. SRF - subretinal fluid. EZ - ellipsoid zone. ERM - epiretinal membrane. ORA - outer retinal atrophy. ORT - outer retinal tubulation. SRHM - subretinal hyper-reflective material         Fluorescein Angiography Optos (Transit OS)       Right Eye Progression has no prior data. Early phase findings include microaneurysm. Mid/Late phase findings include microaneurysm, leakage.   Left Eye Progression has no prior data. Early phase findings include microaneurysm. Mid/Late phase findings include microaneurysm, leakage.   Notes Impression:  Few, scattered MAs OU with late leakage; No NV Mild to moderate NPDR OU                ASSESSMENT/PLAN:    ICD-10-CM   1. 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 Mild nonproliferative diabetic retinopathy of both eyes without macular edema associated with type 2 diabetes mellitus (HCC) Z61.0960E11.3293 Fluorescein Angiography Optos (Transit OS)  3. Retinal edema H35.81 OCT, Retina - OU - Both Eyes  4. Essential hypertension I10   5. Hypertensive retinopathy of both eyes H35.033 Fluorescein Angiography Optos (Transit OS)  6. Posterior vitreous detachment of both eyes H43.813   7. Pseudophakia of both eyes Z96.1     1-3. DM2 w/ Mild non-proliferative diabetic retinopathy, OU - The incidence, risk factors for progression, natural history and treatment options for diabetic retinopathy were discussed with patient.   - The need for close monitoring of blood glucose, blood pressure, and serum lipids, avoiding cigarette or any type of tobacco, and the need for long term follow up was also discussed with patient. - exam with scattered MAs and few CWSs OU  - OCT without clinically significant diabetic macular edema  OU -- just trace cystic changes OS - FA today 4.15.19 -- no NV, just few Mas with mild late leakage - f/u in 6 mos -- repeat DFE, OCT  4,5. Hypertensive retinopathy OU -  mild - discussed importance of tight BP control - monitor  6. PVD / vitreous syneresis  asymptomatic  Discussed findings and prognosis  No RT or RD on 360 exam  Reviewed s/s of RT/RD  Strict return precautions for any such RT/RD signs/symptoms  7. Pseudophakia OU  - s/p CE/IOL in 2016, [facility], Dr. Thedore Mins  - beautiful surgery, doing well  - monitor  Ophthalmic Meds Ordered this visit:  No orders of the defined types were placed in this encounter.      Return in about 6 months (around 12/09/2017) for F/U NPDR OU, Dilated exam, OCT.  There are no Patient Instructions on file for this visit.   Explained the diagnoses, plan, and follow up with the patient and they expressed understanding.  Patient expressed understanding of the importance of proper follow up care.   This document serves as a record of services personally performed by Karie Chimera, MD, PhD. It was created on their behalf by Virgilio Belling, COA, a certified ophthalmic assistant. The creation of this record is the provider's dictation and/or activities during the visit.  Electronically signed by: Virgilio Belling, COA  06/09/17 3:47 PM   Karie Chimera, M.D., Ph.D. Diseases & Surgery of the Retina and Vitreous [facility] 06/09/17  I have reviewed the above documentation for accuracy and completeness, and I agree with the above. Karie Chimera, M.D., Ph.D. 06/09/17 3:48 PM     Abbreviations: M myopia (nearsighted); A astigmatism; H hyperopia (farsighted); P presbyopia; Mrx spectacle prescription;  CTL contact lenses; OD right eye; OS left eye; OU both eyes  XT exotropia; ET esotropia; PEK punctate epithelial keratitis; PEE punctate epithelial erosions; DES dry eye syndrome; MGD meibomian gland dysfunction; ATs artificial tears; PFAT's preservative free artificial tears; NSC nuclear sclerotic cataract; PSC posterior subcapsular cataract; ERM epi-retinal membrane; PVD posterior  vitreous detachment; RD retinal detachment; DM diabetes mellitus; DR diabetic retinopathy; NPDR non-proliferative diabetic retinopathy; PDR proliferative diabetic retinopathy; CSME clinically significant macular edema; DME diabetic macular edema; dbh dot blot hemorrhages; CWS cotton wool spot; POAG primary open angle glaucoma; C/D cup-to-disc ratio; HVF humphrey visual field; GVF goldmann visual field; OCT optical coherence tomography; IOP intraocular pressure; BRVO Branch retinal vein occlusion; CRVO central retinal vein occlusion; CRAO central retinal artery occlusion; BRAO branch retinal artery occlusion; RT retinal tear; SB scleral buckle; PPV pars plana vitrectomy; VH Vitreous hemorrhage; PRP panretinal laser photocoagulation; IVK intravitreal kenalog; VMT vitreomacular traction; MH Macular hole;  NVD neovascularization of the disc; NVE neovascularization elsewhere; AREDS age related eye disease study; ARMD age related macular degeneration; POAG primary open angle glaucoma; EBMD epithelial/anterior basement membrane dystrophy; ACIOL anterior chamber intraocular lens; IOL intraocular lens; PCIOL posterior chamber intraocular lens; Phaco/IOL phacoemulsification with intraocular lens placement; PRK photorefractive keratectomy; LASIK laser assisted in situ keratomileusis; HTN hypertension; DM diabetes mellitus; COPD chronic obstructive pulmonary disease

## 2017-06-09 ENCOUNTER — Encounter (INDEPENDENT_AMBULATORY_CARE_PROVIDER_SITE_OTHER): Payer: Self-pay | Admitting: Ophthalmology

## 2017-06-09 ENCOUNTER — Ambulatory Visit (INDEPENDENT_AMBULATORY_CARE_PROVIDER_SITE_OTHER): Payer: Medicare Other | Admitting: Ophthalmology

## 2017-06-09 DIAGNOSIS — E11319 Type 2 diabetes mellitus with unspecified diabetic retinopathy without macular edema: Secondary | ICD-10-CM | POA: Diagnosis not present

## 2017-06-09 DIAGNOSIS — H35049 Retinal micro-aneurysms, unspecified, unspecified eye: Secondary | ICD-10-CM

## 2017-06-09 DIAGNOSIS — I10 Essential (primary) hypertension: Secondary | ICD-10-CM | POA: Diagnosis not present

## 2017-06-09 DIAGNOSIS — H35033 Hypertensive retinopathy, bilateral: Secondary | ICD-10-CM | POA: Diagnosis not present

## 2017-06-09 DIAGNOSIS — E113293 Type 2 diabetes mellitus with mild nonproliferative diabetic retinopathy without macular edema, bilateral: Secondary | ICD-10-CM | POA: Diagnosis not present

## 2017-06-09 DIAGNOSIS — Z961 Presence of intraocular lens: Secondary | ICD-10-CM | POA: Diagnosis not present

## 2017-06-09 DIAGNOSIS — H43813 Vitreous degeneration, bilateral: Secondary | ICD-10-CM | POA: Diagnosis not present

## 2017-06-09 DIAGNOSIS — H3581 Retinal edema: Secondary | ICD-10-CM | POA: Diagnosis not present

## 2017-06-09 LAB — HM DIABETES EYE EXAM

## 2017-06-10 ENCOUNTER — Ambulatory Visit (HOSPITAL_BASED_OUTPATIENT_CLINIC_OR_DEPARTMENT_OTHER): Payer: Medicare Other | Admitting: Physical Medicine & Rehabilitation

## 2017-06-10 ENCOUNTER — Encounter: Payer: Self-pay | Admitting: Physical Medicine & Rehabilitation

## 2017-06-10 ENCOUNTER — Encounter: Payer: Medicare Other | Attending: Physical Medicine & Rehabilitation

## 2017-06-10 VITALS — BP 121/67 | HR 64 | Ht 64.5 in | Wt 236.0 lb

## 2017-06-10 DIAGNOSIS — G8220 Paraplegia, unspecified: Secondary | ICD-10-CM | POA: Diagnosis not present

## 2017-06-10 DIAGNOSIS — M4714 Other spondylosis with myelopathy, thoracic region: Secondary | ICD-10-CM | POA: Diagnosis not present

## 2017-06-10 DIAGNOSIS — M50322 Other cervical disc degeneration at C5-C6 level: Secondary | ICD-10-CM | POA: Insufficient documentation

## 2017-06-10 DIAGNOSIS — M5136 Other intervertebral disc degeneration, lumbar region: Secondary | ICD-10-CM | POA: Insufficient documentation

## 2017-06-10 DIAGNOSIS — M4804 Spinal stenosis, thoracic region: Secondary | ICD-10-CM | POA: Insufficient documentation

## 2017-06-10 NOTE — Progress Notes (Signed)
Subjective:    Patient ID: Cassandra Stewart, female    DOB: 1950/07/26, 67 y.o.   MRN: [id number] 3 year hx of progressive BLE pain, which was first noted when going up and down stairs Initially sen at [facility] admitted without surgery for a few days and transferred to [facility].  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  She has no pain in the left lower extremity.  This problem has been going on since her weakness developed in the lower limbs.  No falls or trauma to that area.  Secondary complaint is bilateral wrist pain.  This is exacerbated by pushing up when she transfers from her wheelchair to the bed or to other surfaces.  She has numbness and tingling in the fingertips.  She has had no falls or trauma to the area.  No prior surgery. Additional history of neuropathy on gabapentin Patient gets some partial relief of low back pain with Tylenol  Pain Inventory Average Pain 5 Pain Right Now 5 My pain is .  In the last 24 hours, has pain interfered with the following? General activity 6 Relation with others 5 Enjoyment of life 4 What TIME of day is your pain at its worst? night Sleep (in general) Fair  Pain is worse with: walking and standing Pain improves with: medication Relief from Meds:  .  Mobility use a walker ability to climb steps?  no do you drive?  no use a wheelchair transfers alone  Function disabled: date disabled .  Neuro/Psych weakness numbness trouble walking spasms depression  Prior Studies Any changes since last visit?  no  Physicians involved in your care Any changes since last visit?  no   Family History  Problem Relation Age of Onset  . Diabetes Mother   . Heart disease Mother   . Kidney disease Brother        Two brothers on ESRD  . Amblyopia Neg Hx   . Blindness Neg Hx   . Cataracts Neg Hx   . Glaucoma Neg Hx   . Macular degeneration Neg Hx   . Retinal detachment Neg Hx   . Strabismus Neg Hx   . Retinitis pigmentosa Neg Hx    Social History   Socioeconomic History  . Marital status: Divorced    Spouse name: Not on file  . Number of children: Not on file  . Years of education: Not on file  . Highest education level: Not on file  Occupational History  . Not on file  Social Needs  . Financial resource strain: Not on file  . Food insecurity:    Worry: Not on file    Inability: Not on file  . Transportation needs:    Medical: Not on file    Non-medical: Not on file  Tobacco  Use  . Smoking status: Never Smoker  . Smokeless tobacco: Never Used  Substance and Sexual Activity  . Alcohol use: No  . Drug use: No  . Sexual activity: Not Currently  Lifestyle  . Physical activity:    Days per week: Not on file    Minutes per session: Not on file  . Stress: Not on file  Relationships  . Social connections:    Talks on phone: Not on file    Gets together: Not on file    Attends religious service: Not on file    Active member of club or organization: Not on file    Attends meetings of clubs or organizations: Not on file    Relationship status: Not on file  Other Topics Concern  . Not on file  Social History Narrative  . Not on file   Past Surgical History:  Procedure Laterality Date  . APPENDECTOMY  1969  .  CATARACT EXTRACTION     May [address]  . LUMBAR DISC SURGERY    . LUMBAR LAMINECTOMY/DECOMPRESSION MICRODISCECTOMY N/A 07/06/2015   Procedure: Thoracic ten- eleven laminectomy for spinal canal decompression;  Surgeon: Coletta Memos, MD;  Location: [facility];  Service: Orthopedics;  Laterality: N/A;   Past Medical History:  Diagnosis Date  . Elevated LFTs   . Fatty liver    on [place].   Marland Kitchen Hyperlipemia   . Hypertension   . Uncontrolled type 2 diabetes with neuropathy (HCC)    There were no vitals taken for this visit.  Opioid Risk Score:   Fall Risk Score:  `1  Depression screen PHQ 2/9  Depression screen [facility] 2/9 08/05/2016 08/01/2016 07/29/2016 10/25/2015  Decreased Interest 0 0 0 0  Down, Depressed, Hopeless 0 0 0 0  PHQ - 2 Score 0 0 0 0     Review of Systems  Constitutional: Negative.   HENT: Negative.   Eyes: Negative.   Respiratory: Negative.   Cardiovascular: Negative.   Gastrointestinal: Negative.   Endocrine: Negative.   Genitourinary: Negative.   Musculoskeletal: Positive for arthralgias, gait problem and myalgias.  Skin: Negative.   Allergic/Immunologic: Negative.   Hematological: Negative.   Psychiatric/Behavioral: Negative.   All other systems reviewed and are negative.      Objective:   Physical Exam  Constitutional: She is oriented to person, place, and time. She appears well-developed and well-nourished. No distress.  HENT:  Head: Normocephalic and atraumatic.  Eyes: Pupils are equal, round, and reactive to light. Conjunctivae and EOM are normal.  Neurological: She is alert and oriented to person, place, and time.  Skin: Skin is warm and dry. She is not diaphoretic.  Psychiatric: She has a normal mood and affect.  Nursing note and vitals reviewed.   Motor strength is 5/5 bilateral deltoid bicep tricep grip 2- at the left hip flexor knee extensor ankle dorsiflexor 3- at the right hip flexor knee extensor ankle dorsiflexor. Sensation mildly diminished in  the second third and fourth digits of both hands. Negative Phalen's negative Tinel's Positive reverse Phalen's Lower extremity sensation is diminished below the knees bilaterally.      Assessment & Plan:  1.  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  Left lower extremity weakness greater than right lower extremity weakness.  She is using her upper extremities excessively for transfers and is developing some carpal tunnel syndrome. I will ask physical therapy to come out from home health to assess and work on lower extremity strengthening as well as transfers.  Also can work on stretching the left lower extremity   2.  Carpal tunnel syndrome as above wear wrist splints at night follow-up if no better consider EMG/NCV physical medicine rehab follow-up in 3 months   #3.  Lower extremity dependent edema on the left side.  This is related to her weakness.  We discussed elevating the lower extremity as well as compression hose.  She has difficulty donning compression hose.

## 2017-06-10 NOTE — Patient Instructions (Signed)
Please wear wrist splints at night to help with carpal tunnel symptoms Carpal Tunnel Syndrome Carpal tunnel syndrome is a condition that causes pain in your hand and arm. The carpal tunnel is a narrow area that is on the palm side of your wrist. Repeated wrist motion or certain diseases may cause swelling in the tunnel. This swelling can pinch the main nerve in the wrist (median nerve). Follow these instructions at home: If you have a splint:  Wear it as told by your doctor. Remove it only as told by your doctor.  Loosen the splint if your fingers: ? Become numb and tingle. ? Turn blue and cold.  Keep the splint clean and dry. General instructions  Take over-the-counter and prescription medicines only as told by your doctor.  Rest your wrist from any activity that may be causing your pain. If needed, talk to your employer about changes that can be made in your work, such as getting a wrist pad to use while typing.  If directed, apply ice to the painful area: ? Put ice in a plastic bag. ? Place a towel between your skin and the bag. ? Leave the ice on for 20 minutes, 2-3 times per day.  Keep all follow-up visits as told by your doctor. This is important.  Do any exercises as told by your doctor, physical therapist, or occupational therapist. Contact a doctor if:  You have new symptoms.  Medicine does not help your pain.  Your symptoms get worse. This information is not intended to replace advice given to you by your health care provider. Make sure you discuss any questions you have with your health care provider. Document Released: 01/31/2011 Document Revised: 07/20/2015 Document Reviewed: 06/29/2014 Elsevier Interactive Patient Education  [employer]2018 [employer]

## 2017-06-13 ENCOUNTER — Other Ambulatory Visit: Payer: Self-pay | Admitting: Family Medicine

## 2017-06-16 ENCOUNTER — Other Ambulatory Visit: Payer: Self-pay

## 2017-06-16 ENCOUNTER — Telehealth: Payer: Self-pay | Admitting: Internal Medicine

## 2017-06-16 DIAGNOSIS — R600 Localized edema: Secondary | ICD-10-CM | POA: Diagnosis not present

## 2017-06-16 DIAGNOSIS — G5600 Carpal tunnel syndrome, unspecified upper limb: Secondary | ICD-10-CM | POA: Diagnosis not present

## 2017-06-16 DIAGNOSIS — M2578 Osteophyte, vertebrae: Secondary | ICD-10-CM | POA: Diagnosis not present

## 2017-06-16 DIAGNOSIS — M25552 Pain in left hip: Secondary | ICD-10-CM | POA: Diagnosis not present

## 2017-06-16 DIAGNOSIS — G8220 Paraplegia, unspecified: Secondary | ICD-10-CM | POA: Diagnosis not present

## 2017-06-16 DIAGNOSIS — M4804 Spinal stenosis, thoracic region: Secondary | ICD-10-CM | POA: Diagnosis not present

## 2017-06-16 DIAGNOSIS — M50323 Other cervical disc degeneration at C6-C7 level: Secondary | ICD-10-CM | POA: Diagnosis not present

## 2017-06-16 DIAGNOSIS — Z794 Long term (current) use of insulin: Secondary | ICD-10-CM | POA: Diagnosis not present

## 2017-06-16 DIAGNOSIS — E1140 Type 2 diabetes mellitus with diabetic neuropathy, unspecified: Secondary | ICD-10-CM | POA: Diagnosis not present

## 2017-06-16 DIAGNOSIS — M48061 Spinal stenosis, lumbar region without neurogenic claudication: Secondary | ICD-10-CM | POA: Diagnosis not present

## 2017-06-16 DIAGNOSIS — M4714 Other spondylosis with myelopathy, thoracic region: Secondary | ICD-10-CM | POA: Diagnosis not present

## 2017-06-16 DIAGNOSIS — M791 Myalgia, unspecified site: Secondary | ICD-10-CM | POA: Diagnosis not present

## 2017-06-16 DIAGNOSIS — Z981 Arthrodesis status: Secondary | ICD-10-CM | POA: Diagnosis not present

## 2017-06-16 DIAGNOSIS — M4807 Spinal stenosis, lumbosacral region: Secondary | ICD-10-CM | POA: Diagnosis not present

## 2017-06-16 DIAGNOSIS — G8929 Other chronic pain: Secondary | ICD-10-CM | POA: Diagnosis not present

## 2017-06-16 DIAGNOSIS — E785 Hyperlipidemia, unspecified: Secondary | ICD-10-CM | POA: Diagnosis not present

## 2017-06-16 DIAGNOSIS — M5136 Other intervertebral disc degeneration, lumbar region: Secondary | ICD-10-CM | POA: Diagnosis not present

## 2017-06-16 DIAGNOSIS — I10 Essential (primary) hypertension: Secondary | ICD-10-CM | POA: Diagnosis not present

## 2017-06-16 DIAGNOSIS — M79605 Pain in left leg: Secondary | ICD-10-CM | POA: Diagnosis not present

## 2017-06-16 DIAGNOSIS — M79604 Pain in right leg: Secondary | ICD-10-CM | POA: Diagnosis not present

## 2017-06-16 DIAGNOSIS — M50322 Other cervical disc degeneration at C5-C6 level: Secondary | ICD-10-CM | POA: Diagnosis not present

## 2017-06-16 DIAGNOSIS — Z7982 Long term (current) use of aspirin: Secondary | ICD-10-CM | POA: Diagnosis not present

## 2017-06-16 MED ORDER — TOUJEO SOLOSTAR 300 UNIT/ML ~~LOC~~ SOPN: 45.0000 [IU] | PEN_INJECTOR | Freq: Every day | SUBCUTANEOUS | 3 refills | Status: DC

## 2017-06-16 NOTE — Telephone Encounter (Signed)
completed

## 2017-06-16 NOTE — Telephone Encounter (Signed)
Patient stated she need a 90 day supply of the Toujeo  (She called her pharmacy they told her to call here) Send to  [facility] [phone] [place] - [address] AT [facility] OF [address] [phone] (Phone) [phone] (Fax)

## 2017-06-17 ENCOUNTER — Telehealth: Payer: Self-pay | Admitting: *Deleted

## 2017-06-17 NOTE — Telephone Encounter (Signed)
Trey PaulaJeff, PT, [facility] left a message asking for verbal orders for HHPT 2week2 followed by 1week2 to address gait, balance training, lower extremity strengthening and fall prevention.  I consulted Dr. Wynn BankerKirsteins last clinic note and confimed.  Verbal orders given per office protocol

## 2017-06-23 DIAGNOSIS — M2578 Osteophyte, vertebrae: Secondary | ICD-10-CM | POA: Diagnosis not present

## 2017-06-23 DIAGNOSIS — Z981 Arthrodesis status: Secondary | ICD-10-CM | POA: Diagnosis not present

## 2017-06-23 DIAGNOSIS — M5136 Other intervertebral disc degeneration, lumbar region: Secondary | ICD-10-CM | POA: Diagnosis not present

## 2017-06-23 DIAGNOSIS — M48061 Spinal stenosis, lumbar region without neurogenic claudication: Secondary | ICD-10-CM | POA: Diagnosis not present

## 2017-06-23 DIAGNOSIS — M50323 Other cervical disc degeneration at C6-C7 level: Secondary | ICD-10-CM | POA: Diagnosis not present

## 2017-06-23 DIAGNOSIS — M4804 Spinal stenosis, thoracic region: Secondary | ICD-10-CM | POA: Diagnosis not present

## 2017-06-23 DIAGNOSIS — E1140 Type 2 diabetes mellitus with diabetic neuropathy, unspecified: Secondary | ICD-10-CM | POA: Diagnosis not present

## 2017-06-23 DIAGNOSIS — R600 Localized edema: Secondary | ICD-10-CM | POA: Diagnosis not present

## 2017-06-23 DIAGNOSIS — M4807 Spinal stenosis, lumbosacral region: Secondary | ICD-10-CM | POA: Diagnosis not present

## 2017-06-23 DIAGNOSIS — M25552 Pain in left hip: Secondary | ICD-10-CM | POA: Diagnosis not present

## 2017-06-23 DIAGNOSIS — G8929 Other chronic pain: Secondary | ICD-10-CM | POA: Diagnosis not present

## 2017-06-23 DIAGNOSIS — M791 Myalgia, unspecified site: Secondary | ICD-10-CM | POA: Diagnosis not present

## 2017-06-23 DIAGNOSIS — M4714 Other spondylosis with myelopathy, thoracic region: Secondary | ICD-10-CM | POA: Diagnosis not present

## 2017-06-23 DIAGNOSIS — G5600 Carpal tunnel syndrome, unspecified upper limb: Secondary | ICD-10-CM | POA: Diagnosis not present

## 2017-06-23 DIAGNOSIS — M79604 Pain in right leg: Secondary | ICD-10-CM | POA: Diagnosis not present

## 2017-06-23 DIAGNOSIS — M50322 Other cervical disc degeneration at C5-C6 level: Secondary | ICD-10-CM | POA: Diagnosis not present

## 2017-06-23 DIAGNOSIS — Z794 Long term (current) use of insulin: Secondary | ICD-10-CM | POA: Diagnosis not present

## 2017-06-23 DIAGNOSIS — I10 Essential (primary) hypertension: Secondary | ICD-10-CM | POA: Diagnosis not present

## 2017-06-23 DIAGNOSIS — G8220 Paraplegia, unspecified: Secondary | ICD-10-CM | POA: Diagnosis not present

## 2017-06-23 DIAGNOSIS — Z7982 Long term (current) use of aspirin: Secondary | ICD-10-CM | POA: Diagnosis not present

## 2017-06-23 DIAGNOSIS — M79605 Pain in left leg: Secondary | ICD-10-CM | POA: Diagnosis not present

## 2017-06-23 DIAGNOSIS — E785 Hyperlipidemia, unspecified: Secondary | ICD-10-CM | POA: Diagnosis not present

## 2017-06-24 ENCOUNTER — Telehealth: Payer: Self-pay | Admitting: Emergency Medicine

## 2017-06-24 NOTE — Telephone Encounter (Signed)
Called pt. No answer °

## 2017-06-24 NOTE — Telephone Encounter (Signed)
Pt called and stated her great grandson took her glucose meter and put it somewhere. She cant find it. She went and got an Accucheck guide meter but it take the drums and she does have any. Her other meter was a one touch meter. She is wondering if we have a one touch meter she can get or if she can get a prescription for the correct supplies for the accucheck meter. Please advise thanks.

## 2017-06-26 DIAGNOSIS — M791 Myalgia, unspecified site: Secondary | ICD-10-CM

## 2017-06-26 DIAGNOSIS — M50323 Other cervical disc degeneration at C6-C7 level: Secondary | ICD-10-CM | POA: Diagnosis not present

## 2017-06-26 DIAGNOSIS — M79604 Pain in right leg: Secondary | ICD-10-CM | POA: Diagnosis not present

## 2017-06-26 DIAGNOSIS — M50322 Other cervical disc degeneration at C5-C6 level: Secondary | ICD-10-CM | POA: Diagnosis not present

## 2017-06-26 DIAGNOSIS — E785 Hyperlipidemia, unspecified: Secondary | ICD-10-CM

## 2017-06-26 DIAGNOSIS — Z981 Arthrodesis status: Secondary | ICD-10-CM

## 2017-06-26 DIAGNOSIS — M2578 Osteophyte, vertebrae: Secondary | ICD-10-CM | POA: Diagnosis not present

## 2017-06-26 DIAGNOSIS — G8220 Paraplegia, unspecified: Secondary | ICD-10-CM | POA: Diagnosis not present

## 2017-06-26 DIAGNOSIS — G8929 Other chronic pain: Secondary | ICD-10-CM | POA: Diagnosis not present

## 2017-06-26 DIAGNOSIS — G5600 Carpal tunnel syndrome, unspecified upper limb: Secondary | ICD-10-CM

## 2017-06-26 DIAGNOSIS — M5136 Other intervertebral disc degeneration, lumbar region: Secondary | ICD-10-CM | POA: Diagnosis not present

## 2017-06-26 DIAGNOSIS — F329 Major depressive disorder, single episode, unspecified: Secondary | ICD-10-CM

## 2017-06-26 DIAGNOSIS — M48061 Spinal stenosis, lumbar region without neurogenic claudication: Secondary | ICD-10-CM | POA: Diagnosis not present

## 2017-06-26 DIAGNOSIS — E1140 Type 2 diabetes mellitus with diabetic neuropathy, unspecified: Secondary | ICD-10-CM

## 2017-06-26 DIAGNOSIS — M4807 Spinal stenosis, lumbosacral region: Secondary | ICD-10-CM | POA: Diagnosis not present

## 2017-06-26 DIAGNOSIS — M4804 Spinal stenosis, thoracic region: Secondary | ICD-10-CM | POA: Diagnosis not present

## 2017-06-26 DIAGNOSIS — I10 Essential (primary) hypertension: Secondary | ICD-10-CM

## 2017-06-26 DIAGNOSIS — R600 Localized edema: Secondary | ICD-10-CM

## 2017-06-26 DIAGNOSIS — Z7982 Long term (current) use of aspirin: Secondary | ICD-10-CM

## 2017-06-26 DIAGNOSIS — M25552 Pain in left hip: Secondary | ICD-10-CM | POA: Diagnosis not present

## 2017-06-26 DIAGNOSIS — M4714 Other spondylosis with myelopathy, thoracic region: Secondary | ICD-10-CM | POA: Diagnosis not present

## 2017-06-26 DIAGNOSIS — M79605 Pain in left leg: Secondary | ICD-10-CM | POA: Diagnosis not present

## 2017-06-26 DIAGNOSIS — Z794 Long term (current) use of insulin: Secondary | ICD-10-CM

## 2017-06-27 ENCOUNTER — Other Ambulatory Visit: Payer: Self-pay | Admitting: Family Medicine

## 2017-06-30 DIAGNOSIS — M50323 Other cervical disc degeneration at C6-C7 level: Secondary | ICD-10-CM | POA: Diagnosis not present

## 2017-06-30 DIAGNOSIS — M5136 Other intervertebral disc degeneration, lumbar region: Secondary | ICD-10-CM | POA: Diagnosis not present

## 2017-06-30 DIAGNOSIS — G8929 Other chronic pain: Secondary | ICD-10-CM | POA: Diagnosis not present

## 2017-06-30 DIAGNOSIS — M4807 Spinal stenosis, lumbosacral region: Secondary | ICD-10-CM | POA: Diagnosis not present

## 2017-06-30 DIAGNOSIS — Z981 Arthrodesis status: Secondary | ICD-10-CM | POA: Diagnosis not present

## 2017-06-30 DIAGNOSIS — Z794 Long term (current) use of insulin: Secondary | ICD-10-CM | POA: Diagnosis not present

## 2017-06-30 DIAGNOSIS — M79605 Pain in left leg: Secondary | ICD-10-CM | POA: Diagnosis not present

## 2017-06-30 DIAGNOSIS — E785 Hyperlipidemia, unspecified: Secondary | ICD-10-CM | POA: Diagnosis not present

## 2017-06-30 DIAGNOSIS — M4804 Spinal stenosis, thoracic region: Secondary | ICD-10-CM | POA: Diagnosis not present

## 2017-06-30 DIAGNOSIS — I10 Essential (primary) hypertension: Secondary | ICD-10-CM | POA: Diagnosis not present

## 2017-06-30 DIAGNOSIS — G8220 Paraplegia, unspecified: Secondary | ICD-10-CM | POA: Diagnosis not present

## 2017-06-30 DIAGNOSIS — M48061 Spinal stenosis, lumbar region without neurogenic claudication: Secondary | ICD-10-CM | POA: Diagnosis not present

## 2017-06-30 DIAGNOSIS — M4714 Other spondylosis with myelopathy, thoracic region: Secondary | ICD-10-CM | POA: Diagnosis not present

## 2017-06-30 DIAGNOSIS — E1140 Type 2 diabetes mellitus with diabetic neuropathy, unspecified: Secondary | ICD-10-CM | POA: Diagnosis not present

## 2017-06-30 DIAGNOSIS — Z7982 Long term (current) use of aspirin: Secondary | ICD-10-CM | POA: Diagnosis not present

## 2017-06-30 DIAGNOSIS — G5600 Carpal tunnel syndrome, unspecified upper limb: Secondary | ICD-10-CM | POA: Diagnosis not present

## 2017-06-30 DIAGNOSIS — M79604 Pain in right leg: Secondary | ICD-10-CM | POA: Diagnosis not present

## 2017-06-30 DIAGNOSIS — M791 Myalgia, unspecified site: Secondary | ICD-10-CM | POA: Diagnosis not present

## 2017-06-30 DIAGNOSIS — M50322 Other cervical disc degeneration at C5-C6 level: Secondary | ICD-10-CM | POA: Diagnosis not present

## 2017-06-30 DIAGNOSIS — M25552 Pain in left hip: Secondary | ICD-10-CM | POA: Diagnosis not present

## 2017-06-30 DIAGNOSIS — R600 Localized edema: Secondary | ICD-10-CM | POA: Diagnosis not present

## 2017-06-30 DIAGNOSIS — M2578 Osteophyte, vertebrae: Secondary | ICD-10-CM | POA: Diagnosis not present

## 2017-07-03 DIAGNOSIS — E1140 Type 2 diabetes mellitus with diabetic neuropathy, unspecified: Secondary | ICD-10-CM | POA: Diagnosis not present

## 2017-07-03 DIAGNOSIS — M50323 Other cervical disc degeneration at C6-C7 level: Secondary | ICD-10-CM | POA: Diagnosis not present

## 2017-07-03 DIAGNOSIS — G8220 Paraplegia, unspecified: Secondary | ICD-10-CM | POA: Diagnosis not present

## 2017-07-03 DIAGNOSIS — G8929 Other chronic pain: Secondary | ICD-10-CM | POA: Diagnosis not present

## 2017-07-03 DIAGNOSIS — M5136 Other intervertebral disc degeneration, lumbar region: Secondary | ICD-10-CM | POA: Diagnosis not present

## 2017-07-03 DIAGNOSIS — G5600 Carpal tunnel syndrome, unspecified upper limb: Secondary | ICD-10-CM | POA: Diagnosis not present

## 2017-07-03 DIAGNOSIS — Z981 Arthrodesis status: Secondary | ICD-10-CM | POA: Diagnosis not present

## 2017-07-03 DIAGNOSIS — R600 Localized edema: Secondary | ICD-10-CM | POA: Diagnosis not present

## 2017-07-03 DIAGNOSIS — M791 Myalgia, unspecified site: Secondary | ICD-10-CM | POA: Diagnosis not present

## 2017-07-03 DIAGNOSIS — M48061 Spinal stenosis, lumbar region without neurogenic claudication: Secondary | ICD-10-CM | POA: Diagnosis not present

## 2017-07-03 DIAGNOSIS — M2578 Osteophyte, vertebrae: Secondary | ICD-10-CM | POA: Diagnosis not present

## 2017-07-03 DIAGNOSIS — M4807 Spinal stenosis, lumbosacral region: Secondary | ICD-10-CM | POA: Diagnosis not present

## 2017-07-03 DIAGNOSIS — M4804 Spinal stenosis, thoracic region: Secondary | ICD-10-CM | POA: Diagnosis not present

## 2017-07-03 DIAGNOSIS — M79604 Pain in right leg: Secondary | ICD-10-CM | POA: Diagnosis not present

## 2017-07-03 DIAGNOSIS — M79605 Pain in left leg: Secondary | ICD-10-CM | POA: Diagnosis not present

## 2017-07-03 DIAGNOSIS — Z7982 Long term (current) use of aspirin: Secondary | ICD-10-CM | POA: Diagnosis not present

## 2017-07-03 DIAGNOSIS — M4714 Other spondylosis with myelopathy, thoracic region: Secondary | ICD-10-CM | POA: Diagnosis not present

## 2017-07-03 DIAGNOSIS — M25552 Pain in left hip: Secondary | ICD-10-CM | POA: Diagnosis not present

## 2017-07-03 DIAGNOSIS — I10 Essential (primary) hypertension: Secondary | ICD-10-CM | POA: Diagnosis not present

## 2017-07-03 DIAGNOSIS — M50322 Other cervical disc degeneration at C5-C6 level: Secondary | ICD-10-CM | POA: Diagnosis not present

## 2017-07-03 DIAGNOSIS — Z794 Long term (current) use of insulin: Secondary | ICD-10-CM | POA: Diagnosis not present

## 2017-07-03 DIAGNOSIS — E785 Hyperlipidemia, unspecified: Secondary | ICD-10-CM | POA: Diagnosis not present

## 2017-07-07 DIAGNOSIS — I10 Essential (primary) hypertension: Secondary | ICD-10-CM | POA: Diagnosis not present

## 2017-07-07 DIAGNOSIS — M791 Myalgia, unspecified site: Secondary | ICD-10-CM | POA: Diagnosis not present

## 2017-07-07 DIAGNOSIS — M79604 Pain in right leg: Secondary | ICD-10-CM | POA: Diagnosis not present

## 2017-07-07 DIAGNOSIS — E785 Hyperlipidemia, unspecified: Secondary | ICD-10-CM | POA: Diagnosis not present

## 2017-07-07 DIAGNOSIS — G8220 Paraplegia, unspecified: Secondary | ICD-10-CM | POA: Diagnosis not present

## 2017-07-07 DIAGNOSIS — Z794 Long term (current) use of insulin: Secondary | ICD-10-CM | POA: Diagnosis not present

## 2017-07-07 DIAGNOSIS — M2578 Osteophyte, vertebrae: Secondary | ICD-10-CM | POA: Diagnosis not present

## 2017-07-07 DIAGNOSIS — M50322 Other cervical disc degeneration at C5-C6 level: Secondary | ICD-10-CM | POA: Diagnosis not present

## 2017-07-07 DIAGNOSIS — M4804 Spinal stenosis, thoracic region: Secondary | ICD-10-CM | POA: Diagnosis not present

## 2017-07-07 DIAGNOSIS — M4714 Other spondylosis with myelopathy, thoracic region: Secondary | ICD-10-CM | POA: Diagnosis not present

## 2017-07-07 DIAGNOSIS — G5600 Carpal tunnel syndrome, unspecified upper limb: Secondary | ICD-10-CM | POA: Diagnosis not present

## 2017-07-07 DIAGNOSIS — M50323 Other cervical disc degeneration at C6-C7 level: Secondary | ICD-10-CM | POA: Diagnosis not present

## 2017-07-07 DIAGNOSIS — G8929 Other chronic pain: Secondary | ICD-10-CM | POA: Diagnosis not present

## 2017-07-07 DIAGNOSIS — Z7982 Long term (current) use of aspirin: Secondary | ICD-10-CM | POA: Diagnosis not present

## 2017-07-07 DIAGNOSIS — E1140 Type 2 diabetes mellitus with diabetic neuropathy, unspecified: Secondary | ICD-10-CM | POA: Diagnosis not present

## 2017-07-07 DIAGNOSIS — M5136 Other intervertebral disc degeneration, lumbar region: Secondary | ICD-10-CM | POA: Diagnosis not present

## 2017-07-07 DIAGNOSIS — Z981 Arthrodesis status: Secondary | ICD-10-CM | POA: Diagnosis not present

## 2017-07-07 DIAGNOSIS — M79605 Pain in left leg: Secondary | ICD-10-CM | POA: Diagnosis not present

## 2017-07-07 DIAGNOSIS — M25552 Pain in left hip: Secondary | ICD-10-CM | POA: Diagnosis not present

## 2017-07-07 DIAGNOSIS — M4807 Spinal stenosis, lumbosacral region: Secondary | ICD-10-CM | POA: Diagnosis not present

## 2017-07-07 DIAGNOSIS — M48061 Spinal stenosis, lumbar region without neurogenic claudication: Secondary | ICD-10-CM | POA: Diagnosis not present

## 2017-07-07 DIAGNOSIS — R600 Localized edema: Secondary | ICD-10-CM | POA: Diagnosis not present

## 2017-07-11 ENCOUNTER — Encounter: Payer: Self-pay | Admitting: Internal Medicine

## 2017-07-11 ENCOUNTER — Ambulatory Visit (INDEPENDENT_AMBULATORY_CARE_PROVIDER_SITE_OTHER): Payer: Medicare Other | Admitting: Internal Medicine

## 2017-07-11 VITALS — BP 98/50 | HR 59 | Temp 97.0°F

## 2017-07-11 DIAGNOSIS — E538 Deficiency of other specified B group vitamins: Secondary | ICD-10-CM | POA: Diagnosis not present

## 2017-07-11 DIAGNOSIS — E1165 Type 2 diabetes mellitus with hyperglycemia: Secondary | ICD-10-CM

## 2017-07-11 DIAGNOSIS — E1140 Type 2 diabetes mellitus with diabetic neuropathy, unspecified: Secondary | ICD-10-CM | POA: Diagnosis not present

## 2017-07-11 DIAGNOSIS — IMO0002 Reserved for concepts with insufficient information to code with codable children: Secondary | ICD-10-CM

## 2017-07-11 DIAGNOSIS — E785 Hyperlipidemia, unspecified: Secondary | ICD-10-CM | POA: Diagnosis not present

## 2017-07-11 LAB — POCT GLYCOSYLATED HEMOGLOBIN (HGB A1C): Hemoglobin A1C: 9

## 2017-07-11 MED ORDER — ACCU-CHEK AVIVA PLUS W/DEVICE KIT: PACK | 1 refills | Status: DC

## 2017-07-11 MED ORDER — GLUCOSE BLOOD VI STRP: ORAL_STRIP | 11 refills | Status: DC

## 2017-07-11 MED ORDER — DULAGLUTIDE 0.75 MG/0.5ML ~~LOC~~ SOAJ: SUBCUTANEOUS | 1 refills | Status: DC

## 2017-07-11 NOTE — Progress Notes (Signed)
-Patient ID: Karee Forge, female   DOB: [birth year], 67 y.o.   MRN: [id number]   HPI: Darian Ace is a 67 y.o.-year-old female, returning for follow-up for DM2, dx in 1992, insulin-dependent since 2006, uncontrolled, with complications (CKD stage 3, PN, DR).  Last visit 3 months ago.  Since last visit, she did not have a meter so she did not check sugars.  She got 1 m from PCP but did not have the strips for it.  She also did not start the Trulicity as I suggested and she was not called by the pharmacy.  Last hemoglobin A1c was: Lab Results  Component Value Date   HGBA1C 9.0 04/14/2017   HGBA1C 8.7% 10/24/2016   HGBA1C 9.6% 07/25/2016   Pt was on a regimen of: - Metformin 500 mg 1x a day with dinner.  She had diarrhea with a higher dose. - Toujeo 45 units in am - Humalog 18 units 2-3x a day, before meals Tried: Actos, Lantus.  Now on: - Metformin ER 500 mg 2x a day with meals. - Toujeo 45 units at night - Humalog: - 12-14 units for a smaller meal - 18 units for a regular meal - 20-22 units for a larger meal or if you have dessert Take Humalog 10-15 min before a meal - Trulicity 1.5 mg weekly - did not start  Pt is not checking sugars>> no meter.  From last visit: - am: 146-210 >> 140-183 - 2h after b'fast: n/c - before lunch: n/c - 2h after lunch: n/c - before dinner: n/c - 2h after dinner: n/c - bedtime: n/c - nighttime: n/c Lowest sugar was 67 >> 67 (skipped meals) >> ?; she has hypoglycemia awareness in the 80s. Highest sugar was 300s >> 183 >> ?Marland Kitchen  Glucometer: Freestyle  Pt's meals are: - Breakfast/brunch: egg, bacon, toast - Lunch: salad or skips 3x a day - Dinner: chicken/fish + vegetables - Snacks: no One meal a day from [employer].  -+ stage 3 CKD, last BUN/creatinine:  Lab Results  Component Value Date   BUN 23 10/24/2016   BUN 24 (H) 06/30/2016   CREATININE 1.14 (H) 10/24/2016   CREATININE 1.13 (H) 06/30/2016   Lab Results   Component Value Date   GFRAA 57 (L) 10/24/2016   GFRAA 57 (L) 06/30/2016   GFRAA 71 03/04/2016   GFRAA 75 10/25/2015   GFRAA 73 09/13/2015  On Lisinopril 10.. -+ HL last set of lipids: Lab Results  Component Value Date   CHOL 177 10/24/2016   HDL 79 10/24/2016   LDLCALC 78 10/24/2016   TRIG 101 10/24/2016   CHOLHDL 2.2 10/24/2016  On Lovastatin 40. - last eye exam was in 02/2017: + DR. + cataract sx OU.  - + numbness and tingling in her feet. On Neurontin 300 mg 3x a day. On ASA 81.  Low vitamin B12:  Lab Results  Component Value Date   VITAMINB12 300 04/14/2017   VITAMINB12 478 10/25/2015  05/01/2016: Vit B12 248.  At last visit, we increased her supplement dose to 5000 mcg B12 daily.  She continues to be in a wheelchair as her left leg is very weak.  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  She had surgery in 2016 but strength did not improve.   ROS: Constitutional: no weight gain/no weight loss, no fatigue, no subjective hyperthermia, no subjective hypothermia Eyes: no blurry vision, no xerophthalmia ENT: no sore throat, no nodules palpated in throat, no dysphagia,  no odynophagia, no hoarseness Cardiovascular: no CP/no SOB/no palpitations/+ leg swelling Respiratory: no cough/no SOB/no wheezing Gastrointestinal: no N/no V/no D/no C/no acid reflux Musculoskeletal: no muscle aches/no joint aches Skin: no rashes, no hair loss Neurological: no tremors/+ numbness/+ tingling/no dizziness  I reviewed pt's medications, allergies, PMH, social hx, family hx, and changes were documented in the history of present illness. Otherwise, unchanged from my initial visit note.  Past Medical History:  Diagnosis Date  . Elevated LFTs   . Fatty liver    on [place].   Marland Kitchen Hyperlipemia   . Hypertension   . Uncontrolled type 2 diabetes with neuropathy [facility])    Past Surgical History:  Procedure Laterality Date  . APPENDECTOMY  1969  . CATARACT EXTRACTION     May  16109  . LUMBAR DISC SURGERY    . LUMBAR LAMINECTOMY/DECOMPRESSION MICRODISCECTOMY N/A 07/06/2015   Procedure: Thoracic ten- eleven laminectomy for spinal canal decompression;  Surgeon: Coletta Memos, MD;  Location: [facility];  Service: Orthopedics;  Laterality: N/A;   Social History   Social History  . Marital status: Divorced    Spouse name: N/A  . Number of children: 0   Occupational History  . None   Social History Main Topics  . Smoking status: Never Smoker  . Smokeless tobacco: Never Used  . Alcohol use No  . Drug use: No   Current Outpatient Medications on File Prior to Visit  Medication Sig Dispense Refill  . ACCU-CHEK SOFTCLIX LANCETS lancets Test 3 times daily. Pt uses accu-chek softclix lancing device 200 each 3  . acetaminophen (ACETAMINOPHEN 8 HOUR) 650 MG CR tablet Take 650 mg by mouth every 8 (eight) hours as needed for pain.    Marland Kitchen albuterol (PROVENTIL HFA) 108 (90 Base) MCG/ACT inhaler Inhale into the lungs.    Marland Kitchen amLODipine (NORVASC) 10 MG tablet TAKE 1 TABLET BY MOUTH EVERY MORNING 90 tablet 0  . aspirin EC 81 MG tablet Take 81 mg by mouth every evening.    . Cholecalciferol (VITAMIN D PO) Take 1 tablet by mouth daily.    . Dulaglutide (TRULICITY) 0.75 MG/0.5ML SOPN Inject 0.75 mg weekly under skin 4 pen 1  . gabapentin (NEURONTIN) 300 MG capsule TAKE 1 CAPSULE BY MOUTH THREE TIMES DAILY 270 capsule 1  . glucose blood test strip Test 3 times daily. Dx E11.9. Pt uses accu-chek aviva plus meter 200 each 3  . hydrochlorothiazide (MICROZIDE) 12.5 MG capsule TAKE 1 CAPSULE BY MOUTH DAILY 90 capsule 0  . insulin lispro (HUMALOG KWIKPEN) 100 UNIT/ML KiwkPen INJECT 12-22 UNITS UNDER THE SKIN THREE TIMES DAILY 45 mL 3  . Insulin Pen Needle (B-D ULTRAFINE III SHORT PEN) 31G X 8 MM MISC 3 (three) times daily before meals.    . Insulin Pen Needle (CAREFINE PEN NEEDLES) 32G X 4 MM MISC Use 4x a day 300 each 3  . lisinopril (PRINIVIL,ZESTRIL) 10 MG tablet TAKE 1 TABLET BY MOUTH  EVERY MORNING 90 tablet 0  . lovastatin (MEVACOR) 40 MG tablet TAKE 1 TABLET BY MOUTH AT BEDTIME 90 tablet 0  . metFORMIN (GLUCOPHAGE-XR) 500 MG 24 hr tablet Take 1 tablet (500 mg total) by mouth 2 (two) times daily with a meal. (Patient taking differently: Take 500 mg by mouth daily. ) 90 tablet 1  . polyethylene glycol (MIRALAX / GLYCOLAX) packet Take 17 g by mouth daily as needed for mild constipation. 14 each 0  . TOUJEO SOLOSTAR 300 UNIT/ML SOPN Inject 45 Units into the skin  at bedtime. 45 mL 3   No current facility-administered medications on file prior to visit.    Allergies  Allergen Reactions  . Other     PT PREFERS TO NOT HAVE ANY NARCOTIC MEDICATIONS  . Chlorhexidine Rash    Burning after using CHG wipes-used for surgery  . Oxycodone Hcl     Other reaction(s): Hallucination Marked hallucinations and palpitations following dose of  on 04/30/2015    Family History  Problem Relation Age of Onset  . Diabetes Mother   . Heart disease Mother   . Kidney disease Brother        Two brothers on ESRD  . Amblyopia Neg Hx   . Blindness Neg Hx   . Cataracts Neg Hx   . Glaucoma Neg Hx   . Macular degeneration Neg Hx   . Retinal detachment Neg Hx   . Strabismus Neg Hx   . Retinitis pigmentosa Neg Hx    Pt has FH of DM in M, MGM, PGM, M aunt, uncles.  PE: BP (!) 98/50 (BP Location: Right Arm, Patient Position: Sitting, Cuff Size: Large)   Pulse (!) 59   Temp (!) 97 F (36.1 C) (Oral)   SpO2 98% ; Wt Readings from Last 3 Encounters:  06/10/17 236 lb (107 kg)  04/14/17 242 lb 12.8 oz (110.1 kg)  06/30/16 236 lb (107 kg)   Constitutional: overweight, in NAD, in wheelchair Eyes: PERRLA, EOMI, no exophthalmos ENT: moist mucous membranes, no thyromegaly, no cervical lymphadenopathy Cardiovascular: RRR, No MRG Respiratory: CTA B Gastrointestinal: abdomen soft, NT, ND, BS+ Musculoskeletal: no deformities, strength intact in all 4 Skin: moist, warm, no rashes Neurological:  no tremor with outstretched hands, DTR normal in all 4  ASSESSMENT: 1. DM2, insulin-dependent, uncontrolled, with complications - CKD stage 3 - PN - DR  2. Low B12  3. HL  PLAN:  1. Patient with long-standing, uncontrolled, type 2 diabetes, on oral antidiabetic regimen, basal-bolus insulin regimen, and I recommended to start GLP-1 receptor agonist added at last visit.  She did not start this yet as she was not called from the pharmacy with a prescription.  She is also not checking sugars.  At this visit, we discussed about starting the GLP-1 receptor agonist and I again explained how this works and explained possible side effects.  I will call it in again to her pharmacy.  We will continue the rest of the regimen, since I do not have sugars to go by.  Encouraged her to switch to a more plant-based diet, which she is trying to do. - today, HbA1c is 9% (stable, high) - Of note, we tried to prescribe the Freestyle libre CGM for her but this was not covered by the insurance - I suggested to:  Patient Instructions  Please continue: - Metformin ER 500 mg 2x a day with meals. - Toujeo 45 units at night - Humalog: - 12-14 units for a smaller meal - 18 units for a regular meal - 20-22 units for a larger meal or if you have dessert Take Humalog 10-15 min before a meal.  Please start Trulicity 0.75 mg weekly. Let me know when you are close to running out to call in the higher dose to your pharmacy (1.5 mg).  Please increase B12 to 5000 mcg daily.  Please return in 3 months with your sugar log.   - continue checking sugars at different times of the day - check 3x a day, rotating checks - advised for yearly  eye exams >> she is UTD - Return to clinic in 3 mo with sugar log   2. Low B12 - latest B12 level still on the low side - at last visit, I rec'd to increase B12 from 1000 to 5000 mcg daily >> did not do this... >> will start this now - will recheck level again at next visit  3. HL -  Reviewed latest lipid panel from 09/2016: all fractions at goal - Continues the statin without side effects.   Carlus Pavlov, MD PhD [facility]

## 2017-07-11 NOTE — Patient Instructions (Addendum)
Please continue: - Metformin ER 500 mg 2x a day with meals. - Toujeo 45 units at night - Humalog: - 12-14 units for a smaller meal - 18 units for a regular meal - 20-22 units for a larger meal or if you have dessert Take Humalog 10-15 min before a meal.  Please start Trulicity 0.75 mg weekly. Let me know when you are close to running out to call in the higher dose to your pharmacy (1.5 mg).  Please increase B12 to 5000 mcg daily.  Please return in 3 months with your sugar log.

## 2017-07-16 ENCOUNTER — Telehealth: Payer: Self-pay

## 2017-07-16 DIAGNOSIS — M79604 Pain in right leg: Secondary | ICD-10-CM | POA: Diagnosis not present

## 2017-07-16 DIAGNOSIS — M4714 Other spondylosis with myelopathy, thoracic region: Secondary | ICD-10-CM | POA: Diagnosis not present

## 2017-07-16 DIAGNOSIS — M50322 Other cervical disc degeneration at C5-C6 level: Secondary | ICD-10-CM | POA: Diagnosis not present

## 2017-07-16 DIAGNOSIS — Z7982 Long term (current) use of aspirin: Secondary | ICD-10-CM | POA: Diagnosis not present

## 2017-07-16 DIAGNOSIS — Z794 Long term (current) use of insulin: Secondary | ICD-10-CM | POA: Diagnosis not present

## 2017-07-16 DIAGNOSIS — G5600 Carpal tunnel syndrome, unspecified upper limb: Secondary | ICD-10-CM | POA: Diagnosis not present

## 2017-07-16 DIAGNOSIS — M50323 Other cervical disc degeneration at C6-C7 level: Secondary | ICD-10-CM | POA: Diagnosis not present

## 2017-07-16 DIAGNOSIS — G8220 Paraplegia, unspecified: Secondary | ICD-10-CM | POA: Diagnosis not present

## 2017-07-16 DIAGNOSIS — E1140 Type 2 diabetes mellitus with diabetic neuropathy, unspecified: Secondary | ICD-10-CM | POA: Diagnosis not present

## 2017-07-16 DIAGNOSIS — G8929 Other chronic pain: Secondary | ICD-10-CM | POA: Diagnosis not present

## 2017-07-16 DIAGNOSIS — E785 Hyperlipidemia, unspecified: Secondary | ICD-10-CM | POA: Diagnosis not present

## 2017-07-16 DIAGNOSIS — M2578 Osteophyte, vertebrae: Secondary | ICD-10-CM | POA: Diagnosis not present

## 2017-07-16 DIAGNOSIS — Z981 Arthrodesis status: Secondary | ICD-10-CM | POA: Diagnosis not present

## 2017-07-16 DIAGNOSIS — M4807 Spinal stenosis, lumbosacral region: Secondary | ICD-10-CM | POA: Diagnosis not present

## 2017-07-16 DIAGNOSIS — R600 Localized edema: Secondary | ICD-10-CM | POA: Diagnosis not present

## 2017-07-16 DIAGNOSIS — M25552 Pain in left hip: Secondary | ICD-10-CM | POA: Diagnosis not present

## 2017-07-16 DIAGNOSIS — M79605 Pain in left leg: Secondary | ICD-10-CM | POA: Diagnosis not present

## 2017-07-16 DIAGNOSIS — M791 Myalgia, unspecified site: Secondary | ICD-10-CM | POA: Diagnosis not present

## 2017-07-16 DIAGNOSIS — M48061 Spinal stenosis, lumbar region without neurogenic claudication: Secondary | ICD-10-CM | POA: Diagnosis not present

## 2017-07-16 DIAGNOSIS — M5136 Other intervertebral disc degeneration, lumbar region: Secondary | ICD-10-CM | POA: Diagnosis not present

## 2017-07-16 DIAGNOSIS — M4804 Spinal stenosis, thoracic region: Secondary | ICD-10-CM | POA: Diagnosis not present

## 2017-07-16 DIAGNOSIS — I10 Essential (primary) hypertension: Secondary | ICD-10-CM | POA: Diagnosis not present

## 2017-07-16 NOTE — Telephone Encounter (Signed)
Trey Paula from [facility] needs verbal orders for PT 2wk2,1wk2.

## 2017-07-17 ENCOUNTER — Telehealth: Payer: Self-pay | Admitting: *Deleted

## 2017-07-17 NOTE — Telephone Encounter (Signed)
Mailed to patient

## 2017-07-17 NOTE — Telephone Encounter (Signed)
Notified. 

## 2017-07-17 NOTE — Telephone Encounter (Signed)
Ok for HHPT 

## 2017-07-17 NOTE — Telephone Encounter (Signed)
Pt may have temporary tag 6 mo, we can address permanent at next visit

## 2017-07-17 NOTE — Telephone Encounter (Signed)
Mrs Maxham called and said that her therapist has requested she have a hospital bed.  He has contacted [facility] and they will be faxing orders to be signed  She is also needing an handicapp placard and would like it mailed to her home.  Is this ok?

## 2017-07-22 DIAGNOSIS — M48061 Spinal stenosis, lumbar region without neurogenic claudication: Secondary | ICD-10-CM | POA: Diagnosis not present

## 2017-07-22 DIAGNOSIS — Z794 Long term (current) use of insulin: Secondary | ICD-10-CM | POA: Diagnosis not present

## 2017-07-22 DIAGNOSIS — M2578 Osteophyte, vertebrae: Secondary | ICD-10-CM | POA: Diagnosis not present

## 2017-07-22 DIAGNOSIS — M50323 Other cervical disc degeneration at C6-C7 level: Secondary | ICD-10-CM | POA: Diagnosis not present

## 2017-07-22 DIAGNOSIS — G8929 Other chronic pain: Secondary | ICD-10-CM | POA: Diagnosis not present

## 2017-07-22 DIAGNOSIS — M4714 Other spondylosis with myelopathy, thoracic region: Secondary | ICD-10-CM | POA: Diagnosis not present

## 2017-07-22 DIAGNOSIS — R600 Localized edema: Secondary | ICD-10-CM | POA: Diagnosis not present

## 2017-07-22 DIAGNOSIS — G8220 Paraplegia, unspecified: Secondary | ICD-10-CM | POA: Diagnosis not present

## 2017-07-22 DIAGNOSIS — E1140 Type 2 diabetes mellitus with diabetic neuropathy, unspecified: Secondary | ICD-10-CM | POA: Diagnosis not present

## 2017-07-22 DIAGNOSIS — M79604 Pain in right leg: Secondary | ICD-10-CM | POA: Diagnosis not present

## 2017-07-22 DIAGNOSIS — Z7982 Long term (current) use of aspirin: Secondary | ICD-10-CM | POA: Diagnosis not present

## 2017-07-22 DIAGNOSIS — I10 Essential (primary) hypertension: Secondary | ICD-10-CM | POA: Diagnosis not present

## 2017-07-22 DIAGNOSIS — M25552 Pain in left hip: Secondary | ICD-10-CM | POA: Diagnosis not present

## 2017-07-22 DIAGNOSIS — M79605 Pain in left leg: Secondary | ICD-10-CM | POA: Diagnosis not present

## 2017-07-22 DIAGNOSIS — M4804 Spinal stenosis, thoracic region: Secondary | ICD-10-CM | POA: Diagnosis not present

## 2017-07-22 DIAGNOSIS — M4807 Spinal stenosis, lumbosacral region: Secondary | ICD-10-CM | POA: Diagnosis not present

## 2017-07-22 DIAGNOSIS — Z981 Arthrodesis status: Secondary | ICD-10-CM | POA: Diagnosis not present

## 2017-07-22 DIAGNOSIS — M5136 Other intervertebral disc degeneration, lumbar region: Secondary | ICD-10-CM | POA: Diagnosis not present

## 2017-07-22 DIAGNOSIS — M50322 Other cervical disc degeneration at C5-C6 level: Secondary | ICD-10-CM | POA: Diagnosis not present

## 2017-07-22 DIAGNOSIS — M791 Myalgia, unspecified site: Secondary | ICD-10-CM | POA: Diagnosis not present

## 2017-07-22 DIAGNOSIS — G5600 Carpal tunnel syndrome, unspecified upper limb: Secondary | ICD-10-CM | POA: Diagnosis not present

## 2017-07-22 DIAGNOSIS — E785 Hyperlipidemia, unspecified: Secondary | ICD-10-CM | POA: Diagnosis not present

## 2017-07-24 DIAGNOSIS — Z7982 Long term (current) use of aspirin: Secondary | ICD-10-CM | POA: Diagnosis not present

## 2017-07-24 DIAGNOSIS — M79604 Pain in right leg: Secondary | ICD-10-CM | POA: Diagnosis not present

## 2017-07-24 DIAGNOSIS — Z794 Long term (current) use of insulin: Secondary | ICD-10-CM | POA: Diagnosis not present

## 2017-07-24 DIAGNOSIS — R600 Localized edema: Secondary | ICD-10-CM | POA: Diagnosis not present

## 2017-07-24 DIAGNOSIS — G5600 Carpal tunnel syndrome, unspecified upper limb: Secondary | ICD-10-CM | POA: Diagnosis not present

## 2017-07-24 DIAGNOSIS — E785 Hyperlipidemia, unspecified: Secondary | ICD-10-CM | POA: Diagnosis not present

## 2017-07-24 DIAGNOSIS — M25552 Pain in left hip: Secondary | ICD-10-CM | POA: Diagnosis not present

## 2017-07-24 DIAGNOSIS — M50323 Other cervical disc degeneration at C6-C7 level: Secondary | ICD-10-CM | POA: Diagnosis not present

## 2017-07-24 DIAGNOSIS — G8220 Paraplegia, unspecified: Secondary | ICD-10-CM | POA: Diagnosis not present

## 2017-07-24 DIAGNOSIS — M4714 Other spondylosis with myelopathy, thoracic region: Secondary | ICD-10-CM | POA: Diagnosis not present

## 2017-07-24 DIAGNOSIS — M4807 Spinal stenosis, lumbosacral region: Secondary | ICD-10-CM | POA: Diagnosis not present

## 2017-07-24 DIAGNOSIS — G8929 Other chronic pain: Secondary | ICD-10-CM | POA: Diagnosis not present

## 2017-07-24 DIAGNOSIS — M2578 Osteophyte, vertebrae: Secondary | ICD-10-CM | POA: Diagnosis not present

## 2017-07-24 DIAGNOSIS — Z981 Arthrodesis status: Secondary | ICD-10-CM | POA: Diagnosis not present

## 2017-07-24 DIAGNOSIS — M48061 Spinal stenosis, lumbar region without neurogenic claudication: Secondary | ICD-10-CM | POA: Diagnosis not present

## 2017-07-24 DIAGNOSIS — M4804 Spinal stenosis, thoracic region: Secondary | ICD-10-CM | POA: Diagnosis not present

## 2017-07-24 DIAGNOSIS — I10 Essential (primary) hypertension: Secondary | ICD-10-CM | POA: Diagnosis not present

## 2017-07-24 DIAGNOSIS — M5136 Other intervertebral disc degeneration, lumbar region: Secondary | ICD-10-CM | POA: Diagnosis not present

## 2017-07-24 DIAGNOSIS — M79605 Pain in left leg: Secondary | ICD-10-CM | POA: Diagnosis not present

## 2017-07-24 DIAGNOSIS — E1140 Type 2 diabetes mellitus with diabetic neuropathy, unspecified: Secondary | ICD-10-CM | POA: Diagnosis not present

## 2017-07-24 DIAGNOSIS — M791 Myalgia, unspecified site: Secondary | ICD-10-CM | POA: Diagnosis not present

## 2017-07-24 DIAGNOSIS — M50322 Other cervical disc degeneration at C5-C6 level: Secondary | ICD-10-CM | POA: Diagnosis not present

## 2017-07-29 DIAGNOSIS — M4804 Spinal stenosis, thoracic region: Secondary | ICD-10-CM | POA: Diagnosis not present

## 2017-07-29 DIAGNOSIS — E785 Hyperlipidemia, unspecified: Secondary | ICD-10-CM | POA: Diagnosis not present

## 2017-07-29 DIAGNOSIS — M4714 Other spondylosis with myelopathy, thoracic region: Secondary | ICD-10-CM | POA: Diagnosis not present

## 2017-07-29 DIAGNOSIS — M79605 Pain in left leg: Secondary | ICD-10-CM | POA: Diagnosis not present

## 2017-07-29 DIAGNOSIS — Z7982 Long term (current) use of aspirin: Secondary | ICD-10-CM | POA: Diagnosis not present

## 2017-07-29 DIAGNOSIS — G8220 Paraplegia, unspecified: Secondary | ICD-10-CM | POA: Diagnosis not present

## 2017-07-29 DIAGNOSIS — M791 Myalgia, unspecified site: Secondary | ICD-10-CM | POA: Diagnosis not present

## 2017-07-29 DIAGNOSIS — R600 Localized edema: Secondary | ICD-10-CM | POA: Diagnosis not present

## 2017-07-29 DIAGNOSIS — M50323 Other cervical disc degeneration at C6-C7 level: Secondary | ICD-10-CM | POA: Diagnosis not present

## 2017-07-29 DIAGNOSIS — Z981 Arthrodesis status: Secondary | ICD-10-CM | POA: Diagnosis not present

## 2017-07-29 DIAGNOSIS — M2578 Osteophyte, vertebrae: Secondary | ICD-10-CM | POA: Diagnosis not present

## 2017-07-29 DIAGNOSIS — M25552 Pain in left hip: Secondary | ICD-10-CM | POA: Diagnosis not present

## 2017-07-29 DIAGNOSIS — M48061 Spinal stenosis, lumbar region without neurogenic claudication: Secondary | ICD-10-CM | POA: Diagnosis not present

## 2017-07-29 DIAGNOSIS — Z794 Long term (current) use of insulin: Secondary | ICD-10-CM | POA: Diagnosis not present

## 2017-07-29 DIAGNOSIS — G8929 Other chronic pain: Secondary | ICD-10-CM | POA: Diagnosis not present

## 2017-07-29 DIAGNOSIS — G5600 Carpal tunnel syndrome, unspecified upper limb: Secondary | ICD-10-CM | POA: Diagnosis not present

## 2017-07-29 DIAGNOSIS — I10 Essential (primary) hypertension: Secondary | ICD-10-CM | POA: Diagnosis not present

## 2017-07-29 DIAGNOSIS — M4807 Spinal stenosis, lumbosacral region: Secondary | ICD-10-CM | POA: Diagnosis not present

## 2017-07-29 DIAGNOSIS — M5136 Other intervertebral disc degeneration, lumbar region: Secondary | ICD-10-CM | POA: Diagnosis not present

## 2017-07-29 DIAGNOSIS — M50322 Other cervical disc degeneration at C5-C6 level: Secondary | ICD-10-CM | POA: Diagnosis not present

## 2017-07-29 DIAGNOSIS — E1140 Type 2 diabetes mellitus with diabetic neuropathy, unspecified: Secondary | ICD-10-CM | POA: Diagnosis not present

## 2017-07-29 DIAGNOSIS — M79604 Pain in right leg: Secondary | ICD-10-CM | POA: Diagnosis not present

## 2017-07-30 DIAGNOSIS — L8995 Pressure ulcer of unspecified site, unstageable: Secondary | ICD-10-CM | POA: Diagnosis not present

## 2017-07-30 DIAGNOSIS — M4804 Spinal stenosis, thoracic region: Secondary | ICD-10-CM | POA: Diagnosis not present

## 2017-07-30 DIAGNOSIS — G992 Myelopathy in diseases classified elsewhere: Secondary | ICD-10-CM | POA: Diagnosis not present

## 2017-07-31 DIAGNOSIS — M5136 Other intervertebral disc degeneration, lumbar region: Secondary | ICD-10-CM | POA: Diagnosis not present

## 2017-07-31 DIAGNOSIS — M4804 Spinal stenosis, thoracic region: Secondary | ICD-10-CM | POA: Diagnosis not present

## 2017-07-31 DIAGNOSIS — G5600 Carpal tunnel syndrome, unspecified upper limb: Secondary | ICD-10-CM | POA: Diagnosis not present

## 2017-07-31 DIAGNOSIS — M791 Myalgia, unspecified site: Secondary | ICD-10-CM | POA: Diagnosis not present

## 2017-07-31 DIAGNOSIS — G8220 Paraplegia, unspecified: Secondary | ICD-10-CM | POA: Diagnosis not present

## 2017-07-31 DIAGNOSIS — I10 Essential (primary) hypertension: Secondary | ICD-10-CM | POA: Diagnosis not present

## 2017-07-31 DIAGNOSIS — M4714 Other spondylosis with myelopathy, thoracic region: Secondary | ICD-10-CM | POA: Diagnosis not present

## 2017-07-31 DIAGNOSIS — M25552 Pain in left hip: Secondary | ICD-10-CM | POA: Diagnosis not present

## 2017-07-31 DIAGNOSIS — Z794 Long term (current) use of insulin: Secondary | ICD-10-CM | POA: Diagnosis not present

## 2017-07-31 DIAGNOSIS — M79605 Pain in left leg: Secondary | ICD-10-CM | POA: Diagnosis not present

## 2017-07-31 DIAGNOSIS — E785 Hyperlipidemia, unspecified: Secondary | ICD-10-CM | POA: Diagnosis not present

## 2017-07-31 DIAGNOSIS — Z981 Arthrodesis status: Secondary | ICD-10-CM | POA: Diagnosis not present

## 2017-07-31 DIAGNOSIS — M48061 Spinal stenosis, lumbar region without neurogenic claudication: Secondary | ICD-10-CM | POA: Diagnosis not present

## 2017-07-31 DIAGNOSIS — Z7982 Long term (current) use of aspirin: Secondary | ICD-10-CM | POA: Diagnosis not present

## 2017-07-31 DIAGNOSIS — M4807 Spinal stenosis, lumbosacral region: Secondary | ICD-10-CM | POA: Diagnosis not present

## 2017-07-31 DIAGNOSIS — E1140 Type 2 diabetes mellitus with diabetic neuropathy, unspecified: Secondary | ICD-10-CM | POA: Diagnosis not present

## 2017-07-31 DIAGNOSIS — R600 Localized edema: Secondary | ICD-10-CM | POA: Diagnosis not present

## 2017-07-31 DIAGNOSIS — M79604 Pain in right leg: Secondary | ICD-10-CM | POA: Diagnosis not present

## 2017-07-31 DIAGNOSIS — M2578 Osteophyte, vertebrae: Secondary | ICD-10-CM | POA: Diagnosis not present

## 2017-07-31 DIAGNOSIS — G8929 Other chronic pain: Secondary | ICD-10-CM | POA: Diagnosis not present

## 2017-07-31 DIAGNOSIS — M50322 Other cervical disc degeneration at C5-C6 level: Secondary | ICD-10-CM | POA: Diagnosis not present

## 2017-07-31 DIAGNOSIS — M50323 Other cervical disc degeneration at C6-C7 level: Secondary | ICD-10-CM | POA: Diagnosis not present

## 2017-08-05 DIAGNOSIS — M791 Myalgia, unspecified site: Secondary | ICD-10-CM | POA: Diagnosis not present

## 2017-08-05 DIAGNOSIS — M79604 Pain in right leg: Secondary | ICD-10-CM | POA: Diagnosis not present

## 2017-08-05 DIAGNOSIS — G8220 Paraplegia, unspecified: Secondary | ICD-10-CM | POA: Diagnosis not present

## 2017-08-05 DIAGNOSIS — M50323 Other cervical disc degeneration at C6-C7 level: Secondary | ICD-10-CM | POA: Diagnosis not present

## 2017-08-05 DIAGNOSIS — M50322 Other cervical disc degeneration at C5-C6 level: Secondary | ICD-10-CM | POA: Diagnosis not present

## 2017-08-05 DIAGNOSIS — M4714 Other spondylosis with myelopathy, thoracic region: Secondary | ICD-10-CM | POA: Diagnosis not present

## 2017-08-05 DIAGNOSIS — M5136 Other intervertebral disc degeneration, lumbar region: Secondary | ICD-10-CM | POA: Diagnosis not present

## 2017-08-05 DIAGNOSIS — M48061 Spinal stenosis, lumbar region without neurogenic claudication: Secondary | ICD-10-CM | POA: Diagnosis not present

## 2017-08-05 DIAGNOSIS — M4804 Spinal stenosis, thoracic region: Secondary | ICD-10-CM | POA: Diagnosis not present

## 2017-08-05 DIAGNOSIS — M25552 Pain in left hip: Secondary | ICD-10-CM | POA: Diagnosis not present

## 2017-08-05 DIAGNOSIS — M79605 Pain in left leg: Secondary | ICD-10-CM | POA: Diagnosis not present

## 2017-08-05 DIAGNOSIS — R600 Localized edema: Secondary | ICD-10-CM | POA: Diagnosis not present

## 2017-08-05 DIAGNOSIS — Z7982 Long term (current) use of aspirin: Secondary | ICD-10-CM | POA: Diagnosis not present

## 2017-08-05 DIAGNOSIS — Z981 Arthrodesis status: Secondary | ICD-10-CM | POA: Diagnosis not present

## 2017-08-05 DIAGNOSIS — G5600 Carpal tunnel syndrome, unspecified upper limb: Secondary | ICD-10-CM | POA: Diagnosis not present

## 2017-08-05 DIAGNOSIS — G8929 Other chronic pain: Secondary | ICD-10-CM | POA: Diagnosis not present

## 2017-08-05 DIAGNOSIS — M2578 Osteophyte, vertebrae: Secondary | ICD-10-CM | POA: Diagnosis not present

## 2017-08-05 DIAGNOSIS — I10 Essential (primary) hypertension: Secondary | ICD-10-CM | POA: Diagnosis not present

## 2017-08-05 DIAGNOSIS — E1140 Type 2 diabetes mellitus with diabetic neuropathy, unspecified: Secondary | ICD-10-CM | POA: Diagnosis not present

## 2017-08-05 DIAGNOSIS — M4807 Spinal stenosis, lumbosacral region: Secondary | ICD-10-CM | POA: Diagnosis not present

## 2017-08-05 DIAGNOSIS — Z794 Long term (current) use of insulin: Secondary | ICD-10-CM | POA: Diagnosis not present

## 2017-08-05 DIAGNOSIS — E785 Hyperlipidemia, unspecified: Secondary | ICD-10-CM | POA: Diagnosis not present

## 2017-08-13 DIAGNOSIS — E785 Hyperlipidemia, unspecified: Secondary | ICD-10-CM | POA: Diagnosis not present

## 2017-08-13 DIAGNOSIS — M50322 Other cervical disc degeneration at C5-C6 level: Secondary | ICD-10-CM | POA: Diagnosis not present

## 2017-08-13 DIAGNOSIS — M791 Myalgia, unspecified site: Secondary | ICD-10-CM | POA: Diagnosis not present

## 2017-08-13 DIAGNOSIS — M79605 Pain in left leg: Secondary | ICD-10-CM | POA: Diagnosis not present

## 2017-08-13 DIAGNOSIS — M79604 Pain in right leg: Secondary | ICD-10-CM | POA: Diagnosis not present

## 2017-08-13 DIAGNOSIS — M48061 Spinal stenosis, lumbar region without neurogenic claudication: Secondary | ICD-10-CM | POA: Diagnosis not present

## 2017-08-13 DIAGNOSIS — M4714 Other spondylosis with myelopathy, thoracic region: Secondary | ICD-10-CM | POA: Diagnosis not present

## 2017-08-13 DIAGNOSIS — M50323 Other cervical disc degeneration at C6-C7 level: Secondary | ICD-10-CM | POA: Diagnosis not present

## 2017-08-13 DIAGNOSIS — G5600 Carpal tunnel syndrome, unspecified upper limb: Secondary | ICD-10-CM | POA: Diagnosis not present

## 2017-08-13 DIAGNOSIS — G8929 Other chronic pain: Secondary | ICD-10-CM | POA: Diagnosis not present

## 2017-08-13 DIAGNOSIS — R600 Localized edema: Secondary | ICD-10-CM | POA: Diagnosis not present

## 2017-08-13 DIAGNOSIS — M5136 Other intervertebral disc degeneration, lumbar region: Secondary | ICD-10-CM | POA: Diagnosis not present

## 2017-08-13 DIAGNOSIS — Z981 Arthrodesis status: Secondary | ICD-10-CM | POA: Diagnosis not present

## 2017-08-13 DIAGNOSIS — I10 Essential (primary) hypertension: Secondary | ICD-10-CM | POA: Diagnosis not present

## 2017-08-13 DIAGNOSIS — Z794 Long term (current) use of insulin: Secondary | ICD-10-CM | POA: Diagnosis not present

## 2017-08-13 DIAGNOSIS — M25552 Pain in left hip: Secondary | ICD-10-CM | POA: Diagnosis not present

## 2017-08-13 DIAGNOSIS — G8220 Paraplegia, unspecified: Secondary | ICD-10-CM | POA: Diagnosis not present

## 2017-08-13 DIAGNOSIS — Z7982 Long term (current) use of aspirin: Secondary | ICD-10-CM | POA: Diagnosis not present

## 2017-08-13 DIAGNOSIS — M4807 Spinal stenosis, lumbosacral region: Secondary | ICD-10-CM | POA: Diagnosis not present

## 2017-08-13 DIAGNOSIS — M2578 Osteophyte, vertebrae: Secondary | ICD-10-CM | POA: Diagnosis not present

## 2017-08-13 DIAGNOSIS — E1140 Type 2 diabetes mellitus with diabetic neuropathy, unspecified: Secondary | ICD-10-CM | POA: Diagnosis not present

## 2017-08-13 DIAGNOSIS — M4804 Spinal stenosis, thoracic region: Secondary | ICD-10-CM | POA: Diagnosis not present

## 2017-08-29 DIAGNOSIS — L8995 Pressure ulcer of unspecified site, unstageable: Secondary | ICD-10-CM | POA: Diagnosis not present

## 2017-09-09 ENCOUNTER — Encounter: Payer: Self-pay | Admitting: Physical Medicine & Rehabilitation

## 2017-09-09 ENCOUNTER — Encounter: Payer: Medicare Other | Attending: Physical Medicine & Rehabilitation

## 2017-09-09 ENCOUNTER — Ambulatory Visit (HOSPITAL_BASED_OUTPATIENT_CLINIC_OR_DEPARTMENT_OTHER): Payer: Medicare Other | Admitting: Physical Medicine & Rehabilitation

## 2017-09-09 ENCOUNTER — Other Ambulatory Visit: Payer: Self-pay

## 2017-09-09 VITALS — BP 154/83 | HR 57 | Ht 64.5 in | Wt 238.0 lb

## 2017-09-09 DIAGNOSIS — G8220 Paraplegia, unspecified: Secondary | ICD-10-CM | POA: Diagnosis not present

## 2017-09-09 DIAGNOSIS — M4804 Spinal stenosis, thoracic region: Secondary | ICD-10-CM | POA: Insufficient documentation

## 2017-09-09 DIAGNOSIS — M4714 Other spondylosis with myelopathy, thoracic region: Secondary | ICD-10-CM | POA: Insufficient documentation

## 2017-09-09 DIAGNOSIS — M50322 Other cervical disc degeneration at C5-C6 level: Secondary | ICD-10-CM | POA: Diagnosis not present

## 2017-09-09 DIAGNOSIS — M5136 Other intervertebral disc degeneration, lumbar region: Secondary | ICD-10-CM | POA: Insufficient documentation

## 2017-09-09 DIAGNOSIS — M75102 Unspecified rotator cuff tear or rupture of left shoulder, not specified as traumatic: Secondary | ICD-10-CM

## 2017-09-09 NOTE — Progress Notes (Signed)
Subjective:    Patient ID: Cassandra StalkerBarbara Stewart, female    DOB: 02/19/1951, 67 y.o.   MRN: [id number] 3 year hx of progressive BLE pain, which was first noted when going up and down stairs [facility] a few days and [facility].  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   Pain Inventory Average Pain 6 Pain Right Now 6 My pain is constant and throbbing  In the last 24 hours, has pain interfered with the following? General activity 6 Relation with others 2 Enjoyment of life 9 What TIME of day is your pain at its worst? all Sleep (in general) Fair  Pain is worse with: unsure Pain improves with: heat/ice Relief from Meds: 5  Mobility use a walker how many minutes can you walk? 3 ability to climb steps?  no do you drive?  no use a wheelchair  Function retired I need assistance with the following:  shopping  Neuro/Psych numbness tingling  Prior Studies Any changes since last visit?  no  Physicians involved in your care Any changes since last visit?  no   Family History  Problem Relation Age of Onset  . Diabetes Mother   . Heart disease Mother   . Kidney disease Brother        Two brothers on ESRD  .  Amblyopia Neg Hx   . Blindness Neg Hx   . Cataracts Neg Hx   . Glaucoma Neg Hx   . Macular degeneration Neg Hx   . Retinal detachment Neg Hx   . Strabismus Neg Hx   . Retinitis pigmentosa Neg Hx    Social History   Socioeconomic History  . Marital status: Divorced    Spouse name: Not on file  . Number of children: Not on file  . Years of education: Not on file  . Highest education level: Not on file  Occupational History  . Not on file  Social Needs  . Financial resource strain: Not on file  . Food insecurity:    Worry: Not on file    Inability: Not on file  . Transportation needs:    Medical: Not on file    Non-medical: Not on file  Tobacco Use  . Smoking status: Never Smoker  . Smokeless tobacco: Never Used  Substance and Sexual Activity  . Alcohol use: No  . Drug use: No  . Sexual activity: Not Currently  Lifestyle  . Physical activity:    Days per week: Not on file    Minutes per session: Not on file  . Stress: Not on file  Relationships  . Social connections:    Talks on phone: Not on file    Gets together: Not on file    Attends religious service: Not  on file    Active member of club or organization: Not on file    Attends meetings of clubs or organizations: Not on file    Relationship status: Not on file  Other Topics Concern  . Not on file  Social History Narrative  . Not on file   Past Surgical History:  Procedure Laterality Date  . APPENDECTOMY  1969  . CATARACT EXTRACTION     May 16109  . LUMBAR DISC SURGERY    . LUMBAR LAMINECTOMY/DECOMPRESSION MICRODISCECTOMY N/A 07/06/2015   Procedure: Thoracic ten- eleven laminectomy for spinal canal decompression;  Surgeon: Coletta Memos, MD;  Location: [facility];  Service: Orthopedics;  Laterality: N/A;   Past Medical History:  Diagnosis Date  . Elevated LFTs   . Fatty liver    on [place].   Marland Kitchen Hyperlipemia   . Hypertension   . Uncontrolled type 2 diabetes with neuropathy (HCC)    BP (!) 154/83    Pulse (!) 57   Ht 5' 4.5" (1.638 m)   Wt 238 lb (108 kg)   SpO2 97%   BMI 40.22 kg/m   Opioid Risk Score:   Fall Risk Score:  `1  Depression screen PHQ 2/9  Depression screen [facility] 2/9 09/09/2017 08/05/2016 08/01/2016 07/29/2016 10/25/2015  Decreased Interest 0 0 0 0 0  Down, Depressed, Hopeless 0 0 0 0 0  PHQ - 2 Score 0 0 0 0 0    Review of Systems  Constitutional: Negative.   HENT: Negative.   Eyes: Negative.   Respiratory: Negative.   Cardiovascular:       Limb swelling  Gastrointestinal: Negative.   Endocrine: Negative.   Genitourinary: Negative.   Musculoskeletal: Negative.   Skin: Negative.   Allergic/Immunologic: Negative.   Neurological: Negative.   Hematological: Negative.   Psychiatric/Behavioral: Negative.   All other systems reviewed and are negative.      Objective:   Physical Exam  Left lower extremities 3- at the knee extensor and ankle dorsiflexor 2- at the hip flexor Right lower extremity 4- at the hip flexor knee extensor ankle dorsiflexor. 5/5 bilateral deltoid bicep tricep grip Positive impingement sign left shoulder at 90 degrees. She is able to internally externally rotate both shoulders without limited range of motion. Sensation intact to light touch and pinprick bilateral upper limbs.  Negative Tinel's negative Phalen's Wheelchair mobility is good. Pain in the left shoulder during pressure relief     Assessment & Plan:  1.  Thoracic myelopathy with paraplegia we discussed that her functional status is stable at this point.  Do not expect patient will achieve independent ambulation.  2.  Left shoulder pain likely impingement syndrome we discussed treatment options including physical therapy which I have ordered, return in 1 month if no better we will do subacromial injection.  We discussed that because of her paraparesis she is putting increased weight through her shoulder joints and is likely to be prone to shoulder injuries. I have ordered her  to lift chair to help with transfers from recliner.

## 2017-09-10 ENCOUNTER — Encounter: Payer: Self-pay | Admitting: *Deleted

## 2017-09-12 ENCOUNTER — Telehealth: Payer: Self-pay | Admitting: Internal Medicine

## 2017-09-12 MED ORDER — DULAGLUTIDE 0.75 MG/0.5ML ~~LOC~~ SOAJ: SUBCUTANEOUS | 1 refills | Status: DC

## 2017-09-12 MED ORDER — TOUJEO SOLOSTAR 300 UNIT/ML ~~LOC~~ SOPN: 45.0000 [IU] | PEN_INJECTOR | Freq: Every day | SUBCUTANEOUS | 3 refills | Status: DC

## 2017-09-12 NOTE — Telephone Encounter (Signed)
7/19 AM 79 7/18 PM 93 Afternoon 101 AM 73 7/17 PM 69 Afternoon 87 After Breakfast 85 AM 125 7/16 PM 114 Afternoon 99 After Breakfast 78 AM 93  She would like to know if she needs to make adjustments or continue her Trulicity

## 2017-09-12 NOTE — Telephone Encounter (Signed)
LMTCB and give blood sugars

## 2017-09-12 NOTE — Telephone Encounter (Signed)
Patient wants to know if she should continue with the Trulicity. If so, she needs a RX for [facility] to [facility] [address] asap. Patient is completely out of Trulicity. Also, Patient has her chart for blood sugars. Please call patient at ph# [phone] to let her know how she will be proceeding re: Trulicity and for blood sugar chart information.

## 2017-09-12 NOTE — Telephone Encounter (Signed)
Medication sent and Pt is aware  

## 2017-09-12 NOTE — Telephone Encounter (Signed)
Great numbers! Continue Trulicity and Metformin but decrease insulin: - Toujeo 45 >> 38 units at night - Humalog:  - 12-14 units for a smaller meal >> 8 - 18 units for a regular meal >> 10 - 20-22 units for a larger meal or if you have dessert >> 12

## 2017-09-17 ENCOUNTER — Other Ambulatory Visit: Payer: Self-pay | Admitting: Family Medicine

## 2017-09-18 DIAGNOSIS — R269 Unspecified abnormalities of gait and mobility: Secondary | ICD-10-CM | POA: Diagnosis not present

## 2017-09-18 DIAGNOSIS — M75102 Unspecified rotator cuff tear or rupture of left shoulder, not specified as traumatic: Secondary | ICD-10-CM | POA: Diagnosis not present

## 2017-09-18 DIAGNOSIS — I10 Essential (primary) hypertension: Secondary | ICD-10-CM | POA: Diagnosis not present

## 2017-09-18 DIAGNOSIS — M545 Low back pain: Secondary | ICD-10-CM | POA: Diagnosis not present

## 2017-09-18 DIAGNOSIS — M25512 Pain in left shoulder: Secondary | ICD-10-CM | POA: Diagnosis not present

## 2017-09-18 DIAGNOSIS — E119 Type 2 diabetes mellitus without complications: Secondary | ICD-10-CM | POA: Diagnosis not present

## 2017-09-25 DIAGNOSIS — M75102 Unspecified rotator cuff tear or rupture of left shoulder, not specified as traumatic: Secondary | ICD-10-CM | POA: Diagnosis not present

## 2017-09-25 DIAGNOSIS — M545 Low back pain: Secondary | ICD-10-CM | POA: Diagnosis not present

## 2017-09-25 DIAGNOSIS — M25512 Pain in left shoulder: Secondary | ICD-10-CM | POA: Diagnosis not present

## 2017-09-25 DIAGNOSIS — R269 Unspecified abnormalities of gait and mobility: Secondary | ICD-10-CM | POA: Diagnosis not present

## 2017-09-25 DIAGNOSIS — I10 Essential (primary) hypertension: Secondary | ICD-10-CM | POA: Diagnosis not present

## 2017-09-25 DIAGNOSIS — E119 Type 2 diabetes mellitus without complications: Secondary | ICD-10-CM | POA: Diagnosis not present

## 2017-09-26 ENCOUNTER — Other Ambulatory Visit: Payer: Self-pay | Admitting: Family Medicine

## 2017-09-29 ENCOUNTER — Telehealth: Payer: Self-pay | Admitting: Physical Medicine & Rehabilitation

## 2017-09-29 ENCOUNTER — Telehealth: Payer: Self-pay | Admitting: *Deleted

## 2017-09-29 DIAGNOSIS — L8995 Pressure ulcer of unspecified site, unstageable: Secondary | ICD-10-CM | POA: Diagnosis not present

## 2017-09-29 NOTE — Telephone Encounter (Signed)
I have let Ms Cassandra Stewart that we are addressing this and that Cassandra Stewart will reach out to her when she knows more.

## 2017-09-29 NOTE — Telephone Encounter (Signed)
Patient may need a wheelchair evaluation can schedule with Riley LamEunice

## 2017-09-29 NOTE — Telephone Encounter (Signed)
[facility]. I have reached out to MacDonnell Heightshristy Sprinkle 09/29/17 to check status on this update.  Thanks, Rosezella FloridaLisa M.

## 2017-09-29 NOTE — Telephone Encounter (Signed)
[facility] 09/18[facility]. They are looking into what DME company with cover this chair thru her ins. Cassandra Stewart is the care coordinator & will get back to me with that info.  [facility] Cassandra Stewart [phone]  Thanks, Cassandra FloridaLisa M.

## 2017-09-29 NOTE — Telephone Encounter (Signed)
Ms Cassandra Stewart called to check on the medical equipment that Dr Wynn BankerKirsteins talked with her about at her lat visit.  By looking at the note it looks like it says, "I have ordered her to lift chair to help with transfers from recliner."  I am sending the message to Sigmund HazelLisa Miller to check about this item.

## 2017-09-30 DIAGNOSIS — M25512 Pain in left shoulder: Secondary | ICD-10-CM

## 2017-09-30 DIAGNOSIS — R269 Unspecified abnormalities of gait and mobility: Secondary | ICD-10-CM

## 2017-09-30 DIAGNOSIS — I10 Essential (primary) hypertension: Secondary | ICD-10-CM

## 2017-09-30 DIAGNOSIS — M75102 Unspecified rotator cuff tear or rupture of left shoulder, not specified as traumatic: Secondary | ICD-10-CM

## 2017-09-30 DIAGNOSIS — M545 Low back pain: Secondary | ICD-10-CM

## 2017-09-30 DIAGNOSIS — E119 Type 2 diabetes mellitus without complications: Secondary | ICD-10-CM

## 2017-10-02 DIAGNOSIS — E119 Type 2 diabetes mellitus without complications: Secondary | ICD-10-CM | POA: Diagnosis not present

## 2017-10-02 DIAGNOSIS — M545 Low back pain: Secondary | ICD-10-CM | POA: Diagnosis not present

## 2017-10-02 DIAGNOSIS — M75102 Unspecified rotator cuff tear or rupture of left shoulder, not specified as traumatic: Secondary | ICD-10-CM | POA: Diagnosis not present

## 2017-10-02 DIAGNOSIS — R269 Unspecified abnormalities of gait and mobility: Secondary | ICD-10-CM | POA: Diagnosis not present

## 2017-10-02 DIAGNOSIS — I10 Essential (primary) hypertension: Secondary | ICD-10-CM | POA: Diagnosis not present

## 2017-10-02 DIAGNOSIS — M25512 Pain in left shoulder: Secondary | ICD-10-CM | POA: Diagnosis not present

## 2017-10-07 DIAGNOSIS — I10 Essential (primary) hypertension: Secondary | ICD-10-CM | POA: Diagnosis not present

## 2017-10-07 DIAGNOSIS — M25512 Pain in left shoulder: Secondary | ICD-10-CM | POA: Diagnosis not present

## 2017-10-07 DIAGNOSIS — R269 Unspecified abnormalities of gait and mobility: Secondary | ICD-10-CM | POA: Diagnosis not present

## 2017-10-07 DIAGNOSIS — E119 Type 2 diabetes mellitus without complications: Secondary | ICD-10-CM | POA: Diagnosis not present

## 2017-10-07 DIAGNOSIS — M545 Low back pain: Secondary | ICD-10-CM | POA: Diagnosis not present

## 2017-10-07 DIAGNOSIS — M75102 Unspecified rotator cuff tear or rupture of left shoulder, not specified as traumatic: Secondary | ICD-10-CM | POA: Diagnosis not present

## 2017-10-09 DIAGNOSIS — M545 Low back pain: Secondary | ICD-10-CM | POA: Diagnosis not present

## 2017-10-09 DIAGNOSIS — E119 Type 2 diabetes mellitus without complications: Secondary | ICD-10-CM | POA: Diagnosis not present

## 2017-10-09 DIAGNOSIS — I10 Essential (primary) hypertension: Secondary | ICD-10-CM | POA: Diagnosis not present

## 2017-10-09 DIAGNOSIS — R269 Unspecified abnormalities of gait and mobility: Secondary | ICD-10-CM | POA: Diagnosis not present

## 2017-10-09 DIAGNOSIS — M25512 Pain in left shoulder: Secondary | ICD-10-CM | POA: Diagnosis not present

## 2017-10-09 DIAGNOSIS — M75102 Unspecified rotator cuff tear or rupture of left shoulder, not specified as traumatic: Secondary | ICD-10-CM | POA: Diagnosis not present

## 2017-10-09 NOTE — Telephone Encounter (Signed)
Patient called again.  She is frustrated that no one has contacted her about the DME's that Dr. Wynn BankerKirsteins had ordered for her.  She is feeling neglected.  Please follow up

## 2017-10-14 DIAGNOSIS — I10 Essential (primary) hypertension: Secondary | ICD-10-CM | POA: Diagnosis not present

## 2017-10-14 DIAGNOSIS — M75102 Unspecified rotator cuff tear or rupture of left shoulder, not specified as traumatic: Secondary | ICD-10-CM | POA: Diagnosis not present

## 2017-10-14 DIAGNOSIS — M25512 Pain in left shoulder: Secondary | ICD-10-CM | POA: Diagnosis not present

## 2017-10-14 DIAGNOSIS — R269 Unspecified abnormalities of gait and mobility: Secondary | ICD-10-CM | POA: Diagnosis not present

## 2017-10-14 DIAGNOSIS — E119 Type 2 diabetes mellitus without complications: Secondary | ICD-10-CM | POA: Diagnosis not present

## 2017-10-14 DIAGNOSIS — M545 Low back pain: Secondary | ICD-10-CM | POA: Diagnosis not present

## 2017-10-16 ENCOUNTER — Other Ambulatory Visit: Payer: Self-pay | Admitting: Internal Medicine

## 2017-10-16 DIAGNOSIS — M75102 Unspecified rotator cuff tear or rupture of left shoulder, not specified as traumatic: Secondary | ICD-10-CM | POA: Diagnosis not present

## 2017-10-16 DIAGNOSIS — R269 Unspecified abnormalities of gait and mobility: Secondary | ICD-10-CM | POA: Diagnosis not present

## 2017-10-16 DIAGNOSIS — M545 Low back pain: Secondary | ICD-10-CM | POA: Diagnosis not present

## 2017-10-16 DIAGNOSIS — M25512 Pain in left shoulder: Secondary | ICD-10-CM | POA: Diagnosis not present

## 2017-10-16 DIAGNOSIS — I10 Essential (primary) hypertension: Secondary | ICD-10-CM | POA: Diagnosis not present

## 2017-10-16 DIAGNOSIS — E119 Type 2 diabetes mellitus without complications: Secondary | ICD-10-CM | POA: Diagnosis not present

## 2017-10-17 ENCOUNTER — Telehealth: Payer: Self-pay | Admitting: Internal Medicine

## 2017-10-17 NOTE — Telephone Encounter (Signed)
Sent today

## 2017-10-17 NOTE — Telephone Encounter (Signed)
Patient called re: She is out of Trulicity. The pharmacy[facility] on [address] suggested she get a 90 day RX for Trulicity. Pharmacy has been trying to get approval for the 90 day supply for 2 days. Patient requests a 90 day supply RX for Trulicity be sent to the pharmacy listed herein asap.

## 2017-10-20 DIAGNOSIS — E119 Type 2 diabetes mellitus without complications: Secondary | ICD-10-CM | POA: Diagnosis not present

## 2017-10-20 DIAGNOSIS — M25512 Pain in left shoulder: Secondary | ICD-10-CM | POA: Diagnosis not present

## 2017-10-20 DIAGNOSIS — I10 Essential (primary) hypertension: Secondary | ICD-10-CM | POA: Diagnosis not present

## 2017-10-20 DIAGNOSIS — M545 Low back pain: Secondary | ICD-10-CM | POA: Diagnosis not present

## 2017-10-20 DIAGNOSIS — R269 Unspecified abnormalities of gait and mobility: Secondary | ICD-10-CM | POA: Diagnosis not present

## 2017-10-20 DIAGNOSIS — M75102 Unspecified rotator cuff tear or rupture of left shoulder, not specified as traumatic: Secondary | ICD-10-CM | POA: Diagnosis not present

## 2017-10-21 ENCOUNTER — Other Ambulatory Visit: Payer: Self-pay | Admitting: Family Medicine

## 2017-10-21 ENCOUNTER — Ambulatory Visit: Payer: Medicare Other | Admitting: Family Medicine

## 2017-10-21 ENCOUNTER — Other Ambulatory Visit: Payer: Self-pay

## 2017-10-21 ENCOUNTER — Encounter: Payer: Self-pay | Admitting: Physical Medicine & Rehabilitation

## 2017-10-21 ENCOUNTER — Ambulatory Visit: Payer: Medicare Other | Admitting: Physical Medicine & Rehabilitation

## 2017-10-21 ENCOUNTER — Encounter: Payer: Medicare Other | Attending: Physical Medicine & Rehabilitation

## 2017-10-21 VITALS — BP 135/77 | HR 61 | Ht 64.5 in | Wt 237.4 lb

## 2017-10-21 DIAGNOSIS — G8220 Paraplegia, unspecified: Secondary | ICD-10-CM

## 2017-10-21 DIAGNOSIS — M4714 Other spondylosis with myelopathy, thoracic region: Secondary | ICD-10-CM | POA: Diagnosis not present

## 2017-10-21 DIAGNOSIS — M4804 Spinal stenosis, thoracic region: Secondary | ICD-10-CM | POA: Diagnosis not present

## 2017-10-21 DIAGNOSIS — M5136 Other intervertebral disc degeneration, lumbar region: Secondary | ICD-10-CM | POA: Insufficient documentation

## 2017-10-21 DIAGNOSIS — M50322 Other cervical disc degeneration at C5-C6 level: Secondary | ICD-10-CM | POA: Insufficient documentation

## 2017-10-21 NOTE — Telephone Encounter (Signed)
Pt has an appt in September.

## 2017-10-21 NOTE — Telephone Encounter (Signed)
Pt called requesting refill on Amlodipine 10 mg

## 2017-10-21 NOTE — Progress Notes (Signed)
Subjective:    Patient ID: Cassandra Stewart, female    DOB: [birth year], 67 y.o.   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 At last visit on 09/09/2017 she was complaining of left shoulder pain.  She had moved to box overhead and felt a strain. She has been receiving home health PT and her pain has diminished substantially. She was originally scheduled to do a sub-acromial injection today but her pain complaints have subsided Painfree ROM improved after therapy Still    Pain Inventory receiving HHPT through [facility]  Having pain in WC manual, mainly in buttocks Uses memory foam cushion  Takes Tylenol twice a day with goal of once a day, takes for arthritis pain  Gabapentin 300mg  BID Average Pain 4 Pain Right Now 4 My pain is intermittent and throbbing  In the last 24 hours, has pain interfered with the following? General activity 6 Relation with others 6 Enjoyment of life 9 What TIME of day is your pain at its worst? daytime Sleep (in general) NA  Pain is worse with: na Pain improves with: heat/ice and therapy/exercise Relief from Meds: 7  Mobility use a walker ability to climb steps?  no do you drive?  no use a wheelchair  Function Do you have any goals in this area?  no  Neuro/Psych weakness numbness  Prior Studies Any changes since last visit?  no  Physicians involved in your care Any changes since last visit?  no   Family History  Problem Relation Age of Onset  . Diabetes Mother   . Heart disease Mother   . Kidney disease Brother        Two brothers on ESRD  . Amblyopia Neg Hx   . Blindness Neg Hx   . Cataracts Neg Hx   . Glaucoma Neg Hx   . Macular degeneration Neg Hx   . Retinal detachment Neg Hx   . Strabismus Neg Hx   . Retinitis pigmentosa Neg Hx    Social History   Socioeconomic History  . Marital status:  Divorced    Spouse name: Not on file  . Number of children: Not on file  . Years of education: Not on file  . Highest education level: Not on file  Occupational History  . Not on file  Social Needs  . Financial resource strain: Not on file  . Food insecurity:    Worry: Not on file    Inability: Not on file  . Transportation needs:    Medical: Not on file    Non-medical: Not on file  Tobacco Use  . Smoking status: Never Smoker  . Smokeless tobacco: Never Used  Substance and Sexual Activity  . Alcohol use: No  . Drug use: No  . Sexual activity: Not Currently  Lifestyle  . Physical activity:    Days per week: Not on file    Minutes per session: Not on file  . Stress: Not on file  Relationships  . Social connections:    Talks on phone: Not on file    Gets together: Not on file    Attends religious service: Not on file    Active member of club or organization: Not on file    Attends meetings of clubs or organizations: Not on file    Relationship status: Not on file  Other Topics Concern  . Not on file  Social History  Narrative  . Not on file   Past Surgical History:  Procedure Laterality Date  . APPENDECTOMY  1969  . CATARACT EXTRACTION     May [id number]  . LUMBAR DISC SURGERY    . LUMBAR LAMINECTOMY/DECOMPRESSION MICRODISCECTOMY N/A 07/06/2015   Procedure: Thoracic ten- eleven laminectomy for spinal canal decompression;  Surgeon: Coletta MemosKyle Cabbell, MD;  Location: [facility];  Service: Orthopedics;  Laterality: N/A;   Past Medical History:  Diagnosis Date  . Elevated LFTs   . Fatty liver    on US.   Marland Kitchen. Hyperlipemia   . Hypertension   . Uncontrolled type 2 diabetes with neuropathy (HCC)    BP 135/77   Pulse 61   Ht 5' 4.5" (1.638 m)   Wt 237 lb 6.4 oz (107.7 kg)   SpO2 96%   BMI 40.12 kg/m   Opioid Risk Score:   Fall Risk Score:  `1  Depression screen PHQ 2/9  [facility] 10/21/2017 09/09/2017 08/05/2016 08/01/2016 07/29/2016 10/25/2015  Decreased  Interest 0 0 0 0 0 0  Down, Depressed, Hopeless 0 0 0 0 0 0  PHQ - 2 Score 0 0 0 0 0 0    Review of Systems  Constitutional: Negative.   HENT: Negative.   Eyes: Negative.   Respiratory: Negative.   Cardiovascular: Positive for leg swelling.  Gastrointestinal: Negative.   Endocrine:       High blood sugars  Genitourinary: Negative.   Musculoskeletal: Negative.   Skin: Negative.   Allergic/Immunologic: Negative.   Neurological: Positive for weakness and numbness.  Hematological: Negative.   Psychiatric/Behavioral: Negative.   All other systems reviewed and are negative.      Objective:   Physical Exam  Constitutional: She appears well-developed and well-nourished. No distress.  HENT:  Head: Normocephalic and atraumatic.  Eyes: Pupils are equal, round, and reactive to light. EOM are normal.  Skin: Skin is warm and dry. She is not diaphoretic.  Psychiatric: She has a normal mood and affect.  Nursing note and vitals reviewed.    3-/5 LLE, hip flexor knee extensor ankle dorsiflexor  4/5 RLE, hip flexor knee extensor ankle dorsiflexor  Shoulders full painfree ROM both shoulders Negative impingement sign in bilateral shoulders Nonambulatory    Assessment & Plan:  1.  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  She is in a wheelchair level.  She does have some seating issues particularly with the cushion for her manual wheelchair.  She does relatively well with her motorized scooter. I do think she may benefit from an improved seating system.  2.  Left shoulder pain impingement syndrome improved after physical therapy.  No need for injection today  Physical medicine rehab follow-up in 3 months

## 2017-10-23 ENCOUNTER — Telehealth: Payer: Self-pay | Admitting: Physical Medicine & Rehabilitation

## 2017-10-23 DIAGNOSIS — M25512 Pain in left shoulder: Secondary | ICD-10-CM | POA: Diagnosis not present

## 2017-10-23 DIAGNOSIS — M545 Low back pain: Secondary | ICD-10-CM | POA: Diagnosis not present

## 2017-10-23 DIAGNOSIS — M75102 Unspecified rotator cuff tear or rupture of left shoulder, not specified as traumatic: Secondary | ICD-10-CM | POA: Diagnosis not present

## 2017-10-23 DIAGNOSIS — I10 Essential (primary) hypertension: Secondary | ICD-10-CM | POA: Diagnosis not present

## 2017-10-23 DIAGNOSIS — R269 Unspecified abnormalities of gait and mobility: Secondary | ICD-10-CM | POA: Diagnosis not present

## 2017-10-23 DIAGNOSIS — E119 Type 2 diabetes mellitus without complications: Secondary | ICD-10-CM | POA: Diagnosis not present

## 2017-10-23 NOTE — Telephone Encounter (Signed)
Patient voicemail tfer to me from clinic - patient left extremely irate voicemail that she is getting run around re DME.  Spoke to [employer]isa Miller - she states has been back and forth with [facility] regarding their misunderstanding of order.  She contacted [employer] this morning and vendor states that are working on it as we speak.  I phoned home LVM [phone] to call me back.

## 2017-10-28 DIAGNOSIS — M545 Low back pain: Secondary | ICD-10-CM | POA: Diagnosis not present

## 2017-10-28 DIAGNOSIS — M75102 Unspecified rotator cuff tear or rupture of left shoulder, not specified as traumatic: Secondary | ICD-10-CM | POA: Diagnosis not present

## 2017-10-28 DIAGNOSIS — I10 Essential (primary) hypertension: Secondary | ICD-10-CM | POA: Diagnosis not present

## 2017-10-28 DIAGNOSIS — R269 Unspecified abnormalities of gait and mobility: Secondary | ICD-10-CM | POA: Diagnosis not present

## 2017-10-28 DIAGNOSIS — E119 Type 2 diabetes mellitus without complications: Secondary | ICD-10-CM | POA: Diagnosis not present

## 2017-10-28 DIAGNOSIS — M25512 Pain in left shoulder: Secondary | ICD-10-CM | POA: Diagnosis not present

## 2017-10-30 DIAGNOSIS — I10 Essential (primary) hypertension: Secondary | ICD-10-CM | POA: Diagnosis not present

## 2017-10-30 DIAGNOSIS — R269 Unspecified abnormalities of gait and mobility: Secondary | ICD-10-CM | POA: Diagnosis not present

## 2017-10-30 DIAGNOSIS — L8995 Pressure ulcer of unspecified site, unstageable: Secondary | ICD-10-CM | POA: Diagnosis not present

## 2017-10-30 DIAGNOSIS — E119 Type 2 diabetes mellitus without complications: Secondary | ICD-10-CM | POA: Diagnosis not present

## 2017-10-30 DIAGNOSIS — M75102 Unspecified rotator cuff tear or rupture of left shoulder, not specified as traumatic: Secondary | ICD-10-CM | POA: Diagnosis not present

## 2017-10-30 DIAGNOSIS — M25512 Pain in left shoulder: Secondary | ICD-10-CM | POA: Diagnosis not present

## 2017-10-30 DIAGNOSIS — M545 Low back pain: Secondary | ICD-10-CM | POA: Diagnosis not present

## 2017-11-04 DIAGNOSIS — M75102 Unspecified rotator cuff tear or rupture of left shoulder, not specified as traumatic: Secondary | ICD-10-CM | POA: Diagnosis not present

## 2017-11-04 DIAGNOSIS — M25512 Pain in left shoulder: Secondary | ICD-10-CM | POA: Diagnosis not present

## 2017-11-04 DIAGNOSIS — I10 Essential (primary) hypertension: Secondary | ICD-10-CM | POA: Diagnosis not present

## 2017-11-04 DIAGNOSIS — R269 Unspecified abnormalities of gait and mobility: Secondary | ICD-10-CM | POA: Diagnosis not present

## 2017-11-04 DIAGNOSIS — E119 Type 2 diabetes mellitus without complications: Secondary | ICD-10-CM | POA: Diagnosis not present

## 2017-11-04 DIAGNOSIS — M545 Low back pain: Secondary | ICD-10-CM | POA: Diagnosis not present

## 2017-11-04 NOTE — Progress Notes (Signed)
Cassandra Stewart is a 67 y.o. female who presents for annual wellness visit and follow-up on chronic medical conditions.  She has the following concerns:  Complains of muscle spasm in her bilateral lower back for the past 2 days. She is seeing pain management for chronic back pain.  States Tylenol and using heating pad with relief.   States she is currently getting PT for a left shoulder partial RC tear.   She is using a walker at home for short distances but is in an electric wheelchair otherwise.   HTN- reports good compliance with medications and no side effects.   DM- being managed by Dr. Wyonia Hough. She has an appointment next week.  Vitamin B12 - low normal. Cannot take this. Stats is makes her tired.    Refuses pneumonia vaccine, flu, shingles and Tdap.     There is no immunization history on file for this patient. Last Pap smear: years ago- refuses  Last mammogram: 2-3 years ago. Ordered last year and she did not go.  Last colonoscopy: never Last DEXA: never. Ordered.   Dentist: years ago  Ophtho: Dr. Vanessa Erynne. Exercise:   Other doctors caring for patient include: Dr. Wyonia Hough- Endo Dr. Wynn Banker- Pain management  Dr. Vanessa Ariella- eyes  Dr. Franky Macho- back surgeon     Depression screen:  See questionnaire below.  Depression screen [facility] 2/9 11/05/2017 10/21/2017 09/09/2017 08/05/2016 08/01/2016  Decreased Interest 0 0 0 0 0  Down, Depressed, Hopeless 3 0 0 0 0  PHQ - 2 Score 3 0 0 0 0    Fall Risk Screen: see questionnaire below. Fall Risk  11/05/2017 10/21/2017 09/09/2017 06/10/2017 08/05/2016  Falls in the past year? No No No No Yes  Number falls in past yr: - - - - 2 or more  Injury with Fall? - - - - No  Risk Factor Category  - - - - High Fall Risk  Risk for fall due to : - - - - History of fall(s);Impaired balance/gait;Impaired vision;Medication side effect;Impaired mobility  Follow up - - - - Falls prevention discussed;Follow up appointment    ADL screen:  See questionnaire  below Functional Status Survey: Is the patient deaf or have difficulty hearing?: No Does the patient have difficulty seeing, even when wearing glasses/contacts?: No Does the patient have difficulty concentrating, remembering, or making decisions?: No Does the patient have difficulty walking or climbing stairs?: Yes(in wheelchair) Does the patient have difficulty dressing or bathing?: No Does the patient have difficulty doing errands alone such as visiting a doctor's office or shopping?: Yes   End of Life Discussion:  Patient does not have a living will and medical power of attorney. States her daughter is her next of kin. She lives in [place]. Forms provided again this year. MOST form filled out.   Review of Systems Constitutional: -fever, -chills, -sweats, -unexpected weight change, -anorexia, -fatigue Allergy: -sneezing, -itching, -congestion Dermatology: denies changing moles, rash, lumps, new worrisome lesions ENT: -runny nose, -ear pain, -sore throat, -hoarseness, -sinus pain, -teeth pain, -tinnitus, -hearing loss, -epistaxis Cardiology:  -chest pain, -palpitations, -edema, -orthopnea, -paroxysmal nocturnal dyspnea Respiratory: -cough, -shortness of breath, -dyspnea on exertion, -wheezing, -hemoptysis Gastroenterology: -abdominal pain, -nausea, -vomiting, -diarrhea, -constipation, -blood in stool, -changes in bowel movement, -dysphagia Hematology: -bleeding or bruising problems Musculoskeletal: -arthralgias, -myalgias, -joint swelling, +back pain, -neck pain, +cramping, -gait changes Ophthalmology: -vision changes, -eye redness, -itching, -discharge Urology: -dysuria, -difficulty urinating, -hematuria, -urinary frequency, -urgency, incontinence Neurology: -headache, + left leg weakness (chronic), -tingling, -numbness, -speech  abnormality, -memory loss, -falls, -dizziness Psychology:  +depressed mood related to inability to walk, -agitation, -sleep problems    PHYSICAL EXAM:  BP  120/70   Pulse 62   Ht 5' 4.5" (1.638 m)   Wt 233 lb 6.4 oz (105.9 kg)   BMI 39.44 kg/m   General Appearance: Alert, cooperative, no distress, appears stated age Head: Normocephalic, without obvious abnormality, atraumatic Eyes: PERRL, conjunctiva/corneas clear, EOM's intact Ears: Normal TM's and external ear canals Nose: Nares normal, mucosa normal, no drainage or sinus tenderness Throat: Lips, mucosa, and tongue normal; teeth and gums normal Neck: Supple, no lymphadenopathy; thyroid: no enlargement/tenderness/nodules; no carotid bruit or JVD Back: in wheelchair. Non tender spine or paraspinal muscles  Lungs: Clear to auscultation bilaterally without wheezes, rales or ronchi; respirations unlabored Chest Wall: No tenderness or deformity Heart: Regular rate and rhythm, S1 and S2 normal, no murmur, rub or gallop Breast Exam: declines. Mammogram ordered.  Abdomen: Soft, non-tender, nondistended, normoactive bowel sounds Genitalia: refused  Extremities: No clubbing, cyanosis. Left ankle and lower leg with trace edema.  Pulses: 2+ and symmetric all extremities Skin: Skin color, texture, turgor normal, no rashes or lesions Lymph nodes: Cervical, supraclavicular, and axillary nodes normal Neurologic: CNII-XII intact, chronic left leg weakness, sensation normal Psych: Normal mood, affect, hygiene and grooming.  ASSESSMENT/PLAN: Medicare annual wellness visit, subsequent  Essential hypertension - Plan: CBC with Differential/Platelet, Comprehensive metabolic panel  Hyperlipidemia, unspecified hyperlipidemia type - Plan: Lipid panel  Morbid obesity (HCC) - Plan: CBC with Differential/Platelet, Comprehensive metabolic panel, TSH, T4, free, Lipid panel  Estrogen deficiency - Plan: DG Bone Density  Routine general medical examination at a health care facility - Plan: CBC with Differential/Platelet, Comprehensive metabolic panel  Screening for breast cancer - Plan: MM DIGITAL SCREENING  BILATERAL  Parent refuses immunizations  Counseling regarding advanced directives  Spasm of muscle of lower back  Fatty infiltration of liver  CKD (chronic kidney disease) stage 3, GFR 30-59 ml/min (HCC)  Colon cancer screening - Plan: Cologuard  Personal history of noncompliance with medical treatment, presenting hazards to health  Back spasms are improved with Tylenol and heat. Continue for now and check labs. Follow up with back specialist.  HTN- blood pressure in goal range- continue current medication regimen.  Continue on statin. Check lipids.  CKD- check renal function. Discussed importance of good glycemic, blood pressure control.  Diabetes managed by Dr. Elvera Lennox. She is pleased with her progress and states happy with medication regimen.  Counseling on healthy diet.  Discussed that she did not get mammogram, DEXA or do cologuard as ordered last year. She is in agreement to do this, orders placed.  Refuses immunizations.  MOST form filled out with patient and on file.  Follow up pending labs or in 6 months for HTN, CKD.    Discussed monthly self breast exams and yearly mammograms; at least 30 minutes of aerobic activity at least 5 days/week and weight-bearing exercise 2x/week; proper sunscreen use reviewed; healthy diet, including goals of calcium and vitamin D intake and alcohol recommendations (less than or equal to 1 drink/day) reviewed; regular seatbelt use; changing batteries in smoke detectors.  Immunization recommendations discussed.  Colonoscopy recommendations reviewed   Medicare Attestation I have personally reviewed: The patient's medical and social history Their use of alcohol, tobacco or illicit drugs Their current medications and supplements The patient's functional ability including ADLs,fall risks, home safety risks, cognitive, and hearing and visual impairment Diet and physical activities Evidence for depression or mood disorders  The patient's weight,  height, and BMI have been recorded in the chart.  I have made referrals, counseling, and provided education to the patient based on review of the above and I have provided the patient with a written personalized care plan for preventive services.     Cassandra Blend, NP-C   11/05/2017

## 2017-11-05 ENCOUNTER — Ambulatory Visit (INDEPENDENT_AMBULATORY_CARE_PROVIDER_SITE_OTHER): Payer: Medicare Other | Admitting: Family Medicine

## 2017-11-05 ENCOUNTER — Encounter: Payer: Self-pay | Admitting: Family Medicine

## 2017-11-05 VITALS — BP 120/70 | HR 62 | Ht 64.5 in | Wt 233.4 lb

## 2017-11-05 DIAGNOSIS — Z9119 Patient's noncompliance with other medical treatment and regimen: Secondary | ICD-10-CM

## 2017-11-05 DIAGNOSIS — M62830 Muscle spasm of back: Secondary | ICD-10-CM | POA: Diagnosis not present

## 2017-11-05 DIAGNOSIS — Z7189 Other specified counseling: Secondary | ICD-10-CM

## 2017-11-05 DIAGNOSIS — Z0000 Encounter for general adult medical examination without abnormal findings: Secondary | ICD-10-CM

## 2017-11-05 DIAGNOSIS — Z2882 Immunization not carried out because of caregiver refusal: Secondary | ICD-10-CM | POA: Diagnosis not present

## 2017-11-05 DIAGNOSIS — E2839 Other primary ovarian failure: Secondary | ICD-10-CM | POA: Diagnosis not present

## 2017-11-05 DIAGNOSIS — Z1231 Encounter for screening mammogram for malignant neoplasm of breast: Secondary | ICD-10-CM

## 2017-11-05 DIAGNOSIS — K760 Fatty (change of) liver, not elsewhere classified: Secondary | ICD-10-CM

## 2017-11-05 DIAGNOSIS — E785 Hyperlipidemia, unspecified: Secondary | ICD-10-CM

## 2017-11-05 DIAGNOSIS — I10 Essential (primary) hypertension: Secondary | ICD-10-CM | POA: Diagnosis not present

## 2017-11-05 DIAGNOSIS — N183 Chronic kidney disease, stage 3 unspecified: Secondary | ICD-10-CM

## 2017-11-05 DIAGNOSIS — Z1239 Encounter for other screening for malignant neoplasm of breast: Secondary | ICD-10-CM

## 2017-11-05 DIAGNOSIS — Z91199 Patient's noncompliance with other medical treatment and regimen due to unspecified reason: Secondary | ICD-10-CM

## 2017-11-05 DIAGNOSIS — Z1211 Encounter for screening for malignant neoplasm of colon: Secondary | ICD-10-CM

## 2017-11-06 ENCOUNTER — Encounter: Payer: Self-pay | Admitting: Family Medicine

## 2017-11-06 DIAGNOSIS — M75102 Unspecified rotator cuff tear or rupture of left shoulder, not specified as traumatic: Secondary | ICD-10-CM | POA: Diagnosis not present

## 2017-11-06 DIAGNOSIS — M545 Low back pain: Secondary | ICD-10-CM | POA: Diagnosis not present

## 2017-11-06 DIAGNOSIS — R269 Unspecified abnormalities of gait and mobility: Secondary | ICD-10-CM | POA: Diagnosis not present

## 2017-11-06 DIAGNOSIS — M25512 Pain in left shoulder: Secondary | ICD-10-CM | POA: Diagnosis not present

## 2017-11-06 DIAGNOSIS — E119 Type 2 diabetes mellitus without complications: Secondary | ICD-10-CM | POA: Diagnosis not present

## 2017-11-06 DIAGNOSIS — I10 Essential (primary) hypertension: Secondary | ICD-10-CM | POA: Diagnosis not present

## 2017-11-06 LAB — LIPID PANEL
CHOL/HDL RATIO: 2.5 ratio (ref 0.0–4.4)
Cholesterol, Total: 147 mg/dL (ref 100–199)
HDL: 59 mg/dL (ref >39)
LDL CALC: 61 mg/dL (ref 0–99)
TRIGLYCERIDES: 133 mg/dL (ref 0–149)
VLDL CHOLESTEROL CAL: 27 mg/dL (ref 5–40)

## 2017-11-06 LAB — COMPREHENSIVE METABOLIC PANEL
ALBUMIN: 4.3 g/dL (ref 3.6–4.8)
ALT: 16 IU/L (ref 0–32)
AST: 17 IU/L (ref 0–40)
Albumin/Globulin Ratio: 1.5 (ref 1.2–2.2)
Alkaline Phosphatase: 77 IU/L (ref 39–117)
BUN / CREAT RATIO: 19 (ref 12–28)
BUN: 20 mg/dL (ref 8–27)
Bilirubin Total: 0.2 mg/dL (ref 0.0–1.2)
CALCIUM: 9.9 mg/dL (ref 8.7–10.3)
CHLORIDE: 103 mmol/L (ref 96–106)
CO2: 25 mmol/L (ref 20–29)
CREATININE: 1.06 mg/dL — AB (ref 0.57–1.00)
GFR, EST AFRICAN AMERICAN: 62 mL/min/{1.73_m2} (ref >59)
GFR, EST NON AFRICAN AMERICAN: 54 mL/min/{1.73_m2} — AB (ref >59)
GLUCOSE: 69 mg/dL (ref 65–99)
Globulin, Total: 2.8 g/dL (ref 1.5–4.5)
Potassium: 4.9 mmol/L (ref 3.5–5.2)
Sodium: 144 mmol/L (ref 134–144)
TOTAL PROTEIN: 7.1 g/dL (ref 6.0–8.5)

## 2017-11-06 LAB — CBC WITH DIFFERENTIAL/PLATELET
BASOS: 1 %
Basophils Absolute: 0 10*3/uL (ref 0.0–0.2)
EOS (ABSOLUTE): 0.1 10*3/uL (ref 0.0–0.4)
Eos: 1 %
HEMOGLOBIN: 12.1 g/dL (ref 11.1–15.9)
Hematocrit: 37.4 % (ref 34.0–46.6)
IMMATURE GRANS (ABS): 0 10*3/uL (ref 0.0–0.1)
IMMATURE GRANULOCYTES: 0 %
Lymphocytes Absolute: 2.5 10*3/uL (ref 0.7–3.1)
Lymphs: 32 %
MCH: 28.8 pg (ref 26.6–33.0)
MCHC: 32.4 g/dL (ref 31.5–35.7)
MCV: 89 fL (ref 79–97)
MONOCYTES: 6 %
Monocytes Absolute: 0.5 10*3/uL (ref 0.1–0.9)
NEUTROS ABS: 4.7 10*3/uL (ref 1.4–7.0)
Neutrophils: 60 %
Platelets: 333 10*3/uL (ref 150–450)
RBC: 4.2 x10E6/uL (ref 3.77–5.28)
RDW: 12.7 % (ref 12.3–15.4)
WBC: 7.9 10*3/uL (ref 3.4–10.8)

## 2017-11-06 LAB — TSH: TSH: 2 u[IU]/mL (ref 0.450–4.500)

## 2017-11-06 LAB — T4, FREE: Free T4: 1.07 ng/dL (ref 0.82–1.77)

## 2017-11-11 DIAGNOSIS — M75102 Unspecified rotator cuff tear or rupture of left shoulder, not specified as traumatic: Secondary | ICD-10-CM | POA: Diagnosis not present

## 2017-11-11 DIAGNOSIS — R269 Unspecified abnormalities of gait and mobility: Secondary | ICD-10-CM | POA: Diagnosis not present

## 2017-11-11 DIAGNOSIS — E119 Type 2 diabetes mellitus without complications: Secondary | ICD-10-CM | POA: Diagnosis not present

## 2017-11-11 DIAGNOSIS — M25512 Pain in left shoulder: Secondary | ICD-10-CM | POA: Diagnosis not present

## 2017-11-11 DIAGNOSIS — M545 Low back pain: Secondary | ICD-10-CM | POA: Diagnosis not present

## 2017-11-11 DIAGNOSIS — I10 Essential (primary) hypertension: Secondary | ICD-10-CM | POA: Diagnosis not present

## 2017-11-12 ENCOUNTER — Encounter: Payer: Self-pay | Admitting: Internal Medicine

## 2017-11-12 ENCOUNTER — Ambulatory Visit (INDEPENDENT_AMBULATORY_CARE_PROVIDER_SITE_OTHER): Payer: Medicare Other | Admitting: Internal Medicine

## 2017-11-12 VITALS — BP 122/68 | HR 68 | Ht 64.5 in | Wt 234.0 lb

## 2017-11-12 DIAGNOSIS — E1165 Type 2 diabetes mellitus with hyperglycemia: Secondary | ICD-10-CM

## 2017-11-12 DIAGNOSIS — E1140 Type 2 diabetes mellitus with diabetic neuropathy, unspecified: Secondary | ICD-10-CM

## 2017-11-12 DIAGNOSIS — E538 Deficiency of other specified B group vitamins: Secondary | ICD-10-CM | POA: Diagnosis not present

## 2017-11-12 DIAGNOSIS — E782 Mixed hyperlipidemia: Secondary | ICD-10-CM

## 2017-11-12 DIAGNOSIS — IMO0002 Reserved for concepts with insufficient information to code with codable children: Secondary | ICD-10-CM

## 2017-11-12 LAB — POCT GLYCOSYLATED HEMOGLOBIN (HGB A1C): HEMOGLOBIN A1C: 6.1 % — AB (ref 4.0–5.6)

## 2017-11-12 MED ORDER — TOUJEO SOLOSTAR 300 UNIT/ML ~~LOC~~ SOPN: 30.0000 [IU] | PEN_INJECTOR | Freq: Every day | SUBCUTANEOUS | 3 refills | Status: DC

## 2017-11-12 MED ORDER — INSULIN LISPRO 100 UNIT/ML (KWIKPEN): PEN_INJECTOR | SUBCUTANEOUS | 3 refills | Status: DC

## 2017-11-12 NOTE — Patient Instructions (Addendum)
Please continue: - Metformin ER 500 mg 1-2x a day with meals. - Trulicity 0.75 mg weekly  Please decrease: - Toujeo to 30 units at night - Humalog 8-10 units before each of the 3 meals  Please continue B12 5000 mcg daily.  Please return in 3-4 months with your sugar log.

## 2017-11-12 NOTE — Progress Notes (Signed)
-Patient ID: Cassandra Stewart, female   DOB: 05/13/1950, 67 y.o.   MRN: [id number]   HPI: Cassandra Stewart is a 67 y.o.-year-old female, returning for follow-up for DM2, dx in 1992, insulin-dependent since 2006, uncontrolled, with complications (CKD stage 3, PN, DR).  Last visit 4 months ago.  Patient contacted us in 08/2017 that her sugars were quite low, in the 60s are under, after we started Trulicity at last visit.  We reduced her basal and bolus insulin doses then.  No significant lows since then, only one sugar in the 60s this morning.  Last hemoglobin A1c was: Lab Results  Component Value Date   HGBA1C 9.0 07/11/2017   HGBA1C 9.0 04/14/2017   HGBA1C 8.7% 10/24/2016   HGBA1C 9.6% 07/25/2016   HGBA1C 10.5% 03/04/2016   HGBA1C 8.1% 10/25/2015   HGBA1C 9.7 (H) 07/04/2015   Pt was on a regimen of: - Metformin 500 mg 1x a day with dinner.  She had diarrhea with a higher dose. - Toujeo 45 units in am - Humalog 18 units 2-3x a day, before meals Tried: Actos, Lantus.  Now on: - Metformin ER 500 mg 1-2x a day with meals. - Toujeo 45 >> 38 units at night - Humalog: - 12-14 >> 8 units for a smaller meal - 18 >> 10 units for a regular meal - 20-22 >> 12 units for a larger meal or if you have dessert - Trulicity 0.75 mg weekly - started 0/2019  She checks sugars 1-2 times a day: - am: 146-210 >> 140-183 >> 63, 74-91 - 2h after b'fast: n/c - before lunch: n/c >> 80-110 - 2h after lunch: n/c - before dinner: n/c >>  80-110 - 2h after dinner: n/c - bedtime: n/c - nighttime: n/c Lowest sugar was 67 >> 67 (skipped meals) >> 60; she has hypoglycemia awareness in the 80s. Highest sugar was 300s >> 183 >> 148 in last 3 mo.  Glucometer: Freestyle  Pt's meals are: - Breakfast/brunch: egg, bacon, toast - Lunch: salad or skips 3x a day - Dinner: chicken/fish + vegetables - Snacks: no She gets 1 meal a day from [employer].  -+ Stage III CKD; last BUN/creatinine:  Lab Results   Component Value Date   BUN 20 11/05/2017   BUN 23 10/24/2016   CREATININE 1.06 (H) 11/05/2017   CREATININE 1.14 (H) 10/24/2016   Lab Results  Component Value Date   GFRAA 62 11/05/2017   GFRAA 57 (L) 10/24/2016   GFRAA 57 (L) 06/30/2016   GFRAA 71 03/04/2016   GFRAA 75 10/25/2015  On lisinopril 10. -+ HL; last set of lipids: Lab Results  Component Value Date   CHOL 147 11/05/2017   HDL 59 11/05/2017   LDLCALC 61 11/05/2017   TRIG 133 11/05/2017   CHOLHDL 2.5 11/05/2017  On lovastatin 40. - last eye exam was in 02/2017: + DR. + cataract sx OU.  -+ Numbness and tingling in her feet.  On Neurontin 300 mg 3X a day On ASA 81.  Low vitamin B12:  Reviewed her vitamin B12 levels: Lab Results  Component Value Date   VITAMINB12 300 04/14/2017   VITAMINB12 478 10/25/2015  05/01/2016: Vit B12 248.  She continues on 5000 mcg B12 daily.  She continues to be in a wheelchair as her left leg is very weak.  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  She had surgery in 2016 but strength did not improve.  ROS: Constitutional: no weight gain/+ weight   loss, no fatigue, no subjective hyperthermia, no subjective hypothermia Eyes: no blurry vision, no xerophthalmia ENT: no sore throat, no nodules palpated in throat, no dysphagia, no odynophagia, no hoarseness Cardiovascular: no CP/no SOB/no palpitations/no leg swelling Respiratory: no cough/no SOB/no wheezing Gastrointestinal: no N/no V/no D/no C/no acid reflux Musculoskeletal: no muscle aches/no joint aches Skin: no rashes, no hair loss Neurological: no tremors/+ numbness/+ tingling/no dizziness  I reviewed pt's medications, allergies, PMH, social hx, family hx, and changes were documented in the history of present illness. Otherwise, unchanged from my initial visit note.  Past Medical History:  Diagnosis Date  . Elevated LFTs   . Fatty liver    on [place].   . Hyperlipemia   . Hypertension   . Uncontrolled type 2  diabetes with neuropathy (HCC)    Past Surgical History:  Procedure Laterality Date  . APPENDECTOMY  1969  . CATARACT EXTRACTION     May 21016  . LUMBAR DISC SURGERY    . LUMBAR LAMINECTOMY/DECOMPRESSION MICRODISCECTOMY N/A 07/06/2015   Procedure: Thoracic ten- eleven laminectomy for spinal canal decompression;  Surgeon: Kyle Cabbell, MD;  Location: [facility];  Service: Orthopedics;  Laterality: N/A;   Social History   Social History  . Marital status: Divorced    Spouse name: N/A  . Number of children: 0   Occupational History  . None   Social History Main Topics  . Smoking status: Never Smoker  . Smokeless tobacco: Never Used  . Alcohol use No  . Drug use: No   Current Outpatient Medications on File Prior to Visit  Medication Sig Dispense Refill  . ACCU-CHEK SOFTCLIX LANCETS lancets Test 3 times daily. Pt uses accu-chek softclix lancing device 200 each 3  . acetaminophen (ACETAMINOPHEN 8 HOUR) 650 MG CR tablet Take 650 mg by mouth every 8 (eight) hours as needed for pain.    . albuterol (PROVENTIL HFA) 108 (90 Base) MCG/ACT inhaler Inhale into the lungs.    . amLODipine (NORVASC) 10 MG tablet TAKE 1 TABLET BY MOUTH EVERY MORNING 90 tablet 0  . aspirin EC 81 MG tablet Take 81 mg by mouth every evening.    . Blood Glucose Monitoring Suppl (ACCU-CHEK AVIVA PLUS) w/Device KIT Use as advised 1 kit 1  . Cholecalciferol (VITAMIN D PO) Take 1 tablet by mouth daily.    . gabapentin (NEURONTIN) 300 MG capsule TAKE 1 CAPSULE BY MOUTH THREE TIMES DAILY 270 capsule 1  . glucose blood (ACCU-CHEK AVIVA PLUS) test strip Use as instructed 4x a day 200 each 11  . glucose blood test strip Test 3 times daily. Dx E11.9. Pt uses accu-chek aviva plus meter 200 each 3  . hydrochlorothiazide (MICROZIDE) 12.5 MG capsule TAKE 1 CAPSULE BY MOUTH DAILY 90 capsule 0  . insulin lispro (HUMALOG KWIKPEN) 100 UNIT/ML KiwkPen INJECT 12-22 UNITS UNDER THE SKIN THREE TIMES DAILY 45 mL 3  . Insulin Pen Needle  (B-D ULTRAFINE III SHORT PEN) 31G X 8 MM MISC 3 (three) times daily before meals.    . Insulin Pen Needle (CAREFINE PEN NEEDLES) 32G X 4 MM MISC Use 4x a day 300 each 3  . lisinopril (PRINIVIL,ZESTRIL) 10 MG tablet TAKE 1 TABLET BY MOUTH EVERY MORNING 90 tablet 0  . lovastatin (MEVACOR) 40 MG tablet TAKE 1 TABLET BY MOUTH AT BEDTIME 90 tablet 0  . metFORMIN (GLUCOPHAGE-XR) 500 MG 24 hr tablet Take 1 tablet (500 mg total) by mouth 2 (two) times daily with a meal. (Patient taking differently:   Take 500 mg by mouth daily. ) 90 tablet 1  . polyethylene glycol (MIRALAX / GLYCOLAX) packet Take 17 g by mouth daily as needed for mild constipation. 14 each 0  . TOUJEO SOLOSTAR 300 UNIT/ML SOPN Inject 45 Units into the skin at bedtime. 45 mL 3  . TRULICITY 0.75 MG/0.5ML SOPN INJECT 0.75 MG UNDER THE SKIN EVERY WEEK 6 mL 1   No current facility-administered medications on file prior to visit.    Allergies  Allergen Reactions  . Other     PT PREFERS TO NOT HAVE ANY NARCOTIC MEDICATIONS  . Chlorhexidine Rash    Burning after using CHG wipes-used for surgery  . Oxycodone Hcl     Other reaction(s): Hallucination Marked hallucinations and palpitations following dose of 10mg on 04/30/2015    Family History  Problem Relation Age of Onset  . Diabetes Mother   . Heart disease Mother   . Kidney disease Brother        Two brothers on ESRD  . Amblyopia Neg Hx   . Blindness Neg Hx   . Cataracts Neg Hx   . Glaucoma Neg Hx   . Macular degeneration Neg Hx   . Retinal detachment Neg Hx   . Strabismus Neg Hx   . Retinitis pigmentosa Neg Hx    Pt has FH of DM in M, MGM, PGM, M aunt, uncles.  PE: BP 122/68   Pulse 68   Ht 5' 4.5" (1.638 m)   Wt 234 lb (106.1 kg)   SpO2 97%   BMI 39.55 kg/m ; Wt Readings from Last 3 Encounters:  11/12/17 234 lb (106.1 kg)  11/05/17 233 lb 6.4 oz (105.9 kg)  10/21/17 237 lb 6.4 oz (107.7 kg)   Constitutional: overweight, in NAD, in wheelchair Eyes: PERRLA, EOMI, no  exophthalmos ENT: moist mucous membranes, no thyromegaly, no cervical lymphadenopathy Cardiovascular: RRR, No MRG Respiratory: CTA B Gastrointestinal: abdomen soft, NT, ND, BS+ Musculoskeletal: no deformities, strength intact in all 4 Skin: moist, warm, no rashes Neurological: no tremor with outstretched hands, DTR normal in all 4  ASSESSMENT: 1. DM2, insulin-dependent, uncontrolled, with complications - CKD stage 3 - PN - DR  - Of note, we tried to prescribe the Freestyle libre CGM for her but this was not covered by the insurance  2. Low B12  3. HL  PLAN:  1. Patient with long-standing, uncontrolled, type 2 diabetes, on oral antidiabetic regimen with metformin, also, basal-bolus insulin regimen and GLP-1 receptor agonist added at last visit.  After addition of Trulicity, her sugars improved significantly and she started to develop lows.  2 months ago we decreased her Toujeo and Humalog doses but we continued metformin and Trulicity.  At last visit, we also discussed about the benefits of a plant-based diet, which she was trying to adopt. - At last visit, her HbA1c was 9%, stable, high. - Sugars improved significantly since last visit after starting Trulicity.  We were able to decrease the Toujeo and Humalog doses but her sugars are still on the lower end of normal, so we can decrease them even further.  At next visit, I plan to increase her Trulicity dose and drop the insulin doses more. - I suggested to:  Patient Instructions  Please continue: - Metformin ER 500 mg 1-2x a day with meals. - Trulicity 0.75 mg weekly  Please decrease: - Toujeo to 30 units at night - Humalog 8-10 units before each of the 3 meals  Please continue B12 5000   mcg daily.  Please return in 3-4 months with your sugar log.   - today, HbA1c is 6.1% (MUCH better) - continue checking sugars at different times of the day - check 3x a day, rotating checks - advised for yearly eye exams >> she is UTD -  Return to clinic in 3-4 mo with sugar log    2. Low B12 -B12 was on the low side before -At last visit, we increased her B12 supplement from 1000 to 5000 mcg daily -We will recheck the B12 level atnext visit  3. HL - Reviewed latest lipid panel from earlier this month and all fractions were at goal Lab Results  Component Value Date   CHOL 147 11/05/2017   HDL 59 11/05/2017   LDLCALC 61 11/05/2017   TRIG 133 11/05/2017   CHOLHDL 2.5 11/05/2017  - Continues Mevacor without side effects.   Cristina Gherghe, MD PhD [facility]   

## 2017-11-12 NOTE — Addendum Note (Signed)
Addended by: Darliss RidgelONGER, MELISSA I on: 11/12/2017 10:54 AM   Modules accepted: Orders

## 2017-11-13 DIAGNOSIS — R269 Unspecified abnormalities of gait and mobility: Secondary | ICD-10-CM | POA: Diagnosis not present

## 2017-11-13 DIAGNOSIS — M75102 Unspecified rotator cuff tear or rupture of left shoulder, not specified as traumatic: Secondary | ICD-10-CM | POA: Diagnosis not present

## 2017-11-13 DIAGNOSIS — E119 Type 2 diabetes mellitus without complications: Secondary | ICD-10-CM | POA: Diagnosis not present

## 2017-11-13 DIAGNOSIS — M25512 Pain in left shoulder: Secondary | ICD-10-CM | POA: Diagnosis not present

## 2017-11-13 DIAGNOSIS — I10 Essential (primary) hypertension: Secondary | ICD-10-CM | POA: Diagnosis not present

## 2017-11-13 DIAGNOSIS — M545 Low back pain: Secondary | ICD-10-CM | POA: Diagnosis not present

## 2017-11-23 ENCOUNTER — Emergency Department (HOSPITAL_COMMUNITY)
Admission: EM | Admit: 2017-11-23 | Discharge: 2017-11-23 | Disposition: A | Discharge status: Home / Self Care | Payer: Medicare Other | Attending: Emergency Medicine | Admitting: Emergency Medicine

## 2017-11-23 ENCOUNTER — Emergency Department (HOSPITAL_COMMUNITY): Payer: Medicare Other

## 2017-11-23 ENCOUNTER — Encounter (HOSPITAL_COMMUNITY): Payer: Self-pay | Admitting: Emergency Medicine

## 2017-11-23 DIAGNOSIS — I129 Hypertensive chronic kidney disease with stage 1 through stage 4 chronic kidney disease, or unspecified chronic kidney disease: Secondary | ICD-10-CM | POA: Insufficient documentation

## 2017-11-23 DIAGNOSIS — Z7982 Long term (current) use of aspirin: Secondary | ICD-10-CM | POA: Diagnosis not present

## 2017-11-23 DIAGNOSIS — R109 Unspecified abdominal pain: Secondary | ICD-10-CM

## 2017-11-23 DIAGNOSIS — R531 Weakness: Secondary | ICD-10-CM | POA: Diagnosis not present

## 2017-11-23 DIAGNOSIS — Z743 Need for continuous supervision: Secondary | ICD-10-CM | POA: Diagnosis not present

## 2017-11-23 DIAGNOSIS — E1122 Type 2 diabetes mellitus with diabetic chronic kidney disease: Secondary | ICD-10-CM | POA: Diagnosis not present

## 2017-11-23 DIAGNOSIS — Z794 Long term (current) use of insulin: Secondary | ICD-10-CM | POA: Insufficient documentation

## 2017-11-23 DIAGNOSIS — N183 Chronic kidney disease, stage 3 (moderate): Secondary | ICD-10-CM | POA: Insufficient documentation

## 2017-11-23 DIAGNOSIS — M545 Low back pain: Secondary | ICD-10-CM | POA: Diagnosis not present

## 2017-11-23 DIAGNOSIS — R52 Pain, unspecified: Secondary | ICD-10-CM | POA: Diagnosis not present

## 2017-11-23 DIAGNOSIS — Z79899 Other long term (current) drug therapy: Secondary | ICD-10-CM | POA: Insufficient documentation

## 2017-11-23 DIAGNOSIS — M79605 Pain in left leg: Secondary | ICD-10-CM | POA: Diagnosis not present

## 2017-11-23 DIAGNOSIS — M6281 Muscle weakness (generalized): Secondary | ICD-10-CM | POA: Diagnosis not present

## 2017-11-23 LAB — URINALYSIS, ROUTINE W REFLEX MICROSCOPIC
Bilirubin Urine: NEGATIVE
GLUCOSE, UA: NEGATIVE mg/dL
HGB URINE DIPSTICK: NEGATIVE
Ketones, ur: NEGATIVE mg/dL
Leukocytes, UA: NEGATIVE
Nitrite: NEGATIVE
PH: 5 (ref 5.0–8.0)
Protein, ur: NEGATIVE mg/dL
Specific Gravity, Urine: 1.017 (ref 1.005–1.030)

## 2017-11-23 LAB — CBC WITH DIFFERENTIAL/PLATELET
Abs Immature Granulocytes: 0 10*3/uL (ref 0.0–0.1)
Basophils Absolute: 0.1 10*3/uL (ref 0.0–0.1)
Basophils Relative: 1 %
EOS PCT: 3 %
Eosinophils Absolute: 0.2 10*3/uL (ref 0.0–0.7)
HCT: 39.7 % (ref 36.0–46.0)
HEMOGLOBIN: 12.2 g/dL (ref 12.0–15.0)
Immature Granulocytes: 0 %
LYMPHS ABS: 2.1 10*3/uL (ref 0.7–4.0)
LYMPHS PCT: 34 %
MCH: 28.8 pg (ref 26.0–34.0)
MCHC: 30.7 g/dL (ref 30.0–36.0)
MCV: 93.6 fL (ref 78.0–100.0)
MONO ABS: 0.5 10*3/uL (ref 0.1–1.0)
Monocytes Relative: 8 %
Neutro Abs: 3.3 10*3/uL (ref 1.7–7.7)
Neutrophils Relative %: 54 %
Platelets: 294 10*3/uL (ref 150–400)
RBC: 4.24 MIL/uL (ref 3.87–5.11)
RDW: 13.1 % (ref 11.5–15.5)
WBC: 6.1 10*3/uL (ref 4.0–10.5)

## 2017-11-23 LAB — BASIC METABOLIC PANEL
Anion gap: 8 (ref 5–15)
BUN: 21 mg/dL (ref 8–23)
CALCIUM: 9.4 mg/dL (ref 8.9–10.3)
CHLORIDE: 107 mmol/L (ref 98–111)
CO2: 25 mmol/L (ref 22–32)
Creatinine, Ser: 1.1 mg/dL — ABNORMAL HIGH (ref 0.44–1.00)
GFR calc non Af Amer: 50 mL/min — ABNORMAL LOW (ref >60)
GFR, EST AFRICAN AMERICAN: 58 mL/min — AB (ref >60)
GLUCOSE: 103 mg/dL — AB (ref 70–99)
Potassium: 4.8 mmol/L (ref 3.5–5.1)
Sodium: 140 mmol/L (ref 135–145)

## 2017-11-23 MED ORDER — DICLOFENAC SODIUM 1 % TD GEL
2.0000 g | Freq: Once | TRANSDERMAL | Status: AC
  Administered 2017-11-23: 2 g via TOPICAL
  Filled 2017-11-23: qty 100

## 2017-11-23 MED ORDER — DICLOFENAC SODIUM 1 % TD GEL: 2.0000 g | Freq: Four times a day (QID) | TRANSDERMAL | Status: DC | PRN

## 2017-11-23 NOTE — ED Provider Notes (Signed)
[facility] EMERGENCY DEPARTMENT Provider Note   CSN: [id number] Arrival date & time: 11/23/17  1133     History   Chief Complaint Chief Complaint  Patient presents with  . Flank Pain  . Extremity Weakness    HPI Cassandra Stewart is a 67 y.o. female.  The history is provided by the patient. No language interpreter was used.  Flank Pain   Extremity Weakness    Cassandra Stewart is a 67 y.o. female who presents to the Emergency Department complaining of flank pain. She presents to the emergency department for evaluation of three weeks of left sided flank/low back pain. Pain is constant in nature and described as a feeling of movement inside. Pain is worse in the mornings as well as with weight-bearing. She is wheelchair dependent due to chronic left lower extremity weakness and pain that is been present for the last three years. She has chronic pain in this area of her left low back but the intensity has significantly worsened over the last three weeks. Now her pain is severe enough that she is having difficulty with transfers into her wheelchair. She denies any fevers, nausea, vomiting, chest pain, shortness of breath, abdominal pain, dysuria. She does wear depends because she is having too much weakness to get to the bathroom in time. She has been independent for the last several years. She has a history of hypertension and diabetes. She is followed by pain management but does not take any narcotics. She has been manager pain with Tylenol and has decreased how much Tylenol she is taking over the last week. Past Medical History:  Diagnosis Date  . Elevated LFTs   . Fatty liver    on [place].   Marland Kitchen Hyperlipemia   . Hypertension   . Uncontrolled type 2 diabetes with neuropathy [facility])     Patient Active Problem List   Diagnosis Date Noted  . Personal history of noncompliance with medical treatment, presenting hazards to health 01/29/2017  . Low serum vitamin B12  11/15/2016  . CKD (chronic kidney disease) stage 3, GFR 30-59 ml/min (HCC) 10/24/2016  . Fatty infiltration of liver 10/24/2016  . Hyperlipemia   . Estrogen deficiency 10/25/2015  . Thoracic spinal stenosis 07/06/2015  . UTI (lower urinary tract infection) 07/04/2015  . Uncontrolled type 2 diabetes with neuropathy ([place]) 07/04/2015  . Essential hypertension 07/04/2015  . Morbid obesity ([place]) 07/04/2015  . Inability to walk 07/04/2015  . Weakness of left leg 07/04/2015  . Elevated LFTs 07/04/2015    Past Surgical History:  Procedure Laterality Date  . APPENDECTOMY  1969  . CATARACT EXTRACTION     May 21016  . LUMBAR DISC SURGERY    . LUMBAR LAMINECTOMY/DECOMPRESSION MICRODISCECTOMY N/A 07/06/2015   Procedure: Thoracic ten- eleven laminectomy for spinal canal decompression;  Surgeon: Ashok Pall, MD;  Location: [place] [facility];  Service: Orthopedics;  Laterality: N/A;     OB History   None      Home Medications    Prior to Admission medications   Medication Sig Start Date End Date Taking? Authorizing Provider  ACCU-CHEK SOFTCLIX LANCETS lancets Test 3 times daily. Pt uses accu-chek softclix lancing device 07/25/16   Henson, Vickie L, NP-C  acetaminophen (ACETAMINOPHEN 8 HOUR) 650 MG CR tablet Take 650 mg by mouth every 8 (eight) hours as needed for pain.    [provider]  albuterol (PROVENTIL HFA) 108 (90 Base) MCG/ACT inhaler Inhale into the lungs. 05/13/14   [provider]  amLODipine (NORVASC) 10 MG tablet TAKE 1 TABLET BY MOUTH EVERY MORNING 10/21/17   Henson, Vickie L, NP-C  aspirin EC 81 MG tablet Take 81 mg by mouth every evening.    [provider]  Blood Glucose Monitoring Suppl (ACCU-CHEK AVIVA PLUS) w/Device KIT Use as advised 07/11/17   Philemon Kingdom, MD  Cholecalciferol (VITAMIN D PO) Take 1 tablet by mouth daily.    [provider]  gabapentin (NEURONTIN) 300 MG capsule TAKE 1 CAPSULE BY MOUTH THREE TIMES DAILY 11/25/16   Henson,  Vickie L, NP-C  glucose blood (ACCU-CHEK AVIVA PLUS) test strip Use as instructed 4x a day 07/11/17   Philemon Kingdom, MD  glucose blood test strip Test 3 times daily. Dx E11.9. Pt uses accu-chek aviva plus meter 07/25/16   Henson, Vickie L, NP-C  hydrochlorothiazide (MICROZIDE) 12.5 MG capsule TAKE 1 CAPSULE BY MOUTH DAILY 09/26/17   Henson, Vickie L, NP-C  insulin lispro (HUMALOG KWIKPEN) 100 UNIT/ML KiwkPen INJECT 8-10 UNITS UNDER THE SKIN THREE TIMES DAILY 11/12/17   Philemon Kingdom, MD  Insulin Pen Needle (B-D ULTRAFINE III SHORT PEN) 31G X 8 MM MISC 3 (three) times daily before meals. 05/13/14   [provider]  Insulin Pen Needle (CAREFINE PEN NEEDLES) 32G X 4 MM MISC Use 4x a day 11/15/16   Philemon Kingdom, MD  lisinopril (PRINIVIL,ZESTRIL) 10 MG tablet TAKE 1 TABLET BY MOUTH EVERY MORNING 09/17/17   Rita Ohara, MD  lovastatin (MEVACOR) 40 MG tablet TAKE 1 TABLET BY MOUTH AT BEDTIME 09/26/17   Henson, Vickie L, NP-C  metFORMIN (GLUCOPHAGE-XR) 500 MG 24 hr tablet Take 1 tablet (500 mg total) by mouth 2 (two) times daily with a meal. Patient taking differently: Take 500 mg by mouth daily.  11/28/16   Philemon Kingdom, MD  polyethylene glycol (MIRALAX / Floria Raveling) packet Take 17 g by mouth daily as needed for mild constipation. 07/10/15   Elgergawy, Silver Huguenin, MD  TOUJEO SOLOSTAR 300 UNIT/ML SOPN Inject 30 Units into the skin at bedtime. 11/12/17   Philemon Kingdom, MD  TRULICITY 0.81 KG/8.1EH SOPN INJECT 0.75 MG UNDER THE SKIN EVERY WEEK 10/17/17   Philemon Kingdom, MD    Family History Family History  Problem Relation Age of Onset  . Diabetes Mother   . Heart disease Mother   . Kidney disease Brother        Two brothers on ESRD  . Amblyopia Neg Hx   . Blindness Neg Hx   . Cataracts Neg Hx   . Glaucoma Neg Hx   . Macular degeneration Neg Hx   . Retinal detachment Neg Hx   . Strabismus Neg Hx   . Retinitis pigmentosa Neg Hx     Social History Social History   Tobacco Use    . Smoking status: Never Smoker  . Smokeless tobacco: Never Used  Substance Use Topics  . Alcohol use: No  . Drug use: No     Allergies   Other; Chlorhexidine; and Oxycodone hcl   Review of Systems Review of Systems  Genitourinary: Positive for flank pain.  Musculoskeletal: Positive for extremity weakness.  All other systems reviewed and are negative.    Physical Exam Updated Vital Signs BP (!) 147/55   Pulse 60   Temp (!) 97.5 F (36.4 C) (Oral)   Resp 20   Ht 5' 4"  (1.626 m)   Wt 105.2 kg   SpO2 100%   BMI 39.82 kg/m   Physical Exam  Constitutional: She is oriented  to person, place, and time. She appears well-developed and well-nourished.  HENT:  Head: Normocephalic and atraumatic.  Cardiovascular: Normal rate and regular rhythm.  No murmur heard. Pulmonary/Chest: Effort normal and breath sounds normal. No respiratory distress.  Abdominal: Soft. There is no tenderness. There is no rebound and no guarding.  Musculoskeletal: She exhibits no edema.  2+ DP pulses bilaterally. There is tenderness to palpation just superior to the left SI joint. No midline thoracic or lumbar tenderness.  Neurological: She is alert and oriented to person, place, and time.  Unable to lift either leg off the stretcher due to pain referred to the left low back. There is five out of five dorsiflexion and plantarflexion bilaterally. Sensation to light touch intact and bilateral lower extremities.  Skin: Skin is warm and dry.  Psychiatric: She has a normal mood and affect. Her behavior is normal.  Nursing note and vitals reviewed.    ED Treatments / Results  Labs (all labs ordered are listed, but only abnormal results are displayed) Labs Reviewed  BASIC METABOLIC PANEL - Abnormal; Notable for the following components:      Result Value   Glucose, Bld 103 (*)    Creatinine, Ser 1.10 (*)    GFR calc non Af Amer 50 (*)    GFR calc Af Amer 58 (*)    All other components within normal  limits  URINALYSIS, ROUTINE W REFLEX MICROSCOPIC  CBC WITH DIFFERENTIAL/PLATELET    EKG None  Radiology Ct Renal Stone Study  Result Date: 11/23/2017 CLINICAL DATA:  Left flank pain. EXAM: CT ABDOMEN AND PELVIS WITHOUT CONTRAST TECHNIQUE: Multidetector CT imaging of the abdomen and pelvis was performed following the standard protocol without IV contrast. COMPARISON:  None. FINDINGS: Lower chest: No acute abnormality. Hepatobiliary: No focal liver abnormality is seen. No gallstones, gallbladder wall thickening, or biliary dilatation. Pancreas: Unremarkable. No pancreatic ductal dilatation or surrounding inflammatory changes. Spleen: Normal in size without focal abnormality. Adrenals/Urinary Tract: Adrenal glands are unremarkable. Kidneys are normal, without renal calculi, focal lesion, or hydronephrosis. Bladder is unremarkable. Stomach/Bowel: Status post appendectomy. Stomach is unremarkable. There is no evidence of bowel obstruction or inflammation. Vascular/Lymphatic: Aortic atherosclerosis. No enlarged abdominal or pelvic lymph nodes. Reproductive: Uterus and bilateral adnexa are unremarkable. Other: No abdominal wall hernia or abnormality. No abdominopelvic ascites. Musculoskeletal: No acute or significant osseous findings. IMPRESSION: No acute abnormality seen in the abdomen or pelvis. Electronically Signed   By: Marijo Conception, M.D.   On: 11/23/2017 19:13    Procedures Procedures (including critical care time)  Medications Ordered in ED Medications  diclofenac sodium (VOLTAREN) 1 % transdermal gel 2 g (has no administration in time range)  diclofenac sodium (VOLTAREN) 1 % transdermal gel 2 g (2 g Topical Given 11/23/17 1703)     Initial Impression / Assessment and Plan / ED Course  I have reviewed the triage vital signs and the nursing notes.  Pertinent labs & imaging results that were available during my care of the patient were reviewed by me and considered in my medical decision  making (see chart for details).     Should here for evaluation of acute on chronic left sided low back pain. She is having pain that limits activities, no new weakness. Imaging negative for AAA, renal colic. UA is not consistent with UTI. After Voltaren gel her pain is improved. Presentation is not consistent with cauda equina. Plan to discharge home with outpatient follow-up and return precautions.  Final Clinical Impressions(s) /  ED Diagnoses   Final diagnoses:  Flank pain    ED Discharge Orders    None       Quintella Reichert, MD 11/23/17 2046

## 2017-11-23 NOTE — ED Triage Notes (Signed)
EMS stated, Ive had left flank pain for the last 3 weeks and let leg weakness

## 2017-11-23 NOTE — ED Triage Notes (Signed)
Pt. Stated, I always have left leg weakness but its worse in the last 3 weeks, to the point Im unable to go to the bathroom by myself.

## 2017-11-23 NOTE — ED Notes (Signed)
Pt was able to stand and pivot from wheelchair to bed, however pt needed two assist.

## 2017-11-23 NOTE — ED Notes (Signed)
Purewick in place and hooked up to suction at this time.

## 2017-11-23 NOTE — ED Notes (Signed)
Pt alert and oriented in NAD. Pt verbalized understanding of discharge instructions. 

## 2017-11-29 DIAGNOSIS — L8995 Pressure ulcer of unspecified site, unstageable: Secondary | ICD-10-CM | POA: Diagnosis not present

## 2017-12-01 ENCOUNTER — Encounter: Payer: Medicare Other | Attending: Physical Medicine & Rehabilitation

## 2017-12-01 ENCOUNTER — Ambulatory Visit (HOSPITAL_BASED_OUTPATIENT_CLINIC_OR_DEPARTMENT_OTHER): Payer: Medicare Other | Admitting: Physical Medicine & Rehabilitation

## 2017-12-01 ENCOUNTER — Encounter: Payer: Self-pay | Admitting: Physical Medicine & Rehabilitation

## 2017-12-01 VITALS — BP 107/70 | HR 68 | Ht 64.0 in | Wt 232.0 lb

## 2017-12-01 DIAGNOSIS — M75102 Unspecified rotator cuff tear or rupture of left shoulder, not specified as traumatic: Secondary | ICD-10-CM | POA: Diagnosis not present

## 2017-12-01 DIAGNOSIS — G8220 Paraplegia, unspecified: Secondary | ICD-10-CM

## 2017-12-01 DIAGNOSIS — M4714 Other spondylosis with myelopathy, thoracic region: Secondary | ICD-10-CM

## 2017-12-01 DIAGNOSIS — M4804 Spinal stenosis, thoracic region: Secondary | ICD-10-CM | POA: Insufficient documentation

## 2017-12-01 DIAGNOSIS — M50322 Other cervical disc degeneration at C5-C6 level: Secondary | ICD-10-CM | POA: Diagnosis not present

## 2017-12-01 DIAGNOSIS — M5136 Other intervertebral disc degeneration, lumbar region: Secondary | ICD-10-CM | POA: Insufficient documentation

## 2017-12-01 NOTE — Patient Instructions (Signed)
Try flattening out the foot of the bed at night  Please take 2 gabapentin at night and 1 in the morning

## 2017-12-01 NOTE — Progress Notes (Signed)
Subjective:    Patient ID: Cassandra Stewart, female    DOB: 01/28/1951, 67 y.o.   MRN: [id number]  HPI CC:  Left sided flank pain Pain worst in am Pain varies in intensity, urination or defecation relieve pain partially.  No exacerbating factors Denies burning with urination , no fevers, no N/V/D Continentt of both bowel and bladder except has some chronic bladder leakage Seen in ED 9/29 UA neg CBC normal CMET normal CT abd and pelvis normal  Weightbearing on LLE worsens pain  Diabetic controlled well  Finished HHPT completes HEP daily Pain Inventory Average Pain 7 Pain Right Now 5 My pain is na  In the last 24 hours, has pain interfered with the following? General activity 7 Relation with others 7 Enjoyment of life 9 What TIME of day is your pain at its worst? morning Sleep (in general) Fair  Pain is worse with: standing Pain improves with: . Relief from Meds: 4  Mobility use a walker ability to climb steps?  no  Function disabled: date disabled . Do you have any goals in this area?  no  Neuro/Psych weakness numbness tingling trouble walking  Prior Studies Any changes since last visit?  no  Physicians involved in your care Any changes since last visit?  no   Family History  Problem Relation Age of Onset  . Diabetes Mother   . Heart disease Mother   . Kidney disease Brother        Two brothers on ESRD  . Amblyopia Neg Hx   . Blindness Neg Hx   . Cataracts Neg Hx   . Glaucoma Neg Hx   . Macular degeneration Neg Hx   . Retinal detachment Neg Hx   . Strabismus Neg Hx   . Retinitis pigmentosa Neg Hx    Social History   Socioeconomic History  . Marital status: Divorced    Spouse name: Not on file  . Number of children: Not on file  . Years of education: Not on file  . Highest education level: Not on file  Occupational History  . Not on file  Social Needs  . Financial resource strain: Not on file  . Food insecurity:    Worry: Not on  file    Inability: Not on file  . Transportation needs:    Medical: Not on file    Non-medical: Not on file  Tobacco Use  . Smoking status: Never Smoker  . Smokeless tobacco: Never Used  Substance and Sexual Activity  . Alcohol use: No  . Drug use: No  . Sexual activity: Not Currently  Lifestyle  . Physical activity:    Days per week: Not on file    Minutes per session: Not on file  . Stress: Not on file  Relationships  . Social connections:    Talks on phone: Not on file    Gets together: Not on file    Attends religious service: Not on file    Active member of club or organization: Not on file    Attends meetings of clubs or organizations: Not on file    Relationship status: Not on file  Other Topics Concern  . Not on file  Social History Narrative  . Not on file   Past Surgical History:  Procedure Laterality Date  . APPENDECTOMY  1969  . CATARACT EXTRACTION     May 40981  . LUMBAR DISC SURGERY    . LUMBAR LAMINECTOMY/DECOMPRESSION MICRODISCECTOMY N/A 07/06/2015   Procedure: Thoracic  ten- eleven laminectomy for spinal canal decompression;  Surgeon: Coletta Memos, MD;  Location: [facility];  Service: Orthopedics;  Laterality: N/A;   Past Medical History:  Diagnosis Date  . Elevated LFTs   . Fatty liver    on [place].   Marland Kitchen Hyperlipemia   . Hypertension   . Uncontrolled type 2 diabetes with neuropathy (HCC)    There were no vitals taken for this visit.  Opioid Risk Score:   Fall Risk Score:  `1  Depression screen PHQ 2/9  Depression screen [facility] 2/9 11/05/2017 10/21/2017 09/09/2017 08/05/2016 08/01/2016 07/29/2016 10/25/2015  Decreased Interest 0 0 0 0 0 0 0  Down, Depressed, Hopeless 3 0 0 0 0 0 0  PHQ - 2 Score 3 0 0 0 0 0 0     Review of Systems  Constitutional: Negative.   HENT: Negative.   Eyes: Negative.   Respiratory: Negative.   Cardiovascular: Negative.   Gastrointestinal: Positive for abdominal pain.  Endocrine: Negative.   Genitourinary: Negative.     Musculoskeletal: Positive for arthralgias, gait problem and myalgias.  Skin: Negative.   Allergic/Immunologic: Negative.   Neurological: Positive for weakness and numbness.  Hematological: Negative.   Psychiatric/Behavioral: Negative.   All other systems reviewed and are negative.      Objective:   Physical Exam  Constitutional: She is oriented to person, place, and time. She appears well-developed and well-nourished.  HENT:  Head: Normocephalic and atraumatic.  Eyes: EOM are normal.  Neck: Normal range of motion.  Cardiovascular: Normal rate and regular rhythm.  Musculoskeletal:  Patient has some tenderness at the left flank area between the 11th rib and the iliac crest.  Negative straight leg raise  Neurological: She is alert and oriented to person, place, and time.  Motor strength is 4/5 bilateral hip flexor knee extensor ankle dorsiflexor 5/5 bilateral deltoid bicep tricep grip  Skin: Skin is warm and dry.  Psychiatric: She has a normal mood and affect.  Nursing note and vitals reviewed.         Assessment & Plan:  #1.  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  We discussed increasing gabapentin 600 nightly 300 and a.m. We discussed bed positioning.  She recently was seen at a hospital bed and that she is been elevating her legs.  She will try flattening out the bed and see if this helps with some of her symptoms.  Symptom onset seems about the same time as she got her hospital bed.

## 2017-12-09 ENCOUNTER — Encounter (INDEPENDENT_AMBULATORY_CARE_PROVIDER_SITE_OTHER): Payer: Medicare Other | Admitting: Ophthalmology

## 2017-12-16 NOTE — Progress Notes (Signed)
[facility] Note  12/17/2017     CHIEF COMPLAINT Patient presents for Retina Follow Up   HISTORY OF PRESENT ILLNESS: Cassandra Stewart is a 67 y.o. female who presents to the clinic today for:   HPI    Retina Follow Up    Patient presents with  Diabetic Retinopathy.  In both eyes.  This started 10 months ago.  Severity is mild.  Since onset it is stable.  I, the attending physician,  performed the HPI with the patient and updated documentation appropriately.          Comments    6  Mos F/U NPDR OU .Patient states her vision "is good" denies flashes ,floaters and ocular pain. Pt states her eyes are dry when she wakes in the am.Bs 76 this am,ADenies gtt's       Last edited by Bernarda Caffey, MD on 12/17/2017  2:05 PM. (History)    Pt states she feels like her VA is a little blurry and in the morning they are very blurry, she states her last A1C was 6.1, down from 9.4, pt states she is eating a more plant based diet,   Referring physician: Girtha Rm, NP-C [place], [place] [address]  HISTORICAL INFORMATION:   Selected notes from the [facility] Referred by V. Raenette Rover, NP-C for DM exam LEE-  Ocular Hx- pseudophakia OU (2016, Dr. Candiss Norse at [facility]) [place]- DM2 on insulin, HTN,     CURRENT MEDICATIONS: No current outpatient medications on file. (Ophthalmic Drugs)   No current facility-administered medications for this visit.  (Ophthalmic Drugs)   Current Outpatient Medications (Other)  Medication Sig  . ACCU-CHEK SOFTCLIX LANCETS lancets Test 3 times daily. Pt uses accu-chek softclix lancing device  . acetaminophen (ACETAMINOPHEN 8 HOUR) 650 MG CR tablet Take 650 mg by mouth every 8 (eight) hours as needed for pain.  Marland Kitchen albuterol (PROVENTIL HFA) 108 (90 Base) MCG/ACT inhaler Inhale into the lungs.  Marland Kitchen amLODipine (NORVASC) 10 MG tablet TAKE 1 TABLET BY MOUTH EVERY MORNING  . aspirin EC 81 MG tablet Take 81 mg by  mouth every evening.  . Blood Glucose Monitoring Suppl (ACCU-CHEK AVIVA PLUS) w/Device KIT Use as advised  . Cholecalciferol (VITAMIN D PO) Take 1 tablet by mouth daily.  Marland Kitchen gabapentin (NEURONTIN) 300 MG capsule TAKE 1 CAPSULE BY MOUTH THREE TIMES DAILY  . glucose blood (ACCU-CHEK AVIVA PLUS) test strip Use as instructed 4x a day  . glucose blood test strip Test 3 times daily. Dx E11.9. Pt uses accu-chek aviva plus meter  . hydrochlorothiazide (MICROZIDE) 12.5 MG capsule TAKE 1 CAPSULE BY MOUTH DAILY  . insulin lispro (HUMALOG KWIKPEN) 100 UNIT/ML KiwkPen INJECT 8-10 UNITS UNDER THE SKIN THREE TIMES DAILY  . Insulin Pen Needle (B-D ULTRAFINE III SHORT PEN) 31G X 8 MM MISC 3 (three) times daily before meals.  . Insulin Pen Needle (CAREFINE PEN NEEDLES) 32G X 4 MM MISC Use 4x a day  . lisinopril (PRINIVIL,ZESTRIL) 10 MG tablet TAKE 1 TABLET BY MOUTH EVERY MORNING  . lovastatin (MEVACOR) 40 MG tablet TAKE 1 TABLET BY MOUTH AT BEDTIME  . metFORMIN (GLUCOPHAGE-XR) 500 MG 24 hr tablet Take 1 tablet (500 mg total) by mouth 2 (two) times daily with a meal. (Patient taking differently: Take 500 mg by mouth daily. )  . polyethylene glycol (MIRALAX / GLYCOLAX) packet Take 17 g by mouth daily as needed for mild constipation.  Nelva Nay  SOLOSTAR 300 UNIT/ML SOPN Inject 30 Units into the skin at bedtime.  . TRULICITY 6.37 CH/8.8FO SOPN INJECT 0.75 MG UNDER THE SKIN EVERY WEEK   No current facility-administered medications for this visit.  (Other)      REVIEW OF SYSTEMS: ROS    Positive for: Endocrine, Eyes   Negative for: Constitutional, Gastrointestinal, Neurological, Skin, Genitourinary, Musculoskeletal, HENT, Cardiovascular, Respiratory, Psychiatric, Allergic/Imm, Heme/Lymph   Last edited by Zenovia Jordan, LPN on 27/74/1287  8:67 PM. (History)       ALLERGIES Allergies  Allergen Reactions  . Other     PT PREFERS TO NOT HAVE ANY NARCOTIC MEDICATIONS  . Chlorhexidine Rash    Burning after  using CHG wipes-used for surgery  . Oxycodone Hcl     Other reaction(s): Hallucination Marked hallucinations and palpitations following dose of 72m on 04/30/2015     PAST MEDICAL HISTORY Past Medical History:  Diagnosis Date  . Elevated LFTs   . Fatty liver    on [place]   .Marland KitchenHyperlipemia   . Hypertension   . Uncontrolled type 2 diabetes with neuropathy ([facility]    Past Surgical History:  Procedure Laterality Date  . APPENDECTOMY  1969  . CATARACT EXTRACTION     May 21016  . LUMBAR DISC SURGERY    . LUMBAR LAMINECTOMY/DECOMPRESSION MICRODISCECTOMY N/A 07/06/2015   Procedure: Thoracic ten- eleven laminectomy for spinal canal decompression;  Surgeon: KAshok Pall MD;  [facility];  Service: Orthopedics;  Laterality: N/A;    FAMILY HISTORY Family History  Problem Relation Age of Onset  . Diabetes Mother   . Heart disease Mother   . Kidney disease Brother        Two brothers on ESRD  . Amblyopia Neg Hx   . Blindness Neg Hx   . Cataracts Neg Hx   . Glaucoma Neg Hx   . Macular degeneration Neg Hx   . Retinal detachment Neg Hx   . Strabismus Neg Hx   . Retinitis pigmentosa Neg Hx     SOCIAL HISTORY Social History   Tobacco Use  . Smoking status: Never Smoker  . Smokeless tobacco: Never Used  Substance Use Topics  . Alcohol use: No  . Drug use: No         OPHTHALMIC EXAM:  Base Eye Exam    Visual Acuity (Snellen - Linear)      Right Left   Dist [place] 20/20 20/20 -1   Dist ph [place] NI 20/20       Tonometry (Tonopen, 1:22 PM)      Right Left   Pressure 14 14       Pupils      Dark Light Shape React APD   Right 4 3 Round Brisk None   Left 4 3 Round Brisk None       Visual Fields (Counting fingers)      Left Right    Full Full       Extraocular Movement      Right Left    Full, Ortho Full, Ortho       Neuro/Psych    Oriented x3:  Yes   Mood/Affect:  Normal       Dilation    Both eyes:  1.0% Mydriacyl, 2.5% Phenylephrine @ 1:22 PM         Slit Lamp and Fundus Exam    External Exam      Right Left   External Normal Normal  Slit Lamp Exam      Right Left   Lids/Lashes Dermatochalasis - upper lid Dermatochalasis - upper lid   Conjunctiva/Sclera White and quiet White and quiet   Cornea 1+ Punctate epithelial erosions, mild arcus; Well healed temporal cataract wounds 1-2+ central Punctate epithelial erosions, Arcus, Well healed temporal cataract wounds   Anterior Chamber Deep and quiet Deep and quiet   Iris Round and well dilated, No NVI Round and dilated, No NVI   Lens PC IOL in good postion, inferior 1+ Posterior capsular opacification PC IOL in good postion, Inferior 1+ Posterior capsular opacification   Vitreous Vitreous syneresis, Posterior vitreous detachment Vitreous syneresis, Posterior vitreous detachment       Fundus Exam      Right Left   Disc Pink and Sharp Mildly Tilted disc, Pink and Sharp   C/D Ratio 0.55 0.6   Macula Good foveal reflex, mild Microaneurysms and cystic changes nasal fovea Good foveal reflex, Microaneurysms inferior fovea, no edema   Vessels Mild Vascular attenuation Mild venous beading, AV crossing changes, Vascular attenuation   Periphery Attached; rare scattered MA Attached; rare scattered MA          IMAGING AND PROCEDURES  Imaging and Procedures for 06/09/17  OCT, Retina - OU - Both Eyes       Right Eye Quality was good. Central Foveal Thickness: 270. Progression has worsened. Findings include normal foveal contour, no SRF, intraretinal fluid (Mild cystic changes nasal fovea).   Left Eye Quality was borderline. Central Foveal Thickness: 272. Progression has been stable. Findings include normal foveal contour, no SRF, no IRF.   Notes *Images captured and stored on drive  Diagnosis / Impression:  OD: NFP, No SRF; tr cystic changes--new OS: NFP; no IRF/SRF  Clinical management:  See below  Abbreviations: NFP - Normal foveal profile. CME - cystoid macular edema. PED  - pigment epithelial detachment. IRF - intraretinal fluid. SRF - subretinal fluid. EZ - ellipsoid zone. ERM - epiretinal membrane. ORA - outer retinal atrophy. ORT - outer retinal tubulation. SRHM - subretinal hyper-reflective material                  ASSESSMENT/PLAN:    ICD-10-CM   1. 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 Mild nonproliferative diabetic retinopathy of both eyes without macular edema associated with type 2 diabetes mellitus ([place]) O16.0737   3. Retinal edema H35.81 OCT, Retina - OU - Both Eyes  4. Essential hypertension I10   5. Hypertensive retinopathy of both eyes H35.033   6. Posterior vitreous detachment of both eyes H43.813   7. Pseudophakia of both eyes Z96.1     1-3. DM2 w/ Mild non-proliferative diabetic retinopathy, OU - The incidence, risk factors for progression, natural history and treatment options for diabetic retinopathy were discussed with patient.   - The need for close monitoring of blood glucose, blood pressure, and serum lipids, avoiding cigarette or any type of tobacco, and the need for long term follow up was also discussed with patient. - exam with scattered MAs and few CWSs OU  - OCT without clinically significant diabetic macular edema OU - VA remains 20/20 OU - f/u in 6 mos -- repeat DFE, OCT  4,5. Hypertensive retinopathy OU - mild - discussed importance of tight BP control - monitor  6. PVD / vitreous syneresis  asymptomatic  Discussed findings and prognosis  No RT or RD on 360 peripheral exam  Reviewed s/s of RT/RD  Strict return precautions for any such RT/RD signs/symptoms  7. Pseudophakia OU  - s/p CE/IOL in 2016, [facility], Dr. Candiss Norse  - beautiful surgeries, doing well  - monitor  Ophthalmic Meds Ordered this visit:  No orders of the defined types were placed in this encounter.      Return in about 6 months (around 06/18/2018) for F/U, NPDR OU, DFE, OCT.  There are no Patient  Instructions on file for this visit.   Explained the diagnoses, plan, and follow up with the patient and they expressed understanding.  Patient expressed understanding of the importance of proper follow up care.   This document serves as a record of services personally performed by Gardiner Sleeper, MD, PhD. It was created on their behalf by Ernest Mallick, OA, an ophthalmic assistant. The creation of this record is the provider's dictation and/or activities during the visit.    Electronically signed by: Ernest Mallick, OA  10.22.19 10:40 PM    Gardiner Sleeper, M.D., Ph.D. Diseases & Surgery of the Retina and Vitreous [facility]   I have reviewed the above documentation for accuracy and completeness, and I agree with the above. Gardiner Sleeper, M.D., Ph.D. 12/18/17 10:42 PM    Abbreviations: M myopia (nearsighted); A astigmatism; H hyperopia (farsighted); P presbyopia; Mrx spectacle prescription;  CTL contact lenses; OD right eye; OS left eye; OU both eyes  XT exotropia; ET esotropia; PEK punctate epithelial keratitis; PEE punctate epithelial erosions; DES dry eye syndrome; MGD meibomian gland dysfunction; ATs artificial tears; PFAT's preservative free artificial tears; [place] nuclear sclerotic cataract; PSC posterior subcapsular cataract; ERM epi-retinal membrane; PVD posterior vitreous detachment; RD retinal detachment; DM diabetes mellitus; DR diabetic retinopathy; NPDR non-proliferative diabetic retinopathy; PDR proliferative diabetic retinopathy; CSME clinically significant macular edema; DME diabetic macular edema; dbh dot blot hemorrhages; CWS cotton wool spot; POAG primary open angle glaucoma; C/D cup-to-disc ratio; HVF humphrey visual field; GVF goldmann visual field; OCT optical coherence tomography; IOP intraocular pressure; BRVO Branch retinal vein occlusion; CRVO central retinal vein occlusion; CRAO central retinal artery occlusion; BRAO branch retinal artery  occlusion; RT retinal tear; SB scleral buckle; PPV pars plana vitrectomy; VH Vitreous hemorrhage; PRP panretinal laser photocoagulation; IVK intravitreal kenalog; VMT vitreomacular traction; MH Macular hole;  NVD neovascularization of the disc; NVE neovascularization elsewhere; AREDS age related eye disease study; ARMD age related macular degeneration; POAG primary open angle glaucoma; EBMD epithelial/anterior basement membrane dystrophy; ACIOL anterior chamber intraocular lens; IOL intraocular lens; PCIOL posterior chamber intraocular lens; Phaco/IOL phacoemulsification with intraocular lens placement; [place] photorefractive keratectomy; LASIK laser assisted in situ keratomileusis; HTN hypertension; DM diabetes mellitus; COPD chronic obstructive pulmonary disease

## 2017-12-17 ENCOUNTER — Ambulatory Visit (INDEPENDENT_AMBULATORY_CARE_PROVIDER_SITE_OTHER): Payer: Medicare Other | Admitting: Ophthalmology

## 2017-12-17 ENCOUNTER — Encounter (INDEPENDENT_AMBULATORY_CARE_PROVIDER_SITE_OTHER): Payer: Self-pay | Admitting: Ophthalmology

## 2017-12-17 DIAGNOSIS — I10 Essential (primary) hypertension: Secondary | ICD-10-CM

## 2017-12-17 DIAGNOSIS — E113293 Type 2 diabetes mellitus with mild nonproliferative diabetic retinopathy without macular edema, bilateral: Secondary | ICD-10-CM

## 2017-12-17 DIAGNOSIS — H35049 Retinal micro-aneurysms, unspecified, unspecified eye: Secondary | ICD-10-CM

## 2017-12-17 DIAGNOSIS — H3581 Retinal edema: Secondary | ICD-10-CM

## 2017-12-17 DIAGNOSIS — H43813 Vitreous degeneration, bilateral: Secondary | ICD-10-CM

## 2017-12-17 DIAGNOSIS — E11319 Type 2 diabetes mellitus with unspecified diabetic retinopathy without macular edema: Secondary | ICD-10-CM | POA: Diagnosis not present

## 2017-12-17 DIAGNOSIS — Z961 Presence of intraocular lens: Secondary | ICD-10-CM

## 2017-12-17 DIAGNOSIS — H35033 Hypertensive retinopathy, bilateral: Secondary | ICD-10-CM

## 2017-12-17 LAB — HM DIABETES EYE EXAM

## 2017-12-18 ENCOUNTER — Encounter (INDEPENDENT_AMBULATORY_CARE_PROVIDER_SITE_OTHER): Payer: Self-pay | Admitting: Ophthalmology

## 2017-12-29 ENCOUNTER — Other Ambulatory Visit: Payer: Self-pay | Admitting: Internal Medicine

## 2017-12-29 ENCOUNTER — Other Ambulatory Visit: Payer: Self-pay

## 2017-12-29 ENCOUNTER — Other Ambulatory Visit: Payer: Self-pay | Admitting: Family Medicine

## 2017-12-29 NOTE — Telephone Encounter (Signed)
Is this okay to refill?. Last refilled in epic by you in 11/2016

## 2017-12-29 NOTE — Patient Outreach (Signed)
[facility]) Care Management  12/29/2017  Kechia Yahnke [birth year] [id number]   Medication Adherence call to Mrs. Cassandra Stewart spoke with patient she is still taking both medication and prefers to order it her self patient is due on Lisinopril 10 mg and Lovastatin 40 mg. Cassandra Stewart is showing past due under [facility] Ins.   Lillia Abed CPhT Pharmacy Technician [facility] Care Management Direct Dial [phone]  Fax [phone] [email or web address]

## 2017-12-30 ENCOUNTER — Other Ambulatory Visit: Payer: Self-pay | Admitting: Internal Medicine

## 2017-12-30 DIAGNOSIS — L8995 Pressure ulcer of unspecified site, unstageable: Secondary | ICD-10-CM | POA: Diagnosis not present

## 2017-12-31 ENCOUNTER — Telehealth: Payer: Self-pay | Admitting: Internal Medicine

## 2017-12-31 NOTE — Telephone Encounter (Signed)
Pt called regarding her refills on all her meds.  We refilled 5 of her meds at our office but she is also out of her Toujeo, Trulicity and Humalog which are filled by your office and it looks like she has refills but they are all send as "no print"  so they are not at the pharmacy.  Pt states she needs as soon as possible.  Thanks

## 2018-01-01 ENCOUNTER — Other Ambulatory Visit: Payer: Self-pay

## 2018-01-01 MED ORDER — DULAGLUTIDE 0.75 MG/0.5ML ~~LOC~~ SOAJ: 0.7500 mg | SUBCUTANEOUS | 1 refills | Status: DC

## 2018-01-01 MED ORDER — TOUJEO SOLOSTAR 300 UNIT/ML ~~LOC~~ SOPN: 30.0000 [IU] | PEN_INJECTOR | Freq: Every day | SUBCUTANEOUS | 3 refills | Status: DC

## 2018-01-01 MED ORDER — INSULIN LISPRO (1 UNIT DIAL) 100 UNIT/ML (KWIKPEN): 8.0000 [IU] | PEN_INJECTOR | Freq: Three times a day (TID) | SUBCUTANEOUS | 11 refills | Status: DC

## 2018-01-01 NOTE — Telephone Encounter (Signed)
Medication refills have been changed to "normal" and sent electronically to pharmacy

## 2018-01-01 NOTE — Telephone Encounter (Signed)
Pt called regarding her refills on all her meds.  We refilled 5 of her meds at our office but she is also out of her Toujeo, Trulicity and Humalog which are filled by your office and it looks like she has refills but they are all send as "no print"  so they are not at the pharmacy.  Pt states she needs as soon as possible.  Thanks   Medication refills have been changed to "normal" and sent electronically rather than by printing.

## 2018-01-06 ENCOUNTER — Ambulatory Visit
Admission: RE | Admit: 2018-01-06 | Discharge: 2018-01-06 | Disposition: A | Discharge status: Home / Self Care | Payer: Medicare Other | Source: Ambulatory Visit | Attending: Family Medicine | Admitting: Family Medicine

## 2018-01-06 DIAGNOSIS — Z1239 Encounter for other screening for malignant neoplasm of breast: Secondary | ICD-10-CM

## 2018-01-06 DIAGNOSIS — Z780 Asymptomatic menopausal state: Secondary | ICD-10-CM | POA: Diagnosis not present

## 2018-01-06 DIAGNOSIS — Z13820 Encounter for screening for osteoporosis: Secondary | ICD-10-CM | POA: Diagnosis not present

## 2018-01-06 DIAGNOSIS — Z1231 Encounter for screening mammogram for malignant neoplasm of breast: Secondary | ICD-10-CM | POA: Diagnosis not present

## 2018-01-06 DIAGNOSIS — E2839 Other primary ovarian failure: Secondary | ICD-10-CM

## 2018-01-08 ENCOUNTER — Other Ambulatory Visit: Payer: Self-pay | Admitting: Family Medicine

## 2018-01-08 DIAGNOSIS — R928 Other abnormal and inconclusive findings on diagnostic imaging of breast: Secondary | ICD-10-CM

## 2018-01-09 ENCOUNTER — Encounter: Payer: Self-pay | Admitting: Family Medicine

## 2018-01-13 ENCOUNTER — Ambulatory Visit
Admission: RE | Admit: 2018-01-13 | Discharge: 2018-01-13 | Disposition: A | Discharge status: Home / Self Care | Payer: Medicare Other | Source: Ambulatory Visit | Attending: Family Medicine | Admitting: Family Medicine

## 2018-01-13 ENCOUNTER — Other Ambulatory Visit: Payer: Self-pay | Admitting: Family Medicine

## 2018-01-13 DIAGNOSIS — R928 Other abnormal and inconclusive findings on diagnostic imaging of breast: Secondary | ICD-10-CM

## 2018-01-13 DIAGNOSIS — R921 Mammographic calcification found on diagnostic imaging of breast: Secondary | ICD-10-CM

## 2018-01-19 ENCOUNTER — Ambulatory Visit (HOSPITAL_BASED_OUTPATIENT_CLINIC_OR_DEPARTMENT_OTHER): Payer: Medicare Other | Admitting: Physical Medicine & Rehabilitation

## 2018-01-19 ENCOUNTER — Encounter: Payer: Medicare Other | Attending: Physical Medicine & Rehabilitation

## 2018-01-19 ENCOUNTER — Encounter: Payer: Self-pay | Admitting: Physical Medicine & Rehabilitation

## 2018-01-19 ENCOUNTER — Ambulatory Visit: Payer: Medicare Other | Admitting: Physical Medicine & Rehabilitation

## 2018-01-19 VITALS — BP 126/72 | HR 69 | Ht 64.5 in | Wt 232.0 lb

## 2018-01-19 DIAGNOSIS — M4714 Other spondylosis with myelopathy, thoracic region: Secondary | ICD-10-CM | POA: Insufficient documentation

## 2018-01-19 DIAGNOSIS — M4804 Spinal stenosis, thoracic region: Secondary | ICD-10-CM | POA: Diagnosis not present

## 2018-01-19 DIAGNOSIS — G8220 Paraplegia, unspecified: Secondary | ICD-10-CM | POA: Insufficient documentation

## 2018-01-19 DIAGNOSIS — M5136 Other intervertebral disc degeneration, lumbar region: Secondary | ICD-10-CM | POA: Diagnosis not present

## 2018-01-19 DIAGNOSIS — S39011A Strain of muscle, fascia and tendon of abdomen, initial encounter: Secondary | ICD-10-CM

## 2018-01-19 DIAGNOSIS — M50322 Other cervical disc degeneration at C5-C6 level: Secondary | ICD-10-CM | POA: Insufficient documentation

## 2018-01-19 NOTE — Patient Instructions (Signed)
Your left sided abdominal pain is probably muscular and related to reaching toward the Right side frequently  Non medication pain relief  Try ice alternating with heating pad for 20 minutes every 2 hours as needed  Try TENs unit that can be puchased in a pharmacy for about 40.00, brands include Aleve or Icy Hot  Try various muscle cremes  Aspercreme or others that contain trolamine- this is anti inflammatory  Capsaicin creme which reduces Pain substance P  Lidocaine containing creme which has a numbing medicine  Methol and camphor containing creme which has a cooling effect

## 2018-01-19 NOTE — Progress Notes (Signed)
Subjective:    Patient ID: Cassandra Stewart, female    DOB: January 25, 1951, 67 y.o.   MRN: [id number] 3 year hx of progressive BLE pain, which was first noted when going up and down stairs Initially sen at [facility] admitted without surgery for a few days and transferred to [facility].  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 Underwent decompression 07/06/15, some improvement in pain Post op was working with therapy , walking with walker LLE pain- "bone is aching" Last MRI of Lumbar spine 11/08/15, didn't have any evidence of nerve root impingement  Hx of prior lumbar disc surgery ~1989  No bowel or bladder dysfunction, occ incont due to immobility Tylenol arthritis 2 tablets per day In motorized scooter  Mod I dressing and bathing Using walker household distance Lives alone, no steps in home  Last PT through home health in July  2018 HPI CC:  LLQ pain Gabapentin increased to 600mg  qhs and 300mg  daily  Using hospital bed  No skin issues Using manual WC at home  Shoulder doing wonderful  LLQ pain- worse in the am, subsides with mild exercise but increased with certain activity such as mopping and washing dishes Pain not affected by eating or BMs, Voids normally, no LLQ with urination Pain Inventory Average Pain 6 Pain Right Now 5 My pain is aching  In the last 24 hours, has pain interfered with the following? General activity 4 Relation with others 4 Enjoyment of life 2 What TIME of day is your pain at its worst? morning Sleep (in general) Fair  Pain is worse with: some activites Pain improves with: rest and medication Relief from Meds: 7  Mobility use a walker ability to climb steps?  no do you drive?  no  Function retired  Neuro/Psych numbness tingling  Prior Studies Any changes since last visit?  no  Physicians involved in your  care Any changes since last visit?  no   Family History  Problem Relation Age of Onset  . Diabetes Mother   . Heart disease Mother   . Kidney disease Brother        Two brothers on ESRD  . Amblyopia Neg Hx   . Blindness Neg Hx   . Cataracts Neg Hx   . Glaucoma Neg Hx   . Macular degeneration Neg Hx   . Retinal detachment Neg Hx   . Strabismus Neg Hx   . Retinitis pigmentosa Neg Hx    Social History   Socioeconomic History  . Marital status: Divorced    Spouse name: Not on file  . Number of children: Not on file  . Years of education: Not on file  . Highest education level: Not on file  Occupational History  . Not on file  Social Needs  . Financial resource strain: Not on file  . Food insecurity:    Worry: Not on file    Inability: Not on file  . Transportation needs:    Medical: Not on file    Non-medical: Not on file  Tobacco Use  . Smoking status: Never Smoker  . Smokeless tobacco: Never Used  Substance and Sexual Activity  . Alcohol use: No  . Drug use: No  . Sexual activity: Not Currently  Lifestyle  . Physical activity:    Days per week: Not on file    Minutes per session: Not on file  .  Stress: Not on file  Relationships  . Social connections:    Talks on phone: Not on file    Gets together: Not on file    Attends religious service: Not on file    Active member of club or organization: Not on file    Attends meetings of clubs or organizations: Not on file    Relationship status: Not on file  Other Topics Concern  . Not on file  Social History Narrative  . Not on file   Past Surgical History:  Procedure Laterality Date  . APPENDECTOMY  1969  . BREAST BIOPSY Right   . CATARACT EXTRACTION     May 16109  . LUMBAR DISC SURGERY    . LUMBAR LAMINECTOMY/DECOMPRESSION MICRODISCECTOMY N/A 07/06/2015   Procedure: Thoracic ten- eleven laminectomy for spinal canal decompression;  Surgeon: Coletta Memos, MD;  Location: [facility];  Service: Orthopedics;   Laterality: N/A;   Past Medical History:  Diagnosis Date  . Elevated LFTs   . Fatty liver    on [place].   Marland Kitchen Hyperlipemia   . Hypertension   . Uncontrolled type 2 diabetes with neuropathy (HCC)    There were no vitals taken for this visit.  Opioid Risk Score:   Fall Risk Score:  `1  Depression screen PHQ 2/9  Depression screen [facility] 2/9 11/05/2017 10/21/2017 09/09/2017 08/05/2016 08/01/2016 07/29/2016 10/25/2015  Decreased Interest 0 0 0 0 0 0 0  Down, Depressed, Hopeless 3 0 0 0 0 0 0  PHQ - 2 Score 3 0 0 0 0 0 0     Review of Systems  Constitutional: Negative.   HENT: Negative.   Eyes: Negative.   Respiratory: Negative.   Cardiovascular: Negative.   Gastrointestinal: Positive for abdominal pain.  Endocrine: Negative.   Genitourinary: Negative.   Musculoskeletal: Positive for arthralgias, back pain, gait problem and myalgias.  Skin: Negative.   Allergic/Immunologic: Negative.   Neurological: Positive for numbness.  Hematological: Negative.   Psychiatric/Behavioral: Negative.   All other systems reviewed and are negative.      Objective:   Physical Exam  Constitutional: She is oriented to person, place, and time. She appears well-developed and well-nourished.  HENT:  Head: Normocephalic and atraumatic.  Eyes: Pupils are equal, round, and reactive to light. EOM are normal.  Neck: Normal range of motion.  Neurological: She is alert and oriented to person, place, and time.  Motor strength is 4- bilateral knee extensors 3- bilateral hip extensors 4- bilateral ankle dorsiflexors    Skin: Skin is warm and dry.  Psychiatric: She has a normal mood and affect.  Nursing note and vitals reviewed.  Sensation reported as equal bilateral lower limbs in the thigh and leg area. No pain with hip range of motion bilaterally no tenderness to greater trochanters bilaterally  Tenderness from the lower border of the lower ribs on the left side no tenderness in the lower abdominal area with  reaching toward the right she has pain that goes from her rib cage down to her iliac crest No abdominal rigidity Positive bowel sounds     Assessment & Plan:  1.  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  Abdominal pain appears to be musculoskeletal She does most of her instrumental ADLs from a wheelchair and is reaching toward the right side on a repetitive basis.  We discussed that these are her abdominal stabilizer muscles.  She is likely overusing them on the left side when reaching toward the right. We discussed that  she should start positioning her wheelchair so she is reaching toward the left side.  She thinks it may be somewhat awkward in her kitchen but she would try this. We also discussed using heat as well as a counter irritant cream. She will see me back in 3 months

## 2018-01-29 DIAGNOSIS — L8995 Pressure ulcer of unspecified site, unstageable: Secondary | ICD-10-CM | POA: Diagnosis not present

## 2018-03-01 DIAGNOSIS — L8995 Pressure ulcer of unspecified site, unstageable: Secondary | ICD-10-CM | POA: Diagnosis not present

## 2018-03-16 ENCOUNTER — Ambulatory Visit (INDEPENDENT_AMBULATORY_CARE_PROVIDER_SITE_OTHER): Payer: Medicare Other | Admitting: Internal Medicine

## 2018-03-16 ENCOUNTER — Encounter: Payer: Self-pay | Admitting: Internal Medicine

## 2018-03-16 VITALS — BP 126/70 | HR 76

## 2018-03-16 DIAGNOSIS — E782 Mixed hyperlipidemia: Secondary | ICD-10-CM

## 2018-03-16 DIAGNOSIS — E538 Deficiency of other specified B group vitamins: Secondary | ICD-10-CM

## 2018-03-16 DIAGNOSIS — E1140 Type 2 diabetes mellitus with diabetic neuropathy, unspecified: Secondary | ICD-10-CM

## 2018-03-16 DIAGNOSIS — E1165 Type 2 diabetes mellitus with hyperglycemia: Secondary | ICD-10-CM | POA: Diagnosis not present

## 2018-03-16 DIAGNOSIS — IMO0002 Reserved for concepts with insufficient information to code with codable children: Secondary | ICD-10-CM

## 2018-03-16 LAB — POCT GLYCOSYLATED HEMOGLOBIN (HGB A1C): HEMOGLOBIN A1C: 6.8 % — AB (ref 4.0–5.6)

## 2018-03-16 LAB — VITAMIN B12: VITAMIN B 12: 261 pg/mL (ref 211–911)

## 2018-03-16 MED ORDER — INSULIN LISPRO (1 UNIT DIAL) 100 UNIT/ML (KWIKPEN): 5.0000 [IU] | PEN_INJECTOR | Freq: Three times a day (TID) | SUBCUTANEOUS | 11 refills | Status: DC

## 2018-03-16 MED ORDER — DULAGLUTIDE 1.5 MG/0.5ML ~~LOC~~ SOAJ: 1.5000 mg | SUBCUTANEOUS | 11 refills | Status: DC

## 2018-03-16 MED ORDER — LOVASTATIN 40 MG PO TABS: 40.0000 mg | ORAL_TABLET | Freq: Every day | ORAL | 3 refills | Status: DC

## 2018-03-16 NOTE — Patient Instructions (Addendum)
Please continue: - Metformin ER 500 mg 1-2x a day with meals. - Toujeo 30 units at night  Please increase: - Trulicity to 1.5 mg weekly  Please decrease: - Humalog to 5-7 units before the meals.  If the sugars remain as good as now, you can stop.  Please continue B12 5000 mcg daily.  Please return in 4 months with your sugar log.

## 2018-03-16 NOTE — Progress Notes (Signed)
-Patient ID: Cassandra Stewart, female   DOB: 1950-11-11, 68 y.o.   MRN: [id number]   HPI: Cassandra Stewart is a 68 y.o.-year-old female, returning for follow-up for DM2, dx in 1992, insulin-dependent since 2006, uncontrolled, with complications (CKD stage 3, PN, DR).  Last visit 4 months ago.  Her younger sister just died from Pancreatic CA >> she is very stressed. She had to travel to [place] >> did not recover from the long trip yet.   Last hemoglobin A1c was: Lab Results  Component Value Date   HGBA1C 6.1 (A) 11/12/2017   HGBA1C 9.0 07/11/2017   HGBA1C 9.0 04/14/2017   HGBA1C 8.7% 10/24/2016   HGBA1C 9.6% 07/25/2016   HGBA1C 10.5% 03/04/2016   HGBA1C 8.1% 10/25/2015   HGBA1C 9.7 (H) 07/04/2015   Pt was on a regimen of: - Metformin 500 mg 1x a day with dinner.  She had diarrhea with a higher dose. - Toujeo 45 units in am - Humalog 18 units 2-3x a day, before meals Tried: Actos, Lantus.  Now on: - Metformin ER 500 mg 1-2x a day with meals. - Trulicity 2.62 mg weekly - started 10/2017 - Toujeo 45 >> 38 >> 30 units at night - Humalog: 8-10 >> 9-12 units before meals (2x a day)  She checks sugars 1-2 times a day: - am: 146-210 >> 140-183 >> 63, 74-91 >> 90-103 - 2h after b'fast: n/c - before lunch: n/c >> 80-110 >> n/c - 2h after lunch: n/c - before dinner: n/c >>  80-110 >> 90-100 - 2h after dinner: n/c - bedtime: n/c - nighttime: n/c Lowest sugar was 60 >> 90; she has hypoglycemia awareness in the 80s. Highest sugar was 300s >> 183 >> 148 >> 123.  Glucometer: Freestyle  Pt's meals are: - Breakfast/brunch: egg, bacon, toast - Lunch: snack mostly (no Humalog) - Dinner: chicken/fish + vegetables - Snacks: no She gets 1 meal a day from [employer].  -+ Stage III CKD; last BUN/creatinine:  Lab Results  Component Value Date   BUN 21 11/23/2017   BUN 20 11/05/2017   CREATININE 1.10 (H) 11/23/2017   CREATININE 1.06 (H) 11/05/2017   Lab Results  Component  Value Date   GFRAA 58 (L) 11/23/2017   GFRAA 62 11/05/2017   GFRAA 57 (L) 10/24/2016   GFRAA 57 (L) 06/30/2016   GFRAA 71 03/04/2016  On lisinopril 10 -+ HL; last set of lipids: Lab Results  Component Value Date   CHOL 147 11/05/2017   HDL 59 11/05/2017   LDLCALC 61 11/05/2017   TRIG 133 11/05/2017   CHOLHDL 2.5 11/05/2017  On lovastatin 40. - last eye exam was in 02/2017: + DR.  She has history of cataract surgery OU. -+ Numbness and tingling in her feet.  On Neurontin 300 mg 3X a day. On ASA 81.  Low vitamin B12:  Reviewed her B12 levels: Lab Results  Component Value Date   VITAMINB12 300 04/14/2017   VITAMINB12 478 10/25/2015  05/01/2016: Vit B12 248.  She continues on 5000 mcg B12 daily.  She continues to be in a wheelchair as her left leg is very weak.  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  She had surgery in 2016 but strength did not improve.  ROS: Constitutional: no weight gain/no weight loss, + fatigue, no subjective hyperthermia, no subjective hypothermia Eyes: no blurry vision, no xerophthalmia ENT: no sore throat, no nodules palpated in neck, no dysphagia, no odynophagia, no hoarseness Cardiovascular: no  CP/no SOB/+ palpitations/+ leg swelling Respiratory: no cough/no SOB/no wheezing Gastrointestinal: no N/no V/no D/no C/no acid reflux Musculoskeletal: no muscle aches/+ joint aches Skin: no rashes, no hair loss Neurological: no tremors/no numbness/no tingling/no dizziness  I reviewed pt's medications, allergies, PMH, social hx, family hx, and changes were documented in the history of present illness. Otherwise, unchanged from my initial visit note.  Past Medical History:  Diagnosis Date  . Elevated LFTs   . Fatty liver    on [place].   Marland Kitchen Hyperlipemia   . Hypertension   . Uncontrolled type 2 diabetes with neuropathy [facility])    Past Surgical History:  Procedure Laterality Date  . APPENDECTOMY  1969  . BREAST BIOPSY Right   .  CATARACT EXTRACTION     May 21016  . LUMBAR DISC SURGERY    . LUMBAR LAMINECTOMY/DECOMPRESSION MICRODISCECTOMY N/A 07/06/2015   Procedure: Thoracic ten- eleven laminectomy for spinal canal decompression;  Surgeon: Ashok Pall, MD;  Location: [facility];  Service: Orthopedics;  Laterality: N/A;   Social History   Social History  . Marital status: Divorced    Spouse name: N/A  . Number of children: 0   Occupational History  . None   Social History Main Topics  . Smoking status: Never Smoker  . Smokeless tobacco: Never Used  . Alcohol use No  . Drug use: No   Current Outpatient Medications on File Prior to Visit  Medication Sig Dispense Refill  . ACCU-CHEK SOFTCLIX LANCETS lancets Test 3 times daily. Pt uses accu-chek softclix lancing device 200 each 3  . acetaminophen (ACETAMINOPHEN 8 HOUR) 650 MG CR tablet Take 650 mg by mouth every 8 (eight) hours as needed for pain.    Marland Kitchen albuterol (PROVENTIL HFA) 108 (90 Base) MCG/ACT inhaler Inhale into the lungs.    Marland Kitchen amLODipine (NORVASC) 10 MG tablet TAKE 1 TABLET BY MOUTH EVERY MORNING 90 tablet 0  . aspirin EC 81 MG tablet Take 81 mg by mouth every evening.    . Blood Glucose Monitoring Suppl (ACCU-CHEK AVIVA PLUS) w/Device KIT Use as advised 1 kit 1  . Cholecalciferol (VITAMIN D PO) Take 1 tablet by mouth daily.    . Dulaglutide (TRULICITY) 7.16 RC/7.8LF SOPN Inject 0.75 mg into the skin once a week. 6 mL 1  . gabapentin (NEURONTIN) 300 MG capsule TAKE 1 CAPSULE BY MOUTH THREE TIMES DAILY 270 capsule 0  . glucose blood (ACCU-CHEK AVIVA PLUS) test strip Use as instructed 4x a day 200 each 11  . glucose blood test strip Test 3 times daily. Dx E11.9. Pt uses accu-chek aviva plus meter 200 each 3  . hydrochlorothiazide (MICROZIDE) 12.5 MG capsule TAKE 1 CAPSULE BY MOUTH DAILY 90 capsule 0  . insulin lispro (HUMALOG KWIKPEN) 100 UNIT/ML KiwkPen INJECT 8-10 UNITS UNDER THE SKIN THREE TIMES DAILY 45 mL 3  . insulin lispro (HUMALOG KWIKPEN) 100  UNIT/ML KwikPen Inject 0.08-0.1 mLs (8-10 Units total) into the skin 3 (three) times daily with meals. 15 mL 11  . Insulin Pen Needle (B-D ULTRAFINE III SHORT PEN) 31G X 8 MM MISC 3 (three) times daily before meals.    . Insulin Pen Needle (CAREFINE PEN NEEDLES) 32G X 4 MM MISC Use 4x a day 300 each 3  . lisinopril (PRINIVIL,ZESTRIL) 10 MG tablet TAKE 1 TABLET BY MOUTH EVERY MORNING 90 tablet 0  . lovastatin (MEVACOR) 40 MG tablet TAKE 1 TABLET BY MOUTH AT BEDTIME 90 tablet 0  . metFORMIN (GLUCOPHAGE-XR) 500 MG 24  hr tablet Take 1 tablet (500 mg total) by mouth 2 (two) times daily with a meal. (Patient taking differently: Take 500 mg by mouth daily. ) 90 tablet 1  . metFORMIN (GLUCOPHAGE-XR) 500 MG 24 hr tablet TAKE 1 TABLET(500 MG) BY MOUTH TWICE DAILY WITH A MEAL 180 tablet 0  . polyethylene glycol (MIRALAX / GLYCOLAX) packet Take 17 g by mouth daily as needed for mild constipation. 14 each 0  . TOUJEO SOLOSTAR 300 UNIT/ML SOPN Inject 30 Units into the skin at bedtime. 45 mL 3   No current facility-administered medications on file prior to visit.    Allergies  Allergen Reactions  . Other     PT PREFERS TO NOT HAVE ANY NARCOTIC MEDICATIONS  . Chlorhexidine Rash    Burning after using CHG wipes-used for surgery  . Oxycodone Hcl     Other reaction(s): Hallucination Marked hallucinations and palpitations following dose of 79m on 04/30/2015    Family History  Problem Relation Age of Onset  . Diabetes Mother   . Heart disease Mother   . Kidney disease Brother        Two brothers on ESRD  . Amblyopia Neg Hx   . Blindness Neg Hx   . Cataracts Neg Hx   . Glaucoma Neg Hx   . Macular degeneration Neg Hx   . Retinal detachment Neg Hx   . Strabismus Neg Hx   . Retinitis pigmentosa Neg Hx    Pt has FH of DM in M, MGM, PGM, M aunt, uncles.  PE: BP 126/70   Pulse 76   SpO2 95% ;pt could not be weighted >> in wheelchair. Wt Readings from Last 3 Encounters:  01/19/18 232 lb (105.2 kg)   12/01/17 232 lb (105.2 kg)  11/23/17 232 lb (105.2 kg)   Constitutional: overweight, in NAD, in wheelchair Eyes: PERRLA, EOMI, no exophthalmos ENT: moist mucous membranes, no thyromegaly, no cervical lymphadenopathy Cardiovascular: RRR, No MRG Respiratory: CTA B Gastrointestinal: abdomen soft, NT, ND, BS+ Musculoskeletal: no deformities, strength intact in all 4 Skin: moist, warm, no rashes Neurological: no tremor with outstretched hands, DTR normal in all 4  ASSESSMENT: 1. DM2, insulin-dependent, uncontrolled, with complications - CKD stage 3 - PN - DR  - Of note, we tried to prescribe the Freestyle libre CGM for her but this was not covered by the insurance  2. Low B12  3. HL  PLAN:  1. Patient with longstanding, uncontrolled, type 2 diabetes, on oral antidiabetic regimen, with metformin, and also GLP-1 receptor agonist and basal-bolus insulin regimen.  After addition of Trulicity, her sugars improved significantly and she even started to develop low blood sugars despite using only a low dose of the GLP-1 receptor agonist.  At last visit we decreased her insulin doses.  We also discussed about the benefit of a plant-based diet, which she was trying to adopt.  Her HbA1c decreased significantly from 9% to 6.1%. - she had stress in her family since last visit >> sugars are a little higher, but still at goal.  -We discussed about increasing the dose of Trulicity to the target dose while we are decreasing the dose of Humalog.  She agrees to do this.  She has no side effects from Trulicity.  I did advise her that if her sugars remain in goal, she can stop Humalog completely - I suggested to:  Patient Instructions  Please continue: - Metformin ER 500 mg 1-2x a day with meals. - Toujeo 30 units at  night  Please increase: - Trulicity to 1.5 mg weekly  Please decrease: - Humalog to 5-7 units before the meals.  If the sugars remain as good as now, you can stop.  Please continue  B12 5000 mcg daily.  Please return in 4 months with your sugar log.   - today, HbA1c is 6.8% (higher), however, it should be lower based on her sugars at home.  We will also check a fructosamine level. - continue checking sugars at different times of the day - check 3x a day, rotating checks - advised for yearly eye exams >> she is UTD and she has an appointment scheduled already. - Return to clinic in 4 mo with sugar log    2. Low B12 -History of low B12 >> increased B12 supplement from 1000 to 5000 mcg daily -Latest B12 level was normal a year ago -We will recheck her B12 level now.  3. HL - Reviewed latest lipid panel from 10/2017: at goal Lab Results  Component Value Date   CHOL 147 11/05/2017   HDL 59 11/05/2017   LDLCALC 61 11/05/2017   TRIG 133 11/05/2017   CHOLHDL 2.5 11/05/2017  - Continues Mevacor without side effects.  Component     Latest Ref Rng & Units 03/16/2018  Hemoglobin A1C     4.0 - 5.6 % 6.8 (A)  Vitamin B12     211 - 911 pg/mL 261  Fructosamine     205 - 285 umol/L 275   HbA1c calculated from the fructosamine is 6.28%, better than the directly measured one. Vitamin B12 is still low in the normal range.  I think at this point she may need to start monthly IM B12 injections.  Will discuss with the patient.  Philemon Kingdom, MD PhD [facility] Endocrinology

## 2018-03-18 LAB — FRUCTOSAMINE: FRUCTOSAMINE: 275 umol/L (ref 205–285)

## 2018-03-19 ENCOUNTER — Telehealth: Payer: Self-pay

## 2018-03-19 ENCOUNTER — Encounter: Payer: Self-pay | Admitting: Internal Medicine

## 2018-03-19 NOTE — Telephone Encounter (Signed)
Notified patient of message from Dr. Elvera Lennox, patient expressed understanding and agreement. No further questions.  Patient is not interested in getting the B12 injections.

## 2018-03-19 NOTE — Telephone Encounter (Signed)
-----   Message from Carlus Pavlov, MD sent at 03/19/2018 10:04 AM EST ----- Cassandra Stewart, can you please call pt: HbA1c calculated from the fructosamine is 6.28%, better than the directly measured one. Vitamin B12 is still low in the normal range on the 5000 mcg B12 daily.  I think at this point she may need to start monthly IM B12 injections.  She can have them done here or in PCPs office.  Which she agree with this?

## 2018-03-24 ENCOUNTER — Other Ambulatory Visit: Payer: Self-pay | Admitting: Family Medicine

## 2018-03-25 ENCOUNTER — Telehealth: Payer: Self-pay | Admitting: Family Medicine

## 2018-03-25 NOTE — Telephone Encounter (Signed)
Received requested records from [facility]. Sending back for review.

## 2018-03-30 ENCOUNTER — Other Ambulatory Visit: Payer: Self-pay | Admitting: Family Medicine

## 2018-03-30 ENCOUNTER — Telehealth: Payer: Self-pay | Admitting: Family Medicine

## 2018-03-30 MED ORDER — HYDROCHLOROTHIAZIDE 12.5 MG PO CAPS: 12.5000 mg | ORAL_CAPSULE | Freq: Every day | ORAL | 0 refills | Status: DC

## 2018-03-30 NOTE — Telephone Encounter (Signed)
Pt needs refill HCTZ & lovastatin 90 days to [facility]

## 2018-03-30 NOTE — Telephone Encounter (Signed)
Sent in refill on hctz..  Dr. Wyonia Hough refilled lovastatin on 03/16/18 # 90 with 3 refills to [facility]

## 2018-04-01 DIAGNOSIS — L8995 Pressure ulcer of unspecified site, unstageable: Secondary | ICD-10-CM | POA: Diagnosis not present

## 2018-04-10 ENCOUNTER — Encounter: Payer: Self-pay | Admitting: Internal Medicine

## 2018-04-21 ENCOUNTER — Telehealth: Payer: Self-pay | Admitting: *Deleted

## 2018-04-21 ENCOUNTER — Ambulatory Visit (HOSPITAL_BASED_OUTPATIENT_CLINIC_OR_DEPARTMENT_OTHER): Payer: Medicare Other | Admitting: Physical Medicine & Rehabilitation

## 2018-04-21 ENCOUNTER — Encounter: Payer: Self-pay | Admitting: Physical Medicine & Rehabilitation

## 2018-04-21 ENCOUNTER — Encounter: Payer: Medicare Other | Attending: Physical Medicine & Rehabilitation

## 2018-04-21 VITALS — BP 122/74 | HR 65 | Resp 14 | Ht 64.5 in | Wt 232.0 lb

## 2018-04-21 DIAGNOSIS — M4714 Other spondylosis with myelopathy, thoracic region: Secondary | ICD-10-CM | POA: Diagnosis not present

## 2018-04-21 DIAGNOSIS — E785 Hyperlipidemia, unspecified: Secondary | ICD-10-CM | POA: Insufficient documentation

## 2018-04-21 DIAGNOSIS — M792 Neuralgia and neuritis, unspecified: Secondary | ICD-10-CM | POA: Diagnosis not present

## 2018-04-21 DIAGNOSIS — G8220 Paraplegia, unspecified: Secondary | ICD-10-CM

## 2018-04-21 DIAGNOSIS — I10 Essential (primary) hypertension: Secondary | ICD-10-CM | POA: Insufficient documentation

## 2018-04-21 DIAGNOSIS — E1165 Type 2 diabetes mellitus with hyperglycemia: Secondary | ICD-10-CM | POA: Diagnosis not present

## 2018-04-21 MED ORDER — GABAPENTIN 400 MG PO CAPS: 400.0000 mg | ORAL_CAPSULE | Freq: Three times a day (TID) | ORAL | 0 refills | Status: DC

## 2018-04-21 NOTE — Progress Notes (Signed)
Subjective:    Patient ID: Cassandra Stewart, female    DOB: May 30, 1950, 69 y.o.   MRN: [id number]  HPI LLQ pain - still present but improves with bowel movement or empty bladder. Pain increased with activity Has LLE> RLE  Numness back of the left leg runnign down to hip into leg and sometimes into the foot.   Subjective:    Patient ID: Cassandra Stewart, female    DOB: 08-25-1950, 68 y.o.   MRN: [id number]  HPI 3 year hx of progressive BLE pain, which was first noted when going up and down stairs Initially sen at [facility] admitted without surgery for a few days and transferred to [facility].  LE weakness progressed a [facility] and was admitted to [facility] 5/8-5/15/2017 for inability to walk Had UTI, MRI of T spine showed severe stenosis T10-11 Underwent decompression 07/06/15, some improvement in pain Post op was working with therapy , walking with walker LLE pain- "bone is aching" Last MRI of Lumbar spine 11/08/15, didn't have any evidence of nerve root impingement  Hx of prior lumbar disc surgery ~1989  No bowel or bladder dysfunction, occ incont due to immobility Tylenol arthritis 2 tablets per day In motorized scooter  Mod I dressing and bathing Using walker household distance Lives alone, no steps in home  Last PT through home health in July  2018  EMG/NCV at [facility] ~56yrs ago Neuropathy findings in BUE and BLE Last visit with [facility] 01/2017, discussed low back pain and pt was subsequently referred Pain Inventory Average Pain 9 Pain Right Now 6 My pain is constant and aching  In the last 24 hours, has pain interfered with the following? General activity 9 Relation with others 9 Enjoyment of life 9 What TIME of day is your pain at its worst? morning, evening, night Sleep (in general) Fair  Pain is worse with: walking and standing Pain improves with: no selection Relief from Meds: no selection  Mobility ability to climb steps?  no do you drive?   no use a wheelchair  Function disabled: date disabled .  Neuro/Psych trouble walking  Prior Studies new visit CLINICAL DATA:  68 year old female with low back pain. 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 Status post thoracic spine surgery in May. Subsequent encounter.  EXAM: MRI THORACIC SPINE WITHOUT AND WITH CONTRAST  TECHNIQUE: Multiplanar and multiecho pulse sequences of the thoracic spine were obtained without and with intravenous contrast.  CONTRAST:  20 mL MultiHance  COMPARISON:  Preoperative thoracic spine MRI 07/04/2015.  FINDINGS: Limited sagittal imaging of the cervical spine re- demonstrates evidence of moderate to severe disc and endplate degeneration at C5-C6 and C6-C7 (series 16, image 8).  Stable overall thoracic vertebral height and alignment since the preoperative study. Heterogeneous bone marrow signal which appears to be degenerative in nature throughout the thoracic spine, and is associated with widespread endplate spurring. Postoperative changes at the T10-T11 level are detailed below. No acute osseous abnormality identified.  Negative visualized thoracic and upper abdominal viscera. Negative posterior paraspinal soft tissues aside from postoperative granulation tissue from the T8 to the T11 levels with no postoperative fluid collection identified.  T1-T2: Mild facet hypertrophy.  T2-T3: Mild facet hypertrophy.  T3-T4: Mild uncovertebral hypertrophy.  T4-T5: Mild uncovertebral hypertrophy. Mild to moderate right facet hypertrophy. This level is stable.  T5-T6: Disc space loss with circumferential disc osteophyte complex. Stable narrowing of the ventral CSF space without significant spinal stenosis. Mild T5 foraminal stenosis. This level  is stable.  T6-T7: Disc space loss. Circumferential disc osteophyte complex. Narrowing of the ventral CSF space without significant spinal stenosis. No foraminal  stenosis. This level is stable.  T7-T8: Disc space loss with circumferential disc osteophyte complex. Narrowing the ventral CSF space. Mild ligament flavum hypertrophy. Overall mild spinal stenosis. Mild T7 foraminal stenosis. This level is stable.  T8-T9: Disc space loss with circumferential disc osteophyte complex. Moderate to severe facet hypertrophy. Ligament flavum hypertrophy. Spinal stenosis with no spinal cord mass effect. Mild T8 foraminal stenosis. This level is stable.  T9-T10: Circumferential disc osteophyte complex. Moderate to severe facet hypertrophy. Spinal stenosis with no definite spinal cord mass effect. Mild right T9 foraminal stenosis. This level is stable.  T10-T11: Stable trace increased T2 signal in the disc space. Sequelae of posterior decompression. Residual facet hypertrophy. Improved thecal sac patency. Questionable mild residual spinal cord signal abnormality here (series 23, image 8). Moderate to severe left and moderate right T10 foraminal stenosis appears stable.  T11-T12: Chronic circumferential disc osteophyte complex and moderate facet hypertrophy. Spinal stenosis with no definite spinal cord mass effect. Moderate left greater than right T11 foraminal stenosis. This level is stable.  T12-L1:  Minimal disc bulge.  Mild facet hypertrophy.  No stenosis.  L1-L2: Negative.  The conus medullaris appears normal at the L2 level. Aside from T10-T11, no thoracic spinal cord signal abnormality is identified. No abnormal intradural enhancement.  IMPRESSION: 1. Posterior decompression at T10-T11 with improved thecal sac patency at that level. 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 2. Other thoracic levels are stable since May. Multifactorial spinal stenosis from T7-T8 through T11-T12. No definite spinal cord signal abnormality. Moderate or severe degenerative  foraminal stenosis at the T10 and T11 nerve levels. 3. Advanced cervical spine disc and endplate degeneration at C5-C6 and C6-C7 with suspected associated degenerative cervical spinal stenosis.   Electronically Signed   By: Odessa Fleming M.D.   On: 11/08/2015 17:34  MR LUMBAR SPINE FINDINGS  S1 is fully lumbarized. There are 6 lumbarized vertebral segments the lowest of which will be labeled S1. This is counting from skullbase down. This places the severe spinal stenosis and cord hyperintensity at T10-11.  Normal lumbar alignment. Negative for fracture or mass lesion. Conus medullaris normal and terminates at approximately mid L3. No evidence of tethered cord or thickening of the filum terminale. Low lying conus medullaris felt to be related to an extra lumbar vertebral segment.  L1-2:  Negative  L2-3:  Negative  L3-4:  Negative  L4-5: Mild disc and mild facet degeneration causing mild spinal stenosis  L5-S1: Disc and moderate facet degeneration.  Mild spinal stenosis  S1-2: Postop changes on the left with epidural scarring. Disc degeneration and spondylosis without disc protrusion.  Physicians involved in your care  new visit   Family History  Problem Relation Age of Onset  . Diabetes Mother   . Heart disease Mother   . Kidney disease Brother        Two brothers on ESRD  . Amblyopia Neg Hx   . Blindness Neg Hx   . Cataracts Neg Hx   . Glaucoma Neg Hx   . Macular degeneration Neg Hx   . Retinal detachment Neg Hx   . Strabismus Neg Hx   . Retinitis pigmentosa Neg Hx    Social History   Socioeconomic History  . Marital status: Divorced    Spouse name: Not on file  .  Number of children: Not on file  . Years of education: Not on file  . Highest education level: Not on file  Occupational History  . Not on file  Social Needs  . Financial resource strain: Not on file  . Food insecurity:    Worry: Not on file    Inability: Not on file  .  Transportation needs:    Medical: Not on file    Non-medical: Not on file  Tobacco Use  . Smoking status: Never Smoker  . Smokeless tobacco: Never Used  Substance and Sexual Activity  . Alcohol use: No  . Drug use: No  . Sexual activity: Not Currently  Lifestyle  . Physical activity:    Days per week: Not on file    Minutes per session: Not on file  . Stress: Not on file  Relationships  . Social connections:    Talks on phone: Not on file    Gets together: Not on file    Attends religious service: Not on file    Active member of club or organization: Not on file    Attends meetings of clubs or organizations: Not on file    Relationship status: Not on file  Other Topics Concern  . Not on file  Social History Narrative  . Not on file   Past Surgical History:  Procedure Laterality Date  . APPENDECTOMY  1969  . BREAST BIOPSY Right   . CATARACT EXTRACTION     May [id number]  . LUMBAR DISC SURGERY    . LUMBAR LAMINECTOMY/DECOMPRESSION MICRODISCECTOMY N/A 07/06/2015   Procedure: Thoracic ten- eleven laminectomy for spinal canal decompression;  Surgeon: Coletta Memos, MD;  Location: [facility];  Service: Orthopedics;  Laterality: N/A;   Past Medical History:  Diagnosis Date  . Elevated LFTs   . Fatty liver    on [place].   Marland Kitchen Hyperlipemia   . Hypertension   . Uncontrolled type 2 diabetes with neuropathy (HCC)    BP 122/74 (BP Location: Left Arm, Patient Position: Sitting, Cuff Size: Large)   Pulse 65   Resp 14   Ht 5' 4.5" (1.638 m)   Wt 232 lb (105.2 kg) Comment: previous  SpO2 97%   BMI 39.21 kg/m   Opioid Risk Score:   Fall Risk Score:  `1  Depression screen PHQ 2/9  Depression screen [facility] 2/9 11/05/2017 10/21/2017 09/09/2017 08/05/2016 08/01/2016 07/29/2016 10/25/2015  Decreased Interest 0 0 0 0 0 0 0  Down, Depressed, Hopeless 3 0 0 0 0 0 0  PHQ - 2 Score 3 0 0 0 0 0 0    Review of Systems  Constitutional: Negative.   HENT: Negative.   Eyes: Negative.   Respiratory:  Negative.   Cardiovascular: Negative.   Gastrointestinal: Negative.   Endocrine: Negative.   Genitourinary: Negative.   Musculoskeletal: Positive for arthralgias, back pain and gait problem.  Skin: Negative.   Allergic/Immunologic: Negative.   Hematological: Negative.   Psychiatric/Behavioral: Negative.        Objective:   Physical Exam  Constitutional: She is oriented to person, place, and time. She appears well-developed and well-nourished. No distress.  HENT:  Head: Normocephalic and atraumatic.  Eyes: Conjunctivae and EOM are normal. Pupils are equal, round, and reactive to light.  Neck: Normal range of motion. Neck supple.   Abdominal: Soft. Bowel sounds are normal. She exhibits no distension. There is no tenderness.  Musculoskeletal:  There is tenderness palpation in the lumbar paraspinal area there is no  tenderness palpation over the thoracic paraspinal or the thoracic spinous processes.    Neurological: She is alert and oriented to person, place, and time.  Motor strength is 5/5 bilateral deltoid bicep tricep grip 4/5 right hip flexor knee extensor ankle was flexor 3/5 in the left hip flexor knee extensor ankle was flexor Negative straight leg raising Sensation reduced to pinprick and light touch below the knees bilaterally.  Intact sensation in the hands.   Skin: Skin is warm and dry. She is not diaphoretic.  Psychiatric: She has a normal mood and affect. Her behavior is normal.  Nursing note and vitals reviewed.         Assessment & Plan:  1.  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  She could likely increase her left lower extremity strength as well as improve her mobility with walker.  2.  Lumbar pain is likely multifactorial with Lumbar DDD and facet arthropathy Would rec increasing tylenol arthritis up to max of  per day  If this is not helpful may consider lumbar facet injections Physical medicine  rehabilitation follow-up in 6 weeks    Pain Inventory Average Pain 6 Pain Right Now 3 My pain is ?  In the last 24 hours, has pain interfered with the following? General activity 4 Relation with others 4 Enjoyment of life 5 What TIME of day is your pain at its worst? ? Sleep (in general) Poor  Pain is worse with: walking, standing and some activites Pain improves with: medication Relief from Meds: 8  Mobility use a wheelchair transfers alone  Function retired  Neuro/Psych weakness trouble walking  Prior Studies Any changes since last visit?  no  Physicians involved in your care Any changes since last visit?  no   Family History  Problem Relation Age of Onset  . Diabetes Mother   . Heart disease Mother   . Kidney disease Brother        Two brothers on ESRD  . Amblyopia Neg Hx   . Blindness Neg Hx   . Cataracts Neg Hx   . Glaucoma Neg Hx   . Macular degeneration Neg Hx   . Retinal detachment Neg Hx   . Strabismus Neg Hx   . Retinitis pigmentosa Neg Hx    Social History   Socioeconomic History  . Marital status: Divorced    Spouse name: Not on file  . Number of children: Not on file  . Years of education: Not on file  . Highest education level: Not on file  Occupational History  . Not on file  Social Needs  . Financial resource strain: Not on file  . Food insecurity:    Worry: Not on file    Inability: Not on file  . Transportation needs:    Medical: Not on file    Non-medical: Not on file  Tobacco Use  . Smoking status: Never Smoker  . Smokeless tobacco: Never Used  Substance and Sexual Activity  . Alcohol use: No  . Drug use: No  . Sexual activity: Not Currently  Lifestyle  . Physical activity:    Days per week: Not on file    Minutes per session: Not on file  . Stress: Not on file  Relationships  . Social connections:    Talks on phone: Not on file    Gets together: Not on file    Attends religious service: Not on file    Active  member of  club or organization: Not on file    Attends meetings of clubs or organizations: Not on file    Relationship status: Not on file  Other Topics Concern  . Not on file  Social History Narrative  . Not on file   Past Surgical History:  Procedure Laterality Date  . APPENDECTOMY  1969  . BREAST BIOPSY Right   . CATARACT EXTRACTION     May [address]76  . LUMBAR DISC SURGERY    . LUMBAR LAMINECTOMY/DECOMPRESSION MICRODISCECTOMY N/A 07/06/2015   Procedure: Thoracic ten- eleven laminectomy for spinal canal decompression;  Surgeon: Coletta Memos, MD;  Location: [facility];  Service: Orthopedics;  Laterality: N/A;   Past Medical History:  Diagnosis Date  . Elevated LFTs   . Fatty liver    on [place].   Marland Kitchen Hyperlipemia   . Hypertension   . Uncontrolled type 2 diabetes with neuropathy (HCC)    BP 122/74 (BP Location: Left Arm, Patient Position: Sitting, Cuff Size: Large)   Pulse 65   Resp 14   Ht 5' 4.5" (1.638 m)   Wt 232 lb (105.2 kg) Comment: previous  SpO2 97%   BMI 39.21 kg/m   Opioid Risk Score:   Fall Risk Score:  `1  Depression screen PHQ 2/9  Depression screen [facility] 2/9 11/05/2017 10/21/2017 09/09/2017 08/05/2016 08/01/2016 07/29/2016 10/25/2015  Decreased Interest 0 0 0 0 0 0 0  Down, Depressed, Hopeless 3 0 0 0 0 0 0  PHQ - 2 Score 3 0 0 0 0 0 0    Review of Systems  Constitutional: Negative.   HENT: Negative.   Eyes: Negative.   Respiratory: Negative.   Cardiovascular: Negative.   Gastrointestinal: Negative.   Endocrine: Negative.   Genitourinary: Negative.   Musculoskeletal: Positive for arthralgias and gait problem.  Skin: Negative.   Allergic/Immunologic: Negative.   Neurological: Positive for weakness.  Psychiatric/Behavioral: Negative.   All other systems reviewed and are negative.      Objective:   Physical Exam  See above The patient has 3+ in the left hip flexor knee extensor ankle dorsiflexor 4+ in the right hip flexor knee extensor ankle  dorsiflexor No pain to palpation in the abdominal area    Assessment & Plan:  #1.  Left lower quadrant pain is likely neurogenic.  We discussed dermatomal distribution of T10 and T11.  This corresponds with her weaker leg. We discussed treatment for this.  She initially indicated that her gabapentin was taken 600 mg nightly.  She has daytime pain as well as nighttime pain.  She indicates that she takes 1 gabapentin during the day.  We discussed going up to the 400 mg dose and taking 1 tablet 3 times a day. After leaving the clinic the patient called back and stated that she really did not take a dose during the day.  She would like to continue 600 mg at night.  This is the dose that was prescribed by her primary care and she can call their office for another prescription for this. I do not expect any further neurologic recovery but she can make some functional improvements

## 2018-04-21 NOTE — Telephone Encounter (Signed)
Mrs Dalpiaz called and says that Dr Wynn Banker went up on her gabapentin and she does not want to do that.  She says she was having numbness in her hands at night with the 300mg  1 in day and 2 at hs.  She stopped taking the daytime dose and the numbness eased up.  She wants to stay with the 300mg  , 2 at hs. Do you want to redorder it that way?

## 2018-04-21 NOTE — Telephone Encounter (Signed)
Her last order was from the PCP, she can call the PCP if she wants to continue with the current dose.

## 2018-04-22 NOTE — Telephone Encounter (Signed)
D/C's order for gabapentin and left message to follow pcp orders for gabapentin on VM per DPR.

## 2018-04-24 ENCOUNTER — Telehealth: Payer: Self-pay | Admitting: Family Medicine

## 2018-04-24 NOTE — Telephone Encounter (Signed)
Pt called and stated her parking placard is getting ready to expire, She requested we complete one for her and MAIL to her. Address in system was verified. Sending back to be completed.

## 2018-04-24 NOTE — Telephone Encounter (Signed)
This will need to be filled out by her orthopedist or back specialist who is treating her for the reason she needs the placard.

## 2018-04-27 NOTE — Telephone Encounter (Signed)
Pt was notified it was ready and requested that I mail it to her

## 2018-04-30 DIAGNOSIS — M4804 Spinal stenosis, thoracic region: Secondary | ICD-10-CM | POA: Diagnosis not present

## 2018-04-30 DIAGNOSIS — L8995 Pressure ulcer of unspecified site, unstageable: Secondary | ICD-10-CM | POA: Diagnosis not present

## 2018-04-30 DIAGNOSIS — G992 Myelopathy in diseases classified elsewhere: Secondary | ICD-10-CM | POA: Diagnosis not present

## 2018-06-10 ENCOUNTER — Other Ambulatory Visit: Payer: Self-pay | Admitting: Family Medicine

## 2018-06-10 ENCOUNTER — Other Ambulatory Visit: Payer: Self-pay

## 2018-06-10 ENCOUNTER — Encounter: Payer: Self-pay | Admitting: Family Medicine

## 2018-06-10 ENCOUNTER — Ambulatory Visit (INDEPENDENT_AMBULATORY_CARE_PROVIDER_SITE_OTHER): Payer: Medicare Other | Admitting: Family Medicine

## 2018-06-10 VITALS — Wt 220.0 lb

## 2018-06-10 DIAGNOSIS — E1140 Type 2 diabetes mellitus with diabetic neuropathy, unspecified: Secondary | ICD-10-CM | POA: Diagnosis not present

## 2018-06-10 DIAGNOSIS — I10 Essential (primary) hypertension: Secondary | ICD-10-CM

## 2018-06-10 DIAGNOSIS — IMO0002 Reserved for concepts with insufficient information to code with codable children: Secondary | ICD-10-CM

## 2018-06-10 DIAGNOSIS — E1165 Type 2 diabetes mellitus with hyperglycemia: Secondary | ICD-10-CM

## 2018-06-10 DIAGNOSIS — R29898 Other symptoms and signs involving the musculoskeletal system: Secondary | ICD-10-CM

## 2018-06-10 DIAGNOSIS — R262 Difficulty in walking, not elsewhere classified: Secondary | ICD-10-CM | POA: Diagnosis not present

## 2018-06-10 DIAGNOSIS — M4804 Spinal stenosis, thoracic region: Secondary | ICD-10-CM

## 2018-06-10 NOTE — Telephone Encounter (Signed)
Per vickie, she was going to deny this med at this time and pt was suppose to follow-up with back specialist about this med

## 2018-06-10 NOTE — Progress Notes (Addendum)
Subjective:    Patient ID: Cassandra Stewart, female    DOB: 11/02/1950, 68 y.o.   MRN: [id number]  HPI Documentation for virtual telephone encounter. States she is unable to use a video based app on her phone or computer.    The patient was located at home. 2 patient identifiers used.  The provider was located at home.  The patient did consent to this visit and is aware of possible charges through their insurance for this visit.  The other persons participating in this telemedicine service were none.  Concerns today regarding her manual wheelchair.  Reports she first started using a wheelchair in 2016 or 2017 when she was in a nursing home recovering from back surgery.  States she has used it since.  States she uses a manual wheelchair inside the house and an electric wheelchair outside her home.  States she needs the manual chair daily.  Reports the armrests are torn and she is worried she may cut her forearms.  She reports that it also has a tire issue and several tears in the back.  States she needs a new wheelchair. States she did not mention this to Dr. Letta Pate who is managing her back pain and paraplegia. She has asked me to refill her gabapentin in the past but recently Dr. Letta Pate recommended a different treatment regimen and I prefer that he take care of her pain and that I avoid interfering and being in the middle. She is understanding.    HTN-does not check her blood pressure at home.  States she does not have a blood pressure cuff. BP at her appointment with Dr. Letta Pate in February was 122/74.  Reports taking amlodipine, lisinopril and HCTZ daily without any concerns or side effects. Requests refills.   She sees Dr. Cruzita Lederer for diabetes and was recently there in January 2020. Her statin was refilled. Her BP was again in goal range.   She reports staying at home and feeling well. States she is getting somewhat stir crazy about Covid-19 and social distancing but plans to  continue doing what is recommended.   Denies fever, chills, dizziness, chest pain, palpitations, shortness of breath, cough, abdominal pain, N/V/D.      Past Medical History:  Diagnosis Date  . Elevated LFTs   . Fatty liver    on [place].   Marland Kitchen Hyperlipemia   . Hypertension   . Uncontrolled type 2 diabetes with neuropathy [facility])    Past Surgical History:  Procedure Laterality Date  . APPENDECTOMY  1969  . BREAST BIOPSY Right   . CATARACT EXTRACTION     May 21016  . LUMBAR DISC SURGERY    . LUMBAR LAMINECTOMY/DECOMPRESSION MICRODISCECTOMY N/A 07/06/2015   Procedure: Thoracic ten- eleven laminectomy for spinal canal decompression;  Surgeon: Ashok Pall, MD;  Location: [place];  Service: Orthopedics;  Laterality: N/A;   Current Outpatient Medications on File Prior to Visit  Medication Sig Dispense Refill  . ACCU-CHEK SOFTCLIX LANCETS lancets Test 3 times daily. Pt uses accu-chek softclix lancing device 200 each 3  . acetaminophen (ACETAMINOPHEN 8 HOUR) 650 MG CR tablet Take 650 mg by mouth every 8 (eight) hours as needed for pain.    Marland Kitchen albuterol (PROVENTIL HFA) 108 (90 Base) MCG/ACT inhaler Inhale into the lungs.    Marland Kitchen aspirin EC 81 MG tablet Take 81 mg by mouth every evening.    . Blood Glucose Monitoring Suppl (ACCU-CHEK AVIVA PLUS) w/Device KIT Use as advised 1 kit 1  .  Cholecalciferol (VITAMIN D PO) Take 1 tablet by mouth daily.    . Dulaglutide (TRULICITY) 1.5 JQ/4.9EE SOPN Inject 1.5 mg into the skin once a week. 4 pen 11  . glucose blood (ACCU-CHEK AVIVA PLUS) test strip Use as instructed 4x a day 200 each 11  . insulin lispro (HUMALOG KWIKPEN) 100 UNIT/ML KwikPen Inject 0.05-0.07 mLs (5-7 Units total) into the skin 3 (three) times daily with meals. 15 mL 11  . Insulin Pen Needle (CAREFINE PEN NEEDLES) 32G X 4 MM MISC Use 4x a day 300 each 3  . lovastatin (MEVACOR) 40 MG tablet Take 1 tablet (40 mg total) by mouth at bedtime. 90 tablet 3  . metFORMIN (GLUCOPHAGE-XR) 500 MG 24 hr  tablet TAKE 1 TABLET(500 MG) BY MOUTH TWICE DAILY WITH A MEAL 180 tablet 0  . polyethylene glycol (MIRALAX / GLYCOLAX) packet Take 17 g by mouth daily as needed for mild constipation. 14 each 0  . TOUJEO SOLOSTAR 300 UNIT/ML SOPN Inject 30 Units into the skin at bedtime. 45 mL 3   No current facility-administered medications on file prior to visit.      BMP Latest Ref Rng & Units 11/23/2017 11/05/2017 10/24/2016  Glucose 70 - 99 mg/dL 103(H) 69 73  BUN 8 - 23 mg/dL 21 20 23   Creatinine 0.44 - 1.00 mg/dL 1.10(H) 1.06(H) 1.14(H)  BUN/Creat Ratio 12 - 28 - 19 -  Sodium 135 - 145 mmol/L 140 144 144  Potassium 3.5 - 5.1 mmol/L 4.8 4.9 5.0  Chloride 98 - 111 mmol/L 107 103 108  CO2 22 - 32 mmol/L 25 25 24   Calcium 8.9 - 10.3 mg/dL 9.4 9.9 9.9    Reviewed allergies, medications, past medical, surgical, family, and social history.   Review of Systems Pertinent positives and negatives in the history of present illness.     Objective:   Physical Exam Wt 220 lb (99.8 kg)   BMI 37.18 kg/m   Alert and oriented and in no acute distress. Speaking in complete sentences. Normal mood, thought process.       Assessment & Plan:  Essential hypertension  Uncontrolled type 2 diabetes with neuropathy (HCC)  Weakness of left leg  Morbid obesity (HCC)  Inability to walk  Thoracic spinal stenosis  Discussed the limitations of a telephone visit but that this is due to the current Covid- 19 pandemic and in accordance with current CDC guidelines. She verbalized understanding.  Does not have video access.   HTN- reports good daily compliance with medications. No concerns. Review of previous readings over the past 2 months are in normal range. Refill medications. Reviewed recent lab results. Encouraged her to eat a low sodium diet and to see if someone can get her BP machine to use at home. She will ask [place] about this.   Reviewed notes from Dr. Letta Pate. Expectations are to continue  wheelchair use and to improve functional mobility with walker. PT was recommended. They are managing her back pain and leg weakness. I have refilled her gabapentin in the past per request but will appreciate recommendation and rely on Dr. Letta Pate going forward. She is aware. She is due for follow up next month with Dr. Letta Pate.  Advised that I am happy to place order for new wheelchair if he recommends this is needed for foreseeable future and does not want to order this for her.   She also is followed by Dr. Ashok Pall, back surgeon.   Diabetes managed by endocrinologist, Dr. Cruzita Lederer. Last  seen in January 2020.   Encouraged her to follow up in late June or early July in my office for recheck of HTN and CKD. Will need lipids if not done elsewhere.  Also due to repeat mammogram in May per records.   Time spent on call was 22 minutes and in review of previous records 4 minutes total.  This virtual service is not related to other E/M service within previous 7 days.  99441 (5-73mn) 99442 (11-236m) 99443 (21-3022m

## 2018-06-11 ENCOUNTER — Telehealth: Payer: Self-pay

## 2018-06-11 NOTE — Telephone Encounter (Signed)
Patient has an appt on 5/26.2020 for 3 month f/u with you. Can it be switch to [facility] or does she need separate appt?

## 2018-06-11 NOTE — Telephone Encounter (Signed)
Please have patient make an appointment with Riley Lam for a wheelchair evaluation next month

## 2018-06-11 NOTE — Telephone Encounter (Signed)
Patient is calling requesting a manual wheelchair.

## 2018-06-11 NOTE — Telephone Encounter (Signed)
May switch to [facility] and move it up earlier in May

## 2018-06-11 NOTE — Addendum Note (Signed)
Addended by: Avanell Shackleton on: 06/11/2018 11:01 AM   Modules accepted: Level of Service

## 2018-06-13 ENCOUNTER — Other Ambulatory Visit: Payer: Self-pay | Admitting: Internal Medicine

## 2018-06-17 ENCOUNTER — Other Ambulatory Visit: Payer: Self-pay | Admitting: *Deleted

## 2018-06-17 MED ORDER — GABAPENTIN 300 MG PO CAPS: 300.0000 mg | ORAL_CAPSULE | Freq: Three times a day (TID) | ORAL | 0 refills | Status: DC

## 2018-06-17 NOTE — Telephone Encounter (Signed)
Dr. Wynn Banker,  I spoke with our mutual patient and since you have been managing her pain, I would prefer that she follow your recommendations regarding gabapentin dosing. I will graciously remove myself from treating her pain. I think this would be best for the patient, for [place] and keep things clearer. Please let me know your thoughts.

## 2018-06-17 NOTE — Telephone Encounter (Signed)
Patient contacted  Our office expressing concern about Rx for gabapentin being incorrect. I reviewed the patients chart discovered that a telephone encounter indicates that Dr. Wynn Banker defers gabapentin back to primary care.  Patient is requesting gabapentin 300 mg 3 times a day.  See telephone encounter note included in this message.  Thank You

## 2018-06-18 ENCOUNTER — Encounter: Payer: Self-pay | Admitting: Family Medicine

## 2018-06-18 ENCOUNTER — Other Ambulatory Visit: Payer: Self-pay | Admitting: *Deleted

## 2018-06-18 MED ORDER — GABAPENTIN 300 MG PO CAPS: 300.0000 mg | ORAL_CAPSULE | Freq: Three times a day (TID) | ORAL | 0 refills | Status: DC

## 2018-06-18 NOTE — Telephone Encounter (Signed)
We took care of it. AK

## 2018-06-19 NOTE — Telephone Encounter (Signed)
Pt is requesting manuel wheelchair. Per Riley Lam no need for evaluation. Order for [facility] wheelchair sent to [facility].

## 2018-06-24 ENCOUNTER — Telehealth: Payer: Self-pay | Admitting: Registered Nurse

## 2018-06-24 DIAGNOSIS — M792 Neuralgia and neuritis, unspecified: Secondary | ICD-10-CM

## 2018-06-24 DIAGNOSIS — G8220 Paraplegia, unspecified: Secondary | ICD-10-CM

## 2018-06-24 DIAGNOSIS — M4714 Other spondylosis with myelopathy, thoracic region: Secondary | ICD-10-CM

## 2018-06-24 NOTE — Telephone Encounter (Signed)
Ms. Cassandra Stewart called office asking for a manual wheelchair. Prescription was sent to [facility]. [facility] are requesting documentation such as the type of wheelchair,mobility limitation, safety with assistive device and her ability to propel. Referral placed for Physical Therapy for Manual Wheelchair. Ms. Cassandra Stewart is aware of the above.   Ms. Cassandra Stewart reports she has a scooter, unable to maneuver in her home with scooter. Her manual wheelchair she currently has is falling apart.

## 2018-06-26 DIAGNOSIS — G8220 Paraplegia, unspecified: Secondary | ICD-10-CM | POA: Diagnosis not present

## 2018-06-26 DIAGNOSIS — L8995 Pressure ulcer of unspecified site, unstageable: Secondary | ICD-10-CM | POA: Diagnosis not present

## 2018-06-26 DIAGNOSIS — G992 Myelopathy in diseases classified elsewhere: Secondary | ICD-10-CM | POA: Diagnosis not present

## 2018-06-26 DIAGNOSIS — M4804 Spinal stenosis, thoracic region: Secondary | ICD-10-CM | POA: Diagnosis not present

## 2018-07-10 ENCOUNTER — Other Ambulatory Visit: Payer: Self-pay

## 2018-07-12 NOTE — Patient Instructions (Addendum)
Please continue: - Metformin ER 500 mg 1-2x a day with meals. - Toujeo 30 units at night - Trulicity 1.5 mg weekly  Please start checking sugars and re- add: - Humalog 5-7 units before the meals.  Please continue B12 5000 mcg daily.  Please return in 4 months with your sugar log.

## 2018-07-12 NOTE — Progress Notes (Signed)
-Patient ID: Cassandra Stewart, female   DOB: [birth year], 68 y.o.   MRN: [id number]   HPI: Cassandra Stewart is a 68 y.o.-year-old female, returning for follow-up for DM2, dx in 1992, insulin-dependent since 2006, uncontrolled, with complications (CKD stage 3, PN, DR).  Last visit 4 months ago.  Before last visit, her younger sister just died from pancreatic cancer.  Her sugars were higher than due to stress and traveling.  Now sugars are higher due to the coronavirus and not getting out to buy fresh foods.  She stopped checking sugars recently out of frustration.  Last hemoglobin A1c was: 03/16/2018: HbA1c calculated from fructosamine 6.28% Lab Results  Component Value Date   HGBA1C 6.8 (A) 03/16/2018   HGBA1C 6.1 (A) 11/12/2017   HGBA1C 9.0 07/11/2017   HGBA1C 9.0 04/14/2017   HGBA1C 8.7% 10/24/2016   HGBA1C 9.6% 07/25/2016   HGBA1C 10.5% 03/04/2016   HGBA1C 8.1% 10/25/2015   HGBA1C 9.7 (H) 07/04/2015   Pt was on a regimen of: - Metformin 500 mg 1x a day with dinner.  She had diarrhea with a higher dose. - Toujeo 45 units in am - Humalog 18 units 2-3x a day, before meals Tried: Actos, Lantus.  Now on: - Metformin ER 500 mg 1-2x a day with meals. - Trulicity 1.88 mg weekly - started 10/2017 >> 1.5 mg weekly-increased 02/2018 - Toujeo 45 >> 38 >> 30 units at night - Humalog: 8-10 >> 9-12 >> 5-7 units before meals (2x a day) >> now off for 1 mo  She was checking sugars 1-2 times a day, now not checking frequently: - am: 146-210 >> 140-183 >> 63, 74-91 >> 90-103 >> 90-100 - 2h after b'fast: n/c - before lunch: n/c >> 80-110 >> n/c >> 60s, 70 - 2h after lunch: n/c - before dinner: n/c >>  80-110 >> 90-100 >> 188 - 2h after dinner: n/c - bedtime: n/c - nighttime: n/c Lowest sugar was 60 >> 90 >> 60s; she has hypoglycemia awareness in the 80s. Highest sugar was 300s >> 183 >> 148 >> 123 >>  188.  Glucometer: Freestyle  Pt's meals are: - Breakfast/brunch: egg, bacon, toast  >> no b'fast now - Lunch: snack mostly (no Humalog) >> Meals on Wheels - Dinner: chicken/fish + vegetables - Snacks: no  -+ Stage III CKD; last BUN/creatinine:  Lab Results  Component Value Date   BUN 21 11/23/2017   BUN 20 11/05/2017   CREATININE 1.10 (H) 11/23/2017   CREATININE 1.06 (H) 11/05/2017   Lab Results  Component Value Date   GFRAA 58 (L) 11/23/2017   GFRAA 62 11/05/2017   GFRAA 57 (L) 10/24/2016   GFRAA 57 (L) 06/30/2016   GFRAA 71 03/04/2016  On lisinopril 10. -+ HL; last set of lipids: Lab Results  Component Value Date   CHOL 147 11/05/2017   HDL 59 11/05/2017   LDLCALC 61 11/05/2017   TRIG 133 11/05/2017   CHOLHDL 2.5 11/05/2017  On lovastatin 40. - last eye exam was in 11/2017: + DR.  She has history of cataract surgery OU. -+ Numbness and tingling in her feet.  On Neurontin 300 mg 3 times a day On ASA 81.  Low vitamin B12:  Reviewed her B12 levels: Lab Results  Component Value Date   VITAMINB12 261 03/16/2018   VITAMINB12 300 04/14/2017   VITAMINB12 478 10/25/2015  05/01/2016: Vit B12 248.  She continues on 5000 mcg B12 daily.  She refused IM injections.  She continues to be in  a wheelchair as her left leg is very weak.  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  She had surgery in 2016 but strength did not improve.  ROS: Constitutional: no weight gain/+ weight loss (per review of the chart she lost 12 pounds-she does not check her sugars at home), +  fatigue, no subjective hyperthermia, no subjective hypothermia Eyes: no blurry vision, no xerophthalmia ENT: no sore throat, no nodules palpated in neck, no dysphagia, no odynophagia, no hoarseness Cardiovascular: no CP/no SOB/no palpitations/no leg swelling Respiratory: no cough/no SOB/no wheezing Gastrointestinal: no N/no V/no D/no C/no acid reflux Musculoskeletal: no muscle aches/no joint aches Skin: no rashes, no hair loss Neurological: no tremors/no numbness/no tingling/no  dizziness  I reviewed pt's medications, allergies, PMH, social hx, family hx, and changes were documented in the history of present illness. Otherwise, unchanged from my initial visit note.  Past Medical History:  Diagnosis Date  . Elevated LFTs   . Fatty liver    on [place].   Marland Kitchen Hyperlipemia   . Hypertension   . Uncontrolled type 2 diabetes with neuropathy [facility])    Past Surgical History:  Procedure Laterality Date  . APPENDECTOMY  1969  . BREAST BIOPSY Right   . CATARACT EXTRACTION     May 21016  . LUMBAR DISC SURGERY    . LUMBAR LAMINECTOMY/DECOMPRESSION MICRODISCECTOMY N/A 07/06/2015   Procedure: Thoracic ten- eleven laminectomy for spinal canal decompression;  Surgeon: Ashok Pall, MD;  Location: [place] [facility];  Service: Orthopedics;  Laterality: N/A;   Social History   Social History  . Marital status: Divorced    Spouse name: N/A  . Number of children: 0   Occupational History  . None   Social History Main Topics  . Smoking status: Never Smoker  . Smokeless tobacco: Never Used  . Alcohol use No  . Drug use: No   Current Outpatient Medications on File Prior to Visit  Medication Sig Dispense Refill  . ACCU-CHEK SOFTCLIX LANCETS lancets Test 3 times daily. Pt uses accu-chek softclix lancing device 200 each 3  . acetaminophen (ACETAMINOPHEN 8 HOUR) 650 MG CR tablet Take 650 mg by mouth every 8 (eight) hours as needed for pain.    Marland Kitchen albuterol (PROVENTIL HFA) 108 (90 Base) MCG/ACT inhaler Inhale into the lungs.    Marland Kitchen amLODipine (NORVASC) 10 MG tablet TAKE 1 TABLET BY MOUTH EVERY MORNING 90 tablet 0  . aspirin EC 81 MG tablet Take 81 mg by mouth every evening.    . Blood Glucose Monitoring Suppl (ACCU-CHEK AVIVA PLUS) w/Device KIT Use as advised 1 kit 1  . Cholecalciferol (VITAMIN D PO) Take 1 tablet by mouth daily.    . Dulaglutide (TRULICITY) 1.5 UU/8.2CM SOPN Inject 1.5 mg into the skin once a week. 4 pen 11  . gabapentin (NEURONTIN) 300 MG capsule Take 1 capsule (300 mg  total) by mouth 3 (three) times daily. 270 capsule 0  . glucose blood (ACCU-CHEK AVIVA PLUS) test strip Use as instructed 4x a day 200 each 11  . hydrochlorothiazide (MICROZIDE) 12.5 MG capsule TAKE 1 CAPSULE(12.5 MG) BY MOUTH DAILY 90 capsule 0  . insulin lispro (HUMALOG KWIKPEN) 100 UNIT/ML KwikPen Inject 0.05-0.07 mLs (5-7 Units total) into the skin 3 (three) times daily with meals. 15 mL 11  . Insulin Pen Needle (CAREFINE PEN NEEDLES) 32G X 4 MM MISC Use 4x a day 300 each 3  . lisinopril (PRINIVIL,ZESTRIL) 10 MG tablet TAKE 1 TABLET BY MOUTH EVERY MORNING  90 tablet 0  . lovastatin (MEVACOR) 40 MG tablet Take 1 tablet (40 mg total) by mouth at bedtime. 90 tablet 3  . metFORMIN (GLUCOPHAGE-XR) 500 MG 24 hr tablet TAKE 1 TABLET(500 MG) BY MOUTH TWICE DAILY WITH A MEAL 180 tablet 0  . polyethylene glycol (MIRALAX / GLYCOLAX) packet Take 17 g by mouth daily as needed for mild constipation. 14 each 0  . TOUJEO SOLOSTAR 300 UNIT/ML SOPN Inject 30 Units into the skin at bedtime. 45 mL 3   No current facility-administered medications on file prior to visit.    Allergies  Allergen Reactions  . Other     PT PREFERS TO NOT HAVE ANY NARCOTIC MEDICATIONS  . Chlorhexidine Rash    Burning after using CHG wipes-used for surgery  . Oxycodone Hcl     Other reaction(s): Hallucination Marked hallucinations and palpitations following dose of 83m on 04/30/2015    Family History  Problem Relation Age of Onset  . Diabetes Mother   . Heart disease Mother   . Kidney disease Brother        Two brothers on ESRD  . Amblyopia Neg Hx   . Blindness Neg Hx   . Cataracts Neg Hx   . Glaucoma Neg Hx   . Macular degeneration Neg Hx   . Retinal detachment Neg Hx   . Strabismus Neg Hx   . Retinitis pigmentosa Neg Hx    Pt has FH of DM in M, MGM, PGM, M aunt, uncles.  PE: BP 138/60   Pulse 68   Temp 98.1 F (36.7 C)   SpO2 98%  Wt Readings from Last 3 Encounters:  06/10/18 220 lb (99.8 kg)  04/21/18 232  lb (105.2 kg)  01/19/18 232 lb (105.2 kg)   Constitutional: overweight, in NAD, in wheelchair Eyes: PERRLA, EOMI, no exophthalmos ENT: moist mucous membranes, no thyromegaly, no cervical lymphadenopathy Cardiovascular: RRR, No MRG Respiratory: CTA B Gastrointestinal: abdomen soft, NT, ND, BS+ Musculoskeletal: no deformities, strength intact in all 4 Skin: moist, warm, no rashes Neurological: no tremor with outstretched hands, DTR normal in all 4  ASSESSMENT: 1. DM2, insulin-dependent, uncontrolled, with complications - CKD stage 3 - PN - DR  - Of note, we tried to prescribe the Freestyle libre CGM for her but this was not covered by the insurance  2. Low B12  3. HL  PLAN:  1. Patient with longstanding, uncontrolled, type 2 diabetes, on oral antidiabetic regimen with metformin, long-acting insulin, weekly GLP-1 receptor agonist.  After addition of Trulicity, her sugars improved significantly and she was even having low blood sugars on the low dose of the GLP-1 receptor agonist.  She was also trying to adopt a plant-based diet which I believe contributed to her improved blood sugars.  -At last visit, her sugars were a little higher, due to stress in her family, but they were still at goal.  I advised her to decrease Humalog and to increase the dose of Trulicity.  I also advised her that if her sugars remain at goal, she could stop Humalog.  At last visit, HbA1c was slightly higher, at 6.8%, however, since this was higher than the value inferred from her sugars at home, we checked a fructosamine.  The HbA1c calculated from the fructosamine was better, at 6.28%, closer to her sugars at home. -At this visit, she is not checking sugars consistently, but she saw high blood sugars when she checked which prompted her to stop checking.  After our last visit,  she was able to decrease and then stop Humalog and she did not restart recently.  She feels that her sugars are high due to the lack of fresh  fruit and vegetables (she was mostly on a plant-based diet before).  She is ready to return to this as soon as possible.  She feels that her sugars will improve after she changes her diet.  In the meantime, I suggested to add back Humalog at a low dose. - I suggested to:  Patient Instructions  Please continue: - Metformin ER 500 mg 1-2x a day with meals. - Toujeo 30 units at night - Trulicity 1.5 mg weekly  Please start checking sugars and re- add: - Humalog 5-7 units before the meals.  Please continue B12 5000 mcg daily.  Please return in 4 months with your sugar log.   - today, HbA1c is 10.7% [facility] HIGHER!) - continue checking sugars at different times of the day - check 2-3x a day, rotating checks - advised for yearly eye exams >> she is UTD - Return to clinic in 4 mo with sugar log   2. Low B12 -Patient has a history of low vitamin D B12 -She has been on supplementation with 2000 and 5000 mcg daily -At last visit B12 was still on the lower end of normal -She confirms again today that she refuses injections so we will continue 5000 units p.o. vitamin B12 daily  3. HL - Reviewed latest lipid panel from 10/2017: At goal Lab Results  Component Value Date   CHOL 147 11/05/2017   HDL 59 11/05/2017   LDLCALC 61 11/05/2017   TRIG 133 11/05/2017   CHOLHDL 2.5 11/05/2017  - Continues Mevacor without side effects.  Philemon Kingdom, MD PhD [facility]

## 2018-07-13 ENCOUNTER — Other Ambulatory Visit: Payer: Self-pay

## 2018-07-13 ENCOUNTER — Ambulatory Visit (INDEPENDENT_AMBULATORY_CARE_PROVIDER_SITE_OTHER): Payer: Medicare Other | Admitting: Internal Medicine

## 2018-07-13 ENCOUNTER — Encounter: Payer: Self-pay | Admitting: Internal Medicine

## 2018-07-13 VITALS — BP 138/60 | HR 68 | Temp 98.1°F

## 2018-07-13 DIAGNOSIS — E1140 Type 2 diabetes mellitus with diabetic neuropathy, unspecified: Secondary | ICD-10-CM | POA: Diagnosis not present

## 2018-07-13 DIAGNOSIS — E538 Deficiency of other specified B group vitamins: Secondary | ICD-10-CM

## 2018-07-13 DIAGNOSIS — E782 Mixed hyperlipidemia: Secondary | ICD-10-CM

## 2018-07-13 DIAGNOSIS — E1165 Type 2 diabetes mellitus with hyperglycemia: Secondary | ICD-10-CM | POA: Diagnosis not present

## 2018-07-13 DIAGNOSIS — IMO0002 Reserved for concepts with insufficient information to code with codable children: Secondary | ICD-10-CM

## 2018-07-13 LAB — POCT GLYCOSYLATED HEMOGLOBIN (HGB A1C): Hemoglobin A1C: 10.7 % — AB (ref 4.0–5.6)

## 2018-07-13 NOTE — Addendum Note (Signed)
Addended by: Darliss Ridgel I on: 07/13/2018 11:46 AM   Modules accepted: Orders

## 2018-07-17 ENCOUNTER — Other Ambulatory Visit: Payer: Self-pay | Admitting: Family Medicine

## 2018-07-17 ENCOUNTER — Other Ambulatory Visit: Payer: Self-pay

## 2018-07-17 ENCOUNTER — Ambulatory Visit
Admission: RE | Admit: 2018-07-17 | Discharge: 2018-07-17 | Disposition: A | Discharge status: Home / Self Care | Payer: Medicare Other | Source: Ambulatory Visit | Attending: Family Medicine | Admitting: Family Medicine

## 2018-07-17 DIAGNOSIS — R921 Mammographic calcification found on diagnostic imaging of breast: Secondary | ICD-10-CM

## 2018-07-21 ENCOUNTER — Telehealth: Payer: Self-pay

## 2018-07-21 ENCOUNTER — Encounter: Payer: Self-pay | Admitting: Physical Medicine & Rehabilitation

## 2018-07-21 ENCOUNTER — Other Ambulatory Visit: Payer: Self-pay

## 2018-07-21 ENCOUNTER — Encounter: Payer: Medicare Other | Attending: Physical Medicine & Rehabilitation | Admitting: Physical Medicine & Rehabilitation

## 2018-07-21 VITALS — BP 134/68 | HR 67 | Temp 98.0°F | Wt 220.0 lb

## 2018-07-21 DIAGNOSIS — M4714 Other spondylosis with myelopathy, thoracic region: Secondary | ICD-10-CM | POA: Insufficient documentation

## 2018-07-21 DIAGNOSIS — G8220 Paraplegia, unspecified: Secondary | ICD-10-CM | POA: Insufficient documentation

## 2018-07-21 NOTE — Progress Notes (Signed)
Subjective:    Patient ID: Cassandra Stewart, female    DOB: Jan 15, 1951, 68 y.o.   MRN: [id number]  HPI Takes Tylenol arthritis 650mg  3 times a day Gabapentin 300mg  TID working ok without excesive sedation   Endocrine recommend Vit D and B supplements  Has a power chair that she cannot use at home due to home layout  Uses manual chair at home but armrests are torn The seatback form manual chair is ripped, wheels are warped Has had this chair 5 years ago while in nursing home  Pt is able manually propel chair Able to transfer from bed to chair , chair to toilet and other surfaces independantly     Pain Inventory Average Pain 6 Pain Right Now 4 My pain is aching  In the last 24 hours, has pain interfered with the following? General activity 8 Relation with others 8 Enjoyment of life 8 What TIME of day is your pain at its worst? daytime Sleep (in general) Fair  Pain is worse with: walking and standing Pain improves with: medication Relief from Meds: 5  Mobility walk with assistance use a walker ability to climb steps?  no do you drive?  no use a wheelchair transfers alone  Function disabled: date disabled na  Neuro/Psych weakness numbness trouble walking  Prior Studies Any changes since last visit?  no  Physicians involved in your care Any changes since last visit?  no   Family History  Problem Relation Age of Onset  . Diabetes Mother   . Heart disease Mother   . Kidney disease Brother        Two brothers on ESRD  . Amblyopia Neg Hx   . Blindness Neg Hx   . Cataracts Neg Hx   . Glaucoma Neg Hx   . Macular degeneration Neg Hx   . Retinal detachment Neg Hx   . Strabismus Neg Hx   . Retinitis pigmentosa Neg Hx    Social History   Socioeconomic History  . Marital status: Divorced    Spouse name: Not on file  . Number of children: Not on file  . Years of education: Not on file  . Highest education level: Not on file  Occupational History   . Not on file  Social Needs  . Financial resource strain: Not on file  . Food insecurity:    Worry: Not on file    Inability: Not on file  . Transportation needs:    Medical: Not on file    Non-medical: Not on file  Tobacco Use  . Smoking status: Never Smoker  . Smokeless tobacco: Never Used  Substance and Sexual Activity  . Alcohol use: No  . Drug use: No  . Sexual activity: Not Currently  Lifestyle  . Physical activity:    Days per week: Not on file    Minutes per session: Not on file  . Stress: Not on file  Relationships  . Social connections:    Talks on phone: Not on file    Gets together: Not on file    Attends religious service: Not on file    Active member of club or organization: Not on file    Attends meetings of clubs or organizations: Not on file    Relationship status: Not on file  Other Topics Concern  . Not on file  Social History Narrative  . Not on file   Past Surgical History:  Procedure Laterality Date  . APPENDECTOMY  1969  . BREAST  BIOPSY Right   . CATARACT EXTRACTION     May [id number]  . LUMBAR DISC SURGERY    . LUMBAR LAMINECTOMY/DECOMPRESSION MICRODISCECTOMY N/A 07/06/2015   Procedure: Thoracic ten- eleven laminectomy for spinal canal decompression;  Surgeon: Coletta MemosKyle Cabbell, MD;  Location: [facility];  Service: Orthopedics;  Laterality: N/A;   Past Medical History:  Diagnosis Date  . Elevated LFTs   . Fatty liver    on US.   Marland Kitchen. Hyperlipemia   . Hypertension   . Uncontrolled type 2 diabetes with neuropathy (HCC)    BP 134/68   Pulse 67   Temp 98 F (36.7 C)   Wt 220 lb (99.8 kg) Comment: pt reported, in wheelchair  SpO2 96%   BMI 37.18 kg/m   Opioid Risk Score:   Fall Risk Score:  `1  Depression screen PHQ 2/9  [facility] 11/05/2017 10/21/2017 09/09/2017 08/05/2016 08/01/2016 07/29/2016 10/25/2015  Decreased Interest 0 0 0 0 0 0 0  Down, Depressed, Hopeless 3 0 0 0 0 0 0  PHQ - 2 Score 3 0 0 0 0 0 0   Review of Systems   Constitutional: Negative.   HENT: Negative.   Eyes: Negative.   Respiratory: Negative.   Cardiovascular: Negative.   Gastrointestinal: Positive for abdominal pain.  Endocrine: Negative.   Genitourinary: Negative.   Musculoskeletal: Negative.   Skin: Negative.   Allergic/Immunologic: Negative.   Neurological: Negative.   Hematological: Negative.   Psychiatric/Behavioral: Negative.   All other systems reviewed and are negative.      Objective:   Physical Exam Vitals signs and nursing note reviewed.  Constitutional:      Appearance: Normal appearance. She is obese.  HENT:     Head: Normocephalic and atraumatic.     Nose: Nose normal.     Mouth/Throat:     Mouth: Mucous membranes are moist.  Eyes:     Extraocular Movements: Extraocular movements intact.     Conjunctiva/sclera: Conjunctivae normal.     Pupils: Pupils are equal, round, and reactive to light.  Skin:    General: Skin is warm and dry.  Neurological:     General: No focal deficit present.     Mental Status: She is alert and oriented to person, place, and time.  Psychiatric:        Mood and Affect: Mood normal.        Behavior: Behavior normal.   Motor is 5/5 bilateral deltoid bicep tricep grip Right lower extremity is 4- at the hip flexor knee extensor ankle dorsiflexor left lower extremity is 3- left hip flexor knee extensor ankle dorsiflexor Negative straight leg raising test No pain with lower extremity range of motion  Upper extremity coordination is normal         Assessment & Plan:  1.  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  She has asymmetric weakness which has been stable.  Left lower extremity is weaker than right lower extremity. She is able to independently propel a wheelchair, her upper extremity function is normal Her home does not accommodate the power chair. Her current manual chair is in very poor repair and is not safe to use. I have ordered a new manual chair. Nurse  practitioner visit in 3 months

## 2018-07-21 NOTE — Telephone Encounter (Signed)
Patient came in today for f/u with Dr. Wynn Banker and she informed me that she has not heard anything about her manual wheelchair. There was a referral sent over to [facility] for PT to eval for manual wheelchair. I called [facility] and spoke to Angie. Angie informed me that [facility]) was suppose to contact [facility] and set up an appt for the eval. Angie made an appt for the patient and states she will contact [facility] to coordinate the appt. If it does not work pt will be contacted and rescheduled. Patient has been informed.

## 2018-07-21 NOTE — Patient Instructions (Signed)
Please wash RIght toe abrasion with soap and water , dry thoroughly, cover with triple antibiotic ointment and a bandage, repeat daily Avoid shoe wear, may use clean non stick socks

## 2018-07-27 DIAGNOSIS — M4804 Spinal stenosis, thoracic region: Secondary | ICD-10-CM | POA: Diagnosis not present

## 2018-07-27 DIAGNOSIS — G8220 Paraplegia, unspecified: Secondary | ICD-10-CM | POA: Diagnosis not present

## 2018-07-27 DIAGNOSIS — G992 Myelopathy in diseases classified elsewhere: Secondary | ICD-10-CM | POA: Diagnosis not present

## 2018-07-27 DIAGNOSIS — L8995 Pressure ulcer of unspecified site, unstageable: Secondary | ICD-10-CM | POA: Diagnosis not present

## 2018-08-10 ENCOUNTER — Ambulatory Visit: Payer: Medicare Other | Attending: Registered Nurse | Admitting: Physical Therapy

## 2018-08-10 ENCOUNTER — Other Ambulatory Visit: Payer: Self-pay

## 2018-08-10 ENCOUNTER — Encounter: Payer: Self-pay | Admitting: Physical Therapy

## 2018-08-10 DIAGNOSIS — R293 Abnormal posture: Secondary | ICD-10-CM | POA: Diagnosis not present

## 2018-08-10 DIAGNOSIS — G8929 Other chronic pain: Secondary | ICD-10-CM | POA: Diagnosis not present

## 2018-08-10 DIAGNOSIS — R208 Other disturbances of skin sensation: Secondary | ICD-10-CM

## 2018-08-10 DIAGNOSIS — M545 Low back pain, unspecified: Secondary | ICD-10-CM

## 2018-08-10 DIAGNOSIS — M6281 Muscle weakness (generalized): Secondary | ICD-10-CM | POA: Diagnosis not present

## 2018-08-10 DIAGNOSIS — M25511 Pain in right shoulder: Secondary | ICD-10-CM | POA: Insufficient documentation

## 2018-08-10 DIAGNOSIS — R29818 Other symptoms and signs involving the nervous system: Secondary | ICD-10-CM

## 2018-08-10 DIAGNOSIS — G8222 Paraplegia, incomplete: Secondary | ICD-10-CM | POA: Diagnosis not present

## 2018-08-10 DIAGNOSIS — M25512 Pain in left shoulder: Secondary | ICD-10-CM | POA: Insufficient documentation

## 2018-08-10 NOTE — Therapy (Signed)
[facility] [address] [place], [place], [address] Phone: [phone]   Fax:  [phone]  Physical Therapy Evaluation  Patient Details  Name: Cassandra Stewart MRN: [id number] Date of Birth: [birth year] Referring Provider (PT): Bayard Hugger, NP   Encounter Date: 08/10/2018   CLINIC OPERATION CHANGES: Outpatient [facility] is open at lower capacity following universal masking, social distancing, and patient screening.  The patient's COVID risk of complications score is 4.   PT End of Session - 08/10/18 1259    Visit Number  1    Number of Visits  1    Date for PT Re-Evaluation  08/10/18    Authorization Type  [employer] Medicare    PT Start Time  1300    PT Stop Time  1400    PT Time Calculation (min)  60 min    Activity Tolerance  Patient tolerated treatment well    Behavior During Therapy  WFL for tasks assessed/performed       Past Medical History:  Diagnosis Date  . Elevated LFTs   . Fatty liver    on [place].   Marland Kitchen Hyperlipemia   . Hypertension   . Uncontrolled type 2 diabetes with neuropathy [facility])     Past Surgical History:  Procedure Laterality Date  . APPENDECTOMY  1969  . BREAST BIOPSY Right   . CATARACT EXTRACTION     May 21016  . LUMBAR DISC SURGERY    . LUMBAR LAMINECTOMY/DECOMPRESSION MICRODISCECTOMY N/A 07/06/2015   Procedure: Thoracic ten- eleven laminectomy for spinal canal decompression;  Surgeon: Ashok Pall, MD;  Location: [facility];  Service: Orthopedics;  Laterality: N/A;    There were no vitals filed for this visit.   Subjective Assessment - 08/10/18 1257    Subjective  Pt referred for evaluation for new wheelchair.  Pt has [employer] power mid-wheel drive wheelchair she uses in the community but is unable to use it safely in her home due to front foot plate/casters and lay out of her home.  Has a manual wheelchair that is >70 years old and is in poor condition.    Pertinent History  CKD -  stage 3, thoracic spinal stenosis, morbid obesity, inability to walk, Fatty liver disease, hyperlipidemia, HTN, uncontrolled type 2 DM with neuropathy    Patient Stated Goals  to obtain a new manual w/c    Currently in Pain?  Yes         [facility] PT Assessment - 08/10/18 1420      Assessment   Medical Diagnosis  Thoracic spondylosis with myelopathy    Referring Provider (PT)  Bayard Hugger, NP    Onset Date/Surgical Date  07/21/18    Hand Dominance  Right    Prior Therapy  yes at SNF      Precautions   Precautions  Other (comment)    Precaution Comments  CKD - stage 3, thoracic spinal stenosis, morbid obesity, inability to walk, Fatty liver disease, hyperlipidemia, HTN, uncontrolled type 2 DM with neuropathy      Balance Screen   Has the patient fallen in the past 6 months  No      Prior Function   Level of Independence  Requires assistive device for independence;Independent with transfers;Independent with homemaking with wheelchair;Independent with community mobility with device;Independent with household mobility with device;Independent with basic ADLs      Observation/Other Assessments   Focus on Therapeutic Outcomes (FOTO)   N/A  Mobility/Seating Evaluation    PATIENT INFORMATION: Name: Cassandra Stewart DOB: [birth year]  Sex: Female Date seen: 08/10/2018 Time: 13:00  Address:  [address]  [address]  [place] [address] Physician: Bayard Hugger, NP This evaluation/justification form will serve as the LMN for the following suppliers: __________________________ Supplier: [employer] Contact Person: Luz Brazen, ATP Phone:  ?????   Seating Therapist: Misty Stanley, PT, DPT Phone:   [phone]   Phone: [phone]     Spouse/Parent/Caregiver name: N/A  Phone number: N/A Insurance/Payer: [facility] Medicare     Reason for Referral: More appropriate wheelchair for in home use  Patient/Caregiver Goals: To maintain independent and safe mobility in her home;  unable to use current power wheelchair due to layout of home.  Manual w/c is in disrepair and not appropriate for daily use   Patient was seen for face-to-face evaluation for new seating system modification.  Also present was [employer], ATP to discuss recommendations and wheelchair options.  Further paperwork was completed and sent to vendor.  Patient appears to qualify for power mobility device at this time per objective findings.   MEDICAL HISTORY: Diagnosis: Primary Diagnosis: Thoracic Spondylosis with Myelopathy Onset: 2014 Diagnosis: Paraparesis   [] Progressive Disease Relevant past and future surgeries: T10 - T11 lumbar laminectomy and decompression with microdiscectomy (2017)   Height: 5' 4"  Weight: 220 lb Explain recent changes or trends in weight: ?????   History including Falls: 1 fall in bathroom, CKD - stage 3, thoracic spinal stenosis, morbid obesity, inability to walk, Fatty liver disease, hyperlipidemia, HTN, uncontrolled type 2 DM with neuropathy    HOME ENVIRONMENT: [] House  [] Condo/town home  [x] Apartment  [] Assisted Living    [x] Lives Alone []  Lives with Others                                                                                          Hours with caregiver: ?????  [x] Home is accessible to patient           Stairs      [] Yes [x]  No     Ramp [] Yes [x] No Comments:  Apartment is level entry.  Tile in kitchen and bathroom/dining area, low carpet in bedroom and living room.  Due to her current power wheelchair being mid-wheel drive she is unable to get close enough to counter tops, stove or bathroom surfaces due to front casters and length of foot pate.  When using current power wheelchair in her kitchen she injured her L foot - foot jammed into the bottom of her cabinet when trying to get close enough.  Following foot injury pt changed to using older manual wheelchair and removing leg rests to get close enough to perform transfers and to perform cooking tasks from seated  position.      COMMUNITY ADL: TRANSPORTATION: [] Car    [] Van    [x] Public Transportation    [] Adapted w/c Lift    [] Ambulance    [] Other:       [x] Sits in wheelchair during transport  Employment/School: ????? Specific requirements pertaining to mobility ?????  Other: ?????    FUNCTIONAL/SENSORY PROCESSING SKILLS:  Handedness:   [  x]Right     [] Left    [] NA  Comments:  ?????  Functional Processing Skills for Wheeled Mobility [x] Processing Skills are adequate for safe wheelchair operation  Areas of concern than may interfere with safe operation of wheelchair Description of problem   []  Attention to environment      [] Judgment      []  Hearing  []  Vision or visual processing      [] Motor Planning  []  Fluctuations in Behavior  ?????    VERBAL COMMUNICATION: [x] WFL receptive [x]  WFL expressive [] Understandable  [] Difficult to understand  [] non-communicative []  Uses an augmented communication device  CURRENT SEATING / MOBILITY: Current Mobility Base:  [] None [] Dependent [x] Manual [] Scooter [x] Power  Type of Control: Joystick  Manufacturer:  Music therapist mid-wheel drive group 2 power wheelchair; General use manual wheelchair Size:  20 x 18 Jazzy; size of manual chair unknown Age: 63.3 years for scooter; >5 years for manual w/c  Current Condition of Mobility Base:  manual wheelchair condition is poor; Arm rests are missing padding, seat back is ripped, wheels are warped seat sling is ripping, had to add a pillow for further cushioning.  Terrilee Files is in good condition     Current Wheelchair components:  Manual wheelchair - general sling back and seat sling, leg rests.  Jazzy - captain's seat/back, center flip up foot plate, flip back arm rests, seat belt, R joystick, head rest  Describe posture in present seating system:  bilat hips ABD, posterior pelvic tilt causing sacral sitting, thoracic kyphosis, rounded shoulders, forward head      SENSATION and SKIN ISSUES: Sensation [] Intact  [x] Impaired  [x] Absent  Level of sensation: S3 level; impaired due to paraparesis and peripheral neuropathy Pressure Relief: Able to perform effective pressure relief :    [] Yes  [x]  No Method: ???? If not, Why?: lacks sufficient strength in UE to perform wheelchair pushup, unable to laterally lean and unable to stand long enough to sufficiently off weight hips/pelvis  Skin Issues/Skin Integrity Current Skin Issues  [] Yes [x] No [x] Intact []  Red area[]  Open Area  [] Scar Tissue [x] At risk from prolonged sitting Where  ?????  History of Skin Issues  [x] Yes [] No Where  Sacrum/Coccyx When  2017  Hx of skin flap surgeries  [] Yes [x] No Where  ????? When  ?????  Limited sitting tolerance [] Yes [x] No Hours spent sitting in wheelchair daily: > 8 hours a day  Complaint of Pain:  Please describe: Pain in low back, pain in bilateral shoulders, pain in sacrum/buttocks from prolonged sitting    Swelling/Edema: L foot edema    ADL STATUS (in reference to wheelchair use):  Indep Assist Unable Indep with Equip Not assessed Comments  Dressing ????? ????? ????? X ????? seated in wheelchair  Eating ????? ????? ????? X ????? wheelchair level  Toileting ????? ????? ????? X ????? transfers from wheelchair to toilet  Bathing ????? ????? ????? X ????? transfers from wheelchair tub bench  Grooming/Hygiene ????? ????? ????? X ????? wheelchair level  Meal Prep ????? ????? ????? X ????? wheelchair level  IADLS ????? ????? ????? X ????? wheelchair level   Bowel Management: [x] Continent  [] Incontinent  [] Accidents Comments:  ?????  Bladder Management: [x] Continent  [] Incontinent  [] Accidents Comments:  ?????     WHEELCHAIR SKILLS: Manual w/c Propulsion: [] UE or LE strength and endurance sufficient to participate in ADLs using manual wheelchair Arm : [] left [] right   [] Both      Distance: ????? Foot:  [] left [] right   [] Both  Operate Scooter: []   Strength, hand grip, balance and transfer appropriate for use [] Living  environment is accessible for use of scooter  Operate Power w/c:  [x]  Std. Joystick   []  Alternative Controls Indep [x]  Assist []  Dependent/unable []  N/A []   [x] Safe          [x]  Functional      Distance: >2,000 feet  Bed confined without wheelchair [x]  Yes []  No   STRENGTH/RANGE OF MOTION:  N/A Range of Motion Strength  Shoulder N/A N/A  Elbow N/A N/A  Wrist/Hand N/A N/A  Hip N/A N/A  Knee N/A N/A  Ankle N/A N/A     MOBILITY/BALANCE:  []  Patient is totally dependent for mobility  ?????    Balance Transfers Ambulation  Sitting Balance: Standing Balance: [x]  Independent []  Independent/Modified Independent  [x]  WFL     []  WFL []  Supervision []  Supervision  []  Uses UE for balance  []  Supervision []  Min Assist []  Ambulates with Assist  ?????    []  Min Assist [x]  Min assist []  Mod Assist []  Ambulates with Device:      []  RW  []  StW  []  Cane  []  ?????  []  Mod Assist []  Mod assist []  Max assist   []  Max Assist []  Max assist []  Dependent []  Indep. Short Distance Only  []  Unable []  Unable []  Lift / Sling Required Distance (in feet)  ?????   []  Sliding board [x]  Unable to Ambulate (see explanation below)  Cardio Status:  [x] Intact  []  Impaired   []  NA     ?????  Respiratory Status:  [x] Intact   [] Impaired   [] NA     ?????  Orthotics/Prosthetics: N/A  Comments (Address manual vs power w/c vs scooter): Patient currently owns a general use manual wheelchair that is greater than 86 years old and a [employer] group 2 power wheelchair with captain's seat.  Pt would not be appropriate for a new manual wheelchair due to size and weight of manual wheelchair and pt lacks sufficient UE strength and endurance to propel a manual wheelchair functionally in her home.  Pt does not qualify for a new power wheelchair due to age of her current power wheelchair and she has not experienced a significant change in medical status.  Pt's current power wheelchair does limit her ability to safely perform ADL's at an  independent level and does not provide pt with sufficient pressure relief.  Pt would benefit from a same for same trade of her mid-wheeled drive power mobility base to a front-wheel drive power mobility base to eliminate front casters and allow for a wider flip up foot plate.  This will allow pt to independently flip up foot plate and bring wheelchair closer to counter tops and surfaces she is transferring to without risk for falling and risk of injury to either LE.  Patient would also benefit from replacement of current seating system to rehab seating system with skin protection cushion to provide improved pressure distribution and pressure relief due to prolonged sitting and impaired ability to perform effective pressure relief.  Addition of a L lateral thigh guide will improve LLE positioning to neutral and decrease risk of injury to LLE.         Anterior / Posterior Obliquity Rotation-Pelvis ?????  PELVIS    []  [x]  []   Neutral Posterior Anterior  [x]  []  []   WFL Rt elev Lt elev  [x]  []  []   WFL Right Left  Anterior    Anterior     []  Fixed []  Other [x]  Partly Flexible []  Flexible   []  Fixed []  Other []  Partly Flexible  []  Flexible  []  Fixed []  Other []  Partly Flexible  []  Flexible   TRUNK  []  [x]  [x]   WFL ? Thoracic ? Lumbar  Kyphosis Lordosis  [x]  []  []   WFL Convex Convex  Right Left [] c-curve [] s-curve [] multiple  [x]  Neutral []  Left-anterior []  Right-anterior     []  Fixed []  Flexible [x]  Partly Flexible []  Other  []  Fixed []  Flexible []  Partly Flexible []  Other  []  Fixed             []  Flexible []  Partly Flexible []  Other    Position Windswept  ?????  HIPS          []            [x]               []    Neutral       Abduct        ADduct         [x]           []            []   Neutral Right           Left      []  Fixed []  Subluxed [x]  Partly Flexible []  Dislocated []  Flexible  []  Fixed []  Other []  Partly Flexible  []  Flexible                  Foot Positioning Knee Positioning  ?????    [x]  WFL  [] Lt [] Rt [x]  WFL  [] Lt [] Rt    KNEES ROM concerns: ROM concerns:    & Dorsi-Flexed [] Lt [] Rt ?????    FEET Plantar Flexed [] Lt [] Rt      Inversion                 [] Lt [] Rt      Eversion                 [] Lt [] Rt     HEAD [x]  Functional [x]  Good Head Control  ?????  & []  Flexed         []  Extended []  Adequate Head Control    NECK []  Rotated  Lt  []  Lat Flexed Lt []  Rotated  Rt []  Lat Flexed Rt []  Limited Head Control     []  Cervical Hyperextension []  Absent  Head Control     SHOULDERS ELBOWS WRIST& HAND ?????      Left     Right    Left     Right    Left     Right   U/E [] Functional           [] Functional ????? ????? [] Fisting             [] Fisting      [] elev   [] dep      [x] elev   [] dep       [x] pro -[] retract     [x] pro  [] retract [] subluxed             [] subluxed           Goals for Wheelchair Mobility  [x]  Independence with mobility in the home with motor related ADLs (MRADLs)  [x]  Independence with MRADLs in the community []  Provide dependent mobility  []  Provide recline     [] Provide tilt   Goals  for Seating system [x]  Optimize pressure distribution [x]  Provide support needed to facilitate function or safety [x]  Provide corrective forces to assist with maintaining or improving posture []  Accommodate client's posture:   current seated postures and positions are not flexible or will not tolerate corrective forces [x]  Client to be independent with relieving pressure in the wheelchair [] Enhance physiological function such as breathing, swallowing, digestion  Simulation ideas/Equipment trials:Performed trial of front-wheel drive power wheelchair.  Pt observed making left and right turns, navigating around furniture, through multiple doorways and making 180 deg turns in narrow spaces safely.   Patient taken into practice kitchen and simulated positioning herself in front of stove and sink - pt was able to independently lift  foot plate to bring herself closer to counter tops.  Pt was also able to approach counter top with foot plate down and position feet back on foot plate to allow foot plate to go under counter top, also bringing her close enough to perform cooking tasks and reach sink.  Patient also demonstrated safe transfers in and out of front-wheel drive power wheelchair State why other equipment was unsuccessful:?????   MOBILITY BASE RECOMMENDATIONS and JUSTIFICATION: [place]  Manufacturer: ?????Model: ?????   Size: Width ?????Seat Depth ????? [] provide transport from point A to B      [] promote Indep mobility  [] is not a safe, functional ambulator [] walker or cane inadequate [] non-standard width/depth necessary to accommodate anatomical measurement []  ?????  [] Manual Mobility Base [] non-functional ambulator    [] Scooter/POV  [] can safely operate  [] can safely transfer   [] has adequate trunk stability  [] cannot functionally propel manual w/c  [] Power Mobility Base  [] non-ambulatory  [] cannot functionally propel manual wheelchair  []  cannot functionally and safely operate scooter/POV [] can safely operate and willing to  [] Stroller Base [] infant/child  [] unable to propel manual wheelchair [] allows for growth [] non-functional ambulator [] non-functional UE [] Indep mobility is not a goal at this time  [] Tilt  [] Forward [] Backward [] Powered tilt  [] Manual tilt  [] change position against gravitational force on head and shoulders  [] change position for pressure relief/cannot weight shift [] transfers  [] management of tone [] rest periods [] control edema [] facilitate postural control  []  ?????  [] Recline  [] Power recline on power base [] Manual recline on manual base  [] accommodate femur to back angle  [] bring to full recline for ADL care  [] change position for pressure relief/cannot weight shift [] rest periods [] repositioning for transfers or clothing/diaper /catheter  changes [] head positioning  [] Lighter weight required [] self- propulsion  [] lifting []  ?????  [] Heavy Duty required [] user weight greater than 250# [] extreme tone/ over active movement [] broken frame on previous chair []  ?????  []  Back  []  Angle Adjustable []  Custom molded ????? [] postural control [] control of tone/spasticity [] accommodation of range of motion [] UE functional control [] accommodation for seating system []  ????? [] provide lateral trunk support [] accommodate deformity [] provide posterior trunk support [] provide lumbar/sacral support [] support trunk in midline [] Pressure relief over spinal processes  [x]  Seat Cushion Rehab seating with skin protection cushion [x] impaired sensation  [] decubitus ulcers present [x] history of pressure ulceration [] prevent pelvic extension [x] low maintenance  [x] stabilize pelvis  [] accommodate obliquity [] accommodate multiple deformity [x] neutralize lower extremity position [x] increase pressure distribution []  ?????  [x]  Pelvic/thigh support  [x]  Lateral thigh guide []  Distal medial pad  [x]  Distal lateral pad []  pelvis in neutral [] accommodate pelvis []  position upper legs [x]  alignment []  accommodate ROM []  decr adduction [] accommodate tone [x] removable for transfers [x] decr abduction  []  Lateral trunk Supports []  Lt     []   Rt [] decrease lateral trunk leaning [] control tone [] contour for increased contact [] safety  [] accommodate asymmetry []  ?????  [x]  Mounting hardware  [] lateral trunk supports  [] back   [] seat [] headrest      [x]  thigh support [] fixed   [x] swing away [] attach seat platform/cushion to w/c frame [] attach back cushion to w/c frame [] mount postural supports [] mount headrest  [] swing medial thigh support away [x] swing lateral supports away for transfers  []  ?????    Armrests  [] fixed [] adjustable height [] removable   [] swing away  [] flip back   [] reclining [] full length pads [] desk    [] pads tubular   [] provide support with elbow at 90   [] provide support for w/c tray [] change of height/angles for variable activities [] remove for transfers [] allow to come closer to table top [] remove for access to tables []  ?????  Hangers/ Leg rests  [] 60 [] 70 [] 90 [] elevating [] heavy duty  [] articulating [] fixed [] lift off [] swing away     [] power [] provide LE support  [] accommodate to hamstring tightness [] elevate legs during recline   [] provide change in position for Legs [] Maintain placement of feet on footplate [] durability [] enable transfers [] decrease edema [] Accommodate lower leg length []  ?????  Foot support Footplate    [] Lt  []  Rt  []  Center mount [] flip up     [] depth/angle adjustable [] Amputee adapter    []  Lt     []  Rt [] provide foot support [] accommodate to ankle ROM [] transfers [] Provide support for residual extremity []  allow foot to go under wheelchair base []  decrease tone  []  ?????  []  Ankle strap/heel loops [] support foot on foot support [] decrease extraneous movement [] provide input to heel  [] protect foot  Tires: [] pneumatic  [] flat free inserts  [] solid  [] decrease maintenance  [] prevent frequent flats [] increase shock absorbency [] decrease pain from road shock [] decrease spasms from road shock []  ?????  []  Headrest  [] provide posterior head support [] provide posterior neck support [] provide lateral head support [] provide anterior head support [] support during tilt and recline [] improve feeding   [] improve respiration [] placement of switches [] safety  [] accommodate ROM  [] accommodate tone [] improve visual orientation  []  Anterior chest strap []  Vest []  Shoulder retractors  [] decrease forward movement of shoulder [] accommodation of TLSO [] decrease forward movement of trunk [] decrease shoulder elevation [] added abdominal support [] alignment [] assistance with shoulder control  []  ?????  Pelvic Positioner [] Belt [] SubASIS bar [] Dual Pull [] stabilize  tone [] decrease falling out of chair/ **will not Decr potential for sliding due to pelvic tilting [] prevent excessive rotation [] pad for protection over boney prominence [] prominence comfort [] special pull angle to control rotation []  ?????  Upper Extremity Support [] L   []  R [] Arm trough    [] hand support []  tray       [] full tray [] swivel mount [] decrease edema      [] decrease subluxation   [] control tone   [] placement for AAC/Computer/EADL [] decrease gravitational pull on shoulders [] provide midline positioning [] provide support to increase UE function [] provide hand support in natural position [] provide work surface   POWER WHEELCHAIR CONTROLS  [] Proportional  [] Non-Proportional Type ????? [] Left  [] Right [] provides access for controlling wheelchair   [] lacks motor control to operate proportional drive control [] unable to understand proportional controls  Actuator Control Module  [] Single  [] Multiple   [] Allow the client to operate the power seat function(s) through the joystick control   [] Safety Reset Switches [] Used to change modes and stop the wheelchair when driving in latch mode    [] Upgraded Electronics   [] programming for accurate control [] progressive Disease/changing  condition [] non-proportional drive control needed [] Needed in order to operate power seat functions through joystick control   [] Display box [] Allows user to see in which mode and drive the wheelchair is set  [] necessary for alternate controls    [] Digital interface electronics [] Allows w/c to operate when using alternative drive controls  [] ASL Head Array [] Allows client to operate wheelchair  through switches placed in tri-panel headrest  [] Sip and puff with tubing kit [] needed to operate sip and puff drive controls  [] Upgraded tracking electronics [] increase safety when driving [] correct tracking when on uneven surfaces  [] Mount for switches or joystick [] Attaches switches to w/c  [] Swing away for  access or transfers [] midline for optimal placement [] provides for consistent access  [] Attendant controlled joystick plus mount [] safety [] long distance driving [] operation of seat functions [] compliance with transportation regulations []  ?????    Rear wheel placement/Axle adjustability [] None [] semi adjustable [] fully adjustable  [] improved UE access to wheels [] improved stability [] changing angle in space for improvement of postural stability [] 1-arm drive access [] amputee pad placement []  ?????  Wheel rims/ hand rims  [] metal  [] plastic coated [] oblique projections [] vertical projections [] Provide ability to propel manual wheelchair  []  Increase self-propulsion with hand weakness/decreased grasp  Push handles [] extended  [] angle adjustable  [] standard [] caregiver access [] caregiver assist [] allows "hooking" to enable increased ability to perform ADLs or maintain balance  One armed device  [] Lt   [] Rt [] enable propulsion of manual wheelchair with one arm   []  ?????   Brake/wheel lock extension []  Lt   []  Rt [] increase indep in applying wheel locks   [] Side guards [] prevent clothing getting caught in wheel or becoming soiled []  prevent skin tears/abrasions  Battery: ????? [] to power wheelchair ?????  Other: ????? ????? ?????  The above equipment has a life- long use expectancy. Growth and changes in medical and/or functional conditions would be the exceptions. This is to certify that the therapist has no financial relationship with durable medical provider or manufacturer. The therapist will not receive remuneration of any kind for the equipment recommended in this evaluation.   Patient has mobility limitation that significantly impairs safe, timely participation in one or more mobility related ADL's.  (bathing, toileting, feeding, dressing, grooming, moving from room to room)                                                             [x]  Yes []  No Will mobility device sufficiently  improve ability to participate and/or be aided in participation of MRADL's?         [x]  Yes []  No Can limitation be compensated for with use of a cane or walker?                                                                                []  Yes [x]  No Does patient or caregiver demonstrate ability/potential ability & willingness to safely use the mobility device?   [x]  Yes []  No Does patient's home environment support use  of recommended mobility device?                                                    [x]  Yes []  No Does patient have sufficient upper extremity function necessary to functionally propel a manual wheelchair?    []  Yes [x]  No Does patient have sufficient strength and trunk stability to safely operate a POV (scooter)?                                  []  Yes [x]  No Does patient need additional features/benefits provided by a power wheelchair for MRADL's in the home?       [x]  Yes []  No Does the patient demonstrate the ability to safely use a power wheelchair?                                                              [x]  Yes []  No  Therapist Name Printed: Misty Stanley, PT, DPT Date: 08/10/2018  Therapist's Signature:   Date:   Supplier's Name Printed: [employer], [employer] Date: 08/10/2018  Supplier's Signature:   Date:  Patient/Caregiver Signature:   Date:     This is to certify that I have read this evaluation and do agree with the content within:      Physician's Name Printed: Bayard Hugger, NP  Physician's Signature:  Date:     This is to certify that I, the above signed therapist have the following affiliations: []  This DME provider []  Manufacturer of recommended equipment []  Patient's long term care facility [x]  None of the above              Objective measurements completed on examination: See above findings.              PT Education - 08/10/18 1259    Education Details  limitations of manual wheelchair, would not qualify for new power  wheelchair at this time due to age of current power w/c and no significant change in medical status, recommendations made to improve use and safety of current wheelchair    Person(s) Educated  Patient    Methods  Explanation    Comprehension  Verbalized understanding                  Plan - 08/10/18 1424    Clinical Impression Statement  Pt is a 68 year old female referred to Neuro [facility] for evaluation for a new wheelchair.  The following deficits were noted during pt's exam: pain in back, bilat shoulders and sacrum/coccyx, impaired strength in bilat UE and LE L>R weakness, impaired sensation, impaired posture, and impaired balance.  Pt would not be appropriate for a new manual wheelchair due to size and weight of manual wheelchair and pt lacks sufficient UE strength and endurance to propel a manual wheelchair functionally in her home.  Pt does not qualify for a new power wheelchair due to age of her current power wheelchair and she has not experienced a significant change in medical status.  Pt's current power wheelchair does limit  her ability to safely perform ADL's at an independent level and does not provide pt with sufficient pressure relief.  PT recommends changing to front wheel drive power mobility base to remove front casters and improve patient's ability to come closer to surfaces for transfers and counter tops for ADLs.  Also recommending replacing seat with rehab seat with skin protection cushion for improved pressure relief and addition of L lateral thigh guide to minimize L hip ABD.  Recommending these changes to maximize functional mobility independence and decrease risk for injury.    Personal Factors and Comorbidities  Comorbidity 3+    Comorbidities  CKD - stage 3, thoracic spinal stenosis, morbid obesity, inability to walk, Fatty liver disease, hyperlipidemia, HTN, uncontrolled type 2 DM with neuropathy    Examination-Activity Limitations  Stand;Transfers;Toileting     Examination-Participation Restrictions  Cleaning;Laundry;Meal Prep    Stability/Clinical Decision Making  Stable/Uncomplicated    Clinical Decision Making  Low    PT Frequency  One time visit    PT Duration  Other (comment)   one visit only for w/c and seating evaluation   PT Treatment/Interventions  Other (comment)   wheelchair management   Consulted and Agree with Plan of Care  Patient       Patient will benefit from skilled therapeutic intervention in order to improve the following deficits and impairments:  Decreased balance, Decreased endurance, Decreased mobility, Decreased strength, Difficulty walking, Increased edema, Impaired sensation, Impaired UE functional use, Postural dysfunction, Obesity, Pain  Visit Diagnosis: Paraplegia, incomplete (HCC)   Muscle weakness (generalized)  Abnormal posture   Other disturbances of skin sensation   Other symptoms and signs involving the nervous system  Chronic midline low back pain without sciatica  Chronic left shoulder pain  Chronic right shoulder pai     Problem List Patient Active Problem List   Diagnosis Date Noted  . Personal history of noncompliance with medical treatment, presenting hazards to health 01/29/2017  . Low serum vitamin B12 11/15/2016  . CKD (chronic kidney disease) stage 3, GFR 30-59 ml/min (HCC) 10/24/2016  . Fatty infiltration of liver 10/24/2016  . Hyperlipemia   . Estrogen deficiency 10/25/2015  . Thoracic spinal stenosis 07/06/2015  . UTI (lower urinary tract infection) 07/04/2015  . Uncontrolled type 2 diabetes with neuropathy ([place]) 07/04/2015  . Essential hypertension 07/04/2015  . Morbid obesity ([place]) 07/04/2015  . Inability to walk 07/04/2015  . Weakness of left leg 07/04/2015  . Elevated LFTs 07/04/2015    Rico Junker, PT, DPT 08/10/18    2:39 PM    [facility] [address] [place], [place], [address] Phone:  [phone]   Fax:  [phone]  Name: Nataliee Shurtz MRN: [id number] Date of Birth: 1951/02/18

## 2018-08-24 ENCOUNTER — Telehealth: Payer: Self-pay | Admitting: Internal Medicine

## 2018-08-24 NOTE — Telephone Encounter (Signed)
You want pt to do a virtual appt but she does not have a way to check her bp at home and that is what her follow-up is about. Please advise

## 2018-08-24 NOTE — Telephone Encounter (Signed)
Left message for pt to let her know Cassandra Stewart is ok doing virtual

## 2018-08-24 NOTE — Telephone Encounter (Signed)
We can use the BP that was done at Dr. Letta Pate office. It was fine.

## 2018-08-25 ENCOUNTER — Ambulatory Visit (INDEPENDENT_AMBULATORY_CARE_PROVIDER_SITE_OTHER): Payer: Medicare Other | Admitting: Family Medicine

## 2018-08-25 ENCOUNTER — Encounter: Payer: Self-pay | Admitting: Family Medicine

## 2018-08-25 ENCOUNTER — Other Ambulatory Visit: Payer: Self-pay

## 2018-08-25 VITALS — BP 134/68 | Wt 220.0 lb

## 2018-08-25 DIAGNOSIS — I10 Essential (primary) hypertension: Secondary | ICD-10-CM | POA: Diagnosis not present

## 2018-08-25 DIAGNOSIS — N183 Chronic kidney disease, stage 3 unspecified: Secondary | ICD-10-CM

## 2018-08-25 DIAGNOSIS — G8929 Other chronic pain: Secondary | ICD-10-CM

## 2018-08-25 DIAGNOSIS — Z79899 Other long term (current) drug therapy: Secondary | ICD-10-CM

## 2018-08-25 DIAGNOSIS — K760 Fatty (change of) liver, not elsewhere classified: Secondary | ICD-10-CM

## 2018-08-25 DIAGNOSIS — R29898 Other symptoms and signs involving the musculoskeletal system: Secondary | ICD-10-CM

## 2018-08-25 DIAGNOSIS — E1165 Type 2 diabetes mellitus with hyperglycemia: Secondary | ICD-10-CM

## 2018-08-25 DIAGNOSIS — E1140 Type 2 diabetes mellitus with diabetic neuropathy, unspecified: Secondary | ICD-10-CM

## 2018-08-25 DIAGNOSIS — R262 Difficulty in walking, not elsewhere classified: Secondary | ICD-10-CM

## 2018-08-25 DIAGNOSIS — R1032 Left lower quadrant pain: Secondary | ICD-10-CM | POA: Insufficient documentation

## 2018-08-25 DIAGNOSIS — R1031 Right lower quadrant pain: Secondary | ICD-10-CM | POA: Insufficient documentation

## 2018-08-25 DIAGNOSIS — IMO0002 Reserved for concepts with insufficient information to code with codable children: Secondary | ICD-10-CM

## 2018-08-25 HISTORY — DX: Other chronic pain: G89.29

## 2018-08-25 NOTE — Progress Notes (Signed)
Subjective:   Documentation for virtual audio and video telecommunications through [place] encounter:  The patient was located at home. 2 patient identifiers used.  The provider was located in the office. The patient did consent to this visit and is aware of possible charges through their insurance for this visit.  The other persons participating in this telemedicine service were none.    Patient ID: Cassandra Stewart, female    DOB: Jun 30, 1950, 68 y.o.   MRN: [id number]  HPI Chief Complaint  Patient presents with  . follow-up    follow-up on bp, kidney function   This is a virtual follow up on HTN and other chronic health conditions.   She rides the [employer] bus and is wheelchair bound so it is not easy for her to come into the office.  She was recently seen at Dr. Arman Filter office and her BP was in goal range.   Taking amlodipine 10 mg, HCTZ 12.5 mg and lisinopril 10 mg daily without any side effects.  Does not check her BP at home.   States eating healthy has been a challenge lately due to the pandemic and her inability to get to the grocery store.   Taking lovastatin for HL. Her lipids were last checked in 10/2017 and normal.   She has mild CKD. We are keeping an eye on this.   LLQ pain, chronic and is gradually worsening. States it is becoming more constant. States having a bowel movement makes it better.  States she has a bowel movement once or twice per week. No changes in bowel habits.  No fever, chills, unexplained weight loss, nausea, vomiting.  States she has never seen GI or had a colonoscopy.   She has a history of fatty liver disease.   She is now on a new medication for her diabetes called Trulicity and this is causing her to have a decreased appetite. She is fine with this and is hopeful she may loses weight.   Other providers:  Dr. Letta Pate is managing her back pain and left leg weakness.  Dr Christella Noa- back surgeon Dr. Cruzita Lederer is managing her diabetes and  B12 deficiency.   Taking oral B12 5000 mcg daily.   She had right breast abnormality at her previous mammogram in May 2020, probably benign. Due for 6 month recheck in November 2020.   Past Medical History:  Diagnosis Date  . Elevated LFTs   . Fatty liver    on [place].   Marland Kitchen Hyperlipemia   . Hypertension   . Uncontrolled type 2 diabetes with neuropathy [facility])    Past Surgical History:  Procedure Laterality Date  . APPENDECTOMY  1969  . BREAST BIOPSY Right   . CATARACT EXTRACTION     May 21016  . LUMBAR DISC SURGERY    . LUMBAR LAMINECTOMY/DECOMPRESSION MICRODISCECTOMY N/A 07/06/2015   Procedure: Thoracic ten- eleven laminectomy for spinal canal decompression;  Surgeon: Ashok Pall, MD;  Location: [facility];  Service: Orthopedics;  Laterality: N/A;   Current Outpatient Medications on File Prior to Visit  Medication Sig Dispense Refill  . ACCU-CHEK SOFTCLIX LANCETS lancets Test 3 times daily. Pt uses accu-chek softclix lancing device 200 each 3  . acetaminophen (ACETAMINOPHEN 8 HOUR) 650 MG CR tablet Take 650 mg by mouth every 8 (eight) hours as needed for pain.    Marland Kitchen albuterol (PROVENTIL HFA) 108 (90 Base) MCG/ACT inhaler Inhale into the lungs.    Marland Kitchen amLODipine (NORVASC) 10 MG tablet TAKE 1 TABLET BY MOUTH  EVERY MORNING 90 tablet 0  . aspirin EC 81 MG tablet Take 81 mg by mouth every evening.    . Blood Glucose Monitoring Suppl (ACCU-CHEK AVIVA PLUS) w/Device KIT Use as advised 1 kit 1  . Cholecalciferol (VITAMIN D PO) Take 1 tablet by mouth daily.    . Dulaglutide (TRULICITY) 1.5 JK/0.9FG SOPN Inject 1.5 mg into the skin once a week. 4 pen 11  . gabapentin (NEURONTIN) 300 MG capsule Take 1 capsule (300 mg total) by mouth 3 (three) times daily. 270 capsule 0  . glucose blood (ACCU-CHEK AVIVA PLUS) test strip Use as instructed 4x a day 200 each 11  . hydrochlorothiazide (MICROZIDE) 12.5 MG capsule TAKE 1 CAPSULE(12.5 MG) BY MOUTH DAILY 90 capsule 0  . insulin lispro (HUMALOG KWIKPEN)  100 UNIT/ML KwikPen Inject 0.05-0.07 mLs (5-7 Units total) into the skin 3 (three) times daily with meals. 15 mL 11  . Insulin Pen Needle (CAREFINE PEN NEEDLES) 32G X 4 MM MISC Use 4x a day 300 each 3  . lisinopril (PRINIVIL,ZESTRIL) 10 MG tablet TAKE 1 TABLET BY MOUTH EVERY MORNING 90 tablet 0  . lovastatin (MEVACOR) 40 MG tablet Take 1 tablet (40 mg total) by mouth at bedtime. 90 tablet 3  . metFORMIN (GLUCOPHAGE-XR) 500 MG 24 hr tablet TAKE 1 TABLET(500 MG) BY MOUTH TWICE DAILY WITH A MEAL 180 tablet 0  . polyethylene glycol (MIRALAX / GLYCOLAX) packet Take 17 g by mouth daily as needed for mild constipation. 14 each 0  . TOUJEO SOLOSTAR 300 UNIT/ML SOPN Inject 30 Units into the skin at bedtime. 45 mL 3   No current facility-administered medications on file prior to visit.      Review of Systems Pertinent positives and negatives in the history of present illness.     Objective:   Physical Exam BP 134/68   Wt 220 lb (99.8 kg)   BMI 37.18 kg/m   Alert and oriented and in no acute distress. Respirations unlabored. Unable to examine due to this being a virtual visit.       Assessment & Plan:  Essential hypertension - Plan: BP in goal range. Continue current medication regimen.   CKD (chronic kidney disease) stage 3, GFR 30-59 ml/min (HCC) - Plan: she will come in for labs and we will continue to monitor. Discussed importance of good BP and BS control.   Inability to walk - Plan: continue following up with Dr. Letta Pate. Using wheelchair. Consider referral to [facility] for assistance with groceries and transportation to medical appt when needed.   Weakness of left leg - Plan: see above   Uncontrolled type 2 diabetes with neuropathy (HCC) - Plan: managed by Dr. Cruzita Lederer.   Medication management - Plan: will adjust medication as needed per lab results.   Fatty liver disease, nonalcoholic - Plan: Ambulatory referral to Gastroenterology, she does not appear to use NSAIDs or alcohol  regularly.   Abdominal pain, chronic, left lower quadrant - Plan: Ambulatory referral to Gastroenterology, this is chronic but worsening and relieved with bowel movements. No red flag symptoms. No sign of infection. Will refer to GI since she has never had a colonoscopy we cannot rule out underlying pathology.   She will come in for labs in the next week or two. Future labs ordered.    Time spent on call was 23 minutes and in review of previous records 4 minutes total.  This virtual service is not related to other E/M service within previous 7 days.

## 2018-08-26 DIAGNOSIS — G8220 Paraplegia, unspecified: Secondary | ICD-10-CM | POA: Diagnosis not present

## 2018-08-26 DIAGNOSIS — M4804 Spinal stenosis, thoracic region: Secondary | ICD-10-CM | POA: Diagnosis not present

## 2018-08-26 DIAGNOSIS — G992 Myelopathy in diseases classified elsewhere: Secondary | ICD-10-CM | POA: Diagnosis not present

## 2018-08-26 DIAGNOSIS — L8995 Pressure ulcer of unspecified site, unstageable: Secondary | ICD-10-CM | POA: Diagnosis not present

## 2018-09-10 NOTE — Progress Notes (Signed)
Pt prescreened for 7-17 appt 

## 2018-09-11 ENCOUNTER — Other Ambulatory Visit: Payer: Self-pay

## 2018-09-11 ENCOUNTER — Encounter: Payer: Self-pay | Admitting: Gastroenterology

## 2018-09-11 ENCOUNTER — Ambulatory Visit (INDEPENDENT_AMBULATORY_CARE_PROVIDER_SITE_OTHER): Payer: Medicare Other | Admitting: Gastroenterology

## 2018-09-11 VITALS — Ht 64.0 in | Wt 220.0 lb

## 2018-09-11 DIAGNOSIS — R1032 Left lower quadrant pain: Secondary | ICD-10-CM | POA: Diagnosis not present

## 2018-09-11 DIAGNOSIS — Z7409 Other reduced mobility: Secondary | ICD-10-CM

## 2018-09-11 DIAGNOSIS — K760 Fatty (change of) liver, not elsewhere classified: Secondary | ICD-10-CM | POA: Diagnosis not present

## 2018-09-11 DIAGNOSIS — Z1211 Encounter for screening for malignant neoplasm of colon: Secondary | ICD-10-CM

## 2018-09-11 MED ORDER — DICYCLOMINE HCL 10 MG PO CAPS: 10.0000 mg | ORAL_CAPSULE | Freq: Three times a day (TID) | ORAL | 1 refills | Status: DC | PRN

## 2018-09-11 MED ORDER — POLYETHYLENE GLYCOL 3350 17 G PO PACK: 17.0000 g | PACK | Freq: Every day | ORAL | 0 refills | Status: DC

## 2018-09-11 NOTE — Patient Instructions (Signed)
If you are age 69 or older, your body mass index should be between 23-30. Your Body mass index is 37.76 kg/m. If this is out of the aforementioned range listed, please consider follow up with your Primary Care Provider.  If you are age 60 or younger, your body mass index should be between 19-25. Your Body mass index is 37.76 kg/m. If this is out of the aformentioned range listed, please consider follow up with your Primary Care Provider.   To help prevent the possible spread of infection to our patients, communities, and staff; we will be implementing the following measures:  As of now we are not allowing any visitors/family members to accompany you to any upcoming appointments with [facility]. If you have any concerns about this please contact our office to discuss prior to the appointment.   Please go to the lab in the basement of our building to have lab work. Hit "B" for basement when you get on the elevator.  When the doors open the lab is on your left.  We will call you with the results. Thank you.  You have been scheduled for a CT scan of the abdomen and pelvis at [facility] ([address]---this is in the same building as Press [facility]).   You are scheduled on Monday, 7-27 at 3:45pm. You should arrive 15 minutes prior to your appointment time for registration. Please follow the written instructions below on the day of your exam:  WARNING: IF YOU ARE ALLERGIC TO IODINE/X-RAY DYE, PLEASE NOTIFY RADIOLOGY IMMEDIATELY AT [phone]! YOU WILL BE GIVEN A 13 HOUR PREMEDICATION PREP.  1) Do not eat anything after 11:45am (4 hours prior to your test) 2) You have been given 2 bottles of oral contrast to drink. The solution may taste better if refrigerated, but do NOT add ice or any other liquid to this solution. Shake well before drinking.    Drink 1 bottle of contrast @ 1:45pm (2 hours prior to your exam)  Drink 1 bottle of contrast @ 2:45pm (1 hour prior to  your exam)  You may take any medications as prescribed with a small amount of water, if necessary. If you take any of the following medications: METFORMIN, GLUCOPHAGE, GLUCOVANCE, AVANDAMET, RIOMET, FORTAMET, [place] MET, JANUMET, GLUMETZA or METAGLIP, you MAY be asked to HOLD this medication 48 hours AFTER the exam.  The purpose of you drinking the oral contrast is to aid in the visualization of your intestinal tract. The contrast solution may cause some diarrhea. Depending on your individual set of symptoms, you may also receive an intravenous injection of x-ray contrast/dye. Plan on being at [facility] for 30 minutes or longer, depending on the type of exam you are having performed.  This test typically takes 30-45 minutes to complete.  If you have any questions regarding your exam or if you need to reschedule, you may call the CT department at [phone] between the hours of 8:00 am and 5:00 pm, Monday-Friday.  __________________________________________________________  We have sent the following medications to your pharmacy for you to pick up at your convenience: Bentyl 65m: Take once every 8 hours as needed  Please purchase the following medications over the counter and take as directed: Miralax, take as directed once to twice daily as needed  Thank you for entrusting me with your care and for choosing [facility], Dr. SCarolina Cellar

## 2018-09-11 NOTE — Progress Notes (Signed)
Virtual Visit via Video Note  I connected with Cassandra Stewart on 09/11/18 at 11:00 AM EDT by a video enabled telemedicine application and verified that I am speaking with the correct person using two identifiers.  I discussed the limitations of evaluation and management by telemedicine and the availability of in person appointments. The patient expressed understanding and agreed to proceed.  THIS ENCOUNTER IS A VIRTUAL VISIT DUE TO COVID-19 - PATIENT WAS NOT SEEN IN THE OFFICE. PATIENT HAS CONSENTED TO VIRTUAL VISIT / TELEMEDICINE VISIT USING DOXIMITY   Location of patient: home Location of provider: office Name of referring provider: Harland Dingwall NP Persons participating: myself, patient  HPI :  68 y/o female with a history of DM, HTN, HLD, fatty liver, referred by Harland Dingwall NP for a new patient visit for fatty liver and abdominal pain.  Pain in the LLQ. She thinks ongoing for a "long time", worsening over time, she thinks perhaps bothering her for a year or so. She previously had it intermittently, but now over the past year she has constant / chronic pain. Pain never goes away, present constantly, rated 7-8/10 at worst, and rated 2/10 at best. She has mostly mild pain, severe pain occasionally. Pain can radiate at times to the rest of her lower left abdomen. She has a hard time if saying any clear triggers to it. If she over extends her self such as mopping or cleaning it may get worse. Not tender in the abdomen. Eating does not make worse. She previously had a lot of constipation which bothered her, but eating more plant based diet, having a BM 2-3 times per week which is better for her. No blood in the stools. She does have some hemorrhoids which bother her at times. Having a bowel movement does reduce her pain and she generally feels better after a bowel movement.   She has never had a prior colonoscopy. No FH of colon cancer. Sister with pancreatitic cancer.   Father had  cirrhosis of the liver from alcoholism. She does not drink alcohol much at all, perhaps a drink once or twice per year. LFTs normal, viral hepatitis testing in 2017 negative. ALT elevated in 2017 and then normal since. She has been trying to eat better and has had some mild weight loss.   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 Lives alone.   CT renal stone study 11/23/17 - no acute abnormality  US abdomen 11/06/15 - no gallstones, fatty liver   Past Medical History:  Diagnosis Date   Elevated LFTs    Fatty liver    on [place].    Hyperlipemia    Hypertension    Uncontrolled type 2 diabetes with neuropathy [facility])      Past Surgical History:  Procedure Laterality Date   APPENDECTOMY  1969   BREAST BIOPSY Right    CATARACT EXTRACTION     May 21016   LUMBAR DISC SURGERY     LUMBAR LAMINECTOMY/DECOMPRESSION MICRODISCECTOMY N/A 07/06/2015   Procedure: Thoracic ten- eleven laminectomy for spinal canal decompression;  Surgeon: Ashok Pall, MD;  Location: [place];  Service: Orthopedics;  Laterality: N/A;   Family History  Problem Relation Age of Onset   Diabetes Mother    Heart disease Mother    Kidney disease Brother        Two brothers on ESRD   Cancer Sister  Amblyopia Neg Hx    Blindness Neg Hx    Cataracts Neg Hx    Glaucoma Neg Hx    Macular degeneration Neg Hx    Retinal detachment Neg Hx    Strabismus Neg Hx    Retinitis pigmentosa Neg Hx    Colon cancer Neg Hx    Esophageal cancer Neg Hx    Social History   Tobacco Use   Smoking status: Never Smoker   Smokeless tobacco: Never Used  Substance Use Topics   Alcohol use: No   Drug use: No   Current Outpatient Medications  Medication Sig Dispense Refill   ACCU-CHEK SOFTCLIX LANCETS lancets Test 3 times daily. Pt uses accu-chek softclix lancing device 200 each 3    acetaminophen (ACETAMINOPHEN 8 HOUR) 650 MG CR tablet Take 650 mg by mouth every 8 (eight) hours as needed for pain.     albuterol (PROVENTIL HFA) 108 (90 Base) MCG/ACT inhaler Inhale into the lungs daily as needed.      amLODipine (NORVASC) 10 MG tablet TAKE 1 TABLET BY MOUTH EVERY MORNING 90 tablet 0   aspirin EC 81 MG tablet Take 81 mg by mouth every evening.     Blood Glucose Monitoring Suppl (ACCU-CHEK AVIVA PLUS) w/Device KIT Use as advised 1 kit 1   Cholecalciferol (VITAMIN D PO) Take 1 tablet by mouth daily. 1000 mg daily     Cyanocobalamin (VITAMIN B 12 PO) Take 5,000 mg by mouth daily.     Dulaglutide (TRULICITY) 1.5 RV/6.1BP SOPN Inject 1.5 mg into the skin once a week. 4 pen 11   gabapentin (NEURONTIN) 300 MG capsule Take 1 capsule (300 mg total) by mouth 3 (three) times daily. 270 capsule 0   glucose blood (ACCU-CHEK AVIVA PLUS) test strip Use as instructed 4x a day 200 each 11   hydrochlorothiazide (MICROZIDE) 12.5 MG capsule TAKE 1 CAPSULE(12.5 MG) BY MOUTH DAILY 90 capsule 0   insulin lispro (HUMALOG KWIKPEN) 100 UNIT/ML KwikPen Inject 0.05-0.07 mLs (5-7 Units total) into the skin 3 (three) times daily with meals. 15 mL 11   Insulin Pen Needle (CAREFINE PEN NEEDLES) 32G X 4 MM MISC Use 4x a day 300 each 3   lisinopril (PRINIVIL,ZESTRIL) 10 MG tablet TAKE 1 TABLET BY MOUTH EVERY MORNING 90 tablet 0   lovastatin (MEVACOR) 40 MG tablet Take 1 tablet (40 mg total) by mouth at bedtime. 90 tablet 3   metFORMIN (GLUCOPHAGE-XR) 500 MG 24 hr tablet TAKE 1 TABLET(500 MG) BY MOUTH TWICE DAILY WITH A MEAL 180 tablet 0   polyethylene glycol (MIRALAX / GLYCOLAX) packet Take 17 g by mouth daily as needed for mild constipation. 14 each 0   TOUJEO SOLOSTAR 300 UNIT/ML SOPN Inject 30 Units into the skin at bedtime. 45 mL 3   No current facility-administered medications for this visit.    Allergies  Allergen Reactions   Other     PT PREFERS TO NOT HAVE ANY NARCOTIC MEDICATIONS     Chlorhexidine Rash    Burning after using CHG wipes-used for surgery   Oxycodone Hcl     Other reaction(s): Hallucination Marked hallucinations and palpitations following dose of 10m on 04/30/2015      Review of Systems: All systems reviewed and negative except where noted in HPI.   Lab Results  Component Value Date   WBC 6.1 11/23/2017   HGB 12.2 11/23/2017   HCT 39.7 11/23/2017   MCV 93.6 11/23/2017   PLT 294 11/23/2017    Lab  Results  Component Value Date   CREATININE 1.10 (H) 11/23/2017   BUN 21 11/23/2017   NA 140 11/23/2017   K 4.8 11/23/2017   CL 107 11/23/2017   CO2 25 11/23/2017    Lab Results  Component Value Date   ALT 16 11/05/2017   AST 17 11/05/2017   ALKPHOS 77 11/05/2017   BILITOT 0.2 11/05/2017    Lab Results  Component Value Date   INR 1.03 07/06/2015     Physical Exam: Ht 5' 4"  (1.626 m) Comment: pt provided over the phone   Wt 220 lb (99.8 kg) Comment: pt provided over the phone   BMI 37.76 kg/m  Constitutional: Pleasant,well-developed, female in no acute distress. Psychiatric: Normal mood and affect. Behavior is normal.   ASSESSMENT AND PLAN: 68 y/o female here for a new patient assessment of the following issues:  LLQ pain / Immobility / Colon cancer screening - > 1 year worth of LLQ pain however has worsened over the past year and now constant, and is improved with moving her bowels. I discussed ddx with her. She has never had a prior colonoscopy and warrants one given her symptoms and for screening purposes. She has declined colon cancer screening in the past as due to her immobility this would be difficult for her to prep, she also has other concerns about the exam which we discussed today. I discussed what colonoscopy is, risks / benefits, how to prepare, etc. I think she could have it done at the [facility] as she can stand with assistance, however the prep will be hard for her and may need to do this at the hospital and admit her for  help with bowel prep. She is very anxious about going to the hospital in light of the COVID-19 outbreak and I agree that we would want to avoid that if possible. She wants to think about this for now. In the interim, will proceed with a CT Scan of the abdomen and pelvis with IV contrast to further evaluate given her last imaging was noncontrast and symptoms have progressed since that time. We will also check baseline labs to ensure stable. She understands the need for colonoscopy and is agreeable to colonoscopy at some point in time once logistics can be sorted out in light of COVID-19 outbreak. We will talk more about this once CT comes back and labs. In the interim, she should use Miralax daily and titrate up for goal 1 BM per day, she may feel better with that, and can try some bentyl PRN in the interim as well. She agreed.   Fatty liver - discussed spectrum of fatty liver disease, risks for NASH and cirrhosis. Her ALT has been normal over the past few years. Will repeat it now since it has been several months. She does not drink alcohol. Weight loss would be beneficial here but she has had a hard time with this in light of immobility. Will need to monitor this over time. She agreed.   Norwalk Cellar, MD [facility]  CC: Girtha Rm, NP-C

## 2018-09-17 ENCOUNTER — Telehealth: Payer: Self-pay

## 2018-09-17 NOTE — Telephone Encounter (Signed)
Called and spoke to pt.  She has a CT on Monday but has not had her lab work done. She indicated she will go tomorrow and I asked her to go in the am if at all possible so we can have the results in time. She is relying on [facility] to get her there. Said she will do her best to go in the morning.

## 2018-09-18 ENCOUNTER — Telehealth: Payer: Self-pay | Admitting: Family Medicine

## 2018-09-18 ENCOUNTER — Other Ambulatory Visit (INDEPENDENT_AMBULATORY_CARE_PROVIDER_SITE_OTHER): Payer: Medicare Other

## 2018-09-18 DIAGNOSIS — K760 Fatty (change of) liver, not elsewhere classified: Secondary | ICD-10-CM

## 2018-09-18 DIAGNOSIS — Z7409 Other reduced mobility: Secondary | ICD-10-CM | POA: Diagnosis not present

## 2018-09-18 DIAGNOSIS — Z1211 Encounter for screening for malignant neoplasm of colon: Secondary | ICD-10-CM

## 2018-09-18 DIAGNOSIS — R1032 Left lower quadrant pain: Secondary | ICD-10-CM | POA: Diagnosis not present

## 2018-09-18 LAB — COMPREHENSIVE METABOLIC PANEL
ALT: 15 U/L (ref 0–35)
AST: 15 U/L (ref 0–37)
Albumin: 4.1 g/dL (ref 3.5–5.2)
Alkaline Phosphatase: 73 U/L (ref 39–117)
BUN: 18 mg/dL (ref 6–23)
CO2: 29 mEq/L (ref 19–32)
Calcium: 9.7 mg/dL (ref 8.4–10.5)
Chloride: 104 mEq/L (ref 96–112)
Creatinine, Ser: 1.07 mg/dL (ref 0.40–1.20)
GFR: 61.35 mL/min (ref >60.00)
Glucose, Bld: 133 mg/dL — ABNORMAL HIGH (ref 70–99)
Potassium: 4.6 mEq/L (ref 3.5–5.1)
Sodium: 140 mEq/L (ref 135–145)
Total Bilirubin: 0.3 mg/dL (ref 0.2–1.2)
Total Protein: 7.1 g/dL (ref 6.0–8.3)

## 2018-09-18 LAB — CBC WITH DIFFERENTIAL/PLATELET
Basophils Absolute: 0.1 10*3/uL (ref 0.0–0.1)
Basophils Relative: 1.3 % (ref 0.0–3.0)
Eosinophils Absolute: 0.3 10*3/uL (ref 0.0–0.7)
Eosinophils Relative: 3.8 % (ref 0.0–5.0)
HCT: 37.8 % (ref 36.0–46.0)
Hemoglobin: 12.2 g/dL (ref 12.0–15.0)
Lymphocytes Relative: 33.3 % (ref 12.0–46.0)
Lymphs Abs: 2.3 10*3/uL (ref 0.7–4.0)
MCHC: 32.3 g/dL (ref 30.0–36.0)
MCV: 89.1 fl (ref 78.0–100.0)
Monocytes Absolute: 0.6 10*3/uL (ref 0.1–1.0)
Monocytes Relative: 8.5 % (ref 3.0–12.0)
Neutro Abs: 3.7 10*3/uL (ref 1.4–7.7)
Neutrophils Relative %: 53.1 % (ref 43.0–77.0)
Platelets: 292 10*3/uL (ref 150.0–400.0)
RBC: 4.24 Mil/uL (ref 3.87–5.11)
RDW: 13.4 % (ref 11.5–15.5)
WBC: 6.9 10*3/uL (ref 4.0–10.5)

## 2018-09-18 NOTE — Telephone Encounter (Signed)

## 2018-09-20 ENCOUNTER — Other Ambulatory Visit: Payer: Self-pay | Admitting: Family Medicine

## 2018-09-20 ENCOUNTER — Other Ambulatory Visit: Payer: Self-pay | Admitting: Internal Medicine

## 2018-09-21 ENCOUNTER — Other Ambulatory Visit: Payer: Self-pay

## 2018-09-21 ENCOUNTER — Ambulatory Visit (INDEPENDENT_AMBULATORY_CARE_PROVIDER_SITE_OTHER)
Admission: RE | Admit: 2018-09-21 | Discharge: 2018-09-21 | Disposition: A | Discharge status: Home / Self Care | Payer: Medicare Other | Source: Ambulatory Visit | Attending: Gastroenterology | Admitting: Gastroenterology

## 2018-09-21 DIAGNOSIS — K4020 Bilateral inguinal hernia, without obstruction or gangrene, not specified as recurrent: Secondary | ICD-10-CM | POA: Diagnosis not present

## 2018-09-21 DIAGNOSIS — R197 Diarrhea, unspecified: Secondary | ICD-10-CM | POA: Diagnosis not present

## 2018-09-21 DIAGNOSIS — R1032 Left lower quadrant pain: Secondary | ICD-10-CM | POA: Diagnosis not present

## 2018-09-21 MED ORDER — IOHEXOL 300 MG/ML  SOLN
100.0000 mL | Freq: Once | INTRAMUSCULAR | Status: AC | PRN
  Administered 2018-09-21: 100 mL via INTRAVENOUS

## 2018-09-24 ENCOUNTER — Telehealth: Payer: Self-pay | Admitting: Internal Medicine

## 2018-09-24 ENCOUNTER — Other Ambulatory Visit: Payer: Self-pay

## 2018-09-24 MED ORDER — TOUJEO SOLOSTAR 300 UNIT/ML ~~LOC~~ SOPN: 30.0000 [IU] | PEN_INJECTOR | Freq: Every day | SUBCUTANEOUS | 0 refills | Status: DC

## 2018-09-24 NOTE — Telephone Encounter (Signed)
Called pt and spoke with her about the issue and to satisfy patient, a 75 rx was sent to the pharmacy.

## 2018-09-24 NOTE — Telephone Encounter (Signed)
Patient called and stated that her medication was not re-filled by her doctor (Dr Cruzita Lederer), but by someone (Dr Loanne Drilling). It was only filled for 30 days not for 90 days as per normal.  Was very irate because #1 she was not advised that Dr Cruzita Lederer was out of the office and that someone who was not her doctor was filling her prescription, #2 her prescriptions are always 90 days and not 30 days, because she has to pay someone to pick them up for her and now she is going to have to pay someone multiple times, and #3 her pharmacy did not tell her that they were getting a new prescription for medication that did not have one.  She needs her insulin sent back to [facility] as a 90 day prescription instead of as a 30 day.  Call back number if needed is [phone]

## 2018-09-26 DIAGNOSIS — M4804 Spinal stenosis, thoracic region: Secondary | ICD-10-CM | POA: Diagnosis not present

## 2018-09-26 DIAGNOSIS — G992 Myelopathy in diseases classified elsewhere: Secondary | ICD-10-CM | POA: Diagnosis not present

## 2018-09-26 DIAGNOSIS — G8220 Paraplegia, unspecified: Secondary | ICD-10-CM | POA: Diagnosis not present

## 2018-09-26 DIAGNOSIS — L8995 Pressure ulcer of unspecified site, unstageable: Secondary | ICD-10-CM | POA: Diagnosis not present

## 2018-10-15 ENCOUNTER — Telehealth: Payer: Self-pay | Admitting: Family Medicine

## 2018-10-15 NOTE — Telephone Encounter (Signed)
done

## 2018-10-15 NOTE — Telephone Encounter (Signed)
Pt called & states her Handicap placard expired that you completed for her and she needs another completed and mailed to her so she doesn't have to come to office due to her being in wheel chair.  I put a blank one in your folder

## 2018-10-15 NOTE — Telephone Encounter (Signed)
Filled out form and mailed form to pt

## 2018-10-21 ENCOUNTER — Encounter: Payer: Medicare Other | Attending: Registered Nurse | Admitting: Registered Nurse

## 2018-10-21 ENCOUNTER — Encounter: Payer: Self-pay | Admitting: Registered Nurse

## 2018-10-21 ENCOUNTER — Other Ambulatory Visit: Payer: Self-pay

## 2018-10-21 VITALS — BP 130/78 | HR 67 | Temp 98.3°F | Resp 14

## 2018-10-21 DIAGNOSIS — G8220 Paraplegia, unspecified: Secondary | ICD-10-CM | POA: Insufficient documentation

## 2018-10-21 DIAGNOSIS — M4714 Other spondylosis with myelopathy, thoracic region: Secondary | ICD-10-CM | POA: Insufficient documentation

## 2018-10-21 NOTE — Progress Notes (Signed)
Subjective:    Patient ID: Cassandra Stewart, female    DOB: 07-22-1950, 68 y.o.   MRN: [id number]  HPI: Cassandra Stewart is a 68 y.o. female who is here for a  face to face manual wheelchair evaluation, a 18x 18 light weight manual wheel chair with pressure relieving gel cushion. The manual wheelchair will help with her mobility in her home and improved her independence with activities of daily living such as bathing and dressing.  She states her  pain is located in her abdomen left lower quadrant she states gastroenterologist following. She rates her pain 3. Her current exercise regime is walking short distances in her home.   Pain Inventory Average Pain 4 Pain Right Now 3 My pain is dull  In the last 24 hours, has pain interfered with the following? General activity 4 Relation with others 4 Enjoyment of life 4 What TIME of day is your pain at its worst? all Sleep (in general) Fair  Pain is worse with: unsure Pain improves with: rest Relief from Meds: na  Mobility use a walker how many minutes can you walk? 0 ability to climb steps?  no do you drive?  no use a wheelchair needs help with transfers  Function retired I need assistance with the following:  household duties  Neuro/Psych trouble walking  Prior Studies Any changes since last visit?  no  Physicians involved in your care Any changes since last visit?  no   Family History  Problem Relation Age of Onset  . Diabetes Mother   . Heart disease Mother   . Kidney disease Brother        Two brothers on ESRD  . Cancer Sister   . Amblyopia Neg Hx   . Blindness Neg Hx   . Cataracts Neg Hx   . Glaucoma Neg Hx   . Macular degeneration Neg Hx   . Retinal detachment Neg Hx   . Strabismus Neg Hx   . Retinitis pigmentosa Neg Hx   . Colon cancer Neg Hx   . Esophageal cancer Neg Hx    Social History   Socioeconomic History  . Marital status: Divorced    Spouse name: Not on file  . Number of children: Not  on file  . Years of education: Not on file  . Highest education level: Not on file  Occupational History  . Not on file  Social Needs  . Financial resource strain: Not on file  . Food insecurity    Worry: Not on file    Inability: Not on file  . Transportation needs    Medical: Not on file    Non-medical: Not on file  Tobacco Use  . Smoking status: Never Smoker  . Smokeless tobacco: Never Used  Substance and Sexual Activity  . Alcohol use: No  . Drug use: No  . Sexual activity: Not Currently  Lifestyle  . Physical activity    Days per week: Not on file    Minutes per session: Not on file  . Stress: Not on file  Relationships  . Social Herbalist on phone: Not on file    Gets together: Not on file    Attends religious service: Not on file    Active member of club or organization: Not on file    Attends meetings of clubs or organizations: Not on file    Relationship status: Not on file  Other Topics Concern  . Not on file  Social History  Narrative  . Not on file   Past Surgical History:  Procedure Laterality Date  . APPENDECTOMY  1969  . BREAST BIOPSY Right   . CATARACT EXTRACTION     May [id number]  . LUMBAR DISC SURGERY    . LUMBAR LAMINECTOMY/DECOMPRESSION MICRODISCECTOMY N/A 07/06/2015   Procedure: Thoracic ten- eleven laminectomy for spinal canal decompression;  Surgeon: Coletta MemosKyle Cabbell, MD;  Location: [facility];  Service: Orthopedics;  Laterality: N/A;   Past Medical History:  Diagnosis Date  . Elevated LFTs   . Fatty liver    on [place].   Marland Kitchen. Hyperlipemia   . Hypertension   . Uncontrolled type 2 diabetes with neuropathy (HCC)    There were no vitals taken for this visit.  Opioid Risk Score:   Fall Risk Score:  `1  Depression screen PHQ 2/9  [facility] 08/25/2018 11/05/2017 10/21/2017 09/09/2017 08/05/2016 08/01/2016 07/29/2016  Decreased Interest 0 0 0 0 0 0 0  Down, Depressed, Hopeless 0 3 0 0 0 0 0  PHQ - 2 Score 0 3 0 0 0 0 0      Review of Systems  Constitutional: Negative.   HENT: Negative.   Eyes: Negative.   Respiratory: Negative.   Cardiovascular: Positive for leg swelling.  Gastrointestinal: Positive for abdominal pain and constipation.  Endocrine:       High blood sugar  Genitourinary: Negative.   Musculoskeletal: Positive for myalgias.  Skin: Negative.   Allergic/Immunologic: Negative.   Neurological: Negative.   Psychiatric/Behavioral: Negative.   All other systems reviewed and are negative.      Objective:   Physical Exam Vitals signs and nursing note reviewed.  Constitutional:      Appearance: Normal appearance.  Neck:     Musculoskeletal: Normal range of motion and neck supple.  Cardiovascular:     Rate and Rhythm: Normal rate and regular rhythm.     Pulses: Normal pulses.     Heart sounds: Normal heart sounds.  Pulmonary:     Effort: Pulmonary effort is normal.     Breath sounds: Normal breath sounds.  Musculoskeletal:     Comments: Normal Muscle Bulk and Muscle Testing Reveals:  Upper Extremities: Full ROM and Muscle Strength 5/5 Lower Extremities: Decreased ROM and Muscle Strength 4/5 Arrived in motorized wheelchair   Skin:    General: Skin is warm and dry.  Neurological:     Mental Status: She is alert and oriented to person, place, and time.  Psychiatric:        Mood and Affect: Mood normal.        Behavior: Behavior normal.           Assessment & Plan:  1. 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  RX: Media plannerManual Wheelchair, she is able to independently propel a wheelchair, her upper extremity function is normal.   20  minutes of face to face patient care time was spent during this visit. All questions were encouraged and answered.  F/U in 2 months

## 2018-10-27 DIAGNOSIS — L8995 Pressure ulcer of unspecified site, unstageable: Secondary | ICD-10-CM | POA: Diagnosis not present

## 2018-10-27 DIAGNOSIS — G992 Myelopathy in diseases classified elsewhere: Secondary | ICD-10-CM | POA: Diagnosis not present

## 2018-10-27 DIAGNOSIS — M4804 Spinal stenosis, thoracic region: Secondary | ICD-10-CM | POA: Diagnosis not present

## 2018-10-27 DIAGNOSIS — G8220 Paraplegia, unspecified: Secondary | ICD-10-CM | POA: Diagnosis not present

## 2018-10-28 ENCOUNTER — Encounter: Payer: Self-pay | Admitting: Family Medicine

## 2018-11-12 ENCOUNTER — Encounter: Payer: Self-pay | Admitting: Family Medicine

## 2018-11-13 ENCOUNTER — Other Ambulatory Visit: Payer: Self-pay

## 2018-11-13 MED ORDER — TOUJEO SOLOSTAR 300 UNIT/ML ~~LOC~~ SOPN: 30.0000 [IU] | PEN_INJECTOR | Freq: Every day | SUBCUTANEOUS | 4 refills | Status: DC

## 2018-11-17 ENCOUNTER — Ambulatory Visit: Payer: Medicare Other | Admitting: Internal Medicine

## 2018-11-26 DIAGNOSIS — G992 Myelopathy in diseases classified elsewhere: Secondary | ICD-10-CM | POA: Diagnosis not present

## 2018-11-26 DIAGNOSIS — G8220 Paraplegia, unspecified: Secondary | ICD-10-CM | POA: Diagnosis not present

## 2018-11-26 DIAGNOSIS — L8995 Pressure ulcer of unspecified site, unstageable: Secondary | ICD-10-CM | POA: Diagnosis not present

## 2018-11-26 DIAGNOSIS — M4804 Spinal stenosis, thoracic region: Secondary | ICD-10-CM | POA: Diagnosis not present

## 2018-11-30 ENCOUNTER — Encounter (HOSPITAL_COMMUNITY): Payer: Self-pay

## 2018-11-30 ENCOUNTER — Emergency Department (HOSPITAL_COMMUNITY)
Admission: EM | Admit: 2018-11-30 | Discharge: 2018-12-01 | Disposition: A | Discharge status: Home / Self Care | Payer: Medicare Other | Attending: Emergency Medicine | Admitting: Emergency Medicine

## 2018-11-30 ENCOUNTER — Other Ambulatory Visit: Payer: Self-pay

## 2018-11-30 ENCOUNTER — Emergency Department (HOSPITAL_COMMUNITY): Payer: Medicare Other

## 2018-11-30 DIAGNOSIS — Z794 Long term (current) use of insulin: Secondary | ICD-10-CM | POA: Insufficient documentation

## 2018-11-30 DIAGNOSIS — R1032 Left lower quadrant pain: Secondary | ICD-10-CM | POA: Diagnosis not present

## 2018-11-30 DIAGNOSIS — Z7982 Long term (current) use of aspirin: Secondary | ICD-10-CM | POA: Diagnosis not present

## 2018-11-30 DIAGNOSIS — I10 Essential (primary) hypertension: Secondary | ICD-10-CM | POA: Diagnosis not present

## 2018-11-30 DIAGNOSIS — I129 Hypertensive chronic kidney disease with stage 1 through stage 4 chronic kidney disease, or unspecified chronic kidney disease: Secondary | ICD-10-CM | POA: Diagnosis not present

## 2018-11-30 DIAGNOSIS — E1122 Type 2 diabetes mellitus with diabetic chronic kidney disease: Secondary | ICD-10-CM | POA: Diagnosis not present

## 2018-11-30 DIAGNOSIS — R079 Chest pain, unspecified: Secondary | ICD-10-CM | POA: Diagnosis not present

## 2018-11-30 DIAGNOSIS — N183 Chronic kidney disease, stage 3 unspecified: Secondary | ICD-10-CM | POA: Insufficient documentation

## 2018-11-30 DIAGNOSIS — R0602 Shortness of breath: Secondary | ICD-10-CM | POA: Insufficient documentation

## 2018-11-30 DIAGNOSIS — Z79899 Other long term (current) drug therapy: Secondary | ICD-10-CM | POA: Insufficient documentation

## 2018-11-30 DIAGNOSIS — R202 Paresthesia of skin: Secondary | ICD-10-CM | POA: Diagnosis not present

## 2018-11-30 LAB — CBC
HCT: 40.8 % (ref 36.0–46.0)
Hemoglobin: 13.2 g/dL (ref 12.0–15.0)
MCH: 30.1 pg (ref 26.0–34.0)
MCHC: 32.4 g/dL (ref 30.0–36.0)
MCV: 92.9 fL (ref 80.0–100.0)
Platelets: 284 10*3/uL (ref 150–400)
RBC: 4.39 MIL/uL (ref 3.87–5.11)
RDW: 12.6 % (ref 11.5–15.5)
WBC: 7.2 10*3/uL (ref 4.0–10.5)
nRBC: 0 % (ref 0.0–0.2)

## 2018-11-30 LAB — BASIC METABOLIC PANEL
Anion gap: 10 (ref 5–15)
BUN: 15 mg/dL (ref 8–23)
CO2: 26 mmol/L (ref 22–32)
Calcium: 9.3 mg/dL (ref 8.9–10.3)
Chloride: 103 mmol/L (ref 98–111)
Creatinine, Ser: 1.11 mg/dL — ABNORMAL HIGH (ref 0.44–1.00)
GFR calc Af Amer: 58 mL/min — ABNORMAL LOW (ref >60)
GFR calc non Af Amer: 50 mL/min — ABNORMAL LOW (ref >60)
Glucose, Bld: 207 mg/dL — ABNORMAL HIGH (ref 70–99)
Potassium: 4.5 mmol/L (ref 3.5–5.1)
Sodium: 139 mmol/L (ref 135–145)

## 2018-11-30 LAB — TROPONIN I (HIGH SENSITIVITY): Troponin I (High Sensitivity): 7 ng/L (ref <18)

## 2018-11-30 MED ORDER — SODIUM CHLORIDE 0.9% FLUSH
3.0000 mL | Freq: Once | INTRAVENOUS | Status: AC
  Administered 2018-11-30: 3 mL via INTRAVENOUS

## 2018-11-30 NOTE — Discharge Instructions (Addendum)
You have multiple risk factors for heart disease.  Follow up with your doctor, they may want to refer you to cardiology or have you perform a stress test.   Return for worsening symptoms.

## 2018-11-30 NOTE — ED Triage Notes (Signed)
Pt from home bib [facility] due to sudden onset of SOB this evening accompanied with throbbing 6/10 chest pain in the subclavicular are. 324 aspirin and 1 nitro given en route to patient.

## 2018-11-30 NOTE — ED Provider Notes (Signed)
[facility] EMERGENCY DEPARTMENT Provider Note   CSN: [id number] Arrival date & time: 11/30/18  2055     History   Chief Complaint Chief Complaint  Patient presents with  . Shortness of Breath    HPI Cassandra Stewart is a 68 y.o. female.     68 yo F with a chief complaint of shortness of breath. Happened spontaneously while she was at rest.  Nothing seemed to make it better or worse.  Lasted about an hour or 2.  As the shortness of breath started to wear off she started to have some right supraclavicular pain.  She also felt some tingling in her right hand.  This is all resolved.  She has no history of MI.  Has a history of hypertension hyperlipidemia and diabetes and family history of MI with her mom.  She denies smoking history.  She denies history of PE or DVT.  She has chronic left-sided lower extremity edema compared to the right for at least the past 5 years and is unchanged.  She denies hemoptysis denies estrogen use denies recent surgery immobilization or hospitalization.  She denies trauma to the chest.  Denies cough congestion or fever.  Has chronic left lower quadrant abdominal pain is unchanged.  The history is provided by the patient.  Shortness of Breath Severity:  Severe Onset quality:  Sudden Duration:  2 hours Timing:  Constant Progression:  Resolved Chronicity:  New Relieved by:  Nothing Worsened by:  Nothing Ineffective treatments:  None tried Associated symptoms: abdominal pain ( chronic and unchanged from baseline) and chest pain (right sided, above the clavicle)   Associated symptoms: no fever, no headaches, no vomiting and no wheezing     Past Medical History:  Diagnosis Date  . Elevated LFTs   . Fatty liver    on [place].   Marland Kitchen Hyperlipemia   . Hypertension   . Uncontrolled type 2 diabetes with neuropathy [facility])     Patient Active Problem List   Diagnosis Date Noted  . Abdominal pain, chronic, left lower quadrant 08/25/2018  .  Personal history of noncompliance with medical treatment, presenting hazards to health 01/29/2017  . Low serum vitamin B12 11/15/2016  . CKD (chronic kidney disease) stage 3, GFR 30-59 ml/min 10/24/2016  . Fatty liver disease, nonalcoholic 40/98/1191  . Hyperlipemia   . Estrogen deficiency 10/25/2015  . Thoracic spinal stenosis 07/06/2015  . UTI (lower urinary tract infection) 07/04/2015  . Uncontrolled type 2 diabetes with neuropathy ([place]) 07/04/2015  . Essential hypertension 07/04/2015  . Morbid obesity ([place]) 07/04/2015  . Inability to walk 07/04/2015  . Weakness of left leg 07/04/2015  . Elevated LFTs 07/04/2015    Past Surgical History:  Procedure Laterality Date  . APPENDECTOMY  1969  . BREAST BIOPSY Right   . CATARACT EXTRACTION     May 21016  . LUMBAR DISC SURGERY    . LUMBAR LAMINECTOMY/DECOMPRESSION MICRODISCECTOMY N/A 07/06/2015   Procedure: Thoracic ten- eleven laminectomy for spinal canal decompression;  Surgeon: Ashok Pall, MD;  Location: [place] [facility];  Service: Orthopedics;  Laterality: N/A;     OB History   No obstetric history on file.      Home Medications    Prior to Admission medications   Medication Sig Start Date End Date Taking? Authorizing Provider  ACCU-CHEK SOFTCLIX LANCETS lancets Test 3 times daily. Pt uses accu-chek softclix lancing device 07/25/16   Henson, Vickie L, NP-C  acetaminophen (ACETAMINOPHEN 8 HOUR) 650 MG CR tablet  Take 650 mg by mouth every 8 (eight) hours as needed for pain.    [provider]  albuterol (PROVENTIL HFA) 108 (90 Base) MCG/ACT inhaler Inhale into the lungs daily as needed.  05/13/14   [provider]  amLODipine (NORVASC) 10 MG tablet TAKE 1 TABLET BY MOUTH EVERY MORNING 09/21/18   Henson, Vickie L, NP-C  aspirin EC 81 MG tablet Take 81 mg by mouth every evening.    [provider]  Blood Glucose Monitoring Suppl (ACCU-CHEK AVIVA PLUS) w/Device KIT Use as advised 07/11/17   Gherghe, Cristina, MD   Cholecalciferol (VITAMIN D PO) Take 1 tablet by mouth daily. 1000 mg daily    [provider]  Cyanocobalamin (VITAMIN B 12 PO) Take 5,000 mg by mouth daily.    [provider]  dicyclomine (BENTYL) 10 MG capsule Take 1 capsule (10 mg total) by mouth every 8 (eight) hours as needed for spasms. 09/11/18   Armbruster, Steven P, MD  Dulaglutide (TRULICITY) 1.5 MG/0.5ML SOPN Inject 1.5 mg into the skin once a week. 03/16/18   Gherghe, Cristina, MD  gabapentin (NEURONTIN) 300 MG capsule Take 1 capsule (300 mg total) by mouth 3 (three) times daily. 06/18/18   Kirsteins, Andrew E, MD  glucose blood (ACCU-CHEK AVIVA PLUS) test strip Use as instructed 4x a day 07/11/17   Gherghe, Cristina, MD  hydrochlorothiazide (MICROZIDE) 12.5 MG capsule TAKE 1 CAPSULE(12.5 MG) BY MOUTH DAILY 09/21/18   Henson, Vickie L, NP-C  insulin lispro (HUMALOG KWIKPEN) 100 UNIT/ML KwikPen Inject 0.05-0.07 mLs (5-7 Units total) into the skin 3 (three) times daily with meals. 03/16/18   Gherghe, Cristina, MD  Insulin Pen Needle (CAREFINE PEN NEEDLES) 32G X 4 MM MISC Use 4x a day 11/15/16   Gherghe, Cristina, MD  lisinopril (ZESTRIL) 10 MG tablet TAKE 1 TABLET BY MOUTH EVERY MORNING 09/21/18   Henson, Vickie L, NP-C  lovastatin (MEVACOR) 40 MG tablet Take 1 tablet (40 mg total) by mouth at bedtime. 03/16/18   Gherghe, Cristina, MD  metFORMIN (GLUCOPHAGE-XR) 500 MG 24 hr tablet TAKE 1 TABLET(500 MG) BY MOUTH TWICE DAILY WITH A MEAL 09/21/18   Gherghe, Cristina, MD  polyethylene glycol (MIRALAX / GLYCOLAX) 17 g packet Take 17 g by mouth daily. Take once to twice daily 09/11/18   Armbruster, Steven P, MD  TOUJEO SOLOSTAR 300 UNIT/ML SOPN Inject 30 Units into the skin at bedtime. 11/13/18   Gherghe, Cristina, MD    Family History Family History  Problem Relation Age of Onset  . Diabetes Mother   . Heart disease Mother   . Kidney disease Brother        Two brothers on ESRD  . Cancer Sister   . Amblyopia Neg Hx   . Blindness  Neg Hx   . Cataracts Neg Hx   . Glaucoma Neg Hx   . Macular degeneration Neg Hx   . Retinal detachment Neg Hx   . Strabismus Neg Hx   . Retinitis pigmentosa Neg Hx   . Colon cancer Neg Hx   . Esophageal cancer Neg Hx     Social History Social History   Tobacco Use  . Smoking status: Never Smoker  . Smokeless tobacco: Never Used  Substance Use Topics  . Alcohol use: No  . Drug use: No     Allergies   Other, Chlorhexidine, and Oxycodone hcl   Review of Systems Review of Systems  Constitutional: Negative for chills and fever.  HENT: Negative for congestion and   rhinorrhea.   Eyes: Negative for redness and visual disturbance.  Respiratory: Positive for shortness of breath. Negative for wheezing.   Cardiovascular: Positive for chest pain (right sided, above the clavicle) and leg swelling (left leg, unchanged in 5 years). Negative for palpitations.  Gastrointestinal: Positive for abdominal pain ( chronic and unchanged from baseline). Negative for nausea and vomiting.  Genitourinary: Negative for dysuria and urgency.  Musculoskeletal: Negative for arthralgias and myalgias.  Skin: Negative for pallor and wound.  Neurological: Negative for dizziness and headaches.     Physical Exam Updated Vital Signs BP (!) 146/62   Pulse 62   Temp 98.6 F (37 C) (Oral)   Resp (!) 21   SpO2 100%   Physical Exam Vitals signs and nursing note reviewed.  Constitutional:      General: She is not in acute distress.    Appearance: She is well-developed. She is not diaphoretic.  HENT:     Head: Normocephalic and atraumatic.  Eyes:     Pupils: Pupils are equal, round, and reactive to light.  Neck:     Musculoskeletal: Normal range of motion and neck supple.  Cardiovascular:     Rate and Rhythm: Normal rate and regular rhythm.     Heart sounds: No murmur. No friction rub. No gallop.   Pulmonary:     Effort: Pulmonary effort is normal.     Breath sounds: No wheezing or rales.   Abdominal:     General: There is no distension.     Palpations: Abdomen is soft.     Tenderness: There is no abdominal tenderness.  Musculoskeletal:        General: No tenderness.  Skin:    General: Skin is warm and dry.  Neurological:     Mental Status: She is alert and oriented to person, place, and time.  Psychiatric:        Behavior: Behavior normal.      ED Treatments / Results  Labs (all labs ordered are listed, but only abnormal results are displayed) Labs Reviewed  BASIC METABOLIC PANEL - Abnormal; Notable for the following components:      Result Value   Glucose, Bld 207 (*)    Creatinine, Ser 1.11 (*)    GFR calc non Af Amer 50 (*)    GFR calc Af Amer 58 (*)    All other components within normal limits  CBC  TROPONIN I (HIGH SENSITIVITY)  TROPONIN I (HIGH SENSITIVITY)    EKG EKG Interpretation  Date/Time:  Monday November 30 2018 21:00:01 EDT Ventricular Rate:  81 PR Interval:    QRS Duration: 84 QT Interval:  417 QTC Calculation: 444 R Axis:   19 Text Interpretation:  Sinus rhythm Paired ventricular premature complexes Short PR interval Low voltage, precordial leads Borderline T wave abnormalities No significant change since last tracing Confirmed by Deno Etienne [phone]) on 11/30/2018 9:40:14 PM   Radiology Dg Chest Portable 1 View  Result Date: 11/30/2018 CLINICAL DATA:  Shortness of breath EXAM: PORTABLE CHEST 1 VIEW COMPARISON:  Jul 06, 2015 FINDINGS: The heart size and mediastinal contours are within normal limits. Both lungs are clear. The visualized skeletal structures are unremarkable. IMPRESSION: No acute cardiopulmonary process. Electronically Signed   By: Prudencio Pair M.D.   On: 11/30/2018 21:47    Procedures Procedures (including critical care time)  Medications Ordered in ED Medications  sodium chloride flush (NS) 0.9 % injection 3 mL (3 mLs Intravenous Given 11/30/18 2127)  Initial Impression / Assessment and Plan / ED Course  I have  reviewed the triage vital signs and the nursing notes.  Pertinent labs & imaging results that were available during my care of the patient were reviewed by me and considered in my medical decision making (see chart for details).        68 yo F with a chief complaints of shortness of breath.  Lasted for couple hours.  Atypical in nature from ACS.  She had some pain and discomfort after it ended.  Currently is asymptomatic.  Initial troponin is negative.  EKG is unchanged.  She is not anemic no leukocytosis chest x-ray viewed by me without focal infiltrate or pneumothorax.  Feel she is low risk for PE.  Will obtain a second troponin.  Signed out to Dr. Horton, please see their note for further details of care.   The patients results and plan were reviewed and discussed.   Any x-rays performed were independently reviewed by myself.   Differential diagnosis were considered with the presenting HPI.  Medications  sodium chloride flush (NS) 0.9 % injection 3 mL (3 mLs Intravenous Given 11/30/18 2127)    Vitals:   11/30/18 2215 11/30/18 2230 11/30/18 2245 11/30/18 2300  BP: (!) 158/67 (!) 160/69 (!) 144/64 (!) 146/62  Pulse: 65 61 63 62  Resp: 20 20 19 (!) 21  Temp:      TempSrc:      SpO2: 100% 99% 100% 100%    Final diagnoses:  SOB (shortness of breath)      Final Clinical Impressions(s) / ED Diagnoses   Final diagnoses:  SOB (shortness of breath)    ED Discharge Orders    None       Floyd, Dan, DO 11/30/18 2344  

## 2018-12-01 LAB — CBG MONITORING, ED: Glucose-Capillary: 192 mg/dL — ABNORMAL HIGH (ref 70–99)

## 2018-12-01 LAB — TROPONIN I (HIGH SENSITIVITY): Troponin I (High Sensitivity): 9 ng/L (ref <18)

## 2018-12-01 NOTE — ED Notes (Signed)
Patient verbalized understanding of discharge and follow up care. Time given for teach back and all questions answered.

## 2018-12-01 NOTE — ED Provider Notes (Signed)
Patient signed out pending repeat troponin.  Repeat troponin is 9.  On recheck, patient is comfortable and is without complaint.  She does have risk factors for heart disease.  I reviewed the patient's work-up with the patient.  Recommend close outpatient follow-up with primary physician and cardiology for stress testing.  Patient stated understanding.   Physical Exam  BP 129/79 (BP Location: Right Arm)   Pulse 70   Temp 98.6 F (37 C) (Oral)   Resp (!) 21   SpO2 100%   Problem List Items Addressed This Visit    None    Visit Diagnoses    SOB (shortness of breath)    -  Primary          Horton, Barbette Hair, MD 12/01/18 [id number]

## 2018-12-03 ENCOUNTER — Ambulatory Visit (INDEPENDENT_AMBULATORY_CARE_PROVIDER_SITE_OTHER): Payer: Medicare Other | Admitting: Family Medicine

## 2018-12-03 ENCOUNTER — Encounter: Payer: Self-pay | Admitting: Family Medicine

## 2018-12-03 ENCOUNTER — Other Ambulatory Visit: Payer: Self-pay

## 2018-12-03 VITALS — Wt 210.0 lb

## 2018-12-03 DIAGNOSIS — E1165 Type 2 diabetes mellitus with hyperglycemia: Secondary | ICD-10-CM

## 2018-12-03 DIAGNOSIS — Z9189 Other specified personal risk factors, not elsewhere classified: Secondary | ICD-10-CM

## 2018-12-03 DIAGNOSIS — E1140 Type 2 diabetes mellitus with diabetic neuropathy, unspecified: Secondary | ICD-10-CM | POA: Diagnosis not present

## 2018-12-03 DIAGNOSIS — R29898 Other symptoms and signs involving the musculoskeletal system: Secondary | ICD-10-CM | POA: Diagnosis not present

## 2018-12-03 DIAGNOSIS — Z8249 Family history of ischemic heart disease and other diseases of the circulatory system: Secondary | ICD-10-CM

## 2018-12-03 DIAGNOSIS — R262 Difficulty in walking, not elsewhere classified: Secondary | ICD-10-CM | POA: Diagnosis not present

## 2018-12-03 DIAGNOSIS — E782 Mixed hyperlipidemia: Secondary | ICD-10-CM

## 2018-12-03 DIAGNOSIS — I10 Essential (primary) hypertension: Secondary | ICD-10-CM

## 2018-12-03 DIAGNOSIS — IMO0002 Reserved for concepts with insufficient information to code with codable children: Secondary | ICD-10-CM

## 2018-12-03 MED ORDER — LOVASTATIN 40 MG PO TABS: 40.0000 mg | ORAL_TABLET | Freq: Every day | ORAL | 1 refills | Status: DC

## 2018-12-03 MED ORDER — LISINOPRIL 10 MG PO TABS: 10.0000 mg | ORAL_TABLET | Freq: Every morning | ORAL | 0 refills | Status: DC

## 2018-12-03 MED ORDER — AMLODIPINE BESYLATE 10 MG PO TABS: 10.0000 mg | ORAL_TABLET | Freq: Every morning | ORAL | 0 refills | Status: DC

## 2018-12-03 MED ORDER — HYDROCHLOROTHIAZIDE 12.5 MG PO CAPS: ORAL_CAPSULE | ORAL | 1 refills | Status: DC

## 2018-12-03 NOTE — Progress Notes (Signed)
Subjective:  Documentation for virtual audio and video telecommunications through [place] encounter:  The patient was located at home. 2 patient identifiers used.  The provider was located in the office. The patient did consent to this visit and is aware of possible charges through their insurance for this visit.  The other persons participating in this telemedicine service were none.    Patient ID: Cassandra Stewart, female    DOB: 12-22-1950, 68 y.o.   MRN: [id number]  HPI Chief Complaint  Patient presents with  . ER follow-up    ER follow-up, FMLA need for her daughter to be able to take off work to help her out.  needs refills   This is a virtual follow up visit regarding her recent ED visit for shortness of breath. Negative ACS work up. It was recommended that she follow up with a cardiologist.  States heart disease runs in her family including her daughter. States it has been years since she has seen a cardiologist and this was in [place].  Denies having any shortness of breath since being in the ED. Feels at baseline except for a "nagging headache".   States she has a headache in the left side of her head. Pain started Monday, is improving.  No ear pain. Denies fever, chills, dizziness, vision changes, numbness, tingling or new weakness. She does have chronic left leg weakness.   HTN- BP has been controlled as far as she knows. She does not have a BP machine at her home. It was normal in the ED. States she needs refills on her medications.   HL- reports taking her statin daily without any concerns.   States she has an upcoming visit on 12/28/2018 with Dr. Cruzita Lederer for her diabetes.   She questions whether I would be willing to write her daughter FMLA papers so that she could help her with cleaning. Her ex husband lives with her.  States her Daughter works for [employer] on phone 10 am - 9:30 pm 5 days per week.   [employer] has contacted her but she did not return the call.   She is  under the care of Dr. Letta Pate for chronic pain. Requests refill of gabapentin.   Reviewed allergies, medications, past medical, surgical, family, and social history.    Review of Systems Pertinent positives and negatives in the history of present illness.     Objective:   Physical Exam Wt 210 lb (95.3 kg)   BMI 36.05 kg/m   Alert and oriented and in no acute distress.  No facial asymmetry.  Respirations unlabored.  Normal speech, mood and thought process.      Assessment & Plan:  Essential hypertension - Plan: lisinopril (ZESTRIL) 10 MG tablet, hydrochlorothiazide (MICROZIDE) 12.5 MG capsule, amLODipine (NORVASC) 10 MG tablet She is unable to check her blood pressure at home.  I would like for her to check with her insurance carrier or [facility] in regards to getting a blood pressure cuff Continue on current medication regimen.  Family history of heart disease-  At risk for heart disease- she has several family members including her daughter with heart disease and heart attacks.  Discussed that she has multiple risk factors for heart disease and in light of her recent emergency department visit for shortness of breath, I recommend further evaluation by cardiology.  She reports seeing cardiology many years ago in [place]  Uncontrolled type 2 diabetes with neuropathy ([place]) -Continue close follow-up with Dr. Cruzita Lederer for diabetes  Morbid obesity ([place]) -Discussed  healthy diet and portion control.  She is unable to exercise due to chronic back pain and left leg weakness  Weakness of left leg-continue using wheelchair and close follow-up with her back specialist and pain management  Inability to walk-see above note  Mixed hyperlipidemia - Plan: lovastatin (MEVACOR) 40 MG tablet -Continue statin therapy  Dr. Wynn Banker will need to refill her gabapentin.   Discussed that FMLA for her daughter to help her with cleaning and chores would not be appropriate use.  Encouraged her to ask her  daughter to help her on the weekend or in her off time.  Encouraged her to call [employer] back so that they may offer assistance as appropriate.

## 2018-12-04 DIAGNOSIS — Z8249 Family history of ischemic heart disease and other diseases of the circulatory system: Secondary | ICD-10-CM

## 2018-12-04 DIAGNOSIS — I700 Atherosclerosis of aorta: Secondary | ICD-10-CM | POA: Insufficient documentation

## 2018-12-04 DIAGNOSIS — Z9189 Other specified personal risk factors, not elsewhere classified: Secondary | ICD-10-CM | POA: Insufficient documentation

## 2018-12-04 HISTORY — DX: Family history of ischemic heart disease and other diseases of the circulatory system: Z82.49

## 2018-12-07 ENCOUNTER — Other Ambulatory Visit: Payer: Self-pay | Admitting: Physical Medicine & Rehabilitation

## 2018-12-27 DIAGNOSIS — G8220 Paraplegia, unspecified: Secondary | ICD-10-CM | POA: Diagnosis not present

## 2018-12-27 DIAGNOSIS — G992 Myelopathy in diseases classified elsewhere: Secondary | ICD-10-CM | POA: Diagnosis not present

## 2018-12-27 DIAGNOSIS — M4804 Spinal stenosis, thoracic region: Secondary | ICD-10-CM | POA: Diagnosis not present

## 2018-12-27 DIAGNOSIS — L8995 Pressure ulcer of unspecified site, unstageable: Secondary | ICD-10-CM | POA: Diagnosis not present

## 2018-12-28 ENCOUNTER — Ambulatory Visit (INDEPENDENT_AMBULATORY_CARE_PROVIDER_SITE_OTHER): Payer: Medicare Other | Admitting: Internal Medicine

## 2018-12-28 ENCOUNTER — Encounter: Payer: Self-pay | Admitting: Internal Medicine

## 2018-12-28 ENCOUNTER — Other Ambulatory Visit: Payer: Self-pay

## 2018-12-28 VITALS — BP 126/70 | HR 82 | Ht 64.0 in | Wt 210.0 lb

## 2018-12-28 DIAGNOSIS — E1140 Type 2 diabetes mellitus with diabetic neuropathy, unspecified: Secondary | ICD-10-CM

## 2018-12-28 DIAGNOSIS — E538 Deficiency of other specified B group vitamins: Secondary | ICD-10-CM

## 2018-12-28 DIAGNOSIS — E1165 Type 2 diabetes mellitus with hyperglycemia: Secondary | ICD-10-CM | POA: Diagnosis not present

## 2018-12-28 DIAGNOSIS — IMO0002 Reserved for concepts with insufficient information to code with codable children: Secondary | ICD-10-CM

## 2018-12-28 DIAGNOSIS — E782 Mixed hyperlipidemia: Secondary | ICD-10-CM

## 2018-12-28 LAB — POCT GLYCOSYLATED HEMOGLOBIN (HGB A1C): Hemoglobin A1C: 9.8 % — AB (ref 4.0–5.6)

## 2018-12-28 LAB — LIPID PANEL
Cholesterol: 173 mg/dL (ref 0–200)
HDL: 61.2 mg/dL (ref >39.00)
LDL Cholesterol: 82 mg/dL (ref 0–99)
NonHDL: 112.17
Total CHOL/HDL Ratio: 3
Triglycerides: 153 mg/dL — ABNORMAL HIGH (ref 0.0–149.0)
VLDL: 30.6 mg/dL (ref 0.0–40.0)

## 2018-12-28 LAB — VITAMIN B12: Vitamin B-12: >1500 pg/mL — ABNORMAL HIGH (ref 211–911)

## 2018-12-28 MED ORDER — METFORMIN HCL ER 500 MG PO TB24: 500.0000 mg | ORAL_TABLET | Freq: Two times a day (BID) | ORAL | 3 refills | Status: DC

## 2018-12-28 MED ORDER — TOUJEO SOLOSTAR 300 UNIT/ML ~~LOC~~ SOPN: 35.0000 [IU] | PEN_INJECTOR | Freq: Every day | SUBCUTANEOUS | 3 refills | Status: DC

## 2018-12-28 MED ORDER — CONTOUR NEXT TEST VI STRP: ORAL_STRIP | 12 refills | Status: DC

## 2018-12-28 MED ORDER — INSULIN LISPRO (1 UNIT DIAL) 100 UNIT/ML (KWIKPEN): 7.0000 [IU] | PEN_INJECTOR | Freq: Three times a day (TID) | SUBCUTANEOUS | 3 refills | Status: DC

## 2018-12-28 NOTE — Patient Instructions (Addendum)
Please continue: - Metformin ER 500 mg 1-2x a day with meals. - Trulicity 1.5 mg weekly  Please increase: - Toujeo 35 units at night - Humalog 7-10 units before the meals  Start checking sugars 4x a day.  Please continue B12 5000 mcg daily.  Please return in 3 months with your sugar log.

## 2018-12-28 NOTE — Progress Notes (Signed)
-Patient ID: Cassandra Stewart, female   DOB: 1950-08-17, 68 y.o.   MRN: [id number]   HPI: Cassandra Stewart is a 68 y.o.-year-old female, returning for follow-up for DM2, dx in 1992, insulin-dependent since 2006, uncontrolled, with complications (CKD stage 3, PN, DR).  Last visit 5 months ago.  Since last visit, she was in the ED with SOB and upper chest pain along with R hand pain.  Reviewed her chart: Troponins were not elevated.  At last visit sugars were higher due to the coronavirus pandemic and not being able to go out and buy fresh foods.  Reviewed HbA1c levels: Lab Results  Component Value Date   HGBA1C 10.7 (A) 07/13/2018   HGBA1C 6.8 (A) 03/16/2018   HGBA1C 6.1 (A) 11/12/2017   HGBA1C 9.0 07/11/2017   HGBA1C 9.0 04/14/2017   HGBA1C 8.7% 10/24/2016   HGBA1C 9.6% 07/25/2016   HGBA1C 10.5% 03/04/2016   HGBA1C 8.1% 10/25/2015   HGBA1C 9.7 (H) 07/04/2015  03/16/2018: HbA1c calculated from fructosamine 6.28%  Pt was on a regimen of: - Metformin 500 mg 1x a day with dinner.  She had diarrhea with a higher dose. - Toujeo 45 units in am - Humalog 18 units 2-3x a day, before meals Tried: Actos, Lantus.  Now on: - Metformin ER 500 mg 1-2x a day with meals. - Trulicity 1.5 mg weekly - Toujeo 30 units at night - Humalog 5-7 units before the meals  She was checking sugars 1-2 times a day >> stopped as her meter is not working... - am: 140-183 >> 63, 74-91 >> 90-103 >> 90-100  - 2h after b'fast: n/c - before lunch: n/c >> 80-110 >> n/c >> 60s, 70 - 2h after lunch: n/c - before dinner: n/c >>  80-110 >> 90-100 >> 188 - 2h after dinner: n/c - bedtime: n/c - nighttime: n/c Lowest sugar was 60s >> ? ; she has hypoglycemia awareness in the 80s. Highest sugar was 188 >> ?Marland Kitchen  Glucometer: Freestyle  Pt's meals are: - Breakfast/brunch: egg, bacon, toast >> no b'fast now - Lunch: snack mostly (no Humalog) >> Meals on Wheels - Dinner: chicken/fish + vegetables - Snacks: no  -+  Stage III CKD; last BUN/creatinine:  Lab Results  Component Value Date   BUN 15 11/30/2018   BUN 18 09/18/2018   CREATININE 1.11 (H) 11/30/2018   CREATININE 1.07 09/18/2018   Lab Results  Component Value Date   GFRAA 58 (L) 11/30/2018   GFRAA 58 (L) 11/23/2017   GFRAA 62 11/05/2017   GFRAA 57 (L) 10/24/2016   GFRAA 57 (L) 06/30/2016  On lisinopril 10. -+ HL; last set of lipids: Lab Results  Component Value Date   CHOL 147 11/05/2017   HDL 59 11/05/2017   LDLCALC 61 11/05/2017   TRIG 133 11/05/2017   CHOLHDL 2.5 11/05/2017  On lovastatin 40. - last eye exam was in 11/2017: + DR.  She has history of cataract surgery OU. -+ Numbness and tingling in her feet.  On Neurontin 300 mg 3 times a day. On ASA 81.  Low vitamin B12:  Reviewed her B12 levels: Lab Results  Component Value Date   VITAMINB12 261 03/16/2018   VITAMINB12 300 04/14/2017   VITAMINB12 478 10/25/2015  05/01/2016: Vit B12 248.  She continues on 5000 mcg B12 daily.  She refused injections.  She continues to be in a wheelchair as her left leg is very weak.  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  She had  surgery in 2016 but strength did not improve.  She has an extensive FH of heart disease - women in her family. Daughter died after her heart stopped.  ROS: Constitutional: no weight gain/no weight loss, no fatigue, no subjective hyperthermia, no subjective hypothermia Eyes: no blurry vision, no xerophthalmia ENT: no sore throat, no nodules palpated in neck, no dysphagia, no odynophagia, no hoarseness Cardiovascular: no CP/no SOB/no palpitations/no leg swelling Respiratory: no cough/no SOB/no wheezing Gastrointestinal: no N/no V/no D/no C/no acid reflux Musculoskeletal: no muscle aches/no joint aches Skin: no rashes, no hair loss Neurological: no tremors/no numbness/no tingling/no dizziness  I reviewed pt's medications, allergies, PMH, social hx, family hx, and changes were  documented in the history of present illness. Otherwise, unchanged from my initial visit note.  Past Medical History:  Diagnosis Date  . Elevated LFTs   . Fatty liver    on [place].   Marland Kitchen Hyperlipemia   . Hypertension   . Uncontrolled type 2 diabetes with neuropathy [facility])    Past Surgical History:  Procedure Laterality Date  . APPENDECTOMY  1969  . BREAST BIOPSY Right   . CATARACT EXTRACTION     May 21016  . LUMBAR DISC SURGERY    . LUMBAR LAMINECTOMY/DECOMPRESSION MICRODISCECTOMY N/A 07/06/2015   Procedure: Thoracic ten- eleven laminectomy for spinal canal decompression;  Surgeon: Ashok Pall, MD;  Location: [facility];  Service: Orthopedics;  Laterality: N/A;   Social History   Social History  . Marital status: Divorced    Spouse name: N/A  . Number of children: 0   Occupational History  . None   Social History Main Topics  . Smoking status: Never Smoker  . Smokeless tobacco: Never Used  . Alcohol use No  . Drug use: No   Current Outpatient Medications on File Prior to Visit  Medication Sig Dispense Refill  . ACCU-CHEK SOFTCLIX LANCETS lancets Test 3 times daily. Pt uses accu-chek softclix lancing device 200 each 3  . acetaminophen (ACETAMINOPHEN 8 HOUR) 650 MG CR tablet Take 650 mg by mouth every 8 (eight) hours as needed for pain.    Marland Kitchen albuterol (PROVENTIL HFA) 108 (90 Base) MCG/ACT inhaler Inhale 1-2 puffs into the lungs every 4 (four) hours as needed for wheezing or shortness of breath.     Marland Kitchen amLODipine (NORVASC) 10 MG tablet Take 1 tablet (10 mg total) by mouth every morning. 90 tablet 0  . aspirin EC 81 MG tablet Take 81 mg by mouth every evening.    . Blood Glucose Monitoring Suppl (ACCU-CHEK AVIVA PLUS) w/Device KIT Use as advised 1 kit 1  . Cholecalciferol (VITAMIN D PO) Take 1,000 mg by mouth daily.     . Cyanocobalamin (VITAMIN B 12 PO) Take 5,000 mg by mouth daily.    Marland Kitchen dicyclomine (BENTYL) 10 MG capsule Take 1 capsule (10 mg total) by mouth every 8 (eight)  hours as needed for spasms. 90 capsule 1  . Dulaglutide (TRULICITY) 1.5 NZ/9.7KQ SOPN Inject 1.5 mg into the skin once a week. (Patient taking differently: Inject 1.5 mg into the skin every Friday. ) 4 pen 11  . gabapentin (NEURONTIN) 300 MG capsule TAKE 1 CAPSULE(300 MG) BY MOUTH THREE TIMES DAILY 270 capsule 0  . glucose blood (ACCU-CHEK AVIVA PLUS) test strip Use as instructed 4x a day 200 each 11  . hydrochlorothiazide (MICROZIDE) 12.5 MG capsule TAKE 1 CAPSULE(12.5 MG) BY MOUTH DAILY 90 capsule 1  . insulin lispro (HUMALOG KWIKPEN) 100 UNIT/ML KwikPen Inject 0.05-0.07 mLs (  5-7 Units total) into the skin 3 (three) times daily with meals. 15 mL 11  . Insulin Pen Needle (CAREFINE PEN NEEDLES) 32G X 4 MM MISC Use 4x a day 300 each 3  . lisinopril (ZESTRIL) 10 MG tablet Take 1 tablet (10 mg total) by mouth every morning. 90 tablet 0  . lovastatin (MEVACOR) 40 MG tablet Take 1 tablet (40 mg total) by mouth at bedtime. 90 tablet 1  . metFORMIN (GLUCOPHAGE-XR) 500 MG 24 hr tablet TAKE 1 TABLET(500 MG) BY MOUTH TWICE DAILY WITH A MEAL (Patient taking differently: Take 500 mg by mouth 2 (two) times daily. ) 180 tablet 0  . polyethylene glycol (MIRALAX / GLYCOLAX) 17 g packet Take 17 g by mouth daily. Take once to twice daily (Patient taking differently: Take 17 g by mouth daily as needed for mild constipation. ) 14 each 0  . TOUJEO SOLOSTAR 300 UNIT/ML SOPN Inject 30 Units into the skin at bedtime. 10 pen 4   No current facility-administered medications on file prior to visit.    Allergies  Allergen Reactions  . Other     PT PREFERS TO NOT HAVE ANY NARCOTIC MEDICATIONS  . Chlorhexidine Rash    Burning after using CHG wipes-used for surgery  . Oxycodone Hcl     Other reaction(s): Hallucination Marked hallucinations and palpitations following dose of 50m on 04/30/2015    Family History  Problem Relation Age of Onset  . Diabetes Mother   . Heart disease Mother   . Kidney disease Brother         Two brothers on ESRD  . Cancer Sister   . Amblyopia Neg Hx   . Blindness Neg Hx   . Cataracts Neg Hx   . Glaucoma Neg Hx   . Macular degeneration Neg Hx   . Retinal detachment Neg Hx   . Strabismus Neg Hx   . Retinitis pigmentosa Neg Hx   . Colon cancer Neg Hx   . Esophageal cancer Neg Hx    Pt has FH of DM in M, MGM, PGM, M aunt, uncles.  PE: BP 126/70   Pulse 82   Ht 5' 4"  (1.626 m)   Wt 210 lb (95.3 kg) Comment: per patient  SpO2 98%   BMI 36.05 kg/m  Wt Readings from Last 3 Encounters:  12/28/18 210 lb (95.3 kg)  12/03/18 210 lb (95.3 kg)  09/11/18 220 lb (99.8 kg)   Constitutional: overweight, in NAD, in wheelchair Eyes: PERRLA, EOMI, no exophthalmos ENT: moist mucous membranes, no thyromegaly, no cervical lymphadenopathy Cardiovascular: RRR, No MRG Respiratory: CTA B Gastrointestinal: abdomen soft, NT, ND, BS+ Musculoskeletal: no deformities, strength intact in all 4 Skin: moist, warm, no rashes Neurological: no tremor with outstretched hands, DTR normal in all 4  ASSESSMENT: 1. DM2, insulin-dependent, uncontrolled, with complications - CKD stage 3 - PN - DR  - Of note, we tried to prescribe the Freestyle libre CGM for her but this was not covered by the insurance  2. Low B12  3. HL  PLAN:  1. Patient with longstanding, uncontrolled, type 2 diabetes, on oral antidiabetic regimen, and also on weekly GLP-1 receptor agonist and basal-bolus insulin regimen.  After addition of Trulicity, her sugars improved significantly and she was even having low blood sugars on the lower dose of GLP-1 receptor agonist.  She was also trying to adopt a plant-based diet which helped significantly.  However, at last visit, sugars were higher as her diet was worse during the  coronavirus pandemic and we had to add back low-dose Humalog.  At that time, HbA1c increased significantly to 10.7%. -At this visit, she is not checking sugars as her meter is broken.  However HbA1c: 9.8%  (lower), however, this is still elevated.  I advised her to increase the Toujeo and the Humalog.  At next visit, we may be able to increase Trulicity to 3 mg weekly, but for now, she just received a batch of Trulicity and would want to continue with the same dose.  I advised her to start checking sugars 4 times a day and we gave her a Contour next meter - I suggested to:  Patient Instructions  Please continue: - Metformin ER 500 mg 1-2x a day with meals. - Trulicity 1.5 mg weekly  Please increase: - Toujeo 35 units at night - Humalog 7-10 units before the meals  Start checking sugars 4x a day.  Please continue B12 5000 mcg daily.  Please return in 3 months with your sugar log.   - advised to check sugars at different times of the day - 4x a day, rotating check times.  At next visit, we may be able to get her a freestyle libre CGM. - advised for yearly eye exams >> she is not UTD - return to clinic in 3 months   2. Low B12 -She has a history of low vitamin B12 and has been on supplementation with p.o. vitamin B12. -At last check, the level was at the lower limit of normal 02/2018 -She refused injections so we continued 5000 units p.o. vitamin B12 daily -We will recheck this today  3. HL -Reviewed latest lipid panel from 10/2017: All fractions at goal Lab Results  Component Value Date   CHOL 147 11/05/2017   HDL 59 11/05/2017   LDLCALC 61 11/05/2017   TRIG 133 11/05/2017   CHOLHDL 2.5 11/05/2017  -Continues Mevacor without side effects. -We will recheck this today-she is fasting  Component     Latest Ref Rng & Units 12/28/2018  Cholesterol     0 - 200 mg/dL 173  Triglycerides     0.0 - 149.0 mg/dL 153.0 (H)  HDL Cholesterol     >39.00 mg/dL 61.20  VLDL     0.0 - 40.0 mg/dL 30.6  LDL (calc)     0 - 99 mg/dL 82  Total CHOL/HDL Ratio      3  NonHDL      112.17  Vitamin B12     211 - 911 pg/mL >1500 (H)   Triglycerides slightly high, the rest of the fractions are at  goal, but LDL did increase since last check.  Improving her diabetes will most likely help, also. Her vitamin B12 is too high, I will advise her to decrease the dose of B12 supplement to 2500 mcg daily.  Philemon Kingdom, MD PhD [facility] Endocrinology

## 2018-12-29 ENCOUNTER — Telehealth: Payer: Self-pay

## 2018-12-29 NOTE — Telephone Encounter (Signed)
Notified patient of message from Dr. Gherghe, patient expressed understanding and agreement. No further questions.  

## 2018-12-29 NOTE — Telephone Encounter (Signed)
-----   Message from Philemon Kingdom, MD sent at 12/29/2018 11:00 AM EST ----- Lenna Sciara, can you please call pt:  Triglycerides slightly high, the rest of the fractions are at goal, but LDL did increase since last check.  Improving her diabetes will most likely help, also.  Please continue the Mevacor. Her vitamin B12 has recovered, but the level has increased above the upper limit of normal, so please  advise her to decrease the dose of B12 supplement to 2500 mcg daily.

## 2019-01-13 ENCOUNTER — Ambulatory Visit (INDEPENDENT_AMBULATORY_CARE_PROVIDER_SITE_OTHER): Payer: Medicare Other | Admitting: Internal Medicine

## 2019-01-13 ENCOUNTER — Other Ambulatory Visit: Payer: Self-pay

## 2019-01-13 ENCOUNTER — Encounter: Payer: Self-pay | Admitting: Internal Medicine

## 2019-01-13 VITALS — BP 156/75 | HR 72 | Ht 64.0 in | Wt 226.0 lb

## 2019-01-13 DIAGNOSIS — Z01812 Encounter for preprocedural laboratory examination: Secondary | ICD-10-CM

## 2019-01-13 DIAGNOSIS — E782 Mixed hyperlipidemia: Secondary | ICD-10-CM | POA: Diagnosis not present

## 2019-01-13 DIAGNOSIS — R072 Precordial pain: Secondary | ICD-10-CM | POA: Diagnosis not present

## 2019-01-13 DIAGNOSIS — I10 Essential (primary) hypertension: Secondary | ICD-10-CM

## 2019-01-13 DIAGNOSIS — G4733 Obstructive sleep apnea (adult) (pediatric): Secondary | ICD-10-CM

## 2019-01-13 MED ORDER — NITROGLYCERIN 0.4 MG SL SUBL: 0.4000 mg | SUBLINGUAL_TABLET | SUBLINGUAL | 3 refills | Status: AC | PRN

## 2019-01-13 MED ORDER — METOPROLOL TARTRATE 100 MG PO TABS: ORAL_TABLET | ORAL | 0 refills | Status: DC

## 2019-01-13 NOTE — Addendum Note (Signed)
Addended by: Fidel Levy on: 01/13/2019 05:16 PM   Modules accepted: Orders

## 2019-01-13 NOTE — Patient Instructions (Addendum)
Medication Instructions:  Your physician recommends that you continue on your current medications as directed. Please refer to the Current Medication list given to you today.  *If you need a refill on your cardiac medications before your next appointment, please call your pharmacy*  Lab Work: BMET prior to CT test If you have labs (blood work) drawn today and your tests are completely normal, you will receive your results only by: Marland Kitchen MyChart Message (if you have MyChart) OR . A paper copy in the mail If you have any lab test that is abnormal or we need to change your treatment, we will call you to review the results.  Testing/Procedures: Coronary CT @ [facility] - you will be called to schedule this once approved with insurance  Sleep Study - either at [facility] or a home sleep study will be scheduled. Our office sleep coordinator Mariann Laster will be in touch with you about this  Follow-Up: At [facility], you and your health needs are our priority.  As part of our continuing mission to provide you with exceptional heart care, we have created designated Provider Care Teams.  These Care Teams include your primary Cardiologist (physician) and Advanced Practice Providers (APPs -  Physician Assistants and Nurse Practitioners) who all work together to provide you with the care you need, when you need it.  Your next appointment:   6 week(s) - after CT  The format for your next appointment:   Either In Person or Virtual  Provider:   Raliegh Ip Mali Hilty, MD  Other Instructions  Your cardiac CT will be scheduled at one of the below locations:   [facility] [address] [place], [place] [address] [phone]   If scheduled at [facility], please arrive at the [facility] main entrance of [facility] 30-45 minutes prior to test start time. Proceed to the [facility] Radiology Department (first floor) to check-in and test prep.   Please follow these  instructions carefully (unless otherwise directed):    On the Night Before the Test: . Be sure to Drink plenty of water. . Do not consume any caffeinated/decaffeinated beverages or chocolate 12 hours prior to your test. . Do not take any antihistamines 12 hours prior to your test. .   On the Day of the Test: . Drink plenty of water. Do not drink any water within one hour of the test. . Do not eat any food 4 hours prior to the test. . You may take your regular medications prior to the test.  . Take metoprolol (Lopressor) two hours prior to test. . HOLD Furosemide/Hydrochlorothiazide morning of the test. . FEMALES- please wear underwire-free bra if available       After the Test: . Drink plenty of water. . After receiving IV contrast, you may experience a mild flushed feeling. This is normal. . On occasion, you may experience a mild rash up to 24 hours after the test. This is not dangerous. If this occurs, you can take Benadryl 25 mg and increase your fluid intake. . If you experience trouble breathing, this can be serious. If it is severe call 911 IMMEDIATELY. If it is mild, please call our office. . If you take any of these medications: Glipizide/Metformin, Avandament, Glucavance, please do not take 48 hours after completing test unless otherwise instructed.   Once we have confirmed authorization from your insurance company, we will call you to set up a date and time for your test.  For non-scheduling related questions, please contact the cardiac imaging nurse navigator should you have any questions/concerns: Marchia Bond, RN Navigator Cardiac Imaging [facility] [phone] Office

## 2019-01-13 NOTE — Progress Notes (Signed)
OFFICE CONSULT NOTE  Chief Complaint:  Chest pain  Primary Care Physician: Girtha Rm, NP-C  HPI:  Cassandra Stewart is a 68 y.o. female who is being seen today for the evaluation of chest pain at the request of Raenette Rover, Vickie L, NP-C. This is a pleasant 68 year old female who was previously living in [place].  She had seen cardiology there and had to stress test back in 2015 and 2017 which was negative for ischemia.  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  She also has hypertension, dyslipidemia, uncontrolled diabetes (now followed by Dr. Letta Median), as well as a family history of coronary disease in her mom, grandmother and women side of the family.  Recently she had been having some chest discomfort.  She had an episode of sharp chest discomfort in the right upper chest at the base of her neck.  She called EMS and was given nitroglycerin which caused immediate improvement in her symptoms.  Due to concern that this was potentially cardiac, she was referred to cardiology for further evaluation.  She also has reported some recent increase in shortness of breath.  She is not able to be active.  PMHx:  Past Medical History:  Diagnosis Date  . Elevated LFTs   . Fatty liver    on [place].   Marland Kitchen Hyperlipemia   . Hypertension   . Uncontrolled type 2 diabetes with neuropathy [facility])     Past Surgical History:  Procedure Laterality Date  . APPENDECTOMY  1969  . BREAST BIOPSY Right   . CATARACT EXTRACTION     May 21016  . LUMBAR DISC SURGERY    . LUMBAR LAMINECTOMY/DECOMPRESSION MICRODISCECTOMY N/A 07/06/2015   Procedure: Thoracic ten- eleven laminectomy for spinal canal decompression;  Surgeon: Ashok Pall, MD;  Location: [place] [facility];  Service: Orthopedics;  Laterality: N/A;    FAMHx:  Family History  Problem Relation Age of Onset  . Diabetes Mother   . Heart  disease Mother   . Kidney disease Brother        Two brothers on ESRD  . Cancer Sister   . Amblyopia Neg Hx   . Blindness Neg Hx   . Cataracts Neg Hx   . Glaucoma Neg Hx   . Macular degeneration Neg Hx   . Retinal detachment Neg Hx   . Strabismus Neg Hx   . Retinitis pigmentosa Neg Hx   . Colon cancer Neg Hx   . Esophageal cancer Neg Hx     SOCHx:   reports that she has never smoked. She has never used smokeless tobacco. She reports that she does not drink alcohol or use drugs.  ALLERGIES:  Allergies  Allergen Reactions  . Other     PT PREFERS TO NOT HAVE ANY NARCOTIC MEDICATIONS  . Chlorhexidine Rash    Burning after using CHG wipes-used for surgery  . Oxycodone Hcl     Other reaction(s): Hallucination Marked hallucinations and palpitations following dose of 48m on 04/30/2015     ROS: Pertinent items noted in HPI and remainder of comprehensive ROS otherwise negative.  HOME MEDS: Current Outpatient Medications on File Prior to Visit  Medication Sig Dispense Refill  . ACCU-CHEK SOFTCLIX LANCETS lancets Test 3 times daily. Pt uses accu-chek softclix lancing device 200 each 3  . acetaminophen (ACETAMINOPHEN 8 HOUR) 650 MG CR tablet Take 650 mg by mouth every 8 (  eight) hours as needed for pain.    Marland Kitchen albuterol (PROVENTIL HFA) 108 (90 Base) MCG/ACT inhaler Inhale 1-2 puffs into the lungs every 4 (four) hours as needed for wheezing or shortness of breath.     Marland Kitchen amLODipine (NORVASC) 10 MG tablet Take 1 tablet (10 mg total) by mouth every morning. 90 tablet 0  . aspirin EC 81 MG tablet Take 81 mg by mouth every evening.    . Blood Glucose Monitoring Suppl (ACCU-CHEK AVIVA PLUS) w/Device KIT Use as advised 1 kit 1  . Cholecalciferol (VITAMIN D PO) Take 1,000 mg by mouth daily.     . Cyanocobalamin (VITAMIN B 12 PO) Take 5,000 mg by mouth daily.    Marland Kitchen dicyclomine (BENTYL) 10 MG capsule Take 1 capsule (10 mg total) by mouth every 8 (eight) hours as needed for spasms. 90 capsule 1  .  Dulaglutide (TRULICITY) 1.5 CL/2.7NT SOPN Inject 1.5 mg into the skin once a week. 4 pen 11  . gabapentin (NEURONTIN) 300 MG capsule TAKE 1 CAPSULE(300 MG) BY MOUTH THREE TIMES DAILY 270 capsule 0  . glucose blood (CONTOUR NEXT TEST) test strip Use to check blood sugar 4 times a day. 400 each 12  . hydrochlorothiazide (MICROZIDE) 12.5 MG capsule TAKE 1 CAPSULE(12.5 MG) BY MOUTH DAILY 90 capsule 1  . insulin lispro (HUMALOG KWIKPEN) 100 UNIT/ML KwikPen Inject 0.07-0.1 mLs (7-10 Units total) into the skin 3 (three) times daily with meals. 30 mL 3  . Insulin Pen Needle (CAREFINE PEN NEEDLES) 32G X 4 MM MISC Use 4x a day 300 each 3  . lisinopril (ZESTRIL) 10 MG tablet Take 1 tablet (10 mg total) by mouth every morning. 90 tablet 0  . lovastatin (MEVACOR) 40 MG tablet Take 1 tablet (40 mg total) by mouth at bedtime. 90 tablet 1  . metFORMIN (GLUCOPHAGE-XR) 500 MG 24 hr tablet Take 1 tablet (500 mg total) by mouth 2 (two) times daily. 180 tablet 3  . polyethylene glycol (MIRALAX / GLYCOLAX) 17 g packet Take 17 g by mouth as needed.    Nelva Nay SOLOSTAR 300 UNIT/ML SOPN Inject 35 Units into the skin at bedtime. 12 pen 3   No current facility-administered medications on file prior to visit.     LABS/IMAGING: No results found for this or any previous visit (from the past 48 hour(s)). No results found.  LIPID PANEL:    Component Value Date/Time   CHOL 173 12/28/2018 1103   CHOL 147 11/05/2017 1210   TRIG 153.0 (H) 12/28/2018 1103   HDL 61.20 12/28/2018 1103   HDL 59 11/05/2017 1210   CHOLHDL 3 12/28/2018 1103   VLDL 30.6 12/28/2018 1103   LDLCALC 82 12/28/2018 1103   LDLCALC 61 11/05/2017 1210    WEIGHTS: Wt Readings from Last 3 Encounters:  01/13/19 226 lb (102.5 kg)  12/28/18 210 lb (95.3 kg)  12/03/18 210 lb (95.3 kg)    VITALS: BP (!) 156/75   Pulse 72   Ht 5' 4"  (1.626 m)   Wt 226 lb (102.5 kg)   SpO2 98%   BMI 38.79 kg/m   EXAM: General appearance: alert, no distress and  morbidly obese Neck: no carotid bruit, no JVD and thyroid not enlarged, symmetric, no tenderness/mass/nodules Lungs: diminished breath sounds bilaterally Heart: regular rate and rhythm, S1, S2 normal, no murmur, click, rub or gallop Abdomen: soft, non-tender; bowel sounds normal; no masses,  no organomegaly and obese Extremities: extremities normal, atraumatic, no cyanosis or edema Pulses: 2+ and  symmetric Skin: Skin color, texture, turgor normal. No rashes or lesions Neurologic: Mental status: Alert, oriented, thought content appropriate, bilateral LE weakness, in wheelchair Psych: Pleasant  EKG: Normal sinus rhythm 72, low voltage QRS- personally reviewed  ASSESSMENT: 1. Chest pain, possible unstable angina 2. Morbid obesity 3. Lower extremity neuropathy (wheelchair-bound) 4. Hypertension 5. Type 2 diabetes-uncontrolled 6. Dyslipidemia 7. Family history of premature coronary disease 8. History of OSA (not on CPAP)  PLAN: 1.   Ms. Weyers has multiple medical problems and risk factors for coronary disease.  Recently she had some atypical sounding chest pain however it improved quickly with nitroglycerin.  Her EKG is nonischemic.  She had 2 - stress test in 2015 and 2017.  Is possible that she has developed worsening coronary disease or perhaps that the tests have underestimated her disease significance.  Although she is moderately obese, I think we could possibly image her chest with reasonable images via CT.  Recommend CT with FFR.  Also, she has a history of obstructive sleep apnea is recommended to be either on CPAP or BiPAP in 2016 but was never fitted with equipment.  I like to repeat that sleep study since it was previously abnormal and her weight has gone up.  Plan follow-up with me afterwards.  Thanks again for the kind referral.  Pixie Casino, MD, FACC, [place] Director of the [facility]  [facility] Diplomate of the American Board of Clinical Lipidology Attending Cardiologist  Direct Dial: [phone]  Fax: [phone]  Website:  [email or web address] 01/13/2019, 5:06 PM

## 2019-01-14 ENCOUNTER — Other Ambulatory Visit: Payer: Self-pay

## 2019-01-14 ENCOUNTER — Encounter: Payer: Self-pay | Admitting: Registered Nurse

## 2019-01-14 ENCOUNTER — Encounter: Payer: Medicare Other | Attending: Registered Nurse | Admitting: Registered Nurse

## 2019-01-14 VITALS — BP 121/67 | HR 77 | Temp 96.4°F

## 2019-01-14 DIAGNOSIS — M4714 Other spondylosis with myelopathy, thoracic region: Secondary | ICD-10-CM

## 2019-01-14 DIAGNOSIS — G8220 Paraplegia, unspecified: Secondary | ICD-10-CM | POA: Diagnosis not present

## 2019-01-14 NOTE — Progress Notes (Signed)
Subjective:    Patient ID: Cassandra Stewart, female    DOB: Oct 14, 1950, 68 y.o.   MRN: [id number]  HPI: Cassandra Stewart is a 68 y.o. female who returns for follow up appointment for chronic pain and medication refill. She states she's having abdominal spasms and gastroenterologist is following. She rates her rates pain 3.   [name] has not receive the manual wheelchair that was ordered on August 26.2020. This provider was in contact with Cassandra Stewart today, Cassandra Stewart reached out to [employer]. Henderson Newcomer from [employer] stated  they never received the request for [name] manual wheelchair. We will order a 18x 18 light weight manual wheel chair with pressure relieving gel cushion with foot rests, brake extensions and antitippers. Ms. Cassandra Stewart has mobility limitations which cannot be resolved with a cane,crutch or walker. Ms. Cassandra Stewart can safely propel the lightweight wheelchair, but not a heavier standard weight wheelchair.    Pain Inventory Average Pain 3 Pain Right Now 3 My pain is dull  In the last 24 hours, has pain interfered with the following? General activity 3 Relation with others 0 Enjoyment of life 5 What TIME of day is your pain at its worst? all Sleep (in general) Fair  Pain is worse with: unsure Pain improves with: rest Relief from Meds: na  Mobility ability to climb steps?  no do you drive?  no  Function disabled: date disabled na  Neuro/Psych spasms  Prior Studies Any changes since last visit?  no  Physicians involved in your care Any changes since last visit?  no   Family History  Problem Relation Age of Onset  . Diabetes Mother   . Heart disease Mother   . Kidney disease Brother        Two brothers on ESRD  . Cancer Sister   . Amblyopia Neg Hx   . Blindness Neg Hx   . Cataracts Neg Hx   . Glaucoma Neg Hx   . Macular degeneration Neg Hx   . Retinal detachment Neg Hx   . Strabismus Neg Hx   . Retinitis pigmentosa Neg Hx    . Colon cancer Neg Hx   . Esophageal cancer Neg Hx    Social History   Socioeconomic History  . Marital status: Divorced    Spouse name: Not on file  . Number of children: Not on file  . Years of education: Not on file  . Highest education level: Not on file  Occupational History  . Not on file  Social Needs  . Financial resource strain: Not on file  . Food insecurity    Worry: Not on file    Inability: Not on file  . Transportation needs    Medical: Not on file    Non-medical: Not on file  Tobacco Use  . Smoking status: Never Smoker  . Smokeless tobacco: Never Used  Substance and Sexual Activity  . Alcohol use: No  . Drug use: No  . Sexual activity: Not Currently  Lifestyle  . Physical activity    Days per week: Not on file    Minutes per session: Not on file  . Stress: Not on file  Relationships  . Social Musician on phone: Not on file    Gets together: Not on file    Attends religious service: Not on file    Active member of club or organization: Not on file    Attends meetings of clubs or organizations: Not on file  Relationship status: Not on file  Other Topics Concern  . Not on file  Social History Narrative  . Not on file   Past Surgical History:  Procedure Laterality Date  . APPENDECTOMY  1969  . BREAST BIOPSY Right   . CATARACT EXTRACTION     May 21016  . LUMBAR DISC SURGERY    . LUMBAR LAMINECTOMY/DECOMPRESSION MICRODISCECTOMY N/A 07/06/2015   Procedure: Thoracic ten- eleven laminectomy for spinal canal decompression;  Surgeon: Ashok Pall, MD;  Location: [place] [facility];  Service: Orthopedics;  Laterality: N/A;   Past Medical History:  Diagnosis Date  . Elevated LFTs   . Fatty liver    on [place].   Marland Kitchen Hyperlipemia   . Hypertension   . Uncontrolled type 2 diabetes with neuropathy (HCC)    BP 121/67   Pulse 77   Temp (!) 96.4 F (35.8 C)   SpO2 95%   Opioid Risk Score:   Fall Risk Score:  `1  Depression screen PHQ 2/9   Depression screen [facility] 2/9 08/25/2018 11/05/2017 10/21/2017 09/09/2017 08/05/2016 08/01/2016 07/29/2016  Decreased Interest 0 0 0 0 0 0 0  Down, Depressed, Hopeless 0 3 0 0 0 0 0  PHQ - 2 Score 0 3 0 0 0 0 0   Review of Systems  Respiratory: Positive for shortness of breath.   Gastrointestinal: Positive for abdominal pain.  Musculoskeletal:       Spasms  All other systems reviewed and are negative.      Objective:   Physical Exam Vitals signs and nursing note reviewed.  Constitutional:      Appearance: Normal appearance.  Neck:     Musculoskeletal: Normal range of motion and neck supple.  Cardiovascular:     Rate and Rhythm: Normal rate and regular rhythm.     Pulses: Normal pulses.     Heart sounds: Normal heart sounds.  Pulmonary:     Effort: Pulmonary effort is normal.     Breath sounds: Normal breath sounds.  Musculoskeletal:     Comments: Normal Muscle Bulk and Muscle Testing Reveals:  Upper Extremities: Full ROM and Muscle Strength 5/5 Lower Extremities: Right: Full ROM and Muscle Strength 5/5 Left: Decreased ROM and Muscle Strength 4/5 Arrived in Motorized wheelchair   Skin:    General: Skin is warm and dry.  Neurological:     Mental Status: She is oriented to person, place, and time.           Assessment & Plan:  1. 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 Left lower extremity is weaker than right lower extremity. She is able to independently propel a wheelchair, her upper extremity function is normal. Her home does not accommodate the power wheelchair.  Continue with HEP as Tolerated.  RX: Manual Wheelchair,  15  minutes of face to face patient care time was spent during this visit. All questions were encouraged and answered.  F/U in 3 months

## 2019-01-19 ENCOUNTER — Telehealth: Payer: Self-pay | Admitting: *Deleted

## 2019-01-19 NOTE — Telephone Encounter (Signed)
Per [facility] web portal no PA is required for sleep study. Decision EX#:[id number].

## 2019-01-20 ENCOUNTER — Telehealth: Payer: Self-pay | Admitting: *Deleted

## 2019-01-20 NOTE — Telephone Encounter (Signed)
Contacted patient with in lab sleep study and [place] appointment details. She informed me that she does not want to do in lab study. It is too inconvenient for her to get transportation during the hours she has to be there. She states she already knows that she has OSA. She feels she may need a BIPAP instead of a CPAP. I explained to her she will have to have the studies to show failure of a CPAP machine before they will even give her a BIPAP. She will need to have a BIPAP titration which she will have to do in a sleep lab. Patient then stated that she has done without using a machine all of this time, does she really need to have it. I then explained to her the risks of untreated OSA. She still would like for me to ask Dr Debara Pickett if she really has to do this. Message will be routed to Dr Debara Pickett for review.

## 2019-01-22 NOTE — Telephone Encounter (Signed)
Yes .. she would benefit from treatment and needs a study to get treatment.   Dr. Lemmie Evens

## 2019-01-26 DIAGNOSIS — L8995 Pressure ulcer of unspecified site, unstageable: Secondary | ICD-10-CM | POA: Diagnosis not present

## 2019-01-26 DIAGNOSIS — G8220 Paraplegia, unspecified: Secondary | ICD-10-CM | POA: Diagnosis not present

## 2019-01-26 DIAGNOSIS — G992 Myelopathy in diseases classified elsewhere: Secondary | ICD-10-CM | POA: Diagnosis not present

## 2019-01-26 DIAGNOSIS — M4804 Spinal stenosis, thoracic region: Secondary | ICD-10-CM | POA: Diagnosis not present

## 2019-01-28 DIAGNOSIS — L8995 Pressure ulcer of unspecified site, unstageable: Secondary | ICD-10-CM | POA: Diagnosis not present

## 2019-01-28 DIAGNOSIS — M4804 Spinal stenosis, thoracic region: Secondary | ICD-10-CM | POA: Diagnosis not present

## 2019-01-28 DIAGNOSIS — G8220 Paraplegia, unspecified: Secondary | ICD-10-CM | POA: Diagnosis not present

## 2019-01-28 DIAGNOSIS — M4800 Spinal stenosis, site unspecified: Secondary | ICD-10-CM | POA: Diagnosis not present

## 2019-01-28 DIAGNOSIS — G992 Myelopathy in diseases classified elsewhere: Secondary | ICD-10-CM | POA: Diagnosis not present

## 2019-02-01 ENCOUNTER — Other Ambulatory Visit (HOSPITAL_COMMUNITY): Payer: Medicare Other

## 2019-02-03 ENCOUNTER — Encounter (HOSPITAL_BASED_OUTPATIENT_CLINIC_OR_DEPARTMENT_OTHER): Payer: Medicare Other | Admitting: Cardiovascular Disease

## 2019-02-05 NOTE — Telephone Encounter (Signed)
Telephoned patient and informed her Dr Debara Pickett does recommend that she have the in lab sleep study. She states that she agrees that she needs to have it, but she has a terrible fear of getting COVID. Once she feels the virus is better under control she will have the test done. Message routed to Dr Debara Pickett as a Juluis Rainier.

## 2019-02-05 NOTE — Telephone Encounter (Signed)
Ok thanks [place] - pls follow-up with her.  Dr Lemmie Evens

## 2019-02-26 DIAGNOSIS — G8220 Paraplegia, unspecified: Secondary | ICD-10-CM | POA: Diagnosis not present

## 2019-02-26 DIAGNOSIS — M4804 Spinal stenosis, thoracic region: Secondary | ICD-10-CM | POA: Diagnosis not present

## 2019-02-26 DIAGNOSIS — G992 Myelopathy in diseases classified elsewhere: Secondary | ICD-10-CM | POA: Diagnosis not present

## 2019-02-26 DIAGNOSIS — L8995 Pressure ulcer of unspecified site, unstageable: Secondary | ICD-10-CM | POA: Diagnosis not present

## 2019-02-28 DIAGNOSIS — G8220 Paraplegia, unspecified: Secondary | ICD-10-CM | POA: Diagnosis not present

## 2019-02-28 DIAGNOSIS — L8995 Pressure ulcer of unspecified site, unstageable: Secondary | ICD-10-CM | POA: Diagnosis not present

## 2019-02-28 DIAGNOSIS — M4804 Spinal stenosis, thoracic region: Secondary | ICD-10-CM | POA: Diagnosis not present

## 2019-02-28 DIAGNOSIS — M4800 Spinal stenosis, site unspecified: Secondary | ICD-10-CM | POA: Diagnosis not present

## 2019-02-28 DIAGNOSIS — G992 Myelopathy in diseases classified elsewhere: Secondary | ICD-10-CM | POA: Diagnosis not present

## 2019-03-02 ENCOUNTER — Other Ambulatory Visit: Payer: Self-pay | Admitting: Internal Medicine

## 2019-03-08 DIAGNOSIS — Z01812 Encounter for preprocedural laboratory examination: Secondary | ICD-10-CM | POA: Diagnosis not present

## 2019-03-09 ENCOUNTER — Telehealth: Payer: Self-pay | Admitting: Internal Medicine

## 2019-03-09 DIAGNOSIS — E875 Hyperkalemia: Secondary | ICD-10-CM

## 2019-03-09 LAB — BASIC METABOLIC PANEL
BUN/Creatinine Ratio: 18 (ref 12–28)
BUN: 21 mg/dL (ref 8–27)
CO2: 25 mmol/L (ref 20–29)
Calcium: 9.6 mg/dL (ref 8.7–10.3)
Chloride: 104 mmol/L (ref 96–106)
Creatinine, Ser: 1.17 mg/dL — ABNORMAL HIGH (ref 0.57–1.00)
GFR calc Af Amer: 55 mL/min/{1.73_m2} — ABNORMAL LOW (ref >59)
GFR calc non Af Amer: 47 mL/min/{1.73_m2} — ABNORMAL LOW (ref >59)
Glucose: 238 mg/dL — ABNORMAL HIGH (ref 65–99)
Potassium: 6.1 mmol/L (ref 3.5–5.2)
Sodium: 141 mmol/L (ref 134–144)

## 2019-03-09 NOTE — Telephone Encounter (Signed)
Notified early this AM of K 6.1 from labs drawn yesterday ~ 11:00 AM. Will arrange to have patient come in for repeat labs early this morning.  Dr. Rennis Golden notified.   Myca Perno K. Charna Busman, MD

## 2019-03-09 NOTE — Telephone Encounter (Signed)
Noted patient to have K of 6.1 - please repeat lab today, will need to treat if elevated. Not on supplemental K.   Dr Rexene Edison

## 2019-03-10 NOTE — Addendum Note (Signed)
Addended by: Lindell Spar on: 03/10/2019 09:22 AM   Modules accepted: Orders

## 2019-03-10 NOTE — Telephone Encounter (Signed)
Spoke with patient about K+ results. She was not already notified of results. She was advised to have BMET today but she reports she cannot do this, as she is in a wheelchair and has to arrange transportation. Advised that this lab needs to be followed up on ASAP, ideally before end of this week. STAT BMET has been ordered and she is aware she can come to Dr. Blanchie Dessert office for lab or any other [facility] test site.

## 2019-03-11 ENCOUNTER — Ambulatory Visit (HOSPITAL_COMMUNITY): Payer: Medicare Other

## 2019-03-12 ENCOUNTER — Telehealth: Payer: Self-pay | Admitting: Internal Medicine

## 2019-03-12 DIAGNOSIS — E875 Hyperkalemia: Secondary | ICD-10-CM | POA: Diagnosis not present

## 2019-03-12 LAB — BASIC METABOLIC PANEL
BUN/Creatinine Ratio: 19 (ref 12–28)
BUN: 21 mg/dL (ref 8–27)
CO2: 24 mmol/L (ref 20–29)
Calcium: 9.8 mg/dL (ref 8.7–10.3)
Chloride: 104 mmol/L (ref 96–106)
Creatinine, Ser: 1.1 mg/dL — ABNORMAL HIGH (ref 0.57–1.00)
GFR calc Af Amer: 59 mL/min/{1.73_m2} — ABNORMAL LOW (ref >59)
GFR calc non Af Amer: 51 mL/min/{1.73_m2} — ABNORMAL LOW (ref >59)
Glucose: 255 mg/dL — ABNORMAL HIGH (ref 65–99)
Potassium: 6.1 mmol/L (ref 3.5–5.2)
Sodium: 139 mmol/L (ref 134–144)

## 2019-03-12 MED ORDER — LOKELMA 10 G PO PACK: 10.0000 g | PACK | Freq: Every day | ORAL | 0 refills | Status: DC

## 2019-03-12 NOTE — Telephone Encounter (Signed)
-----   Message from Chrystie Nose, MD sent at 03/12/2019  4:54 PM EST ----- Potassium remains high -advise to stop lisinopril. Please call in LoKelma- 10 mg packet x 1, one refill - repeat BMET on Monday. Low potassium foods.  Dr. Rexene Edison

## 2019-03-12 NOTE — Telephone Encounter (Signed)
Patient called w/results. She voiced understanding. Educated her on diet - she drinks OJ daily. Advised of med changes. Rx sent to pharmacy and BMET ordered

## 2019-03-13 ENCOUNTER — Other Ambulatory Visit: Payer: Self-pay | Admitting: Family Medicine

## 2019-03-13 ENCOUNTER — Other Ambulatory Visit: Payer: Self-pay | Admitting: Physical Medicine & Rehabilitation

## 2019-03-13 DIAGNOSIS — I10 Essential (primary) hypertension: Secondary | ICD-10-CM

## 2019-03-15 NOTE — Telephone Encounter (Signed)
Recieved electronic medication refill request for gabapentin medication.  No mention of medication in notes since MAY 2020, unsure if ok to refill this medication given length of time.  Please advise.

## 2019-03-16 ENCOUNTER — Other Ambulatory Visit: Payer: Self-pay | Admitting: Family Medicine

## 2019-03-16 DIAGNOSIS — I10 Essential (primary) hypertension: Secondary | ICD-10-CM

## 2019-03-18 ENCOUNTER — Other Ambulatory Visit: Payer: Self-pay

## 2019-03-18 MED ORDER — GABAPENTIN 300 MG PO CAPS: ORAL_CAPSULE | ORAL | 0 refills | Status: DC

## 2019-03-18 NOTE — Telephone Encounter (Signed)
Placed a call to Cassandra Stewart, she is taking her Gabapentin three times a day, Gabapentin refilled. She verbalizes understanding.

## 2019-03-18 NOTE — Telephone Encounter (Signed)
Recieved faxed request to refill gabapentin medication for this patient. No mention of this medicine in any of the previous years notes.  Unsure if ok to refill,  Please advise.

## 2019-03-24 DIAGNOSIS — E875 Hyperkalemia: Secondary | ICD-10-CM | POA: Diagnosis not present

## 2019-03-24 LAB — BASIC METABOLIC PANEL
BUN/Creatinine Ratio: 16 (ref 12–28)
BUN: 18 mg/dL (ref 8–27)
CO2: 23 mmol/L (ref 20–29)
Calcium: 9.6 mg/dL (ref 8.7–10.3)
Chloride: 103 mmol/L (ref 96–106)
Creatinine, Ser: 1.11 mg/dL — ABNORMAL HIGH (ref 0.57–1.00)
GFR calc Af Amer: 58 mL/min/{1.73_m2} — ABNORMAL LOW (ref >59)
GFR calc non Af Amer: 50 mL/min/{1.73_m2} — ABNORMAL LOW (ref >59)
Glucose: 252 mg/dL — ABNORMAL HIGH (ref 65–99)
Potassium: 5 mmol/L (ref 3.5–5.2)
Sodium: 141 mmol/L (ref 134–144)

## 2019-03-29 DIAGNOSIS — L8995 Pressure ulcer of unspecified site, unstageable: Secondary | ICD-10-CM | POA: Diagnosis not present

## 2019-03-29 DIAGNOSIS — M4804 Spinal stenosis, thoracic region: Secondary | ICD-10-CM | POA: Diagnosis not present

## 2019-03-29 DIAGNOSIS — G992 Myelopathy in diseases classified elsewhere: Secondary | ICD-10-CM | POA: Diagnosis not present

## 2019-03-29 DIAGNOSIS — G8220 Paraplegia, unspecified: Secondary | ICD-10-CM | POA: Diagnosis not present

## 2019-03-30 ENCOUNTER — Other Ambulatory Visit: Payer: Self-pay

## 2019-03-30 ENCOUNTER — Encounter: Payer: Self-pay | Admitting: Internal Medicine

## 2019-03-30 ENCOUNTER — Ambulatory Visit (INDEPENDENT_AMBULATORY_CARE_PROVIDER_SITE_OTHER): Payer: Medicare Other | Admitting: Internal Medicine

## 2019-03-30 VITALS — BP 128/70 | HR 71

## 2019-03-30 DIAGNOSIS — E538 Deficiency of other specified B group vitamins: Secondary | ICD-10-CM

## 2019-03-30 DIAGNOSIS — E1140 Type 2 diabetes mellitus with diabetic neuropathy, unspecified: Secondary | ICD-10-CM | POA: Diagnosis not present

## 2019-03-30 DIAGNOSIS — E782 Mixed hyperlipidemia: Secondary | ICD-10-CM | POA: Diagnosis not present

## 2019-03-30 DIAGNOSIS — IMO0002 Reserved for concepts with insufficient information to code with codable children: Secondary | ICD-10-CM

## 2019-03-30 DIAGNOSIS — E1165 Type 2 diabetes mellitus with hyperglycemia: Secondary | ICD-10-CM | POA: Diagnosis not present

## 2019-03-30 LAB — VITAMIN B12: Vitamin B-12: >1500 pg/mL — ABNORMAL HIGH (ref 211–911)

## 2019-03-30 LAB — POCT GLYCOSYLATED HEMOGLOBIN (HGB A1C): Hemoglobin A1C: 9.6 % — AB (ref 4.0–5.6)

## 2019-03-30 MED ORDER — ACCU-CHEK AVIVA PLUS W/DEVICE KIT: PACK | 1 refills | Status: DC

## 2019-03-30 MED ORDER — ACCU-CHEK AVIVA PLUS VI STRP: ORAL_STRIP | 12 refills | Status: DC

## 2019-03-30 MED ORDER — TRULICITY 3 MG/0.5ML ~~LOC~~ SOAJ: 3.0000 mg | SUBCUTANEOUS | 11 refills | Status: DC

## 2019-03-30 NOTE — Patient Instructions (Addendum)
Please continue: - Metformin ER 500mg  1-2x a day with meals. - Toujeo 35 units at night - Humalog 7-10 units before the meals  If you are skipping a meal, check sugars are if >150, may need to take 4-5 units.  Please increase: - Trulicity  To 3 mg weekly  Please continue B12 2500 mcg daily.  Please return in 3-4 months with your sugar log.

## 2019-03-30 NOTE — Progress Notes (Signed)
Patient ID: Cassandra Stewart, female   DOB: 30-Jan-1951, 69 y.o.   MRN: [id number]   This visit occurred during the SARS-CoV-2 public health emergency.  Safety protocols were in place, including screening questions prior to the visit, additional usage of staff PPE, and extensive cleaning of exam room while observing appropriate contact time as indicated for disinfecting solutions.   HPI: Cassandra Stewart is a 69 y.o.-year-old female, returning for follow-up for DM2, dx in 1992, insulin-dependent since 2006, uncontrolled, with complications (CKD stage 3, PN, DR).  Last visit 3 months ago.  Reviewed HbA1c Lab Results  Component Value Date   HGBA1C 9.8 (A) 12/28/2018   HGBA1C 10.7 (A) 07/13/2018   HGBA1C 6.8 (A) 03/16/2018   HGBA1C 6.1 (A) 11/12/2017   HGBA1C 9.0 07/11/2017   HGBA1C 9.0 04/14/2017   HGBA1C 8.7% 10/24/2016   HGBA1C 9.6% 07/25/2016   HGBA1C 10.5% 03/04/2016   HGBA1C 8.1% 10/25/2015   HGBA1C 9.7 (H) 07/04/2015  03/16/2018: HbA1c calculated from fructosamine 6.28%  Pt was on a regimen of: - Metformin 500 mg 1x a day with dinner.  She had diarrhea with a higher dose. - Toujeo 45 units in am - Humalog 18 units 2-3x a day, before meals Tried: Actos, Lantus.  Now on: - Metformin ER 500 mg 1-2x a day with meals. - Trulicity 1.5 mg weekly - Toujeo 35 units at night - Humalog 7-10 units before the meals  She could no check sugars since last OV but could not get the meter to work. From before: - am: 140-183 >> 63, 74-91 >> 90-103 >> 90-100  - 2h after b'fast: n/c - before lunch: n/c >> 80-110 >> n/c >> 60s, 70 - 2h after lunch: n/c - before dinner: n/c >>  80-110 >> 90-100 >> 188 - 2h after dinner: n/c - bedtime: n/c - nighttime: n/c Lowest sugar was 60s >> ? ; she has hypoglycemia awareness in the 80s. Highest sugar was 188 >> ?Marland Kitchen  Glucometer: Freestyle  Pt's meals are: - Breakfast/brunch: egg, bacon, toast >> no b'fast now - Lunch: snack mostly (no Humalog) >>  Meals on Wheels - Dinner: chicken/fish + vegetables - Snacks: no  -+ Stage III CKD; last BUN/creatinine:  Lab Results  Component Value Date   BUN 18 03/24/2019   BUN 21 03/12/2019   CREATININE 1.11 (H) 03/24/2019   CREATININE 1.10 (H) 03/12/2019   Lab Results  Component Value Date   GFRAA 58 (L) 03/24/2019   GFRAA 59 (L) 03/12/2019   GFRAA 55 (L) 03/08/2019   GFRAA 58 (L) 11/30/2018   GFRAA 58 (L) 11/23/2017  Stopped lisinopril 10 because of Hyperkalemia.  -+ HL; last set of lipids: Lab Results  Component Value Date   CHOL 173 12/28/2018   HDL 61.20 12/28/2018   LDLCALC 82 12/28/2018   TRIG 153.0 (H) 12/28/2018   CHOLHDL 3 12/28/2018  On lovastatin 40. - last eye exam was in 11/2017: + DR.  She has history of cataract surgery OU. -She has numbness and tingling in her feet.  On Neurontin. On ASA 81.  Low vitamin B12:  Reviewed her B12 levels: Lab Results  Component Value Date   VITAMINB12 >1500 (H) 12/28/2018   VITAMINB12 261 03/16/2018   VITAMINB12 300 04/14/2017   VITAMINB12 478 10/25/2015  05/01/2016: Vit B12 248.  She continues on B12 p.o. at last visit I advised her to take 5000 units every other day, decreased from every day after her level returned high.  She continues to  be in a wheelchair as her left leg is very weak.  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  She had surgery in 2016 but strength did not improve.  She has an extensive FH of heart disease - women in her family. Daughter died after her heart stopped.  ROS: Constitutional: no weight gain/no weight loss, no fatigue, no subjective hyperthermia, no subjective hypothermia Eyes: no blurry vision, no xerophthalmia ENT: no sore throat, no nodules palpated in neck, no dysphagia, no odynophagia, no hoarseness Cardiovascular: no CP/no SOB/no palpitations/no leg swelling Respiratory: no cough/no SOB/no wheezing Gastrointestinal: no N/no V/no D/no C/no acid reflux Musculoskeletal:  no muscle aches/no joint aches Skin: no rashes, no hair loss Neurological: no tremors/no numbness/no tingling/no dizziness  I reviewed pt's medications, allergies, PMH, social hx, family hx, and changes were documented in the history of present illness. Otherwise, unchanged from my initial visit note.  Past Medical History:  Diagnosis Date  . Elevated LFTs   . Fatty liver    on [place].   Marland Kitchen Hyperlipemia   . Hypertension   . Uncontrolled type 2 diabetes with neuropathy [facility])    Past Surgical History:  Procedure Laterality Date  . APPENDECTOMY  1969  . BREAST BIOPSY Right   . CATARACT EXTRACTION     May 21016  . LUMBAR DISC SURGERY    . LUMBAR LAMINECTOMY/DECOMPRESSION MICRODISCECTOMY N/A 07/06/2015   Procedure: Thoracic ten- eleven laminectomy for spinal canal decompression;  Surgeon: Ashok Pall, MD;  Location: [place] [facility];  Service: Orthopedics;  Laterality: N/A;   Social History   Social History  . Marital status: Divorced    Spouse name: N/A  . Number of children: 0   Occupational History  . None   Social History Main Topics  . Smoking status: Never Smoker  . Smokeless tobacco: Never Used  . Alcohol use No  . Drug use: No   Current Outpatient Medications on File Prior to Visit  Medication Sig Dispense Refill  . ACCU-CHEK SOFTCLIX LANCETS lancets Test 3 times daily. Pt uses accu-chek softclix lancing device 200 each 3  . acetaminophen (ACETAMINOPHEN 8 HOUR) 650 MG CR tablet Take 650 mg by mouth every 8 (eight) hours as needed for pain.    Marland Kitchen albuterol (PROVENTIL HFA) 108 (90 Base) MCG/ACT inhaler Inhale 1-2 puffs into the lungs every 4 (four) hours as needed for wheezing or shortness of breath.     Marland Kitchen amLODipine (NORVASC) 10 MG tablet TAKE 1 TABLET(10 MG) BY MOUTH EVERY MORNING 90 tablet 0  . aspirin EC 81 MG tablet Take 81 mg by mouth every evening.    . Blood Glucose Monitoring Suppl (ACCU-CHEK AVIVA PLUS) w/Device KIT Use as advised 1 kit 1  . Cholecalciferol (VITAMIN  D PO) Take 1,000 mg by mouth daily.     . Cyanocobalamin (VITAMIN B 12 PO) Take 5,000 mg by mouth daily.    Marland Kitchen dicyclomine (BENTYL) 10 MG capsule Take 1 capsule (10 mg total) by mouth every 8 (eight) hours as needed for spasms. 90 capsule 1  . gabapentin (NEURONTIN) 300 MG capsule TAKE 1 CAPSULE(300 MG) BY MOUTH THREE TIMES DAILY 270 capsule 0  . glucose blood (CONTOUR NEXT TEST) test strip Use to check blood sugar 4 times a day. 400 each 12  . hydrochlorothiazide (MICROZIDE) 12.5 MG capsule TAKE 1 CAPSULE(12.5 MG) BY MOUTH DAILY 90 capsule 1  . insulin lispro (HUMALOG KWIKPEN) 100 UNIT/ML KwikPen Inject 0.07-0.1 mLs (7-10 Units total) into the  skin 3 (three) times daily with meals. 30 mL 3  . Insulin Pen Needle (CAREFINE PEN NEEDLES) 32G X 4 MM MISC Use 4x a day 300 each 3  . lisinopril (ZESTRIL) 10 MG tablet TAKE 1 TABLET(10 MG) BY MOUTH EVERY MORNING 90 tablet 0  . lovastatin (MEVACOR) 40 MG tablet Take 1 tablet (40 mg total) by mouth at bedtime. 90 tablet 1  . metFORMIN (GLUCOPHAGE-XR) 500 MG 24 hr tablet Take 1 tablet (500 mg total) by mouth 2 (two) times daily. 180 tablet 3  . metoprolol tartrate (LOPRESSOR) 100 MG tablet Take ONE tablet TWO HOURS prior to test. 1 tablet 0  . nitroGLYCERIN (NITROSTAT) 0.4 MG SL tablet Place 1 tablet (0.4 mg total) under the tongue every 5 (five) minutes as needed for chest pain. 25 tablet 3  . polyethylene glycol (MIRALAX / GLYCOLAX) 17 g packet Take 17 g by mouth as needed.    . sodium zirconium cyclosilicate (LOKELMA) 10 g PACK packet Take 10 g by mouth daily. 1 packet 0  . TOUJEO SOLOSTAR 300 UNIT/ML SOPN Inject 35 Units into the skin at bedtime. 12 pen 3  . TRULICITY 1.5 HT/9.7FS SOPN INJECT 1.5 MG UNDER THE SKIN ONCE A WEEK 12 mL 0   No current facility-administered medications on file prior to visit.   Allergies  Allergen Reactions  . Other     PT PREFERS TO NOT HAVE ANY NARCOTIC MEDICATIONS  . Chlorhexidine Rash    Burning after using CHG  wipes-used for surgery  . Oxycodone Hcl     Other reaction(s): Hallucination Marked hallucinations and palpitations following dose of 18m on 04/30/2015    Family History  Problem Relation Age of Onset  . Diabetes Mother   . Heart disease Mother   . Kidney disease Brother        Two brothers on ESRD  . Cancer Sister   . Amblyopia Neg Hx   . Blindness Neg Hx   . Cataracts Neg Hx   . Glaucoma Neg Hx   . Macular degeneration Neg Hx   . Retinal detachment Neg Hx   . Strabismus Neg Hx   . Retinitis pigmentosa Neg Hx   . Colon cancer Neg Hx   . Esophageal cancer Neg Hx    Pt has FH of DM in M, MGM, PGM, M aunt, uncles.  PE: There were no vitals taken for this visit. Wt Readings from Last 3 Encounters:  01/13/19 226 lb (102.5 kg)  12/28/18 210 lb (95.3 kg)  12/03/18 210 lb (95.3 kg)   Constitutional: overweight, in NAD, in wheelchair Eyes: PERRLA, EOMI, no exophthalmos ENT: moist mucous membranes, no thyromegaly, no cervical lymphadenopathy Cardiovascular: RRR, No MRG Respiratory: CTA B Gastrointestinal: abdomen soft, NT, ND, BS+ Musculoskeletal: no deformities, strength intact in all 4 Skin: moist, warm, no rashes Neurological: no tremor with outstretched hands, DTR normal in all 4  ASSESSMENT: 1. DM2, insulin-dependent, uncontrolled, with complications - CKD stage 3 - PN - DR  - Of note, we tried to prescribe the Freestyle libre CGM for her but this was not covered by the insurance  2. Low B12  3. HL  PLAN:  1. Patient with longstanding, type 2 diabetes, on oral antidiabetic regimen with Metformin low-dose, weekly GLP-1 receptor agonist and basal-bolus.  At last visit, we increased the doses of her Toujeo and Humalog as her HbA1c was still high.  At that time, she was not checking sugars as her meter was broken.  I  did suggest to increase the dose of Trulicity at that time but she had just received a batch of 1.5 mg doses and she wanted to use these first.  I also  advised her to start checking sugars 4 times a day and we gave her a Contour next meter. -At this visit, she is still not checking sugars as she could not get the Contour meter to work.  We sent a new meter to her pharmacy and I advised her that we absolutely need to know about her sugars to be able to control her diabetes. - we checked her HbA1c: 9.6% (slightly lower) - Based on the HbA1c, sugars are still high so at this visit I advised him to try to increase Trulicity to 3 mg weekly. - she is telling me that she is not eating during the day as she is not hungry but I suspect that her sugars stay elevated.  I advised her to check sugars ideally every 4 hours but at least at the time of breakfast and lunch and take insulin for correction, only 4 to 5 units, if not eating. - I suggested to:  Patient Instructions  Please continue: - Metformin ER 500 mg 1-2x a day with meals. - Toujeo 35 units at night - Humalog 7-10 units before the meals  If you are skipping a meal, check sugars are if >150, may need to take 4-5 units.  Please increase: - Trulicity  To 3 mg weekly  Please continue B12 2500 mcg daily.  Please return in 3-4 months with your sugar log.   - advised to check sugars at different times of the day - 4x a day, rotating check times - advised for yearly eye exams >> she is UTD - return to clinic in 3-4 months  2. Low B12 -She has a history of low vitamin B12 and has been on supplementation with p.o. vitamin B12. -At last check, the level was high -She refused injections so we continued p.o. vitamin B12 daily but I advised her to take this every other day  3. HL -Reviewed latest lipid panel from last visit: LDL slightly higher than goal of less than 70, triglycerides also slightly high Lab Results  Component Value Date   CHOL 173 12/28/2018   HDL 61.20 12/28/2018   LDLCALC 82 12/28/2018   TRIG 153.0 (H) 12/28/2018   CHOLHDL 3 12/28/2018  -Continues Mevacor without side  effects  Component     Latest Ref Rng & Units 03/30/2019  Vitamin B12     211 - 911 pg/mL >1500 (H)  Vitamin B12 is still too high so I would advise her to decrease the dose to 1000 mcg daily.  Philemon Kingdom, MD PhD [facility]

## 2019-03-31 DIAGNOSIS — M4804 Spinal stenosis, thoracic region: Secondary | ICD-10-CM | POA: Diagnosis not present

## 2019-03-31 DIAGNOSIS — M4800 Spinal stenosis, site unspecified: Secondary | ICD-10-CM | POA: Diagnosis not present

## 2019-03-31 DIAGNOSIS — G8220 Paraplegia, unspecified: Secondary | ICD-10-CM | POA: Diagnosis not present

## 2019-03-31 DIAGNOSIS — G992 Myelopathy in diseases classified elsewhere: Secondary | ICD-10-CM | POA: Diagnosis not present

## 2019-03-31 DIAGNOSIS — L8995 Pressure ulcer of unspecified site, unstageable: Secondary | ICD-10-CM | POA: Diagnosis not present

## 2019-04-01 ENCOUNTER — Other Ambulatory Visit: Payer: Self-pay

## 2019-04-01 ENCOUNTER — Telehealth: Payer: Self-pay

## 2019-04-01 MED ORDER — TRULICITY 3 MG/0.5ML ~~LOC~~ SOAJ: 3.0000 mg | SUBCUTANEOUS | 11 refills | Status: DC

## 2019-04-01 NOTE — Telephone Encounter (Signed)
Patient called today and stated she has requested her medications be sent as 90 day supply since she has a service get them for her-I changed the Trulicity to 90 day Rx and sent to her preferred [facility]-in the future she wants all of the meds from Dr. Elvera Lennox to be sent as a 90 day supply-fyi

## 2019-04-01 NOTE — Telephone Encounter (Signed)
Noted! Thank you

## 2019-04-01 NOTE — Telephone Encounter (Signed)
Notified patient of message from Dr. Gherghe, patient expressed understanding and agreement. No further questions.  

## 2019-04-01 NOTE — Telephone Encounter (Signed)
-----   Message from Carlus Pavlov, MD sent at 03/31/2019  1:04 PM EST ----- Efraim Kaufmann, can you please call pt:  Vitamin B12 is still too high so I would advise her to decrease the dose to 1000 mcg daily.

## 2019-04-16 ENCOUNTER — Encounter: Payer: Medicare Other | Attending: Registered Nurse | Admitting: Physical Medicine & Rehabilitation

## 2019-04-16 ENCOUNTER — Other Ambulatory Visit: Payer: Self-pay

## 2019-04-16 ENCOUNTER — Encounter: Payer: Self-pay | Admitting: Physical Medicine & Rehabilitation

## 2019-04-16 VITALS — BP 129/81 | HR 74 | Temp 97.8°F | Ht 64.5 in | Wt 220.0 lb

## 2019-04-16 DIAGNOSIS — G8220 Paraplegia, unspecified: Secondary | ICD-10-CM

## 2019-04-16 DIAGNOSIS — M4714 Other spondylosis with myelopathy, thoracic region: Secondary | ICD-10-CM | POA: Insufficient documentation

## 2019-04-16 NOTE — Progress Notes (Signed)
Subjective:    Patient ID: Cassandra Stewart, female    DOB: November 11, 1950, 69 y.o.   MRN: [id number]  HPI   Hx Thoracic myelopathy with paraplegia Hx diabetes no numbness tingling in the feet  Has problems with tingling in the hands  Using Manual chair inside the home She also has problems dropping objects.  Denies any neck pain.  Spasms at night or early am, mainly in left LE, extensors, knee shakes and toes point Worsening over time.  Occurs 1-2 days per week  At this point the patient does not think it severe enough to warrant any medication management. Pain Inventory Average Pain 3 Pain Right Now 3 My pain is tingling  In the last 24 hours, has pain interfered with the following? General activity 3 Relation with others 3 Enjoyment of life 3 What TIME of day is your pain at its worst? evening Sleep (in general) Fair  Pain is worse with: standing and some activites Pain improves with: rest, pacing activities and medication Relief from Meds: 5  Mobility ability to climb steps?  no do you drive?  no  Function disabled: date disabled .  Neuro/Psych numbness  Prior Studies Any changes since last visit?  no  Physicians involved in your care Any changes since last visit?  no   Family History  Problem Relation Age of Onset  . Diabetes Mother   . Heart disease Mother   . Kidney disease Brother        Two brothers on ESRD  . Cancer Sister   . Amblyopia Neg Hx   . Blindness Neg Hx   . Cataracts Neg Hx   . Glaucoma Neg Hx   . Macular degeneration Neg Hx   . Retinal detachment Neg Hx   . Strabismus Neg Hx   . Retinitis pigmentosa Neg Hx   . Colon cancer Neg Hx   . Esophageal cancer Neg Hx    Social History   Socioeconomic History  . Marital status: Divorced    Spouse name: Not on file  . Number of children: Not on file  . Years of education: Not on file  . Highest education level: Not on file  Occupational History  . Not on file  Tobacco Use  .  Smoking status: Never Smoker  . Smokeless tobacco: Never Used  Substance and Sexual Activity  . Alcohol use: No  . Drug use: No  . Sexual activity: Not Currently  Other Topics Concern  . Not on file  Social History Narrative  . Not on file   Social Determinants of Health   Financial Resource Strain:   . Difficulty of Paying Living Expenses: Not on file  Food Insecurity:   . Worried About Programme researcher, broadcasting/film/video in the Last Year: Not on file  . Ran Out of Food in the Last Year: Not on file  Transportation Needs:   . Lack of Transportation (Medical): Not on file  . Lack of Transportation (Non-Medical): Not on file  Physical Activity:   . Days of Exercise per Week: Not on file  . Minutes of Exercise per Session: Not on file  Stress:   . Feeling of Stress : Not on file  Social Connections:   . Frequency of Communication with Friends and Family: Not on file  . Frequency of Social Gatherings with Friends and Family: Not on file  . Attends Religious Services: Not on file  . Active Member of Clubs or Organizations: Not on file  .  Attends Banker Meetings: Not on file  . Marital Status: Not on file   Past Surgical History:  Procedure Laterality Date  . APPENDECTOMY  1969  . BREAST BIOPSY Right   . CATARACT EXTRACTION     May 68372  . LUMBAR DISC SURGERY    . LUMBAR LAMINECTOMY/DECOMPRESSION MICRODISCECTOMY N/A 07/06/2015   Procedure: Thoracic ten- eleven laminectomy for spinal canal decompression;  Surgeon: Coletta Memos, MD;  Location: [facility];  Service: Orthopedics;  Laterality: N/A;   Past Medical History:  Diagnosis Date  . Elevated LFTs   . Fatty liver    on [place].   Marland Kitchen Hyperlipemia   . Hypertension   . Uncontrolled type 2 diabetes with neuropathy (HCC)    There were no vitals taken for this visit.  Opioid Risk Score:   Fall Risk Score:  `1  Depression screen PHQ 2/9  Depression screen [facility] 2/9 08/25/2018 11/05/2017 10/21/2017 09/09/2017 08/05/2016 08/01/2016  07/29/2016  Decreased Interest 0 0 0 0 0 0 0  Down, Depressed, Hopeless 0 3 0 0 0 0 0  PHQ - 2 Score 0 3 0 0 0 0 0     Review of Systems  Constitutional: Positive for appetite change.  HENT: Negative.   Eyes: Negative.   Respiratory: Negative.   Cardiovascular: Positive for leg swelling.  Gastrointestinal: Negative.   Endocrine: Negative.   Genitourinary: Negative.   Musculoskeletal: Positive for arthralgias and myalgias.  Skin: Negative.   Allergic/Immunologic: Negative.   Neurological: Positive for numbness.  Hematological: Negative.   Psychiatric/Behavioral: Negative.   All other systems reviewed and are negative.      Objective:   Physical Exam Vitals and nursing note reviewed.  Constitutional:      Appearance: She is obese.  HENT:     Head: Normocephalic and atraumatic.  Eyes:     Extraocular Movements: Extraocular movements intact.     Conjunctiva/sclera: Conjunctivae normal.     Pupils: Pupils are equal, round, and reactive to light.  Musculoskeletal:     Right wrist: No swelling, deformity, effusion or tenderness. Normal range of motion.     Left wrist: No swelling, deformity, effusion or tenderness. Normal range of motion.     Right hand: No swelling, deformity or tenderness. Decreased sensation of the median distribution.     Left hand: No swelling, deformity or tenderness. Decreased sensation of the median distribution.     Cervical back: Normal range of motion.     Comments: Sensory issues are more of a hypersensitivity to pinprick in the second and third digits.  Neurological:     Mental Status: She is alert and oriented to person, place, and time.  Psychiatric:        Mood and Affect: Mood normal.        Behavior: Behavior normal.        Thought Content: Thought content normal.        Judgment: Judgment normal.   Motor strength is 5/5 bilateral deltoid by stress of grip  4/5 right hip flexion knee extension ankle dorsiflexion 4 - left hip flexion knee  extension ankle dorsiflexion Sensation is intact to light touch bilateral feet. Upper extremity there is no decreased sensation to pinprick she does have some hyperalgesia to pinprick in the second and third digits bilaterally Bilateral positive Tinel's sign at the wrist.        Assessment & Plan:  #1.  Thoracic spondylosis without myelopathy.  She does very little walking.  Given the antigravity plus strength in her lower extremities I think she is capable of doing this a little bit more.  She does admit that she could be doing a little bit more than she is right now. She is using a manual wheelchair in the home and using a power chair outside the home. She does have some issues with spasticity in the left lower extremity but this is only a couple mornings a week.  At this point will hold off on using baclofen.  Has not been on this medication in the past. 2.  Bilateral finger sensory changes as well as dropping objects, positive Tinel's sign suspect carpal tunnel syndrome will prescribe wrist splints at night, she already has these.  In 1 month will do ultrasound of the wrists to compare median nerve cross-sectional diameter at the wrist versus distal forearm.  She did have an EMG/NCV in 2007 when living in [place] which was very painful and she does not wish to undergo repeat testing.  She states she was told she did have carpal tunnel syndrome.  She states that she has had marked elevation of her blood sugars with corticosteroid injection so that this may not be a good option for her.  We also discussed we have a spinal cord injury specialist physician that is new to the practice.  After her ultrasound of the wrists will make referral to Dr. Dagoberto Ligas

## 2019-04-16 NOTE — Patient Instructions (Addendum)
Wear wrist splint nightly-

## 2019-04-21 ENCOUNTER — Encounter: Payer: Self-pay | Admitting: Internal Medicine

## 2019-04-21 DIAGNOSIS — Z01812 Encounter for preprocedural laboratory examination: Secondary | ICD-10-CM

## 2019-04-21 NOTE — Telephone Encounter (Signed)
Opened in error

## 2019-04-26 ENCOUNTER — Ambulatory Visit (HOSPITAL_COMMUNITY): Payer: Medicare Other

## 2019-04-28 ENCOUNTER — Other Ambulatory Visit: Payer: Self-pay

## 2019-04-28 ENCOUNTER — Ambulatory Visit: Payer: Medicare Other | Admitting: Internal Medicine

## 2019-04-28 DIAGNOSIS — M4804 Spinal stenosis, thoracic region: Secondary | ICD-10-CM | POA: Diagnosis not present

## 2019-04-28 DIAGNOSIS — G992 Myelopathy in diseases classified elsewhere: Secondary | ICD-10-CM | POA: Diagnosis not present

## 2019-04-28 DIAGNOSIS — G8220 Paraplegia, unspecified: Secondary | ICD-10-CM | POA: Diagnosis not present

## 2019-04-28 DIAGNOSIS — M4800 Spinal stenosis, site unspecified: Secondary | ICD-10-CM | POA: Diagnosis not present

## 2019-04-28 DIAGNOSIS — L8995 Pressure ulcer of unspecified site, unstageable: Secondary | ICD-10-CM | POA: Diagnosis not present

## 2019-05-03 ENCOUNTER — Encounter: Payer: Self-pay | Admitting: Internal Medicine

## 2019-05-03 ENCOUNTER — Other Ambulatory Visit: Payer: Self-pay

## 2019-05-03 ENCOUNTER — Ambulatory Visit (INDEPENDENT_AMBULATORY_CARE_PROVIDER_SITE_OTHER): Payer: Medicare Other | Admitting: Internal Medicine

## 2019-05-03 VITALS — BP 148/60 | HR 81 | Temp 94.6°F | Ht 64.5 in | Wt 226.0 lb

## 2019-05-03 DIAGNOSIS — G4733 Obstructive sleep apnea (adult) (pediatric): Secondary | ICD-10-CM | POA: Diagnosis not present

## 2019-05-03 DIAGNOSIS — R072 Precordial pain: Secondary | ICD-10-CM | POA: Diagnosis not present

## 2019-05-03 DIAGNOSIS — I10 Essential (primary) hypertension: Secondary | ICD-10-CM | POA: Diagnosis not present

## 2019-05-03 NOTE — Progress Notes (Signed)
OFFICE CONSULT NOTE  Chief Complaint:  Chest pain follow-up  Primary Care Physician: Girtha Rm, NP-C  HPI:  Cassandra Stewart is a 69 y.o. female who is being seen today for the evaluation of chest pain at the request of Cassandra Stewart, Cassandra L, NP-C. This is a pleasant 69 year old female who was previously living in [place].  She had seen cardiology there and had to stress test back in 2015 and 2017 which was negative for ischemia.  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  She also has hypertension, dyslipidemia, uncontrolled diabetes (now followed by Dr. Letta Median), as well as a family history of coronary disease in her mom, grandmother and women side of the family.  Recently she had been having some chest discomfort.  She had an episode of sharp chest discomfort in the right upper chest at the base of her neck.  She called EMS and was given nitroglycerin which caused immediate improvement in her symptoms.  Due to concern that this was potentially cardiac, she was referred to cardiology for further evaluation.  She also has reported some recent increase in shortness of breath.  She is not able to be active.  05/03/2019  Cassandra Stewart returns today for follow-up of chest pain.  She reports intermittent chest discomfort however not even on a weekly basis.  Its associated with some exertion although she is primarily wheelchair-bound.  She says when she does some mopping her housework, she can have some associated chest discomfort.  Also she notes that sometimes in the morning when she has headaches, which could be related to yet undiagnosed sleep apnea, she reports that she may have chest discomfort following that.  She was referred for sleep study however this has been postponed due to concerns about Covid.  In addition I recommended CT coronary angiography which was also not  performed due to Covid concerns.  Today she had a lot of questions regarding vaccination for coronavirus.  Apparently she had a bad reaction to the flu vaccine when it first came out and is hesitant about pursuing a vaccine.  I explained to her the different mechanism of the Mody or not advise her vaccines and the fact that was not a viral based vaccine.  I think she would tolerate quite well and given her risk factors including diabetes, she is a good candidate for vaccination.  She would like to defer any further testing until she is vaccinated.  PMHx:  Past Medical History:  Diagnosis Date  . Elevated LFTs   . Fatty liver    on [place].   Marland Kitchen Hyperlipemia   . Hypertension   . Uncontrolled type 2 diabetes with neuropathy [facility])     Past Surgical History:  Procedure Laterality Date  . APPENDECTOMY  1969  . BREAST BIOPSY Right   . CATARACT EXTRACTION     May 21016  . LUMBAR DISC SURGERY    . LUMBAR LAMINECTOMY/DECOMPRESSION MICRODISCECTOMY N/A 07/06/2015   Procedure: Thoracic ten- eleven laminectomy for spinal canal decompression;  Surgeon: Ashok Pall, MD;  Location: [place] [facility]UR[facility] OR[facility];  Service: Orthopedics;  Laterality: N/A;    FAMHx:  Family History  Problem Relation Age of Onset  . Diabetes Mother   . Heart disease Mother   . Kidney disease Brother        Two brothers on ESRD  . Cancer Sister   . Amblyopia Neg  Hx   . Blindness Neg Hx   . Cataracts Neg Hx   . Glaucoma Neg Hx   . Macular degeneration Neg Hx   . Retinal detachment Neg Hx   . Strabismus Neg Hx   . Retinitis pigmentosa Neg Hx   . Colon cancer Neg Hx   . Esophageal cancer Neg Hx     SOCHx:   reports that she has never smoked. She has never used smokeless tobacco. She reports that she does not drink alcohol or use drugs.  ALLERGIES:  Allergies  Allergen Reactions  . Other     PT PREFERS TO NOT HAVE ANY NARCOTIC MEDICATIONS  . Chlorhexidine Rash    Burning after using CHG wipes-used for surgery  . Oxycodone Hcl       Other reaction(s): Hallucination Marked hallucinations and palpitations following dose of 65m on 04/30/2015     ROS: Pertinent items noted in HPI and remainder of comprehensive ROS otherwise negative.  HOME MEDS: Current Outpatient Medications on File Prior to Visit  Medication Sig Dispense Refill  . ACCU-CHEK SOFTCLIX LANCETS lancets Test 3 times daily. Pt uses accu-chek softclix lancing device 200 each 3  . acetaminophen (ACETAMINOPHEN 8 HOUR) 650 MG CR tablet Take 650 mg by mouth every 8 (eight) hours as needed for pain.    .Marland Kitchenalbuterol (PROVENTIL HFA) 108 (90 Base) MCG/ACT inhaler Inhale 1-2 puffs into the lungs every 4 (four) hours as needed for wheezing or shortness of breath.     .Marland KitchenamLODipine (NORVASC) 10 MG tablet TAKE 1 TABLET(10 MG) BY MOUTH EVERY MORNING 90 tablet 0  . aspirin EC 81 MG tablet Take 81 mg by mouth every evening.    . Blood Glucose Monitoring Suppl (ACCU-CHEK AVIVA PLUS) w/Device KIT Use as advised 1 kit 1  . Cholecalciferol (VITAMIN D PO) Take 1,000 mg by mouth daily.     . Cyanocobalamin (VITAMIN B 12 PO) Take 5,000 mg by mouth daily.    .Marland Kitchendicyclomine (BENTYL) 10 MG capsule Take 1 capsule (10 mg total) by mouth every 8 (eight) hours as needed for spasms. 90 capsule 1  . Dulaglutide (TRULICITY) 3 MZO/1.0RUSOPN Inject 3 mg into the skin once a week. 12 pen 11  . gabapentin (NEURONTIN) 300 MG capsule TAKE 1 CAPSULE(300 MG) BY MOUTH THREE TIMES DAILY 270 capsule 0  . glucose blood (ACCU-CHEK AVIVA PLUS) test strip Use as instructed to check blood sugar 4 times a day 400 each 12  . hydrochlorothiazide (MICROZIDE) 12.5 MG capsule TAKE 1 CAPSULE(12.5 MG) BY MOUTH DAILY 90 capsule 1  . insulin lispro (HUMALOG KWIKPEN) 100 UNIT/ML KwikPen Inject 0.07-0.1 mLs (7-10 Units total) into the skin 3 (three) times daily with meals. 30 mL 3  . Insulin Pen Needle (CAREFINE PEN NEEDLES) 32G X 4 MM MISC Use 4x a day 300 each 3  . lovastatin (MEVACOR) 40 MG tablet Take 1 tablet (40  mg total) by mouth at bedtime. 90 tablet 1  . metFORMIN (GLUCOPHAGE-XR) 500 MG 24 hr tablet Take 1 tablet (500 mg total) by mouth 2 (two) times daily. 180 tablet 3  . metoprolol tartrate (LOPRESSOR) 100 MG tablet Take ONE tablet TWO HOURS prior to test. 1 tablet 0  . nitroGLYCERIN (NITROSTAT) 0.4 MG SL tablet Place 1 tablet (0.4 mg total) under the tongue every 5 (five) minutes as needed for chest pain. 25 tablet 3  . polyethylene glycol (MIRALAX / GLYCOLAX) 17 g packet Take 17 g by mouth as needed.    .Marland Kitchen  sodium zirconium cyclosilicate (LOKELMA) 10 g PACK packet Take 10 g by mouth daily. 1 packet 0  . TOUJEO SOLOSTAR 300 UNIT/ML SOPN Inject 35 Units into the skin at bedtime. 12 pen 3   No current facility-administered medications on file prior to visit.    LABS/IMAGING: No results found for this or any previous visit (from the past 48 hour(s)). No results found.  LIPID PANEL:    Component Value Date/Time   CHOL 173 12/28/2018 1103   CHOL 147 11/05/2017 1210   TRIG 153.0 (H) 12/28/2018 1103   HDL 61.20 12/28/2018 1103   HDL 59 11/05/2017 1210   CHOLHDL 3 12/28/2018 1103   VLDL 30.6 12/28/2018 1103   LDLCALC 82 12/28/2018 1103   LDLCALC 61 11/05/2017 1210    WEIGHTS: Wt Readings from Last 3 Encounters:  05/03/19 226 lb (102.5 kg)  04/16/19 220 lb (99.8 kg)  01/13/19 226 lb (102.5 kg)    VITALS: BP (!) 148/60 (BP Location: Left Arm, Patient Position: Sitting, Cuff Size: Large)   Pulse 81   Temp (!) 94.6 F (34.8 C)   Ht 5' 4.5" (1.638 m)   Wt 226 lb (102.5 kg)   BMI 38.19 kg/m   EXAM: General appearance: alert, no distress and morbidly obese Neck: no carotid bruit, no JVD and thyroid not enlarged, symmetric, no tenderness/mass/nodules Lungs: diminished breath sounds bilaterally Heart: regular rate and rhythm, S1, S2 normal, no murmur, click, rub or gallop Abdomen: soft, non-tender; bowel sounds normal; no masses,  no organomegaly and obese Extremities: extremities  normal, atraumatic, no cyanosis or edema Pulses: 2+ and symmetric Skin: Skin color, texture, turgor normal. No rashes or lesions Neurologic: Mental status: Alert, oriented, thought content appropriate, bilateral LE weakness, in wheelchair Psych: Pleasant  EKG: Normal sinus rhythm 81-personally reviewed  ASSESSMENT: 1. Chest pain, possible unstable angina 2. Morbid obesity 3. Lower extremity neuropathy (wheelchair-bound) 4. Hypertension 5. Type 2 diabetes-uncontrolled 6. Dyslipidemia 7. Family history of premature coronary disease 8. History of OSA (not on CPAP)  PLAN: 1.   Ms. Hagg still has intermittent chest pain symptoms, the etiology is unclear however it does not seem to be getting worse.  She has nitro to use if needed.  At some point she may consider CT angiography however she wishes to wait until she is vaccinated.  I advise either the [place] modality vaccine for her because of her history of reaction to the flu vaccine which was apparently severe enough to cause her to be hospitalized.  Finally, she should have sleep study as well.  This has been an ongoing discussion however again deferred due to being vaccinated.  She says she will follow-up on that.  We will schedule follow-up in 6 months.  Pixie Casino, MD, [facility], [place] Director of the Advanced Lipid Disorders &  Cardiovascular Risk Reduction Clinic Diplomate of the American Board of Clinical Lipidology Attending Cardiologist  Direct Dial: [phone]  Fax: [phone]  Website:  [email or web address] 05/03/2019, 1:28 PM

## 2019-05-03 NOTE — Patient Instructions (Signed)
Medication Instructions:  Your physician recommends that you continue on your current medications as directed. Please refer to the Current Medication list given to you today.  *If you need a refill on your cardiac medications before your next appointment, please call your pharmacy*   Follow-Up: At [facility], you and your health needs are our priority.  As part of our continuing mission to provide you with exceptional heart care, we have created designated Provider Care Teams.  These Care Teams include your primary Cardiologist (physician) and Advanced Practice Providers (APPs -  Physician Assistants and Nurse Practitioners) who all work together to provide you with the care you need, when you need it.  We recommend signing up for the patient portal called "MyChart".  Sign up information is provided on this After Visit Summary.  MyChart is used to connect with patients for Virtual Visits (Telemedicine).  Patients are able to view lab/test results, encounter notes, upcoming appointments, etc.  Non-urgent messages can be sent to your provider as well.   To learn more about what you can do with MyChart, go to [email or web address].    Your next appointment:   6 month(s)  The format for your next appointment:   In Person  Provider:   You may see Dr. Hilty or one of the following Advanced Practice Providers on your designated Care Team:    Hao Meng, PA-C  Angela Duke, PA-C or   Krista Kroeger, PA-C    Other Instructions   

## 2019-05-14 ENCOUNTER — Encounter: Payer: Medicare Other | Attending: Registered Nurse | Admitting: Physical Medicine & Rehabilitation

## 2019-05-14 ENCOUNTER — Other Ambulatory Visit: Payer: Self-pay

## 2019-05-14 ENCOUNTER — Encounter: Payer: Self-pay | Admitting: Physical Medicine & Rehabilitation

## 2019-05-14 VITALS — BP 133/78 | HR 68 | Temp 97.3°F | Ht 64.0 in | Wt 226.0 lb

## 2019-05-14 DIAGNOSIS — G8220 Paraplegia, unspecified: Secondary | ICD-10-CM | POA: Diagnosis not present

## 2019-05-14 DIAGNOSIS — G5602 Carpal tunnel syndrome, left upper limb: Secondary | ICD-10-CM

## 2019-05-14 DIAGNOSIS — M4714 Other spondylosis with myelopathy, thoracic region: Secondary | ICD-10-CM | POA: Diagnosis not present

## 2019-05-14 DIAGNOSIS — R202 Paresthesia of skin: Secondary | ICD-10-CM | POA: Diagnosis not present

## 2019-05-14 DIAGNOSIS — G5601 Carpal tunnel syndrome, right upper limb: Secondary | ICD-10-CM

## 2019-05-14 NOTE — Patient Instructions (Signed)
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

## 2019-05-14 NOTE — Progress Notes (Signed)
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  She complains of numbness and tingling of the fingers dropping objects.  On examination 1 month ago she had evidence of Tinel's sign bilaterally at the wrist She has tried wearing carpal tunnel splints at night for the last 1 month without improvements. The patient has history of diabetic neuropathy.  She had a prior EMG in 2007 demonstrating some carpal tunnel syndrome but she states the procedure was very painful and did not wish to undergo it once again.   Right wrist distal 0.15cm2     Ratio 1.5 Right wrist prox 0.10cm2  Left wrist distal 0.20 cm2 , Left wrist prox 0.07cm2 Ratio 2.8  Based on article by Mauricia Area al a ratio of 1.4 or greater between the distal median nerve cross-sectional diameter versus the proximal median nerve cross-sectional diameter is indicative of median nerve compression at the wrist  Impression #1.  Abnormal study 2.  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 She has difficulty manage blood sugars and therefore does not wish to try oral steroids We discussed that the injectable steroids is a very small dose and would likely not result in much change in her blood sugars. The patient does not wish to entertain surgical options We will schedule in 2 weeks

## 2019-05-29 DIAGNOSIS — M4800 Spinal stenosis, site unspecified: Secondary | ICD-10-CM | POA: Diagnosis not present

## 2019-05-29 DIAGNOSIS — G8220 Paraplegia, unspecified: Secondary | ICD-10-CM | POA: Diagnosis not present

## 2019-05-29 DIAGNOSIS — G992 Myelopathy in diseases classified elsewhere: Secondary | ICD-10-CM | POA: Diagnosis not present

## 2019-05-29 DIAGNOSIS — L8995 Pressure ulcer of unspecified site, unstageable: Secondary | ICD-10-CM | POA: Diagnosis not present

## 2019-05-29 DIAGNOSIS — M4804 Spinal stenosis, thoracic region: Secondary | ICD-10-CM | POA: Diagnosis not present

## 2019-06-03 ENCOUNTER — Ambulatory Visit: Payer: Medicare Other | Admitting: Physical Medicine & Rehabilitation

## 2019-06-04 ENCOUNTER — Other Ambulatory Visit: Payer: Self-pay | Admitting: Family Medicine

## 2019-06-04 ENCOUNTER — Telehealth: Payer: Self-pay | Admitting: Family Medicine

## 2019-06-04 DIAGNOSIS — E782 Mixed hyperlipidemia: Secondary | ICD-10-CM

## 2019-06-04 DIAGNOSIS — I10 Essential (primary) hypertension: Secondary | ICD-10-CM

## 2019-06-04 NOTE — Telephone Encounter (Signed)
Pt called with multiple concerns. She said the battery isn't staying charged in her power wheelchair and she does not want to use the company she got it from. So she called [employer] and they told her they could come out and access the battery but needs authorization from her pcp to do so. Their number is [phone]. Also she wants to take the Covid vaccine but is unsure on which one she should take. She said the last time she took the flu shot it put her in the hospital and was advised not to take it ever again. So she is unsure what to do at this point.

## 2019-06-04 NOTE — Telephone Encounter (Signed)
I recommend the Covid vaccine for her. This is nothing like the flu shot and she should do fine. I recommend taking whichever of the 3 vaccines she can get. The Laural Benes and Laural Benes is only one shot so that is a benefit.  Please assist her with her chair as best you can.

## 2019-06-04 NOTE — Telephone Encounter (Signed)
Left detailed message for pt about covid vaccine and she should get it  [employer] will not take pt on to access battery as the wheelchair did not come from them. She would have to get wheelchair from them for access or repairs. She would need to contact old company for repairs or service

## 2019-06-07 ENCOUNTER — Other Ambulatory Visit: Payer: Self-pay | Admitting: Registered Nurse

## 2019-06-17 ENCOUNTER — Telehealth: Payer: Self-pay | Admitting: *Deleted

## 2019-06-17 NOTE — Telephone Encounter (Signed)
Cassandra Stewart called and says she is having a serious problem with urinary frequency at night and has fallen once without injury. She says her ins will cover a "pure wick system".  Should she address this with her PCP?

## 2019-06-18 NOTE — Telephone Encounter (Signed)
Please address pure wick with PCP.  Also I think this pt will be followed in the future by Dr Berline Chough

## 2019-06-18 NOTE — Telephone Encounter (Signed)
Notified. 

## 2019-06-20 ENCOUNTER — Other Ambulatory Visit: Payer: Self-pay | Admitting: Internal Medicine

## 2019-06-20 ENCOUNTER — Other Ambulatory Visit: Payer: Self-pay | Admitting: Family Medicine

## 2019-06-20 DIAGNOSIS — I10 Essential (primary) hypertension: Secondary | ICD-10-CM

## 2019-06-21 ENCOUNTER — Other Ambulatory Visit: Payer: Self-pay | Admitting: Family Medicine

## 2019-06-21 ENCOUNTER — Encounter: Payer: Medicare Other | Admitting: Family Medicine

## 2019-06-21 ENCOUNTER — Other Ambulatory Visit: Payer: Self-pay

## 2019-06-21 ENCOUNTER — Encounter: Payer: Self-pay | Admitting: Family Medicine

## 2019-06-21 DIAGNOSIS — I10 Essential (primary) hypertension: Secondary | ICD-10-CM

## 2019-06-21 NOTE — Progress Notes (Signed)
   Subjective:     Patient ID: Cassandra Stewart, female    DOB: 12/23/1950, 69 y.o.   MRN: [id number]  HPI Chief Complaint  Patient presents with  . urination issues    urination issues- having to get up at night to go to the bathroom and fell recently- not able to walk. she would like an order for purewick.    She did not answer her text or phone call. Was not seen by me.    Review of Systems Pertinent positives and negatives in the history of present illness.     Objective:   Physical Exam Wt 220 lb (99.8 kg)   BMI 37.76 kg/m         Assessment & Plan:  No diagnosis found.

## 2019-06-21 NOTE — Telephone Encounter (Signed)
Pt cardiology took them off this

## 2019-06-28 DIAGNOSIS — G992 Myelopathy in diseases classified elsewhere: Secondary | ICD-10-CM | POA: Diagnosis not present

## 2019-06-28 DIAGNOSIS — L8995 Pressure ulcer of unspecified site, unstageable: Secondary | ICD-10-CM | POA: Diagnosis not present

## 2019-06-28 DIAGNOSIS — M4804 Spinal stenosis, thoracic region: Secondary | ICD-10-CM | POA: Diagnosis not present

## 2019-06-28 DIAGNOSIS — G8220 Paraplegia, unspecified: Secondary | ICD-10-CM | POA: Diagnosis not present

## 2019-06-28 DIAGNOSIS — M4800 Spinal stenosis, site unspecified: Secondary | ICD-10-CM | POA: Diagnosis not present

## 2019-07-06 ENCOUNTER — Encounter: Payer: Medicare Other | Attending: Registered Nurse | Admitting: Physical Medicine & Rehabilitation

## 2019-07-06 ENCOUNTER — Encounter: Payer: Self-pay | Admitting: Physical Medicine & Rehabilitation

## 2019-07-06 ENCOUNTER — Other Ambulatory Visit: Payer: Self-pay

## 2019-07-06 VITALS — BP 108/74 | HR 68 | Temp 98.2°F | Ht 64.0 in | Wt 224.0 lb

## 2019-07-06 DIAGNOSIS — M4714 Other spondylosis with myelopathy, thoracic region: Secondary | ICD-10-CM | POA: Insufficient documentation

## 2019-07-06 DIAGNOSIS — G5603 Carpal tunnel syndrome, bilateral upper limbs: Secondary | ICD-10-CM | POA: Diagnosis not present

## 2019-07-06 DIAGNOSIS — G8220 Paraplegia, unspecified: Secondary | ICD-10-CM | POA: Insufficient documentation

## 2019-07-06 NOTE — Patient Instructions (Signed)
Carpal Tunnel Syndrome  Carpal tunnel syndrome is a condition that causes pain in your hand and arm. The carpal tunnel is a narrow area that is on the palm side of your wrist. Repeated wrist motion or certain diseases may cause swelling in the tunnel. This swelling can pinch the main nerve in the wrist (median nerve). What are the causes? This condition may be caused by:  Repeated wrist motions.  Wrist injuries.  Arthritis.  A sac of fluid (cyst) or abnormal growth (tumor) in the carpal tunnel.  Fluid buildup during pregnancy. Sometimes the cause is not known. What increases the risk? The following factors may make you more likely to develop this condition:  Having a job in which you move your wrist in the same way many times. This includes jobs like being a butcher or a cashier.  Being a woman.  Having other health conditions, such as: ? Diabetes. ? Obesity. ? A thyroid gland that is not active enough (hypothyroidism). ? Kidney failure. What are the signs or symptoms? Symptoms of this condition include:  A tingling feeling in your fingers.  Tingling or a loss of feeling (numbness) in your hand.  Pain in your entire arm. This pain may get worse when you bend your wrist and elbow for a long time.  Pain in your wrist that goes up your arm to your shoulder.  Pain that goes down into your palm or fingers.  A weak feeling in your hands. You may find it hard to grab and hold items. You may feel worse at night. How is this diagnosed? This condition is diagnosed with a medical history and physical exam. You may also have tests, such as:  Electromyogram (EMG). This test checks the signals that the nerves send to the muscles.  Nerve conduction study. This test checks how well signals pass through your nerves.  Imaging tests, such as X-rays, ultrasound, and MRI. These tests check for what might be the cause of your condition. How is this treated? This condition may be treated  with:  Lifestyle changes. You will be asked to stop or change the activity that caused your problem.  Doing exercise and activities that make bones and muscles stronger (physical therapy).  Learning how to use your hand again (occupational therapy).  Medicines for pain and swelling (inflammation). You may have injections in your wrist.  A wrist splint.  Surgery. Follow these instructions at home: If you have a splint:  Wear the splint as told by your doctor. Remove it only as told by your doctor.  Loosen the splint if your fingers: ? Tingle. ? Lose feeling (become numb). ? Turn cold and blue.  Keep the splint clean.  If the splint is not waterproof: ? Do not let it get wet. ? Cover it with a watertight covering when you take a bath or a shower. Managing pain, stiffness, and swelling   If told, put ice on the painful area: ? If you have a removable splint, remove it as told by your doctor. ? Put ice in a plastic bag. ? Place a towel between your skin and the bag. ? Leave the ice on for 20 minutes, 2-3 times per day. General instructions  Take over-the-counter and prescription medicines only as told by your doctor.  Rest your wrist from any activity that may cause pain. If needed, talk with your boss at work about changes that can help your wrist heal.  Do any exercises as told by your doctor,   physical therapist, or occupational therapist.  Keep all follow-up visits as told by your doctor. This is important. Contact a doctor if:  You have new symptoms.  Medicine does not help your pain.  Your symptoms get worse. Get help right away if:  You have very bad numbness or tingling in your wrist or hand. Summary  Carpal tunnel syndrome is a condition that causes pain in your hand and arm.  It is often caused by repeated wrist motions.  Lifestyle changes and medicines are used to treat this problem. Surgery may help in very bad cases.  Follow your doctor's  instructions about wearing a splint, resting your wrist, keeping follow-up visits, and calling for help. This information is not intended to replace advice given to you by your health care provider. Make sure you discuss any questions you have with your health care provider. Document Revised: 06/20/2017 Document Reviewed: 06/20/2017 Elsevier Patient Education  2020 [employer]vier Inc.  

## 2019-07-06 NOTE — Progress Notes (Signed)
Carpal tunnel injection Bilateral  Indication: Median neuropathy at the wrist documented by EMG or ultrasound and interfering with sleep and other functional activities. Symptoms are not relieved by conservative care.  Informed consent was obtained after describing risks and benefits of the procedure with the patient. These include bleeding bruising and infection as well as nerve injury. Patient elected to proceed and has given written consent. The right distal wrist crease was marked and prepped with Betadine. A 30-gauge 1/2 inch needle was inserted and 0.25 ML's of 1% lidocaine injected into the skin and subcutaneous tissue. Then a 30-gauge 1/2 inch needle was inserted into the carpal tunnel and 0.25 mL of depomedrol 40 mg per mL was injected. Patient tolerated procedure well.  The same procedure was performed on the left side with the same needle, injectate and technique, post procedure instructions given.

## 2019-07-08 ENCOUNTER — Encounter: Payer: Self-pay | Admitting: Family Medicine

## 2019-07-14 ENCOUNTER — Other Ambulatory Visit: Payer: Self-pay | Admitting: Internal Medicine

## 2019-07-14 DIAGNOSIS — IMO0002 Reserved for concepts with insufficient information to code with codable children: Secondary | ICD-10-CM

## 2019-07-29 ENCOUNTER — Ambulatory Visit (INDEPENDENT_AMBULATORY_CARE_PROVIDER_SITE_OTHER): Payer: Medicare Other | Admitting: Internal Medicine

## 2019-07-29 ENCOUNTER — Other Ambulatory Visit: Payer: Self-pay

## 2019-07-29 ENCOUNTER — Encounter: Payer: Self-pay | Admitting: Internal Medicine

## 2019-07-29 VITALS — BP 120/60 | HR 86 | Ht 64.0 in | Wt 224.0 lb

## 2019-07-29 DIAGNOSIS — E1140 Type 2 diabetes mellitus with diabetic neuropathy, unspecified: Secondary | ICD-10-CM | POA: Diagnosis not present

## 2019-07-29 DIAGNOSIS — G8220 Paraplegia, unspecified: Secondary | ICD-10-CM | POA: Diagnosis not present

## 2019-07-29 DIAGNOSIS — E1165 Type 2 diabetes mellitus with hyperglycemia: Secondary | ICD-10-CM

## 2019-07-29 DIAGNOSIS — M4800 Spinal stenosis, site unspecified: Secondary | ICD-10-CM | POA: Diagnosis not present

## 2019-07-29 DIAGNOSIS — E782 Mixed hyperlipidemia: Secondary | ICD-10-CM | POA: Diagnosis not present

## 2019-07-29 DIAGNOSIS — G992 Myelopathy in diseases classified elsewhere: Secondary | ICD-10-CM | POA: Diagnosis not present

## 2019-07-29 DIAGNOSIS — L8995 Pressure ulcer of unspecified site, unstageable: Secondary | ICD-10-CM | POA: Diagnosis not present

## 2019-07-29 DIAGNOSIS — M4804 Spinal stenosis, thoracic region: Secondary | ICD-10-CM | POA: Diagnosis not present

## 2019-07-29 DIAGNOSIS — E538 Deficiency of other specified B group vitamins: Secondary | ICD-10-CM

## 2019-07-29 DIAGNOSIS — IMO0002 Reserved for concepts with insufficient information to code with codable children: Secondary | ICD-10-CM

## 2019-07-29 LAB — POCT GLYCOSYLATED HEMOGLOBIN (HGB A1C): Hemoglobin A1C: 9.3 % — AB (ref 4.0–5.6)

## 2019-07-29 LAB — VITAMIN B12: Vitamin B-12: >1526 pg/mL — ABNORMAL HIGH (ref 211–911)

## 2019-07-29 NOTE — Addendum Note (Signed)
Addended by: Darliss Ridgel I on: 07/29/2019 02:23 PM   Modules accepted: Orders

## 2019-07-29 NOTE — Patient Instructions (Signed)
Please continue: - Metformin ER 500 mg 1-2x a day with meals. - Toujeo 35 units at night - Humalog 7-10 units before the meals. If you are skipping a meal, check sugars are if >150, may need to take 4-5 units. - Trulicity 3 mg weekly  Please let me know about your sugars in 2 weeks.  Please continue B12 1000 mcg daily.  Please return in 3-4 months with your sugar log.

## 2019-07-29 NOTE — Progress Notes (Addendum)
Patient ID: Cassandra Stewart, female   DOB: 1950/06/07, 69 y.o.   MRN: [id number]   This visit occurred during the SARS-CoV-2 public health emergency.  Safety protocols were in place, including screening questions prior to the visit, additional usage of staff PPE, and extensive cleaning of exam room while observing appropriate contact time as indicated for disinfecting solutions.   HPI: Cassandra Stewart is a 69 y.o.-year-old female, returning for follow-up for DM2, dx in 1992, insulin-dependent since 2006, uncontrolled, with complications (CKD stage 3, PN, DR).  Last visit 4 months ago.  She had 2 steroid inj's in wrist for carpal tunnel last mo >> sugars much higher.  The injections did not help her symptoms.  She would want to avoid surgery.  Reviewed HbA1c levels: Lab Results  Component Value Date   HGBA1C 9.6 (A) 03/30/2019   HGBA1C 9.8 (A) 12/28/2018   HGBA1C 10.7 (A) 07/13/2018   HGBA1C 6.8 (A) 03/16/2018   HGBA1C 6.1 (A) 11/12/2017   HGBA1C 9.0 07/11/2017   HGBA1C 9.0 04/14/2017   HGBA1C 8.7% 10/24/2016   HGBA1C 9.6% 07/25/2016   HGBA1C 10.5% 03/04/2016   HGBA1C 8.1% 10/25/2015   HGBA1C 9.7 (H) 07/04/2015  03/16/2018: HbA1c calculated from fructosamine 6.28%  Pt was on a regimen of: - Metformin 500 mg 1x a day with dinner.  She had diarrhea with a higher dose. - Toujeo 45 units in am - Humalog 18 units 2-3x a day, before meals Tried: Actos, Lantus.  Now on: - Metformin ER 500 mg 1-2x a day with meals. - Trulicity 1.5 >> 3 mg weekly - Toujeo 35 units at night - Humalog 7-10 units before the meals (mostly 7 units with breakfast and 10 units with dinner)  She was not checking sugars at last visit.  Now checking approximately 1x a day - cannot check more 2/2 carpal tunnel sd. with numbness and pain in fingers: - am: 140-183 >> 63, 74-91 >> 90-103 >> 90-100  >> 117-194, but while on steroids: 207-383 - 2h after b'fast: n/c - before lunch: n/c >> 80-110 >> n/c >> 60s, 70 >>  n/c - 2h after lunch: n/c - before dinner: n/c >>  80-110 >> 90-100 >> 188 >> n/c - 2h after dinner: n/c - bedtime: n/c - nighttime: n/c Lowest sugar was 60s >> 117; she has hypoglycemia awareness in the 80s. Highest sugar was 188 >> 383.  Glucometer: Freestyle  Pt's meals are: - Breakfast/brunch: egg, bacon, toast >> no b'fast now - Lunch: snack mostly (no Humalog) >> Meals on Wheels - Dinner: chicken/fish + vegetables - Snacks: no  -+ Stage III CKD; last BUN/creatinine:  Lab Results  Component Value Date   BUN 18 03/24/2019   BUN 21 03/12/2019   CREATININE 1.11 (H) 03/24/2019   CREATININE 1.10 (H) 03/12/2019   Lab Results  Component Value Date   GFRAA 58 (L) 03/24/2019   GFRAA 59 (L) 03/12/2019   GFRAA 55 (L) 03/08/2019   GFRAA 58 (L) 11/30/2018   GFRAA 58 (L) 11/23/2017  Previously on lisinopril 10, but stopped due to hyperkalemia.  -+ HL; last set of lipids: Lab Results  Component Value Date   CHOL 173 12/28/2018   HDL 61.20 12/28/2018   LDLCALC 82 12/28/2018   TRIG 153.0 (H) 12/28/2018   CHOLHDL 3 12/28/2018  On lovastatin 40. - last eye exam was in 11/2017: + DR.  She has history of cataract surgery OU. -+ numbness and tingling in her feet.  On Neurontin On ASA  81.  Low vitamin B12:  Reviewed B12 levels: Lab Results  Component Value Date   VITAMINB12 >1500 (H) 03/30/2019   VITAMINB12 >1500 (H) 12/28/2018   VITAMINB12 261 03/16/2018   VITAMINB12 300 04/14/2017   VITAMINB12 478 10/25/2015  05/01/2016: Vit B12 248.  We initially started 5000 mcg B12 daily, which was then decreased to 2500 mcg daily and, at last visit, to 1000 mcg daily.  She continues to be in a wheelchair as her left leg is very weak.  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  She had surgery in 2016 but strength did not improve.  She has an extensive FH of heart disease - women in her family. Daughter died after her heart stopped.  ROS: Constitutional: no  weight gain/+ weight loss, no fatigue, no subjective hyperthermia, no subjective hypothermia Eyes: no blurry vision, no xerophthalmia ENT: no sore throat, no nodules palpated in neck, no dysphagia, no odynophagia, no hoarseness Cardiovascular: no CP/no SOB/no palpitations/no leg swelling Respiratory: no cough/no SOB/no wheezing Gastrointestinal: no N/no V/no D/no C/no acid reflux Musculoskeletal: no muscle aches/no joint aches Skin: no rashes, no hair loss Neurological: no tremors/+ numbness and tingling in fingers/no dizziness  I reviewed pt's medications, allergies, PMH, social hx, family hx, and changes were documented in the history of present illness. Otherwise, unchanged from my initial visit note.  Past Medical History:  Diagnosis Date   Elevated LFTs    Fatty liver    on [place].    Hyperlipemia    Hypertension    Uncontrolled type 2 diabetes with neuropathy [facility])    Past Surgical History:  Procedure Laterality Date   APPENDECTOMY  1969   BREAST BIOPSY Right    CATARACT EXTRACTION     May 21016   LUMBAR DISC SURGERY     LUMBAR LAMINECTOMY/DECOMPRESSION MICRODISCECTOMY N/A 07/06/2015   Procedure: Thoracic ten- eleven laminectomy for spinal canal decompression;  Surgeon: Ashok Pall, MD;  Location: [facility];  Service: Orthopedics;  Laterality: N/A;   Social History   Social History   Marital status: Divorced    Spouse name: N/A   Number of children: 0   Occupational History   None   Social History Main Topics   Smoking status: Never Smoker   Smokeless tobacco: Never Used   Alcohol use No   Drug use: No   Current Outpatient Medications on File Prior to Visit  Medication Sig Dispense Refill   ACCU-CHEK SOFTCLIX LANCETS lancets Test 3 times daily. Pt uses accu-chek softclix lancing device 200 each 3   acetaminophen (ACETAMINOPHEN 8 HOUR) 650 MG CR tablet Take 650 mg by mouth every 8 (eight) hours as needed for pain.     albuterol (PROVENTIL  HFA) 108 (90 Base) MCG/ACT inhaler Inhale 1-2 puffs into the lungs every 4 (four) hours as needed for wheezing or shortness of breath.      amLODipine (NORVASC) 10 MG tablet TAKE 1 TABLET(10 MG) BY MOUTH EVERY MORNING 90 tablet 0   aspirin EC 81 MG tablet Take 81 mg by mouth every evening.     Blood Glucose Monitoring Suppl (ACCU-CHEK AVIVA PLUS) w/Device KIT Use as advised 1 kit 1   Cholecalciferol (VITAMIN D PO) Take 1,000 mg by mouth daily.      Cyanocobalamin (VITAMIN B 12 PO) Take 5,000 mg by mouth daily.     dicyclomine (BENTYL) 10 MG capsule Take 1 capsule (10 mg total) by mouth every 8 (eight) hours as needed for  spasms. 90 capsule 1   Dulaglutide (TRULICITY) 3 XM/4.6OE SOPN Inject 3 mg into the skin once a week. 12 pen 11   gabapentin (NEURONTIN) 300 MG capsule TAKE 1 CAPSULE(300 MG) BY MOUTH THREE TIMES DAILY 270 capsule 0   glucose blood (ACCU-CHEK AVIVA PLUS) test strip Use as instructed to check blood sugar 4 times a day 400 each 12   hydrochlorothiazide (MICROZIDE) 12.5 MG capsule TAKE 1 CAPSULE(12.5 MG) BY MOUTH DAILY 90 capsule 0   insulin lispro (HUMALOG) 100 UNIT/ML KwikPen INJECT 12 TO 22 UNITS UNDER THE SKIN THREE TIMES DAILY 63 mL 1   Insulin Pen Needle (CAREFINE PEN NEEDLES) 32G X 4 MM MISC Use 4x a day 300 each 3   lovastatin (MEVACOR) 40 MG tablet TAKE 1 TABLET(40 MG) BY MOUTH AT BEDTIME 90 tablet 0   metFORMIN (GLUCOPHAGE-XR) 500 MG 24 hr tablet Take 1 tablet (500 mg total) by mouth 2 (two) times daily. 180 tablet 3   nitroGLYCERIN (NITROSTAT) 0.4 MG SL tablet Place 1 tablet (0.4 mg total) under the tongue every 5 (five) minutes as needed for chest pain. 25 tablet 3   polyethylene glycol (MIRALAX / GLYCOLAX) 17 g packet Take 17 g by mouth as needed.     TOUJEO SOLOSTAR 300 UNIT/ML SOPN Inject 35 Units into the skin at bedtime. 12 pen 3   No current facility-administered medications on file prior to visit.   Allergies  Allergen Reactions   Other      PT PREFERS TO NOT HAVE ANY NARCOTIC MEDICATIONS   Chlorhexidine Rash    Burning after using CHG wipes-used for surgery   Oxycodone Hcl     Other reaction(s): Hallucination Marked hallucinations and palpitations following dose of 61m on 04/30/2015    Family History  Problem Relation Age of Onset   Diabetes Mother    Heart disease Mother    Kidney disease Brother        Two brothers on ESRD   Cancer Sister    Amblyopia Neg Hx    Blindness Neg Hx    Cataracts Neg Hx    Glaucoma Neg Hx    Macular degeneration Neg Hx    Retinal detachment Neg Hx    Strabismus Neg Hx    Retinitis pigmentosa Neg Hx    Colon cancer Neg Hx    Esophageal cancer Neg Hx    Pt has FH of DM in M, MGM, PGM, M aunt, uncles.  PE: BP 120/60    Pulse 86    Ht _0  (1.626 m)    Wt 224 lb (101.6 kg)    SpO2 96%    BMI 38.45 kg/m  Wt Readings from Last 3 Encounters:  07/29/19 224 lb (101.6 kg)  07/06/19 224 lb (101.6 kg)  06/21/19 220 lb (99.8 kg)   Constitutional: overweight, in NAD, in wheelchair Eyes: PERRLA, EOMI, no exophthalmos ENT: moist mucous membranes, no thyromegaly, no cervical lymphadenopathy Cardiovascular: RRR, No MRG Respiratory: CTA B Gastrointestinal: abdomen soft, NT, ND, BS+ Musculoskeletal: no deformities, strength intact in all 4 Skin: moist, warm, no rashes Neurological: no tremor with outstretched hands, DTR normal in all 4  ASSESSMENT: 1. DM2, insulin-dependent, uncontrolled, with complications - CKD stage 3 - PN - DR  - Of note, we tried to prescribe the Freestyle libre CGM for her but this was not covered by the insurance  2. Low B12  3. HL  PLAN:  1. Patient with longstanding, type 2 diabetes, on oral antidiabetic  regimen with Metformin, also, basal/bolus insulin regimen and weekly GLP-1 receptor agonist, dose increased at last visit.  At that time, HbA1c was slightly lower, at 9.6%, but still very high.  She was not checking sugars as she could not get  the Contour meter to work.  We sent a new meter to her pharmacy and I advised her about the absolute importance of starting to check blood sugars.  At that time I also advised her to bolus if she is not eating, but the lower Humalog dose, in case her sugars were high. -At this visit, she tells me that her sugars did improve after our last visit but they increase significantly after her steroid injections in the middle of 06/2019.  Highest blood sugar was 383, however, she was only checking in the morning and we discussed that the morning sugars are less likely to be affected than postprandial blood sugars.  I again strongly advised her to also check sugars later in the day but she has significant discomfort and pain in her fingers due to her carpal tunnel.  Steroid injections did not work for her.  She would like to get a CGM, but I explained that she needs to check her sugars 4 times a day before we can apply for a CGM.  I advised her to do so for at least 2 weeks before next visit.  For now, unfortunately, I cannot make changes in her regimen without more data so I advised her to rotate the time of her checks throughout the day especially around dinnertime to see if her higher blood sugars in the morning are related to dinnertime blood sugars are overnight blood sugars.  They did improve after her steroid injection wore off, but they are occasionally higher than target now. - I suggested to:  Patient Instructions  Please continue: - Metformin ER 500 mg 1-2x a day with meals. - Toujeo 35 units at night - Humalog 7-10 units before the meals. If you are skipping a meal, check sugars are if >150, may need to take 4-5 units. - Trulicity 3 mg weekly  Please let me know about your sugars in 2 weeks.  Please continue B12 1000 mcg daily.  Please return in 3-4 months with your sugar log.   - we checked her HbA1c: 9.3% (only slightly better) - advised to check sugars at different times of the day - 4x a day,  rotating check times - advised for yearly eye exams >> she is not UTD - return to clinic in 3-4 months  2. Low B12 -She has a history of low vitamin B12 and we started supplementation with p.o. B12 -At last check, the level was still high so we decreased the supplement dose from 2500 to 1000 mcg daily -We will recheck the level today  3. HL -Reviewed latest lipid panel from 12/2018: LDL slightly higher than goal, HDL normal, triglycerides slightly high at goal Lab Results  Component Value Date   CHOL 173 12/28/2018   HDL 61.20 12/28/2018   LDLCALC 82 12/28/2018   TRIG 153.0 (H) 12/28/2018   CHOLHDL 3 12/28/2018  -Continues Mevacor without side effects  Office Visit on 07/29/2019  Component Date Value Ref Range Status   Vitamin B-12 07/29/2019 >1526* 211 - 911 pg/mL Final   Hemoglobin A1C 07/29/2019 9.3* 4.0 - 5.6 % Final   Her vitamin B12 level is still undetectably high.  Will make sure that she is actually taking only 1000 mcg daily.  If  she is taking this daily, will move it every other day.  We will recheck at next visit.  Philemon Kingdom, MD PhD [facility] Endocr[facility]

## 2019-08-03 ENCOUNTER — Telehealth: Payer: Self-pay | Admitting: Family Medicine

## 2019-08-03 NOTE — Telephone Encounter (Signed)
Please assist with this for her. Thanks.

## 2019-08-03 NOTE — Telephone Encounter (Signed)
Pt called and is requesting a placard put in your folder pt would like it mailed to her when done

## 2019-08-04 ENCOUNTER — Other Ambulatory Visit: Payer: Self-pay

## 2019-08-04 ENCOUNTER — Encounter: Payer: Self-pay | Admitting: Physical Medicine and Rehabilitation

## 2019-08-04 ENCOUNTER — Encounter: Payer: Medicare Other | Attending: Registered Nurse | Admitting: Physical Medicine and Rehabilitation

## 2019-08-04 VITALS — BP 123/79 | HR 67 | Temp 96.6°F | Ht 64.5 in | Wt 224.0 lb

## 2019-08-04 DIAGNOSIS — G8220 Paraplegia, unspecified: Secondary | ICD-10-CM | POA: Insufficient documentation

## 2019-08-04 DIAGNOSIS — Z993 Dependence on wheelchair: Secondary | ICD-10-CM | POA: Insufficient documentation

## 2019-08-04 DIAGNOSIS — M4714 Other spondylosis with myelopathy, thoracic region: Secondary | ICD-10-CM | POA: Diagnosis not present

## 2019-08-04 MED ORDER — BACLOFEN 10 MG PO TABS: 5.0000 mg | ORAL_TABLET | Freq: Every day | ORAL | 5 refills | Status: DC

## 2019-08-04 NOTE — Telephone Encounter (Signed)
Mailed placard form to patient

## 2019-08-04 NOTE — Telephone Encounter (Signed)
Waiting on vickie to give me form

## 2019-08-04 NOTE — Progress Notes (Signed)
Subjective:    Patient ID: Cassandra Stewart, female    DOB: 01/06/1951, 69 y.o.   MRN: [id number]  HPI Has  Pt is a 69 yr old R handed female  With hx of HTN, DM, HLD, Vit B levels are high- with hx of thoracic spondylosis/myelopathy and LE weakness- mainly LLE weak.    Has a bad L rotator cuff  On L shoulder Lately having pain in spine Spine hurting between shoulder blades.      Hx of surgery- 2016/7- thoracic-  Surgeon Dr Barbaraann Barthel - had more weakness in LLE prior to surgery and then got slightly better afterwards.    Dr Wynn Banker did Carpal tunnel steroid injections last visit- 4 weeks ago- was some help , but not great.   Has some muscle spasms/spasticity-  Mostly in the left leg- usually at night- makes it hard to go to sleep-  Doesn't sleep well due to it and tired during day.  Usually starts in early, early morning- after midnight.    Is more of a cramping- not a spasm-   Has hospital bed- had injury at nursing home- messed up L ankle- and always elevates L ankle since swollen. Can't see if spasm, but cramps up a lot.   Uses power w/c outside the home- is 69 years old Starting to "fall apart"- got from [employer]- won't do any servicing until pays off $100 she still owes.   Manual w/c- just got a new w/c- got from (was [employer] [employer] as well.  Doesn't remember brand. Has a specialized w/c cushion for pressure- thinks it's a gel cushion- doesn't remember brand- wasn't told by [employer].   Has a rolling walker- doesn't use much- not anymore-  When was doing PT, was told needs to have someone there to use RW and alone most of the time.  LLE will buckle- wants someone there to prevent her from falling.    Transfers- stand pivot transfers  Problems with constipation since born.  Miralax prn or stool softener as needed Urinates well- with no issues.   Takes tylenol 650 mg 2x/day- helpful for the back pain.       Pain Inventory Average Pain 6 Pain Right  Now 4 My pain is stabbing and tingling  In the last 24 hours, has pain interfered with the following? General activity 5 Relation with others 6 Enjoyment of life 6 What TIME of day is your pain at its worst? night Sleep (in general) Poor  Pain is worse with: unsure Pain improves with: other Relief from Meds: 4  Mobility use a walker ability to climb steps?  no do you drive?  no use a wheelchair transfers alone  Function disabled: date disabled . I need assistance with the following:  shopping  Neuro/Psych numbness  Prior Studies Any changes since last visit?  no  Physicians involved in your care Any changes since last visit?  no   Family History  Problem Relation Age of Onset   Diabetes Mother    Heart disease Mother    Kidney disease Brother        Two brothers on ESRD   Cancer Sister    Amblyopia Neg Hx    Blindness Neg Hx    Cataracts Neg Hx    Glaucoma Neg Hx    Macular degeneration Neg Hx    Retinal detachment Neg Hx    Strabismus Neg Hx    Retinitis pigmentosa Neg Hx    Colon cancer Neg  Hx    Esophageal cancer Neg Hx    Social History   Socioeconomic History   Marital status: Divorced    Spouse name: Not on file   Number of children: Not on file   Years of education: Not on file   Highest education level: Not on file  Occupational History   Not on file  Tobacco Use   Smoking status: Never Smoker   Smokeless tobacco: Never Used  Substance and Sexual Activity   Alcohol use: No   Drug use: No   Sexual activity: Not Currently  Other Topics Concern   Not on file  Social History Narrative   Not on file   Social Determinants of Health   Financial Resource Strain:    Difficulty of Paying Living Expenses:   Food Insecurity:    Worried About Charity fundraiser in the Last Year:    Arboriculturist in the Last Year:   Transportation Needs:    Film/video editor (Medical):    Lack of Transportation  (Non-Medical):   Physical Activity:    Days of Exercise per Week:    Minutes of Exercise per Session:   Stress:    Feeling of Stress :   Social Connections:    Frequency of Communication with Friends and Family:    Frequency of Social Gatherings with Friends and Family:    Attends Religious Services:    Active Member of Clubs or Organizations:    Attends Archivist Meetings:    Marital Status:    Past Surgical History:  Procedure Laterality Date   APPENDECTOMY  1969   BREAST BIOPSY Right    CATARACT EXTRACTION     May 21016   LUMBAR DISC SURGERY     LUMBAR LAMINECTOMY/DECOMPRESSION MICRODISCECTOMY N/A 07/06/2015   Procedure: Thoracic ten- eleven laminectomy for spinal canal decompression;  Surgeon: Ashok Pall, MD;  Location: [facility];  Service: Orthopedics;  Laterality: N/A;   Past Medical History:  Diagnosis Date   Elevated LFTs    Fatty liver    on [place].    Hyperlipemia    Hypertension    Uncontrolled type 2 diabetes with neuropathy (HCC)    BP 123/79    Pulse 67    Temp (!) 96.6 F (35.9 C)    Ht 5' 4.5" (1.638 m)    Wt 224 lb (101.6 kg) Comment: last recorded   SpO2 96%    BMI 37.86 kg/m   Opioid Risk Score:   Fall Risk Score:  `1  Depression screen PHQ 2/9  Depression screen [facility] 2/9 08/04/2019 08/25/2018 11/05/2017 10/21/2017 09/09/2017 08/05/2016 08/01/2016  Decreased Interest 0 0 0 0 0 0 0  Down, Depressed, Hopeless 0 0 3 0 0 0 0  PHQ - 2 Score 0 0 3 0 0 0 0   Review of Systems  Constitutional: Positive for appetite change.       Poor  HENT: Negative.   Eyes: Negative.   Respiratory: Negative.   Cardiovascular: Positive for leg swelling.  Gastrointestinal: Negative.   Endocrine:       High blood sugars  Genitourinary: Negative.   Musculoskeletal: Negative.   Skin: Negative.   Allergic/Immunologic: Negative.   Neurological: Positive for numbness.  Hematological: Negative.   Psychiatric/Behavioral: Negative.   All other  systems reviewed and are negative.      Objective:   Physical Exam Awake, alert, appropriate, in power w/c, heavier purse over R shoulder, NAD TTP over  midline spine ~ T5/6- not paraspinal pain, only in center of spine  MS: RLE- HF, KE, KF, DF and PF 4-/5 LLE- HF 2-/5, KE 3, KF 2/5, DF 4-/5, PF 3/5       Assessment & Plan:    Pt is a 69 yr old R handed female  With hx of HTN, DM, HLD, Vit B levels are high- with hx of thoracic spondylosis/myelopathy and LE weakness- mainly LLE weak.    1. Start baclofen just at night- 5-10 mg nightly- treat muscle spasms- that are keeping her up.  If doesn't work, will try something else, but usually very helpful.  Side effects- can be mild constipation and mild sleepiness- but giving at night, so that shouldn't be issue.    2. CT scan of thoracic spine- to determine if anything else going on- increasing pain.   3. Doesn't want opiates/narcotics- tylenol is sufficient-  Wants to stick with Tylenol 650 mg 2x/day- gave recommendations on [employer].   4. F/U in 3 months.    I spent a total of 30 minutes on appointment- as detailed above.

## 2019-08-04 NOTE — Patient Instructions (Signed)
  Pt is a 69 yr old R handed female  With hx of HTN, DM, HLD, Vit B levels are high- with hx of thoracic spondylosis/myelopathy and LE weakness- mainly LLE weak.    1. Start baclofen just at night- 5-10 mg nightly- treat muscle spasms- that are keeping her up.  If doesn't work, will try something else, but usually very helpful.  Side effects- can be mild constipation and mild sleepiness- but giving at night, so that shouldn't be issue.    2. CT scan of thoracic spine- to determine if anything else going on- increasing pain.   3. Doesn't want opiates/narcotics- tylenol is sufficient-  Wants to stick with Tylenol 650 mg 2x/day- gave recommendations on [employer].   4. F/U in 3 months.

## 2019-08-28 DIAGNOSIS — G8220 Paraplegia, unspecified: Secondary | ICD-10-CM | POA: Diagnosis not present

## 2019-08-28 DIAGNOSIS — L8995 Pressure ulcer of unspecified site, unstageable: Secondary | ICD-10-CM | POA: Diagnosis not present

## 2019-08-28 DIAGNOSIS — G992 Myelopathy in diseases classified elsewhere: Secondary | ICD-10-CM | POA: Diagnosis not present

## 2019-08-28 DIAGNOSIS — M4800 Spinal stenosis, site unspecified: Secondary | ICD-10-CM | POA: Diagnosis not present

## 2019-08-28 DIAGNOSIS — M4804 Spinal stenosis, thoracic region: Secondary | ICD-10-CM | POA: Diagnosis not present

## 2019-08-30 NOTE — Progress Notes (Signed)
Cassandra Stewart is a 69 y.o. female who presents for annual wellness visit, CPE and follow-up on chronic medical conditions.  She has the following concerns: She is wheelchair bound due to left leg weakness. She takes [employer] for appointments and groceries.  Lives alone. States she always has her cell phone with her.   She is considering using a system to help her not have to get out of the bed to urinate.  "pure wick system". States she needs an order to get this.  States he has fallen going to the bathroom but not for months. She has been taking her HCTZ at night.   Interested in getting Moderna vaccine.   States she has never had colon cancer screening.  Cologuard was ordered but not done due to having to mail it back. States she had issues with mobility and transportation. Does not think she can do this now either.  States she does not think she can do a prep for colonoscopy.  Is agreeable to see GI for consult to discuss screening options.   Acid reflux- occasionally has mid sternal or epigastric pain after eating. She takes Tums and has improvement.    She is followed by her back specialist for pain and leg weakness.   Diabetes is managed by Dr. Elvera Lennox.   She is also followed by cardiology.   HTN- reports taking her medications daily without any issues HL- reports taking a statin daily.     Immunization History  Administered Date(s) Administered  . Pneumococcal Polysaccharide-23 04/10/2011   Last Pap smear: years ago  Last mammogram: 06/2018 and will call to schedule  Last colonoscopy: never   Last DEXA: 12/2017 and normal  Dentist: does not have one.  Ophtho: diabetic eye Dr. Vanessa Lewis- states she is due Exercise: some leg excerises in chair  Other doctors caring for patient include: Dr. Berline Chough- Spine doctor Dr. Wynn Banker- pain management  Dr. Elvera Lennox- diabetes Dr. Rennis Golden- Cardiology  Depression screen:  See questionnaire below.  Depression screen [facility] 2/9 08/31/2019  08/04/2019 08/25/2018 11/05/2017 10/21/2017  Decreased Interest 0 0 0 0 0  Down, Depressed, Hopeless 0 0 0 3 0  PHQ - 2 Score 0 0 0 3 0    Fall Risk Screen: see questionnaire below. Fall Risk  08/31/2019 08/04/2019 04/16/2019 08/25/2018 07/21/2018  Falls in the past year? 1 0 0 0 0  Number falls in past yr: 1 - - 0 -  Injury with Fall? 0 - - 0 -  Risk Factor Category  - - - - -  Risk for fall due to : Impaired balance/gait - - - -  Follow up - - - - -    ADL screen:  See questionnaire below Functional Status Survey: Is the patient deaf or have difficulty hearing?: No Does the patient have difficulty seeing, even when wearing glasses/contacts?: Yes Does the patient have difficulty concentrating, remembering, or making decisions?: No Does the patient have difficulty walking or climbing stairs?: Yes Does the patient have difficulty dressing or bathing?: No Does the patient have difficulty doing errands alone such as visiting a doctor's office or shopping?: No   End of Life Discussion:  Patient has a living will and medical power of attorney. MOST form reviewed and signed today.   Review of Systems Constitutional: -fever, -chills, -sweats, -unexpected weight change, -anorexia, -fatigue Allergy: -sneezing, -itching, -congestion Dermatology: denies changing moles, rash, lumps, new worrisome lesions ENT: -runny nose, -ear pain, -sore throat, -hoarseness, -sinus pain, -teeth pain, -tinnitus, -  hearing loss, -epistaxis Cardiology:  -chest pain, -palpitations, -edema, -orthopnea, -paroxysmal nocturnal dyspnea Respiratory: -cough, -shortness of breath, -dyspnea on exertion, -wheezing, -hemoptysis Gastroenterology: -abdominal pain, -nausea, -vomiting, -diarrhea, -constipation, -blood in stool, -changes in bowel movement, -dysphagia Hematology: -bleeding or bruising problems Musculoskeletal: -arthralgias, -myalgias, -joint swelling, +back pain, -neck pain, -cramping, -gait changes Ophthalmology: -vision  changes, -eye redness, -itching, -discharge Urology: -dysuria, -difficulty urinating, -hematuria, -urinary frequency, -urgency, -incontinence Neurology: -headache, + left leg weakness, -tingling, -numbness, -speech abnormality, -memory loss, +falls, -dizziness Psychology:  -depressed mood, -agitation, -sleep problems    PHYSICAL EXAM:  BP 110/60   Pulse 70   Ht 5' 4.5" (1.638 m)   Wt 224 lb (101.6 kg)   BMI 37.86 kg/m   General Appearance: Alert, cooperative, no distress, appears stated age. Examined in wheelchair  Head: Normocephalic, without obvious abnormality, atraumatic Eyes: PERRL, conjunctiva/corneas clear, EOM's intact Ears: Normal TM's and external ear canals Nose: mask on  Throat: mask on  Neck: Supple, no lymphadenopathy; thyroid: no enlargement/tenderness/nodules Back: non tender  Lungs: Clear to auscultation bilaterally without wheezes, rales or ronchi; respirations unlabored Chest Wall: No tenderness or deformity Heart: Regular rate and rhythm, S1 and S2 normal, no murmur, rub or gallop Breast Exam: No tenderness, masses, or nipple discharge or inversion. No axillary lymphadenopathy Abdomen: Soft, non-tender, nondistended, normoactive bowel sounds Genitalia: not done  Extremities: No clubbing, cyanosis or edema Pulses: 2+ and symmetric all extremities Skin: Skin color, texture, turgor normal, no rashes or lesions Lymph nodes: Cervical and supraclavicular nodes normal Neurologic: CNs intact, remained seated during exam Psych: Normal mood, affect, hygiene and grooming.  ASSESSMENT/PLAN: Medicare annual wellness visit, subsequent -She is here today for Medicare annual wellness visit.  She reportedly had one fall several months ago due to having to get up at night to go to the restroom.  She takes her cell phone with her to the bathroom.  We discussed the possibility of a life alert.  I also recommended trying a bedside toilet but she is not currently in favor of  this.  She has been taking her hydrochlorothiazide at night and I recommend she take this earlier in the day since this may keep her from having to get up at night. Her mood is good.  Denies memory concerns.  Advanced directives reviewed   Routine general medical examination at a health care facility - Plan: CBC with Differential/Platelet, Comprehensive metabolic panel, T4, free, T3, TSH -She is also here today for fasting CPE.  Due to limited mobility and wheelchair dependence I did examine her in her wheelchair per her preference. Preventive health care reviewed.  She will call and schedule her mammogram.  I will also refer her to GI to discuss options for colon cancer screening.  Discussed that she has aged out for Pap smears.  Counseling on healthy lifestyle.  Immunizations reviewed.  I do encourage the Covid vaccine.  She will check with her pharmacy to see if they offer them a during the vaccine. Recommend scheduling a dental exam.   Colon cancer screening - Plan: Ambulatory referral to Gastroenterology -She is willing to see GI for consult to discuss colon cancer screening options that would work best for her.  We have tried the Cologuard in the past unsuccessfully.  Stage 3 chronic kidney disease, unspecified whether stage 3a or 3b CKD - Plan: Comprehensive metabolic panel -Continue to monitor  Uncontrolled type 2 diabetes with neuropathy (HCC) -Managed by Dr. Elvera Lennox  Weakness of left leg -Ongoing issue.  She is closely followed by spine and back specialists  Morbid obesity (HCC) -Counseling on healthy diet.  She has limited mobility  Wheelchair dependence -Reports managing fine at home.  She is able to use [employer] transportation.  Mixed hyperlipidemia - Plan: Lipid panel -Continue statin therapy.  Check fasting lipids and follow-up  Essential hypertension - Plan: CBC with Differential/Platelet, Comprehensive metabolic panel  Thoracic spinal stenosis -Followed by spine  specialist  Screening for thyroid disorder - Plan: T4, free, T3, TSH -Follow-up pending results   Discussed monthly self breast exams and yearly mammograms; at least 30 minutes of aerobic activity at least 5 days/week and weight-bearing exercise 2x/week; proper sunscreen use reviewed; healthy diet, including goals of calcium and vitamin D intake and alcohol recommendations (less than or equal to 1 drink/day) reviewed; regular seatbelt use; changing batteries in smoke detectors.  Immunization recommendations discussed.  Colonoscopy recommendations reviewed   Medicare Attestation I have personally reviewed: The patient's medical and social history Their use of alcohol, tobacco or illicit drugs Their current medications and supplements The patient's functional ability including ADLs,fall risks, home safety risks, cognitive, and hearing and visual impairment Diet and physical activities Evidence for depression or mood disorders  The patient's weight, height, and BMI have been recorded in the chart.  I have made referrals, counseling, and provided education to the patient based on review of the above and I have provided the patient with a written personalized care plan for preventive services.     Hetty Blend, NP-C   08/31/2019

## 2019-08-31 ENCOUNTER — Ambulatory Visit (INDEPENDENT_AMBULATORY_CARE_PROVIDER_SITE_OTHER): Payer: Medicare Other | Admitting: Family Medicine

## 2019-08-31 ENCOUNTER — Encounter: Payer: Self-pay | Admitting: Family Medicine

## 2019-08-31 ENCOUNTER — Other Ambulatory Visit: Payer: Self-pay

## 2019-08-31 VITALS — BP 110/60 | HR 70 | Ht 64.5 in | Wt 224.0 lb

## 2019-08-31 DIAGNOSIS — Z1211 Encounter for screening for malignant neoplasm of colon: Secondary | ICD-10-CM

## 2019-08-31 DIAGNOSIS — I10 Essential (primary) hypertension: Secondary | ICD-10-CM

## 2019-08-31 DIAGNOSIS — E782 Mixed hyperlipidemia: Secondary | ICD-10-CM | POA: Diagnosis not present

## 2019-08-31 DIAGNOSIS — Z1329 Encounter for screening for other suspected endocrine disorder: Secondary | ICD-10-CM | POA: Diagnosis not present

## 2019-08-31 DIAGNOSIS — E1140 Type 2 diabetes mellitus with diabetic neuropathy, unspecified: Secondary | ICD-10-CM

## 2019-08-31 DIAGNOSIS — R29898 Other symptoms and signs involving the musculoskeletal system: Secondary | ICD-10-CM

## 2019-08-31 DIAGNOSIS — IMO0002 Reserved for concepts with insufficient information to code with codable children: Secondary | ICD-10-CM

## 2019-08-31 DIAGNOSIS — Z0000 Encounter for general adult medical examination without abnormal findings: Secondary | ICD-10-CM | POA: Diagnosis not present

## 2019-08-31 DIAGNOSIS — N183 Chronic kidney disease, stage 3 unspecified: Secondary | ICD-10-CM

## 2019-08-31 DIAGNOSIS — Z993 Dependence on wheelchair: Secondary | ICD-10-CM

## 2019-08-31 DIAGNOSIS — E1165 Type 2 diabetes mellitus with hyperglycemia: Secondary | ICD-10-CM

## 2019-08-31 DIAGNOSIS — M4804 Spinal stenosis, thoracic region: Secondary | ICD-10-CM

## 2019-08-31 NOTE — Patient Instructions (Addendum)
  Cassandra Stewart , Thank you for taking time to come for your Medicare Wellness Visit. I appreciate your ongoing commitment to your health goals. Please review the following plan we discussed and let me know if I can assist you in the future.   These are the goals we discussed:  Call and schedule with Dr. Vanessa Rhylan for your diabetic eye exam.   Call and schedule mammogram at the breast center.   [facility] will call you to schedule a visit.   Call and schedule with a dentist.  You can call and schedule your Dentist appointment at any of the following offices:   [facility] Address: [address]  [place], [place] [address] Phone #: [phone]  [facility] Address: [address] [place], [place] [address] Phone # [phone]  J. Hazeline Junker, DDS [facility]  Address: [address]                                                                       [place], [place] [address] Phone #: [phone]      This is a list of the screening recommended for you and due dates:  Health Maintenance  Topic Date Due  . COVID-19 Vaccine (1) Never done  . Colon Cancer Screening  Never done  . Complete foot exam   07/25/2017  . Urine Protein Check  07/25/2017  . Eye exam for diabetics  12/18/2018  . Hemoglobin A1C  01/28/2020  . Mammogram  07/16/2020  . DEXA scan (bone density measurement)  Completed  .  Hepatitis C: One time screening is recommended by Center for Disease Control  (CDC) for  adults born from [birth year] through 1967.   Completed  . Flu Shot  Discontinued  . Tetanus Vaccine  Discontinued  . Pneumonia vaccines  Discontinued

## 2019-09-01 LAB — COMPREHENSIVE METABOLIC PANEL
ALT: 29 IU/L (ref 0–32)
AST: 32 IU/L (ref 0–40)
Albumin/Globulin Ratio: 1.5 (ref 1.2–2.2)
Albumin: 4.2 g/dL (ref 3.8–4.8)
Alkaline Phosphatase: 86 IU/L (ref 48–121)
BUN/Creatinine Ratio: 17 (ref 12–28)
BUN: 18 mg/dL (ref 8–27)
Bilirubin Total: 0.3 mg/dL (ref 0.0–1.2)
CO2: 25 mmol/L (ref 20–29)
Calcium: 9.7 mg/dL (ref 8.7–10.3)
Chloride: 105 mmol/L (ref 96–106)
Creatinine, Ser: 1.09 mg/dL — ABNORMAL HIGH (ref 0.57–1.00)
GFR calc Af Amer: 59 mL/min/{1.73_m2} — ABNORMAL LOW (ref >59)
GFR calc non Af Amer: 52 mL/min/{1.73_m2} — ABNORMAL LOW (ref >59)
Globulin, Total: 2.8 g/dL (ref 1.5–4.5)
Glucose: 113 mg/dL — ABNORMAL HIGH (ref 65–99)
Potassium: 4.8 mmol/L (ref 3.5–5.2)
Sodium: 144 mmol/L (ref 134–144)
Total Protein: 7 g/dL (ref 6.0–8.5)

## 2019-09-01 LAB — LIPID PANEL
Chol/HDL Ratio: 2.4 ratio (ref 0.0–4.4)
Cholesterol, Total: 152 mg/dL (ref 100–199)
HDL: 64 mg/dL
LDL Chol Calc (NIH): 67 mg/dL (ref 0–99)
Triglycerides: 121 mg/dL (ref 0–149)
VLDL Cholesterol Cal: 21 mg/dL (ref 5–40)

## 2019-09-01 LAB — T3: T3, Total: 114 ng/dL (ref 71–180)

## 2019-09-01 LAB — CBC WITH DIFFERENTIAL/PLATELET
Basophils Absolute: 0.1 x10E3/uL (ref 0.0–0.2)
Basos: 1 %
EOS (ABSOLUTE): 0.2 x10E3/uL (ref 0.0–0.4)
Eos: 3 %
Hematocrit: 37.2 % (ref 34.0–46.6)
Hemoglobin: 12.3 g/dL (ref 11.1–15.9)
Immature Grans (Abs): 0 x10E3/uL (ref 0.0–0.1)
Immature Granulocytes: 0 %
Lymphocytes Absolute: 2.7 x10E3/uL (ref 0.7–3.1)
Lymphs: 43 %
MCH: 29.1 pg (ref 26.6–33.0)
MCHC: 33.1 g/dL (ref 31.5–35.7)
MCV: 88 fL (ref 79–97)
Monocytes Absolute: 0.5 x10E3/uL (ref 0.1–0.9)
Monocytes: 8 %
Neutrophils Absolute: 2.8 x10E3/uL (ref 1.4–7.0)
Neutrophils: 45 %
Platelets: 291 x10E3/uL (ref 150–450)
RBC: 4.22 x10E6/uL (ref 3.77–5.28)
RDW: 12.6 % (ref 11.7–15.4)
WBC: 6.2 x10E3/uL (ref 3.4–10.8)

## 2019-09-01 LAB — T4, FREE: Free T4: 1.09 ng/dL (ref 0.82–1.77)

## 2019-09-01 LAB — TSH: TSH: 1.63 u[IU]/mL (ref 0.450–4.500)

## 2019-09-01 NOTE — Progress Notes (Signed)
Her labs are good overall. Kidney function is stable. Continue on all medications. Cholesterol is good.

## 2019-09-06 NOTE — Progress Notes (Addendum)
[facility] Note  09/08/2019     CHIEF COMPLAINT Patient presents for Retina Follow Up   HISTORY OF PRESENT ILLNESS: Cassandra Stewart is a 69 y.o. female who presents to the clinic today for:   HPI    Retina Follow Up    Patient presents with  Diabetic Retinopathy.  In both eyes.  Duration of 1.5 years.  I, the attending physician,  performed the HPI with the patient and updated documentation appropriately.          Comments    Eyes are very dry. When she wakes up she can see colors but not shapes.  It takes her awhile before eyes come in to focus.  On occasion she will have a blur OS that comes and goes.   BS: 124 this am, A1C: 9.3       Last edited by Bernarda Caffey, MD on 09/08/2019 10:17 AM. (History)    Pt states her last A1c was 9.3, her blood sugar this morning was 124 which is high for her, she states it usually runs around 112, she uses AT's in the morning  Referring physician: Girtha Rm, NP-C [place],  [place] [address]  HISTORICAL INFORMATION:   Selected notes from the MEDICAL RECORD NUMBER Referred by V. Raenette Rover, NP-C for DM exam LEE-  Ocular Hx- pseudophakia OU (2016, Dr. Candiss Norse at [facility]) [place]- DM2 on insulin, HTN,     CURRENT MEDICATIONS: Current Outpatient Medications (Ophthalmic Drugs)  Medication Sig  . prednisoLONE acetate (PRED FORTE) 1 % ophthalmic suspension Place 1 drop into the left eye 4 (four) times daily.   No current facility-administered medications for this visit. (Ophthalmic Drugs)   Current Outpatient Medications (Other)  Medication Sig  . ACCU-CHEK SOFTCLIX LANCETS lancets Test 3 times daily. Pt uses accu-chek softclix lancing device  . acetaminophen (ACETAMINOPHEN 8 HOUR) 650 MG CR tablet Take 650 mg by mouth every 8 (eight) hours as needed for pain.  Marland Kitchen albuterol (PROVENTIL HFA) 108 (90 Base) MCG/ACT inhaler Inhale 1-2 puffs into the lungs every 4 (four) hours as needed for  wheezing or shortness of breath.   Marland Kitchen amLODipine (NORVASC) 10 MG tablet TAKE 1 TABLET(10 MG) BY MOUTH EVERY MORNING  . aspirin EC 81 MG tablet Take 81 mg by mouth every evening.  . baclofen (LIORESAL) 10 MG tablet Take 0.5-1 tablets (5-10 mg total) by mouth at bedtime. For muscle spasms- at night.  . Blood Glucose Monitoring Suppl (ACCU-CHEK AVIVA PLUS) w/Device KIT Use as advised  . Cholecalciferol (VITAMIN D PO) Take 1,000 mg by mouth daily.   . Cyanocobalamin (VITAMIN B 12 PO) Take 5,000 mg by mouth daily.  Marland Kitchen dicyclomine (BENTYL) 10 MG capsule Take 1 capsule (10 mg total) by mouth every 8 (eight) hours as needed for spasms.  . Dulaglutide (TRULICITY) 3 OZ/2.2QM SOPN Inject 3 mg into the skin once a week.  . gabapentin (NEURONTIN) 300 MG capsule TAKE 1 CAPSULE(300 MG) BY MOUTH THREE TIMES DAILY  . glucose blood (ACCU-CHEK AVIVA PLUS) test strip Use as instructed to check blood sugar 4 times a day  . hydrochlorothiazide (MICROZIDE) 12.5 MG capsule TAKE 1 CAPSULE(12.5 MG) BY MOUTH DAILY  . insulin lispro (HUMALOG) 100 UNIT/ML KwikPen INJECT 12 TO 22 UNITS UNDER THE SKIN THREE TIMES DAILY  . Insulin Pen Needle (CAREFINE PEN NEEDLES) 32G X 4 MM MISC Use 4x a day  . lovastatin (MEVACOR) 40 MG tablet TAKE 1 TABLET(40  MG) BY MOUTH AT BEDTIME  . metFORMIN (GLUCOPHAGE-XR) 500 MG 24 hr tablet Take 1 tablet (500 mg total) by mouth 2 (two) times daily.  . nitroGLYCERIN (NITROSTAT) 0.4 MG SL tablet Place 1 tablet (0.4 mg total) under the tongue every 5 (five) minutes as needed for chest pain.  . polyethylene glycol (MIRALAX / GLYCOLAX) 17 g packet Take 17 g by mouth as needed.  Nelva Nay SOLOSTAR 300 UNIT/ML SOPN Inject 35 Units into the skin at bedtime.   No current facility-administered medications for this visit. (Other)      REVIEW OF SYSTEMS: ROS    Positive for: Musculoskeletal, Endocrine, Eyes   Negative for: Constitutional, Gastrointestinal, Neurological, Skin, Genitourinary, HENT,  Cardiovascular, Respiratory, Psychiatric, Allergic/Imm, Heme/Lymph   Last edited by Leonie Douglas, COA on 09/08/2019  9:25 AM. (History)       ALLERGIES Allergies  Allergen Reactions  . Other     PT PREFERS TO NOT HAVE ANY NARCOTIC MEDICATIONS  . Chlorhexidine Rash    Burning after using CHG wipes-used for surgery  . Oxycodone Hcl     Other reaction(s): Hallucination Marked hallucinations and palpitations following dose of 63m on 04/30/2015     PAST MEDICAL HISTORY Past Medical History:  Diagnosis Date  . Elevated LFTs   . Fatty liver    on [place]   .Marland KitchenHyperlipemia   . Hypertension   . Uncontrolled type 2 diabetes with neuropathy ([facility]    Past Surgical History:  Procedure Laterality Date  . APPENDECTOMY  1969  . BREAST BIOPSY Right   . CATARACT EXTRACTION     May 21016  . LUMBAR DISC SURGERY    . LUMBAR LAMINECTOMY/DECOMPRESSION MICRODISCECTOMY N/A 07/06/2015   Procedure: Thoracic ten- eleven laminectomy for spinal canal decompression;  Surgeon: KAshok Pall MD;  [facility];  Service: Orthopedics;  Laterality: N/A;    FAMILY HISTORY Family History  Problem Relation Age of Onset  . Diabetes Mother   . Heart disease Mother   . Kidney disease Brother        Two brothers on ESRD  . Cancer Sister   . Amblyopia Neg Hx   . Blindness Neg Hx   . Cataracts Neg Hx   . Glaucoma Neg Hx   . Macular degeneration Neg Hx   . Retinal detachment Neg Hx   . Strabismus Neg Hx   . Retinitis pigmentosa Neg Hx   . Colon cancer Neg Hx   . Esophageal cancer Neg Hx     SOCIAL HISTORY Social History   Tobacco Use  . Smoking status: Never Smoker  . Smokeless tobacco: Never Used  Vaping Use  . Vaping Use: Never used  Substance Use Topics  . Alcohol use: No  . Drug use: No         OPHTHALMIC EXAM:  Base Eye Exam    Visual Acuity (Snellen - Linear)      Right Left   Dist [place] 20/30 +2 20/25 +2   Dist ph [place] 20/20 NI       Tonometry (Tonopen, 9:31 AM)       Right Left   Pressure 15 14       Pupils      Dark Light Shape React APD   Right 2 1 Round Brisk None   Left 2 1 Round Brisk None       Visual Fields (Counting fingers)      Left Right    Full Full  Extraocular Movement      Right Left    Full Full       Neuro/Psych    Oriented x3: Yes   Mood/Affect: Normal       Dilation    Both eyes: 1.0% Mydriacyl, 2.5% Phenylephrine @ 9:31 AM        Slit Lamp and Fundus Exam    External Exam      Right Left   External Normal Normal       Slit Lamp Exam      Right Left   Lids/Lashes Dermatochalasis - upper lid, mild Meibomian gland dysfunction Dermatochalasis - upper lid   Conjunctiva/Sclera White and quiet Trace Melanosis   Cornea 2-3+ Punctate epithelial erosions, mild arcus; Well healed temporal cataract wounds 1-2+ central Punctate epithelial erosions, Arcus, Well healed temporal cataract wounds, mild Debris in tear film   Anterior Chamber Deep and quiet Deep and quiet   Iris Round and well dilated, No NVI Round and dilated, No NVI   Lens PC IOL in good postion, inferior 1+ Posterior capsular opacification PC IOL in good postion, Inferior 1-2+ Posterior capsular opacification   Vitreous Vitreous syneresis, Posterior vitreous detachment Vitreous syneresis, Posterior vitreous detachment       Fundus Exam      Right Left   Disc Pink and Sharp Mildly Tilted disc, Pink and Sharp   C/D Ratio 0.55 0.6   Macula Flat, Good foveal reflex, mild RPE mottling, No heme or edema Flat, Good foveal reflex, mild RPE mottling, No heme or edema   Vessels Mild Vascular attenuation Vascular attenuation, mild tortuousity   Periphery Attached; rare scattered MA/DBH mostly posterior Attached; rare scattered MA/DBH mostly posterior          IMAGING AND PROCEDURES  Imaging and Procedures for 06/09/17  OCT, Retina - OU - Both Eyes       Right Eye Quality was good. Central Foveal Thickness: 268. Progression has been stable. Findings  include normal foveal contour, no SRF, no IRF (No DME ).   Left Eye Quality was good. Central Foveal Thickness: 274. Progression has been stable. Findings include normal foveal contour, no SRF, no IRF (No DME).   Notes *Images captured and stored on drive  Diagnosis / Impression:  NFP, No SRF OU No DME OU  Clinical management:  See below  Abbreviations: NFP - Normal foveal profile. CME - cystoid macular edema. PED - pigment epithelial detachment. IRF - intraretinal fluid. SRF - subretinal fluid. EZ - ellipsoid zone. ERM - epiretinal membrane. ORA - outer retinal atrophy. ORT - outer retinal tubulation. SRHM - subretinal hyper-reflective material         Yag Capsulotomy - OS - Left Eye       Time Out Confirmed correct patient, procedure, site, and patient consented.   Anesthesia Topical anesthesia was used. Anesthesia medications included Brimonidine 0.2%, Proparacaine.   Laser Information The type of laser was yag.   Notes Procedure note: YAG Capsulotomy, left Eye  Informed consent obtained. Pre-op dilating drops (1% Topicamide and 2.5% Phenylephrine), topical anesthesia and brimonidine given. Power: 6.5 mJ Shots: 12 Posterior capsulotomy in cruciate formation performed without difficulty. Patient tolerated procedure well. No complications. Rx pred forte 4 times a day for 7 days, then stop. Pt received written and verbal post laser education. Recheck in 3 weeks w/ dilated exam.                 ASSESSMENT/PLAN:    ICD-10-CM   1.  Mild nonproliferative diabetic retinopathy of both eyes without macular edema associated with type 2 diabetes mellitus ([employer])  O16.0737   2. 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 Retinal edema  H35.81 OCT, Retina - OU - Both Eyes  4. Essential hypertension  I10   5. Hypertensive retinopathy of both eyes  H35.033   6. Posterior vitreous detachment of both eyes  H43.813   7. Pseudophakia of both eyes   Z96.1 Yag Capsulotomy - OS - Left Eye  8. PCO (posterior capsular opacification), left  H26.492 Yag Capsulotomy - OS - Left Eye    1-3. DM2 w/ mild non-proliferative diabetic retinopathy, OU  - exam with scattered MAs and improved CWSs OU  - OCT without clinically significant diabetic macular edema OU  - VA remains 20/20 OD; 20/25 OS  - f/u in 9-12 mos -- repeat DFE, OCT  4,5. Hypertensive retinopathy OU - mild  - discussed importance of tight BP control  - monitor  6. PVD / vitreous syneresis  - asymptomatic  - Discussed findings and prognosis  - No RT or RD on 360 peripheral exam  - Reviewed s/s of RT/RD  - Strict return precautions for any such RT/RD signs/symptoms  7. Pseudophakia OU  - s/p CE/IOL in 2016, [facility], Dr. Candiss Norse  - beautiful surgeries, doing well  - monitor  8. PCO OU  - pt reports significant glare and blur symptoms OS  - recommend YAG OS today, 07.14.21  - pt wishes to proceed with procedure  - RBA of procedure discussed, questions answered  - informed consent obtained and signed  - see procedure note  - start PF QID OS x7 days  - f/u 3 weeks, DFE, OCT   Ophthalmic Meds Ordered this visit:  Meds ordered this encounter  Medications  . prednisoLONE acetate (PRED FORTE) 1 % ophthalmic suspension    Sig: Place 1 drop into the left eye 4 (four) times daily.    Dispense:  10 mL    Refill:  0       Return in about 3 weeks (around 09/29/2019) for s/p YAG OS, DFE, OCT.  There are no Patient Instructions on file for this visit.   Explained the diagnoses, plan, and follow up with the patient and they expressed understanding.  Patient expressed understanding of the importance of proper follow up care.   This document serves as a record of services personally performed by Gardiner Sleeper, MD, PhD. It was created on their behalf by San Jetty. Owens Shark, OA an ophthalmic technician. The creation of this record is the provider's dictation and/or activities  during the visit.    Electronically signed by: San Jetty. Marguerita Merles 07.14.2021 12:04 PM    Gardiner Sleeper, M.D., Ph.D. Diseases & Surgery of the Retina and Vitreous [facility]  I have reviewed the above documentation for accuracy and completeness, and I agree with the above. Gardiner Sleeper, M.D., Ph.D. 09/08/19 12:04 PM   Abbreviations: M myopia (nearsighted); A astigmatism; H hyperopia (farsighted); P presbyopia; Mrx spectacle prescription;  CTL contact lenses; OD right eye; OS left eye; OU both eyes  XT exotropia; ET esotropia; PEK punctate epithelial keratitis; PEE punctate epithelial erosions; DES dry eye syndrome; MGD meibomian gland dysfunction; ATs artificial tears; PFAT's preservative free artificial tears; [place] nuclear sclerotic cataract; PSC posterior subcapsular cataract; ERM epi-retinal membrane; PVD posterior vitreous detachment; RD retinal detachment; DM diabetes mellitus; DR diabetic  retinopathy; NPDR non-proliferative diabetic retinopathy; PDR proliferative diabetic retinopathy; CSME clinically significant macular edema; DME diabetic macular edema; dbh dot blot hemorrhages; CWS cotton wool spot; POAG primary open angle glaucoma; C/D cup-to-disc ratio; HVF humphrey visual field; GVF goldmann visual field; OCT optical coherence tomography; IOP intraocular pressure; BRVO Branch retinal vein occlusion; CRVO central retinal vein occlusion; CRAO central retinal artery occlusion; BRAO branch retinal artery occlusion; RT retinal tear; SB scleral buckle; PPV pars plana vitrectomy; VH Vitreous hemorrhage; PRP panretinal laser photocoagulation; IVK intravitreal kenalog; VMT vitreomacular traction; MH Macular hole;  NVD neovascularization of the disc; NVE neovascularization elsewhere; AREDS age related eye disease study; ARMD age related macular degeneration; POAG primary open angle glaucoma; EBMD epithelial/anterior basement membrane dystrophy; ACIOL anterior chamber  intraocular lens; IOL intraocular lens; PCIOL posterior chamber intraocular lens; Phaco/IOL phacoemulsification with intraocular lens placement; [place] photorefractive keratectomy; LASIK laser assisted in situ keratomileusis; HTN hypertension; DM diabetes mellitus; COPD chronic obstructive pulmonary disease

## 2019-09-08 ENCOUNTER — Ambulatory Visit (INDEPENDENT_AMBULATORY_CARE_PROVIDER_SITE_OTHER): Payer: Medicare Other | Admitting: Ophthalmology

## 2019-09-08 ENCOUNTER — Other Ambulatory Visit: Payer: Self-pay

## 2019-09-08 ENCOUNTER — Encounter (INDEPENDENT_AMBULATORY_CARE_PROVIDER_SITE_OTHER): Payer: Self-pay | Admitting: Ophthalmology

## 2019-09-08 DIAGNOSIS — H26492 Other secondary cataract, left eye: Secondary | ICD-10-CM | POA: Diagnosis not present

## 2019-09-08 DIAGNOSIS — E113293 Type 2 diabetes mellitus with mild nonproliferative diabetic retinopathy without macular edema, bilateral: Secondary | ICD-10-CM

## 2019-09-08 DIAGNOSIS — H3581 Retinal edema: Secondary | ICD-10-CM

## 2019-09-08 DIAGNOSIS — Z961 Presence of intraocular lens: Secondary | ICD-10-CM

## 2019-09-08 DIAGNOSIS — I10 Essential (primary) hypertension: Secondary | ICD-10-CM | POA: Diagnosis not present

## 2019-09-08 DIAGNOSIS — H43813 Vitreous degeneration, bilateral: Secondary | ICD-10-CM

## 2019-09-08 DIAGNOSIS — E11319 Type 2 diabetes mellitus with unspecified diabetic retinopathy without macular edema: Secondary | ICD-10-CM

## 2019-09-08 DIAGNOSIS — H35049 Retinal micro-aneurysms, unspecified, unspecified eye: Secondary | ICD-10-CM

## 2019-09-08 DIAGNOSIS — H35033 Hypertensive retinopathy, bilateral: Secondary | ICD-10-CM

## 2019-09-08 MED ORDER — PREDNISOLONE ACETATE 1 % OP SUSP
1.0000 [drp] | Freq: Four times a day (QID) | OPHTHALMIC | 0 refills | Status: DC
Start: 2019-09-08 — End: 2020-12-04

## 2019-09-14 ENCOUNTER — Other Ambulatory Visit: Payer: Self-pay | Admitting: Internal Medicine

## 2019-09-14 ENCOUNTER — Telehealth: Payer: Self-pay | Admitting: Internal Medicine

## 2019-09-14 MED ORDER — CAREFINE PEN NEEDLES 32G X 4 MM MISC: 3 refills | Status: DC

## 2019-09-14 NOTE — Telephone Encounter (Signed)
Medication Refill Request  Did you call your pharmacy and request this refill first? Yes . If patient has not contacted pharmacy first, instruct them to do so for future refills.  . Remind them that contacting the pharmacy for their refill is the quickest method to get the refill.  . Refill policy also stated that it will take anywhere between 24-72 hours to receive the refill.    Name of medication?   BD Nano Ultrafine Pen Needles Universal Fit 60mm x 32G  Is this a 90 day supply? Yes  Name and location of pharmacy?   [id number] [place], [place] - [address] AT [facility] OF [address] Phone:  [phone]  Fax:  [phone]       . Is the request for diabetes test strips? No . If yes, what brand? N/A

## 2019-09-14 NOTE — Telephone Encounter (Signed)
RX sent

## 2019-09-16 ENCOUNTER — Ambulatory Visit: Payer: Medicare Other | Attending: Internal Medicine

## 2019-09-16 DIAGNOSIS — Z23 Encounter for immunization: Secondary | ICD-10-CM

## 2019-09-16 NOTE — Progress Notes (Signed)
   Covid-19 Vaccination Clinic  Name:  Cassandra Stewart    MRN: [id number] DOB: 07-18-1950  09/16/2019  Ms. Newborn was observed post Covid-19 immunization for 15 minutes without incident. She was provided with Vaccine Information Sheet and instruction to access the V-Safe system.   [name] was instructed to call 911 with any severe reactions post vaccine: Marland Kitchen Difficulty breathing  . Swelling of face and throat  . A fast heartbeat  . A bad rash all over body  . Dizziness and weakness   Immunizations Administered    Name Date Dose VIS Date Route   Pfizer COVID-19 Vaccine 09/16/2019 10:55 AM 0.3 mL 04/21/2018 Intramuscular   Manufacturer: [employer], [employer]   Lot: [id number]   NDC: [address]-2

## 2019-09-18 ENCOUNTER — Other Ambulatory Visit: Payer: Self-pay | Admitting: Registered Nurse

## 2019-09-18 ENCOUNTER — Other Ambulatory Visit: Payer: Self-pay | Admitting: Family Medicine

## 2019-09-18 DIAGNOSIS — I10 Essential (primary) hypertension: Secondary | ICD-10-CM

## 2019-09-18 DIAGNOSIS — E782 Mixed hyperlipidemia: Secondary | ICD-10-CM

## 2019-09-22 NOTE — Progress Notes (Signed)
[facility] Note  09/29/2019     CHIEF COMPLAINT Patient presents for Retina Follow Up   HISTORY OF PRESENT ILLNESS: Cassandra Stewart is a 69 y.o. female who presents to the clinic today for:   HPI    Retina Follow Up    Patient presents with  Other.  In left eye.  This started 33 weeks ago.  Severity is moderate.  I, the attending physician,  performed the HPI with the patient and updated documentation appropriately.          Comments    Patient here for 3 weeks retina follow up for po yag OS. Patient states vision doing ok. A little blurry. No eye pain.        Last edited by Bernarda Caffey, MD on 09/29/2019  8:43 AM. (History)    Pt states her  Referring physician: Girtha Rm, NP-C [place],  [place] [id number]  HISTORICAL INFORMATION:   Selected notes from the MEDICAL RECORD NUMBER Referred by V. Raenette Rover, NP-C for DM exam LEE-  Ocular Hx- pseudophakia OU (2016, Dr. Candiss Norse at [facility]) [place]- DM2 on insulin, HTN,     CURRENT MEDICATIONS: Current Outpatient Medications (Ophthalmic Drugs)  Medication Sig  . prednisoLONE acetate (PRED FORTE) 1 % ophthalmic suspension Place 1 drop into the left eye 4 (four) times daily.   No current facility-administered medications for this visit. (Ophthalmic Drugs)   Current Outpatient Medications (Other)  Medication Sig  . ACCU-CHEK SOFTCLIX LANCETS lancets Test 3 times daily. Pt uses accu-chek softclix lancing device  . acetaminophen (ACETAMINOPHEN 8 HOUR) 650 MG CR tablet Take 650 mg by mouth every 8 (eight) hours as needed for pain.  Marland Kitchen albuterol (PROVENTIL HFA) 108 (90 Base) MCG/ACT inhaler Inhale 1-2 puffs into the lungs every 4 (four) hours as needed for wheezing or shortness of breath.   Marland Kitchen amLODipine (NORVASC) 10 MG tablet TAKE 1 TABLET(10 MG) BY MOUTH EVERY MORNING  . aspirin EC 81 MG tablet Take 81 mg by mouth every evening.  . baclofen (LIORESAL) 10 MG tablet Take 0.5-1  tablets (5-10 mg total) by mouth at bedtime. For muscle spasms- at night.  . Blood Glucose Monitoring Suppl (ACCU-CHEK AVIVA PLUS) w/Device KIT Use as advised  . Cholecalciferol (VITAMIN D PO) Take 1,000 mg by mouth daily.   . Cyanocobalamin (VITAMIN B 12 PO) Take 5,000 mg by mouth daily.  Marland Kitchen dicyclomine (BENTYL) 10 MG capsule Take 1 capsule (10 mg total) by mouth every 8 (eight) hours as needed for spasms.  . Dulaglutide (TRULICITY) 3 DJ/2.4QA SOPN Inject 3 mg into the skin once a week.  . gabapentin (NEURONTIN) 300 MG capsule TAKE 1 CAPSULE(300 MG) BY MOUTH THREE TIMES DAILY  . glucose blood (ACCU-CHEK AVIVA PLUS) test strip Use as instructed to check blood sugar 4 times a day  . hydrochlorothiazide (MICROZIDE) 12.5 MG capsule TAKE 1 CAPSULE(12.5 MG) BY MOUTH DAILY  . insulin lispro (HUMALOG) 100 UNIT/ML KwikPen INJECT 12 TO 22 UNITS UNDER THE SKIN THREE TIMES DAILY  . Insulin Pen Needle (CAREFINE PEN NEEDLES) 32G X 4 MM MISC Use 4x a day  . lovastatin (MEVACOR) 40 MG tablet TAKE 1 TABLET(40 MG) BY MOUTH AT BEDTIME  . metFORMIN (GLUCOPHAGE-XR) 500 MG 24 hr tablet Take 1 tablet (500 mg total) by mouth 2 (two) times daily.  . nitroGLYCERIN (NITROSTAT) 0.4 MG SL tablet Place 1 tablet (0.4 mg total) under the tongue every 5 (five) minutes  as needed for chest pain.  . polyethylene glycol (MIRALAX / GLYCOLAX) 17 g packet Take 17 g by mouth as needed.  Nelva Nay SOLOSTAR 300 UNIT/ML SOPN Inject 35 Units into the skin at bedtime.   No current facility-administered medications for this visit. (Other)      REVIEW OF SYSTEMS: ROS    Positive for: Musculoskeletal, Endocrine, Eyes   Negative for: Constitutional, Gastrointestinal, Neurological, Skin, Genitourinary, HENT, Cardiovascular, Respiratory, Psychiatric, Allergic/Imm, Heme/Lymph   Last edited by Theodore Demark, COA on 09/29/2019  8:30 AM. (History)       ALLERGIES Allergies  Allergen Reactions  . Other     PT PREFERS TO NOT HAVE ANY  NARCOTIC MEDICATIONS  . Chlorhexidine Rash    Burning after using CHG wipes-used for surgery  . Oxycodone Hcl     Other reaction(s): Hallucination Marked hallucinations and palpitations following dose of 28m on 04/30/2015     PAST MEDICAL HISTORY Past Medical History:  Diagnosis Date  . Elevated LFTs   . Fatty liver    on [place]   .Marland KitchenHyperlipemia   . Hypertension   . Uncontrolled type 2 diabetes with neuropathy ([facility]    Past Surgical History:  Procedure Laterality Date  . APPENDECTOMY  1969  . BREAST BIOPSY Right   . CATARACT EXTRACTION     May 21016  . LUMBAR DISC SURGERY    . LUMBAR LAMINECTOMY/DECOMPRESSION MICRODISCECTOMY N/A 07/06/2015   Procedure: Thoracic ten- eleven laminectomy for spinal canal decompression;  Surgeon: KAshok Pall MD;  [facility];  Service: Orthopedics;  Laterality: N/A;    FAMILY HISTORY Family History  Problem Relation Age of Onset  . Diabetes Mother   . Heart disease Mother   . Kidney disease Brother        Two brothers on ESRD  . Cancer Sister   . Amblyopia Neg Hx   . Blindness Neg Hx   . Cataracts Neg Hx   . Glaucoma Neg Hx   . Macular degeneration Neg Hx   . Retinal detachment Neg Hx   . Strabismus Neg Hx   . Retinitis pigmentosa Neg Hx   . Colon cancer Neg Hx   . Esophageal cancer Neg Hx     SOCIAL HISTORY Social History   Tobacco Use  . Smoking status: Never Smoker  . Smokeless tobacco: Never Used  Vaping Use  . Vaping Use: Never used  Substance Use Topics  . Alcohol use: No  . Drug use: No         OPHTHALMIC EXAM:  Base Eye Exam    Visual Acuity (Snellen - Linear)      Right Left   Dist [place] 20/25 -2 20/20       Tonometry (Tonopen, 8:28 AM)      Right Left   Pressure def 15       Pupils      Dark Light Shape React APD   Right 2 1 Round Brisk None   Left 2 1 Round Brisk None       Visual Fields (Counting fingers)      Left Right    Full Full       Extraocular Movement      Right Left     Full, Ortho Full, Ortho       Neuro/Psych    Oriented x3: Yes   Mood/Affect: Normal       Dilation    Left eye: 1.0% Mydriacyl, 2.5% Phenylephrine @ 8:28  AM        Slit Lamp and Fundus Exam    External Exam      Right Left   External Normal Normal       Slit Lamp Exam      Right Left   Lids/Lashes Dermatochalasis - upper lid, mild Meibomian gland dysfunction Dermatochalasis - upper lid   Conjunctiva/Sclera White and quiet Trace Melanosis   Cornea 2+ Punctate epithelial erosions, mild arcus; Well healed temporal cataract wounds 1+ central Punctate epithelial erosions, Arcus, Well healed temporal cataract wounds   Anterior Chamber Deep and quiet Deep and quiet   Iris Round and well dilated, No NVI Round and dilated, No NVI   Lens PC IOL in good postion, inferior 1+ Posterior capsular opacification PC IOL in good position with open PC   Vitreous Vitreous syneresis, Posterior vitreous detachment Vitreous syneresis, Posterior vitreous detachment       Fundus Exam      Right Left   Disc Pink and Sharp Mildly Tilted disc, Pink and Sharp   C/D Ratio 0.55 0.6   Macula Flat, Good foveal reflex, mild RPE mottling, No heme or edema Flat, Good foveal reflex, mild RPE mottling, No heme or edema   Vessels Mild Vascular attenuation Vascular attenuation, mild tortuousity   Periphery Attached; rare scattered MA/DBH mostly posterior Attached; rare scattered MA/DBH mostly posterior          IMAGING AND PROCEDURES  Imaging and Procedures for 06/09/17  OCT, Retina - OU - Both Eyes       Right Eye Quality was good. Central Foveal Thickness: 266. Progression has been stable. Findings include normal foveal contour, no SRF, no IRF (No DME ).   Left Eye Quality was good. Central Foveal Thickness: 279. Progression has been stable. Findings include normal foveal contour, no SRF, no IRF (No DME).   Notes *Images captured and stored on drive  Diagnosis / Impression:  NFP, No SRF OU No  DME OU  Clinical management:  See below  Abbreviations: NFP - Normal foveal profile. CME - cystoid macular edema. PED - pigment epithelial detachment. IRF - intraretinal fluid. SRF - subretinal fluid. EZ - ellipsoid zone. ERM - epiretinal membrane. ORA - outer retinal atrophy. ORT - outer retinal tubulation. SRHM - subretinal hyper-reflective material                  ASSESSMENT/PLAN:    ICD-10-CM   1. Mild nonproliferative diabetic retinopathy of both eyes without macular edema associated with type 2 diabetes mellitus ([place])  Z61.0960   2. Diabetic retinal microaneurysm ([place])  E11.319    H35.049   3. Retinal edema  H35.81 OCT, Retina - OU - Both Eyes  4. Essential hypertension  I10   5. Hypertensive retinopathy of both eyes  H35.033   6. Posterior vitreous detachment of both eyes  H43.813   7. Pseudophakia of both eyes  Z96.1   8. PCO (posterior capsular opacification), left  H26.492     1-3. DM2 w/ mild non-proliferative diabetic retinopathy, OU  - exam with scattered MAs and improved CWSs OU  - OCT without clinically significant diabetic macular edema OU  - VA remains 20/25 OD; 20/20 OS  - f/u in 9-12 mos -- repeat DFE, OCT  4,5. Hypertensive retinopathy OU - mild  - discussed importance of tight BP control  - monitor  6. PVD / vitreous syneresis  - asymptomatic  - Discussed findings and prognosis  - No RT or RD  on 360 peripheral exam  - Reviewed s/s of RT/RD  - Strict return precautions for any such RT/RD symptoms  7. Pseudophakia OU  - s/p CE/IOL in 2016, [facility], Dr. Candiss Norse  - beautiful surgeries, doing well  - monitor  8. PCO OU  - s/p YAG OS (07.14.21)  - good opening, BCVA improved to 20/20 OS  - completed PF QID OS x7 days  - monitor   Ophthalmic Meds Ordered this visit:  No orders of the defined types were placed in this encounter.      Return in about 9 months (around 06/28/2020) for f/u DM exam, DFE, OCT.  There are no Patient  Instructions on file for this visit.   Explained the diagnoses, plan, and follow up with the patient and they expressed understanding.  Patient expressed understanding of the importance of proper follow up care.   This document serves as a record of services personally performed by Gardiner Sleeper, MD, PhD. It was created on their behalf by Roselee Nova, COMT. The creation of this record is the provider's dictation and/or activities during the visit.  Electronically signed by: Roselee Nova, COMT 09/29/19 12:53 PM   This document serves as a record of services personally performed by Gardiner Sleeper, MD, PhD. It was created on their behalf by San Jetty. Owens Shark, OA an ophthalmic technician. The creation of this record is the provider's dictation and/or activities during the visit.    Electronically signed by: San Jetty. Owens Shark, [place] 08.04.2021 12:53 PM  Gardiner Sleeper, M.D., Ph.D. Diseases & Surgery of the Retina and Vitreous [facility]  I have reviewed the above documentation for accuracy and completeness, and I agree with the above. Gardiner Sleeper, M.D., Ph.D. 09/29/19 12:53 PM   Abbreviations: M myopia (nearsighted); A astigmatism; H hyperopia (farsighted); P presbyopia; Mrx spectacle prescription;  CTL contact lenses; OD right eye; OS left eye; OU both eyes  XT exotropia; ET esotropia; PEK punctate epithelial keratitis; PEE punctate epithelial erosions; DES dry eye syndrome; MGD meibomian gland dysfunction; ATs artificial tears; PFAT's preservative free artificial tears; [place] nuclear sclerotic cataract; PSC posterior subcapsular cataract; ERM epi-retinal membrane; PVD posterior vitreous detachment; RD retinal detachment; DM diabetes mellitus; DR diabetic retinopathy; NPDR non-proliferative diabetic retinopathy; PDR proliferative diabetic retinopathy; CSME clinically significant macular edema; DME diabetic macular edema; dbh dot blot hemorrhages; CWS cotton wool spot; POAG  primary open angle glaucoma; C/D cup-to-disc ratio; HVF humphrey visual field; GVF goldmann visual field; OCT optical coherence tomography; IOP intraocular pressure; BRVO Branch retinal vein occlusion; CRVO central retinal vein occlusion; CRAO central retinal artery occlusion; BRAO branch retinal artery occlusion; RT retinal tear; SB scleral buckle; PPV pars plana vitrectomy; VH Vitreous hemorrhage; PRP panretinal laser photocoagulation; IVK intravitreal kenalog; VMT vitreomacular traction; MH Macular hole;  NVD neovascularization of the disc; NVE neovascularization elsewhere; AREDS age related eye disease study; ARMD age related macular degeneration; POAG primary open angle glaucoma; EBMD epithelial/anterior basement membrane dystrophy; ACIOL anterior chamber intraocular lens; IOL intraocular lens; PCIOL posterior chamber intraocular lens; Phaco/IOL phacoemulsification with intraocular lens placement; [place] photorefractive keratectomy; LASIK laser assisted in situ keratomileusis; HTN hypertension; DM diabetes mellitus; COPD chronic obstructive pulmonary disease

## 2019-09-28 DIAGNOSIS — G8220 Paraplegia, unspecified: Secondary | ICD-10-CM | POA: Diagnosis not present

## 2019-09-28 DIAGNOSIS — L8995 Pressure ulcer of unspecified site, unstageable: Secondary | ICD-10-CM | POA: Diagnosis not present

## 2019-09-28 DIAGNOSIS — M4800 Spinal stenosis, site unspecified: Secondary | ICD-10-CM | POA: Diagnosis not present

## 2019-09-28 DIAGNOSIS — M4804 Spinal stenosis, thoracic region: Secondary | ICD-10-CM | POA: Diagnosis not present

## 2019-09-28 DIAGNOSIS — G992 Myelopathy in diseases classified elsewhere: Secondary | ICD-10-CM | POA: Diagnosis not present

## 2019-09-29 ENCOUNTER — Other Ambulatory Visit: Payer: Self-pay

## 2019-09-29 ENCOUNTER — Encounter (INDEPENDENT_AMBULATORY_CARE_PROVIDER_SITE_OTHER): Payer: Self-pay | Admitting: Ophthalmology

## 2019-09-29 ENCOUNTER — Ambulatory Visit (INDEPENDENT_AMBULATORY_CARE_PROVIDER_SITE_OTHER): Payer: Medicare Other | Admitting: Ophthalmology

## 2019-09-29 DIAGNOSIS — Z961 Presence of intraocular lens: Secondary | ICD-10-CM

## 2019-09-29 DIAGNOSIS — H43813 Vitreous degeneration, bilateral: Secondary | ICD-10-CM

## 2019-09-29 DIAGNOSIS — H3581 Retinal edema: Secondary | ICD-10-CM | POA: Diagnosis not present

## 2019-09-29 DIAGNOSIS — I10 Essential (primary) hypertension: Secondary | ICD-10-CM

## 2019-09-29 DIAGNOSIS — H35049 Retinal micro-aneurysms, unspecified, unspecified eye: Secondary | ICD-10-CM

## 2019-09-29 DIAGNOSIS — H35033 Hypertensive retinopathy, bilateral: Secondary | ICD-10-CM

## 2019-09-29 DIAGNOSIS — E11319 Type 2 diabetes mellitus with unspecified diabetic retinopathy without macular edema: Secondary | ICD-10-CM

## 2019-09-29 DIAGNOSIS — E113293 Type 2 diabetes mellitus with mild nonproliferative diabetic retinopathy without macular edema, bilateral: Secondary | ICD-10-CM

## 2019-09-29 DIAGNOSIS — H26492 Other secondary cataract, left eye: Secondary | ICD-10-CM

## 2019-10-12 ENCOUNTER — Ambulatory Visit: Payer: Self-pay

## 2019-10-12 ENCOUNTER — Ambulatory Visit: Payer: Medicare Other | Attending: Internal Medicine

## 2019-10-12 DIAGNOSIS — Z23 Encounter for immunization: Secondary | ICD-10-CM

## 2019-10-12 NOTE — Progress Notes (Signed)
   Covid-19 Vaccination Clinic  Name:  Cassandra Stewart    MRN: [id number] DOB: 04-Jun-1950  10/12/2019  [name] was observed post Covid-19 immunization for 30 minutes based on pre-vaccination screening without incident. She was provided with Vaccine Information Sheet and instruction to access the V-Safe system.   Ms. Chamber was instructed to call 911 with any severe reactions post vaccine: Marland Kitchen Difficulty breathing  . Swelling of face and throat  . A fast heartbeat  . A bad rash all over body  . Dizziness and weakness   Immunizations Administered    Name Date Dose VIS Date Route   Pfizer COVID-19 Vaccine 10/12/2019 11:34 AM 0.3 mL 04/21/2018 Intramuscular   Manufacturer: [employer], [employer]   Lot: [id number]   NDC: [address]-8

## 2019-10-29 DIAGNOSIS — G8220 Paraplegia, unspecified: Secondary | ICD-10-CM | POA: Diagnosis not present

## 2019-10-29 DIAGNOSIS — L8995 Pressure ulcer of unspecified site, unstageable: Secondary | ICD-10-CM | POA: Diagnosis not present

## 2019-10-29 DIAGNOSIS — G992 Myelopathy in diseases classified elsewhere: Secondary | ICD-10-CM | POA: Diagnosis not present

## 2019-10-29 DIAGNOSIS — M4800 Spinal stenosis, site unspecified: Secondary | ICD-10-CM | POA: Diagnosis not present

## 2019-10-29 DIAGNOSIS — M4804 Spinal stenosis, thoracic region: Secondary | ICD-10-CM | POA: Diagnosis not present

## 2019-11-03 ENCOUNTER — Encounter: Payer: Medicare Other | Attending: Registered Nurse | Admitting: Physical Medicine and Rehabilitation

## 2019-11-03 ENCOUNTER — Encounter: Payer: Self-pay | Admitting: Physical Medicine and Rehabilitation

## 2019-11-03 ENCOUNTER — Ambulatory Visit: Payer: Medicare Other | Admitting: Gastroenterology

## 2019-11-03 ENCOUNTER — Other Ambulatory Visit: Payer: Self-pay

## 2019-11-03 VITALS — BP 135/81 | HR 66 | Temp 98.8°F | Ht 64.5 in | Wt 224.0 lb

## 2019-11-03 DIAGNOSIS — G8220 Paraplegia, unspecified: Secondary | ICD-10-CM | POA: Insufficient documentation

## 2019-11-03 DIAGNOSIS — Z993 Dependence on wheelchair: Secondary | ICD-10-CM | POA: Diagnosis not present

## 2019-11-03 DIAGNOSIS — R262 Difficulty in walking, not elsewhere classified: Secondary | ICD-10-CM | POA: Diagnosis not present

## 2019-11-03 DIAGNOSIS — M4804 Spinal stenosis, thoracic region: Secondary | ICD-10-CM | POA: Diagnosis not present

## 2019-11-03 DIAGNOSIS — M4714 Other spondylosis with myelopathy, thoracic region: Secondary | ICD-10-CM | POA: Diagnosis not present

## 2019-11-03 DIAGNOSIS — R29898 Other symptoms and signs involving the musculoskeletal system: Secondary | ICD-10-CM | POA: Insufficient documentation

## 2019-11-03 MED ORDER — CLONAZEPAM 0.5 MG PO TABS
0.5000 mg | ORAL_TABLET | Freq: Two times a day (BID) | ORAL | 0 refills | Status: DC | PRN
Start: 2019-11-03 — End: 2020-03-02

## 2019-11-03 NOTE — Progress Notes (Signed)
Subjective:    Patient ID: Cassandra Stewart, female    DOB: 1950/07/29, 69 y.o.   MRN: [id number]  HPI   Pt is a 69 yr old R handed female  With hx of HTN, DM, HLD, Vit B levels are high- with hx of thoracic spondylosis/myelopathy and LE weakness- mainly LLE weak.   Here for f/u.   Never got CT of spine. Never got a call to schedule.   Baclofen- helps a little- taking 5 mg at night- hasn't tried the full tablet so far.  No side effects from it. Has actually calmed down quite a bit with 5 mg Baclofen.   Doesn't really have spasms the rest of the day.   Feels like Tylenol doing its job.       Pain Inventory Average Pain 6 Pain Right Now 6 My pain is intermittent and burning  In the last 24 hours, has pain interfered with the following? General activity 3 Relation with others 3 Enjoyment of life 8 What TIME of day is your pain at its worst? morning  Sleep (in general) Fair  Pain is worse with: unsure Pain improves with: medication Relief from Meds: na  Family History  Problem Relation Age of Onset  . Diabetes Mother   . Heart disease Mother   . Kidney disease Brother        Two brothers on ESRD  . Pancreatic cancer Sister   . Amblyopia Neg Hx   . Blindness Neg Hx   . Cataracts Neg Hx   . Glaucoma Neg Hx   . Macular degeneration Neg Hx   . Retinal detachment Neg Hx   . Strabismus Neg Hx   . Retinitis pigmentosa Neg Hx   . Colon cancer Neg Hx   . Esophageal cancer Neg Hx    Social History   Socioeconomic History  . Marital status: Divorced    Spouse name: Not on file  . Number of children: Not on file  . Years of education: Not on file  . Highest education level: Not on file  Occupational History  . Not on file  Tobacco Use  . Smoking status: Never Smoker  . Smokeless tobacco: Never Used  Vaping Use  . Vaping Use: Never used  Substance and Sexual Activity  . Alcohol use: No  . Drug use: No  . Sexual activity: Not Currently  Other Topics  Concern  . Not on file  Social History Narrative  . Not on file   Social Determinants of Health   Financial Resource Strain:   . Difficulty of Paying Living Expenses: Not on file  Food Insecurity:   . Worried About Programme researcher, broadcasting/film/video in the Last Year: Not on file  . Ran Out of Food in the Last Year: Not on file  Transportation Needs:   . Lack of Transportation (Medical): Not on file  . Lack of Transportation (Non-Medical): Not on file  Physical Activity:   . Days of Exercise per Week: Not on file  . Minutes of Exercise per Session: Not on file  Stress:   . Feeling of Stress : Not on file  Social Connections:   . Frequency of Communication with Friends and Family: Not on file  . Frequency of Social Gatherings with Friends and Family: Not on file  . Attends Religious Services: Not on file  . Active Member of Clubs or Organizations: Not on file  . Attends Banker Meetings: Not on file  . Marital  Status: Not on file   Past Surgical History:  Procedure Laterality Date  . APPENDECTOMY  1969  . BREAST BIOPSY Right   . CATARACT EXTRACTION     May [address]29  . LUMBAR DISC SURGERY    . LUMBAR LAMINECTOMY/DECOMPRESSION MICRODISCECTOMY N/A 07/06/2015   Procedure: Thoracic ten- eleven laminectomy for spinal canal decompression;  Surgeon: Coletta Memos, MD;  Location: [facility];  Service: Orthopedics;  Laterality: N/A;   Past Surgical History:  Procedure Laterality Date  . APPENDECTOMY  1969  . BREAST BIOPSY Right   . CATARACT EXTRACTION     May 79892  . LUMBAR DISC SURGERY    . LUMBAR LAMINECTOMY/DECOMPRESSION MICRODISCECTOMY N/A 07/06/2015   Procedure: Thoracic ten- eleven laminectomy for spinal canal decompression;  Surgeon: Coletta Memos, MD;  Location: [facility];  Service: Orthopedics;  Laterality: N/A;   Past Medical History:  Diagnosis Date  . Elevated LFTs   . Fatty liver    on [place].   Marland Kitchen Hyperlipemia   . Hypertension   . Uncontrolled type 2 diabetes with  neuropathy ([facility])   . Wheelchair bound    paraplegia of Leg - Left   BP 135/81   Pulse 66   Temp 98.8 F (37.1 C)   Ht 5' 4.5" (1.638 m)   Wt 224 lb (101.6 kg)   SpO2 97%   BMI 37.86 kg/m   Opioid Risk Score:   Fall Risk Score:  `1  Depression screen PHQ 2/9  Depression screen [facility] 2/9 11/03/2019 08/31/2019 08/04/2019 08/25/2018 11/05/2017 10/21/2017 09/09/2017  Decreased Interest 0 0 0 0 0 0 0  Down, Depressed, Hopeless 0 0 0 0 3 0 0  PHQ - 2 Score 0 0 0 0 3 0 0    Review of Systems  Constitutional: Negative.   HENT: Negative.   Eyes: Negative.   Respiratory: Negative.   Cardiovascular: Negative.   Gastrointestinal: Negative.   Endocrine: Negative.   Genitourinary: Negative.   Musculoskeletal: Negative.   Skin: Negative.   Allergic/Immunologic: Negative.   Neurological: Negative.   Hematological: Negative.   Psychiatric/Behavioral: Negative.   All other systems reviewed and are negative.      Objective:   Physical Exam Awake, alert, appropriate, in power w/c, dressed beautifully NAD MS: RLE- HF 4-/5, KE and KF 4/5, DF and PF 4+/5 LLE- HF 2-/5, KE/KF 2/5, DF and PF 4-/5  Neuro MAS of 1+ in LEs- no clonus B/L     Assessment & Plan:    1. Continue tylenol for back pain  2. Continue Baclofen at current dose 510 mg nightly for spasms of legs- can take up to 1 full tab nightly for spasms, whenever might be needed.   3. Didn't get CT- wasn't called- will try and get MRI- will give anti-anxiety medicine, doesn't drive, so can take for MRI.  4. Will send in Clonazepam 0.5 mg 1 hr before MRI- can take another 1 at time of MRI if not enough- no driving afterwards for 1 day.   5.  F/U in 3 months- when get results, will give her a call.   I spent a total of 25 minutes on visit -as detailed above.

## 2019-11-03 NOTE — Progress Notes (Deleted)
HPI :    Past Medical History:  Diagnosis Date   Elevated LFTs    Fatty liver    on [place].    Hyperlipemia    Hypertension    Uncontrolled type 2 diabetes with neuropathy [facility])    Wheelchair bound    paraplegia of Leg - Left     Past Surgical History:  Procedure Laterality Date   APPENDECTOMY  1969   BREAST BIOPSY Right    CATARACT EXTRACTION     May 21016   LUMBAR DISC SURGERY     LUMBAR LAMINECTOMY/DECOMPRESSION MICRODISCECTOMY N/A 07/06/2015   Procedure: Thoracic ten- eleven laminectomy for spinal canal decompression;  Surgeon: Ashok Pall, MD;  Location: [place] [facility];  Service: Orthopedics;  Laterality: N/A;   Family History  Problem Relation Age of Onset   Diabetes Mother    Heart disease Mother    Kidney disease Brother        Two brothers on ESRD   Pancreatic cancer Sister    Amblyopia Neg Hx    Blindness Neg Hx    Cataracts Neg Hx    Glaucoma Neg Hx    Macular degeneration Neg Hx    Retinal detachment Neg Hx    Strabismus Neg Hx    Retinitis pigmentosa Neg Hx    Colon cancer Neg Hx    Esophageal cancer Neg Hx    Social History   Tobacco Use   Smoking status: Never Smoker   Smokeless tobacco: Never Used  Vaping Use   Vaping Use: Never used  Substance Use Topics   Alcohol use: No   Drug use: No   Current Outpatient Medications  Medication Sig Dispense Refill   ACCU-CHEK SOFTCLIX LANCETS lancets Test 3 times daily. Pt uses accu-chek softclix lancing device 200 each 3   acetaminophen (ACETAMINOPHEN 8 HOUR) 650 MG CR tablet Take 650 mg by mouth every 8 (eight) hours as needed for pain.     albuterol (PROVENTIL HFA) 108 (90 Base) MCG/ACT inhaler Inhale 1-2 puffs into the lungs every 4 (four) hours as needed for wheezing or shortness of breath.      amLODipine (NORVASC) 10 MG tablet TAKE 1 TABLET(10 MG) BY MOUTH EVERY MORNING 90 tablet 0   aspirin EC 81 MG tablet Take 81 mg by mouth every evening.     baclofen  (LIORESAL) 10 MG tablet Take 0.5-1 tablets (5-10 mg total) by mouth at bedtime. For muscle spasms- at night. 30 each 5   Blood Glucose Monitoring Suppl (ACCU-CHEK AVIVA PLUS) w/Device KIT Use as advised 1 kit 1   Cholecalciferol (VITAMIN D PO) Take 1,000 mg by mouth daily.      Cyanocobalamin (VITAMIN B 12 PO) Take 5,000 mg by mouth daily.     dicyclomine (BENTYL) 10 MG capsule Take 1 capsule (10 mg total) by mouth every 8 (eight) hours as needed for spasms. 90 capsule 1   Dulaglutide (TRULICITY) 3 MA/0.0KH SOPN Inject 3 mg into the skin once a week. 12 pen 11   gabapentin (NEURONTIN) 300 MG capsule TAKE 1 CAPSULE(300 MG) BY MOUTH THREE TIMES DAILY 270 capsule 1   glucose blood (ACCU-CHEK AVIVA PLUS) test strip Use as instructed to check blood sugar 4 times a day 400 each 12   hydrochlorothiazide (MICROZIDE) 12.5 MG capsule TAKE 1 CAPSULE(12.5 MG) BY MOUTH DAILY 90 capsule 1   insulin lispro (HUMALOG) 100 UNIT/ML KwikPen INJECT 12 TO 22 UNITS UNDER THE SKIN THREE TIMES DAILY 63 mL 1  Insulin Pen Needle (CAREFINE PEN NEEDLES) 32G X 4 MM MISC Use 4x a day 300 each 3   lovastatin (MEVACOR) 40 MG tablet TAKE 1 TABLET(40 MG) BY MOUTH AT BEDTIME 90 tablet 1   metFORMIN (GLUCOPHAGE-XR) 500 MG 24 hr tablet Take 1 tablet (500 mg total) by mouth 2 (two) times daily. 180 tablet 3   nitroGLYCERIN (NITROSTAT) 0.4 MG SL tablet Place 1 tablet (0.4 mg total) under the tongue every 5 (five) minutes as needed for chest pain. 25 tablet 3   polyethylene glycol (MIRALAX / GLYCOLAX) 17 g packet Take 17 g by mouth as needed.     prednisoLONE acetate (PRED FORTE) 1 % ophthalmic suspension Place 1 drop into the left eye 4 (four) times daily. 10 mL 0   TOUJEO SOLOSTAR 300 UNIT/ML SOPN Inject 35 Units into the skin at bedtime. 12 pen 3   No current facility-administered medications for this visit.   Allergies  Allergen Reactions   Other     PT PREFERS TO NOT HAVE ANY NARCOTIC MEDICATIONS    Chlorhexidine Rash    Burning after using CHG wipes-used for surgery   Oxycodone Hcl     Other reaction(s): Hallucination Marked hallucinations and palpitations following dose of 51m on 04/30/2015      Review of Systems: All systems reviewed and negative except where noted in HPI.    No results found.  Physical Exam: There were no vitals taken for this visit. Constitutional: Pleasant,well-developed, ***female in no acute distress. HEENT: Normocephalic and atraumatic. Conjunctivae are normal. No scleral icterus. Neck supple.  Cardiovascular: Normal rate, regular rhythm.  Pulmonary/chest: Effort normal and breath sounds normal. No wheezing, rales or rhonchi. Abdominal: Soft, nondistended, nontender. Bowel sounds active throughout. There are no masses palpable. No hepatomegaly. Extremities: no edema Lymphadenopathy: No cervical adenopathy noted. Neurological: Alert and oriented to person place and time. Skin: Skin is warm and dry. No rashes noted. Psychiatric: Normal mood and affect. Behavior is normal.   ASSESSMENT AND PLAN:  HHarland DingwallL, NP-C

## 2019-11-03 NOTE — Patient Instructions (Signed)
1. Continue tylenol for back pain  2. Continue Baclofen at current dose 510 mg nightly for spasms of legs- can take up to 1 full tab nightly for spasms, whenever might be needed.   3. Didn't get CT- wasn't called- will try and get MRI- will give anti-anxiety medicine, doesn't drive, so can take for MRI.  4. Will send in Clonazepam 0.5 mg 1 hr before MRI- can take another 1 at time of MRI if not enough- no driving afterwards for 1 day.   5.  F/U in 3 months- when get results, will give her a call.

## 2019-12-02 ENCOUNTER — Ambulatory Visit (INDEPENDENT_AMBULATORY_CARE_PROVIDER_SITE_OTHER): Payer: Medicare Other | Admitting: Internal Medicine

## 2019-12-02 ENCOUNTER — Encounter: Payer: Self-pay | Admitting: Internal Medicine

## 2019-12-02 ENCOUNTER — Other Ambulatory Visit: Payer: Medicare Other

## 2019-12-02 ENCOUNTER — Other Ambulatory Visit: Payer: Self-pay

## 2019-12-02 VITALS — BP 120/62 | HR 74 | Wt 182.2 lb

## 2019-12-02 DIAGNOSIS — E1165 Type 2 diabetes mellitus with hyperglycemia: Secondary | ICD-10-CM | POA: Diagnosis not present

## 2019-12-02 DIAGNOSIS — E1140 Type 2 diabetes mellitus with diabetic neuropathy, unspecified: Secondary | ICD-10-CM

## 2019-12-02 DIAGNOSIS — E782 Mixed hyperlipidemia: Secondary | ICD-10-CM | POA: Diagnosis not present

## 2019-12-02 DIAGNOSIS — E538 Deficiency of other specified B group vitamins: Secondary | ICD-10-CM | POA: Diagnosis not present

## 2019-12-02 DIAGNOSIS — IMO0002 Reserved for concepts with insufficient information to code with codable children: Secondary | ICD-10-CM

## 2019-12-02 LAB — POCT GLYCOSYLATED HEMOGLOBIN (HGB A1C): Hemoglobin A1C: 7.4 % — AB (ref 4.0–5.6)

## 2019-12-02 MED ORDER — TOUJEO SOLOSTAR 300 UNIT/ML ~~LOC~~ SOPN: 28.0000 [IU] | PEN_INJECTOR | Freq: Every day | SUBCUTANEOUS | 3 refills | Status: DC

## 2019-12-02 MED ORDER — INSULIN LISPRO (1 UNIT DIAL) 100 UNIT/ML (KWIKPEN): PEN_INJECTOR | SUBCUTANEOUS | 3 refills | Status: DC

## 2019-12-02 MED ORDER — TRULICITY 3 MG/0.5ML ~~LOC~~ SOAJ: 3.0000 mg | SUBCUTANEOUS | 3 refills | Status: DC

## 2019-12-02 MED ORDER — METFORMIN HCL ER 500 MG PO TB24: 500.0000 mg | ORAL_TABLET | Freq: Two times a day (BID) | ORAL | 3 refills | Status: DC

## 2019-12-02 NOTE — Patient Instructions (Addendum)
Please continue: - Metformin ER 500 mg 1-2x a day with meals. - Trulicity 3 mg weekly - Humalog 7-10 units before the meals  Please decrease: - Toujeo 28 units at night  Please continue B12 1000 mcg every other day.  Please return in 4 months with your sugar log.

## 2019-12-02 NOTE — Progress Notes (Signed)
Patient ID: Cassandra Stewart, female   DOB: 09-04-1950, 69 y.o.   MRN: [id number]   This visit occurred during the SARS-CoV-2 public health emergency.  Safety protocols were in place, including screening questions prior to the visit, additional usage of staff PPE, and extensive cleaning of exam room while observing appropriate contact time as indicated for disinfecting solutions.   HPI: Cassandra Stewart is a 69 y.o.-year-old female, returning for follow-up for DM2, dx in 1992, insulin-dependent since 2006, uncontrolled, with complications (CKD stage 3, PN, DR).  Last visit 4 months ago.  At today's visit, she tells me that she started on the diet and lost a significant amount of weight since last visit.  Per my scale, this is actually 40 pounds!  Sugars are much better.  Reviewed HbA1c levels: Lab Results  Component Value Date   HGBA1C 9.3 (A) 07/29/2019   HGBA1C 9.6 (A) 03/30/2019   HGBA1C 9.8 (A) 12/28/2018   HGBA1C 10.7 (A) 07/13/2018   HGBA1C 6.8 (A) 03/16/2018   HGBA1C 6.1 (A) 11/12/2017   HGBA1C 9.0 07/11/2017   HGBA1C 9.0 04/14/2017   HGBA1C 8.7% 10/24/2016   HGBA1C 9.6% 07/25/2016   HGBA1C 10.5% 03/04/2016   HGBA1C 8.1% 10/25/2015   HGBA1C 9.7 (H) 07/04/2015  03/16/2018: HbA1c calculated from fructosamine 6.28%  Pt was on a regimen of: - Metformin 500 mg 1x a day with dinner.  She had diarrhea with a higher dose. - Toujeo 45 units in am - Humalog 18 units 2-3x a day, before meals Tried: Actos, Lantus.  Now on: - Metformin ER 500 mg 1-2x a day with meals. - Trulicity 1.5 >> 3 mg weekly - Toujeo 35 units at night - Humalog 7 to 10 units before meals  Is checking sugars once a day-cannot check more due to carpal tunnel syndrome with numbness and pain in fingers: - am: 90-103 >> 90-100  >> 117-194, but while on steroids: 207-383 >> 67, 96-130, 143 (icecream) - 2h after b'fast: n/c - before lunch: 80-110 >> n/c >> 60s, 70 >> n/c - 2h after lunch: n/c - before dinner:  80-110 >> 90-100 >> 188 >> n/c - 2h after dinner: n/c - bedtime: n/c >> <130 - nighttime: n/c Lowest sugar was 60s >> 117 >> 67; she has hypoglycemia awareness in the 80s. Highest sugar was 188 >> 383 >> 143.  Glucometer: Freestyle  Pt's meals are: - Breakfast/brunch: egg, bacon, toast >> no b'fast now - Lunch: snack mostly (no Humalog) >> Meals on Wheels - Dinner: chicken/fish + vegetables >> moved before 7 pm - Snacks: Belvita cracker if hungry  -+ Stage III CKD; last BUN/creatinine:  Lab Results  Component Value Date   BUN 18 08/31/2019   BUN 18 03/24/2019   CREATININE 1.09 (H) 08/31/2019   CREATININE 1.11 (H) 03/24/2019   Lab Results  Component Value Date   GFRAA 59 (L) 08/31/2019   GFRAA 58 (L) 03/24/2019   GFRAA 59 (L) 03/12/2019   GFRAA 55 (L) 03/08/2019   GFRAA 58 (L) 11/30/2018  Previously on lisinopril 10, but stopped due to hyperkalemia.  -+ HL; last set of lipids: Lab Results  Component Value Date   CHOL 152 08/31/2019   HDL 64 08/31/2019   LDLCALC 67 08/31/2019   TRIG 121 08/31/2019   CHOLHDL 2.4 08/31/2019  On lovastatin 40. - last eye exam was in 2021: + DR reportedly.  She has history of cataract surgery OU. -+ Numbness and tingling in her fingers-on Neurontin On ASA 81.  Low vitamin B12:  Reviewed B12 levels: Lab Results  Component Value Date   VITAMINB12 >1526 (H) 07/29/2019   VITAMINB12 >1500 (H) 03/30/2019   VITAMINB12 >1500 (H) 12/28/2018   VITAMINB12 261 03/16/2018   VITAMINB12 300 04/14/2017   VITAMINB12 478 10/25/2015  05/01/2016: Vit B12 248.  We initially started 5000 mcg B12 daily, which was then decreased to 2500 mcg daily and, then to 1000 mcg daily.  At last visit, we decreased this to every other day.  She continues to be in a wheelchair as her left leg is very weak.  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  She had surgery in 2016 but strength did not improve.  She has an extensive FH of heart disease -  women in her family. Daughter died after her heart stopped.  ROS: Constitutional: no weight gain/no weight loss, no fatigue, no subjective hyperthermia, no subjective hypothermia Eyes: no blurry vision, no xerophthalmia ENT: no sore throat, no nodules palpated in neck, no dysphagia, no odynophagia, no hoarseness Cardiovascular: no CP/no SOB/no palpitations/no leg swelling Respiratory: no cough/no SOB/no wheezing Gastrointestinal: no N/no V/no D/no C/no acid reflux Musculoskeletal: no muscle aches/no joint aches Skin: no rashes, no hair loss Neurological: no tremors/+ numbness and tingling in fingers/no dizziness  I reviewed pt's medications, allergies, PMH, social hx, family hx, and changes were documented in the history of present illness. Otherwise, unchanged from my initial visit note.  Past Medical History:  Diagnosis Date  . Elevated LFTs   . Fatty liver    on [place].   Marland Kitchen Hyperlipemia   . Hypertension   . Uncontrolled type 2 diabetes with neuropathy ([place])   . Wheelchair bound    paraplegia of Leg - Left   Past Surgical History:  Procedure Laterality Date  . APPENDECTOMY  1969  . BREAST BIOPSY Right   . CATARACT EXTRACTION     May 21016  . LUMBAR DISC SURGERY    . LUMBAR LAMINECTOMY/DECOMPRESSION MICRODISCECTOMY N/A 07/06/2015   Procedure: Thoracic ten- eleven laminectomy for spinal canal decompression;  Surgeon: Ashok Pall, MD;  Location: [facility];  Service: Orthopedics;  Laterality: N/A;   Social History   Social History  . Marital status: Divorced    Spouse name: N/A  . Number of children: 0   Occupational History  . None   Social History Main Topics  . Smoking status: Never Smoker  . Smokeless tobacco: Never Used  . Alcohol use No  . Drug use: No   Current Outpatient Medications on File Prior to Visit  Medication Sig Dispense Refill  . ACCU-CHEK SOFTCLIX LANCETS lancets Test 3 times daily. Pt uses accu-chek softclix lancing device 200 each 3  .  acetaminophen (ACETAMINOPHEN 8 HOUR) 650 MG CR tablet Take 650 mg by mouth every 8 (eight) hours as needed for pain.    Marland Kitchen albuterol (PROVENTIL HFA) 108 (90 Base) MCG/ACT inhaler Inhale 1-2 puffs into the lungs every 4 (four) hours as needed for wheezing or shortness of breath.     Marland Kitchen amLODipine (NORVASC) 10 MG tablet TAKE 1 TABLET(10 MG) BY MOUTH EVERY MORNING 90 tablet 0  . aspirin EC 81 MG tablet Take 81 mg by mouth every evening.    . baclofen (LIORESAL) 10 MG tablet Take 0.5-1 tablets (5-10 mg total) by mouth at bedtime. For muscle spasms- at night. 30 each 5  . Blood Glucose Monitoring Suppl (ACCU-CHEK AVIVA PLUS) w/Device KIT Use as advised 1 kit 1  .  Cholecalciferol (VITAMIN D PO) Take 1,000 mg by mouth daily.     . clonazePAM (KLONOPIN) 0.5 MG tablet Take 1 tablet (0.5 mg total) by mouth 2 (two) times daily as needed for up to 2 doses for anxiety (for MRI- can take 1 1 hr prior- can take 1 more if needed right before scan, if not enough). 2 tablet 0  . Cyanocobalamin (VITAMIN B 12 PO) Take 5,000 mg by mouth daily.    Marland Kitchen dicyclomine (BENTYL) 10 MG capsule Take 1 capsule (10 mg total) by mouth every 8 (eight) hours as needed for spasms. 90 capsule 1  . Dulaglutide (TRULICITY) 3 MV/6.7MC SOPN Inject 3 mg into the skin once a week. 12 pen 11  . gabapentin (NEURONTIN) 300 MG capsule TAKE 1 CAPSULE(300 MG) BY MOUTH THREE TIMES DAILY 270 capsule 1  . glucose blood (ACCU-CHEK AVIVA PLUS) test strip Use as instructed to check blood sugar 4 times a day 400 each 12  . hydrochlorothiazide (MICROZIDE) 12.5 MG capsule TAKE 1 CAPSULE(12.5 MG) BY MOUTH DAILY 90 capsule 1  . insulin lispro (HUMALOG) 100 UNIT/ML KwikPen INJECT 12 TO 22 UNITS UNDER THE SKIN THREE TIMES DAILY 63 mL 1  . Insulin Pen Needle (CAREFINE PEN NEEDLES) 32G X 4 MM MISC Use 4x a day 300 each 3  . lovastatin (MEVACOR) 40 MG tablet TAKE 1 TABLET(40 MG) BY MOUTH AT BEDTIME 90 tablet 1  . metFORMIN (GLUCOPHAGE-XR) 500 MG 24 hr tablet Take 1  tablet (500 mg total) by mouth 2 (two) times daily. 180 tablet 3  . nitroGLYCERIN (NITROSTAT) 0.4 MG SL tablet Place 1 tablet (0.4 mg total) under the tongue every 5 (five) minutes as needed for chest pain. 25 tablet 3  . polyethylene glycol (MIRALAX / GLYCOLAX) 17 g packet Take 17 g by mouth as needed.    . prednisoLONE acetate (PRED FORTE) 1 % ophthalmic suspension Place 1 drop into the left eye 4 (four) times daily. 10 mL 0  . TOUJEO SOLOSTAR 300 UNIT/ML SOPN Inject 35 Units into the skin at bedtime. 12 pen 3   No current facility-administered medications on file prior to visit.   Allergies  Allergen Reactions  . Other     PT PREFERS TO NOT HAVE ANY NARCOTIC MEDICATIONS  . Chlorhexidine Rash    Burning after using CHG wipes-used for surgery  . Oxycodone Hcl     Other reaction(s): Hallucination Marked hallucinations and palpitations following dose of 13m on 04/30/2015    Family History  Problem Relation Age of Onset  . Diabetes Mother   . Heart disease Mother   . Kidney disease Brother        Two brothers on ESRD  . Pancreatic cancer Sister   . Amblyopia Neg Hx   . Blindness Neg Hx   . Cataracts Neg Hx   . Glaucoma Neg Hx   . Macular degeneration Neg Hx   . Retinal detachment Neg Hx   . Strabismus Neg Hx   . Retinitis pigmentosa Neg Hx   . Colon cancer Neg Hx   . Esophageal cancer Neg Hx    Pt has FH of DM in M, MGM, PGM, M aunt, uncles.  PE: BP 120/62   Pulse 74   Wt 182 lb 3.2 oz (82.6 kg)   SpO2 97%   BMI 30.79 kg/m  Wt Readings from Last 3 Encounters:  12/02/19 182 lb 3.2 oz (82.6 kg)  11/03/19 224 lb (101.6 kg)  08/31/19 224 lb (101.6  kg)   Constitutional: overweight, in NAD, in wheelchair Eyes: PERRLA, EOMI, no exophthalmos ENT: moist mucous membranes, no thyromegaly, no cervical lymphadenopathy Cardiovascular: RRR, No MRG Respiratory: CTA B Gastrointestinal: abdomen soft, NT, ND, BS+ Musculoskeletal: no deformities, strength intact in all 4 Skin:  moist, warm, no rashes Neurological: no tremor with outstretched hands, DTR normal in all 4  ASSESSMENT: 1. DM2, insulin-dependent, uncontrolled, with complications - CKD stage 3 - PN - DR  - Of note, we tried to prescribe the Freestyle libre CGM for her but this was not covered by the insurance  2. Low B12  3. HL  PLAN:  1. Patient with longstanding, type 2 diabetes, on oral antidiabetic regimen with Metformin, also, basal-bolus insulin regimen, and weekly GLP-1 receptor agonist.  At last visit, HbA1c was only slightly better at 9.3%.  At that time, she was telling me that the sugars actually improved after our previous visit but they started to increase again after steroid injections.  She was only checking in the morning and I advised her to also check later in the day.  She was interested in a CGM and I advised her that she needed to check 4 times a day before this can be approved.  However, since then, Medicare eliminated this requirement.  At this visit, we will need to send a prescription for the freestyle libre CGM to her pharmacy. - she lost 40 lbs in last month!!!! - intentional - Along with her weight loss, her sugars improved significantly.  They are now most at goal, with only 1 exception with dietary indiscretion the night before.  She even had some low blood sugars in the 60s in the morning.  Therefore, will reduce her Toujeo dose.  At next visit, will work on reducing the Humalog and if she continues to do this well with the diet, she may even come off Humalog.  She is excited about the possibility. - I suggested to:  Patient Instructions  Please continue: - Metformin ER 500 mg 1-2x a day with meals. - Trulicity 3 mg weekly - Humalog 7-10 units before the meals  Please decrease: - Toujeo 28 units at night  Please continue B12 1000 mcg every other day.  Please return in 4 months with your sugar log.   - we checked her HbA1c: 7.4% (much better) - advised to check  sugars at different times of the day - 4x a day, rotating check times - advised for yearly eye exams >> she is UTD - return to clinic in 4 months  2. Low B12 -She has a history of low vitamin B12 and we started supplementation with p.o. B12 -At last visit, B12 level was still high -We decreased the dose to every other day -We will need to repeat a B12 level - at next OV  3. HL -Reviewed latest lipid panel from 08/2019: Excellent lipids: Lab Results  Component Value Date   CHOL 152 08/31/2019   HDL 64 08/31/2019   LDLCALC 67 08/31/2019   TRIG 121 08/31/2019   CHOLHDL 2.4 08/31/2019  -She continues Mevacor 40 without side effects  Philemon Kingdom, MD PhD [facility]

## 2019-12-07 ENCOUNTER — Ambulatory Visit
Admission: RE | Admit: 2019-12-07 | Discharge: 2019-12-07 | Disposition: A | Discharge status: Home / Self Care | Payer: Medicare Other | Source: Ambulatory Visit | Attending: Physical Medicine and Rehabilitation | Admitting: Physical Medicine and Rehabilitation

## 2019-12-07 DIAGNOSIS — R531 Weakness: Secondary | ICD-10-CM | POA: Diagnosis not present

## 2019-12-07 DIAGNOSIS — M4804 Spinal stenosis, thoracic region: Secondary | ICD-10-CM

## 2019-12-07 DIAGNOSIS — R29898 Other symptoms and signs involving the musculoskeletal system: Secondary | ICD-10-CM

## 2019-12-07 DIAGNOSIS — Z9889 Other specified postprocedural states: Secondary | ICD-10-CM | POA: Diagnosis not present

## 2019-12-07 DIAGNOSIS — M5134 Other intervertebral disc degeneration, thoracic region: Secondary | ICD-10-CM | POA: Diagnosis not present

## 2019-12-09 ENCOUNTER — Encounter: Payer: Self-pay | Admitting: Family Medicine

## 2019-12-09 ENCOUNTER — Telehealth: Payer: Self-pay | Admitting: Family Medicine

## 2019-12-09 NOTE — Telephone Encounter (Signed)
Sending to Lafonda Mosses to handle. Thanks.

## 2019-12-09 NOTE — Telephone Encounter (Signed)
I need to review the letter and see which criteria she meets to be able to write the letter.

## 2019-12-09 NOTE — Telephone Encounter (Signed)
Pt called and states that she has received a jury summons for [employer]. She states due to her being disabled, she would like a letter from [employer] excusing from jury duty. She states she must rely on [employer] for transportation and they do not travel to [employer]. Please advise pt at [phone].

## 2019-12-10 ENCOUNTER — Telehealth: Payer: Self-pay

## 2019-12-10 NOTE — Telephone Encounter (Signed)
Katie from [facility] called on behalf of the pt. She is requesting a battery for her wheelchair, and a continuous glucose monitor. She said the easiest way to do this is for the doctor to send a prescription to a company such as [facility] there phone number is [phone] and fax number is [phone].

## 2019-12-10 NOTE — Telephone Encounter (Signed)
Please help her take care of this.

## 2019-12-10 NOTE — Telephone Encounter (Signed)
Sent rx to [facility] for battery. Pt was advised to contact endo for diabetes supplies

## 2019-12-13 NOTE — Telephone Encounter (Signed)
Pt was called and she asked that letter be mailed to her. Address was verified and sent.

## 2019-12-13 NOTE — Telephone Encounter (Signed)
Letter completed and Olegario Messier will get to patient.

## 2019-12-17 ENCOUNTER — Other Ambulatory Visit: Payer: Self-pay | Admitting: Family Medicine

## 2019-12-17 DIAGNOSIS — I10 Essential (primary) hypertension: Secondary | ICD-10-CM

## 2019-12-22 ENCOUNTER — Other Ambulatory Visit: Payer: Self-pay | Admitting: Internal Medicine

## 2019-12-22 ENCOUNTER — Telehealth: Payer: Self-pay | Admitting: Internal Medicine

## 2019-12-22 MED ORDER — FREESTYLE LIBRE 14 DAY READER DEVI: 1.0000 | Freq: Once | 1 refills | Status: AC

## 2019-12-22 MED ORDER — FREESTYLE LIBRE 14 DAY SENSOR MISC: 1.0000 | 11 refills | Status: DC

## 2019-12-22 NOTE — Telephone Encounter (Signed)
Patient called and would like the Free [employer] sensors, transmitter, receiver to be sent to [employer] on [address]

## 2019-12-22 NOTE — Telephone Encounter (Signed)
Last 12/02/2019  Please advise below message.

## 2019-12-22 NOTE — Telephone Encounter (Signed)
I sent them. Ty! C

## 2020-01-04 ENCOUNTER — Ambulatory Visit: Payer: Medicare Other | Admitting: Internal Medicine

## 2020-01-13 ENCOUNTER — Telehealth: Payer: Self-pay | Admitting: Family Medicine

## 2020-01-13 NOTE — Telephone Encounter (Signed)
Form was dropped off that needs to be completed by you. Form place in your folder upfront. Please call pt when form is ready.

## 2020-01-31 ENCOUNTER — Telehealth: Payer: Self-pay | Admitting: Family Medicine

## 2020-01-31 NOTE — Telephone Encounter (Signed)
Form was dropped off. Number 2 under general questions needs to be answered. Sending back.

## 2020-02-02 ENCOUNTER — Encounter: Payer: Self-pay | Admitting: Physical Medicine and Rehabilitation

## 2020-02-02 ENCOUNTER — Other Ambulatory Visit: Payer: Self-pay

## 2020-02-02 ENCOUNTER — Encounter: Payer: Medicare Other | Attending: Registered Nurse | Admitting: Physical Medicine and Rehabilitation

## 2020-02-02 VITALS — BP 140/83 | HR 80 | Temp 98.3°F | Ht 64.5 in | Wt 182.0 lb

## 2020-02-02 DIAGNOSIS — E118 Type 2 diabetes mellitus with unspecified complications: Secondary | ICD-10-CM | POA: Diagnosis not present

## 2020-02-02 DIAGNOSIS — Z794 Long term (current) use of insulin: Secondary | ICD-10-CM | POA: Diagnosis not present

## 2020-02-02 DIAGNOSIS — Z993 Dependence on wheelchair: Secondary | ICD-10-CM

## 2020-02-02 DIAGNOSIS — G894 Chronic pain syndrome: Secondary | ICD-10-CM

## 2020-02-02 DIAGNOSIS — R262 Difficulty in walking, not elsewhere classified: Secondary | ICD-10-CM | POA: Diagnosis not present

## 2020-02-02 DIAGNOSIS — M4714 Other spondylosis with myelopathy, thoracic region: Secondary | ICD-10-CM | POA: Diagnosis not present

## 2020-02-02 MED ORDER — TRAMADOL HCL 50 MG PO TABS: 50.0000 mg | ORAL_TABLET | Freq: Four times a day (QID) | ORAL | 0 refills | Status: DC | PRN

## 2020-02-02 MED ORDER — PREGABALIN 50 MG PO CAPS: 50.0000 mg | ORAL_CAPSULE | Freq: Two times a day (BID) | ORAL | 0 refills | Status: DC

## 2020-02-02 NOTE — Patient Instructions (Signed)
  Pt is a 69 yr old R handed female With hx of HTN, DM, HLD, Vit B levels are high- with hx of thoracic spondylosis/myelopathy and LE weakness- mainly LLE weak.   1. Tramadol 50 mg  50 mg every 6 hours AS NEEDED- side effects constipation, drowsiness I suggest, taking 1/2 tabs  Initially then can increase as needed.   2. Lyrica 50 mg 2x/day - try for 7 days- if tolerated, we can increase when you f/u on phone with me.  Then can increase to 100 mg 2x/day if possible.   3. Continue gabapentin only first day of Lyrica.  Then stop!!!  4. W/c rep- for [employer]- will call to see if can get new w/c- Has [employer].   5. F/U in 6 weeks.

## 2020-02-02 NOTE — Progress Notes (Signed)
Subjective:    Patient ID: Cassandra Stewart, female    DOB: 11/29/1950, 69 y.o.   MRN: [id number]  HPI    Pt is a 69 yr old R handed female With hx of HTN, DM, HLD, Vit B levels are high- with hx of thoracic spondylosis/myelopathy and LE weakness- mainly LLE weak.    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  Everything is on L side that's acting worse.   Doesn't want opiates, but is in so much pain.   Aching, throbbing pain.  Also has some nerve pain meds.    Thinks power w/c is the cause of back pain.  When in manual w/c in house- doesn't hurt as much.   Power w/c is 69 years old. Got w/c from [facility]- they are [employer] now- won't work with them.  They run her BP up.  Working with [employer] now. Lives in [place].     Pain Inventory Average Pain 5 Pain Right Now 5 My pain is intermittent, dull and aching  LOCATION OF PAIN  Left shoulder, mid-back   BOWEL Number of stools per week: 1 Oral laxative use YES Type of laxative - Miralax Enema or suppository use No  History of colostomy No  Incontinent No   BLADDER Normal In and out cath, frequency - N/A Able to self cath N/A Bladder incontinence No  Frequent urination No  Leakage with coughing No  Difficulty starting stream No  Incomplete bladder emptying No    Mobility how many minutes can you walk? NONE ability to climb steps?  no do you drive?  no transfers alone Do you have any goals in this area?  yes  Function retired  Neuro/Psych weakness numbness trouble walking  Prior Studies Any changes since last visit?  yes CT/MRI MRI ordered by Dr. Berline Chough  Physicians involved in your care Any changes since last visit?  no   Family History  Problem Relation Age of Onset  . Diabetes Mother   . Heart disease Mother   . Kidney disease Brother        Two brothers on ESRD  . Pancreatic cancer Sister   . Amblyopia Neg Hx   . Blindness Neg  Hx   . Cataracts Neg Hx   . Glaucoma Neg Hx   . Macular degeneration Neg Hx   . Retinal detachment Neg Hx   . Strabismus Neg Hx   . Retinitis pigmentosa Neg Hx   . Colon cancer Neg Hx   . Esophageal cancer Neg Hx    Social History   Socioeconomic History  . Marital status: Divorced    Spouse name: Not on file  . Number of children: Not on file  . Years of education: Not on file  . Highest education level: Not on file  Occupational History  . Not on file  Tobacco Use  . Smoking status: Never Smoker  . Smokeless tobacco: Never Used  Vaping Use  . Vaping Use: Never used  Substance and Sexual Activity  . Alcohol use: No  . Drug use: No  . Sexual activity: Not Currently  Other Topics Concern  . Not on file  Social History Narrative  . Not on file   Social Determinants of Health   Financial Resource Strain:   . Difficulty of Paying Living Expenses: Not on file  Food Insecurity:   . Worried About Programme researcher, broadcasting/film/video in the Last Year: Not on file  .  Ran Out of Food in the Last Year: Not on file  Transportation Needs:   . Lack of Transportation (Medical): Not on file  . Lack of Transportation (Non-Medical): Not on file  Physical Activity:   . Days of Exercise per Week: Not on file  . Minutes of Exercise per Session: Not on file  Stress:   . Feeling of Stress : Not on file  Social Connections:   . Frequency of Communication with Friends and Family: Not on file  . Frequency of Social Gatherings with Friends and Family: Not on file  . Attends Religious Services: Not on file  . Active Member of Clubs or Organizations: Not on file  . Attends Banker Meetings: Not on file  . Marital Status: Not on file   Past Surgical History:  Procedure Laterality Date  . APPENDECTOMY  1969  . BREAST BIOPSY Right   . CATARACT EXTRACTION     May 82993  . LUMBAR DISC SURGERY    . LUMBAR LAMINECTOMY/DECOMPRESSION MICRODISCECTOMY N/A 07/06/2015   Procedure: Thoracic ten-  eleven laminectomy for spinal canal decompression;  Surgeon: Coletta Memos, MD;  Location: [facility];  Service: Orthopedics;  Laterality: N/A;   Past Medical History:  Diagnosis Date  . Elevated LFTs   . Fatty liver    on [place].   Marland Kitchen Hyperlipemia   . Hypertension   . Uncontrolled type 2 diabetes with neuropathy ([facility])   . Wheelchair bound    paraplegia of Leg - Left   There were no vitals taken for this visit.  Opioid Risk Score:   Fall Risk Score:  `1  Depression screen PHQ 2/9  Depression screen [facility] 2/9 11/03/2019 08/31/2019 08/04/2019 08/25/2018 11/05/2017 10/21/2017 09/09/2017  Decreased Interest 0 0 0 0 0 0 0  Down, Depressed, Hopeless 0 0 0 0 3 0 0  PHQ - 2 Score 0 0 0 0 3 0 0   Review of Systems  Musculoskeletal: Positive for back pain, gait problem and neck pain.  All other systems reviewed and are negative.      Objective:   Physical Exam Awake, alert, appropriate,  but can tell she's in pain, constantly moving in power w/c, NAD  TTP across thoracic spine B/L- paraspinals  Has lost 40 lbs- obvious on exam       Assessment & Plan:    Pt is a 69 yr old R handed female With hx of HTN, DM, HLD, Vit B levels are high- with hx of thoracic spondylosis/myelopathy and LE weakness- mainly LLE weak.   1. Tramadol 50 mg  50 mg every 6 hours AS NEEDED- side effects constipation, drowsiness I suggest, taking 1/2 tabs  Initially then can increase as needed.   2. Lyrica 50 mg 2x/day - try for 7 days- if tolerated, we can increase when you f/u on phone with me.  Then can increase to 100 mg 2x/day if possible.   3. Continue gabapentin only first day of Lyrica.  Then stop!!!  4. W/c rep- for [employer]- will call to see if can get new w/c- Has [employer].   5. F/U in 6 weeks. Will do opiate contract and UDS at next visit- if stays on tramadol.   I spent a total of 30 minutes on visit- as detailed above going over side effects and plans- decided to stop gabapentin and add  tramadol and lyrica.

## 2020-02-09 ENCOUNTER — Telehealth: Payer: Self-pay | Admitting: Family Medicine

## 2020-02-09 NOTE — Telephone Encounter (Signed)
Please assist me

## 2020-02-09 NOTE — Telephone Encounter (Signed)
Received fax from [employer]. This is a form to be completed. Sending back

## 2020-02-09 NOTE — Telephone Encounter (Signed)
Will get Dr. Susann Givens to sign off on this

## 2020-02-11 DIAGNOSIS — R262 Difficulty in walking, not elsewhere classified: Secondary | ICD-10-CM | POA: Diagnosis not present

## 2020-03-02 ENCOUNTER — Encounter: Payer: Self-pay | Admitting: Family Medicine

## 2020-03-02 ENCOUNTER — Ambulatory Visit (INDEPENDENT_AMBULATORY_CARE_PROVIDER_SITE_OTHER): Payer: Medicare Other | Admitting: Family Medicine

## 2020-03-02 ENCOUNTER — Other Ambulatory Visit: Payer: Self-pay

## 2020-03-02 VITALS — BP 128/60 | HR 80 | Resp 20 | Ht 64.5 in | Wt 219.0 lb

## 2020-03-02 DIAGNOSIS — Z993 Dependence on wheelchair: Secondary | ICD-10-CM | POA: Diagnosis not present

## 2020-03-02 DIAGNOSIS — R10814 Left lower quadrant abdominal tenderness: Secondary | ICD-10-CM | POA: Diagnosis not present

## 2020-03-02 DIAGNOSIS — R29898 Other symptoms and signs involving the musculoskeletal system: Secondary | ICD-10-CM | POA: Diagnosis not present

## 2020-03-02 DIAGNOSIS — R1032 Left lower quadrant pain: Secondary | ICD-10-CM

## 2020-03-02 DIAGNOSIS — I700 Atherosclerosis of aorta: Secondary | ICD-10-CM | POA: Diagnosis not present

## 2020-03-02 DIAGNOSIS — E785 Hyperlipidemia, unspecified: Secondary | ICD-10-CM

## 2020-03-02 DIAGNOSIS — I10 Essential (primary) hypertension: Secondary | ICD-10-CM | POA: Diagnosis not present

## 2020-03-02 DIAGNOSIS — G8929 Other chronic pain: Secondary | ICD-10-CM

## 2020-03-02 DIAGNOSIS — E1169 Type 2 diabetes mellitus with other specified complication: Secondary | ICD-10-CM | POA: Insufficient documentation

## 2020-03-02 MED ORDER — DICYCLOMINE HCL 10 MG PO CAPS: 10.0000 mg | ORAL_CAPSULE | Freq: Three times a day (TID) | ORAL | 0 refills | Status: DC | PRN

## 2020-03-02 NOTE — Progress Notes (Signed)
Subjective:    Patient ID: Cassandra Stewart, female    DOB: 08-Jul-1950, 70 y.o.   MRN: [id number]  HPI Chief Complaint  Patient presents with  . Hypertension    Med check for hypertension and cholesterol, patient is non-fasting. Patient has pain in her lower left abdomen that is worsening-her sister had similar symptoms, her doctor rx'd bentyl for her. Her sisyer was also given meloxicam that has helped her. She brought in Dexcom with her to see if one of [place] could show her how to use.    She is here for 73-month medication management visit. I am managing her HTN along with her cardiologist and HL. She has several specialists and is under a pain contract with Dr. Berline Chough.   Other providers: Dr. Elvera Lennox- Endocrinology Dr. Genice Rouge- pain management Dr. Wynn Banker- back and leg Dr. Vanessa Titianna- Retina Specialist Dr. Rennis Golden- cardiologist Dr. Adela Lank- GI  Complains of worsening LLQ pain. She has had this for years. Her bowel movements are fairly regular but she has noted diarrhea with certain foods. No blood.  Never had a cologuard or colonoscopy but she has a GI. She has seen GI for her LLQ pain. She was prescribed Bentyl at one point but cannot recall if this medication helped or not. She would like to try it again.   Hypertension- reports good compliance with her medications.   Hyperlipidemia- taking lovastatin without concerns    Diabetes is managed by Dr. Elvera Lennox. States she ordered a Dexcom from her insurance.   Elevated liver enzymes in the past  with nonalcoholic fatty liver disease.  Morbid obesity- states she lost 40 lbs but has gained some of it back.    Reviewed allergies, medications, past medical, surgical, family, and social history.    Review of Systems Pertinent positives and negatives in the history of present illness.     Objective:   Physical Exam BP 128/60   Pulse 80   Resp 20   Ht 5' 4.5" (1.638 m)   Wt 219 lb (99.3 kg)   BMI 37.01 kg/m   Alert  and in no distress. She is sitting in a wheelchair.  Cardiac exam shows a regular sinus rhythm without murmurs or gallops. Lungs are clear to auscultation. Abdomen is soft, non distended, normal BS, localized TTP to LLQ without rebound.       Assessment & Plan:  Essential hypertension - Plan: CBC with Differential/Platelet, Comprehensive metabolic panel -Blood pressure controlled.  Continue current medications.  Recommend low-sodium diet.  Hyperlipidemia associated with type 2 diabetes mellitus (HCC) -Continue statin therapy.  She is on moderate intensity statin therapy currently and we are not switching her to high intensity since she is doing well.  Morbid obesity (HCC) -Congratulated her on losing weight however she has gained some back.  Encouraged her to work on weight loss once again.  Wheelchair dependence  Weakness of left leg  Atherosclerosis of aorta (HCC) -Continue statin therapy  Chronic LLQ pain - Plan: CBC with Differential/Platelet, Comprehensive metabolic panel, Ambulatory referral to Gastroenterology, dicyclomine (BENTYL) 10 MG capsule -She would like to try Bentyl again so I will prescribe this for her.  Discussed that if this medication does not help or if she is worsening that I recommend a referral back to GI.  Discussed the possibility that this could be a GU issue and that we could check an ultrasound.  She would like to hold off for now.  Left lower quadrant abdominal tenderness without rebound tenderness -  Plan: Comprehensive metabolic panel, Ambulatory referral to Gastroenterology

## 2020-03-03 LAB — CBC WITH DIFFERENTIAL/PLATELET
Basophils Absolute: 0 10*3/uL (ref 0.0–0.2)
Basos: 1 %
EOS (ABSOLUTE): 0.2 10*3/uL (ref 0.0–0.4)
Eos: 2 %
Hematocrit: 38.4 % (ref 34.0–46.6)
Hemoglobin: 12.7 g/dL (ref 11.1–15.9)
Immature Grans (Abs): 0 10*3/uL (ref 0.0–0.1)
Immature Granulocytes: 0 %
Lymphocytes Absolute: 2.1 10*3/uL (ref 0.7–3.1)
Lymphs: 29 %
MCH: 28.4 pg (ref 26.6–33.0)
MCHC: 33.1 g/dL (ref 31.5–35.7)
MCV: 86 fL (ref 79–97)
Monocytes Absolute: 0.6 10*3/uL (ref 0.1–0.9)
Monocytes: 8 %
Neutrophils Absolute: 4.2 10*3/uL (ref 1.4–7.0)
Neutrophils: 60 %
Platelets: 297 10*3/uL (ref 150–450)
RBC: 4.47 x10E6/uL (ref 3.77–5.28)
RDW: 12.4 % (ref 11.7–15.4)
WBC: 7 10*3/uL (ref 3.4–10.8)

## 2020-03-03 LAB — COMPREHENSIVE METABOLIC PANEL
ALT: 34 IU/L — ABNORMAL HIGH (ref 0–32)
AST: 34 IU/L (ref 0–40)
Albumin/Globulin Ratio: 1.6 (ref 1.2–2.2)
Albumin: 4.1 g/dL (ref 3.7–4.7)
Alkaline Phosphatase: 87 IU/L (ref 44–121)
BUN/Creatinine Ratio: 16 (ref 12–28)
BUN: 19 mg/dL (ref 8–27)
Bilirubin Total: 0.3 mg/dL (ref 0.0–1.2)
CO2: 26 mmol/L (ref 20–29)
Calcium: 9.4 mg/dL (ref 8.7–10.3)
Chloride: 101 mmol/L (ref 96–106)
Creatinine, Ser: 1.18 mg/dL — ABNORMAL HIGH (ref 0.57–1.00)
GFR calc Af Amer: 54 mL/min/{1.73_m2} — ABNORMAL LOW (ref >59)
GFR calc non Af Amer: 47 mL/min/{1.73_m2} — ABNORMAL LOW (ref >59)
Globulin, Total: 2.6 g/dL (ref 1.5–4.5)
Glucose: 238 mg/dL — ABNORMAL HIGH (ref 65–99)
Potassium: 4.8 mmol/L (ref 3.5–5.2)
Sodium: 142 mmol/L (ref 134–144)
Total Protein: 6.7 g/dL (ref 6.0–8.5)

## 2020-03-03 NOTE — Progress Notes (Signed)
Please let her know that her kidney function is slightly higher but still stable and her blood sugar was 238 at her visit yesterday.  Her ALT which is one of her liver enzymes is slightly above normal again so I recommend that she pay close attention to her diet and cut back on foods high in fat.

## 2020-03-04 DIAGNOSIS — Z794 Long term (current) use of insulin: Secondary | ICD-10-CM | POA: Diagnosis not present

## 2020-03-04 DIAGNOSIS — E118 Type 2 diabetes mellitus with unspecified complications: Secondary | ICD-10-CM | POA: Diagnosis not present

## 2020-03-16 ENCOUNTER — Other Ambulatory Visit: Payer: Self-pay | Admitting: Family Medicine

## 2020-03-16 ENCOUNTER — Other Ambulatory Visit: Payer: Self-pay | Admitting: Physical Medicine and Rehabilitation

## 2020-03-16 ENCOUNTER — Other Ambulatory Visit: Payer: Self-pay | Admitting: Internal Medicine

## 2020-03-16 DIAGNOSIS — I10 Essential (primary) hypertension: Secondary | ICD-10-CM

## 2020-03-16 DIAGNOSIS — E782 Mixed hyperlipidemia: Secondary | ICD-10-CM

## 2020-03-17 ENCOUNTER — Encounter: Payer: Medicare Other | Admitting: Physical Medicine and Rehabilitation

## 2020-03-22 ENCOUNTER — Other Ambulatory Visit (INDEPENDENT_AMBULATORY_CARE_PROVIDER_SITE_OTHER): Payer: Medicare Other

## 2020-03-22 DIAGNOSIS — R1032 Left lower quadrant pain: Secondary | ICD-10-CM

## 2020-03-22 DIAGNOSIS — G8929 Other chronic pain: Secondary | ICD-10-CM | POA: Diagnosis not present

## 2020-03-22 DIAGNOSIS — R10814 Left lower quadrant abdominal tenderness: Secondary | ICD-10-CM

## 2020-03-22 LAB — POC HEMOCCULT BLD/STL (HOME/3-CARD/SCREEN)
Card #2 Fecal Occult Blod, POC: NEGATIVE
Card #3 Fecal Occult Blood, POC: NEGATIVE
Fecal Occult Blood, POC: NEGATIVE

## 2020-03-22 NOTE — Progress Notes (Signed)
Her stool cards were all negative for blood.

## 2020-03-23 ENCOUNTER — Other Ambulatory Visit: Payer: Self-pay | Admitting: Physical Medicine and Rehabilitation

## 2020-03-23 NOTE — Telephone Encounter (Signed)
Last clinic note indicates start Lyrica and end gabapentin

## 2020-04-04 DIAGNOSIS — E118 Type 2 diabetes mellitus with unspecified complications: Secondary | ICD-10-CM | POA: Diagnosis not present

## 2020-04-04 DIAGNOSIS — Z794 Long term (current) use of insulin: Secondary | ICD-10-CM | POA: Diagnosis not present

## 2020-04-06 ENCOUNTER — Ambulatory Visit: Payer: Medicare Other | Admitting: Dietician

## 2020-04-06 ENCOUNTER — Other Ambulatory Visit: Payer: Self-pay

## 2020-04-06 ENCOUNTER — Encounter: Payer: Self-pay | Admitting: Internal Medicine

## 2020-04-06 ENCOUNTER — Ambulatory Visit (INDEPENDENT_AMBULATORY_CARE_PROVIDER_SITE_OTHER): Payer: Medicare Other | Admitting: Internal Medicine

## 2020-04-06 VITALS — BP 128/80 | HR 80 | Ht 64.0 in | Wt 218.8 lb

## 2020-04-06 DIAGNOSIS — IMO0002 Reserved for concepts with insufficient information to code with codable children: Secondary | ICD-10-CM

## 2020-04-06 DIAGNOSIS — E1140 Type 2 diabetes mellitus with diabetic neuropathy, unspecified: Secondary | ICD-10-CM

## 2020-04-06 DIAGNOSIS — E538 Deficiency of other specified B group vitamins: Secondary | ICD-10-CM

## 2020-04-06 DIAGNOSIS — E782 Mixed hyperlipidemia: Secondary | ICD-10-CM

## 2020-04-06 DIAGNOSIS — E1165 Type 2 diabetes mellitus with hyperglycemia: Secondary | ICD-10-CM

## 2020-04-06 LAB — POCT GLYCOSYLATED HEMOGLOBIN (HGB A1C): Hemoglobin A1C: 9.9 % — AB (ref 4.0–5.6)

## 2020-04-06 NOTE — Patient Instructions (Addendum)
Please continue: - Metformin ER 500 mg 1-2x a day with meals. - Trulicity 3 mg weekly - Humalog 7-10 units before the meals  Please increase: - Toujeo 36 units at night  Please attach the CGM.  Please get back to the diet.  Please continue B12 1000 mcg every other day.  Please return in 4 months.

## 2020-04-06 NOTE — Addendum Note (Signed)
Addended by: Kenyon Ana on: 04/06/2020 02:35 PM   Modules accepted: Orders

## 2020-04-06 NOTE — Progress Notes (Signed)
Patient ID: Cassandra Stewart, female   DOB: 01/31/1951, 70 y.o.   MRN: [id number]   This visit occurred during the SARS-CoV-2 public health emergency.  Safety protocols were in place, including screening questions prior to the visit, additional usage of staff PPE, and extensive cleaning of exam room while observing appropriate contact time as indicated for disinfecting solutions.   HPI: Cassandra Stewart is a 70 y.o.-year-old female, returning for follow-up for DM2, dx in 1992, insulin-dependent since 2006, uncontrolled, with complications (CKD stage 3, PN, DR).  Last visit 4 months ago.  At last visit, she was on a diet and she lost 40 pounds.  Sugars were much better.  However, since last visit she gained 36 pounds back.  Sugars increased.  Reviewed HbA1c levels: Lab Results  Component Value Date   HGBA1C 7.4 (A) 12/02/2019   HGBA1C 9.3 (A) 07/29/2019   HGBA1C 9.6 (A) 03/30/2019   HGBA1C 9.8 (A) 12/28/2018   HGBA1C 10.7 (A) 07/13/2018   HGBA1C 6.8 (A) 03/16/2018   HGBA1C 6.1 (A) 11/12/2017   HGBA1C 9.0 07/11/2017   HGBA1C 9.0 04/14/2017   HGBA1C 8.7% 10/24/2016   HGBA1C 9.6% 07/25/2016   HGBA1C 10.5% 03/04/2016   HGBA1C 8.1% 10/25/2015   HGBA1C 9.7 (H) 07/04/2015  03/16/2018: HbA1c calculated from fructosamine 6.28%  Pt was on a regimen of: - Metformin 500 mg 1x a day with dinner.  She had diarrhea with a higher dose. - Toujeo 45 units in am - Humalog 18 units 2-3x a day, before meals Tried: Actos, Lantus.  Now on: - Metformin ER 500 mg 1-2x a day with meals. - Trulicity 1.5 >> 3 mg weekly - was out for almost 3 weeks - Toujeo 35 >> 28 units at night - Humalog 7 to 10 units before meals  Is checking sugars once a day-she cannot check more due to carpal tunnel syndrome with numbness and pain in fingers.  She does have a Dexcom CGM but this is not attached yet. Not checking sugars now.  - am: 117-194, but while on steroids: 207-383 >> 67, 96-130, 143 (icecream) >> n/c - 2h  after b'fast: n/c - before lunch: 80-110 >> n/c >> 60s, 70 >> n/c - 2h after lunch: n/c - before dinner: 80-110 >> 90-100 >> 188 >> n/c - 2h after dinner: n/c - bedtime: n/c >> <130 - nighttime: n/c Lowest sugar was 60s >> 117 >> 67; she has hypoglycemia awareness in the 80s. Highest sugar was 188 >> 383 >> 143.  Glucometer: Freestyle  Pt's meals are: - Breakfast/brunch: egg, bacon, toast >> no b'fast now - Lunch: snack mostly (no Humalog) >> Meals on Wheels - Dinner: chicken/fish + vegetables >> moved before 7 pm - Snacks: Belvita cracker if hungry  -+ Stage III CKD; last BUN/creatinine:  Lab Results  Component Value Date   BUN 19 03/02/2020   BUN 18 08/31/2019   CREATININE 1.18 (H) 03/02/2020   CREATININE 1.09 (H) 08/31/2019   Lab Results  Component Value Date   GFRAA 54 (L) 03/02/2020   GFRAA 59 (L) 08/31/2019   GFRAA 58 (L) 03/24/2019   GFRAA 59 (L) 03/12/2019   GFRAA 55 (L) 03/08/2019  Previously on lisinopril 10, but stopped due to hyperkalemia.  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  She has history of cataract surgery OU. -+ Numbness and tingling in her fingers-on Neurontin On ASA 81.  Low vitamin B12:  Reviewed B12 levels: Lab Results  Component Value Date   VITAMINB12 >1526 (H) 07/29/2019   VITAMINB12 >1500 (H) 03/30/2019   VITAMINB12 >1500 (H) 12/28/2018   VITAMINB12 261 03/16/2018   VITAMINB12 300 04/14/2017   VITAMINB12 478 10/25/2015  05/01/2016: Vit B12 248.  We initially started 5000 mcg B12 daily, which was then decreased to 2500 mcg daily and, then to 1000 mcg daily.  Currently on 1000 mcg every other day. She continues to be in a wheelchair as her left leg is very weak-believed to be from a herniated intervertebral disc.   She had surgery for this in 2016 but the strength did not improve.  She has an extensive FH of heart disease - women in her family. Daughter died after her heart stopped.  ROS: Constitutional: no weight gain/no weight loss, no fatigue, no subjective hyperthermia, no subjective hypothermia Eyes: no blurry vision, no xerophthalmia ENT: no sore throat, no nodules palpated in neck, no dysphagia, no odynophagia, no hoarseness Cardiovascular: no CP/no SOB/no palpitations/no leg swelling Respiratory: no cough/no SOB/no wheezing Gastrointestinal: no N/no V/no D/no C/no acid reflux Musculoskeletal: no muscle aches/no joint aches Skin: no rashes, no hair loss Neurological: no tremors/+ numbness/+ tingling-fingers/no dizziness  I reviewed pt's medications, allergies, PMH, social hx, family hx, and changes were documented in the history of present illness. Otherwise, unchanged from my initial visit note.  Past Medical History:  Diagnosis Date  . Elevated LFTs   . Fatty liver    on [place].   Marland Kitchen Hyperlipemia   . Hypertension   . 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 Wheelchair bound    paraplegia of Leg - Left   Past Surgical History:  Procedure Laterality Date  . APPENDECTOMY  1969  . BREAST BIOPSY Right   . CATARACT EXTRACTION     May 21016  . LUMBAR DISC SURGERY    . LUMBAR LAMINECTOMY/DECOMPRESSION MICRODISCECTOMY N/A 07/06/2015   Procedure: Thoracic ten- eleven laminectomy for spinal canal decompression;  Surgeon: Ashok Pall, MD;  Location: [facility];  Service: Orthopedics;  Laterality: N/A;   Social History   Social History  . Marital status: Divorced    Spouse name: N/A  . Number of children: 0   Occupational History  . None   Social History Main Topics  . Smoking status: Never Smoker  . Smokeless tobacco: Never Used  . Alcohol use No  . Drug use: No   Current Outpatient Medications on File Prior to Visit  Medication Sig Dispense Refill  . Accu-Chek  FastClix Lancets MISC USE FOUR TIMES DAILY AS DIRECTED 200 each 3  . acetaminophen (TYLENOL) 650 MG CR tablet Take 650 mg by mouth every 8 (eight) hours as needed for pain. (Patient not taking: Reported on 03/02/2020)    . albuterol (VENTOLIN HFA) 108 (90 Base) MCG/ACT inhaler Inhale 1-2 puffs into the lungs every 4 (four) hours as needed for wheezing or shortness of breath.  (Patient not taking: Reported on 03/02/2020)    . amLODipine (NORVASC) 10 MG tablet TAKE 1 TABLET(10 MG) BY MOUTH EVERY MORNING 90 tablet 1  . aspirin EC 81 MG tablet Take 81 mg by mouth every evening.    . baclofen (LIORESAL) 10 MG tablet Take 0.5-1 tablets (5-10 mg total) by mouth at  bedtime. For muscle spasms- at night. 30 each 5  . Blood Glucose Monitoring Suppl (ACCU-CHEK AVIVA PLUS) w/Device KIT Use as advised 1 kit 1  . Cholecalciferol (VITAMIN D PO) Take 1,000 mg by mouth daily.     . Cyanocobalamin (VITAMIN B 12 PO) Take 5,000 mg by mouth daily.    Marland Kitchen dicyclomine (BENTYL) 10 MG capsule Take 1 capsule (10 mg total) by mouth every 8 (eight) hours as needed for spasms. 90 capsule 0  . Dulaglutide (TRULICITY) 3 ZO/1.0RU SOPN Inject 3 mg into the skin once a week. 6 mL 3  . gabapentin (NEURONTIN) 300 MG capsule TAKE 1 CAPSULE(300 MG) BY MOUTH THREE TIMES DAILY 270 capsule 1  . glucose blood (ACCU-CHEK AVIVA PLUS) test strip Use as instructed to check blood sugar 4 times a day 400 each 12  . hydrochlorothiazide (MICROZIDE) 12.5 MG capsule TAKE 1 CAPSULE(12.5 MG) BY MOUTH DAILY 90 capsule 1  . insulin lispro (HUMALOG) 100 UNIT/ML KwikPen INJECT 7-10 UNITS UNDER THE SKIN 2-3 TIMES DAILY 30 mL 3  . Insulin Pen Needle (CAREFINE PEN NEEDLES) 32G X 4 MM MISC Use 4x a day 300 each 3  . lovastatin (MEVACOR) 40 MG tablet TAKE 1 TABLET(40 MG) BY MOUTH AT BEDTIME 90 tablet 1  . metFORMIN (GLUCOPHAGE-XR) 500 MG 24 hr tablet Take 1 tablet (500 mg total) by mouth 2 (two) times daily. 180 tablet 3  . nitroGLYCERIN (NITROSTAT) 0.4 MG SL tablet  Place 1 tablet (0.4 mg total) under the tongue every 5 (five) minutes as needed for chest pain. (Patient not taking: Reported on 03/02/2020) 25 tablet 3  . polyethylene glycol (MIRALAX / GLYCOLAX) 17 g packet Take 17 g by mouth as needed.    . prednisoLONE acetate (PRED FORTE) 1 % ophthalmic suspension Place 1 drop into the left eye 4 (four) times daily. 10 mL 0  . pregabalin (LYRICA) 50 MG capsule Take 1 capsule (50 mg total) by mouth 2 (two) times daily. (Patient not taking: Reported on 03/02/2020) 14 capsule 0  . TOUJEO SOLOSTAR 300 UNIT/ML Solostar Pen Inject 28 Units into the skin at bedtime. 12 mL 3  . traMADol (ULTRAM) 50 MG tablet Take 1 tablet (50 mg total) by mouth every 6 (six) hours as needed. (Patient not taking: Reported on 03/02/2020) 28 tablet 0   No current facility-administered medications on file prior to visit.   Allergies  Allergen Reactions  . Other     PT PREFERS TO NOT HAVE ANY NARCOTIC MEDICATIONS  . Chlorhexidine Rash    Burning after using CHG wipes-used for surgery  . Oxycodone Hcl     Other reaction(s): Hallucination Marked hallucinations and palpitations following dose of 68m on 04/30/2015    Family History  Problem Relation Age of Onset  . Diabetes Mother   . Heart disease Mother   . Kidney disease Brother        Two brothers on ESRD  . Pancreatic cancer Sister   . Amblyopia Neg Hx   . Blindness Neg Hx   . Cataracts Neg Hx   . Glaucoma Neg Hx   . Macular degeneration Neg Hx   . Retinal detachment Neg Hx   . Strabismus Neg Hx   . Retinitis pigmentosa Neg Hx   . Colon cancer Neg Hx   . Esophageal cancer Neg Hx    Pt has FH of DM in M, MGM, PGM, M aunt, uncles.  PE: BP 128/80   Pulse 80   Ht 5'  4" (1.626 m)   Wt 218 lb 12.8 oz (99.2 kg)   SpO2 95%   BMI 37.56 kg/m  Wt Readings from Last 3 Encounters:  04/06/20 218 lb 12.8 oz (99.2 kg)  03/02/20 219 lb (99.3 kg)  02/02/20 182 lb (82.6 kg)   Constitutional: overweight, in NAD, in  wheelchair Eyes: PERRLA, EOMI, no exophthalmos ENT: moist mucous membranes, no thyromegaly, no cervical lymphadenopathy Cardiovascular: RRR, No MRG Respiratory: CTA B Gastrointestinal: abdomen soft, NT, ND, BS+ Musculoskeletal: no deformities, strength intact in all 4 Skin: moist, warm, no rashes Neurological: no tremor with outstretched hands, DTR normal in all 4  ASSESSMENT: 1. DM2, insulin-dependent, uncontrolled, with complications - CKD stage 3 - PN - DR  - Of note, we tried to prescribe the Freestyle libre CGM for her but this was not covered by the insurance  2. Low B12  3. HL  PLAN:  1. Patient with longstanding, type 2 diabetes, on oral antidiabetic regimen with and also basal-bolus insulin and weekly GLP-1 receptor agonist.  At last visit, after starting the diet, she lost almost 40 and her sugars improved significantly.  HbA1c decreased to 7.4%.  We decreased her basal insulin dose at that time. -However, since she came off the diet and gained 36 pounds back.  She was off Trulicity for 3 weeks, but restarted after the holidays.  She did not check blood sugar since last visit.  She obtained a CGM (Dexcom) but she did not attach it yet >> at today's visit, I scheduled her an appointment with the diabetes educators that she would have visit-before she leaves the office. -At today's visit, we will increase the Toujeo dose, but otherwise we will continue the same regimen.  I strongly advised her to go back to the previous diet.  She already started to include more salads and snack on a healthier foods (nuts).  - I suggested to:  Patient Instructions  Please continue: - Metformin ER 500 mg 1-2x a day with meals. - Trulicity 3 mg weekly - Humalog 7-10 units before the meals  Please increase: - Toujeo 36 units at night  Please attach the CGM.  Please get back to the diet.  Please continue B12 1000 mcg every other day.  Please return in 4 months.   - we checked her  HbA1c: 9.9% (much higher) - advised to check sugars at different times of the day - 4x a day, rotating check times - advised for yearly eye exams >> she is UTD - return to clinic in 4 months  2. Low B12 -She has a history of low vitamin B12 and we started supplementation with p.o. B12 -She is not taking 1000 mcg every other day -We will repeat a B12 level today.  3. HL -Reviewed latest lipid panel from 08/2019: Excellent lipid fractions: Lab Results  Component Value Date   CHOL 152 08/31/2019   HDL 64 08/31/2019   LDLCALC 67 08/31/2019   TRIG 121 08/31/2019   CHOLHDL 2.4 08/31/2019  -Continues Mevacor 40 without side effects  Philemon Kingdom, MD PhD [facility]

## 2020-04-07 NOTE — Progress Notes (Signed)
Dexcom G6 Personal CGM Training  Start time:1110    End time: 1140 Total time: 30 Cassandra Stewart was educated about the following:  -Getting to know device    Warden/ranger ) -Setting up device (high alert  250  , low alert 70  ) -Setting alert profile -Inserting sensor (left upper arm WNL) -Calibrating- none required for G6 -Ending sensor session -Trouble shooting -Tape guide, clarity information  Patient has [facility] tech support and my contact information. Instructed patient to sign up for a Dexcom account and then sign into the dexcom/clarity website and enter her sharing code [email or web address] so that she can share her data with the [place] office.  Jeoffrey Massed, RD 04/07/2020 4:16 PM.

## 2020-04-26 DIAGNOSIS — E118 Type 2 diabetes mellitus with unspecified complications: Secondary | ICD-10-CM | POA: Diagnosis not present

## 2020-04-26 DIAGNOSIS — Z794 Long term (current) use of insulin: Secondary | ICD-10-CM | POA: Diagnosis not present

## 2020-05-18 ENCOUNTER — Ambulatory Visit: Payer: Medicare Other | Attending: Family Medicine | Admitting: Physical Therapy

## 2020-05-18 ENCOUNTER — Other Ambulatory Visit: Payer: Self-pay

## 2020-05-18 ENCOUNTER — Ambulatory Visit: Payer: Medicare Other | Admitting: Dietician

## 2020-05-18 DIAGNOSIS — M545 Low back pain, unspecified: Secondary | ICD-10-CM | POA: Diagnosis not present

## 2020-05-18 DIAGNOSIS — R29818 Other symptoms and signs involving the nervous system: Secondary | ICD-10-CM | POA: Diagnosis not present

## 2020-05-18 DIAGNOSIS — R293 Abnormal posture: Secondary | ICD-10-CM | POA: Insufficient documentation

## 2020-05-18 DIAGNOSIS — M6281 Muscle weakness (generalized): Secondary | ICD-10-CM | POA: Diagnosis not present

## 2020-05-18 DIAGNOSIS — R208 Other disturbances of skin sensation: Secondary | ICD-10-CM | POA: Insufficient documentation

## 2020-05-18 DIAGNOSIS — G8929 Other chronic pain: Secondary | ICD-10-CM | POA: Insufficient documentation

## 2020-05-18 NOTE — Therapy (Addendum)
[facility] [address] [place], [place], [address] Phone: [phone]   Fax:  [phone]  Physical Therapy Evaluation  Patient Details  Name: Cassandra Stewart MRN: [id number] Date of Birth: [birth year] Referring Provider (PT): Courtney Heys, MD   Encounter Date: 05/18/2020   PT End of Session - 05/18/20 1628    Visit Number 1    Number of Visits 1    Date for PT Re-Evaluation 05/18/20    Authorization Type U[employer] Medicare    PT Start Time 1145    PT Stop Time 1218    PT Time Calculation (min) 33 min    Activity Tolerance Patient tolerated treatment well    Behavior During Therapy [facility] for tasks assessed/performed           Past Medical History:  Diagnosis Date  . Elevated LFTs   . Fatty liver    on [place].   Marland Kitchen Hyperlipemia   . Hypertension   . Uncontrolled type 2 diabetes with neuropathy ([place])   . Wheelchair bound    paraplegia of Leg - Left    Past Surgical History:  Procedure Laterality Date  . APPENDECTOMY  1969  . BREAST BIOPSY Right   . CATARACT EXTRACTION     May 21016  . LUMBAR DISC SURGERY    . LUMBAR LAMINECTOMY/DECOMPRESSION MICRODISCECTOMY N/A 07/06/2015   Procedure: Thoracic ten- eleven laminectomy for spinal canal decompression;  Surgeon: Ashok Pall, MD;  Location: [facility];  Service: Orthopedics;  Laterality: N/A;    There were no vitals filed for this visit.    Subjective Assessment - 05/18/20 1616    Subjective Pt well known to this therapist and clinic. Pt was evaluated two years ago for new seating system for power wheelchair and has been using Captain's Seat/cushion for the past two years with worsening posture and pain in back.  Pt's power w/c base is 72 years old and pt would like new power wheelchair and seating system to provide her with greater support, positioning and pressure relief.    Pertinent History Elevated LFT, fatty liver, HLD, HTN, uncontrolled DM with neuropathy, T  10-11 laminectomy/decompression microdiscectomy    Patient Stated Goals to obtain a new power w/c    Currently in Pain? 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    Referring Provider (PT) Courtney Heys, MD    Onset Date/Surgical Date 02/22/20    Prior Therapy previous w/c evaluation      Precautions   Precautions Other (comment)    Precaution Comments Elevated LFT, fatty liver, HLD, HTN, uncontrolled DM with neuropathy, T 10-11 laminectomy/decompression microdiscectomy      Prior Function   Level of Independence Independent with household mobility with device;Independent with community mobility with device;Independent with homemaking with wheelchair;Independent with transfers;Requires assistive device for independence             Mobility/Seating Evaluation    PATIENT INFORMATION: Name: Cassandra Stewart DOB: [birth year]  Sex: Female Date seen: 05/18/2020 Time: 11:45   Address:  [address]  [place] [address] Physician: Dr. Courtney Heys This evaluation/justification form will serve as the LMN for the following suppliers: __________________________ Supplier: [employer] Contact Person: Deberah Pelton, [employer] Phone:  [phone]   Seating Therapist: Misty Stanley, PT Phone:   [phone]   Phone: [phone]  Spouse/Parent/Caregiver name: N/A  Phone number: N/A Insurance/Payer: UHC Medicare     Reason for Referral: New power wheelchair  Patient/Caregiver Goals: New power wheelchair with improved seating system  Patient was seen for face-to-face evaluation for new power wheelchair.  Also present was [employer] to discuss recommendations and wheelchair options.  Further paperwork was completed and sent to vendor.  Patient appears to qualify for power mobility device at this time per objective findings.   MEDICAL HISTORY: Diagnosis: Primary Diagnosis: Thoracic  spondylosis/Myelopathy Onset: 2014 Diagnosis: Wheelchair bound; LE paraparesis   _0 Progressive Disease Relevant past and future surgeries: T10 - T11 lumbar laminectomy and decompression with microdiscectomy (2017)     Height: 5'4" Weight: 186 lb Explain recent changes or trends in weight: Weight Loss   History including Falls: No falls in past 6 months; PMH: CKD - stage 3, thoracic spinal stenosis, morbid obesity, inability to walk, Fatty liver disease, hyperlipidemia, HTN, uncontrolled type 2 DM with neuropathy     HOME ENVIRONMENT: _1 House  _2 Condo/town home  _3 Apartment  _4 Assisted Living    _5 Lives Alone _6  Lives with Others                                                                                          Hours with caregiver: ?????  _7 Home is accessible to patient           Stairs      _8 Yes _9  No     Ramp _10 Yes _11 No Comments:  Apartment is level entry.  Tile in kitchen and bathroom/dining area, low carpet in bedroom and living room.  Pt is able to access bathroom with power wheelchair    COMMUNITY ADL: TRANSPORTATION: _12 Car    _13 Van    <ZDGLOVFIEPPIRJJO>_8<\/CZYSAYTKZSWFUXNA>_35 Public Transportation    _15 Adapted w/c Lift    _16 Ambulance    _17 Other:       _18 Sits in wheelchair during transport  Employment/School: ????? Specific requirements pertaining to mobility ?????  Other: ?????    FUNCTIONAL/SENSORY PROCESSING SKILLS:  Handedness:   _19 Right     _20 Left    _21 NA  Comments:  ?????  Functional Processing Skills for Wheeled Mobility _22 Processing Skills are adequate for safe wheelchair operation  Areas of concern than may interfere with safe operation of wheelchair Description of problem   _23  Attention to environment      _24 Judgment      _25  Hearing  _26  Vision or visual processing      _27 Motor Planning  _28  Fluctuations in Behavior  ?????    VERBAL COMMUNICATION: _29 WFL receptive _30  WFL expressive _31 Understandable  _32 Difficult to understand  _33 non-communicative _34  Uses an augmented communication device   CURRENT SEATING / MOBILITY: Current Mobility Base:  _35 None _36 Dependent _37 Manual _38 Scooter _39 Power  Type of Control: joystick  Manufacturer:  Retail buyer:  20 x 18 Age: 41 years  Current Condition of Mobility Base:  Fair, multiple rips in arm rest and seat back cloth - covered in electrical tape   Current Wheelchair components:  Flip up, full length arm rests, seat belt, center flip up foot plate, Captain's seat  Describe posture in present seating system:  Increased lordosis, laterally shifted  to L, hips abducted      SENSATION and SKIN ISSUES: Sensation _0 Intact  _1 Impaired _2 Absent  Level of sensation: bilat hands; due to neuropathy Pressure Relief: Able to perform effective pressure relief :    _3 Yes  _4  No Method: ????? If not, Why?: Lacks sufficient strength in UE to perform wheelchair pushup, unable to laterally lean to fully unweight buttocks and unable to stand long enough to sufficiently off weight hips/pelvis  Skin Issues/Skin Integrity Current Skin Issues  _5 Yes _6 No _7 Intact _8  Red area_9  Open Area  _10 Scar Tissue _11 At risk from prolonged sitting Where  ?????  History of Skin Issues  _12 Yes _13 No Where  Sacrum/Coccyx When  2017  Hx of skin flap surgeries  _14 Yes _15 No Where  ????? When  ?????  Limited sitting tolerance _16 Yes _17 No Hours spent sitting in wheelchair daily: 2-3 hours limited due to pain  Complaint of Pain:  Please describe: Pain in upper and lower back; neuropathic pain in bilat hands   Swelling/Edema: Dependent edema in ankles, L pitting edema - has to transfer to bed to elevate bilat LE to manage edema   ADL STATUS (in reference to wheelchair use):  Indep Assist Unable Indep with Equip Not assessed Comments  Dressing _18  _19  _20  _21  _22  transfers w/c > raised toilet seat to dress  Eating _23  _24  _25  _26  _27  seated in wheelchair  Toileting _28  _29  _30  _31  _32  transfers from w/c > raised toilet seat  Bathing _33  _34  _35  _36  _37  transfers from w/c > tub bench   Grooming/Hygiene _38  _39  _40  _41  _42  seated in w/c  Meal Prep _43  _44  _45  _46  _47  seated in w/c  IADLS <IRWERXVQMGQQPYPP>_5<\/KDTOIZTIWPYKDXIP>_38  _49  _50  _51  _52  wheelchair level  Bowel Management: _53 Continent  _54 Incontinent  _55 Accidents Comments:  ?????  Bladder Management: _56 Continent  _57 Incontinent  _58 Accidents Comments:  intermittent accidents when not able to reach bathroom in time     WHEELCHAIR SKILLS: Manual w/c Propulsion: _59 UE or LE strength and endurance sufficient to participate in ADLs using manual wheelchair Arm : _60 left _61 right   _62 Both      Distance: ????? Foot:  _63 left _64 right   _65 Both  Operate Scooter: _66  Strength, hand grip, balance and transfer appropriate for use _67 Living environment is accessible for use of scooter  Operate Power w/c:  _68  Std. Joystick   _69  Alternative Controls Indep _70  Assist _71  Dependent/unable _72  N/A _73   _74 Safe          _75  Functional      Distance: Community distances  Bed confined without wheelchair _76  Yes _77  No   STRENGTH/RANGE OF MOTION:  AROM Range of Motion Strength  Shoulder WFL but pain in upper back 3+/5  Elbow WFL 4-/5  Wrist/Hand WFL 4-/5  Hip WFL R: 2/5; L: 1/5  Knee WFL R: 3/5; L: 2+/5  Ankle R WFL; L: lacking 30 degrees to neutral DF R: 3/5; L: 2/5     MOBILITY/BALANCE:  _78  Patient is totally dependent for mobility  ?????    Balance Transfers Ambulation  Sitting Balance: Standing Balance: _79  Independent _80  Independent/Modified Independent  _81  WFL     _82  [facility] _83  Supervision _84  Supervision  _85  Uses UE for balance  _86  Supervision _87  Min Assist _88  Ambulates with Assist  ?????    _89  Min Assist _90  Min assist _91  Mod Assist _92  Ambulates with Device:      _93  RW  _94  StW  _95  Cane  _96  ?????  _97  Mod Assist _98  Mod assist _99  Max  assist   _0  Max Assist _1  Max assist _2  Dependent _3  Indep. Short Distance Only  _4  Unable _5  Unable _6  Lift / Sling Required Distance (in feet)  ?????   _7  Sliding board _8  Unable to Ambulate (see explanation below)  Cardio Status:  _9 Intact   _10  Impaired   _11  NA     ?????  Respiratory Status:  _12 Intact   _13 Impaired   _14 NA     ?????  Orthotics/Prosthetics: N/A  Comments (Address manual vs power w/c vs scooter): Cassandra Stewart has mobility limitation that significantly impairs safe, timely participation in one or more mobility related ADL's.  Cassandra Stewart presents with lower extremity muscle paresis due to thoracic spondylosis and myelopathy as well as peripheral neuropathy and has been utilizing power mobility for safe, independent household and community mobility since 2017.  Cassandra Stewart's mobility deficit cannot be remediated with a cane or walker due to significant lower extremity weakness, upper extremity weakness and impaired sensation; Cassandra Stewart has been unable to ambulate since 2017.  Cassandra Stewart is unable to propel any type of manual wheelchair functionally and independently in her home or community environment due to weakness in her upper extremities, impaired sensation in her hands and neuropathic pain due to neuropathy.  Cassandra Stewart's home would not support the use of a wide manual wheelchair.  Najah requires the narrower width and turning radius of a power wheelchair to access her bathroom.  Ellysia is not able to safely operate a power scooter (POV) due to lack of upper extremity strength and sensation to drive a tiller style propulsion system.  She also lacks sufficient lower extremity strength and balance to safely transfer on to and off a power scooter.  Cassandra Stewart has been utilizing a group 2 power wheelchair with captain's seating since 2017 and this has been insufficient to meet Cassandra Stewart's seating and positioning needs.  Tatia has a history of sacral ulceration and is unable to perform appropriate pressure relief in her current chair; she also presents with multiple postural abnormalities and obliquities that result in significant amounts of back pain.  Cassandra Stewart would benefit from a skin protection and positioning cushion to maximize pressure  distribution, lower extremity positioning, and pain management.  She also presents with bilateral lower extremity edema, pitting in the left lower extremity.  Due to ineffective pressure relief and edema, Cassandra Stewart would benefit from power tilt and power articulating leg rests to minimize the number of transfers she must perform in a day which will lower her falls risk.  Cassandra Stewart lives alone and is a lifetime wheelchair user.  She requires the use of a group 2 power wheelchair with power tilt and power articulating foot platform to maximize safe, independent functional mobility, reduce her falls risk, for edema and pain management, and to optimize pressure distribution.            Anterior / Posterior Obliquity Rotation-Pelvis ?????  PELVIS    _15  _16  _17   Neutral Posterior Anterior  _18  _19  _20   WFL Rt elev Lt elev  _21  _22  _23   WFL Right Left                      Anterior    Anterior     _24  Fixed _25  Other _26  Partly Flexible _27  Flexible   _28  Fixed _29  Other _30  Partly Flexible  _31  Flexible  _32  Fixed _33  Other _34  Partly Flexible  _35  Flexible   TRUNK  _36  _37  _38   WFL ? Thoracic ? Lumbar  Kyphosis  Lordosis  _0  _1  _2   WFL Convex Convex  Right Left _3 c-curve _4 s-curve _5 multiple  _6  Neutral _7  Left-anterior _8  Right-anterior     _9  Fixed _10  Flexible _11  Partly Flexible _12  Other  _13  Fixed _14  Flexible _15  Partly Flexible _16  Other  _17  Fixed             _18  Flexible _19  Partly Flexible _20  Other    Position Windswept  ?????  HIPS          _21            _22               _23    Neutral       Abduct        ADduct         _24           _25            _26   Neutral Right           Left      _27  Fixed _28  Subluxed _29  Partly Flexible _30  Dislocated _31  Flexible  _32  Fixed _33  Other _34  Partly Flexible  _35  Flexible                 Foot Positioning Knee Positioning  Lacks 30 degrees to neutral ankle dorsiflexion in L ankle    _36  WFL  _37 Lt _38 Rt _39  WFL  _40 Lt _41 Rt    KNEES ROM concerns: ROM concerns:     & Dorsi-Flexed _42 Lt _43 Rt ?????    FEET Plantar Flexed _44 Lt _45 Rt      Inversion                 _46 Lt _47 Rt      Eversion                 _48 Lt _49 Rt     HEAD _50  Functional _51  Good Head Control  ?????  & _52  Flexed         _53  Extended _54  Adequate Head Control    NECK _55  Rotated  Lt  _56  Lat Flexed Lt _57  Rotated  Rt _58  Lat Flexed Rt _59  Limited Head Control     _60  Cervical Hyperextension _61  Absent  Head Control     SHOULDERS ELBOWS WRIST& HAND ?????      Left     Right    Left     Right    Left     Right   U/E _62 Functional           _63 Functional WFL WFL _64 Fisting             _65 Fisting      _66 elev   _67 dep      _68 elev   _69 dep       _70 pro -_71 retract     _72 pro  _73 retract _74 subluxed             _75 subluxed           Goals for Wheelchair Mobility  _76  Independence with mobility in the home with motor related ADLs (MRADLs)  _77  Independence with MRADLs in the community _78  Provide dependent mobility  _79  Provide recline     _80 Provide tilt   Goals for Seating system _81  Optimize pressure distribution _82  Provide support needed to facilitate function or safety _83  Provide corrective forces to assist with maintaining or improving posture _84  Accommodate client's posture:   current seated postures and positions are not flexible or will not tolerate corrective forces [  x] Client to be independent with relieving pressure in the wheelchair _0 Enhance physiological function such as breathing, swallowing, digestion  Simulation ideas/Equipment trials:????? State why other equipment was unsuccessful:?????   MOBILITY BASE RECOMMENDATIONS and JUSTIFICATION: MOBILITY COMPONENT JUSTIFICATION  Manufacturer: PrideModel: J4 2 SP-SS   Size: Width 20Seat Depth 18 _1 provide transport from point A to B      _2 promote Indep mobility  _3 is not a safe, functional ambulator _4 walker or cane inadequate _5 non-standard width/depth necessary to accommodate anatomical measurement _6  ?????  _7 Manual Mobility Base  _8 non-functional ambulator    _9 Scooter/POV  _10 can safely operate  _11 can safely transfer   _12 has adequate trunk stability  _13 cannot functionally propel manual w/c  _14 Power Mobility Base  _15 non-ambulatory  _16 cannot functionally propel manual wheelchair  _17  cannot functionally and safely operate scooter/POV _18 can safely operate and willing to  _19 Stroller Base _20 infant/child  _21 unable to propel manual wheelchair _22 allows for growth _23 non-functional ambulator _24 non-functional UE _25 Indep mobility is not a goal at this time  _26 Tilt  _27 Forward _28 Backward _29 Powered tilt  _30 Manual tilt  _31 change position against gravitational force on head and shoulders  _32 change position for pressure relief/cannot weight shift _33 transfers  _34 management of tone _35 rest periods _36 control edema _37 facilitate postural control  _38  ?????  _39 Recline  _40 Power recline on power base _41 Manual recline on manual base  _42 accommodate femur to back angle  _43 bring to full recline for ADL care  _44 change position for pressure relief/cannot weight shift _45 rest periods _46 repositioning for transfers or clothing/diaper /catheter changes _47 head positioning  _48 Lighter weight required _49 self- propulsion  _50 lifting _51  ?????  _52 Heavy Duty required _53 user weight greater than 250# _54 extreme tone/ over active movement _55 broken frame on previous chair _56  ?????  _57  Back  _58  Angle Adjustable _59  Custom molded ????? _60 postural control _61 control of tone/spasticity _62 accommodation of range of motion _63 UE functional control _64 accommodation for seating system _65  ????? _66 provide lateral trunk support _67 accommodate deformity _68 provide posterior trunk support _69 provide lumbar/sacral support _70 support trunk in midline _71 Pressure relief over spinal processes  _72  Seat Cushion Tru Comfort 2SPP _73 impaired sensation  _74 decubitus ulcers present _75 history of pressure ulceration _76 prevent pelvic extension _77 low maintenance   _78 stabilize pelvis  _79 accommodate obliquity _80 accommodate multiple deformity _81 neutralize lower extremity position _82 increase pressure distribution _83  ?????  _84  Pelvic/thigh support  _85  Lateral thigh guide _86  Distal medial pad  _87  Distal lateral pad _88  pelvis in neutral _89 accommodate pelvis _90  position upper legs _91  alignment _92  accommodate ROM _93  decr adduction _94 accommodate tone _95 removable for transfers _96 decr abduction  _97  Lateral trunk Supports _98  Lt     _99  Rt _100 decrease lateral trunk leaning _101 control tone _102 contour for increased contact _103 safety  _104 accommodate asymmetry _105  ?????  _106  Mounting hardware  _107 lateral trunk supports  _108 back   _109 seat _110 headrest      _111  thigh support _112 fixed   _113 swing away _114 attach seat platform/cushion to w/c frame _115 attach back cushion to w/c frame _116 mount postural supports _117 mount headrest  _118 swing medial thigh support away _119 swing lateral supports away for transfers  _120  ?????    Armrests  _121 fixed _122 adjustable height _123 removable   _124 swing away  _125 flip back   _126 reclining _127 full length pads _128 desk    _129 pads tubular  _130 provide support with elbow at 90   _131 provide support for w/c tray _132 change of height/angles for variable activities _133 remove for transfers _134 allow to come closer to table top _135 remove for access to tables _136  ?????  Hangers/ Leg rests  _137 60 _138 70 _139 90 _140 elevating _141 heavy duty  _142 articulating _143 fixed _144 lift off _145 swing away     [  x]power _0 provide LE support  _1 accommodate to hamstring tightness _2 elevate legs during recline   _3 provide change in position for Legs _4 Maintain placement of feet on footplate _5 durability _6 enable transfers _7 decrease edema _8 Accommodate lower leg length _9  Elevate legs during tilt  Foot support Footplate    <ZOXWRUEAVWUJWJXB>_1<\/YNWGNFAOZHYQMVHQ>_46 Lt  _11  Rt  _12  Center mount _13 flip up     _14 depth/angle adjustable _15 Amputee adapter    _16  Lt     _17  Rt _18 provide foot support _19 accommodate to ankle  ROM _20 transfers _21 Provide support for residual extremity _22  allow foot to go under wheelchair base _23  decrease tone  _24  Width Extension for 11D Footplates to accommodate leg position/abduction  _25  Ankle strap/heel loops _26 support foot on foot support _27 decrease extraneous movement _28 provide input to heel  _29 protect foot  Tires: _30 pneumatic  _31 flat free inserts  _32 solid  _33 decrease maintenance  _34 prevent frequent flats _35 increase shock absorbency _36 decrease pain from road shock _37 decrease spasms from road shock _38  ?????  _39  Headrest  _40 provide posterior head support _41 provide posterior neck support _42 provide lateral head support _43 provide anterior head support _44 support during tilt and recline _45 improve feeding   _46 improve respiration _47 placement of switches _48 safety  _49 accommodate ROM  _50 accommodate tone _51 improve visual orientation  _52  Anterior chest strap _53  Vest _54  Shoulder retractors  _55 decrease forward movement of shoulder _56 accommodation of TLSO _57 decrease forward movement of trunk _58 decrease shoulder elevation _59 added abdominal support _60 alignment _61 assistance with shoulder control  _62  ?????  Pelvic Positioner _63 Belt _64 SubASIS bar _65 Dual Pull _66 stabilize tone _67 decrease falling out of chair/ **will not Decr potential for sliding due to pelvic tilting _68 prevent excessive rotation _69 pad for protection over boney prominence _70 prominence comfort _71 special pull angle to control rotation _72  ?????  Upper Extremity Support _73 L   _74  R _75 Arm trough    _76 hand support _77  tray       _78 full tray _79 swivel mount _80 decrease edema      _81 decrease subluxation   _82 control tone   _83 placement for AAC/Computer/EADL _84 decrease gravitational pull on shoulders _85 provide midline positioning _86 provide support to increase UE function _87 provide hand support in natural position _88 provide work surface   POWER WHEELCHAIR CONTROLS  _89 Proportional  _90 Non-Proportional Type  Joystick _91 Left  _92 Right _93 provides access for controlling wheelchair   _94 lacks motor control to operate proportional drive control <NGEXBMWUXLKGMWNU>_2<\/VOZDGUYQIHKVQQVZ>_56 unable to understand proportional controls  Actuator Control Module  _96 Single  _97 Multiple   _98 Allow the client to operate the power seat function(s) through the joystick control   _99 Safety Reset Switches _100 Used to change modes and stop the wheelchair when driving in latch mode    _101 [employer]   _102 programming for accurate control _103 progressive Disease/changing condition _104 non-proportional drive control needed _105 Needed in order to operate power seat functions through joystick control   _106 Display box _107 Allows user to see in which mode and drive the wheelchair is set  _108 necessary for alternate controls    _109 Digital interface electronics _110 Allows w/c to operate when using alternative drive controls  <LOVFIEPPIRJJOACZ>_6<\/SAYTKZSWFUXNATFT>_732 ASL Head Array _112 Allows client to operate wheelchair  through switches placed in tri-panel headrest  _113 Sip and puff with tubing kit _114 needed to operate sip and puff drive controls  <KGURKYHCWCBJSEGB>_1<\/DVVOHYWVPXTGGYIR>_485 Upgraded tracking electronics _116 increase safety when driving <IOEVOJJKKXFGHWEX>_9<\/BZJIRCVELFYBOFBP>_102 correct tracking when on uneven surfaces  _118 [place] [facility] for switches or joystick _119 Attaches switches to w/c  _120 Swing away for access or transfers _121 midline for optimal placement _122 provides for consistent access  _123 Attendant controlled joystick plus mount _124 safety _125 long distance driving <HENIDPOEUMPNTIRW>_4<\/RXVQMGQQPYPPJKDT>_267 operation of seat functions _127 compliance with transportation regulations _128  ?????    Rear wheel placement/Axle adjustability _129 None _130 semi  adjustable _0 fully adjustable  _1 improved UE access to wheels _2 improved stability _3 changing angle in space for improvement of postural stability _4 1-arm drive access <[id number]>_3<\/[id number]>_7 amputee pad placement _6  ?????  Wheel rims/ hand rims  _7 metal  _8 plastic coated _9 oblique projections _10 vertical projections _11 Provide ability to propel manual wheelchair  _12  Increase self-propulsion with hand  weakness/decreased grasp  Push handles _13 extended  _14 angle adjustable  _15 standard _16 caregiver access _17 caregiver assist _18 allows "hooking" to enable increased ability to perform ADLs or maintain balance  One armed device  _19 Lt   _20 Rt _21 enable propulsion of manual wheelchair with one arm   _22  ?????   Brake/wheel lock extension _23  Lt   _24  Rt _25 increase indep in applying wheel locks   _26 Side guards _27 prevent clothing getting caught in wheel or becoming soiled _28  prevent skin tears/abrasions  Battery: NF 22 x 2 _29 to power wheelchair ?????  Other: F6433 Footplate Connector Connect foot plates to make single plate ?????  The above equipment has a life- long use expectancy. Growth and changes in medical and/or functional conditions would be the exceptions. This is to certify that the therapist has no financial relationship with durable medical provider or manufacturer. The therapist will not receive remuneration of any kind for the equipment recommended in this evaluation.   Patient has mobility limitation that significantly impairs safe, timely participation in one or more mobility related ADL's.  (bathing, toileting, feeding, dressing, grooming, moving from room to room)                                                             _30  Yes _31  No Will mobility device sufficiently improve ability to participate and/or be aided in participation of MRADL's?         _32  Yes _33  No Can limitation be compensated for with use of a cane or walker?                                                                                _34  Yes _35  No Does patient or caregiver demonstrate ability/potential ability & willingness to safely use the mobility device?   _36  Yes _37  No Does patient's home environment support use of recommended mobility device?                                                    _38  Yes _39  No Does patient have sufficient upper extremity function necessary to functionally propel a manual wheelchair?    _40   Yes _41  No Does patient have sufficient strength and trunk stability to safely operate a POV (scooter)?                                  _42  Yes _43  No Does patient need additional features/benefits provided by a power wheelchair  for MRADL's in the home?       _0  Yes _1  No Does the patient demonstrate the ability to safely use a power wheelchair?                                                              _2  Yes _3  No  Therapist Name Printed: Tilda Burrow. Melrose Nakayama, PT, DPT Date: 05/18/2020  Therapist's Signature:   Date: 05/18/2020  Supplier's Name Printed: [employer] Date: 05/18/2020  Supplier's Signature:   Date:  Patient/Caregiver Signature:   Date:     This is to certify that I have read this evaluation and do agree with the content within:      Physician's Name Printed: Dr. Courtney Heys  Physician's Signature:  Date:     This is to certify that I, the above signed therapist have the following affiliations: _4  This DME provider _5  Manufacturer of recommended equipment _6  Patient's long term care facility _7  None of the above       Objective measurements completed on examination: See above findings.               PT Education - 05/18/20 1628    Education Details power wheelchair recommendations, process for obtaining    Person(s) Educated Patient    Methods Explanation    Comprehension Verbalized understanding                       Plan - 05/18/20 1629    Clinical Impression Statement Pt is a 70 year old female referred to [facility] for evaluation for new power wheelchair due to pt's current wheelchair and seating system not providing pt with appropriate positioning and pressure relief.  Pt's PT evaluation revealed the following impairments and functional limitations: impaired sensation and neuropathic pain in bilat UE, mid and lower back pain, weakness in bilat UE, LE muscle paralysis, LLE edema, impaired posture, impaired balance and  difficulty walking.  Pt would benefit from a group 2 power wheelchair with tilt, ELR, skin protection and positioning cushion to maximize functional mobility independence and continue to decrease risk for falls.    Personal Factors and Comorbidities Comorbidity 3+;Past/Current Experience;Social Background    Comorbidities Elevated LFT, fatty liver, HLD, HTN, uncontrolled DM with neuropathy, T 10-11 laminectomy/decompression microdiscectomy    Examination-Activity Limitations Bathing;Locomotion Level;Toileting;Transfers    Examination-Participation Restrictions Community Activity;Meal Prep    Stability/Clinical Decision Making Evolving/Moderate complexity    Clinical Decision Making Moderate    Rehab Potential Good    PT Frequency One time visit    PT Duration Other (comment)   1 visit only for w/c eval   PT Treatment/Interventions Other (comment)   wheelchair management          Patient will benefit from skilled therapeutic intervention in order to improve the following deficits and impairments:  Decreased mobility,Decreased strength,Difficulty walking,Increased edema,Postural dysfunction,Pain  Visit Diagnosis: Muscle weakness (generalized)  Abnormal posture  Other disturbances of skin sensation  Other symptoms and signs involving the nervous system  Chronic midline low back pain without sciatica     Problem List Patient Active Problem List   Diagnosis Date Noted  . Hyperlipidemia associated with type 2 diabetes mellitus ([place]) 03/02/2020  . Atherosclerosis of aorta ([place]) 03/02/2020  .  Wheelchair dependence 08/04/2019  . Thoracic spondylosis with myelopathy 08/04/2019  . Bilateral carpal tunnel syndrome 07/06/2019  . Family history of heart disease 12/04/2018  . At risk for heart disease 12/04/2018  . Abdominal pain, chronic, left lower quadrant 08/25/2018  . Personal history of noncompliance with medical treatment, presenting hazards to health 01/29/2017  . Low serum  vitamin B12 11/15/2016  . CKD (chronic kidney disease) stage 3, GFR 30-59 ml/min (HCC) 10/24/2016  . Fatty liver disease, nonalcoholic 19/75/8832  . Hyperlipemia   . Estrogen deficiency 10/25/2015  . Thoracic spinal stenosis 07/06/2015  . UTI (lower urinary tract infection) 07/04/2015  . Uncontrolled type 2 diabetes with neuropathy ([place]) 07/04/2015  . Essential hypertension 07/04/2015  . Morbid obesity ([place]) 07/04/2015  . Inability to walk 07/04/2015  . Weakness of left leg 07/04/2015  . Elevated LFTs 07/04/2015    Rico Junker, PT, DPT 05/18/20    4:38 PM    [facility] [address] [place], [place], [address] Phone: [phone]   Fax:  [phone]  Name: Wendolyn Raso MRN: [id number] Date of Birth: January 29, 1951

## 2020-05-27 DIAGNOSIS — E118 Type 2 diabetes mellitus with unspecified complications: Secondary | ICD-10-CM | POA: Diagnosis not present

## 2020-05-27 DIAGNOSIS — Z794 Long term (current) use of insulin: Secondary | ICD-10-CM | POA: Diagnosis not present

## 2020-05-31 ENCOUNTER — Encounter: Payer: Medicare Other | Admitting: Physical Medicine and Rehabilitation

## 2020-06-07 ENCOUNTER — Other Ambulatory Visit: Payer: Self-pay

## 2020-06-07 ENCOUNTER — Encounter
Payer: Medicare Other | Attending: Physical Medicine and Rehabilitation | Admitting: Physical Medicine and Rehabilitation

## 2020-06-07 ENCOUNTER — Encounter: Payer: Self-pay | Admitting: Physical Medicine and Rehabilitation

## 2020-06-07 VITALS — BP 119/73 | HR 66 | Temp 98.8°F

## 2020-06-07 DIAGNOSIS — N1832 Chronic kidney disease, stage 3b: Secondary | ICD-10-CM | POA: Insufficient documentation

## 2020-06-07 DIAGNOSIS — M792 Neuralgia and neuritis, unspecified: Secondary | ICD-10-CM | POA: Insufficient documentation

## 2020-06-07 DIAGNOSIS — Z993 Dependence on wheelchair: Secondary | ICD-10-CM | POA: Insufficient documentation

## 2020-06-07 DIAGNOSIS — M4714 Other spondylosis with myelopathy, thoracic region: Secondary | ICD-10-CM | POA: Diagnosis not present

## 2020-06-07 DIAGNOSIS — R262 Difficulty in walking, not elsewhere classified: Secondary | ICD-10-CM | POA: Diagnosis not present

## 2020-06-07 MED ORDER — LIDOCAINE 5 % EX OINT: 1.0000 "application " | TOPICAL_OINTMENT | Freq: Four times a day (QID) | CUTANEOUS | 5 refills | Status: DC | PRN

## 2020-06-07 NOTE — Progress Notes (Signed)
Subjective:    Patient ID: Cassandra Stewart, female    DOB: [birth year], 70 y.o.   MRN: [id number]  HPI    Pt is a 70 yr old R handed female With hx of HTN, DM, HLD, Vit B levels are high- with hx of thoracic spondylosis/myelopathy and LE weakness- mainly LLE weak.   Here for f/u on thoracic myelopathy.     Back is really uncomfortable in current w/c and cannot be modified.  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  Occurred [place]- couldn't even get off bus the other day, because of W/C powering down for no reason.    Got new bed- herself that elevates legs and top - helps swelling  However has LE edema- variable- so when legs down for >3-4 hours, in w/c, her legs swell dramatically and needs to elevate.    Finger tip pain- pain is burning and pins and needles- R>L hand-  Also drop things from R hand a lot as well.   Uses Lidocaine/Aspercreme-thinks it's 4% lidocaine.    Has Dexcom- was told cannot take more than 1000 mg of tylenol in 1 day.  But based on renal labs, it appears it was 1000 mg/day of Ibuprofen.   Wants oto try Zachery Dakins- over the counter- has Vit B complex, and folic acid and alpha lipoic acid.   Pain Inventory Average Pain 5 Pain Right Now 7 My pain is na  LOCATION OF PAIN  Shoulder, back  BOWEL Number of stools per week: 3 Oral laxative use No  Type of laxative na Enema or suppository use No  History of colostomy No  Incontinent No   BLADDER Pads In and out cath, frequency na Able to self cath na Bladder incontinence No  Frequent urination No  Leakage with coughing No  Difficulty starting stream No  Incomplete bladder emptying No    Mobility ability to climb steps?  no do you drive?  no use a wheelchair  Function retired  Neuro/Psych weakness  Prior Studies Any changes since last visit?  no  Physicians involved in your care Any changes since last visit?   no   Family History  Problem Relation Age of Onset  . Diabetes Mother   . Heart disease Mother   . Kidney disease Brother        Two brothers on ESRD  . Pancreatic cancer Sister   . Amblyopia Neg Hx   . Blindness Neg Hx   . Cataracts Neg Hx   . Glaucoma Neg Hx   . Macular degeneration Neg Hx   . Retinal detachment Neg Hx   . Strabismus Neg Hx   . Retinitis pigmentosa Neg Hx   . Colon cancer Neg Hx   . Esophageal cancer Neg Hx    Social History   Socioeconomic History  . Marital status: Divorced    Spouse name: Not on file  . Number of children: Not on file  . Years of education: Not on file  . Highest education level: Not on file  Occupational History  . Not on file  Tobacco Use  . Smoking status: Never Smoker  . Smokeless tobacco: Never Used  Vaping Use  . Vaping Use: Never used  Substance and Sexual Activity  . Alcohol use: No  . Drug use: No  . Sexual activity: Not Currently  Other Topics Concern  . Not on file  Social History Narrative  .  Not on file   Social Determinants of Health   Financial Resource Strain: Not on file  Food Insecurity: Not on file  Transportation Needs: Not on file  Physical Activity: Not on file  Stress: Not on file  Social Connections: Not on file   Past Surgical History:  Procedure Laterality Date  . APPENDECTOMY  1969  . BREAST BIOPSY Right   . CATARACT EXTRACTION     May 86761  . LUMBAR DISC SURGERY    . LUMBAR LAMINECTOMY/DECOMPRESSION MICRODISCECTOMY N/A 07/06/2015   Procedure: Thoracic ten- eleven laminectomy for spinal canal decompression;  Surgeon: Coletta Memos, MD;  Location: [facility];  Service: Orthopedics;  Laterality: N/A;   Past Medical History:  Diagnosis Date  . Elevated LFTs   . Fatty liver    on [place].   Mar[place] Kitchen Hyperlipemia   . Hypertension   . Uncontrolled type 2 diabetes with neuropathy (HCC)   . Wheelchair bound    paraplegia of Leg - Left   BP 119/73   Pulse 66   Temp 98.8 F (37.1 C)   SpO2 96%    Opioid Risk Score:   Fall Risk Score:  `1  Depression screen PHQ 2/9  Depression screen [facility] 2/9 02/02/2020 11/03/2019 08/31/2019 08/04/2019 08/25/2018 11/05/2017 10/21/2017  Decreased Interest 0 0 0 0 0 0 0  Down, Depressed, Hopeless 0 0 0 0 0 3 0  PHQ - 2 Score 0 0 0 0 0 3 0   Review of Systems  Musculoskeletal: Positive for back pain.       Shoulder pain  All other systems reviewed and are negative.      Objective:   Physical Exam Awake, alert, appropriate, in power [employer], NAD 1+ edema but can tell has  less swelling due ot wooding of skin B/L  MS: RLE_ HF 4-/5, KE 4/5, DF and PF 4/5 LLE- HF 1/5, KE 3+/5, DF 4-/5 and PF 4-/5       Assessment & Plan:    Pt is a 70 yr old R handed female With hx of HTN, DM, HLD, Vit B levels are high- with hx of thoracic spondylosis/myelopathy and LE weakness- mainly LLE weak.   Here for f/u on thoracic myelopathy.   Appointment today to justify w/c- is appropriate as dx'd and detailed below.   1. Pt meets criteria for power w/c.  She has thoracic myelopathy so incomplete paraplegia- with inability to walk currently- she also is not able to do her own pressure relief, so cannot use a manual w/c. Current power [employer] continues to break down abruptly  And just stops working completely- as well as battery wearing out in such a short period of time, so keeps running out of power and then it will stop even when has a full bar of energy- basically said by [employer] cannot fix current w/c, so absolutely needs need get a new power w/c- also needs elevating leg rest due to variable LE edema; and tilt in space and recline for pressure relief,  so when legs down for >3 hours, in w/c, her legs swell dramatically and needs to elevate.  2.  [employer]- Can we get her a loaner w/c until gets new w/c.   3. No more than 2500 mg/day if taking daily-  Of TYLENOL or if doing as needed- no more than 3000 mg/day.   4. Lidocaine Ointment 5% up to 4x/day as  needed for finger  Nerve pain.    5. Can try  Nurvive - let me know how it works.    6. F/U in 2 months

## 2020-06-07 NOTE — Patient Instructions (Signed)
Pt is a 70 yr old R handed female With hx of HTN, DM, HLD, Vit B levels are high- with hx of thoracic spondylosis/myelopathy and LE weakness- mainly LLE weak.   Here for f/u on thoracic myelopathy.   Appointment today to justify w/c- is appropriate as dx'd and detailed below.   1. Pt meets criteria for power w/c.  She has thoracic myelopathy so incomplete paraplegia- with inability to walk currently- she also is not able to do her own pressure relief, so cannot use a manual w/c. Current power [employer] continues to break down abruptly  And just stops working completely- as well as battery wearing out in such a short period of time, so keeps running out of power and then it will stop even when has a full bar of energy- basically said by [employer] cannot fix current w/c, so absolutely needs need get a new power w/c- also needs elevating leg rest due to variable LE edema; and tilt in space and recline for pressure relief,  so when legs down for >3 hours, in w/c, her legs swell dramatically and needs to elevate.  2.  [employer]- Can we get her a loaner w/c until gets new w/c.   3. No more than 2500 mg/day if taking daily-  Of TYLENOL or if doing as needed- no more than 3000 mg/day.   4. Lidocaine Ointment 5% up to 4x/day as needed for finger  Nerve pain.    5. Can try Nurvive - let me know how it works.    6. F/U in 2 months

## 2020-06-14 ENCOUNTER — Other Ambulatory Visit: Payer: Self-pay | Admitting: Internal Medicine

## 2020-06-23 ENCOUNTER — Ambulatory Visit (INDEPENDENT_AMBULATORY_CARE_PROVIDER_SITE_OTHER): Payer: Medicare Other | Admitting: Gastroenterology

## 2020-06-23 ENCOUNTER — Encounter: Payer: Self-pay | Admitting: Gastroenterology

## 2020-06-23 VITALS — BP 134/60 | HR 64 | Ht 64.0 in | Wt 220.4 lb

## 2020-06-23 DIAGNOSIS — Z7409 Other reduced mobility: Secondary | ICD-10-CM | POA: Diagnosis not present

## 2020-06-23 DIAGNOSIS — K5900 Constipation, unspecified: Secondary | ICD-10-CM | POA: Diagnosis not present

## 2020-06-23 DIAGNOSIS — G8929 Other chronic pain: Secondary | ICD-10-CM

## 2020-06-23 DIAGNOSIS — R1032 Left lower quadrant pain: Secondary | ICD-10-CM | POA: Diagnosis not present

## 2020-06-23 DIAGNOSIS — K760 Fatty (change of) liver, not elsewhere classified: Secondary | ICD-10-CM

## 2020-06-23 MED ORDER — DICYCLOMINE HCL 10 MG PO CAPS
10.0000 mg | ORAL_CAPSULE | Freq: Three times a day (TID) | ORAL | 1 refills | Status: DC | PRN
Start: 2020-06-23 — End: 2022-06-28

## 2020-06-23 NOTE — Progress Notes (Signed)
HPI :  70 year old female here for a follow-up visit for abdominal pain and constipation.  I saw her initially on a virtual visit in July 2020 where she had explained her abdominal pain to me.  She has had ongoing left lower quadrant pain for years.  Previously intermittently but now more so chronic and has become a constant low-level discomfort that never really goes away.  At baseline it is rated 1-2 out of 10, has flares of discomfort that occur once to twice weekly.  There are a few things that can trigger her pain.  She states when she has the urge to have a bowel movement it can make it worse and then it is relieved with a bowel movement.  Sometimes eating can produce a bowel movement and she can have some pain after she eats.  Also certain movements can make this worse.  She states she has to avoid eating certain foods like gravy which can precipitate symptoms.  She has about 3 bowel movements per week, usually normal formed.  She does use MiraLAX as needed and that usually works for her if she needs to take something.  She denies any blood in her stools.  She has never had a prior colonoscopy.  No family history of colon cancer.  She is immobile and rarely walks, in the clinic in wheelchair today.  She has chronic back pain and has weakness of her left leg so she cannot walk well, her right leg has also become more weak recently as well.  At her last visit we had discussed options for her.  We decided pursue a CT scan as she was not interested in colonoscopy at that time due to logistics of doing that.  Her CT scan did not show any abnormalities done on September 22, 2018 to account for her symptoms.  There is no evidence of diverticulitis, no mass lesions in her colon that are obvious.  She has a small periumbilical and inguinal hernias bilaterally but her pain is not localized to these areas.  She states since last time I seen her she feels a bit better, pain is quite mild most of the time.  We  discussed if she wanted to do a colonoscopy.  She lives alone, think she would have a very difficult time with a bowel preparation, the same time does not want to be in the hospital to do this.  We discussed potential flex sig and if she would be able to handle that.  She is not sure if she could have a ride home if we were to do a procedure in the office, we also discussed doing an unsedated flexible sigmoidoscopy.  She otherwise has a history of fatty liver.  Her father had cirrhosis from alcoholism.  Her LFTs have been normal.  Her CT scan showed no fat in her liver that was overt, she had that noted on a prior ultrasound in 2017.  She has had 2 CT scans now in 2019 and 2020 without clear cause for her pain.  I had recommended using some Bentyl after our last visit but she states she never took that.  She has been using MiraLAX as needed but not regularly.  She has used some activity recently which she thinks may have helped She had negative stool testing in January and normal CBC at that time.   Prior workup: US abdomen 11/06/15 - no gallstones, fatty liver  CT renal stone study 11/23/17 - no acute abnormality  CT abdomen / pelvis 09/22/18 -  IMPRESSION: 1. No acute findings in the abdomen pelvis. 2. No diverticulosis. 3. Small bilateral fat filled inguinal hernias and small fat filled umbilical hernia. 4.  Mild aortic Atherosclerosis (ICD10-I70.0).  Stool for hemeoccult negative x 3 No anemia    Past Medical History:  Diagnosis Date  . Elevated LFTs   . Fatty liver    on [place].   Marland Kitchen Hyperlipemia   . Hypertension   . Inguinal hernia    2020 CT  . Stage 3b chronic kidney disease ([place])   . Thoracic spondylosis with myelopathy   . Umbilical hernia    8144 CT  . Uncontrolled type 2 diabetes with neuropathy ([place])   . Wheelchair bound    paraplegia of Leg - Left     Past Surgical History:  Procedure Laterality Date  . APPENDECTOMY  1969  . BREAST BIOPSY Right   . CATARACT  EXTRACTION     May 21016  . LUMBAR DISC SURGERY    . LUMBAR LAMINECTOMY/DECOMPRESSION MICRODISCECTOMY N/A 07/06/2015   Procedure: Thoracic ten- eleven laminectomy for spinal canal decompression;  Surgeon: Ashok Pall, MD;  Location: [facility];  Service: Orthopedics;  Laterality: N/A;   Family History  Problem Relation Age of Onset  . Diabetes Mother   . Heart disease Mother   . Kidney disease Brother        Two brothers on ESRD  . Pancreatic cancer Sister   . Amblyopia Neg Hx   . Blindness Neg Hx   . Cataracts Neg Hx   . Glaucoma Neg Hx   . Macular degeneration Neg Hx   . Retinal detachment Neg Hx   . Strabismus Neg Hx   . Retinitis pigmentosa Neg Hx   . Colon cancer Neg Hx   . Esophageal cancer Neg Hx    Social History   Tobacco Use  . Smoking status: Never Smoker  . Smokeless tobacco: Never Used  Vaping Use  . Vaping Use: Never used  Substance Use Topics  . Alcohol use: No  . Drug use: No   Current Outpatient Medications  Medication Sig Dispense Refill  . Accu-Chek FastClix Lancets MISC USE FOUR TIMES DAILY AS DIRECTED 200 each 3  . ACCU-CHEK GUIDE test strip USE TO TEST BLOOD SUGAR FOUR TIMES DAILY AS DIRECTED 400 strip 1  . acetaminophen (TYLENOL) 650 MG CR tablet Take 650 mg by mouth every 8 (eight) hours as needed for pain.    Marland Kitchen albuterol (VENTOLIN HFA) 108 (90 Base) MCG/ACT inhaler Inhale 1-2 puffs into the lungs every 4 (four) hours as needed for wheezing or shortness of breath.    Marland Kitchen amLODipine (NORVASC) 10 MG tablet TAKE 1 TABLET(10 MG) BY MOUTH EVERY MORNING 90 tablet 1  . aspirin EC 81 MG tablet Take 81 mg by mouth every evening.    . baclofen (LIORESAL) 10 MG tablet Take 0.5-1 tablets (5-10 mg total) by mouth at bedtime. For muscle spasms- at night. 30 each 5  . Blood Glucose Monitoring Suppl (ACCU-CHEK AVIVA PLUS) w/Device KIT Use as advised 1 kit 1  . Cholecalciferol (VITAMIN D PO) Take 1,000 mg by mouth daily.     . Cyanocobalamin (VITAMIN B 12 PO) Take  5,000 mg by mouth daily.    . Dulaglutide (TRULICITY) 3 YJ/8.5UD SOPN Inject 3 mg into the skin once a week. 6 mL 3  . gabapentin (NEURONTIN) 300 MG capsule TAKE 1 CAPSULE(300 MG) BY MOUTH THREE TIMES DAILY 270  capsule 1  . hydrochlorothiazide (MICROZIDE) 12.5 MG capsule TAKE 1 CAPSULE(12.5 MG) BY MOUTH DAILY 90 capsule 1  . insulin lispro (HUMALOG) 100 UNIT/ML KwikPen INJECT 7-10 UNITS UNDER THE SKIN 2-3 TIMES DAILY 30 mL 3  . Insulin Pen Needle (CAREFINE PEN NEEDLES) 32G X 4 MM MISC Use 4x a day 300 each 3  . lidocaine (XYLOCAINE) 5 % ointment Apply 1 application topically 4 (four) times daily as needed. For fingers tips pain 50 g 5  . lovastatin (MEVACOR) 40 MG tablet TAKE 1 TABLET(40 MG) BY MOUTH AT BEDTIME 90 tablet 1  . metFORMIN (GLUCOPHAGE-XR) 500 MG 24 hr tablet Take 1 tablet (500 mg total) by mouth 2 (two) times daily. 180 tablet 3  . nitroGLYCERIN (NITROSTAT) 0.4 MG SL tablet Place 1 tablet (0.4 mg total) under the tongue every 5 (five) minutes as needed for chest pain. 25 tablet 3  . polyethylene glycol (MIRALAX / GLYCOLAX) 17 g packet Take 17 g by mouth as needed.    . prednisoLONE acetate (PRED FORTE) 1 % ophthalmic suspension Place 1 drop into the left eye 4 (four) times daily. 10 mL 0  . pregabalin (LYRICA) 50 MG capsule Take 1 capsule (50 mg total) by mouth 2 (two) times daily. 14 capsule 0  . TOUJEO SOLOSTAR 300 UNIT/ML Solostar Pen Inject 28 Units into the skin at bedtime. 12 mL 3  . traMADol (ULTRAM) 50 MG tablet Take 1 tablet (50 mg total) by mouth every 6 (six) hours as needed. 28 tablet 0  . dicyclomine (BENTYL) 10 MG capsule Take 1 capsule (10 mg total) by mouth every 8 (eight) hours as needed for spasms. 60 capsule 1   No current facility-administered medications for this visit.   Allergies  Allergen Reactions  . Other     PT PREFERS TO NOT HAVE ANY NARCOTIC MEDICATIONS  . Chlorhexidine Rash    Burning after using CHG wipes-used for surgery  . Oxycodone Hcl      Other reaction(s): Hallucination Marked hallucinations and palpitations following dose of 5m on 04/30/2015      Review of Systems: All systems reviewed and negative except where noted in HPI.   Lab Results  Component Value Date   WBC 7.0 03/02/2020   HGB 12.7 03/02/2020   HCT 38.4 03/02/2020   MCV 86 03/02/2020   PLT 297 03/02/2020   Lab Results  Component Value Date   CREATININE 1.18 (H) 03/02/2020   BUN 19 03/02/2020   NA 142 03/02/2020   K 4.8 03/02/2020   CL 101 03/02/2020   CO2 26 03/02/2020    Lab Results  Component Value Date   ALT 34 (H) 03/02/2020   AST 34 03/02/2020   ALKPHOS 87 03/02/2020   BILITOT 0.3 03/02/2020     Physical Exam: BP 134/60   Pulse 64   Ht 5' 4"  (1.626 m)   Wt 220 lb 6.4 oz (100 kg)   BMI 37.83 kg/m  Constitutional: Pleasant,  female in no acute distress, sitting in wheelchair Abdominal: Soft, protuberant, some focal mild LLQ TTP . There are no masses palpable.  Extremities: no edema Neurological: Alert and oriented to person place and time. Psychiatric: Normal mood and affect. Behavior is normal.   ASSESSMENT AND PLAN: 70year old female here for reassessment of following:  Left lower quadrant pain Constipation Immobility Fatty liver  As above, years of ongoing left lower quadrant pain as described.  She has some level of discomfort all the time, can also be  precipitated with eating/bowel movements, and certain motions.  Reviewed her CT scans with her and her labs which are reassuring.  Her stool was negative for occult blood x3 and her CBC is normal.  Unclear if she is having bowel spasm causing gas or underlying musculoskeletal/pinched nerve which could be related.  She has never had a colonoscopy to make sure nothing else is going on in that area of her sigmoid colon, and it has been recommended to her in the past, the problem is her immobility will make a bowel prep very difficult and she has no one at home to help her get  through this.  If she were to have a full colonoscopy she would need to be admitted to the hospital for assistance with her prep, she would prefer to avoid doing that if at all possible.  I discussed potentially we could do a flexible sigmoidoscopy in the office, I do think she could handle preparing for that however unclear if she would be able to get a ride home from that if she anesthesia.  I discussed risk benefits of colonoscopy, flexible sigmoidoscopy, anesthesia, and discussed if she wanted to have any further work-up for this, which is namely endoscopic evaluation.  Given she is feeling a bit better since she was last seen she wants to hold off on endoscopic evaluation at this time, she understands the risks of doing this, and risks of not doing it which are namely missing polypoid or mass lesion etc.  I recommend she take MiraLAX daily to see if that may help her reduce constipation, and recommend she take the Bentyl as previously recommended 10 mg every 8 hours as needed.  We will see how she does with this, if symptoms worsen or persist she will let me know if she wants to consider colonoscopy.  Otherwise we reviewed her fatty liver, this appears stable, LFTs now normal and no steatosis reported on her last CT scans.  Plan: - offered colonoscopy / flex sig, discussed as above, patient declines for now given logistics of what this would entail - Miralax daily, titrate as needed - Bentyl 64m every 8 hours PRN - LFTs yearly  Call or follow up if symptoms worsen / persist  SCarolina Cellar MD [facility]

## 2020-06-23 NOTE — Patient Instructions (Signed)
We have sent the following medications to your pharmacy for you to pick up at your convenience:  Bentyl  Take Miralax daily

## 2020-06-26 ENCOUNTER — Telehealth: Payer: Self-pay | Admitting: *Deleted

## 2020-06-26 DIAGNOSIS — Z794 Long term (current) use of insulin: Secondary | ICD-10-CM | POA: Diagnosis not present

## 2020-06-26 DIAGNOSIS — E118 Type 2 diabetes mellitus with unspecified complications: Secondary | ICD-10-CM | POA: Diagnosis not present

## 2020-06-26 NOTE — Telephone Encounter (Signed)
Cassandra Stewart called to ask Dr Berline Chough if she has sent her papers to [employer] for wheelchair.  She reports that [employer] says they do not have them.

## 2020-06-26 NOTE — Telephone Encounter (Signed)
I filled them out last week- I don't know where they are- Can we check with front desk- ML

## 2020-06-28 ENCOUNTER — Telehealth: Payer: Self-pay | Admitting: Physical Medicine and Rehabilitation

## 2020-06-28 ENCOUNTER — Encounter (INDEPENDENT_AMBULATORY_CARE_PROVIDER_SITE_OTHER): Payer: Medicare Other | Admitting: Ophthalmology

## 2020-06-28 NOTE — Telephone Encounter (Signed)
Let patient know fax was sent to [employer] on 4/25 if they are needed a refax we can do that.  She will call [employer] and let us know

## 2020-07-04 NOTE — Progress Notes (Signed)
[facility] Note  07/05/2020     CHIEF COMPLAINT Patient presents for Retina Follow Up   HISTORY OF PRESENT ILLNESS: Cassandra Stewart is a 70 y.o. female who presents to the clinic today for:   HPI    Retina Follow Up    Patient presents with  Diabetic Retinopathy.  In both eyes.  This started 9 months ago.  I, the attending physician,  performed the HPI with the patient and updated documentation appropriately.          Comments    Patient here for 9 months retina follow up for NPDR OU. Patient states vision doing ok. No eye pain.  Has a lot of dry eyes in am.       Last edited by Bernarda Caffey, MD on 07/05/2020  1:50 PM. (History)    Pt states her vision is doing well, but her eyes are very dry, pt states her last A1c was 9, but she thinks it is better now, she has the Dexcom 6 and it has helped with her blood sugars a lot  Referring physician: Girtha Rm, NP-C [place],   [id number]  HISTORICAL INFORMATION:   Selected notes from the [facility] Referred by V. Raenette Rover, NP-C for DM exam LEE-  Ocular Hx- pseudophakia OU (2016, Dr. Candiss Norse at [facility]) [place]- DM2 on insulin, HTN,     CURRENT MEDICATIONS: Current Outpatient Medications (Ophthalmic Drugs)  Medication Sig  . prednisoLONE acetate (PRED FORTE) 1 % ophthalmic suspension Place 1 drop into the left eye 4 (four) times daily.   No current facility-administered medications for this visit. (Ophthalmic Drugs)   Current Outpatient Medications (Other)  Medication Sig  . Accu-Chek FastClix Lancets MISC USE FOUR TIMES DAILY AS DIRECTED  . ACCU-CHEK GUIDE test strip USE TO TEST BLOOD SUGAR FOUR TIMES DAILY AS DIRECTED  . acetaminophen (TYLENOL) 650 MG CR tablet Take 650 mg by mouth every 8 (eight) hours as needed for pain.  Marland Kitchen albuterol (VENTOLIN HFA) 108 (90 Base) MCG/ACT inhaler Inhale 1-2 puffs into the lungs every 4 (four) hours as needed for  wheezing or shortness of breath.  Marland Kitchen amLODipine (NORVASC) 10 MG tablet TAKE 1 TABLET(10 MG) BY MOUTH EVERY MORNING  . aspirin EC 81 MG tablet Take 81 mg by mouth every evening.  . baclofen (LIORESAL) 10 MG tablet Take 0.5-1 tablets (5-10 mg total) by mouth at bedtime. For muscle spasms- at night.  . Blood Glucose Monitoring Suppl (ACCU-CHEK AVIVA PLUS) w/Device KIT Use as advised  . Cholecalciferol (VITAMIN D PO) Take 1,000 mg by mouth daily.   . Cyanocobalamin (VITAMIN B 12 PO) Take 5,000 mg by mouth daily.  Marland Kitchen dicyclomine (BENTYL) 10 MG capsule Take 1 capsule (10 mg total) by mouth every 8 (eight) hours as needed for spasms.  . Dulaglutide (TRULICITY) 3 KV/4.2VZ SOPN Inject 3 mg into the skin once a week.  . gabapentin (NEURONTIN) 300 MG capsule TAKE 1 CAPSULE(300 MG) BY MOUTH THREE TIMES DAILY  . hydrochlorothiazide (MICROZIDE) 12.5 MG capsule TAKE 1 CAPSULE(12.5 MG) BY MOUTH DAILY  . insulin lispro (HUMALOG) 100 UNIT/ML KwikPen INJECT 7-10 UNITS UNDER THE SKIN 2-3 TIMES DAILY  . Insulin Pen Needle (CAREFINE PEN NEEDLES) 32G X 4 MM MISC Use 4x a day  . lidocaine (XYLOCAINE) 5 % ointment Apply 1 application topically 4 (four) times daily as needed. For fingers tips pain  . lovastatin (MEVACOR) 40 MG tablet TAKE 1  TABLET(40 MG) BY MOUTH AT BEDTIME  . metFORMIN (GLUCOPHAGE-XR) 500 MG 24 hr tablet Take 1 tablet (500 mg total) by mouth 2 (two) times daily.  . nitroGLYCERIN (NITROSTAT) 0.4 MG SL tablet Place 1 tablet (0.4 mg total) under the tongue every 5 (five) minutes as needed for chest pain.  . polyethylene glycol (MIRALAX / GLYCOLAX) 17 g packet Take 17 g by mouth as needed.  . pregabalin (LYRICA) 50 MG capsule Take 1 capsule (50 mg total) by mouth 2 (two) times daily.  Nelva Nay SOLOSTAR 300 UNIT/ML Solostar Pen Inject 28 Units into the skin at bedtime.  . traMADol (ULTRAM) 50 MG tablet Take 1 tablet (50 mg total) by mouth every 6 (six) hours as needed.   No current facility-administered  medications for this visit. (Other)      REVIEW OF SYSTEMS: ROS    Positive for: Musculoskeletal, Endocrine, Eyes   Negative for: Constitutional, Gastrointestinal, Neurological, Skin, Genitourinary, HENT, Cardiovascular, Respiratory, Psychiatric, Allergic/Imm, Heme/Lymph   Last edited by Theodore Demark, COA on 07/05/2020  1:16 PM. (History)       ALLERGIES Allergies  Allergen Reactions  . Other     PT PREFERS TO NOT HAVE ANY NARCOTIC MEDICATIONS  . Chlorhexidine Rash    Burning after using CHG wipes-used for surgery  . Oxycodone Hcl     Other reaction(s): Hallucination Marked hallucinations and palpitations following dose of 53m on 04/30/2015     PAST MEDICAL HISTORY Past Medical History:  Diagnosis Date  . Elevated LFTs   . Fat[place] liver    [place]   .Marland KitchenHyperlipemia   . Hypertension   . Inguinal hernia    2020 CT  . Stage 3b chronic kidney disease ([place]   . Thoracic spondylosis with myelopathy   . Umbilical hernia    27591CT  . Uncontrolled type 2 diabetes with neuropathy ([place]   . Wheelchair bound    paraplegia of Leg - Left   Past Surgical History:  Procedure Laterality Date  . APPENDECTOMY  1969  . BREAST BIOPSY Right   . CATARACT EXTRACTION     May 21016  . LUMBAR DISC SURGERY    . LUMBAR LAMINECTOMY/DECOMPRESSION MICRODISCECTOMY N/A 07/06/2015   Procedure: Thoracic ten- eleven laminectomy for spinal canal decompression;  Surgeon: KAshok Pall MD;  [facility];  Service: Orthopedics;  Laterality: N/A;    FAMILY HISTORY Family History  Problem Relation Age of Onset  . Diabetes Mother   . Heart disease Mother   . Kidney disease Brother        Two brothers on ESRD  . Pancreatic cancer Sister   . Amblyopia Neg Hx   . Blindness Neg Hx   . Cataracts Neg Hx   . Glaucoma Neg Hx   . Macular degeneration Neg Hx   . Retinal detachment Neg Hx   . Strabismus Neg Hx   . Retinitis pigmentosa Neg Hx   . Colon cancer Neg Hx   . Esophageal cancer  Neg Hx     SOCIAL HISTORY Social History   Tobacco Use  . Smoking status: Never Smoker  . Smokeless tobacco: Never Used  Vaping Use  . Vaping Use: Never used  Substance Use Topics  . Alcohol use: No  . Drug use: No         OPHTHALMIC EXAM:  Base Eye Exam    Visual Acuity (Snellen - Linear)      Right Left   Dist [place] 20/20  20/20       Tonometry (Tonopen, 1:13 PM)      Right Left   Pressure 19 18       Pupils      Dark Light Shape React APD   Right 2 1 Round Brisk None   Left 2 1 Round Brisk None       Visual Fields (Counting fingers)      Left Right    Full Full       Extraocular Movement      Right Left    Full, Ortho Full, Ortho       Neuro/Psych    Oriented x3: Yes   Mood/Affect: Normal       Dilation    Both eyes: 1.0% Mydriacyl, 2.5% Phenylephrine @ 1:13 PM        Slit Lamp and Fundus Exam    External Exam      Right Left   External Normal Normal       Slit Lamp Exam      Right Left   Lids/Lashes Dermatochalasis - upper lid, mild Meibomian gland dysfunction Dermatochalasis - upper lid   Conjunctiva/Sclera White and quiet Trace Melanosis   Cornea 1+ Punctate epithelial erosions, mild arcus; Well healed temporal cataract wounds, mild tear film debris 1+ central Punctate epithelial erosions, Arcus, Well healed temporal cataract wounds, decreased TBUT   Anterior Chamber Deep and quiet Deep and quiet   Iris Round and well dilated, No NVI Round and dilated, No NVI   Lens PC IOL in good postion, trace inferior Posterior capsular opacification PC IOL in good position with open PC   Vitreous Vitreous syneresis, Posterior vitreous detachment Vitreous syneresis, Posterior vitreous detachment       Fundus Exam      Right Left   Disc Pink and Sharp Mildly Tilted disc, Pink and Sharp   C/D Ratio 0.55 0.6   Macula Flat, Good foveal reflex, mild RPE mottling, No heme or edema Flat, Good foveal reflex, mild RPE mottling, rare MA, No edema   Vessels  Mild Vascular attenuation Vascular attenuation, mild tortuousity   Periphery Attached; rare scattered MA/DBH mostly posterior Attached; rare scattered MA/DBH mostly posterior          IMAGING AND PROCEDURES  Imaging and Procedures for 06/09/17  OCT, Retina - OU - Both Eyes       Right Eye Quality was good. Central Foveal Thickness: 270. Progression has been stable. Findings include normal foveal contour, no SRF, no IRF, myopic contour (No DME ).   Left Eye Quality was good. Central Foveal Thickness: 283. Progression has been stable. Findings include normal foveal contour, no SRF, no IRF (No DME).   Notes *Images captured and stored on drive  Diagnosis / Impression:  NFP, No IRF/SRF OU No frank DME OU  Clinical management:  See below  Abbreviations: NFP - Normal foveal profile. CME - cystoid macular edema. PED - pigment epithelial detachment. IRF - intraretinal fluid. SRF - subretinal fluid. EZ - ellipsoid zone. ERM - epiretinal membrane. ORA - outer retinal atrophy. ORT - outer retinal tubulation. SRHM - subretinal hyper-reflective material                  ASSESSMENT/PLAN:    ICD-10-CM   1. Mild nonproliferative diabetic retinopathy of both eyes without macular edema associated with type 2 diabetes mellitus ([place])  X79.3903   2. 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 Retinal edema  H35.81 OCT, Retina - OU - Both Eyes  4. Essential hypertension  I10   5. Hypertensive retinopathy of both eyes  H35.033   6. Posterior vitreous detachment of both eyes  H43.813   7. Pseudophakia of both eyes  Z96.1   8. PCO (posterior capsular opacification), left  H26.492     1-3. DM2 w/ mild non-proliferative diabetic retinopathy, OU  - The incidence, risk factors for progression, natural history and treatment options for diabetic retinopathy  were discussed with patient.    - The need for close monitoring of blood glucose, blood pressure, and serum lipids,  avoiding cigarette or any type of tobacco, and the need for long term follow up was also discussed with patient.  - exam with scattered MAs and improved CWSs OU  - OCT without clinically significant diabetic macular edema OU  - BCVA 20/20 OU  - f/u in 1 yr -- repeat DFE, OCT  4,5. Hypertensive retinopathy OU - mild  - discussed importance of tight BP control  - monitor  6. PVD / vitreous syneresis OU  - asymptomatic  - Discussed findings and prognosis  - No RT or RD on 360 peripheral exam  - Reviewed s/s of RT/RD  - strict return precautions for any such RT/RD symptoms  7. Pseudophakia OU  - s/p CE/IOL in 2016, [facility], Dr. Candiss Norse  - beautiful surgeries, doing well  - monitor  8. PCO OU  - s/p YAG OS (07.14.21)  - good opening, BCVA improved to 20/20 OS  - monitor   Ophthalmic Meds Ordered this visit:  No orders of the defined types were placed in this encounter.      Return in about 1 year (around 07/05/2021) for f/u NPDR OU, DFE, OCT.  There are no Patient Instructions on file for this visit.   Explained the diagnoses, plan, and follow up with the patient and they expressed understanding.  Patient expressed understanding of the importance of proper follow up care.   This document serves as a record of services personally performed by Gardiner Sleeper, MD, PhD. It was created on their behalf by Roselee Nova, COMT. The creation of this record is the provider's dictation and/or activities during the visit.  Electronically signed by: Roselee Nova, COMT 07/05/20 4:47 PM  This document serves as a record of services personally performed by Gardiner Sleeper, MD, PhD. It was created on their behalf by San Jetty. Owens Shark, OA an ophthalmic technician. The creation of this record is the provider's dictation and/or activities during the visit.    Electronically signed by: San Jetty. Owens Shark, [place] 05.11.2022 4:47 PM   Gardiner Sleeper, M.D., Ph.D. Diseases & Surgery of the Retina  and Vitreous [facility]  I have reviewed the above documentation for accuracy and completeness, and I agree with the above. Gardiner Sleeper, M.D., Ph.D. 07/05/20 4:47 PM   Abbreviations: M myopia (nearsighted); A astigmatism; H hyperopia (farsighted); P presbyopia; Mrx spectacle prescription;  CTL contact lenses; OD right eye; OS left eye; OU both eyes  XT exotropia; ET esotropia; PEK punctate epithelial keratitis; PEE punctate epithelial erosions; DES dry eye syndrome; MGD meibomian gland dysfunction; ATs artificial tears; PFAT's preservative free artificial tears; [place] nuclear sclerotic cataract; PSC posterior subcapsular cataract; ERM epi-retinal membrane; PVD posterior vitreous detachment; RD retinal detachment; DM diabetes mellitus; DR diabetic retinopathy; NPDR non-proliferative diabetic retinopathy; PDR proliferative diabetic retinopathy; CSME clinically significant macular edema; DME diabetic macular edema; dbh dot  blot hemorrhages; CWS cotton wool spot; POAG primary open angle glaucoma; C/D cup-to-disc ratio; HVF humphrey visual field; GVF goldmann visual field; OCT optical coherence tomography; IOP intraocular pressure; BRVO Branch retinal vein occlusion; CRVO central retinal vein occlusion; CRAO central retinal artery occlusion; BRAO branch retinal artery occlusion; RT retinal tear; SB scleral buckle; PPV pars plana vitrectomy; VH Vitreous hemorrhage; PRP panretinal laser photocoagulation; IVK intravitreal kenalog; VMT vitreomacular traction; MH Macular hole;  NVD neovascularization of the disc; NVE neovascularization elsewhere; AREDS age related eye disease study; ARMD age related macular degeneration; POAG primary open angle glaucoma; EBMD epithelial/anterior basement membrane dystrophy; ACIOL anterior chamber intraocular lens; IOL intraocular lens; PCIOL posterior chamber intraocular lens; Phaco/IOL phacoemulsification with intraocular lens placement; [place] photorefractive  keratectomy; LASIK laser assisted in situ keratomileusis; HTN hypertension; DM diabetes mellitus; COPD chronic obstructive pulmonary disease

## 2020-07-05 ENCOUNTER — Other Ambulatory Visit: Payer: Self-pay

## 2020-07-05 ENCOUNTER — Ambulatory Visit (INDEPENDENT_AMBULATORY_CARE_PROVIDER_SITE_OTHER): Payer: Medicare Other | Admitting: Ophthalmology

## 2020-07-05 ENCOUNTER — Encounter (INDEPENDENT_AMBULATORY_CARE_PROVIDER_SITE_OTHER): Payer: Self-pay | Admitting: Ophthalmology

## 2020-07-05 DIAGNOSIS — H35049 Retinal micro-aneurysms, unspecified, unspecified eye: Secondary | ICD-10-CM

## 2020-07-05 DIAGNOSIS — E11319 Type 2 diabetes mellitus with unspecified diabetic retinopathy without macular edema: Secondary | ICD-10-CM | POA: Diagnosis not present

## 2020-07-05 DIAGNOSIS — H3581 Retinal edema: Secondary | ICD-10-CM

## 2020-07-05 DIAGNOSIS — H43813 Vitreous degeneration, bilateral: Secondary | ICD-10-CM | POA: Diagnosis not present

## 2020-07-05 DIAGNOSIS — Z961 Presence of intraocular lens: Secondary | ICD-10-CM

## 2020-07-05 DIAGNOSIS — H35033 Hypertensive retinopathy, bilateral: Secondary | ICD-10-CM | POA: Diagnosis not present

## 2020-07-05 DIAGNOSIS — E113293 Type 2 diabetes mellitus with mild nonproliferative diabetic retinopathy without macular edema, bilateral: Secondary | ICD-10-CM

## 2020-07-05 DIAGNOSIS — H26492 Other secondary cataract, left eye: Secondary | ICD-10-CM | POA: Diagnosis not present

## 2020-07-05 DIAGNOSIS — I10 Essential (primary) hypertension: Secondary | ICD-10-CM

## 2020-07-20 ENCOUNTER — Telehealth: Payer: Self-pay | Admitting: Internal Medicine

## 2020-07-20 NOTE — Telephone Encounter (Signed)
[facility] is calling to ask for clinical notes and how and why the pt is doing adjustments  [facility] wiil be faxing over request   Fax--[phone]

## 2020-07-21 NOTE — Telephone Encounter (Signed)
Pt is calling to F/u on this message as well. Pt is also following up on needing a transmitter for the Dexcom G6

## 2020-07-25 NOTE — Telephone Encounter (Signed)
Called and spoke with Felisa Bonier; clinical notes faxed to [phone]. Advised any paperwork needing to be completed should be sent to [phone].

## 2020-07-27 DIAGNOSIS — Z794 Long term (current) use of insulin: Secondary | ICD-10-CM | POA: Diagnosis not present

## 2020-07-27 DIAGNOSIS — E118 Type 2 diabetes mellitus with unspecified complications: Secondary | ICD-10-CM | POA: Diagnosis not present

## 2020-08-04 ENCOUNTER — Encounter: Payer: Self-pay | Admitting: Internal Medicine

## 2020-08-04 ENCOUNTER — Ambulatory Visit (INDEPENDENT_AMBULATORY_CARE_PROVIDER_SITE_OTHER): Payer: Medicare Other | Admitting: Internal Medicine

## 2020-08-04 ENCOUNTER — Other Ambulatory Visit: Payer: Self-pay

## 2020-08-04 VITALS — BP 128/78 | HR 71 | Ht 64.0 in | Wt 217.0 lb

## 2020-08-04 DIAGNOSIS — E1140 Type 2 diabetes mellitus with diabetic neuropathy, unspecified: Secondary | ICD-10-CM | POA: Diagnosis not present

## 2020-08-04 DIAGNOSIS — IMO0002 Reserved for concepts with insufficient information to code with codable children: Secondary | ICD-10-CM

## 2020-08-04 DIAGNOSIS — E1165 Type 2 diabetes mellitus with hyperglycemia: Secondary | ICD-10-CM

## 2020-08-04 DIAGNOSIS — E782 Mixed hyperlipidemia: Secondary | ICD-10-CM

## 2020-08-04 DIAGNOSIS — E538 Deficiency of other specified B group vitamins: Secondary | ICD-10-CM

## 2020-08-04 LAB — POCT GLYCOSYLATED HEMOGLOBIN (HGB A1C): Hemoglobin A1C: 8.5 % — AB (ref 4.0–5.6)

## 2020-08-04 MED ORDER — TRULICITY 3 MG/0.5ML ~~LOC~~ SOAJ: 3.0000 mg | SUBCUTANEOUS | 3 refills | Status: DC

## 2020-08-04 MED ORDER — TOUJEO SOLOSTAR 300 UNIT/ML ~~LOC~~ SOPN: 40.0000 [IU] | PEN_INJECTOR | Freq: Every day | SUBCUTANEOUS | 3 refills | Status: DC

## 2020-08-04 NOTE — Patient Instructions (Addendum)
Please continue: - Metformin ER 500 mg 1-2x a day with meals. - Trulicity 3 mg weekly - Humalog 7-12 units before the meals, add 5 units before the snack at night   Increase: - Toujeo to 40 units at night  Please continue B12 1000 mcg every other day.  Please return in 4 months.

## 2020-08-04 NOTE — Progress Notes (Signed)
Patient ID: Cassandra Stewart, female   DOB: 10-26-1950, 70 y.o.   MRN: [id number]   This visit occurred during the SARS-CoV-2 public health emergency.  Safety protocols were in place, including screening questions prior to the visit, additional usage of staff PPE, and extensive cleaning of exam room while observing appropriate contact time as indicated for disinfecting solutions.   HPI: Cassandra Stewart is a 70 y.o.-year-old female, returning for follow-up for DM2, dx in 1992, insulin-dependent since 2006, uncontrolled, with complications (CKD stage 3, PN, DR).  Last visit 4 months ago  Interim history: No increased urination, blurry vision, nausea, chest pain. She feels that her sugars improved after she started the Dexcom CGM, which she loves.   Reviewed HbA1c levels: Lab Results  Component Value Date   HGBA1C 9.9 (A) 04/06/2020   HGBA1C 7.4 (A) 12/02/2019   HGBA1C 9.3 (A) 07/29/2019   HGBA1C 9.6 (A) 03/30/2019   HGBA1C 9.8 (A) 12/28/2018   HGBA1C 10.7 (A) 07/13/2018   HGBA1C 6.8 (A) 03/16/2018   HGBA1C 6.1 (A) 11/12/2017   HGBA1C 9.0 07/11/2017   HGBA1C 9.0 04/14/2017   HGBA1C 8.7% 10/24/2016   HGBA1C 9.6% 07/25/2016   HGBA1C 10.5% 03/04/2016   HGBA1C 8.1% 10/25/2015   HGBA1C 9.7 (H) 07/04/2015  03/16/2018: HbA1c calculated from fructosamine 6.28%  Pt was on a regimen of: - Metformin 500 mg 1x a day with dinner.  She had diarrhea with a higher dose. - Toujeo 45 units in am - Humalog 18 units 2-3x a day, before meals Tried: Actos, Lantus.  Now on: - Metformin ER 500 mg 1-2x a day with meals. - Trulicity 1.5 >> 3 mg weekly -restarted at last visit - Toujeo 35 >> 28 >> 36 units at night - Humalog 7-12 units before meals  At last visit, she was not checking sugars, but since then, she was able to start the Dexcom CGM-now check sugars more than 4 times a day:    Lowest sugar was 60s >> 117 >> 67 >> 49 (took insulin but forgot to eat); she has hypoglycemia awareness in  the 80s. Highest sugar was 188 >> 383 >> 143 >> 296.  Glucometer: Freestyle  Pt's meals are: - Breakfast/brunch: egg, bacon, toast >> no b'fast now - Lunch: snack mostly (no Humalog) >> Meals on Wheels - Dinner: chicken/fish + vegetables >> moved before 7 pm - Snacks: Belvita cracker if hungry  -+ Stage III CKD; last BUN/creatinine:  Lab Results  Component Value Date   BUN 19 03/02/2020   BUN 18 08/31/2019   CREATININE 1.18 (H) 03/02/2020   CREATININE 1.09 (H) 08/31/2019   Lab Results  Component Value Date   GFRAA 54 (L) 03/02/2020   GFRAA 59 (L) 08/31/2019   GFRAA 58 (L) 03/24/2019   GFRAA 59 (L) 03/12/2019   GFRAA 55 (L) 03/08/2019  Previously on lisinopril 10, but stopped due to hyperkalemia.  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 Coralyn Pear).  She has history of cataract surgery OU. -+ Numbness and tingling in her fingers-on Neurontin On ASA 81.  Low vitamin B12:  Reviewed B12 levels: Lab Results  Component Value Date   VITAMINB12 >1526 (H) 07/29/2019   VITAMINB12 >1500 (H) 03/30/2019  VITAMINB12 >1500 (H) 12/28/2018   VITAMINB12 261 03/16/2018   VITAMINB12 300 04/14/2017   VITAMINB12 478 10/25/2015  05/01/2016: Vit B12 248.  We initially started 5000 mcg B12 daily, which was then decreased to 2500 mcg daily and, then to 1000 mcg daily.  Currently on 1000 mcg every other day. She continues to be in a wheelchair as her left leg is very weak-believed to be from a herniated intervertebral disc.  She had surgery for this in 2016 but the strength did not improve.  She has an extensive FH of heart disease - women in her family. Daughter died after her heart stopped.  ROS: Constitutional: no weight gain/no weight loss, no fatigue, no subjective  hyperthermia, no subjective hypothermia Eyes: no blurry vision, no xerophthalmia ENT: no sore throat, no nodules palpated in neck, no dysphagia, no odynophagia, no hoarseness Cardiovascular: no CP/no SOB/no palpitations/+ leg swelling Respiratory: no cough/no SOB/no wheezing Gastrointestinal: no N/no V/no D/no C/no acid reflux Musculoskeletal: no muscle aches/no joint aches Skin: no rashes, no hair loss Neurological: no tremors/+ numbness/+ tingling-fingers/no dizziness  I reviewed pt's medications, allergies, PMH, social hx, family hx, and changes were documented in the history of present illness. Otherwise, unchanged from my initial visit note.  Past Medical History:  Diagnosis Date   Elevated LFTs    Fatty liver    on [place].    Hyperlipemia    Hypertension    Inguinal hernia    2020 CT   Stage 3b chronic kidney disease ([place])    Thoracic spondylosis with myelopathy    Umbilical hernia    6063 CT   Uncontrolled type 2 diabetes with neuropathy [facility])    Wheelchair bound    paraplegia of Leg - Left   Past Surgical History:  Procedure Laterality Date   APPENDECTOMY  1969   BREAST BIOPSY Right    CATARACT EXTRACTION     May 21016   LUMBAR DISC SURGERY     LUMBAR LAMINECTOMY/DECOMPRESSION MICRODISCECTOMY N/A 07/06/2015   Procedure: Thoracic ten- eleven laminectomy for spinal canal decompression;  Surgeon: Ashok Pall, MD;  Location: [facility];  Service: Orthopedics;  Laterality: N/A;   Social History   Social History   Marital status: Divorced    Spouse name: N/A   Number of children: 0   Occupational History   None   Social History Main Topics   Smoking status: Never Smoker   Smokeless tobacco: Never Used   Alcohol use No   Drug use: No   Current Outpatient Medications on File Prior to Visit  Medication Sig Dispense Refill   Accu-Chek FastClix Lancets MISC USE FOUR TIMES DAILY AS DIRECTED 200 each 3   ACCU-CHEK GUIDE test strip USE TO TEST BLOOD SUGAR FOUR TIMES  DAILY AS DIRECTED 400 strip 1   acetaminophen (TYLENOL) 650 MG CR tablet Take 650 mg by mouth every 8 (eight) hours as needed for pain.     albuterol (VENTOLIN HFA) 108 (90 Base) MCG/ACT inhaler Inhale 1-2 puffs into the lungs every 4 (four) hours as needed for wheezing or shortness of breath.     amLODipine (NORVASC) 10 MG tablet TAKE 1 TABLET(10 MG) BY MOUTH EVERY MORNING 90 tablet 1   aspirin EC 81 MG tablet Take 81 mg by mouth every evening.     baclofen (LIORESAL) 10 MG tablet Take 0.5-1 tablets (5-10 mg total) by mouth at bedtime. For muscle spasms- at night. 30 each 5   Blood Glucose Monitoring Suppl (ACCU-CHEK AVIVA  PLUS) w/Device KIT Use as advised 1 kit 1   Cholecalciferol (VITAMIN D PO) Take 1,000 mg by mouth daily.      Cyanocobalamin (VITAMIN B 12 PO) Take 5,000 mg by mouth daily.     dicyclomine (BENTYL) 10 MG capsule Take 1 capsule (10 mg total) by mouth every 8 (eight) hours as needed for spasms. 60 capsule 1   Dulaglutide (TRULICITY) 3 CZ/6.6AY SOPN Inject 3 mg into the skin once a week. 6 mL 3   gabapentin (NEURONTIN) 300 MG capsule TAKE 1 CAPSULE(300 MG) BY MOUTH THREE TIMES DAILY 270 capsule 1   hydrochlorothiazide (MICROZIDE) 12.5 MG capsule TAKE 1 CAPSULE(12.5 MG) BY MOUTH DAILY 90 capsule 1   insulin lispro (HUMALOG) 100 UNIT/ML KwikPen INJECT 7-10 UNITS UNDER THE SKIN 2-3 TIMES DAILY 30 mL 3   Insulin Pen Needle (CAREFINE PEN NEEDLES) 32G X 4 MM MISC Use 4x a day 300 each 3   lidocaine (XYLOCAINE) 5 % ointment Apply 1 application topically 4 (four) times daily as needed. For fingers tips pain 50 g 5   lovastatin (MEVACOR) 40 MG tablet TAKE 1 TABLET(40 MG) BY MOUTH AT BEDTIME 90 tablet 1   metFORMIN (GLUCOPHAGE-XR) 500 MG 24 hr tablet Take 1 tablet (500 mg total) by mouth 2 (two) times daily. 180 tablet 3   nitroGLYCERIN (NITROSTAT) 0.4 MG SL tablet Place 1 tablet (0.4 mg total) under the tongue every 5 (five) minutes as needed for chest pain. 25 tablet 3   polyethylene  glycol (MIRALAX / GLYCOLAX) 17 g packet Take 17 g by mouth as needed.     prednisoLONE acetate (PRED FORTE) 1 % ophthalmic suspension Place 1 drop into the left eye 4 (four) times daily. 10 mL 0   pregabalin (LYRICA) 50 MG capsule Take 1 capsule (50 mg total) by mouth 2 (two) times daily. 14 capsule 0   TOUJEO SOLOSTAR 300 UNIT/ML Solostar Pen Inject 28 Units into the skin at bedtime. 12 mL 3   traMADol (ULTRAM) 50 MG tablet Take 1 tablet (50 mg total) by mouth every 6 (six) hours as needed. 28 tablet 0   No current facility-administered medications on file prior to visit.   Allergies  Allergen Reactions   Other     PT PREFERS TO NOT HAVE ANY NARCOTIC MEDICATIONS   Chlorhexidine Rash    Burning after using CHG wipes-used for surgery   Oxycodone Hcl     Other reaction(s): Hallucination Marked hallucinations and palpitations following dose of 66m on 04/30/2015    Family History  Problem Relation Age of Onset   Diabetes Mother    Heart disease Mother    Kidney disease Brother        Two brothers on ESRD   Pancreatic cancer Sister    Amblyopia Neg Hx    Blindness Neg Hx    Cataracts Neg Hx    Glaucoma Neg Hx    Macular degeneration Neg Hx    Retinal detachment Neg Hx    Strabismus Neg Hx    Retinitis pigmentosa Neg Hx    Colon cancer Neg Hx    Esophageal cancer Neg Hx    Pt has FH of DM in M, MGM, PGM, M aunt, uncles.  PE: BP 128/78 (BP Location: Right Arm, Patient Position: Sitting, Cuff Size: Normal)   Pulse 71   Ht _0  (1.626 m)   Wt 217 lb (98.4 kg)   SpO2 98%   BMI 37.25 kg/m  Wt Readings from Last  3 Encounters:  08/04/20 217 lb (98.4 kg)  06/23/20 220 lb 6.4 oz (100 kg)  04/06/20 218 lb 12.8 oz (99.2 kg)   Constitutional: overweight, in NAD, in wheelchair Eyes: PERRLA, EOMI, no exophthalmos ENT: moist mucous membranes, no thyromegaly, no cervical lymphadenopathy Cardiovascular: RRR, No MRG, + L ankle edema - pitting after fall 2017 Respiratory: CTA  B Gastrointestinal: abdomen soft, NT, ND, BS+ Musculoskeletal: no deformities, strength intact in all 4 Skin: moist, warm, no rashes Neurological: no tremor with outstretched hands, DTR normal in all 4  ASSESSMENT: 1. DM2, insulin-dependent, uncontrolled, with complications - CKD stage 3 - PN - DR  - Of note, we tried to prescribe the Freestyle libre CGM for her but this was not covered by the insurance  2. Low B12  3. HL  PLAN:  1. Patient with longstanding, type 2 diabetes, on oral antidiabetic regimen and also basal-bolus insulin and weekly GLP-1 receptor agonist, with still poor control.  At last visit, after running out of Trulicity and coming off a diet that caused her initially to lose 40 pounds and to improve her sugars significantly, her sugars were much higher and HbA1c was 9.9%.  We increased her Toujeo dose and restarted Trulicity.  She was planning to go back to the previous diet. -At last visit she had a Dexcom CGM but I had her see the diabetes educator to attach it.  She was not on the sensor the entire period since last visit due to the fact that she accidentally threw away the transmitter.  However, she is now back on it. CGM interpretation: -At today's visit, we reviewed her CGM downloads: It appears that 51.5% of values are in target range (goal >70%), while 48.5% are higher than 180 (goal <25%), and 0% are lower than 70 (goal <4%).  The calculated average blood sugar is 180.  The projected HbA1c for the next 3 months ([facility]) appears to be 8.0%. -Reviewing the CGM trends, it appears that sugars are hovering around the upper limit of the normal range at all times of the day.  They are slightly higher after lunch and after dinner and also has a second hypoglycemic peak at that time.  Upon questioning, she is having a snack after dinner which she is not covering with insulin.  We discussed that ideally she would stop the snack to prevent the sugars staying high throughout the  night but if not, she will need to take a lower dose of Humalog for it.  We will also increase the Toujeo dose at this visit to hopefully bring all of her blood sugars lower. - I suggested to:  Patient Instructions  Please continue: - Metformin ER 500 mg 1-2x a day with meals. - Trulicity 3 mg weekly - Humalog 7-12 units before the meals, add 5 units before the snack at night   Increase: - Toujeo to 40 units at night  Please continue B12 1000 mcg every other day.  Please return in 4 months.   - we checked her HbA1c: 8.5% (lower) - advised to check sugars at different times of the day - 4x a day, rotating check times - advised for yearly eye exams >> she is UTD - return to clinic in 4 months  2. Low B12 -She has a history of low B12, and we started supplementation with p.o. supplement -She is not taking 1000 mcg every other day -We will recheck a level at next visit with PCP next month  3. HL -  Reviewed latest lipid panel from 08/2019: Excellent lipid fractions: Lab Results  Component Value Date   CHOL 152 08/31/2019   HDL 64 08/31/2019   LDLCALC 67 08/31/2019   TRIG 121 08/31/2019   CHOLHDL 2.4 08/31/2019  -Continues Mevacor 40 without side effects   Philemon Kingdom, MD PhD [facility]

## 2020-08-09 ENCOUNTER — Encounter: Payer: Self-pay | Admitting: Physical Medicine and Rehabilitation

## 2020-08-09 ENCOUNTER — Encounter
Payer: Medicare Other | Attending: Physical Medicine and Rehabilitation | Admitting: Physical Medicine and Rehabilitation

## 2020-08-09 ENCOUNTER — Other Ambulatory Visit: Payer: Self-pay

## 2020-08-09 VITALS — BP 125/68 | HR 70 | Temp 98.4°F | Ht 64.0 in | Wt 217.0 lb

## 2020-08-09 DIAGNOSIS — Z993 Dependence on wheelchair: Secondary | ICD-10-CM

## 2020-08-09 DIAGNOSIS — M4714 Other spondylosis with myelopathy, thoracic region: Secondary | ICD-10-CM | POA: Diagnosis not present

## 2020-08-09 DIAGNOSIS — M792 Neuralgia and neuritis, unspecified: Secondary | ICD-10-CM | POA: Diagnosis not present

## 2020-08-09 DIAGNOSIS — M4804 Spinal stenosis, thoracic region: Secondary | ICD-10-CM

## 2020-08-09 NOTE — Progress Notes (Signed)
Subjective:    Patient ID: Cassandra Stewart, female    DOB: 25-Jul-1950, 70 y.o.   MRN: [id number]  HPI  Pt is a 70 yr old R handed female  With hx of HTN, DM- poorly controlled per chart; , HLD, Vit B levels are high- with hx of thoracic spondylosis/myelopathy and LE weakness- mainly LLE weak.  Here for f/u on weakness.   W/C- insurance has approved the w/c- the copay- is the problem.  [phone]- doesn't have it.  Filed through hardship via [employer]- doesn't know how will work.   Is in pain from current w/c.  But also not a fan of taking pain meds- would rather take tylenol.   They told her could finance for 6 months.  $350/month- but doesn't have it-  Still doesn't have $175 for 12 months.  Putting food on table only at the end of month.  Filled out hardship paperwork on first day.   Using loaner from [employer]- moves on own.  Tech came out Tuesday.  Will "look into it".    Lidocaine ointment works somewhat for hand/fingers- esp at night/waking up in AM.    Pain Inventory Average Pain 5 Pain Right Now 7 My pain is  na  LOCATION OF PAIN  shoulder back  BOWEL Number of stools per week: 3 Oral laxative use No  Type of laxative miralax Enema or suppository use No  History of colostomy No  Incontinent No   BLADDER Normal    Mobility ability to climb steps?  no do you drive?  no use a wheelchair  Function retired  Neuro/Psych weakness numbness  Prior Studies Any changes since last visit?  no  Physicians involved in your care Any changes since last visit?  no   Family History  Problem Relation Age of Onset   Diabetes Mother    Heart disease Mother    Kidney disease Brother        Two brothers on ESRD   Pancreatic cancer Sister    Amblyopia Neg Hx    Blindness Neg Hx    Cataracts Neg Hx    Glaucoma Neg Hx    Macular degeneration Neg Hx    Retinal detachment Neg Hx    Strabismus Neg Hx    Retinitis pigmentosa Neg Hx    Colon cancer Neg Hx     Esophageal cancer Neg Hx    Social History   Socioeconomic History   Marital status: Divorced    Spouse name: Not on file   Number of children: Not on file   Years of education: Not on file   Highest education level: Not on file  Occupational History   Not on file  Tobacco Use   Smoking status: Never   Smokeless tobacco: Never  Vaping Use   Vaping Use: Never used  Substance and Sexual Activity   Alcohol use: No   Drug use: No   Sexual activity: Not Currently  Other Topics Concern   Not on file  Social History Narrative   Not on file   Social Determinants of Health   Financial Resource Strain: Not on file  Food Insecurity: Not on file  Transportation Needs: Not on file  Physical Activity: Not on file  Stress: Not on file  Social Connections: Not on file   Past Surgical History:  Procedure Laterality Date   APPENDECTOMY  1969   BREAST BIOPSY Right    CATARACT EXTRACTION     May 56256   LUMBAR  DISC SURGERY     LUMBAR LAMINECTOMY/DECOMPRESSION MICRODISCECTOMY N/A 07/06/2015   Procedure: Thoracic ten- eleven laminectomy for spinal canal decompression;  Surgeon: Coletta Memos, MD;  Location: [facility];  Service: Orthopedics;  Laterality: N/A;   Past Medical History:  Diagnosis Date   Elevated LFTs    Fatty liver    on [place].    Hyperlipemia    Hypertension    Inguinal hernia    2020 CT   Stage 3b chronic kidney disease ([facility])    Thoracic spondylosis with myelopathy    Umbilical hernia    2020 CT   Uncontrolled type 2 diabetes with neuropathy ([facility])    Wheelchair bound    paraplegia of Leg - Left   BP 125/68   Pulse 70   Temp 98.4 F (36.9 C)   Ht 5\' 4"  (1.626 m)   Wt 217 lb (98.4 kg) Comment: last recorded  SpO2 94%   BMI 37.25 kg/m   Opioid Risk Score:   Fall Risk Score:  `1  Depression screen PHQ 2/9  Depression screen [facility] 2/9 08/09/2020 02/02/2020 11/03/2019 08/31/2019 08/04/2019 08/25/2018 11/05/2017  Decreased Interest 0 0 0 0 0 0 0  Down, Depressed,  Hopeless 0 0 0 0 0 0 3  PHQ - 2 Score 0 0 0 0 0 0 3     Review of Systems  Constitutional: Negative.   HENT: Negative.    Eyes: Negative.   Respiratory: Negative.    Cardiovascular: Negative.   Gastrointestinal: Negative.   Endocrine: Negative.   Genitourinary: Negative.   Musculoskeletal:  Positive for back pain.       Shoulder  Skin: Negative.   Allergic/Immunologic: Negative.   Neurological: Negative.   Hematological: Negative.   Psychiatric/Behavioral: Negative.    All other systems reviewed and are negative.     Objective:   Physical Exam  Awake alert, appropriate, in power w/c, NAD No change in strength since elast visit.  LLE- HF 1/5, KE 3+/5, DF 4-5 and PF 4-/5 RLE_ HF 4-/5, KE 4/5, DF and PF 4/5 Trigger point in  L biceps.triceps- that's palpable.     Assessment & Plan:   Pt is a 70 yr old R handed female  With hx of HTN, DM- poorly controlled per chart; , HLD, Vit B levels are high- with hx of thoracic spondylosis/myelopathy and LE weakness- mainly LLE weak.  Here for f/u on weakness.   Last w/c was cutting off and leaving stranded- current loaner w/c keeps turning itself on; moving- literally on its own.  Last w/c was 5+ years ago. Needs new w/c- justified 06/07/20- in depth- her w/c  Doesn't want even tramadol for back pain. Doesn't want more than tylenol.  Can use lidocaine ointment- up to 6x/day on hand/fingers.  Would take Alleve 1x/day no more- due to kidney issues. Cr 1.18- because of DM at risk for kidney damage.  Have left message for 06/09/20 company- [employer]- will see if can use her to get pt new w/c.  F/u 2 months

## 2020-08-09 NOTE — Patient Instructions (Signed)
Pt is a 70 yr old R handed female  With hx of HTN, DM- poorly controlled per chart; , HLD, Vit B levels are high- with hx of thoracic spondylosis/myelopathy and LE weakness- mainly LLE weak.  Here for f/u on weakness.   Last w/c was cutting off and leaving stranded- current loaner w/c keeps turning itself on; moving- literally on its own.  Last w/c was 5+ years ago. Needs new w/c- justified 06/07/20- in depth- her w/c  Doesn't want even tramadol for back pain. Doesn't want more than tylenol.  Can use lidocaine ointment- up to 6x/day on hand/fingers.  Would take Alleve 1x/day no more- due to kidney issues. Cr 1.18- because of DM at risk for kidney damage.  Have left message for [employer] company- Stephannie Peters- will see if can use her to get pt new w/c.  F/u 2 months

## 2020-08-16 ENCOUNTER — Telehealth: Payer: Self-pay | Admitting: *Deleted

## 2020-08-16 NOTE — Telephone Encounter (Signed)
Cassandra Stewart called because Dr Berline Chough asked her to cal if she has not heard form the [facility] company by today.  She has not heard from them and this is to let Dr Berline Chough know.

## 2020-08-26 DIAGNOSIS — E118 Type 2 diabetes mellitus with unspecified complications: Secondary | ICD-10-CM | POA: Diagnosis not present

## 2020-08-26 DIAGNOSIS — Z794 Long term (current) use of insulin: Secondary | ICD-10-CM | POA: Diagnosis not present

## 2020-08-31 ENCOUNTER — Ambulatory Visit: Payer: Medicare Other | Admitting: Family Medicine

## 2020-09-12 ENCOUNTER — Other Ambulatory Visit: Payer: Self-pay | Admitting: Family Medicine

## 2020-09-12 DIAGNOSIS — I10 Essential (primary) hypertension: Secondary | ICD-10-CM

## 2020-09-12 DIAGNOSIS — G959 Disease of spinal cord, unspecified: Secondary | ICD-10-CM | POA: Diagnosis not present

## 2020-09-12 DIAGNOSIS — E782 Mixed hyperlipidemia: Secondary | ICD-10-CM

## 2020-09-12 DIAGNOSIS — L98429 Non-pressure chronic ulcer of back with unspecified severity: Secondary | ICD-10-CM | POA: Diagnosis not present

## 2020-09-12 DIAGNOSIS — R262 Difficulty in walking, not elsewhere classified: Secondary | ICD-10-CM | POA: Diagnosis not present

## 2020-09-12 DIAGNOSIS — M4710 Other spondylosis with myelopathy, site unspecified: Secondary | ICD-10-CM | POA: Diagnosis not present

## 2020-09-26 DIAGNOSIS — E118 Type 2 diabetes mellitus with unspecified complications: Secondary | ICD-10-CM | POA: Diagnosis not present

## 2020-09-26 DIAGNOSIS — Z794 Long term (current) use of insulin: Secondary | ICD-10-CM | POA: Diagnosis not present

## 2020-10-16 ENCOUNTER — Other Ambulatory Visit: Payer: Self-pay

## 2020-10-16 ENCOUNTER — Encounter: Payer: Self-pay | Admitting: Physical Medicine and Rehabilitation

## 2020-10-16 ENCOUNTER — Encounter
Payer: Medicare Other | Attending: Physical Medicine and Rehabilitation | Admitting: Physical Medicine and Rehabilitation

## 2020-10-16 VITALS — BP 128/75 | HR 76 | Temp 97.8°F | Ht 64.0 in

## 2020-10-16 DIAGNOSIS — M7989 Other specified soft tissue disorders: Secondary | ICD-10-CM | POA: Insufficient documentation

## 2020-10-16 DIAGNOSIS — M4714 Other spondylosis with myelopathy, thoracic region: Secondary | ICD-10-CM | POA: Diagnosis not present

## 2020-10-16 DIAGNOSIS — Z993 Dependence on wheelchair: Secondary | ICD-10-CM | POA: Insufficient documentation

## 2020-10-16 DIAGNOSIS — M25531 Pain in right wrist: Secondary | ICD-10-CM | POA: Diagnosis not present

## 2020-10-16 DIAGNOSIS — M4804 Spinal stenosis, thoracic region: Secondary | ICD-10-CM | POA: Diagnosis not present

## 2020-10-16 MED ORDER — TRAMADOL HCL 50 MG PO TABS: 50.0000 mg | ORAL_TABLET | Freq: Four times a day (QID) | ORAL | 0 refills | Status: DC | PRN

## 2020-10-16 NOTE — Progress Notes (Signed)
Subjective:    Patient ID: Cassandra Stewart, female    DOB: 01/08/1951, 70 y.o.   MRN: [id number]  HPI   Pt is a 70 yr old R handed female  With hx of HTN, DM- poorly controlled per chart; , HLD, Vit B levels are high- with hx of thoracic spondylosis/myelopathy and LE weakness- mainly LLE weak.  Here for f/u on weakness.   Got new w/c- power w/c- had 2 months.   R wrist is hurting really bad.  R wrist is swelling, point tender on R ulnar styloid- and will shoot pain down to R elbow.   Pain is 9/10- it hurts so bad.  Wearing lidocaine patches on it- and lidocaine ointment- helps a little-  Helps her "get through".   Was told her new w/c has  2 bad motors in "new w/c".  Because was "whistling"- and needed replacement- replacing motor this upcoming week.    Swelling in L foot- getting worse.   Pain Inventory Average Pain 9 Pain Right Now 9 My pain is constant and aching  LOCATION OF PAIN  Right wrist pain  & swelling  BOWEL Number of stools per week: 2  Oral laxative use Yes  Type of laxative Miralax Enema or suppository use No  History of colostomy No  Incontinent No   BLADDER Pads In and out cath, frequency n/a Able to self cath No  Bladder incontinence No  Frequent urination No  Leakage with coughing No  Difficulty starting stream No  Incomplete bladder emptying No    Mobility use a walker how many minutes can you walk? 10 minutes ability to climb steps?  no do you drive?  no use a wheelchair  Function disabled: date disabled 2010 maybe I need assistance with the following:  dressing, bathing, toileting, meal prep, household duties, and shopping Do you have any goals in this area?  yes  Neuro/Psych weakness numbness trouble walking  Prior Studies Any changes since last visit?  no  Physicians involved in your care Any changes since last visit?  no   Family History  Problem Relation Age of Onset   Diabetes Mother    Heart disease  Mother    Kidney disease Brother        Two brothers on ESRD   Pancreatic cancer Sister    Amblyopia Neg Hx    Blindness Neg Hx    Cataracts Neg Hx    Glaucoma Neg Hx    Macular degeneration Neg Hx    Retinal detachment Neg Hx    Strabismus Neg Hx    Retinitis pigmentosa Neg Hx    Colon cancer Neg Hx    Esophageal cancer Neg Hx    Social History   Socioeconomic History   Marital status: Divorced    Spouse name: Not on file   Number of children: Not on file   Years of education: Not on file   Highest education level: Not on file  Occupational History   Not on file  Tobacco Use   Smoking status: Never   Smokeless tobacco: Never  Vaping Use   Vaping Use: Never used  Substance and Sexual Activity   Alcohol use: No   Drug use: No   Sexual activity: Not Currently  Other Topics Concern   Not on file  Social History Narrative   Not on file   Social Determinants of Health   Financial Resource Strain: Not on file  Food Insecurity: Not on file  Transportation  Needs: Not on file  Physical Activity: Not on file  Stress: Not on file  Social Connections: Not on file   Past Surgical History:  Procedure Laterality Date   APPENDECTOMY  1969   BREAST BIOPSY Right    CATARACT EXTRACTION     May 08657   LUMBAR DISC SURGERY     LUMBAR LAMINECTOMY/DECOMPRESSION MICRODISCECTOMY N/A 07/06/2015   Procedure: Thoracic ten- eleven laminectomy for spinal canal decompression;  Surgeon: Coletta Memos, MD;  Location: [facility];  Service: Orthopedics;  Laterality: N/A;   Past Medical History:  Diagnosis Date   Elevated LFTs    Fatty liver    on [place].    Hyperlipemia    Hypertension    Inguinal hernia    2020 CT   Stage 3b chronic kidney disease (HCC)    Thoracic spondylosis with myelopathy    Umbilical hernia    2020 CT   Uncontrolled type 2 diabetes with neuropathy (HCC)    Wheelchair bound    paraplegia of Leg - Left   There were no vitals taken for this visit.  Opioid  Risk Score:   Fall Risk Score:  `1  Depression screen PHQ 2/9  Depression screen [facility] 2/9 08/09/2020 02/02/2020 11/03/2019 08/31/2019 08/04/2019 08/25/2018 11/05/2017  Decreased Interest 0 0 0 0 0 0 0  Down, Depressed, Hopeless 0 0 0 0 0 0 3  PHQ - 2 Score 0 0 0 0 0 0 3    Review of Systems  Musculoskeletal:  Positive for back pain and gait problem.       Right wrist pain & swelling  Neurological:  Positive for weakness and numbness.  All other systems reviewed and are negative.     Objective:   Physical Exam  Awake,alert, appropriate, in "Kenn[place] Rounds" her new power w/c, NAD Point TTP over R ulnar styloid- with dorsum of wrist- significant swelling- moderate/severe. No heat/redness so doesn't look like gout. Has some passive ROM , but active ROM painful- has in R wrist brace.   L foot swelling 2+ ankle/foot swelling.       Assessment & Plan:     Pt is a 70 yr old R handed female  With hx of HTN, DM- poorly controlled per chart; , HLD, Vit B levels are high- with hx of thoracic spondylosis/myelopathy and LE weakness- mainly LLE weak.  Here for f/u on weakness.  Also new R wrist pain- ulnar styloid pain/TTP.   Will suggest to go to Urgent care for R wrist pain and swelling-    2. If cannot  get to urgent care, have ordered R wrist xray to make sure doesn't have fracture- which I think is likely- throbbing.   3. Will give Rx for tramadol- 1 pill every 6 hours AS NEEDED- gave 7 day supply since think has new fracture of R wrist- and if needs 1 refill, can do so, before starting on opiate contract/UDS, etc.   4. Getting new motors x2 put in NEW w/c- documenting, since if has problems with w/c in 2 years, will know it was already a problem.   5.  The more swelling you get- the more it's likely due to L leg being weaker, so cannot pump up fluid towards heart as well. The more she elevates or wears a compression sock- I would not suggest the compression stockings.  The compression socks don't  have as much compression/tightness as the stockings. Don't get stockings- cannot get them on!  6.  F/U  in 3 months

## 2020-10-16 NOTE — Patient Instructions (Addendum)
Pt is a 70 yr old R handed female  With hx of HTN, DM- poorly controlled per chart; , HLD, Vit B levels are high- with hx of thoracic spondylosis/myelopathy and LE weakness- mainly LLE weak.  Here for f/u on weakness.  Also new R wrist pain- ulnar styloid pain/TTP.   Will suggest to go to Urgent care for R wrist pain and swelling-    2. If cannot  get to urgent care, have ordered R wrist xray to make sure doesn't have fracture- which I think is likely- throbbing.  [facility]- [address]- can just "walk in". In the next month.   3. Will give Rx for tramadol- 1 pill every 6 hours AS NEEDED- gave 7 day supply since think has new fracture of R wrist- and if needs 1 refill, can do so, before starting on opiate contract/UDS, etc.   4. Getting new motors x2 put in NEW w/c- documenting, since if has problems with w/c in 2 years, will know it was already a problem.   5.  The more swelling you get- the more it's likely due to L leg being weaker, so cannot pump up fluid towards heart as well. The more she elevates or wears a compression sock- I would not suggest the compression stockings.  The compression socks don't have as much compression/tightness as the stockings. Don't get stockings- cannot get them on!  6.  F/U in 3 months

## 2020-10-17 ENCOUNTER — Encounter (HOSPITAL_COMMUNITY): Payer: Self-pay | Admitting: Emergency Medicine

## 2020-10-17 ENCOUNTER — Other Ambulatory Visit: Payer: Self-pay

## 2020-10-17 ENCOUNTER — Ambulatory Visit (HOSPITAL_COMMUNITY)
Admission: EM | Admit: 2020-10-17 | Discharge: 2020-10-17 | Disposition: A | Discharge status: Home / Self Care | Payer: Medicare Other | Attending: Internal Medicine | Admitting: Internal Medicine

## 2020-10-17 ENCOUNTER — Ambulatory Visit (INDEPENDENT_AMBULATORY_CARE_PROVIDER_SITE_OTHER): Payer: Medicare Other

## 2020-10-17 DIAGNOSIS — M25531 Pain in right wrist: Secondary | ICD-10-CM

## 2020-10-17 DIAGNOSIS — M7989 Other specified soft tissue disorders: Secondary | ICD-10-CM | POA: Diagnosis not present

## 2020-10-17 NOTE — Discharge Instructions (Addendum)
-  There is possibly a small fracture in the right wrist where you're having pain. It's healing on its own, but we want to make sure it continues to heal. -Call your primary care today and ask for a referral to an orthopedist. -Keep the splint on until you see the orthopedist. -Rest, ice, Tylenol

## 2020-10-17 NOTE — Progress Notes (Signed)
Orthopedic Tech Progress Note Patient Details:  Cassandra Stewart 1951-01-27 [id number]  Was given a VERBAL ORDER to apply an ULNA GUTTER SPLINT   Ortho Devices Type of Ortho Device: Ulna gutter splint Ortho Device/Splint Location: RUE Ortho Device/Splint Interventions: Ordered, Adjustment, Application   Post Interventions Patient Tolerated: Well Instructions Provided: Care of device  Donald Pore 10/17/2020, 2:23 PM

## 2020-10-17 NOTE — ED Provider Notes (Signed)
[facility]    CSN: [id number] Arrival date & time: 10/17/20  1152      History   Chief Complaint Chief Complaint  Patient presents with   Wrist Pain    HPI Cassandra Stewart is a 70 y.o. female presenting with right wrist injury x2 months. She is wheelchair bound at baseline. Fall 2 months ago, no other injuries.  Also with history of left leg paraplegia at baseline, thoracic spondylosis, hypertension, hyperlipidemia, aortic atherosclerosis.  Patient states that she was seeing her rehab doctor 1 day ago for the left leg issue, and she mentioned her right wrist pain.  They advised her to go to urgent care.  This is the first time she has sought medical attention for this injury.  Pain with movement.  Denies sensation changes.  She is right-handed.  HPI  Past Medical History:  Diagnosis Date   Elevated LFTs    Fatty liver    on [place].    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   Thoracic spondylosis with myelopathy 08/04/2019   Bilateral carpal tunnel syndrome 07/06/2019   Family history of heart disease 12/04/2018   At risk for heart disease 12/04/2018   Abdominal pain, chronic, left lower quadrant 08/25/2018   Personal history of noncompliance with medical treatment, presenting hazards to health 01/29/2017   Low serum vitamin B12 11/15/2016   CKD (chronic kidney disease) stage 3, GFR 30-59 ml/min ([place]) 10/24/2016   Fatty liver disease, nonalcoholic 53/66/4403    Hyperlipemia    Estrogen deficiency 10/25/2015   Thoracic spinal stenosis 07/06/2015   UTI (lower urinary tract infection) 07/04/2015   Uncontrolled type 2 diabetes with neuropathy ([place]) 07/04/2015   Essential hypertension 07/04/2015   Morbid obesity ([place]) 07/04/2015   Inability to walk 07/04/2015   Weakness of left leg 07/04/2015   Elevated LFTs 07/04/2015    Past Surgical History:  Procedure Laterality Date   APPENDECTOMY  1969   BREAST BIOPSY Right    CATARACT EXTRACTION     May 21016   LUMBAR DISC SURGERY     LUMBAR LAMINECTOMY/DECOMPRESSION MICRODISCECTOMY N/A 07/06/2015   Procedure: Thoracic ten- eleven laminectomy for spinal canal decompression;  Surgeon: Ashok Pall, MD;  Location: [facility];  Service: Orthopedics;  Laterality: N/A;    OB History   No obstetric history on file.      Home Medications    Prior to Admission medications   Medication Sig Start Date End Date Taking? Authorizing Provider  amLODipine (NORVASC) 10 MG tablet TAKE 1 TABLET(10 MG) BY MOUTH EVERY MORNING 09/12/20  Yes Henson, Vickie L, NP-C  aspirin EC 81 MG tablet Take 81 mg by mouth every evening.   Yes [provider]  Cyanocobalamin (VITAMIN B 12 PO) Take 5,000 mg by mouth daily.   Yes [provider]  Dulaglutide (TRULICITY) 3 KV/4.2VZ SOPN Inject 3 mg into the skin once a  week. 08/04/20  Yes Philemon Kingdom, MD  gabapentin (NEURONTIN) 300 MG capsule TAKE 1 CAPSULE(300 MG) BY MOUTH THREE TIMES DAILY 09/20/19  Yes Lovorn, Jinny Blossom, MD  hydrochlorothiazide (MICROZIDE) 12.5 MG capsule TAKE 1 CAPSULE(12.5 MG) BY MOUTH DAILY 09/12/20  Yes Henson, Vickie L, NP-C  insulin lispro (HUMALOG) 100 UNIT/ML KwikPen INJECT 7-10 UNITS UNDER THE SKIN 2-3 TIMES DAILY 12/02/19  Yes Philemon Kingdom, MD  lovastatin (MEVACOR) 40 MG tablet TAKE 1 TABLET(40 MG) BY MOUTH AT BEDTIME 09/12/20  Yes Henson, Vickie L, NP-C  metFORMIN (GLUCOPHAGE-XR) 500 MG 24 hr tablet Take 1 tablet (500 mg total) by mouth  2 (two) times daily. 12/02/19  Yes Philemon Kingdom, MD  TOUJEO SOLOSTAR 300 UNIT/ML Solostar Pen Inject 40 Units into the skin at bedtime. 08/04/20  Yes Philemon Kingdom, MD  Accu-Chek FastClix Lancets MISC USE FOUR TIMES DAILY AS DIRECTED 03/16/20   Philemon Kingdom, MD  ACCU-CHEK GUIDE test strip USE TO TEST BLOOD SUGAR FOUR TIMES DAILY AS DIRECTED 06/14/20   Philemon Kingdom, MD  acetaminophen (TYLENOL) 650 MG CR tablet Take 650 mg by mouth every 8 (eight) hours as needed for pain.    [provider]  albuterol (VENTOLIN HFA) 108 (90 Base) MCG/ACT inhaler Inhale 1-2 puffs into the lungs every 4 (four) hours as needed for wheezing or shortness of breath. 05/13/14   [provider]  baclofen (LIORESAL) 10 MG tablet Take 0.5-1 tablets (5-10 mg total) by mouth at bedtime. For muscle spasms- at night. Patient not taking: Reported on 10/17/2020 08/04/19   Courtney Heys, MD  Blood Glucose Monitoring Suppl (ACCU-CHEK AVIVA PLUS) w/Device KIT Use as advised 03/30/19   Philemon Kingdom, MD  Cholecalciferol (VITAMIN D PO) Take 1,000 mg by mouth daily.     [provider]  Continuous Blood Gluc Receiver ([place]) [place] by Does not apply route.    [provider]  dicyclomine (BENTYL) 10 MG capsule Take 1 capsule (10 mg total) by mouth every 8 (eight) hours as needed for spasms. 06/23/20   Armbruster, Carlota Raspberry, MD  Insulin Pen Needle (CAREFINE PEN NEEDLES) 32G X 4 MM MISC Use 4x a day 09/14/19   Philemon Kingdom, MD  lidocaine (XYLOCAINE) 5 % ointment Apply 1 application topically 4 (four) times daily as needed. For fingers tips pain 06/07/20   Lovorn, Jinny Blossom, MD  nitroGLYCERIN (NITROSTAT) 0.4 MG SL tablet Place 1 tablet (0.4 mg total) under the tongue every 5 (five) minutes as needed for chest pain. 01/13/19   Hilty, Nadean Corwin, MD  polyethylene glycol (MIRALAX / GLYCOLAX) 17 g packet Take 17 g by mouth as needed.    [provider]  prednisoLONE acetate (PRED  FORTE) 1 % ophthalmic suspension Place 1 drop into the left eye 4 (four) times daily. Patient not taking: Reported on 10/17/2020 09/08/19   Bernarda Caffey, MD  pregabalin (LYRICA) 50 MG capsule Take 1 capsule (50 mg total) by mouth 2 (two) times daily. Patient not taking: Reported on 10/17/2020 02/02/20   Courtney Heys, MD  traMADol (ULTRAM) 50 MG tablet Take 1 tablet (50 mg total) by mouth every 6 (six) hours as needed. 10/16/20   Courtney Heys, MD    Family History Family History  Problem Relation Age of Onset   Diabetes Mother    Heart disease Mother    Pancreatic cancer Sister    Kidney disease Brother        Two brothers on ESRD   Amblyopia Neg Hx  Blindness Neg Hx    Cataracts Neg Hx    Glaucoma Neg Hx    Macular degeneration Neg Hx    Retinal detachment Neg Hx    Strabismus Neg Hx    Retinitis pigmentosa Neg Hx    Colon cancer Neg Hx    Esophageal cancer Neg Hx     Social History Social History   Tobacco Use   Smoking status: Never   Smokeless tobacco: Never  Vaping Use   Vaping Use: Never used  Substance Use Topics   Alcohol use: No   Drug use: No     Allergies   Other, Chlorhexidine, and Oxycodone hcl   Review of Systems Review of Systems  Musculoskeletal:        R wrist pain  All other systems reviewed and are negative.   Physical Exam Triage Vital Signs ED Triage Vitals  Enc Vitals Group     BP 10/17/20 1317 140/77     Pulse Rate 10/17/20 1317 72     Resp 10/17/20 1317 20     Temp 10/17/20 1317 98.1 F (36.7 C)     Temp Source 10/17/20 1317 Oral     SpO2 10/17/20 1317 95 %     Weight --      Height --      Head Circumference --      Peak Flow --      Pain Score 10/17/20 1313 7     Pain Loc --      Pain Edu? --      Excl. in [place]? --    No data found.  Updated Vital Signs BP 140/77 (BP Location: Left Arm)   Pulse 72   Temp 98.1 F (36.7 C) (Oral)   Resp 20   SpO2 95%   Visual Acuity Right Eye Distance:   Left Eye Distance:    Bilateral Distance:    Right Eye Near:   Left Eye Near:    Bilateral Near:     Physical Exam Vitals reviewed.  Constitutional:      General: She is not in acute distress.    Appearance: Normal appearance. She is not ill-appearing or diaphoretic.  HENT:     Head: Normocephalic and atraumatic.  Cardiovascular:     Rate and Rhythm: Normal rate and regular rhythm.     Heart sounds: Normal heart sounds.  Pulmonary:     Effort: Pulmonary effort is normal.     Breath sounds: Normal breath sounds.  Musculoskeletal:     Comments: R wrist- TTP ulnar styloid. ROM wrist intact but pain with flexion and extension. No skin changes. Sensation intact. Radial pulse 2+, cap refill <2 seconds. No snuffbox tenderness.   Skin:    General: Skin is warm.  Neurological:     General: No focal deficit present.     Mental Status: She is alert and oriented to person, place, and time.  Psychiatric:        Mood and Affect: Mood normal.        Behavior: Behavior normal.        Thought Content: Thought content normal.        Judgment: Judgment normal.     UC Treatments / Results  Labs (all labs ordered are listed, but only abnormal results are displayed) Labs Reviewed - No data to display  EKG   Radiology DG Wrist Complete Right  Result Date: 10/17/2020 CLINICAL DATA:  Fall with pain and swelling. EXAM: RIGHT WRIST -  COMPLETE 3+ VIEW COMPARISON:  None. FINDINGS: There is no evidence of fracture or dislocation. There is no evidence of arthropathy or other focal bone abnormality. Soft tissues are unremarkable. IMPRESSION: Negative. Electronically Signed   By: Misty Stanley M.D.   On: 10/17/2020 14:02    Procedures Procedures (including critical care time)  Medications Ordered in UC Medications - No data to display  Initial Impression / Assessment and Plan / UC Course  I have reviewed the triage vital signs and the nursing notes.  Pertinent labs & imaging results that were available during my  care of the patient were reviewed by me and considered in my medical decision making (see chart for details).     This patient is a very pleasant 70 y.o. year old female presenting with R wrist pain x2 months following fall. Neurovascularly intact.   Xray R wrist- negative.   Based on point tenderness and xrays, I am concerned for ulnar styloid fracture. Reviewed films with attending physician Dr. Hinda Glatter who is in agreement. Will place patient in ulnar gutter splint and ortho f/u. Patient is in agreement.   This patient is wheelchair bound at baseline.   Final Clinical Impressions(s) / UC Diagnoses   Final diagnoses:  Right wrist pain     Discharge Instructions      -There is possibly a small fracture in the right wrist where you're having pain. It's healing on its own, but we want to make sure it continues to heal. -Call your primary care today and ask for a referral to an orthopedist. -Keep the splint on until you see the orthopedist. -Rest, ice, Tylenol     ED Prescriptions   None    PDMP not reviewed this encounter.   Hazel Sams, PA-C 10/17/20 1413

## 2020-10-17 NOTE — ED Triage Notes (Addendum)
Patient fell 2 months ago and injured right wrist.  Patient did see a rehab doctor yesterday( for other issue) and mentioned this wrist to her.  Was told to come to urgent care.  Pain on ulnar side of wrist.  Can move all fingers, but little finger causes the most discomfort

## 2020-10-17 NOTE — ED Notes (Signed)
Ortho tech applied splint 

## 2020-10-18 ENCOUNTER — Telehealth: Payer: Self-pay | Admitting: *Deleted

## 2020-10-18 DIAGNOSIS — M25531 Pain in right wrist: Secondary | ICD-10-CM

## 2020-10-18 NOTE — Telephone Encounter (Signed)
Mrs Marrufo went to Urgent Care as instructed by Dr Berline Chough and she is needing a referral to ortho. She reports they told her to get Dr Berline Chough to do the referral.

## 2020-10-24 ENCOUNTER — Ambulatory Visit (INDEPENDENT_AMBULATORY_CARE_PROVIDER_SITE_OTHER): Payer: Medicare Other | Admitting: Orthopedic Surgery

## 2020-10-24 ENCOUNTER — Encounter: Payer: Self-pay | Admitting: Orthopedic Surgery

## 2020-10-24 ENCOUNTER — Ambulatory Visit: Payer: Self-pay

## 2020-10-24 DIAGNOSIS — M25531 Pain in right wrist: Secondary | ICD-10-CM

## 2020-10-24 DIAGNOSIS — S6981XA Other specified injuries of right wrist, hand and finger(s), initial encounter: Secondary | ICD-10-CM

## 2020-10-24 NOTE — Progress Notes (Signed)
Office Visit Note   Patient: Cassandra Stewart           Date of Birth: 26-Mar-1950           MRN: [id number] Visit Date: 10/24/2020              Requested by: Genice Rouge, MD [phone] [address]. [address] [place],  [place] [address] PCP: Avanell Shackleton, NP-C  Chief Complaint  Patient presents with   Right Wrist - Pain      HPI: Patient is a 70 year old woman who states that she fell about a week ago had to push herself up from the floor she had ulnar-sided right wrist pain was referred to urgent care was placed in a splint and was referred here for evaluation of her wrist injury.  Assessment & Plan: Visit Diagnoses:  1. Pain in right wrist     Plan: Patient has a TFCC sprain recommended using the short arm Velcro splint and she can use topical Voltaren gel.  Patient is in a motorized wheelchair so will need the wrist splint support long time to unload the TFCC.  Follow-Up Instructions: No follow-ups on file.   Ortho Exam  Patient is alert, oriented, no adenopathy, well-dressed, normal affect, normal respiratory effort. Examination patient's hand is neurovascular intact she has good pulses there is no tenderness to palpation over the radius or ulna.  The scaphoid scapholunate are nontender to palpation she is point tender to palpation over the TFCC.  Imaging: No results found. No images are attached to the encounter.  Labs: Lab Results  Component Value Date   HGBA1C 8.5 (A) 08/04/2020   HGBA1C 9.9 (A) 04/06/2020   HGBA1C 7.4 (A) 12/02/2019   REPTSTATUS 07/06/2015 FINAL 07/03/2015   CULT >=100,000 COLONIES/mL ESCHERICHIA COLI (A) 07/03/2015   LABORGA ESCHERICHIA COLI (A) 07/03/2015     Lab Results  Component Value Date   ALBUMIN 4.1 03/02/2020   ALBUMIN 4.2 08/31/2019   ALBUMIN 4.1 09/18/2018    No results found for: MG Lab Results  Component Value Date   VD25OH 33 03/04/2016   VD25OH 28 (L) 10/25/2015    No results found for: PREALBUMIN CBC EXTENDED  Latest Ref Rng & Units 03/02/2020 08/31/2019 11/30/2018  WBC 3.4 - 10.8 x10E3/uL 7.0 6.2 7.2  RBC 3.77 - 5.28 x10E6/uL 4.47 4.22 4.39  HGB 11.1 - 15.9 g/dL 37.0 48.8 89.1  HCT 69.4 - 46.6 % 38.4 37.2 40.8  PLT 150 - 450 x10E3/uL 297 291 284  NEUTROABS 1.4 - 7.0 x10E3/uL 4.2 2.8 -  LYMPHSABS 0.7 - 3.1 x10E3/uL 2.1 2.7 -     There is no height or weight on file to calculate BMI.  Orders:  Orders Placed This Encounter  Procedures   XR Wrist Complete Right   No orders of the defined types were placed in this encounter.    Procedures: No procedures performed  Clinical Data: No additional findings.  ROS:  All other systems negative, except as noted in the HPI. Review of Systems  Objective: Vital Signs: There were no vitals taken for this visit.  Specialty Comments:  No specialty comments available.  PMFS History: Patient Active Problem List   Diagnosis Date Noted   Swelling of limb 10/16/2020   Nerve pain 06/07/2020   Hyperlipidemia associated with type 2 diabetes mellitus (HCC) 03/02/2020   Atherosclerosis of aorta (HCC) 03/02/2020   Wheelchair dependence 08/04/2019   Thoracic spondylosis with myelopathy 08/04/2019   Bilateral carpal tunnel syndrome 07/06/2019  Family history of heart disease 12/04/2018   At risk for heart disease 12/04/2018   Abdominal pain, chronic, left lower quadrant 08/25/2018   Personal history of noncompliance with medical treatment, presenting hazards to health 01/29/2017   Low serum vitamin B12 11/15/2016   CKD (chronic kidney disease) stage 3, GFR 30-59 ml/min (HCC) 10/24/2016   Fatty liver disease, nonalcoholic 10/24/2016   Hyperlipemia    Estrogen deficiency 10/25/2015   Thoracic spinal stenosis 07/06/2015   UTI (lower urinary tract infection) 07/04/2015   Uncontrolled type 2 diabetes with neuropathy (HCC) 07/04/2015   Essential hypertension 07/04/2015   Morbid obesity (HCC) 07/04/2015   Inability to walk 07/04/2015   Weakness of left  leg 07/04/2015   Elevated LFTs 07/04/2015   Past Medical History:  Diagnosis Date   Elevated LFTs    Fatty liver    on [place].    Hyperlipemia    Hypertension    Inguinal hernia    2020 CT   Stage 3b chronic kidney disease (HCC)    Thoracic spondylosis with myelopathy    Umbilical hernia    2020 CT   Uncontrolled type 2 diabetes with neuropathy (HCC)    Wheelchair bound    paraplegia of Leg - Left    Family History  Problem Relation Age of Onset   Diabetes Mother    Heart disease Mother    Pancreatic cancer Sister    Kidney disease Brother        Two brothers on ESRD   Amblyopia Neg Hx    Blindness Neg Hx    Cataracts Neg Hx    Glaucoma Neg Hx    Macular degeneration Neg Hx    Retinal detachment Neg Hx    Strabismus Neg Hx    Retinitis pigmentosa Neg Hx    Colon cancer Neg Hx    Esophageal cancer Neg Hx     Past Surgical History:  Procedure Laterality Date   APPENDECTOMY  1969   BREAST BIOPSY Right    CATARACT EXTRACTION     May 00867   LUMBAR DISC SURGERY     LUMBAR LAMINECTOMY/DECOMPRESSION MICRODISCECTOMY N/A 07/06/2015   Procedure: Thoracic ten- eleven laminectomy for spinal canal decompression;  Surgeon: Coletta Memos, MD;  Location: [facility];  Service: Orthopedics;  Laterality: N/A;   Social History   Occupational History   Not on file  Tobacco Use   Smoking status: Never   Smokeless tobacco: Never  Vaping Use   Vaping Use: Never used  Substance and Sexual Activity   Alcohol use: No   Drug use: No   Sexual activity: Not Currently

## 2020-11-14 ENCOUNTER — Telehealth: Payer: Self-pay | Admitting: Internal Medicine

## 2020-11-14 NOTE — Telephone Encounter (Signed)
Patient called to advise that [facility] called and advised her that they have been trying to get RX and documentation from [place] for her Dexcom G6 - transmitter and sensors.    She is almost out and they are not going to be able to shop anything to her.  Patient's number for contact is [phone] and [facility] contact number is  [phone].

## 2020-11-16 NOTE — Telephone Encounter (Signed)
Pt calling in voiced that [facility] will be canceling the order within a week due to not hearing anything from this office.  Pt would like a call back from the nurse regarding these messages. [phone]

## 2020-11-20 NOTE — Telephone Encounter (Signed)
Patient called again re: Patient has not heard back from office and states that Patient is out of Dexcom G6 supplies for almost 1 week (transmitter quit working). Patient states that [facility] held off on cancelling Order for Dexcom G6/supplies since last week because they were waiting for the clinical notes from Dr. Elvera Lennox, however, [facility] never received the required clinical notes, therefore, [facility] is cancelling the order for Patient's Dexcom G6/supplies.  Patient requests to be called at ph# [phone] to discuss what Patient should do. Patient states she is very upset and does not want to go pricking her fingers.

## 2020-11-28 ENCOUNTER — Encounter (HOSPITAL_BASED_OUTPATIENT_CLINIC_OR_DEPARTMENT_OTHER): Payer: Self-pay | Admitting: *Deleted

## 2020-11-28 ENCOUNTER — Emergency Department (HOSPITAL_BASED_OUTPATIENT_CLINIC_OR_DEPARTMENT_OTHER)
Admission: EM | Admit: 2020-11-28 | Discharge: 2020-11-28 | Disposition: A | Discharge status: Home / Self Care | Payer: Medicare Other | Attending: Emergency Medicine | Admitting: Emergency Medicine

## 2020-11-28 ENCOUNTER — Telehealth: Payer: Self-pay | Admitting: Internal Medicine

## 2020-11-28 ENCOUNTER — Emergency Department (HOSPITAL_BASED_OUTPATIENT_CLINIC_OR_DEPARTMENT_OTHER): Payer: Medicare Other | Admitting: Radiology

## 2020-11-28 ENCOUNTER — Other Ambulatory Visit: Payer: Self-pay

## 2020-11-28 DIAGNOSIS — Z79899 Other long term (current) drug therapy: Secondary | ICD-10-CM | POA: Diagnosis not present

## 2020-11-28 DIAGNOSIS — Z743 Need for continuous supervision: Secondary | ICD-10-CM | POA: Diagnosis not present

## 2020-11-28 DIAGNOSIS — Z794 Long term (current) use of insulin: Secondary | ICD-10-CM | POA: Insufficient documentation

## 2020-11-28 DIAGNOSIS — R0789 Other chest pain: Secondary | ICD-10-CM | POA: Diagnosis not present

## 2020-11-28 DIAGNOSIS — R079 Chest pain, unspecified: Secondary | ICD-10-CM | POA: Diagnosis present

## 2020-11-28 DIAGNOSIS — E1122 Type 2 diabetes mellitus with diabetic chronic kidney disease: Secondary | ICD-10-CM | POA: Diagnosis not present

## 2020-11-28 DIAGNOSIS — Z7982 Long term (current) use of aspirin: Secondary | ICD-10-CM | POA: Insufficient documentation

## 2020-11-28 DIAGNOSIS — Z7984 Long term (current) use of oral hypoglycemic drugs: Secondary | ICD-10-CM | POA: Diagnosis not present

## 2020-11-28 DIAGNOSIS — M542 Cervicalgia: Secondary | ICD-10-CM | POA: Diagnosis not present

## 2020-11-28 DIAGNOSIS — N1832 Chronic kidney disease, stage 3b: Secondary | ICD-10-CM | POA: Insufficient documentation

## 2020-11-28 DIAGNOSIS — I129 Hypertensive chronic kidney disease with stage 1 through stage 4 chronic kidney disease, or unspecified chronic kidney disease: Secondary | ICD-10-CM | POA: Diagnosis not present

## 2020-11-28 DIAGNOSIS — R6889 Other general symptoms and signs: Secondary | ICD-10-CM | POA: Diagnosis not present

## 2020-11-28 DIAGNOSIS — M436 Torticollis: Secondary | ICD-10-CM | POA: Diagnosis not present

## 2020-11-28 LAB — BASIC METABOLIC PANEL
Anion gap: 10 (ref 5–15)
BUN: 20 mg/dL (ref 8–23)
CO2: 27 mmol/L (ref 22–32)
Calcium: 9.4 mg/dL (ref 8.9–10.3)
Chloride: 103 mmol/L (ref 98–111)
Creatinine, Ser: 1.1 mg/dL — ABNORMAL HIGH (ref 0.44–1.00)
GFR, Estimated: 53 mL/min — ABNORMAL LOW (ref >60)
Glucose, Bld: 115 mg/dL — ABNORMAL HIGH (ref 70–99)
Potassium: 4.3 mmol/L (ref 3.5–5.1)
Sodium: 140 mmol/L (ref 135–145)

## 2020-11-28 LAB — CBC
HCT: 39.7 % (ref 36.0–46.0)
Hemoglobin: 12.8 g/dL (ref 12.0–15.0)
MCH: 29 pg (ref 26.0–34.0)
MCHC: 32.2 g/dL (ref 30.0–36.0)
MCV: 90 fL (ref 80.0–100.0)
Platelets: 308 10*3/uL (ref 150–400)
RBC: 4.41 MIL/uL (ref 3.87–5.11)
RDW: 13.4 % (ref 11.5–15.5)
WBC: 7.3 10*3/uL (ref 4.0–10.5)
nRBC: 0 % (ref 0.0–0.2)

## 2020-11-28 LAB — TROPONIN I (HIGH SENSITIVITY)
Troponin I (High Sensitivity): 5 ng/L (ref <18)
Troponin I (High Sensitivity): 5 ng/L (ref <18)

## 2020-11-28 LAB — D-DIMER, QUANTITATIVE: D-Dimer, Quant: 0.52 ug/mL-FEU — ABNORMAL HIGH (ref 0.00–0.50)

## 2020-11-28 MED ORDER — DICLOFENAC SODIUM 1 % EX GEL
4.0000 g | Freq: Four times a day (QID) | CUTANEOUS | 0 refills | Status: AC
Start: 2020-11-28 — End: ?

## 2020-11-28 NOTE — Discharge Instructions (Signed)
  Also take tylenol 1000mg (2 extra strength) four times a day. Use the gel as prescribed.  Follow up with your family doc.

## 2020-11-28 NOTE — Telephone Encounter (Signed)
Veronica with [facility]  requests that [facility] be called at ph# [phone] re: status of request from [employer] for Patient's date last seen/office visit notes that were faxed to Fax# [phone] on 11/07/20 and 11/22/20 with no response  Reference# [id number]  [facility] Fax# [phone]

## 2020-11-28 NOTE — ED Triage Notes (Signed)
Woke up this morning with rt neck, shoulder and pain between shoulder blades, She was instructed to take 4 Baby ASA which has helped the pain. Pt able to move ext and neck.

## 2020-11-28 NOTE — ED Provider Notes (Signed)
[facility] EMERGENCY DEPT Provider Note   CSN: [id number] Arrival date & time: 11/28/20  1528     History Chief Complaint  Patient presents with   Torticollis    Cassandra Stewart is a 70 y.o. female.  70 yo F with a chief complaints of chest pain that radiates to the neck and then down the right arm.  This has been going on since this morning.  She had just had breakfast and swallowed her pills when it occurred.  She felt very severe pain and she felt very weak with that.  She was too weak to stand up after she went to the bathroom but eventually was able to make her way to the window where she waved over a neighbor who was able to call 911 for her.  She got aspirin with EMS and has gotten a bit better.  Yesterday she went shopping and had no issue.  Denied any symptoms at all yesterday.  Denies cough congestion or fever.  Denies decreased oral intake.  She has a history of hypertension hyperlipidemia diabetes denies smoking Mom and grandma had heart attacks in their 56s.  Denies history of PE or DVT denies hemoptysis denies unilateral lower extremity edema denies recent surgery immobilization or hospitalization denies history of cancer.  The history is provided by the patient.  Illness Severity:  Moderate Onset quality:  Gradual Duration:  2 days Timing:  Constant Progression:  Worsening Chronicity:  New Associated symptoms: no chest pain, no congestion, no fever, no headaches, no myalgias, no nausea, no rhinorrhea, no shortness of breath, no vomiting and no wheezing       Past Medical History:  Diagnosis Date   Elevated LFTs    Fatty liver    on [place].    Hyperlipemia    Hypertension    Inguinal hernia    2020 CT   Stage 3b chronic kidney disease ([place])    Thoracic spondylosis with myelopathy    Umbilical hernia    5364 CT   Uncontrolled type 2 diabetes with neuropathy    Wheelchair bound    paraplegia of Leg - Left    Patient Active Problem List    Diagnosis Date Noted   Swelling of limb 10/16/2020   Nerve pain 06/07/2020   Hyperlipidemia associated with type 2 diabetes mellitus ([place]) 03/02/2020   Atherosclerosis of aorta ([place]) 03/02/2020   Wheelchair dependence 08/04/2019   Thoracic spondylosis with myelopathy 08/04/2019   Bilateral carpal tunnel syndrome 07/06/2019   Family history of heart disease 12/04/2018   At risk for heart disease 12/04/2018   Abdominal pain, chronic, left lower quadrant 08/25/2018   Personal history of noncompliance with medical treatment, presenting hazards to health 01/29/2017   Low serum vitamin B12 11/15/2016   CKD (chronic kidney disease) stage 3, GFR 30-59 ml/min ([place]) 10/24/2016   Fatty liver disease, nonalcoholic 68/04/2120   Hyperlipemia    Estrogen deficiency 10/25/2015   Thoracic spinal stenosis 07/06/2015   UTI (lower urinary tract infection) 07/04/2015   Uncontrolled type 2 diabetes with neuropathy 07/04/2015   Essential hypertension 07/04/2015   Morbid obesity ([place]) 07/04/2015   Inability to walk 07/04/2015   Weakness of left leg 07/04/2015   Elevated LFTs 07/04/2015    Past Surgical History:  Procedure Laterality Date   APPENDECTOMY  1969   BREAST BIOPSY Right    CATARACT EXTRACTION     May 21016   LUMBAR DISC SURGERY     LUMBAR LAMINECTOMY/DECOMPRESSION MICRODISCECTOMY N/A 07/06/2015  Procedure: Thoracic ten- eleven laminectomy for spinal canal decompression;  Surgeon: Ashok Pall, MD;  Location: [facility];  Service: Orthopedics;  Laterality: N/A;     OB History   No obstetric history on file.     Family History  Problem Relation Age of Onset   Diabetes Mother    Heart disease Mother    Pancreatic cancer Sister    Kidney disease Brother        Two brothers on ESRD   Amblyopia Neg Hx    Blindness Neg Hx    Cataracts Neg Hx    Glaucoma Neg Hx    Macular degeneration Neg Hx    Retinal detachment Neg Hx    Strabismus Neg Hx    Retinitis pigmentosa Neg Hx     Colon cancer Neg Hx    Esophageal cancer Neg Hx     Social History   Tobacco Use   Smoking status: Never   Smokeless tobacco: Never  Vaping Use   Vaping Use: Never used  Substance Use Topics   Alcohol use: No   Drug use: No    Home Medications Prior to Admission medications   Medication Sig Start Date End Date Taking? Authorizing Provider  diclofenac Sodium (VOLTAREN) 1 % GEL Apply 4 g topically 4 (four) times daily. 11/28/20  Yes Deno Etienne, DO  Accu-Chek FastClix Lancets MISC USE FOUR TIMES DAILY AS DIRECTED 03/16/20   Philemon Kingdom, MD  ACCU-CHEK GUIDE test strip USE TO TEST BLOOD SUGAR FOUR TIMES DAILY AS DIRECTED 06/14/20   Philemon Kingdom, MD  acetaminophen (TYLENOL) 650 MG CR tablet Take 650 mg by mouth every 8 (eight) hours as needed for pain.    [provider]  albuterol (VENTOLIN HFA) 108 (90 Base) MCG/ACT inhaler Inhale 1-2 puffs into the lungs every 4 (four) hours as needed for wheezing or shortness of breath. 05/13/14   [provider]  amLODipine (NORVASC) 10 MG tablet TAKE 1 TABLET(10 MG) BY MOUTH EVERY MORNING 09/12/20   Henson, Vickie L, NP-C  aspirin EC 81 MG tablet Take 81 mg by mouth every evening.    [provider]  baclofen (LIORESAL) 10 MG tablet Take 0.5-1 tablets (5-10 mg total) by mouth at bedtime. For muscle spasms- at night. Patient not taking: Reported on 10/17/2020 08/04/19   Courtney Heys, MD  Blood Glucose Monitoring Suppl (ACCU-CHEK AVIVA PLUS) w/Device KIT Use as advised 03/30/19   Philemon Kingdom, MD  Cholecalciferol (VITAMIN D PO) Take 1,000 mg by mouth daily.     [provider]  Continuous Blood Gluc Receiver ([place]) [place] by Does not apply route.    [provider]  Cyanocobalamin (VITAMIN B 12 PO) Take 5,000 mg by mouth daily.    [provider]  dicyclomine (BENTYL) 10 MG capsule Take 1 capsule (10 mg total) by mouth every 8 (eight) hours as needed for spasms. 06/23/20    Armbruster, Carlota Raspberry, MD  Dulaglutide (TRULICITY) 3 BZ/2.0EY SOPN Inject 3 mg into the skin once a week. 08/04/20   Philemon Kingdom, MD  gabapentin (NEURONTIN) 300 MG capsule TAKE 1 CAPSULE(300 MG) BY MOUTH THREE TIMES DAILY 09/20/19   Lovorn, Jinny Blossom, MD  hydrochlorothiazide (MICROZIDE) 12.5 MG capsule TAKE 1 CAPSULE(12.5 MG) BY MOUTH DAILY 09/12/20   Henson, Vickie L, NP-C  insulin lispro (HUMALOG) 100 UNIT/ML KwikPen INJECT 7-10 UNITS UNDER THE SKIN 2-3 TIMES DAILY 12/02/19   Philemon Kingdom, MD  Insulin Pen Needle (CAREFINE PEN NEEDLES) 32G X  4 MM MISC Use 4x a day 09/14/19   Philemon Kingdom, MD  lidocaine (XYLOCAINE) 5 % ointment Apply 1 application topically 4 (four) times daily as needed. For fingers tips pain 06/07/20   Lovorn, Jinny Blossom, MD  lovastatin (MEVACOR) 40 MG tablet TAKE 1 TABLET(40 MG) BY MOUTH AT BEDTIME 09/12/20   Henson, Vickie L, NP-C  metFORMIN (GLUCOPHAGE-XR) 500 MG 24 hr tablet Take 1 tablet (500 mg total) by mouth 2 (two) times daily. 12/02/19   Philemon Kingdom, MD  nitroGLYCERIN (NITROSTAT) 0.4 MG SL tablet Place 1 tablet (0.4 mg total) under the tongue every 5 (five) minutes as needed for chest pain. 01/13/19   Hilty, Nadean Corwin, MD  polyethylene glycol (MIRALAX / GLYCOLAX) 17 g packet Take 17 g by mouth as needed.    [provider]  prednisoLONE acetate (PRED FORTE) 1 % ophthalmic suspension Place 1 drop into the left eye 4 (four) times daily. Patient not taking: Reported on 10/17/2020 09/08/19   Bernarda Caffey, MD  pregabalin (LYRICA) 50 MG capsule Take 1 capsule (50 mg total) by mouth 2 (two) times daily. Patient not taking: Reported on 10/17/2020 02/02/20   Lovorn, Jinny Blossom, MD  TOUJEO SOLOSTAR 300 UNIT/ML Solostar Pen Inject 40 Units into the skin at bedtime. 08/04/20   Philemon Kingdom, MD  traMADol (ULTRAM) 50 MG tablet Take 1 tablet (50 mg total) by mouth every 6 (six) hours as needed. 10/16/20   Lovorn, Jinny Blossom, MD    Allergies    Other, Chlorhexidine, and Oxycodone  hcl  Review of Systems   Review of Systems  Constitutional:  Negative for chills and fever.  HENT:  Negative for congestion and rhinorrhea.   Eyes:  Negative for redness and visual disturbance.  Respiratory:  Negative for shortness of breath and wheezing.   Cardiovascular:  Negative for chest pain and palpitations.  Gastrointestinal:  Negative for nausea and vomiting.  Genitourinary:  Negative for dysuria and urgency.  Musculoskeletal:  Negative for arthralgias and myalgias.  Skin:  Negative for pallor and wound.  Neurological:  Negative for dizziness and headaches.   Physical Exam Updated Vital Signs BP 103/76   Pulse 63   Temp 97.7 F (36.5 C)   Resp 18   Ht 5' 4.5" (1.638 m)   Wt 90.7 kg   SpO2 100%   BMI 33.80 kg/m   Physical Exam Vitals and nursing note reviewed.  Constitutional:      General: She is not in acute distress.    Appearance: She is well-developed. She is not diaphoretic.     Comments: BMI 33  HENT:     Head: Normocephalic and atraumatic.  Eyes:     Pupils: Pupils are equal, round, and reactive to light.  Cardiovascular:     Rate and Rhythm: Normal rate and regular rhythm.     Heart sounds: No murmur heard.   No friction rub. No gallop.  Pulmonary:     Effort: Pulmonary effort is normal.     Breath sounds: No wheezing or rales.  Abdominal:     General: There is no distension.     Palpations: Abdomen is soft.     Tenderness: There is no abdominal tenderness.  Musculoskeletal:        General: Tenderness present.     Cervical back: Normal range of motion and neck supple.     Comments: Tenderness to palpation of the right chest wall just below the clavicle reproduces her symptoms.  Skin:    General: Skin  is warm and dry.  Neurological:     Mental Status: She is alert and oriented to person, place, and time.  Psychiatric:        Behavior: Behavior normal.    ED Results / Procedures / Treatments   Labs (all labs ordered are listed, but only  abnormal results are displayed) Labs Reviewed  BASIC METABOLIC PANEL - Abnormal; Notable for the following components:      Result Value   Glucose, Bld 115 (*)    Creatinine, Ser 1.10 (*)    GFR, Estimated 53 (*)    All other components within normal limits  D-DIMER, QUANTITATIVE - Abnormal; Notable for the following components:   D-Dimer, Quant 0.52 (*)    All other components within normal limits  CBC  TROPONIN I (HIGH SENSITIVITY)  TROPONIN I (HIGH SENSITIVITY)    EKG EKG Interpretation  Date/Time:  Tuesday November 28 2020 15:54:54 EDT Ventricular Rate:  69 PR Interval:  138 QRS Duration: 76 QT Interval:  374 QTC Calculation: 400 R Axis:   -6 Text Interpretation: Normal sinus rhythm Cannot rule out Anterior infarct , age undetermined Abnormal ECG No significant change since last tracing Confirmed by Deno Etienne [id number]) on 11/28/2020 4:17:51 PM  Radiology DG Chest 2 View  Result Date: 11/28/2020 CLINICAL DATA:  Neck pain, shoulder pain EXAM: CHEST - 2 VIEW COMPARISON:  Chest radiograph 11/30/2018 FINDINGS: The cardiomediastinal silhouette is normal. The lungs are clear, without focal consolidation or pulmonary edema. There is no pleural effusion or pneumothorax. There is no acute osseous abnormality. IMPRESSION: No radiographic evidence of acute cardiopulmonary process. Electronically Signed   By: Valetta Mole M.D.   On: 11/28/2020 16:27    Procedures Procedures   Medications Ordered in ED Medications - No data to display  ED Course  I have reviewed the triage vital signs and the nursing notes.  Pertinent labs & imaging results that were available during my care of the patient were reviewed by me and considered in my medical decision making (see chart for details).    MDM Rules/Calculators/A&P                           70 yO F with a chief complaint of chest pain that radiates to the neck and then down the right arm.  This been going on since this morning.  Possibly  muscular a bit worse with palpation on exam.  She was describing sudden severe pain this morning that made her weak and radiates to her back.  We will obtain a D-dimer.  Chest x-ray blood work reassess.  D-dimer age-adjusted negative.  Chest x-ray viewed by me without focal infiltrate.  Delta negative D/c home.  10:59 PM:  I have discussed the diagnosis/risks/treatment options with the patient and believe the pt to be eligible for discharge home to follow-up with PCP. We also discussed returning to the ED immediately if new or worsening sx occur. We discussed the sx which are most concerning (e.g., sudden worsening pain, fever, inability to tolerate by mouth) that necessitate immediate return. Medications administered to the patient during their visit and any new prescriptions provided to the patient are listed below.  Medications given during this visit Medications - No data to display   The patient appears reasonably screen and/or stabilized for discharge and I doubt any other medical condition or other [facility] requiring further screening, evaluation, or treatment in the ED at this time prior to  discharge.   Final Clinical Impression(s) / ED Diagnoses Final diagnoses:  Nonspecific chest pain    Rx / DC Orders ED Discharge Orders          Ordered    diclofenac Sodium (VOLTAREN) 1 % GEL  4 times daily        11/28/20 2009             Deno Etienne, DO 11/28/20 2259

## 2020-11-29 NOTE — Telephone Encounter (Signed)
OV notes faxed to [facility] as requested.

## 2020-11-29 NOTE — Telephone Encounter (Signed)
Called and advised pt OV notes have been faxed to [facility] at [phone] as requested.

## 2020-12-04 ENCOUNTER — Other Ambulatory Visit: Payer: Self-pay

## 2020-12-04 ENCOUNTER — Encounter: Payer: Self-pay | Admitting: Family Medicine

## 2020-12-04 ENCOUNTER — Ambulatory Visit (INDEPENDENT_AMBULATORY_CARE_PROVIDER_SITE_OTHER): Payer: Medicare Other | Admitting: Family Medicine

## 2020-12-04 VITALS — BP 136/68 | HR 68

## 2020-12-04 DIAGNOSIS — E118 Type 2 diabetes mellitus with unspecified complications: Secondary | ICD-10-CM | POA: Diagnosis not present

## 2020-12-04 DIAGNOSIS — Z1231 Encounter for screening mammogram for malignant neoplasm of breast: Secondary | ICD-10-CM | POA: Diagnosis not present

## 2020-12-04 DIAGNOSIS — Z0000 Encounter for general adult medical examination without abnormal findings: Secondary | ICD-10-CM | POA: Diagnosis not present

## 2020-12-04 DIAGNOSIS — Z993 Dependence on wheelchair: Secondary | ICD-10-CM | POA: Diagnosis not present

## 2020-12-04 DIAGNOSIS — Z2821 Immunization not carried out because of patient refusal: Secondary | ICD-10-CM

## 2020-12-04 DIAGNOSIS — Z8669 Personal history of other diseases of the nervous system and sense organs: Secondary | ICD-10-CM | POA: Insufficient documentation

## 2020-12-04 DIAGNOSIS — E1169 Type 2 diabetes mellitus with other specified complication: Secondary | ICD-10-CM

## 2020-12-04 DIAGNOSIS — Z794 Long term (current) use of insulin: Secondary | ICD-10-CM | POA: Diagnosis not present

## 2020-12-04 DIAGNOSIS — E785 Hyperlipidemia, unspecified: Secondary | ICD-10-CM | POA: Diagnosis not present

## 2020-12-04 DIAGNOSIS — R29898 Other symptoms and signs involving the musculoskeletal system: Secondary | ICD-10-CM

## 2020-12-04 DIAGNOSIS — I10 Essential (primary) hypertension: Secondary | ICD-10-CM

## 2020-12-04 DIAGNOSIS — E1121 Type 2 diabetes mellitus with diabetic nephropathy: Secondary | ICD-10-CM | POA: Diagnosis not present

## 2020-12-04 NOTE — Patient Instructions (Signed)
  Ms. Resetar , Thank you for taking time to come for your Medicare Wellness Visit. I appreciate your ongoing commitment to your health goals. Please review the following plan we discussed and let me know if I can assist you in the future.   This is a list of the screening recommended for you and due dates:  Health Maintenance  Topic Date Due   Zoster (Shingles) Vaccine (1 of 2) Never done   Colon Cancer Screening  Never done   Complete foot exam   07/25/2017   Urine Protein Check  07/25/2017   Eye exam for diabetics  12/18/2018   Mammogram  07/16/2020   COVID-19 Vaccine (3 - Pfizer risk series) 12/20/2020*   Hemoglobin A1C  02/03/2021   DEXA scan (bone density measurement)  Completed   Hepatitis C Screening: USPSTF Recommendation to screen - Ages 39-79 yo.  Completed   HPV Vaccine  Aged Out   Flu Shot  Discontinued   Tetanus Vaccine  Discontinued  *Topic was postponed. The date shown is not the original due date.

## 2020-12-04 NOTE — Progress Notes (Signed)
Cassandra Stewart is a 70 y.o. female who presents for annual wellness visit and follow-up on chronic medical conditions.  She has the following concerns  She is in an Mining engineer wheelchair. States she occasionally stands at home and holds on to her sink, furniture, etc.   Right wrist pain and swelling. She has seen UC and orthopedist. She has a brace. She forgot it today.   Recent labs and work up in the ED for neck pain that was radiating into her chest and upper back. Negative cardiac work up. She now only has mild pain in the right side of her neck and no other symptoms.   She has an appointment with Dr. Elvera Lennox for her diabetes later this week.   Reports taking her medication daily without any concerns.   States she has a history of sleep apnea and was told years ago that she needs a BiPap. States she never did get one and does not want to still. States she does not want to have another sleep study.     Immunization History  Administered Date(s) Administered   PFIZER(Purple Top)SARS-COV-2 Vaccination 09/16/2019, 10/12/2019   Pneumococcal Polysaccharide-23 04/10/2011   Last Pap smear: years ago Last mammogram: 07/17/18  Last colonoscopy: Has never had a colonoscopy, stool card last done on 03/22/20  Last DEXA:12/2017  Dentist: N/A Ophtho: Dr.Zamora 07/05/20 Exercise: clease house for 60 minutes 4 days a week  Other doctors caring for patient include:  Dr. Berline Chough- Spine doctor Dr. Wynn Banker- pain management  Dr. Elvera Lennox- diabetes Dr. Rennis Golden- Cardiology Dr. Lajoyce Corners - orthopedist seeing for right wrist injury   Depression screen:  See questionnaire below.  Depression screen [facility] 2/9 10/16/2020 08/09/2020 02/02/2020 11/03/2019 08/31/2019  Decreased Interest 0 0 0 0 0  Down, Depressed, Hopeless 0 0 0 0 0  PHQ - 2 Score 0 0 0 0 0    Fall Risk Screen: see questionnaire below. Fall Risk  12/04/2020 10/16/2020 08/09/2020 06/07/2020 02/02/2020  Falls in the past year? 1 0 0 0 0  Comment - last  fall in June 2022 - - -  Number falls in past yr: 0 0 - - 0  Comment fell in bathroom on knee was able to pull herself out of floor 3 months ago - - - -  Injury with Fall? 1 0 - - 0  Risk Factor Category  - - - - -  Risk for fall due to : History of fall(s);Impaired balance/gait;Impaired mobility - - - -  Follow up Follow up appointment - - - -    ADL screen:  See questionnaire below Functional Status Survey: Is the patient deaf or have difficulty hearing?: No Does the patient have difficulty seeing, even when wearing glasses/contacts?: Yes Does the patient have difficulty concentrating, remembering, or making decisions?: No Does the patient have difficulty walking or climbing stairs?: Yes Does the patient have difficulty dressing or bathing?: No Does the patient have difficulty doing errands alone such as visiting a doctor's office or shopping?: No   End of Life Discussion:  Patient has a living will and medical power of attorney. MOST form signed.   Review of Systems Constitutional: -fever, -chills, -sweats, -unexpected weight change, -anorexia, -fatigue Allergy: -sneezing, -itching, -congestion Dermatology: denies changing moles, rash, lumps, new worrisome lesions ENT: -runny nose, -ear pain, -sore throat, -hoarseness, -sinus pain, -teeth pain, -tinnitus, -hearing loss, -epistaxis Cardiology:  -chest pain, -palpitations, -edema, -orthopnea, -paroxysmal nocturnal dyspnea Respiratory: -cough, -shortness of breath, -dyspnea on exertion, -wheezing, -hemoptysis Gastroenterology: -  abdominal pain, -nausea, -vomiting, -diarrhea, -constipation, -blood in stool, -changes in bowel movement, -dysphagia Hematology: -bleeding or bruising problems Musculoskeletal: -arthralgias, -myalgias, -joint swelling, -back pain, +neck pain, -cramping Ophthalmology: -vision changes, -eye redness, -itching, -discharge Urology: -dysuria, -difficulty urinating, -hematuria, -urinary frequency, -urgency,  -incontinence Neurology: -headache, -weakness, -tingling, -numbness, -speech abnormality, -memory loss, +falls, -dizziness Psychology:  -depressed mood, -agitation, -sleep problems    PHYSICAL EXAM:  BP 136/68 (BP Location: Right Arm, Patient Position: Sitting)   Pulse 68   General Appearance: Alert, cooperative, no distress, appears stated age Head: Normocephalic, without obvious abnormality, atraumatic Eyes: PERRL, conjunctiva/corneas clear, EOM's intact Ears: Normal TM's and external ear canals Nose: mask on  Throat: mask on  Neck: Supple, no lymphadenopathy; thyroid: no enlargement/tenderness/nodules; no JVD Back: Spine non tender Lungs: Clear to auscultation bilaterally without wheezes, rales or ronchi; respirations unlabored Chest Wall: No tenderness or deformity Heart: Regular rate and rhythm, S1 and S2 normal, no murmur, rub or gallop Abdomen: Soft, in wheelchair Extremities: No clubbing, cyanosis or edema Pulses: 2+ and symmetric all extremities Skin: Skin color, texture, turgor normal, no rashes or lesions Lymph nodes: Cervical and supraclavicular nodes normal Neurologic: CNII-XII intact, normal strength and sensation reflexes in upper extremities. Left leg weakness (chronic) Psych: Normal mood, affect, hygiene and grooming.  ASSESSMENT/PLAN: Medicare annual wellness visit, subsequent -She is mainly in a wheelchair due to left leg weakness and spine issues.  She is under the care of several specialists.  She took [facility] access transportation to her appointment today.  Her mood is good.  She declines immunizations today.  Discussed safety.  Advance directives reviewed.  Her daughter is her healthcare power of attorney  Routine general medical examination at a health care facility -Declines labs since she will be seeing her endocrinologist later this week.  She is aware that her lipid panel is overdue.  Preventive health care reviewed.  She will call and schedule her  mammogram.  Counseled on healthy diet.  Recommend regular dental and eye exams.  Discussed safety.  Essential hypertension -Blood pressure controlled.  Continue medication and low-sodium diet  Hyperlipidemia associated with type 2 diabetes mellitus (HCC) -Continue statin therapy and low-fat diet  Morbid obesity (HCC) -Encouraged healthy diet cutting back on calories and carbohydrates  Wheelchair dependence  Weakness of left leg  Type 2 diabetes mellitus with diabetic nephropathy, unspecified whether long term insulin use (HCC) -Managed by Dr. Elvera Lennox  Encounter for screening mammogram for malignant neoplasm of breast - Plan: MM DIGITAL SCREENING BILATERAL -She will call and schedule her mammogram  Immunization declined  History of sleep apnea -Discussed potential health consequences associated with untreated sleep apnea including pulmonary artery hypertension and heart failure.  She declines a sleep study or obtaining equipment to treat her sleep apnea    Discussed monthly self breast exams and yearly mammograms; at least 30 minutes of aerobic activity at least 5 days/week and weight-bearing exercise 2x/week; proper sunscreen use reviewed; healthy diet, including goals of calcium and vitamin D intake and alcohol recommendations (less than or equal to 1 drink/day) reviewed; regular seatbelt use; changing batteries in smoke detectors.  Immunization recommendations discussed.  Colonoscopy recommendations reviewed   Medicare Attestation I have personally reviewed: The patient's medical and social history Their use of alcohol, tobacco or illicit drugs Their current medications and supplements The patient's functional ability including ADLs,fall risks, home safety risks, cognitive, and hearing and visual impairment Diet and physical activities Evidence for depression or mood disorders  The patient's weight,  height, and BMI have been recorded in the chart.  I have made referrals,  counseling, and provided education to the patient based on review of the above and I have provided the patient with a written personalized care plan for preventive services.     Hetty Blend, NP-C   12/04/2020

## 2020-12-06 ENCOUNTER — Ambulatory Visit (INDEPENDENT_AMBULATORY_CARE_PROVIDER_SITE_OTHER): Payer: Medicare Other | Admitting: Internal Medicine

## 2020-12-06 ENCOUNTER — Encounter: Payer: Self-pay | Admitting: Internal Medicine

## 2020-12-06 ENCOUNTER — Other Ambulatory Visit: Payer: Self-pay

## 2020-12-06 VITALS — BP 140/72 | HR 73 | Ht 64.0 in

## 2020-12-06 DIAGNOSIS — E782 Mixed hyperlipidemia: Secondary | ICD-10-CM | POA: Diagnosis not present

## 2020-12-06 DIAGNOSIS — E1165 Type 2 diabetes mellitus with hyperglycemia: Secondary | ICD-10-CM

## 2020-12-06 DIAGNOSIS — E1142 Type 2 diabetes mellitus with diabetic polyneuropathy: Secondary | ICD-10-CM | POA: Diagnosis not present

## 2020-12-06 DIAGNOSIS — E538 Deficiency of other specified B group vitamins: Secondary | ICD-10-CM | POA: Diagnosis not present

## 2020-12-06 LAB — LIPID PANEL
Cholesterol: 155 mg/dL (ref 0–200)
HDL: 68.1 mg/dL (ref >39.00)
LDL Cholesterol: 58 mg/dL (ref 0–99)
NonHDL: 87.07
Total CHOL/HDL Ratio: 2
Triglycerides: 145 mg/dL (ref 0.0–149.0)
VLDL: 29 mg/dL (ref 0.0–40.0)

## 2020-12-06 LAB — POCT GLYCOSYLATED HEMOGLOBIN (HGB A1C): Hemoglobin A1C: 7.1 % — AB (ref 4.0–5.6)

## 2020-12-06 LAB — VITAMIN B12: Vitamin B-12: >1550 pg/mL — ABNORMAL HIGH (ref 211–911)

## 2020-12-06 MED ORDER — INSULIN LISPRO (1 UNIT DIAL) 100 UNIT/ML (KWIKPEN): PEN_INJECTOR | SUBCUTANEOUS | 3 refills | Status: DC

## 2020-12-06 MED ORDER — METFORMIN HCL ER 500 MG PO TB24: 500.0000 mg | ORAL_TABLET | Freq: Two times a day (BID) | ORAL | 3 refills | Status: DC

## 2020-12-06 NOTE — Patient Instructions (Signed)
Please continue: - Metformin ER 500 mg 1-2x a day with meals. - Trulicity 3 mg weekly - Humalog 7-12 units before the meals, and 5 units before the snack at night  - Toujeo 40 units at night  Please continue B12 1000 mcg every other day.  Please return in 4 months.

## 2020-12-06 NOTE — Progress Notes (Signed)
Patient ID: Cassandra Stewart, female   DOB: August 16, 1950, 70 y.o.   MRN: [id number]   This visit occurred during the SARS-CoV-2 public health emergency.  Safety protocols were in place, including screening questions prior to the visit, additional usage of staff PPE, and extensive cleaning of exam room while observing appropriate contact time as indicated for disinfecting solutions.   HPI: Cassandra Stewart is a 70 y.o.-year-old female, returning for follow-up for DM2, dx in 1992, insulin-dependent since 2006, uncontrolled, with complications (CKD stage 3, PN, DR).  Last visit 4 months ago  Interim history: No increased urination, blurry vision, nausea. She was in the emergency room on 10/24/2020 for R wrist pain and swelling.  She also sees Dr. Unk Lightning.  No fracture. Has a brace. Her ulnar nerve is affected by using her home wheelchair. She was in the emergency room 11/28/2020 for chest pain.  Troponins were negative.  D-dimer was still elevated. She also has neck and back pain.   Reviewed HbA1c levels: Lab Results  Component Value Date   HGBA1C 8.5 (A) 08/04/2020   HGBA1C 9.9 (A) 04/06/2020   HGBA1C 7.4 (A) 12/02/2019   HGBA1C 9.3 (A) 07/29/2019   HGBA1C 9.6 (A) 03/30/2019   HGBA1C 9.8 (A) 12/28/2018   HGBA1C 10.7 (A) 07/13/2018   HGBA1C 6.8 (A) 03/16/2018   HGBA1C 6.1 (A) 11/12/2017   HGBA1C 9.0 07/11/2017   HGBA1C 9.0 04/14/2017   HGBA1C 8.7% 10/24/2016   HGBA1C 9.6% 07/25/2016   HGBA1C 10.5% 03/04/2016   HGBA1C 8.1% 10/25/2015   HGBA1C 9.7 (H) 07/04/2015  03/16/2018: HbA1c calculated from fructosamine 6.28%  Pt was on a regimen of: - Metformin 500 mg 1x a day with dinner.  She had diarrhea with a higher dose. - Toujeo 45 units in am - Humalog 18 units 2-3x a day, before meals Tried: Actos, Lantus.  Now on: - Metformin ER 500 mg 1-2x a day with meals. - Trulicity 1.5 >> 3 mg weekly  - Toujeo 35 >> 28 >> 36 >> 40 units at night - Humalog 7-12 units before meals and 5 units  before snack at night  She is now not checking sugars after [place] did not send her  the transmitters.  Previously:   Lowest sugar was 67 >> 49 (took insulin but forgot to eat) >> ?; she has hypoglycemia awareness in the 80s. Highest sugar was 383 >> 143 >> 296 >. ?.  Glucometer: Freestyle  Pt's meals are: - Breakfast/brunch: egg, bacon, toast >> no b'fast  - Lunch: snack mostly (no Humalog) >> Meals on Wheels - Dinner: chicken/fish + vegetables >> moved before 7 pm - Snacks: Belvita cracker if hungry  -+ Stage III CKD; last BUN/creatinine:  Lab Results  Component Value Date   BUN 20 11/28/2020   BUN 19 03/02/2020   CREATININE 1.10 (H) 11/28/2020   CREATININE 1.18 (H) 03/02/2020   Lab Results  Component Value Date   GFRAA 54 (L) 03/02/2020   GFRAA 59 (L) 08/31/2019   GFRAA 58 (L) 03/24/2019   GFRAA 59 (L) 03/12/2019   GFRAA 55 (L) 03/08/2019  Previously on lisinopril 10, but stopped due to hyperkalemia.   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 Coralyn Pear).  She has history of cataract surgery OU.  -+ Numbness and tingling in her fingers-on Neurontin 300 mg 3x a day per PCP.  On ASA 81.  Low vitamin B12:  Reviewed B12 levels: Lab Results  Component Value Date   VITAMINB12 >1526 (H) 07/29/2019   VITAMINB12 >1500 (H) 03/30/2019   VITAMINB12 >1500 (H) 12/28/2018   VITAMINB12 261 03/16/2018   VITAMINB12 300 04/14/2017   VITAMINB12 478 10/25/2015  05/01/2016: Vit B12 248.  We initially started 5000 mcg B12 daily, which was then decreased to 2500 mcg daily and, then to 1000 mcg daily.  Currently on 1000 mcg every other day. She continues to be in a wheelchair as her left leg is very weak-believed to be from a herniated intervertebral disc.   She had surgery for this in 2016 but the strength did not improve.  She has an extensive FH of heart disease - women in her family. Daughter died after her heart stopped.  ROS: + See HPI Neurological: no tremors/+ numbness/+ tingling-fingers/no dizziness  I reviewed pt's medications, allergies, PMH, social hx, family hx, and changes were documented in the history of present illness. Otherwise, unchanged from my initial visit note.  Past Medical History:  Diagnosis Date   Elevated LFTs    Fatty liver    on [place].    Hyperlipemia    Hypertension    Inguinal hernia    2020 CT   Stage 3b chronic kidney disease ([place])    Thoracic spondylosis with myelopathy    Umbilical hernia    3614 CT   Uncontrolled type 2 diabetes with neuropathy    Wheelchair bound    paraplegia of Leg - Left   Past Surgical History:  Procedure Laterality Date   APPENDECTOMY  1969   BREAST BIOPSY Right    CATARACT EXTRACTION     May 21016   LUMBAR DISC SURGERY     LUMBAR LAMINECTOMY/DECOMPRESSION MICRODISCECTOMY N/A 07/06/2015   Procedure: Thoracic ten- eleven laminectomy for spinal canal decompression;  Surgeon: Ashok Pall, MD;  Location: [facility];  Service: Orthopedics;  Laterality: N/A;   Social History   Social History   Marital status: Divorced    Spouse name: N/A   Number of children: 0   Occupational History   None   Social History Main Topics   Smoking status: Never Smoker   Smokeless tobacco: Never Used   Alcohol use No   Drug use: No   Current Outpatient Medications on File Prior to Visit  Medication Sig Dispense Refill   Accu-Chek FastClix Lancets MISC USE FOUR TIMES DAILY AS DIRECTED 200 each 3   ACCU-CHEK GUIDE test strip USE TO TEST BLOOD SUGAR FOUR TIMES DAILY AS DIRECTED 400 strip 1   acetaminophen (TYLENOL) 650 MG CR tablet Take 650 mg by mouth every 8 (eight) hours as needed for pain.     albuterol (VENTOLIN HFA) 108 (90 Base) MCG/ACT inhaler Inhale 1-2 puffs into the lungs  every 4 (four) hours as needed for wheezing or shortness of breath.     amLODipine (NORVASC) 10 MG tablet TAKE 1 TABLET(10 MG) BY MOUTH EVERY MORNING 90 tablet 0   aspirin EC 81 MG tablet Take 81 mg by mouth every evening.     Blood Glucose Monitoring Suppl (ACCU-CHEK AVIVA PLUS) w/Device KIT Use as advised 1 kit 1   Cholecalciferol (VITAMIN D PO) Take 1,000 mg by mouth daily.      Continuous Blood Gluc Receiver ([place]) DEVI by  Does not apply route.     Cyanocobalamin (VITAMIN B 12 PO) Take 5,000 mg by mouth daily.     diclofenac Sodium (VOLTAREN) 1 % GEL Apply 4 g topically 4 (four) times daily. 100 g 0   dicyclomine (BENTYL) 10 MG capsule Take 1 capsule (10 mg total) by mouth every 8 (eight) hours as needed for spasms. 60 capsule 1   Dulaglutide (TRULICITY) 3 XI/3.3AS SOPN Inject 3 mg into the skin once a week. 6 mL 3   gabapentin (NEURONTIN) 300 MG capsule TAKE 1 CAPSULE(300 MG) BY MOUTH THREE TIMES DAILY 270 capsule 1   hydrochlorothiazide (MICROZIDE) 12.5 MG capsule TAKE 1 CAPSULE(12.5 MG) BY MOUTH DAILY 90 capsule 0   insulin lispro (HUMALOG) 100 UNIT/ML KwikPen INJECT 7-10 UNITS UNDER THE SKIN 2-3 TIMES DAILY 30 mL 3   Insulin Pen Needle (CAREFINE PEN NEEDLES) 32G X 4 MM MISC Use 4x a day 300 each 3   lidocaine (XYLOCAINE) 5 % ointment Apply 1 application topically 4 (four) times daily as needed. For fingers tips pain 50 g 5   lovastatin (MEVACOR) 40 MG tablet TAKE 1 TABLET(40 MG) BY MOUTH AT BEDTIME 90 tablet 0   metFORMIN (GLUCOPHAGE-XR) 500 MG 24 hr tablet Take 1 tablet (500 mg total) by mouth 2 (two) times daily. 180 tablet 3   nitroGLYCERIN (NITROSTAT) 0.4 MG SL tablet Place 1 tablet (0.4 mg total) under the tongue every 5 (five) minutes as needed for chest pain. 25 tablet 3   polyethylene glycol (MIRALAX / GLYCOLAX) 17 g packet Take 17 g by mouth as needed.     TOUJEO SOLOSTAR 300 UNIT/ML Solostar Pen Inject 40 Units into the skin at bedtime. 12 mL 3   traMADol (ULTRAM)  50 MG tablet Take 1 tablet (50 mg total) by mouth every 6 (six) hours as needed. 28 tablet 0   No current facility-administered medications on file prior to visit.   Allergies  Allergen Reactions   Other     PT PREFERS TO NOT HAVE ANY NARCOTIC MEDICATIONS   Chlorhexidine Rash    Burning after using CHG wipes-used for surgery   Oxycodone Hcl     Other reaction(s): Hallucination Marked hallucinations and palpitations following dose of 49m on 04/30/2015    Family History  Problem Relation Age of Onset   Diabetes Mother    Heart disease Mother    Pancreatic cancer Sister    Kidney disease Brother        Two brothers on ESRD   Amblyopia Neg Hx    Blindness Neg Hx    Cataracts Neg Hx    Glaucoma Neg Hx    Macular degeneration Neg Hx    Retinal detachment Neg Hx    Strabismus Neg Hx    Retinitis pigmentosa Neg Hx    Colon cancer Neg Hx    Esophageal cancer Neg Hx    Pt has FH of DM in M, MGM, PGM, M aunt, uncles.  PE: BP 140/72 (BP Location: Left Arm, Patient Position: Sitting, Cuff Size: Normal)   Pulse 73   Ht _0  (1.626 m)   SpO2 95%   BMI 34.33 kg/m Now in wheelchair - now weighed today. Wt Readings from Last 3 Encounters:  11/28/20 200 lb (90.7 kg)  08/09/20 217 lb (98.4 kg)  08/04/20 217 lb (98.4 kg)   Constitutional: overweight, in NAD, in wheelchair Eyes: PERRLA, EOMI, no exophthalmos ENT: moist mucous membranes, no thyromegaly, no cervical lymphadenopathy Cardiovascular: RRR, No MRG, +  L ankle edema - pitting after fall 2017 Respiratory: CTA B Gastrointestinal: abdomen soft, NT, ND, BS+ Musculoskeletal: no deformities, strength intact in all 4 Skin: moist, warm, no rashes Neurological: no tremor with outstretched hands, DTR normal in all 4  ASSESSMENT: 1. DM2, insulin-dependent, uncontrolled, with complications - CKD stage 3 - PN - DR  - Of note, we tried to prescribe the Freestyle libre CGM for her but this was not covered by the insurance  2. Low  B12  3. HL  PLAN:  1. Patient with longstanding, type 2 diabetes, on oral antidiabetic regimen and also basal-bolus insulin regimen and weekly GLP-1 receptor agonist, with still poor control.  She had very good success on a previous diet which caused her to lose 40 pounds and improve her sugars significantly but then she came off the diet and sugars worsens.  HbA1c increased to 9.9%.  At that time, we increased Toujeo and restarted Trulicity.  She was planning to go back to the previous diet.  Since last visit, she lost 17 pounds! -At last visit, reviewing her CGM trends, they are hovering around the upper limit of normal at all times of the day but slightly higher after lunch and after dinner.  She was having a snack after dinner which she was not covering with insulin and I advised her to do so if she continues to have this now, but ideally she would stop this.  We did increase her Toujeo dose at that time. -At this visit, she unfortunately does not have the CGM, since [place] did not send her the transmitter.  She did not check sugars at all in this period.  However, before this, sugars were improving.  She is compliant with her regimen but she has been off Trulicity for about 3 weeks as she was not able to refill it.  She is now back on it.  She tolerates it well. -At today's visit we gave her a sample Dexcom sensor and transmitter - I suggested to:  Patient Instructions  Please continue: - Metformin ER 500 mg 1-2x a day with meals. - Trulicity 3 mg weekly - Humalog 7-12 units before the meals, and 5 units before the snack at night  - Toujeo 40 units at night  Please continue B12 1000 mcg every other day.  Please return in 4 months.   - we checked her HbA1c: 7.1% (better!) - advised to check sugars at different times of the day - 4x a day, rotating check times - advised for yearly eye exams >> she is UTD - she has peripheral neuropathy for which she is on a higher dose Neurontin -  refills per PCP. - return to clinic in 4 months  2. Low B12 -She has a history of low B12 for which we started supplementation with p.o. B12 vitamin -She is now taking 1000 mcg every other day -She needs another level checked -we will do so today  3. HL -Reviewed latest lipid panel from 08/2019: Excellent lipid fractions: Lab Results  Component Value Date   CHOL 152 08/31/2019   HDL 64 08/31/2019   LDLCALC 67 08/31/2019   TRIG 121 08/31/2019   CHOLHDL 2.4 08/31/2019  -She continues on Mevacor 40 mg daily without side effects -She is due for annual lipid panel - will check today - fasting  Component     Latest Ref Rng & Units 12/06/2020  Cholesterol     0 - 200 mg/dL 155  Triglycerides  0.0 - 149.0 mg/dL 145.0  HDL Cholesterol     >39.00 mg/dL 68.10  VLDL     0.0 - 40.0 mg/dL 29.0  LDL (calc)     0 - 99 mg/dL 58  Total CHOL/HDL Ratio      2  NonHDL      87.07  Hemoglobin A1C     4.0 - 5.6 % 7.1 (A)  Vitamin B12     211 - 911 pg/mL >1550 (H)   Lipid panel at goal.  Vitamin B12 is still high.  At this point, I will ask her to reduce the B12 supplement to only twice a week.  Philemon Kingdom, MD PhD [facility] Endocrinology

## 2020-12-07 NOTE — Progress Notes (Signed)
Attempted to contact the patient at 1:39 in regards to lab results, LVM for a call back when possible.

## 2020-12-09 ENCOUNTER — Other Ambulatory Visit: Payer: Self-pay | Admitting: Family Medicine

## 2020-12-09 DIAGNOSIS — I10 Essential (primary) hypertension: Secondary | ICD-10-CM

## 2020-12-10 ENCOUNTER — Other Ambulatory Visit: Payer: Self-pay | Admitting: Family Medicine

## 2020-12-10 DIAGNOSIS — E782 Mixed hyperlipidemia: Secondary | ICD-10-CM

## 2020-12-11 ENCOUNTER — Other Ambulatory Visit: Payer: Self-pay | Admitting: Internal Medicine

## 2020-12-11 ENCOUNTER — Other Ambulatory Visit: Payer: Self-pay | Admitting: Family Medicine

## 2020-12-11 DIAGNOSIS — I10 Essential (primary) hypertension: Secondary | ICD-10-CM

## 2020-12-11 MED ORDER — HYDROCHLOROTHIAZIDE 12.5 MG PO CAPS: ORAL_CAPSULE | ORAL | 0 refills | Status: DC

## 2020-12-11 MED ORDER — GABAPENTIN 300 MG PO CAPS: ORAL_CAPSULE | ORAL | 1 refills | Status: DC

## 2020-12-11 MED ORDER — AMLODIPINE BESYLATE 10 MG PO TABS: ORAL_TABLET | ORAL | 0 refills | Status: DC

## 2020-12-11 NOTE — Telephone Encounter (Signed)
Pt called for refills of lovastatin, gabapentin, amlodipine and HCTZ. Please send to [facility].

## 2020-12-11 NOTE — Telephone Encounter (Signed)
Rx pended  for review ( primarily gabapentin) Patient last saw vickie this month for medicare visit and did not mention refills

## 2020-12-18 ENCOUNTER — Ambulatory Visit: Payer: Medicare Other | Admitting: Family Medicine

## 2021-01-04 DIAGNOSIS — Z794 Long term (current) use of insulin: Secondary | ICD-10-CM | POA: Diagnosis not present

## 2021-01-04 DIAGNOSIS — E118 Type 2 diabetes mellitus with unspecified complications: Secondary | ICD-10-CM | POA: Diagnosis not present

## 2021-01-12 ENCOUNTER — Encounter: Payer: Self-pay | Admitting: Physical Medicine and Rehabilitation

## 2021-01-12 ENCOUNTER — Other Ambulatory Visit: Payer: Self-pay

## 2021-01-12 ENCOUNTER — Encounter
Payer: Medicare Other | Attending: Physical Medicine and Rehabilitation | Admitting: Physical Medicine and Rehabilitation

## 2021-01-12 VITALS — BP 155/76 | HR 76 | Ht 64.0 in

## 2021-01-12 DIAGNOSIS — G8220 Paraplegia, unspecified: Secondary | ICD-10-CM | POA: Diagnosis not present

## 2021-01-12 DIAGNOSIS — M25531 Pain in right wrist: Secondary | ICD-10-CM | POA: Insufficient documentation

## 2021-01-12 DIAGNOSIS — G8222 Paraplegia, incomplete: Secondary | ICD-10-CM | POA: Insufficient documentation

## 2021-01-12 DIAGNOSIS — Z993 Dependence on wheelchair: Secondary | ICD-10-CM | POA: Insufficient documentation

## 2021-01-12 NOTE — Patient Instructions (Signed)
Pt is a 70 yr old R handed female  With hx of HTN, DM- poorly controlled per chart; obesity- BMI >35, HLD, Vit B levels are high- with hx of thoracic spondylosis/myelopathy and LE weakness- mainly LLE weak.  Here for f/u on weakness. w/c dependent. In power w/c.  Also new R wrist pain- ulnar styloid pain/TTP Here for f/u on Thoracic nontraumatic myelopathy   Suggest a gel pad for computer desks- use for armrests on w/c. Since cannot get w/c company to fix.   2. Needs to call [place] health care and they can refer you to a new PCP- my suggestions to get a doctor, not NP, because you have complicated medical issues. Can also look into geriatrics.    3.  Needs to take Miralax or Senokot- start with 1 tab 2x/day or 2 tabs 1x/day- max dose is tabs/day.  If has hard stool, also get Colace- daily as well for hard pebbles.  Get generic.   4. Have pt call Dr Audrie Lia to see if needs MRI, etc- since wrist still extremely painful, limiting function, and cannot put weight through R wrist.   5. F/U in 3 months- double visit.

## 2021-01-12 NOTE — Progress Notes (Deleted)
Subjective:    Patient ID: Cassandra Stewart, female    DOB: 16-Dec-1950, 70 y.o.   MRN: [id number]  HPI Pain Inventory Average Pain 6 Pain Right Now 7 My pain is aching  LOCATION OF PAIN  shoulder, wrist  BOWEL Number of stools per week: 1   BLADDER Normal    Mobility ability to climb steps?  no do you drive?  no use a wheelchair Do you have any goals in this area?  yes  Function retired  Neuro/Psych weakness numbness tingling trouble walking  Prior Studies Any changes since last visit?  no  Physicians involved in your care Any changes since last visit?  no   Family History  Problem Relation Age of Onset   Diabetes Mother    Heart disease Mother    Pancreatic cancer Sister    Kidney disease Brother        Two brothers on ESRD   Amblyopia Neg Hx    Blindness Neg Hx    Cataracts Neg Hx    Glaucoma Neg Hx    Macular degeneration Neg Hx    Retinal detachment Neg Hx    Strabismus Neg Hx    Retinitis pigmentosa Neg Hx    Colon cancer Neg Hx    Esophageal cancer Neg Hx    Social History   Socioeconomic History   Marital status: Divorced    Spouse name: Not on file   Number of children: Not on file   Years of education: Not on file   Highest education level: Not on file  Occupational History   Not on file  Tobacco Use   Smoking status: Never   Smokeless tobacco: Never  Vaping Use   Vaping Use: Never used  Substance and Sexual Activity   Alcohol use: No   Drug use: No   Sexual activity: Not Currently  Other Topics Concern   Not on file  Social History Narrative   Not on file   Social Determinants of Health   Financial Resource Strain: Not on file  Food Insecurity: Not on file  Transportation Needs: Not on file  Physical Activity: Not on file  Stress: Not on file  Social Connections: Not on file   Past Surgical History:  Procedure Laterality Date   APPENDECTOMY  1969   BREAST BIOPSY Right    CATARACT EXTRACTION     May 32202    LUMBAR DISC SURGERY     LUMBAR LAMINECTOMY/DECOMPRESSION MICRODISCECTOMY N/A 07/06/2015   Procedure: Thoracic ten- eleven laminectomy for spinal canal decompression;  Surgeon: Coletta Memos, MD;  Location: [facility];  Service: Orthopedics;  Laterality: N/A;   Past Medical History:  Diagnosis Date   Elevated LFTs    Fatty liver    on [place].    Hyperlipemia    Hypertension    Inguinal hernia    2020 CT   Stage 3b chronic kidney disease (HCC)    Thoracic spondylosis with myelopathy    Umbilical hernia    2020 CT   Uncontrolled type 2 diabetes with neuropathy    Wheelchair bound    paraplegia of Leg - Left   BP (!) 155/76   Pulse 76   Ht 5\' 4"  (1.626 m)   SpO2 98%   BMI 34.33 kg/m   Opioid Risk Score:   Fall Risk Score:  `1  Depression screen PHQ 2/9  Depression screen [facility] 2/9 01/12/2021 10/16/2020 08/09/2020 02/02/2020 11/03/2019 08/31/2019 08/04/2019  Decreased Interest 0 0 0 0  0 0 0  Down, Depressed, Hopeless 0 0 0 0 0 0 0  PHQ - 2 Score 0 0 0 0 0 0 0             Objective:   Physical Exam        Assessment & Plan:

## 2021-01-12 NOTE — Progress Notes (Addendum)
Subjective:    Patient ID: Cassandra Stewart, female    DOB: April 05, 1950, 70 y.o.   MRN: [id number]  HPI    Pt is a 70 yr old R handed female  With hx of HTN, DM- poorly controlled per chart; obesity- BMI >35, HLD, Vit B levels are high- with hx of thoracic spondylosis/myelopathy and LE weakness- mainly LLE weak.  Here for f/u on weakness. w/c dependent. In power w/c.  Also new R wrist pain- ulnar styloid pain/TTP Here for f/u on Thoracic nontraumatic myelopathy   Was referred to Ortho- Dr Lajoyce Corners- who told her R wrsit wasn't fractured- The pain is still really bad- cannot clean with R hand when goes to bathroom.  Pain very slightly better, but still shoots pain up to jaw, and still swollen.  Makes it very difficult to function.  Makes it harder to use joystick due to wrist pain.   On manual w/c- is 70 year old- came from [employer]- has no coverage over armrests for manual w/c.  Uses manual w/c in home in apartment- is smaller and gets around.   Has been to ER for sharp pain in B/L sides of neck- very tight as well.  Worried about cause of pain.    [employer]- has fixed it stopping. Has [employer]- so cannot get [employer] to get her new w/c.  Put on hardship- and cut the price in half- using new w/c now- Paid $87 /monthly to [employer].    Needs new PCP but wanted me to be PCP.   New w/c is helping back pain, a lot- not resolved but much better but buttock pain is a little worse- switches back and forth between manual and power w/c.  Uses a true comfort gel/vs memory foam cushion that's molded.  Both w/c's have their own cushions.   Got tramadol at last appointment- hasn't taken 1x- takes gabapentin and tylenol "her pain cocktail".    A lot of trouble with constipation.  Needs something stronger-  Usually goes 1x/week.   Pain Inventory Average Pain 6 Pain Right Now 7 My pain is constant and aching  LOCATION OF PAIN  shoulder, wrist  BOWEL Number of stools per week:  1   BLADDER Normal    Mobility ability to climb steps?  no do you drive?  no use a wheelchair Do you have any goals in this area?  yes  Function retired  Neuro/Psych weakness numbness tingling trouble walking  Prior Studies Any changes since last visit?  no  Physicians involved in your care Any changes since last visit?  no   Family History  Problem Relation Age of Onset   Diabetes Mother    Heart disease Mother    Pancreatic cancer Sister    Kidney disease Brother        Two brothers on ESRD   Amblyopia Neg Hx    Blindness Neg Hx    Cataracts Neg Hx    Glaucoma Neg Hx    Macular degeneration Neg Hx    Retinal detachment Neg Hx    Strabismus Neg Hx    Retinitis pigmentosa Neg Hx    Colon cancer Neg Hx    Esophageal cancer Neg Hx    Social History   Socioeconomic History   Marital status: Divorced    Spouse name: Not on file   Number of children: Not on file   Years of education: Not on file   Highest education level: Not on file  Occupational History  Not on file  Tobacco Use   Smoking status: Never   Smokeless tobacco: Never  Vaping Use   Vaping Use: Never used  Substance and Sexual Activity   Alcohol use: No   Drug use: No   Sexual activity: Not Currently  Other Topics Concern   Not on file  Social History Narrative   Not on file   Social Determinants of Health   Financial Resource Strain: Not on file  Food Insecurity: Not on file  Transportation Needs: Not on file  Physical Activity: Not on file  Stress: Not on file  Social Connections: Not on file   Past Surgical History:  Procedure Laterality Date   APPENDECTOMY  1969   BREAST BIOPSY Right    CATARACT EXTRACTION     May 21016   LUMBAR [place] LAMINECTOMY/DECOMPRESSION MICRODISCECTOMY N/A 07/06/2015   Procedure: Thoracic ten- eleven laminectomy for spinal canal decompression;  Surgeon: Ashok Pall, MD;  Location: [facility];  Service: Orthopedics;   Laterality: N/A;   Past Medical History:  Diagnosis Date   Elevated LFTs    Fatty liver    on [place].    Hyperlipemia    Hypertension    Inguinal hernia    2020 CT   Stage 3b chronic kidney disease ([place])    Thoracic spondylosis with myelopathy    Umbilical hernia    [id number] CT   Uncontrolled type 2 diabetes with neuropathy    Wheelchair bound    paraplegia of Leg - Left   BP (!) 155/76   Pulse 76   Ht 5\' 4"  (1.626 m)   SpO2 98%   BMI 34.33 kg/m   Opioid Risk Score:   Fall Risk Score:  `1  Depression screen PHQ 2/9  Depression screen [facility] 2/9 01/12/2021 10/16/2020 08/09/2020 02/02/2020 11/03/2019 08/31/2019 08/04/2019  Decreased Interest 0 0 0 0 0 0 0  Down, Depressed, Hopeless 0 0 0 0 0 0 0  PHQ - 2 Score 0 0 0 0 0 0 0     Review of Systems  Constitutional: Negative.   HENT: Negative.    Eyes: Negative.   Respiratory: Negative.    Cardiovascular: Negative.   Gastrointestinal: Negative.   Endocrine: Negative.   Genitourinary: Negative.   Musculoskeletal:  Positive for gait problem.  Skin: Negative.   Allergic/Immunologic: Negative.   Neurological:  Positive for weakness and numbness.  Hematological: Negative.   Psychiatric/Behavioral: Negative.        Objective:   Physical Exam  Awake, alert, appropriate, has multiple concerns, NAD R wrist somewhat swollen- nonpitting edema- and extremely painful/TTP most TTP over ulnar aspect of R wrist on dorsum.        Assessment & Plan:   Pt is a 70 yr old R handed female  With hx of HTN, DM- poorly controlled per chart; obesity- BMI >35, HLD, Vit B levels are high- with hx of thoracic spondylosis/myelopathy and LE weakness- mainly LLE weak.  Here for f/u on weakness. w/c dependent. In power w/c.  Also new R wrist pain- ulnar styloid pain/TTP Here for f/u on Thoracic nontraumatic myelopathy   Suggest a gel pad for computer desks- use for armrests on w/c. Since cannot get w/c company to fix.   2. Needs to call [place] health  care and they can refer you to a new PCP- my suggestions to get a doctor, not NP, because you have complicated medical issues. Can also look into geriatrics.  3.  Needs to take Miralax or Senokot- start with 1 tab 2x/day or 2 tabs 1x/day- max dose is tabs/day.  If has hard stool, also get Colace- daily as well for hard pebbles.  Get generic.   4. Have pt call Dr Jess Barters to see if needs MRI, etc- since wrist still extremely painful, limiting function, and cannot put weight through R wrist.   5. F/U in 3 months- double visit. SCI  6. Will do trigger point injections at next visit- use lidocaine patches 8 pm to 8am.    I spent a total of 31 minutes on total visit- discussing calling Dr Sharol Given, and going over bowel issues as well as PCP issues- cannot be pt's PCP.

## 2021-01-26 ENCOUNTER — Telehealth: Payer: Self-pay | Admitting: Internal Medicine

## 2021-01-26 DIAGNOSIS — E1142 Type 2 diabetes mellitus with diabetic polyneuropathy: Secondary | ICD-10-CM

## 2021-01-26 MED ORDER — TRULICITY 3 MG/0.5ML ~~LOC~~ SOAJ: 3.0000 mg | SUBCUTANEOUS | 1 refills | Status: DC

## 2021-01-26 NOTE — Telephone Encounter (Signed)
MEDICATION: Dulaglutide (TRULICITY) 3 MG/0.5ML SOPN  PHARMACY:   [id number] - [place], [place] - [address] AT [facility] OF [address] Phone:  [phone]  Fax:  [phone]      HAS THE PATIENT CONTACTED THEIR PHARMACY?  Yes-requires new RX  IS THIS A 90 DAY SUPPLY : Yes  IS PATIENT OUT OF MEDICATION: Yes  IF NOT; HOW MUCH IS LEFT: 0  LAST APPOINTMENT DATE: @10 /17/2022  NEXT APPOINTMENT DATE:@2 /13/2023  DO WE HAVE YOUR PERMISSION TO LEAVE A DETAILED MESSAGE?: Yes  OTHER COMMENTS:    **Let patient know to contact pharmacy at the end of the day to make sure medication is ready. **  ** Please notify patient to allow 48-72 hours to process**  **Encourage patient to contact the pharmacy for refills or they can request refills through [facility]**

## 2021-01-26 NOTE — Telephone Encounter (Signed)
Pharmacy closed previous rx in error. New rx sent to preferred pharmacy.

## 2021-02-03 DIAGNOSIS — E118 Type 2 diabetes mellitus with unspecified complications: Secondary | ICD-10-CM | POA: Diagnosis not present

## 2021-02-03 DIAGNOSIS — Z794 Long term (current) use of insulin: Secondary | ICD-10-CM | POA: Diagnosis not present

## 2021-02-07 ENCOUNTER — Telehealth: Payer: Self-pay

## 2021-02-07 NOTE — Telephone Encounter (Signed)
Inbound fax requesting forms be completed and faxed with recent clinical notes. Forms completed and faxed to [employer] at [phone]  

## 2021-02-08 ENCOUNTER — Encounter: Payer: Self-pay | Admitting: Orthopedic Surgery

## 2021-02-08 ENCOUNTER — Ambulatory Visit (INDEPENDENT_AMBULATORY_CARE_PROVIDER_SITE_OTHER): Payer: Medicare Other | Admitting: Orthopedic Surgery

## 2021-02-08 DIAGNOSIS — S6981XA Other specified injuries of right wrist, hand and finger(s), initial encounter: Secondary | ICD-10-CM | POA: Diagnosis not present

## 2021-02-08 NOTE — Progress Notes (Signed)
Office Visit Note   Patient: Cassandra Stewart           Date of Birth: 06-18-1950           MRN: [id number] Visit Date: 02/08/2021              Requested by: Irene Pap, PA-C [address] [place],  [place] [address] PCP: Irene Pap, [place]  Chief Complaint  Patient presents with   Right Wrist - Pain      HPI: Patient is a 70 year old woman who presents in follow-up for TFCC sprain sprain.  She has been wearing a Velcro splint and using topical anti-inflammatories without relief.  Patient states is painful to even hold a toothbrush or fork.  She states she has been symptomatic for several months with swelling.  Assessment & Plan: Visit Diagnoses:  1. 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 Tempie Donning for evaluation for any intervention for her TFCC.  Follow-Up Instructions: Return if symptoms worsen or fail to improve, for Follow-up with Dr. Tempie Donning.   Ortho Exam  Patient is alert, oriented, no adenopathy, well-dressed, normal affect, normal respiratory effort. Examination patient's right wrist she does have swelling she has tenderness to palpation along the ulnar border she has no pain over the dorsum of the forearm.  The medial lateral epicondyles are nontender to palpation she has the worst pain reproduced with ulnar grind as well as palpation over the TFCC the first dorsal extensor compartment is not tender to palpation.  She does have some tenderness to palpation of the scaphoid scapholunate but mostly painful over the TFCC.  Patient ambulates in a motorized wheelchair.  Most recent hemoglobin A1c 7.1.  Imaging: No results found. No images are attached to the encounter.  Labs: Lab Results  Component Value Date   HGBA1C 7.1 (A) 12/06/2020   HGBA1C 8.5 (A) 08/04/2020   HGBA1C 9.9 (A) 04/06/2020   REPTSTATUS 07/06/2015 FINAL  07/03/2015   CULT >=100,000 COLONIES/mL ESCHERICHIA COLI (A) 07/03/2015   LABORGA ESCHERICHIA COLI (A) 07/03/2015     Lab Results  Component Value Date   ALBUMIN 4.1 03/02/2020   ALBUMIN 4.2 08/31/2019   ALBUMIN 4.1 09/18/2018    No results found for: MG Lab Results  Component Value Date   VD25OH 33 03/04/2016   VD25OH 28 (L) 10/25/2015    No results found for: PREALBUMIN CBC EXTENDED Latest Ref Rng & Units 11/28/2020 03/02/2020 08/31/2019  WBC 4.0 - 10.5 K/uL 7.3 7.0 6.2  RBC 3.87 - 5.11 MIL/uL 4.41 4.47 4.22  HGB 12.0 - 15.0 g/dL 12.8 12.7 12.3  HCT 36.0 - 46.0 % 39.7 38.4 37.2  PLT 150 - 400 K/uL 308 297 291  NEUTROABS 1.4 - 7.0 x10E3/uL - 4.2 2.8  LYMPHSABS 0.7 - 3.1 x10E3/uL - 2.1 2.7     There is no height or weight on file to calculate BMI.  Orders:  No orders of the defined types were placed in this encounter.  No orders of the defined types were placed in this encounter.    Procedures: No procedures performed  Clinical Data: No additional findings.  ROS:  All other systems negative, except as noted in the HPI. Review of Systems  Objective: Vital Signs: There were no vitals taken for this visit.  Specialty Comments:  No specialty comments available.  PMFS History: Patient Active Problem List  Diagnosis Date Noted   Paraplegia (HCC) 01/12/2021   History of sleep apnea 12/04/2020   Swelling of limb 10/16/2020   Nerve pain 06/07/2020   Hyperlipidemia associated with type 2 diabetes mellitus (HCC) 03/02/2020   Atherosclerosis of aorta (HCC) 03/02/2020   Wheelchair dependence 08/04/2019   Thoracic spondylosis with myelopathy 08/04/2019   Bilateral carpal tunnel syndrome 07/06/2019   Family history of heart disease 12/04/2018   At risk for heart disease 12/04/2018   Abdominal pain, chronic, left lower quadrant 08/25/2018   Personal history of noncompliance with medical treatment, presenting hazards to health 01/29/2017   Low serum vitamin B12  11/15/2016   CKD (chronic kidney disease) stage 3, GFR 30-59 ml/min (HCC) 10/24/2016   Fatty liver disease, nonalcoholic 10/24/2016   Hyperlipemia    Estrogen deficiency 10/25/2015   Thoracic spinal stenosis 07/06/2015   UTI (lower urinary tract infection) 07/04/2015   Poorly controlled type 2 diabetes mellitus with peripheral neuropathy (HCC) 07/04/2015   Essential hypertension 07/04/2015   Morbid obesity (HCC) 07/04/2015   Inability to walk 07/04/2015   Weakness of left leg 07/04/2015   Elevated LFTs 07/04/2015   Past Medical History:  Diagnosis Date   Elevated LFTs    Fatty liver    on [place].    Hyperlipemia    Hypertension    Inguinal hernia    2020 CT   Stage 3b chronic kidney disease (HCC)    Thoracic spondylosis with myelopathy    Umbilical hernia    2020 CT   Uncontrolled type 2 diabetes with neuropathy    Wheelchair bound    paraplegia of Leg - Left    Family History  Problem Relation Age of Onset   Diabetes Mother    Heart disease Mother    Pancreatic cancer Sister    Kidney disease Brother        Two brothers on ESRD   Amblyopia Neg Hx    Blindness Neg Hx    Cataracts Neg Hx    Glaucoma Neg Hx    Macular degeneration Neg Hx    Retinal detachment Neg Hx    Strabismus Neg Hx    Retinitis pigmentosa Neg Hx    Colon cancer Neg Hx    Esophageal cancer Neg Hx     Past Surgical History:  Procedure Laterality Date   APPENDECTOMY  1969   BREAST BIOPSY Right    CATARACT EXTRACTION     May [address]   LUMBAR DISC SURGERY     LUMBAR LAMINECTOMY/DECOMPRESSION MICRODISCECTOMY N/A 07/06/2015   Procedure: Thoracic ten- eleven laminectomy for spinal canal decompression;  Surgeon: Coletta Memos, MD;  Location: [facility];  Service: Orthopedics;  Laterality: N/A;   Social History   Occupational History   Not on file  Tobacco Use   Smoking status: Never   Smokeless tobacco: Never  Vaping Use   Vaping Use: Never used  Substance and Sexual Activity   Alcohol use: No    Drug use: No   Sexual activity: Not Currently

## 2021-02-12 DIAGNOSIS — Z794 Long term (current) use of insulin: Secondary | ICD-10-CM | POA: Diagnosis not present

## 2021-02-12 DIAGNOSIS — E118 Type 2 diabetes mellitus with unspecified complications: Secondary | ICD-10-CM | POA: Diagnosis not present

## 2021-03-01 ENCOUNTER — Encounter: Payer: Self-pay | Admitting: Orthopedic Surgery

## 2021-03-01 ENCOUNTER — Ambulatory Visit (INDEPENDENT_AMBULATORY_CARE_PROVIDER_SITE_OTHER): Payer: Commercial Managed Care - HMO | Admitting: Orthopedic Surgery

## 2021-03-01 ENCOUNTER — Other Ambulatory Visit: Payer: Self-pay

## 2021-03-01 DIAGNOSIS — M778 Other enthesopathies, not elsewhere classified: Secondary | ICD-10-CM | POA: Diagnosis not present

## 2021-03-01 DIAGNOSIS — R200 Anesthesia of skin: Secondary | ICD-10-CM

## 2021-03-01 DIAGNOSIS — R202 Paresthesia of skin: Secondary | ICD-10-CM

## 2021-03-01 HISTORY — DX: Anesthesia of skin: R20.0

## 2021-03-01 NOTE — Progress Notes (Signed)
Office Visit Note   Patient: Cassandra Stewart           Date of Birth: 20-Dec-1950           MRN: [id number] Visit Date: 03/01/2021              Requested by: Irene Pap, PA-C [place],  [place] [address] PCP: Irene Pap, PA-C   Assessment & Plan: Visit Diagnoses:  1. Extensor carpi ulnaris tendinitis   2. Numbness and tingling in right hand     Plan: Patient's ulnar dorsal wrist pain seems to be most consistent with ECU tendinitis.  We discussed different treatment options including topical and oral anti-inflammatories, oral corticosteroids, corticosteroid injection, and hand therapy.  Given her chronic kidney disease we should avoid oral anti-inflammatory medications.  She has been using diclofenac gel.  Given her diabetes I think oral corticosteroids is also not the best option right now.  I will refer to hand therapy to have a splint made and use different pain modalities.  We also discussed potentially an ultrasound-guided corticosteroid injection into the ECU sheath but she is on instruments at this time.  Regarding her numbness and tingling, she wants to treat the dorsal ulnar wrist pain first and then address the numbness.  I can see her back in several weeks to see how she is progress with therapy.  Follow-Up Instructions: No follow-ups on file.   Orders:  No orders of the defined types were placed in this encounter.  No orders of the defined types were placed in this encounter.     Procedures: No procedures performed   Clinical Data: No additional findings.   Subjective: Chief Complaint  Patient presents with   Right Wrist - Follow-up    This is a 71 year old right-hand-dominant female who presents with dorsal ulnar right wrist pain and numbness and tingling in the thumb, index, and middle fingers.  Wrist pain started in March after ground-level fall on a wet bath mat.  She was told by an urgent care that she had a fracture was  placed in a cast.  This was transitioned to a splint as the cast slipped off.  In August she did work noticed worsening pain in her hand.  Most of his pain is at the dorsal and ulnar aspect of the wrist and distal forearm.  She has some pain with terminal supination but no pain with pronation.  She has no subjective DRUJ instability.  She denies any clicking or catching or mechanical symptoms in the wrist.  She also describes to the decree sensation of the thumb, index finger, middle finger and ring finger mostly at the tips.  She also notes diffuse hand weakness.  She has pain that shoots up her dorsal arm with extension of the ring and small fingers.  She is tried topical diclofenac gel.  She is otherwise not had any treatment aside from a Velcro wrist brace.  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  She uses a IT trainer wheelchair most the time but occasionally uses a manual wheelchair.  She lives alone at home.   Review of Systems   Objective: Vital Signs: BP 131/80 (BP Location: Left Arm, Patient Position: Sitting, Cuff Size: Large)    Pulse 74    SpO2 95%   Physical Exam Constitutional:      Appearance: Normal appearance.  Cardiovascular:     Rate and Rhythm: Normal rate.     Pulses: Normal pulses.  Pulmonary:     Effort: Pulmonary effort is normal.  Skin:    General: Skin is warm and dry.     Capillary Refill: Capillary refill takes less than 2 seconds.  Neurological:     Mental Status: She is alert.    Right Hand Exam   Tenderness  Right hand tenderness location: TTP at dorsal and ulnar aspect of distal forearm just proximal to and at the level of the wrist.  TTP at dorsal aspect of ulnar head.  Other  Erythema: absent Sensation: normal Pulse: present  Comments:  Minimal pain over the fovea.  No pain with ulnocarpal loading.  No DRUJ instability.  She is tender palpation over the ECU tendon with a significantly positive ECU Synergy test.  There  is no evidence of tendon instability with dart thrower's/ice creams good motion.  She has a significantly positive Tinel and Phalen sign with symptoms into the index and middle fingers.     Specialty Comments:  No specialty comments available.  Imaging: No results found.   PMFS History: Patient Active Problem List   Diagnosis Date Noted   Extensor carpi ulnaris tendinitis 03/01/2021   Numbness and tingling in right hand 03/01/2021   Paraplegia ([place]) 01/12/2021   History of sleep apnea 12/04/2020   Swelling of limb 10/16/2020   Nerve pain 06/07/2020   Hyperlipidemia associated with type 2 diabetes mellitus ([place]) 03/02/2020   Atherosclerosis of aorta ([place]) 03/02/2020   Wheelchair dependence 08/04/2019   Thoracic spondylosis with myelopathy 08/04/2019   Bilateral carpal tunnel syndrome 07/06/2019   Family history of heart disease 12/04/2018   At risk for heart disease 12/04/2018   Abdominal pain, chronic, left lower quadrant 08/25/2018   Personal history of noncompliance with medical treatment, presenting hazards to health 01/29/2017   Low serum vitamin B12 11/15/2016   CKD (chronic kidney disease) stage 3, GFR 30-59 ml/min (HCC) 10/24/2016   Fatty liver disease, nonalcoholic 0000000   Hyperlipemia    Estrogen deficiency 10/25/2015   Thoracic spinal stenosis 07/06/2015   UTI (lower urinary tract infection) 07/04/2015   Poorly controlled type 2 diabetes mellitus with peripheral neuropathy ([place]) 07/04/2015   Essential hypertension 07/04/2015   Morbid obesity ([place]) 07/04/2015   Inability to walk 07/04/2015   Weakness of left leg 07/04/2015   Elevated LFTs 07/04/2015   Past Medical History:  Diagnosis Date   Elevated LFTs    Fatty liver    on [place].    Hyperlipemia    Hypertension    Inguinal hernia    2020 CT   Stage 3b chronic kidney disease ([place])    Thoracic spondylosis with myelopathy    Umbilical hernia    [id number] CT   Uncontrolled type 2 diabetes with neuropathy     Wheelchair bound    paraplegia of Leg - Left    Family History  Problem Relation Age of Onset   Diabetes Mother    Heart disease Mother    Pancreatic cancer Sister    Kidney disease Brother        Two brothers on ESRD   Amblyopia Neg Hx    Blindness Neg Hx    Cataracts Neg Hx    Glaucoma Neg Hx    Macular degeneration Neg Hx    Retinal detachment Neg Hx    Strabismus Neg Hx    Retinitis pigmentosa Neg Hx    Colon cancer Neg Hx    Esophageal cancer Neg Hx     Past Surgical History:  Procedure Laterality Date   APPENDECTOMY  1969   BREAST BIOPSY Right    CATARACT EXTRACTION     May 21016   LUMBAR [place] SURGERY     LUMBAR LAMINECTOMY/DECOMPRESSION MICRODISCECTOMY N/A 07/06/2015   Procedure: Thoracic ten- eleven laminectomy for spinal canal decompression;  Surgeon: Ashok Pall, MD;  Location: [facility];  Service: Orthopedics;  Laterality: N/A;   Social History   Occupational History   Not on file  Tobacco Use   Smoking status: Never   Smokeless tobacco: Never  Vaping Use   Vaping Use: Never used  Substance and Sexual Activity   Alcohol use: No   Drug use: No   Sexual activity: Not Currently

## 2021-03-02 DIAGNOSIS — Z794 Long term (current) use of insulin: Secondary | ICD-10-CM | POA: Diagnosis not present

## 2021-03-02 DIAGNOSIS — E118 Type 2 diabetes mellitus with unspecified complications: Secondary | ICD-10-CM | POA: Diagnosis not present

## 2021-03-07 ENCOUNTER — Telehealth: Payer: Self-pay | Admitting: Internal Medicine

## 2021-03-07 DIAGNOSIS — E1142 Type 2 diabetes mellitus with diabetic polyneuropathy: Secondary | ICD-10-CM

## 2021-03-07 MED ORDER — TRULICITY 1.5 MG/0.5ML ~~LOC~~ SOAJ
3.0000 mg | SUBCUTANEOUS | 2 refills | Status: DC
Start: 2021-03-07 — End: 2021-04-09

## 2021-03-07 NOTE — Telephone Encounter (Signed)
Rx sent to preferred pharmacy.

## 2021-03-07 NOTE — Telephone Encounter (Signed)
MEDICATION: Dulaglutide (TRULICITY) 3 MG/0.5ML SOPN  PHARMACY:   [id number] - [place], [place] - [address] AT [facility] OF [address] Phone:  [phone]  Fax:  [phone]      HAS THE PATIENT CONTACTED THEIR PHARMACY?  yes  IS THIS A 90 DAY SUPPLY : yes  IS PATIENT OUT OF MEDICATION: 1 left  IF NOT; HOW MUCH IS LEFT:   LAST APPOINTMENT DATE: @12 /14/2022  NEXT APPOINTMENT DATE:@2 /13/2023  DO WE HAVE YOUR PERMISSION TO LEAVE A DETAILED MESSAGE?:  OTHER COMMENTS: Pharmacy is unable to provide 3MG  Trulicity due to supply issue. Patient requesting new prescription for 1.5MG  of Trulicity which is in stock at pharmacy with correct dosage to be taken.   **Let patient know to contact pharmacy at the end of the day to make sure medication is ready. **  ** Please notify patient to allow 48-72 hours to process**  **Encourage patient to contact the pharmacy for refills or they can request refills through [facility]**

## 2021-03-08 ENCOUNTER — Other Ambulatory Visit: Payer: Self-pay

## 2021-03-08 ENCOUNTER — Ambulatory Visit (INDEPENDENT_AMBULATORY_CARE_PROVIDER_SITE_OTHER): Payer: Medicare Other | Admitting: Medical

## 2021-03-08 VITALS — BP 122/70 | HR 82

## 2021-03-08 DIAGNOSIS — Z8669 Personal history of other diseases of the nervous system and sense organs: Secondary | ICD-10-CM | POA: Diagnosis not present

## 2021-03-08 DIAGNOSIS — M25531 Pain in right wrist: Secondary | ICD-10-CM | POA: Diagnosis not present

## 2021-03-08 DIAGNOSIS — R002 Palpitations: Secondary | ICD-10-CM | POA: Diagnosis not present

## 2021-03-08 DIAGNOSIS — G479 Sleep disorder, unspecified: Secondary | ICD-10-CM | POA: Diagnosis not present

## 2021-03-08 DIAGNOSIS — G8929 Other chronic pain: Secondary | ICD-10-CM

## 2021-03-08 DIAGNOSIS — R0683 Snoring: Secondary | ICD-10-CM | POA: Diagnosis not present

## 2021-03-08 HISTORY — DX: Palpitations: R00.2

## 2021-03-08 NOTE — Progress Notes (Signed)
Subjective:  Cassandra Stewart is a 71 y.o. female who presents for Chief Complaint  Patient presents with   concerns    Concerns- a couple days ago- feel sleep in the wheelchair and when she woke up was having some chest flutters- knows she has sleep apnea so shes not sure if that had anything to do with it. Does not have a cpap machine. Thinks she might have bursitis in left shoulder. Not sleeping well at night- maybe 4-5 hours of sleep. Constantly tired     This is my first visit with her.  She was formally seeing Harland Dingwall nurse practitioner here before she left our practice.  She has a medical history significant for diabetes, hypertension, CKD, fatty liver disease, hyperlipidemia, history of carpal tunnel syndrome bilaterally, atherosclerosis of aorta, history of sleep apnea not on CPAP, paraplegia, numbness and tingling in upper and lower extremities, chronic weakness of legs, very limited ability to walk, confined to wheelchair, thoracic spinal stenosis, B12 deficiency, and chart noted history of noncompliance.  She lives alone.  She relies on a motorized wheelchair and manual wheelchair.  She takes care of all of her ADLs.  She does not have a nurse aide or caregiver.   Here for several concerns today  1-flutter She notes a history of sleep apnea.  She is not on any type of CPAP device.  She notes the other day within the past week she was asleep laying back in her wheelchair.  She abruptly woke up and felt a fluttering of her heart and some discomfort in her chest.  This happened just for seconds and then went away.  She thinks she was having a sleep apnea event.  She notes maybe a few other similar flutter events of her heart in the past month when she was asleep as well.  In general she is not having regular chest pains, she did not have any associated sweats, shortness of breath, dizziness or other symptoms.  She sees cardiology but has not talked about the palpitations with  him.  2-sleep apnea  Last sleep study may be 7 to 10 years ago or more.  At that time it was advised that she have a BiPAP but she never went back for titration.  3-right wrist and arm pain and swelling. She had a fall back in August where her bathroom rug slipped and she fell down on her knees.  She caught herself and had to push back up with her arms.  Ever since then she has had some pain and swelling in the right wrist and forearm.  She had some x-rays initially.  She actually has an appointment with orthopedics next week.  She has been using a reinforced wrist splint but that does not seem to be helping.  She feels weaker in her right wrist and hand  She has a history of diabetes and no recent concerns though.  No other aggravating or relieving factors.    No other c/o.  Past Medical History:  Diagnosis Date   Elevated LFTs    Fatty liver    on [place].    Hyperlipemia    Hypertension    Inguinal hernia    2020 CT   Stage 3b chronic kidney disease ([place])    Thoracic spondylosis with myelopathy    Umbilical hernia    1448 CT   Uncontrolled type 2 diabetes with neuropathy    Wheelchair bound    paraplegia of Leg - Left   Current  Outpatient Medications on File Prior to Visit  Medication Sig Dispense Refill   acetaminophen (TYLENOL) 650 MG CR tablet Take 650 mg by mouth every 8 (eight) hours as needed for pain.     albuterol (VENTOLIN HFA) 108 (90 Base) MCG/ACT inhaler Inhale 1-2 puffs into the lungs every 4 (four) hours as needed for wheezing or shortness of breath.     amLODipine (NORVASC) 10 MG tablet TAKE 1 TABLET(10 MG) BY MOUTH EVERY MORNING 90 tablet 0   aspirin EC 81 MG tablet Take 81 mg by mouth every evening.     Cholecalciferol (VITAMIN D PO) Take 1,000 mg by mouth daily.      Continuous Blood Gluc Receiver ([place]) DEVI by Does not apply route.     Cyanocobalamin (VITAMIN B 12 PO) Take 5,000 mg by mouth daily.     diclofenac Sodium (VOLTAREN) 1 % GEL Apply 4  g topically 4 (four) times daily. 100 g 0   dicyclomine (BENTYL) 10 MG capsule Take 1 capsule (10 mg total) by mouth every 8 (eight) hours as needed for spasms. 60 capsule 1   Dulaglutide (TRULICITY) 1.5 UR/4.2HC SOPN Inject 3 mg into the skin once a week. 4 mL 2   gabapentin (NEURONTIN) 300 MG capsule TAKE 1 CAPSULE(300 MG) BY MOUTH THREE TIMES DAILY 270 capsule 1   hydrochlorothiazide (MICROZIDE) 12.5 MG capsule TAKE 1 CAPSULE(12.5 MG) BY MOUTH DAILY 90 capsule 0   insulin lispro (HUMALOG) 100 UNIT/ML KwikPen INJECT 7-10 UNITS UNDER THE SKIN 2-3 TIMES DAILY 30 mL 3   Insulin Pen Needle (CAREFINE PEN NEEDLES) 32G X 4 MM MISC Use 4x a day 300 each 3   lovastatin (MEVACOR) 40 MG tablet TAKE 1 TABLET(40 MG) BY MOUTH AT BEDTIME 90 tablet 0   metFORMIN (GLUCOPHAGE-XR) 500 MG 24 hr tablet TAKE 1 TABLET(500 MG) BY MOUTH TWICE DAILY 180 tablet 3   nitroGLYCERIN (NITROSTAT) 0.4 MG SL tablet Place 1 tablet (0.4 mg total) under the tongue every 5 (five) minutes as needed for chest pain. 25 tablet 3   polyethylene glycol (MIRALAX / GLYCOLAX) 17 g packet Take 17 g by mouth as needed.     TOUJEO SOLOSTAR 300 UNIT/ML Solostar Pen Inject 40 Units into the skin at bedtime. 12 mL 3   traMADol (ULTRAM) 50 MG tablet Take 1 tablet (50 mg total) by mouth every 6 (six) hours as needed. 28 tablet 0   Accu-Chek FastClix Lancets MISC USE FOUR TIMES DAILY AS DIRECTED 200 each 3   ACCU-CHEK GUIDE test strip USE AS DIRECTED TO TEST BLOOD SUGAR FOUR TIMES DAILY 400 strip 1   Blood Glucose Monitoring Suppl (ACCU-CHEK AVIVA PLUS) w/Device KIT Use as advised 1 kit 1   Dulaglutide (TRULICITY) 3 WC/3.7SE SOPN Inject 3 mg into the skin once a week. (Patient not taking: Reported on 03/08/2021) 6 mL 1   No current facility-administered medications on file prior to visit.     The following portions of the patient's history were reviewed and updated as appropriate: allergies, current medications, past family history, past medical  history, past social history, past surgical history and problem list.  ROS Otherwise as in subjective above    Objective: BP 122/70    Pulse 82   General appearance: alert, no distress, well developed, well nourished, African-American female, seated in motorized wheelchair Oral mucosa somewhat dry, no major tonsillar swelling, no small airway Lungs clear Heart regular rate and rhythm, normal S1-S2, no murmurs Neck: Supple, nontender, no  mass no thyromegaly Right wrist and distal forearm with some localized swelling, tender along medial distal ulna, tender along wrist throughout, significantly decreased range of motion of right wrist compared to left.  Rest the right arm nontender, normal range of motion of elbow and shoulder Strength seems decreased with right hand mainly related to her pain and swelling of the right wrist No obvious lower extremity edema   EKG indication, palpitations, rate 71 bpm, PR 140 ms, QRS 72 ms, QTC 439 ms, axis -12 degrees, normal sinus rhythm.  Assessment: Encounter Diagnoses  Name Primary?   Sleep disturbance Yes   History of sleep apnea    Snoring    Palpitation    Wrist pain, chronic, right      Plan: I reviewed the panel of labs she had done in October 2022.  I also reviewed labs she had done in November 28, 2020 from hospital encounter.  With those labs her lipids are at goal, B12 okay, hemoglobin A1c was 7.1%, normal troponins, CBC normal, GFR 53 at that time.  Electrolytes okay.   Sleep disturbance, snoring, history of sleep apnea We discussed potential risk of uncontrolled sleep apnea.  Based on her prior reported sleep evaluation over 7 years ago she needed a BiPAP. I recommend referral to neurology/sleep specialist I recommend she continue to avoid sleeping supine Avoid sedative medications and alcohol in the evening   Palpitations EKG reviewed Her symptoms suggested this very likely was related to a sleep apnea event EKG unremarkable  today I recommend further evaluation for sleep apnea If you continue to get palpitations then I would recommend follow-up with cardiology   Wrist pain and swelling Continue to use your reinforced wrist splint nightly Consider cool pack to your arm 20 minutes on 20 minutes off twice daily Follow-up with orthopedics next week as planned as you may need repeat imaging at this point and further evaluation and treatment     Nilaya was seen today for concerns.  Diagnoses and all orders for this visit:  Sleep disturbance -     Ambulatory referral to Neurology  History of sleep apnea -     Ambulatory referral to Neurology  Snoring -     Ambulatory referral to Neurology  Palpitation  Wrist pain, chronic, right    Follow up: Follow-up with orthopedics and neurology as planned

## 2021-03-09 NOTE — Addendum Note (Signed)
Addended by: Jac Canavan on: 03/09/2021 03:26 PM   Modules accepted: Orders

## 2021-03-11 ENCOUNTER — Other Ambulatory Visit: Payer: Self-pay | Admitting: Family Medicine

## 2021-03-11 DIAGNOSIS — E782 Mixed hyperlipidemia: Secondary | ICD-10-CM

## 2021-03-13 ENCOUNTER — Encounter: Payer: Self-pay | Admitting: Occupational Therapy

## 2021-03-13 ENCOUNTER — Other Ambulatory Visit: Payer: Self-pay

## 2021-03-13 ENCOUNTER — Ambulatory Visit (INDEPENDENT_AMBULATORY_CARE_PROVIDER_SITE_OTHER): Payer: Commercial Managed Care - HMO | Admitting: Occupational Therapy

## 2021-03-13 DIAGNOSIS — R208 Other disturbances of skin sensation: Secondary | ICD-10-CM

## 2021-03-13 DIAGNOSIS — M25531 Pain in right wrist: Secondary | ICD-10-CM

## 2021-03-13 DIAGNOSIS — M6281 Muscle weakness (generalized): Secondary | ICD-10-CM | POA: Diagnosis not present

## 2021-03-13 DIAGNOSIS — R278 Other lack of coordination: Secondary | ICD-10-CM | POA: Diagnosis not present

## 2021-03-13 DIAGNOSIS — M79641 Pain in right hand: Secondary | ICD-10-CM

## 2021-03-13 NOTE — Patient Instructions (Signed)
Joint Protection (Carrying)    Avoid carrying items with weight on fingers. Solution: Use a shoulder bag or a back pack.  Joint Protection (Grip)    Avoid: grasping thin utensils for prolonged periods. Solution: Hold thick-handled tools in dagger fashion when-ever possible for performing tasks such as stirring or scrub-bing. Relax fingers every 10 minutes during activity.  Joint Protection (Lifting)    Avoid picking up heavy items with one hand. Solution: Use both hands, and slide item whenever possible.  Joint Protection (Pinch)    Avoid tight pinch, such as when holding a pen. Solution: Use a thick pen with a felt tip to reduce pressure on fingers.  Joint Protection (Ulnar Deviation)    Avoid positions that cause fingers to lean sideways toward little finger. Solution: Use devices like jar-openers to assist in activities.  Joint Protection (Weight Bearing)    Avoid leaning on knuckles. Solution: Open fingers and use pad of hand when needed. Put extra cushions or folded blanket on seats to avoid using hands for pushing up to stand.  Joint Protection (Wringing)    Avoid wringing towels by twisting. Solution: Loop towel around sink faucet as if braiding and pull gently, or let drip-dry.  Copyright  VHI. All rights reserved.

## 2021-03-13 NOTE — Therapy (Signed)
OUTPATIENT OCCUPATIONAL THERAPY ORTHO EVALUATION  Patient Name: Cassandra Stewart MRN: [id number] DOB:[birth year], 71 y.o., female Today's Date: 03/13/2021  PCP: Marcellina Millin REFERRING PROVIDER: Sherilyn Cooter, MD   OT End of Session - 03/13/21 1754     Visit Number 1    Number of Visits 12    Date for OT Re-Evaluation 04/10/21    Authorization Type [employer] HMO    OT Start Time 1100    OT Stop Time 1143    OT Time Calculation (min) 43 min    Equipment Utilized During Treatment Issued pre-fabricated wrist cokck up splint R hand    Activity Tolerance Patient tolerated treatment well    Behavior During Therapy WFL for tasks assessed/performed             Past Medical History:  Diagnosis Date   Elevated LFTs    Fatty liver    on [place].    Hyperlipemia    Hypertension    Inguinal hernia    2020 CT   Stage 3b chronic kidney disease ([place])    Thoracic spondylosis with myelopathy    Umbilical hernia    [id number] CT   Uncontrolled type 2 diabetes with neuropathy    Wheelchair bound    paraplegia of Leg - Left   Past Surgical History:  Procedure Laterality Date   APPENDECTOMY  1969   BREAST BIOPSY Right    CATARACT EXTRACTION     May 21016   LUMBAR DISC SURGERY     LUMBAR LAMINECTOMY/DECOMPRESSION MICRODISCECTOMY N/A 07/06/2015   Procedure: Thoracic ten- eleven laminectomy for spinal canal decompression;  Surgeon: Ashok Pall, MD;  Location: [facility];  Service: Orthopedics;  Laterality: N/A;   Patient Active Problem List   Diagnosis Date Noted   Wrist pain, chronic, right 03/08/2021   Palpitation 03/08/2021   Sleep disturbance 03/08/2021   Snoring 03/08/2021   Extensor carpi ulnaris tendinitis 03/01/2021   Numbness and tingling in right hand 03/01/2021   Paraplegia ([place]) 01/12/2021   History of sleep apnea 12/04/2020   Swelling of limb 10/16/2020   Nerve pain 06/07/2020   Hyperlipidemia associated with type 2 diabetes mellitus ([place]) 03/02/2020    Atherosclerosis of aorta ([place]) 03/02/2020   Wheelchair dependence 08/04/2019   Thoracic spondylosis with myelopathy 08/04/2019   Bilateral carpal tunnel syndrome 07/06/2019   Family history of heart disease 12/04/2018   At risk for heart disease 12/04/2018   Abdominal pain, chronic, left lower quadrant 08/25/2018   Personal history of noncompliance with medical treatment, presenting hazards to health 01/29/2017   Low serum vitamin B12 11/15/2016   CKD (chronic kidney disease) stage 3, GFR 30-59 ml/min (HCC) 10/24/2016   Fatty liver disease, nonalcoholic 0000000   Hyperlipemia    Estrogen deficiency 10/25/2015   Thoracic spinal stenosis 07/06/2015   UTI (lower urinary tract infection) 07/04/2015   Poorly controlled type 2 diabetes mellitus with peripheral neuropathy ([place]) 07/04/2015   Essential hypertension 07/04/2015   Morbid obesity ([place]) 07/04/2015   Inability to walk 07/04/2015   Weakness of left leg 07/04/2015   Elevated LFTs 07/04/2015    ONSET DATE:  ~1 year ago per pt report  REFERRING DIAG: Diagnosis M77.8 (ICD-10-CM) - Extensor carpi ulnaris tendinitis   THERAPY DIAG:  Muscle weakness (generalized)  Pain in right wrist  Pain in right hand  Other lack of coordination  Other disturbances of skin sensation  SUBJECTIVE:   SUBJECTIVE STATEMENT:  Pt is a 71 year old right-hand-dominant female who presents with dorsal  ulnar right wrist pain and numbness/tingling in the thumb, index, and middle fingers.  Wrist pain started in March 2022 after ground-level fall on a wet bath mat.  She was told by an urgent care that she had a fracture was placed in a cast.  This was transitioned to a splint as the cast came off.  In August she noticed worsening pain in her hand.  Most of his pain is at the dorsal and ulnar aspect of the wrist and distal forearm.  She has some pain with terminal supination but no pain with pronation.  She has no subjective DRUJ instability.  She denies any  clicking or catching or mechanical symptoms in the wrist.  She also describes decreased sensation of the thumb, index finger, middle finger and ring finger mostly at the tips, along with hand weakness. 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 She is wheelchair-bound secondary to lower extremity neuropathy.  She uses a power wheelchair most the time but occasionally uses a manual wheelchair.  She lives alone and is mod I with ADL's.       PERTINENT HISTORY:    Patient's ulnar dorsal wrist pain seems to be most consistent with ECU tendinitis.  Dr Tempie Donning discussed different treatment options including topical and oral anti-inflammatories, oral corticosteroids, corticosteroid injection, and hand therapy.  Given her chronic kidney disease, she avoids oral anti-inflammatory medications. She is also not a candidate for oral corticosteroids secondary to diabetes. Dr Tempie Donning has referred her to hand therapy to have a splint made and use different pain modalities.  Per Dr Madelynn Done notes, Regarding her numbness and tingling, she wants to treat the dorsal ulnar wrist pain first and then address the numbness.       PAIN:  Are you having pain? Yes NPRS scale: 6/10 Pain location: dorsal right wrist Pain orientation: Right  PAIN TYPE: aching Pain description: constant and aching  Aggravating factors: Functional use of right hand and wrist. Grip with resisted wrist extension Relieving factors: Rest, non-use, heat  PRECAUTIONS: None  HAND DOMINANCE: Right  PLOF: Independent and Independent with basic ADLs  PATIENT GOALS Decreased pain in right wrist and hand. Increased functional use right hand.  OBJECTIVE:   DIAGNOSTIC FINDINGS:  Right Hand Exam    Tenderness  Right hand tenderness location: TTP at dorsal and ulnar aspect of distal forearm just proximal to and at the level of the  wrist. No DRUJ instability.   Palpation:  She has No pain with ulnocarpal loading.  She is tender to palpation over the ECU. She is positive for pain with resisted wrist extension and resisted grip with wrist extension.  She is positive for Tinel's and Phalen's R wrist.   COGNITION: Overall cognitive status: Within functional limits for tasks assessed  ADLs: Overall ADLs: Pt reports difficulty cleaning herself after using the bathroom. Has difficulty holding a knife or pen in her hand. Difficulty using the remote control. Often drops items. Has a h/o bilateral CTS per pt report.  SENSATION: Light touch: Impaired:   Semmes Weinstein Monofilament scale: Diminished light touch (3.22 - 3.61). 3.61 digits 1-3, 2.83 or WNL's digits 4 and 5 right, but with c/o pain with in RF/Sm with functional use.  COORDINATION: Comments: Impaired, especially for ADL's  EDEMA: Dorsal wrist edema observed and palpated as compared to L dorsal wrist  HAND A/PROM:  Able to oppose to digits 2-5 right hand. Difficulty making a fist right hand.  Grip strength:  Comments: NT secondary to pain  TODAY'S TREATMENT:  After discussion in the clinic today and plan to make a custom splint for right wrist, pt stated that she "won't be able to wear that" and successfully use her power wheelchair. Pt was then issued/fitted with a prefabricated wrist cock-up splint size medium. She was able to use her power wheelchair in the clinic. She was instructed in splinting use, care and precautions. She will remove it at any time if she notices increased symptoms or cannot perform ADL's. She was then instructed in general joint protection techniques and maintaining neutral wrist during ADL's and homemaking tasks. A handout was provided and reviewed with the patient. Manual therapy was then performed on her dorsal wrist/ECU area to assist with decreased pain, increased ROM and edema control x10 min   PATIENT EDUCATION: Education details:  Joint protection, neutral wrist, splint use, care and precautions. Person educated: Patient Education method: Explanation, Demonstration, Tactile cues, Verbal cues, and Handouts Education comprehension: verbalized understanding, returned demonstration, and needs further education   ASSESSMENT:  CLINICAL IMPRESSION: Patient is a 71 y.o. R HD female with an extensive PMH whom was seen today for occupational therapy evaluation and treatment for R ECU tendonitis. Patient has performance deficits in functional skills including ADLs, coordination, dexterity, sensation, edema, ROM, strength, and pain. These impairments are limiting patient from ADLs and leisure. Patient may have co-morbidities  that affects occupational performance. Patient will benefit from skilled OT to address above impairments and improve overall function.  MODIFICATION OR ASSISTANCE TO COMPLETE EVALUATION: No modification of tasks or assist necessary to complete an evaluation.  OT OCCUPATIONAL PROFILE AND HISTORY: Problem focused assessment: Including review of records relating to presenting problem.  CLINICAL DECISION MAKING: LOW - limited treatment options, no task modification necessary  REHAB POTENTIAL: Fair  Due to duration of symptoms, lives alone   EVALUATION COMPLEXITY: Low     GOALS: Goals reviewed with patient? No  SHORT TERM GOALS: (STG required if POC>30 days)  STG Name Target Date Goal status  1 Pt wil lbe Mod I initial home program R UE Baseline:  04/10/2021 INITIAL  2 Pt will be mod I edema control techniques R dorsal wrist and hand as seen in clinic Baseline:  04/10/2021 INITIAL  3 Pt will be Mod I neutral wrist for basic ADL's  Baseline:  04/10/2021 INITIAL                       LONG TERM GOALS:   LTG Name Target Date Goal status  1 Pt will be Mod I updated HEP R UE Baseline:  05/08/2021 INITIAL  2 Pt will be Mod I joint protection principles R UE for ADL tasks Baseline:  05/08/2021 INITIAL   3 Pt will report decreased pain in R dorsal wrist as seen by pain rating of 3/10 or less with light ADL's Baseline:  05/08/2021 INITIAL  4 Pt will be Mod I possible a/e options to assist with independence with ADL's Baseline:  05/08/2021 INITIAL   PLAN: OT FREQUENCY: 1-2x/week  OT DURATION: 8 weeks  PLANNED INTERVENTIONS: self care/ADL training, therapeutic exercise, therapeutic activity, manual therapy, splinting, and ultrasound  PLAN FOR NEXT SESSION: Review joint protection principles, neutral wrist, prefab splint check and adjustments. Consider pulsed [place] R dorsal wrist/ECU  CONSULTED AND AGREED WITH PLAN OF CARE: Patient   Josephine Igo  Dixon, OT/L 03/13/2021, 6:31 PM

## 2021-03-19 ENCOUNTER — Telehealth: Payer: Self-pay | Admitting: Pharmacy Technician

## 2021-03-19 ENCOUNTER — Other Ambulatory Visit (HOSPITAL_COMMUNITY): Payer: Self-pay

## 2021-03-19 NOTE — Telephone Encounter (Signed)
Patient Advocate Encounter   Received notification from [facility] that prior authorization for Trulicity 1.5mg  pens for 3mg  dose is required by his/her insurance [employer].  Ins will only allow up to 30 days at a time. Ins customer rep said they will not approve a 84 day supply of the double dose.

## 2021-03-20 ENCOUNTER — Encounter: Payer: Commercial Managed Care - HMO | Admitting: Occupational Therapy

## 2021-03-22 ENCOUNTER — Other Ambulatory Visit (HOSPITAL_COMMUNITY): Payer: Self-pay

## 2021-03-23 NOTE — Telephone Encounter (Signed)
Noted  

## 2021-03-27 ENCOUNTER — Ambulatory Visit (INDEPENDENT_AMBULATORY_CARE_PROVIDER_SITE_OTHER): Payer: Medicare Other | Admitting: Occupational Therapy

## 2021-03-27 ENCOUNTER — Encounter: Payer: Self-pay | Admitting: Occupational Therapy

## 2021-03-27 ENCOUNTER — Other Ambulatory Visit: Payer: Self-pay

## 2021-03-27 DIAGNOSIS — M79641 Pain in right hand: Secondary | ICD-10-CM | POA: Diagnosis not present

## 2021-03-27 DIAGNOSIS — M6281 Muscle weakness (generalized): Secondary | ICD-10-CM | POA: Diagnosis not present

## 2021-03-27 DIAGNOSIS — R278 Other lack of coordination: Secondary | ICD-10-CM | POA: Diagnosis not present

## 2021-03-27 DIAGNOSIS — M25531 Pain in right wrist: Secondary | ICD-10-CM

## 2021-03-27 DIAGNOSIS — R208 Other disturbances of skin sensation: Secondary | ICD-10-CM | POA: Diagnosis not present

## 2021-03-27 NOTE — Therapy (Signed)
OUTPATIENT OCCUPATIONAL THERAPY TREATMENT NOTE   Patient Name: Cassandra Stewart MRN: [id number] DOB:06-20-1950, 71 y.o., female Today's Date: 03/27/2021  PCP: Marcellina Millin REFERRING PROVIDER: Sherilyn Cooter, MD    Past Medical History:  Diagnosis Date   Elevated LFTs    Fatty liver    on [place].    Hyperlipemia    Hypertension    Inguinal hernia    2020 CT   Stage 3b chronic kidney disease ([place])    Thoracic spondylosis with myelopathy    Umbilical hernia    [id number] CT   Uncontrolled type 2 diabetes with neuropathy    Wheelchair bound    paraplegia of Leg - Left   Past Surgical History:  Procedure Laterality Date   APPENDECTOMY  1969   BREAST BIOPSY Right    CATARACT EXTRACTION     May 21016   LUMBAR DISC SURGERY     LUMBAR LAMINECTOMY/DECOMPRESSION MICRODISCECTOMY N/A 07/06/2015   Procedure: Thoracic ten- eleven laminectomy for spinal canal decompression;  Surgeon: Ashok Pall, MD;  Location: [facility];  Service: Orthopedics;  Laterality: N/A;   Patient Active Problem List   Diagnosis Date Noted   Wrist pain, chronic, right 03/08/2021   Palpitation 03/08/2021   Sleep disturbance 03/08/2021   Snoring 03/08/2021   Extensor carpi ulnaris tendinitis 03/01/2021   Numbness and tingling in right hand 03/01/2021   Paraplegia ([place]) 01/12/2021   History of sleep apnea 12/04/2020   Swelling of limb 10/16/2020   Nerve pain 06/07/2020   Hyperlipidemia associated with type 2 diabetes mellitus ([place]) 03/02/2020   Atherosclerosis of aorta ([place]) 03/02/2020   Wheelchair dependence 08/04/2019   Thoracic spondylosis with myelopathy 08/04/2019   Bilateral carpal tunnel syndrome 07/06/2019   Family history of heart disease 12/04/2018   At risk for heart disease 12/04/2018   Abdominal pain, chronic, left lower quadrant 08/25/2018   Personal history of noncompliance with medical treatment, presenting hazards to health 01/29/2017   Low serum vitamin B12 11/15/2016   CKD  (chronic kidney disease) stage 3, GFR 30-59 ml/min (HCC) 10/24/2016   Fatty liver disease, nonalcoholic 0000000   Hyperlipemia    Estrogen deficiency 10/25/2015   Thoracic spinal stenosis 07/06/2015   UTI (lower urinary tract infection) 07/04/2015   Poorly controlled type 2 diabetes mellitus with peripheral neuropathy ([place]) 07/04/2015   Essential hypertension 07/04/2015   Morbid obesity ([place]) 07/04/2015   Inability to walk 07/04/2015   Weakness of left leg 07/04/2015   Elevated LFTs 07/04/2015    ONSET DATE: ~1 year ago  REFERRING DIAG: ECU tendonitis R   THERAPY DIAG:  Muscle weakness (generalized)  Pain in right wrist  Pain in right hand  Other lack of coordination  Other disturbances of skin sensation   PERTINENT HISTORY:  Patient's ulnar dorsal wrist pain seems to be most consistent with ECU tendinitis.  Dr Tempie Donning discussed different treatment options including topical and oral anti-inflammatories, oral corticosteroids, corticosteroid injection, and hand therapy.  Given her chronic kidney disease, she avoids oral anti-inflammatory medications. She is also not a candidate for oral corticosteroids secondary to diabetes. Dr Tempie Donning has referred her to hand therapy to have a splint made and use different pain modalities. Per Dr Madelynn Done notes, Regarding her numbness and tingling, she wants to treat the dorsal ulnar wrist pain first and then address the numbness secondary to CTS.  PRECAUTIONS: none  SUBJECTIVE: Pt reports that she is wearing her pre-fab wrist splint "most of the time". She removes it to bathe,  wash her hands etc. She rates her pain as 6/10/ today in ulnar sided hand and dorsal wrist right.   PAIN:  Are you having pain? Yes NPRS scale: 6/10 Pain location: Dorsal wrist and ulnar hand Pain orientation: Right  PAIN TYPE: aching Pain description: intermittent  Aggravating factors: Functional use of right dominant hand. Using her manual wheelchair (She  uses power w/c in public settings and manual at home Relieving factors: Rest, non-use R hand/wrist  OBJECTIVE:   TODAY'S TREATMENT: 03/27/21:  Reviewed splint use, care and precautions with pt in clinic today. Discussed that since she uses her manual w/c at home, she is performing wrist extension and UD repetitively which may be increasing her symproms. She is unable to use her power w/c at home due to narrow halls and doorways. Discussed that she should keep track of her symptoms and that if she notices increased pain in her right wrist when using both her splint and manual w/c, she may want to try removing her splint and see how she does maintaining a neutral wrist as it appears that she could be fighting against her splint. She verbalized understanding of this.   Discussed ADL's and joint protection techniques and maintaining a neutral wrist during ADL's and homemaking tasks, also discussed & performed a home program for extensor stretches to her right wrist/hand and stretch to ECU. Pulsed [place] x8 min right dorsal wrist, hand and forearm extensors at 0.8 w/cm2 with 3 Mhz to assist with edema and pain.  Manual therapy to dorsal wrist and hand, extensors for passive stretch and edema control techniques. Pt instructed to perform at home as able as able. She verbalized understanding in the clinic today.  03/13/21 TREATMENT:  After discussion in the clinic today and plan to make a custom splint for right wrist, pt stated that she "won't be able to wear that" and successfully use her power wheelchair. Pt was then issued/fitted with a prefabricated wrist cock-up splint size medium. She was able to use her power wheelchair in the clinic. She was instructed in splinting use, care and precautions. She will remove it at any time if she notices increased symptoms or cannot perform ADL's. She was then instructed in general joint protection techniques and maintaining neutral wrist during ADL's and homemaking tasks. A  handout was provided and reviewed with the patient. Manual therapy was then performed on her dorsal wrist/ECU area to assist with decreased pain, increased ROM and edema control x10 min     PATIENT EDUCATION: Education details: Joint protection, neutral wrist, splint use, care and precautions. Person educated: Patient Education method: Explanation, Demonstration, Tactile cues, Verbal cues, and Handouts Education comprehension: verbalized understanding, returned demonstration, and needs further education     ASSESSMENT:   CLINICAL IMPRESSION: Patient appears to have increased symptoms with manual w/c use at home in conjunction with wearing her splint. She may benefit from removing her splint at times to maneuver her manual w/c and perform certain ADL's - pt will focus on joint protection principles and maintaining neutral wrist so as not to aggravate ECU tendonitis symptoms. Upgraded HEP for stretch to ECU, extensors, massage. Cont toward POC and conservative treatment protocol.    MODIFICATION OR ASSISTANCE TO COMPLETE EVALUATION: No modification of tasks or assist necessary to complete an evaluation.   OT OCCUPATIONAL PROFILE AND HISTORY: Problem focused assessment: Including review of records relating to presenting problem.   CLINICAL DECISION MAKING: LOW - limited treatment options, no task modification necessary   REHAB  POTENTIAL: Fair  Due to duration of symptoms, lives alone    EVALUATION COMPLEXITY: Low       GOALS: Goals reviewed with patient? No   SHORT TERM GOALS: (STG required if POC>30 days)   STG Name Target Date Goal status  1 Pt wil lbe Mod I initial home program R UE Baseline:  04/10/2021 INITIAL  2 Pt will be mod I edema control techniques R dorsal wrist and hand as seen in clinic Baseline:  04/10/2021 INITIAL  3 Pt will be Mod I neutral wrist for basic ADL's  Baseline:  04/10/2021 INITIAL                                        LONG TERM GOALS:    LTG Name  Target Date Goal status  1 Pt will be Mod I updated HEP R UE Baseline:  05/08/2021 INITIAL  2 Pt will be Mod I joint protection principles R UE for ADL tasks Baseline:  05/08/2021 INITIAL  3 Pt will report decreased pain in R dorsal wrist as seen by pain rating of 3/10 or less with light ADL's Baseline:  05/08/2021 INITIAL  4 Pt will be Mod I possible a/e options to assist with independence with ADL's Baseline:  05/08/2021 INITIAL    PLAN: OT FREQUENCY: 1-2x/week   OT DURATION: 8 weeks   PLANNED INTERVENTIONS: self care/ADL training, therapeutic exercise, therapeutic activity, manual therapy, splinting, and ultrasound   PLAN FOR NEXT SESSION: Pulsed [place] R dorsal wrist/ECU. Review joint protection principles, neutral wrist, prefab splint check and upgrade home program as able for ECU tendonitis.    CONSULTED AND AGREED WITH PLAN OF CARE: Patient   Almyra Deforest, OT 03/27/2021, 11:52 AM

## 2021-04-01 DIAGNOSIS — E118 Type 2 diabetes mellitus with unspecified complications: Secondary | ICD-10-CM | POA: Diagnosis not present

## 2021-04-01 DIAGNOSIS — Z794 Long term (current) use of insulin: Secondary | ICD-10-CM | POA: Diagnosis not present

## 2021-04-03 ENCOUNTER — Encounter: Payer: Self-pay | Admitting: Rehabilitative and Restorative Service Providers"

## 2021-04-03 ENCOUNTER — Ambulatory Visit (INDEPENDENT_AMBULATORY_CARE_PROVIDER_SITE_OTHER): Payer: Medicare Other | Admitting: Rehabilitative and Restorative Service Providers"

## 2021-04-03 ENCOUNTER — Other Ambulatory Visit: Payer: Self-pay

## 2021-04-03 DIAGNOSIS — M79641 Pain in right hand: Secondary | ICD-10-CM | POA: Diagnosis not present

## 2021-04-03 DIAGNOSIS — M6281 Muscle weakness (generalized): Secondary | ICD-10-CM | POA: Diagnosis not present

## 2021-04-03 DIAGNOSIS — R208 Other disturbances of skin sensation: Secondary | ICD-10-CM | POA: Diagnosis not present

## 2021-04-03 DIAGNOSIS — M25531 Pain in right wrist: Secondary | ICD-10-CM | POA: Diagnosis not present

## 2021-04-03 DIAGNOSIS — R278 Other lack of coordination: Secondary | ICD-10-CM

## 2021-04-03 NOTE — Therapy (Signed)
OUTPATIENT OCCUPATIONAL THERAPY TREATMENT NOTE   Patient Name: Kerie Hockley MRN: [id number] DOB:Feb 17, 1951, 71 y.o., female Today's Date: 04/03/2021  PCP: Marcellina Millin REFERRING PROVIDER: Courtney Heys, MD   OT End of Session - 04/03/21 1108     Visit Number 3    Number of Visits 12    Date for OT Re-Evaluation 04/10/21    Authorization Type [employer] HMO    Progress Note Due on Visit 10    OT Start Time 1059    OT Stop Time 1141    OT Time Calculation (min) 42 min    Equipment Utilized During Treatment issued pre-fab wrist brace Lavon Paganini)    Activity Tolerance Patient tolerated treatment well;Patient limited by lethargy    Behavior During Therapy [facility] for tasks assessed/performed   she was lethergic, eyes closing at times            Past Medical History:  Diagnosis Date   Elevated LFTs    Fatty liver    on [place].    Hyperlipemia    Hypertension    Inguinal hernia    2020 CT   Stage 3b chronic kidney disease ([place])    Thoracic spondylosis with myelopathy    Umbilical hernia    [id number] CT   Uncontrolled type 2 diabetes with neuropathy    Wheelchair bound    paraplegia of Leg - Left   Past Surgical History:  Procedure Laterality Date   APPENDECTOMY  1969   BREAST BIOPSY Right    CATARACT EXTRACTION     May 21016   LUMBAR DISC SURGERY     LUMBAR LAMINECTOMY/DECOMPRESSION MICRODISCECTOMY N/A 07/06/2015   Procedure: Thoracic ten- eleven laminectomy for spinal canal decompression;  Surgeon: Ashok Pall, MD;  Location: [facility];  Service: Orthopedics;  Laterality: N/A;   Patient Active Problem List   Diagnosis Date Noted   Wrist pain, chronic, right 03/08/2021   Palpitation 03/08/2021   Sleep disturbance 03/08/2021   Snoring 03/08/2021   Extensor carpi ulnaris tendinitis 03/01/2021   Numbness and tingling in right hand 03/01/2021   Paraplegia ([place]) 01/12/2021   History of sleep apnea 12/04/2020   Swelling of limb 10/16/2020   Nerve pain 06/07/2020    Hyperlipidemia associated with type 2 diabetes mellitus ([place]) 03/02/2020   Atherosclerosis of aorta ([place]) 03/02/2020   Wheelchair dependence 08/04/2019   Thoracic spondylosis with myelopathy 08/04/2019   Bilateral carpal tunnel syndrome 07/06/2019   Family history of heart disease 12/04/2018   At risk for heart disease 12/04/2018   Abdominal pain, chronic, left lower quadrant 08/25/2018   Personal history of noncompliance with medical treatment, presenting hazards to health 01/29/2017   Low serum vitamin B12 11/15/2016   CKD (chronic kidney disease) stage 3, GFR 30-59 ml/min ([facility]CC) 10/24/2016   Fatty liver disease, nonalcoholic 0000000   Hyperlipemia    Estrogen deficiency 10/25/2015   Thoracic spinal stenosis 07/06/2015   UTI (lower urinary tract infection) 07/04/2015   Poorly controlled type 2 diabetes mellitus with peripheral neuropathy ([place]) 07/04/2015   Essential hypertension 07/04/2015   Morbid obesity ([place]) 07/04/2015   Inability to walk 07/04/2015   Weakness of left leg 07/04/2015   Elevated LFTs 07/04/2015    ONSET DATE: ~1 year ago  REFERRING DIAG: ECU tendonitis R   THERAPY DIAG:  Pain in right wrist  Muscle weakness (generalized)  Other lack of coordination  Pain in right hand  Other disturbances of skin sensation   PERTINENT HISTORY:  Patient's ulnar dorsal  wrist pain seems to be most consistent with ECU tendinitis.  Dr Tempie Donning discussed different treatment options including topical and oral anti-inflammatories, oral corticosteroids, corticosteroid injection, and hand therapy.  Given her chronic kidney disease, she avoids oral anti-inflammatory medications. She is also not a candidate for oral corticosteroids secondary to diabetes. Dr Tempie Donning has referred her to hand therapy to have a splint made and use different pain modalities. Per Dr Madelynn Done notes, Regarding her numbness and tingling, she wants to treat the dorsal ulnar wrist pain first and then  address the numbness secondary to CTS.  PRECAUTIONS: none  SUBJECTIVE:  04/03/21: Pt states her pre-fab wrist cock-up is only helping "a little" and c/o increased tingling in median nerve dist in Rt hand recently. She states worse at night, back sleeper. Has been using power w/c as opposedto manual w/c for past 1-2 weeks and pain in wrist is better.    03/27/21: Pt reports that she is wearing her pre-fab wrist splint "most of the time". She removes it to bathe, wash her hands etc. She rates her pain as 6/10/ today in ulnar sided hand and dorsal wrist right.   PAIN:  Are you having pain? Yes NPRS scale: 6/10 Pain location: base of 5th metacarpal area  Pain orientation: Right  PAIN TYPE: aching Pain description: constant  Aggravating factors: Functional use of right dominant hand. Using her manual wheelchair (She uses power w/c in public settings and manual at home Relieving factors: Rest, heat   OBJECTIVE:   TODAY'S TREATMENT: 04/03/21: Pt is given new circumferential wrist strap/brace (pre-fab) which applies pressure over sore areas, providing support. She states it feels good/better. She is advised to wear the wrist cock-up every night to help combat tingling, though due to chronic use and chronic diabetic neuropathy, this may take a long time and never quite subside. She states understanding importance of consistency. Next she demo's current HEP well, and OT adds new HEP education and routines for: quick massage (like vibration) about her hand to help with nerve pain, massage over ECU insertion, warmup with moist heat (cool down with ice at end if necessary), intrinsic minus stretching for apparent intrinsic tightness, thumb composite flexion stretches, and very gentle wrist flexion stretch with slight supination. She tolerates all with no increase in pain, states quick rub to hand helps with nerve pain. She states she will do all of these 3/day faithfully. Recommended to continue in power w/c  but to also keep shoulders, elbows moving to prevent loss of function from more restrictive device.     03/27/21:  Reviewed splint use, care and precautions with pt in clinic today. Discussed that since she uses her manual w/c at home, she is performing wrist extension and UD repetitively which may be increasing her symproms. She is unable to use her power w/c at home due to narrow halls and doorways. Discussed that she should keep track of her symptoms and that if she notices increased pain in her right wrist when using both her splint and manual w/c, she may want to try removing her splint and see how she does maintaining a neutral wrist as it appears that she could be fighting against her splint. She verbalized understanding of this.   Discussed ADL's and joint protection techniques and maintaining a neutral wrist during ADL's and homemaking tasks, also discussed & performed a home program for extensor stretches to her right wrist/hand and stretch to ECU. Pulsed [place] x8 min right dorsal wrist, hand and forearm extensors  at 0.8 w/cm2 with 3 Mhz to assist with edema and pain.  Manual therapy to dorsal wrist and hand, extensors for passive stretch and edema control techniques. Pt instructed to perform at home as able as able. She verbalized understanding in the clinic today.     PATIENT EDUCATION: Education details: brace use, protective postures, exercises, etc.  Person educated: Patient Education method: Explanation, Demonstration, Tactile cues, Verbal cues, and Handouts Education comprehension: verbalized understanding, returned demonstration, and needs further education     ASSESSMENT:   CLINICAL IMPRESSION: Pt seemed to react favorably to treatments today, she must be consistent at home, though, due to the chronic nature and her reliance on her upper body due to w/c use and difficulty with transfers.      GOALS: Goals reviewed with patient? No   SHORT TERM GOALS: (STG required if POC>30  days)   STG Name Target Date Goal status  1 Pt wil lbe Mod I initial home program R UE Baseline:  04/10/2021 INITIAL  2 Pt will be mod I edema control techniques R dorsal wrist and hand as seen in clinic Baseline:  04/10/2021 INITIAL  3 Pt will be Mod I neutral wrist for basic ADL's  Baseline:  04/10/2021 INITIAL    LONG TERM GOALS:    LTG Name Target Date Goal status  1 Pt will be Mod I updated HEP R UE Baseline:  05/08/2021 INITIAL  2 Pt will be Mod I joint protection principles R UE for ADL tasks Baseline:  05/08/2021 INITIAL  3 Pt will report decreased pain in R dorsal wrist as seen by pain rating of 3/10 or less with light ADL's Baseline:  05/08/2021 INITIAL  4 Pt will be Mod I possible a/e options to assist with independence with ADL's Baseline:  05/08/2021 INITIAL    PLAN: OT FREQUENCY: 1-2x/week   OT DURATION: 8 weeks   PLANNED INTERVENTIONS: self care/ADL training, therapeutic exercise, therapeutic activity, manual therapy, splinting, and ultrasound   PLAN FOR NEXT SESSION: Progress note due- take measures of strength and motion, check HEP and advance to light eccentric strength when tolerated. Also consider IASTM and taping as treatments as well     CONSULTED AND AGREED WITH PLAN OF CARE: Patient   Benito Mccreedy, OTR/L, CHT 04/03/2021, 12:15 PM

## 2021-04-09 ENCOUNTER — Other Ambulatory Visit: Payer: Self-pay

## 2021-04-09 ENCOUNTER — Encounter: Payer: Self-pay | Admitting: Internal Medicine

## 2021-04-09 ENCOUNTER — Ambulatory Visit (INDEPENDENT_AMBULATORY_CARE_PROVIDER_SITE_OTHER): Payer: Medicare Other | Admitting: Internal Medicine

## 2021-04-09 VITALS — BP 128/78 | HR 72 | Ht 64.0 in | Wt 216.6 lb

## 2021-04-09 DIAGNOSIS — E538 Deficiency of other specified B group vitamins: Secondary | ICD-10-CM | POA: Diagnosis not present

## 2021-04-09 DIAGNOSIS — E1142 Type 2 diabetes mellitus with diabetic polyneuropathy: Secondary | ICD-10-CM

## 2021-04-09 DIAGNOSIS — E782 Mixed hyperlipidemia: Secondary | ICD-10-CM

## 2021-04-09 DIAGNOSIS — E1165 Type 2 diabetes mellitus with hyperglycemia: Secondary | ICD-10-CM

## 2021-04-09 LAB — POCT GLYCOSYLATED HEMOGLOBIN (HGB A1C): Hemoglobin A1C: 8.5 % — AB (ref 4.0–5.6)

## 2021-04-09 MED ORDER — TRULICITY 3 MG/0.5ML ~~LOC~~ SOAJ
3.0000 mg | SUBCUTANEOUS | 3 refills | Status: DC
  Filled 2021-04-09: qty 6, 84d supply, fill #0

## 2021-04-09 NOTE — Progress Notes (Signed)
Patient ID: Cassandra Stewart, female   DOB: 07/08/1950, 71 y.o.   MRN: [id number]   This visit occurred during the SARS-CoV-2 public health emergency.  Safety protocols were in place, including screening questions prior to the visit, additional usage of staff PPE, and extensive cleaning of exam room while observing appropriate contact time as indicated for disinfecting solutions.   HPI: Afrah Burlison is a 71 y.o.-year-old female, returning for follow-up for DM2, dx in 1992, insulin-dependent since 2006, uncontrolled, with complications (CKD stage 3, PN, DR).  Last visit 4 months ago  Interim history: No increased urination, blurry vision, nausea. She has neck and back pain.  Also, her right wrist is still bothering her.  She wears a brace.   Reviewed HbA1c levels: Lab Results  Component Value Date   HGBA1C 7.1 (A) 12/06/2020   HGBA1C 8.5 (A) 08/04/2020   HGBA1C 9.9 (A) 04/06/2020   HGBA1C 7.4 (A) 12/02/2019   HGBA1C 9.3 (A) 07/29/2019   HGBA1C 9.6 (A) 03/30/2019   HGBA1C 9.8 (A) 12/28/2018   HGBA1C 10.7 (A) 07/13/2018   HGBA1C 6.8 (A) 03/16/2018   HGBA1C 6.1 (A) 11/12/2017   HGBA1C 9.0 07/11/2017   HGBA1C 9.0 04/14/2017   HGBA1C 8.7% 10/24/2016   HGBA1C 9.6% 07/25/2016   HGBA1C 10.5% 03/04/2016   HGBA1C 8.1% 10/25/2015   HGBA1C 9.7 (H) 07/04/2015  03/16/2018: HbA1c calculated from fructosamine 6.28%  Pt was on a regimen of: - Metformin 500 mg 1x a day with dinner.  She had diarrhea with a higher dose. - Toujeo 45 units in am - Humalog 18 units 2-3x a day, before meals Tried: Actos, Lantus.  Now on: - Metformin ER 500 mg 1-2x a day with meals. - Trulicity 1.5 >> 3 mg weekly  - not taken in 1 mo  - Toujeo 35 >> 28 >> 36 >> 40 units at night - Humalog 7-12 units before meals and 5 units before snack at night  At last visit she was not taking blood sugars that she did not have transmitters ([facility] did not send them).  Now taking >4 times a day with her Dexcom  CGM:   Previously:   Lowest sugar was 67 >> 49 (took insulin but forgot to eat) >> ?; she has hypoglycemia awareness in the 80s. Highest sugar was 383 >> 143 >> 296 >. ?.  Glucometer: Freestyle  Pt's meals are: - Breakfast/brunch: egg, bacon, toast >> no b'fast  - Lunch: snack mostly (no Humalog) >> Meals on Wheels - Dinner: chicken/fish + vegetables >> moved before 7 pm - Snacks: Belvita cracker if hungry  -+ Stage III CKD; last BUN/creatinine:  Lab Results  Component Value Date   BUN 20 11/28/2020   BUN 19 03/02/2020   CREATININE 1.10 (H) 11/28/2020   CREATININE 1.18 (H) 03/02/2020   Lab Results  Component Value Date   GFRAA 54 (L) 03/02/2020   GFRAA 59 (L) 08/31/2019   GFRAA 58 (L) 03/24/2019   GFRAA 59 (L) 03/12/2019   GFRAA 55 (L) 03/08/2019  Previously on lisinopril 10, but stopped due to hyperkalemia.   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 Coralyn Pear).  She has history of cataract surgery OU.  -+ Numbness and tingling  in her fingers-on Neurontin 300 mg 3x a day per PCP.  On ASA 81.  Low vitamin B12:  Reviewed B12 levels: Lab Results  Component Value Date   VITAMINB12 >1550 (H) 12/06/2020   VITAMINB12 >1526 (H) 07/29/2019   VITAMINB12 >1500 (H) 03/30/2019   VITAMINB12 >1500 (H) 12/28/2018   VITAMINB12 261 03/16/2018   VITAMINB12 300 04/14/2017   VITAMINB12 478 10/25/2015  05/01/2016: Vit B12 248.  We initially started 5000 mcg B12 daily, which was then decreased to 2500 mcg daily and, then to 1000 mcg daily >> every other day >> 2x a week - 11/2020.  She continues to be in a wheelchair as her left leg is very weak-believed to be from a herniated intervertebral disc.  She had surgery for this in 2016 but the strength did not  improve. She was in the emergency room on 10/24/2020 for R wrist pain and swelling.  She also sees Dr. Unk Lightning.  No fracture. Has a brace. Her ulnar nerve is affected by using her home wheelchair. She was in the emergency room 11/28/2020 for chest pain.  Troponins were negative.  D-dimer was still elevated.  She has an extensive FH of heart disease - women in her family. Daughter died after her heart stopped.  ROS: + See HPI Neurological: no tremors/+ numbness/+ tingling-fingers/no dizziness  I reviewed pt's medications, allergies, PMH, social hx, family hx, and changes were documented in the history of present illness. Otherwise, unchanged from my initial visit note.  Past Medical History:  Diagnosis Date   Elevated LFTs    Fatty liver    on [place].    Hyperlipemia    Hypertension    Inguinal hernia    2020 CT   Stage 3b chronic kidney disease ([place])    Thoracic spondylosis with myelopathy    Umbilical hernia    8938 CT   Uncontrolled type 2 diabetes with neuropathy    Wheelchair bound    paraplegia of Leg - Left   Past Surgical History:  Procedure Laterality Date   APPENDECTOMY  1969   BREAST BIOPSY Right    CATARACT EXTRACTION     May 21016   LUMBAR DISC SURGERY     LUMBAR LAMINECTOMY/DECOMPRESSION MICRODISCECTOMY N/A 07/06/2015   Procedure: Thoracic ten- eleven laminectomy for spinal canal decompression;  Surgeon: Ashok Pall, MD;  Location: [facility];  Service: Orthopedics;  Laterality: N/A;   Social History   Social History   Marital status: Divorced    Spouse name: N/A   Number of children: 0   Occupational History   None   Social History Main Topics   Smoking status: Never Smoker   Smokeless tobacco: Never Used   Alcohol use No   Drug use: No   Current Outpatient Medications on File Prior to Visit  Medication Sig Dispense Refill   Accu-Chek FastClix Lancets MISC USE FOUR TIMES DAILY AS DIRECTED 200 each 3   ACCU-CHEK GUIDE test strip USE AS DIRECTED TO  TEST BLOOD SUGAR FOUR TIMES DAILY 400 strip 1   acetaminophen (TYLENOL) 650 MG CR tablet Take 650 mg by mouth every 8 (eight) hours as needed for pain.     albuterol (VENTOLIN HFA) 108 (90 Base) MCG/ACT inhaler Inhale 1-2 puffs into the lungs every 4 (four) hours as needed for wheezing or shortness of breath.     amLODipine (NORVASC) 10 MG tablet TAKE 1 TABLET(10 MG) BY MOUTH EVERY MORNING 90 tablet 0   aspirin EC 81 MG tablet  Take 81 mg by mouth every evening.     Blood Glucose Monitoring Suppl (ACCU-CHEK AVIVA PLUS) w/Device KIT Use as advised 1 kit 1   Cholecalciferol (VITAMIN D PO) Take 1,000 mg by mouth daily.      Continuous Blood Gluc Receiver ([place]) DEVI by Does not apply route.     Cyanocobalamin (VITAMIN B 12 PO) Take 5,000 mg by mouth daily.     diclofenac Sodium (VOLTAREN) 1 % GEL Apply 4 g topically 4 (four) times daily. 100 g 0   dicyclomine (BENTYL) 10 MG capsule Take 1 capsule (10 mg total) by mouth every 8 (eight) hours as needed for spasms. 60 capsule 1   Dulaglutide (TRULICITY) 1.5 CL/2.7NT SOPN Inject 3 mg into the skin once a week. 4 mL 2   Dulaglutide (TRULICITY) 3 ZG/0.1VC SOPN Inject 3 mg into the skin once a week. (Patient not taking: Reported on 03/08/2021) 6 mL 1   gabapentin (NEURONTIN) 300 MG capsule TAKE 1 CAPSULE(300 MG) BY MOUTH THREE TIMES DAILY 270 capsule 1   hydrochlorothiazide (MICROZIDE) 12.5 MG capsule TAKE 1 CAPSULE(12.5 MG) BY MOUTH DAILY 90 capsule 0   insulin lispro (HUMALOG) 100 UNIT/ML KwikPen INJECT 7-10 UNITS UNDER THE SKIN 2-3 TIMES DAILY 30 mL 3   Insulin Pen Needle (CAREFINE PEN NEEDLES) 32G X 4 MM MISC Use 4x a day 300 each 3   lovastatin (MEVACOR) 40 MG tablet TAKE 1 TABLET(40 MG) BY MOUTH AT BEDTIME 90 tablet 0   metFORMIN (GLUCOPHAGE-XR) 500 MG 24 hr tablet TAKE 1 TABLET(500 MG) BY MOUTH TWICE DAILY 180 tablet 3   nitroGLYCERIN (NITROSTAT) 0.4 MG SL tablet Place 1 tablet (0.4 mg total) under the tongue every 5 (five) minutes as  needed for chest pain. 25 tablet 3   polyethylene glycol (MIRALAX / GLYCOLAX) 17 g packet Take 17 g by mouth as needed.     TOUJEO SOLOSTAR 300 UNIT/ML Solostar Pen Inject 40 Units into the skin at bedtime. 12 mL 3   traMADol (ULTRAM) 50 MG tablet Take 1 tablet (50 mg total) by mouth every 6 (six) hours as needed. 28 tablet 0   No current facility-administered medications on file prior to visit.   Allergies  Allergen Reactions   Other     PT PREFERS TO NOT HAVE ANY NARCOTIC MEDICATIONS   Chlorhexidine Rash    Burning after using CHG wipes-used for surgery   Oxycodone Hcl     Other reaction(s): Hallucination Marked hallucinations and palpitations following dose of 24m on 04/30/2015    Family History  Problem Relation Age of Onset   Diabetes Mother    Heart disease Mother    Pancreatic cancer Sister    Kidney disease Brother        Two brothers on ESRD   Amblyopia Neg Hx    Blindness Neg Hx    Cataracts Neg Hx    Glaucoma Neg Hx    Macular degeneration Neg Hx    Retinal detachment Neg Hx    Strabismus Neg Hx    Retinitis pigmentosa Neg Hx    Colon cancer Neg Hx    Esophageal cancer Neg Hx    Pt has FH of DM in M, MGM, PGM, M aunt, uncles.  PE: BP 128/78 (BP Location: Right Arm, Patient Position: Sitting, Cuff Size: Normal)    Pulse 72    Ht 5' 4" (1.626 m)    Wt 216 lb 9.6 oz (98.2 kg)    SpO2 98%  BMI 37.18 kg/m Now in wheelchair - now weighed today. Wt Readings from Last 3 Encounters:  04/09/21 216 lb 9.6 oz (98.2 kg)  11/28/20 200 lb (90.7 kg)  08/09/20 217 lb (98.4 kg)   Constitutional: overweight, in NAD, in wheelchair Eyes: PERRLA, EOMI, no exophthalmos ENT: moist mucous membranes, no thyromegaly, no cervical lymphadenopathy Cardiovascular: RRR, No MRG, + L ankle edema - pitting after fall 2017 Respiratory: CTA B Musculoskeletal: no deformities, strength intact in all 4 Skin: moist, warm, no rashes Neurological: no tremor with outstretched hands, DTR normal  in all 4 Diabetic Foot Exam - Simple   Simple Foot Form Diabetic Foot exam was performed with the following findings: Yes 04/09/2021 10:51 AM  Visual Inspection No deformities, no ulcerations, no other skin breakdown bilaterally: Yes See comments: Yes Sensation Testing Intact to touch and monofilament testing bilaterally: Yes Pulse Check Posterior Tibialis and Dorsalis pulse intact bilaterally: Yes Comments L 1st toenail dystrophic    ASSESSMENT: 1. DM2, insulin-dependent, uncontrolled, with complications - CKD stage 3 - PN - DR  - Of note, we tried to prescribe the Freestyle libre CGM for her but this was not covered by the insurance  2. Low B12  3. HL  PLAN:  1. Patient with longstanding, uncontrolled, DM2, on basal-bolus insulin regimen and also metformin and weekly GLP-1 receptor agonist, with improved control at last visit (HbA1c decreased to 7.1%), after restarting Trulicity.  At that time, she was unfortunately off the CGM since [facility] did not send her the transmitter.  She was also off Trulicity for about 3 weeks as she was not able to refill it. We did not change her regimen then. We gave her a sample Dexcom sensor and transmitter. -At this visit, she tells me that she again was off Trulicity for few weeks due to shortage at the pharmacy. CGM interpretation: -At today's visit, we reviewed her CGM downloads: It appears that 40.3% of values are in target range (goal >70%), while 59.7% are higher than 180 (goal <25%), and 0% are lower than 70 (goal <4%).  The calculated average blood sugar is 206.   -Reviewing the CGM trends, it appears that her sugars increase after her 1st meal of the day, which is around 12 PM.  Upon questioning, she is having a bagel and this meal and we discussed about the fact that they can greatly increase his blood sugars.  I advised him to try to eliminate this.  However, if sugars remain high afterwards, to increase the Humalog before breakfast, but  she may need to do this with the rest of the meals.  Since sugars after dinner increase precipitously after her snack, I advised her to increase her insulin before the snack to 7 units.  Also, we will plan to try to restart Trulicity.  We called the [facility] health pharmacy downstairs and they do have it in stock so we directed patient to this pharmacy. - I suggested to:  Patient Instructions  Please continue: - Metformin ER 500 mg 1-2x a day with meals - Toujeo 40 units at night  Restart: - Trulicity 3 mg weekly  Increase: - Humalog 12-16 units before the meals, and 7 units before the snack at night    Try to eliminate bagels.  Please continue B12 1000 mcg 2x a week.  Please return in 4 months.    - we checked her HbA1c: 8.5% (higher) - advised to check sugars at different times of the day - 4x a day,  rotating check times - advised for yearly eye exams >> she is UTD - she has peripheral neuropathy for which she is on the higher dose of Neurontin-refills per PCP. - foot exam performed today - return to clinic in 4 months  2. Low B12 -She has a history of low B12 for which we started supplementation with p.o. B12 vitamin -At last visit she was taking 1000 mcg every other day -At last visit, B12 level was too high so I advised her to take the B12 supplement only twice a week -will recheck at next OV  3. HL -R reviewed latest lipid panel from last visit: All fractions at goal: Lab Results  Component Value Date   CHOL 155 12/06/2020   HDL 68.10 12/06/2020   LDLCALC 58 12/06/2020   TRIG 145.0 12/06/2020   CHOLHDL 2 12/06/2020  - she continues on Mevacor 40 mg daily without side effects   Philemon Kingdom, MD PhD [facility] Endocrinology

## 2021-04-09 NOTE — Patient Instructions (Addendum)
Please continue: - Metformin ER 500 mg 1-2x a day with meals - Toujeo 40 units at night  Restart: - Trulicity 3 mg weekly  Increase: - Humalog 12-16 units before the meals, and 7 units before the snack at night    Try to eliminate bagels.  Please continue B12 1000 mcg 2x a week.  Please return in 4 months.

## 2021-04-10 ENCOUNTER — Ambulatory Visit (INDEPENDENT_AMBULATORY_CARE_PROVIDER_SITE_OTHER): Payer: Medicare Other | Admitting: Rehabilitative and Restorative Service Providers"

## 2021-04-10 ENCOUNTER — Encounter: Payer: Self-pay | Admitting: Rehabilitative and Restorative Service Providers"

## 2021-04-10 DIAGNOSIS — R278 Other lack of coordination: Secondary | ICD-10-CM | POA: Diagnosis not present

## 2021-04-10 DIAGNOSIS — M79641 Pain in right hand: Secondary | ICD-10-CM | POA: Diagnosis not present

## 2021-04-10 DIAGNOSIS — R208 Other disturbances of skin sensation: Secondary | ICD-10-CM

## 2021-04-10 DIAGNOSIS — M6281 Muscle weakness (generalized): Secondary | ICD-10-CM

## 2021-04-10 DIAGNOSIS — M25531 Pain in right wrist: Secondary | ICD-10-CM | POA: Diagnosis not present

## 2021-04-10 DIAGNOSIS — R293 Abnormal posture: Secondary | ICD-10-CM | POA: Diagnosis not present

## 2021-04-10 NOTE — Therapy (Signed)
OUTPATIENT OCCUPATIONAL THERAPY TREATMENT NOTE & Progress Note   Patient Name: Cassandra Stewart MRN: [id number] DOB:06-11-1950, 71 y.o., female Today's Date: 04/10/2021  PCP: Marcellina Millin REFERRING PROVIDER: Sherilyn Cooter, MD  Progress Note  Reporting Period 03/13/21 to 04/10/21  See note below for Objective Data and Assessment of Progress/Goals.       OT End of Session - 04/10/21 1145     Visit Number 4    Number of Visits 12    Date for OT Re-Evaluation 05/11/21    Authorization Type [employer] HMO    Authorization - Visit Number 4    Progress Note Due on Visit 10    OT Start Time 6384    OT Stop Time 1238    OT Time Calculation (min) 53 min    Equipment Utilized During Treatment --    Activity Tolerance Patient tolerated treatment well;Patient limited by lethargy    Behavior During Therapy [facility] for tasks assessed/performed   she was lethergic, eyes closing at times             Past Medical History:  Diagnosis Date   Elevated LFTs    Fatty liver    on [place].    Hyperlipemia    Hypertension    Inguinal hernia    2020 CT   Stage 3b chronic kidney disease ([place])    Thoracic spondylosis with myelopathy    Umbilical hernia    5364 CT   Uncontrolled type 2 diabetes with neuropathy    Wheelchair bound    paraplegia of Leg - Left   Past Surgical History:  Procedure Laterality Date   APPENDECTOMY  1969   BREAST BIOPSY Right    CATARACT EXTRACTION     May 21016   LUMBAR DISC SURGERY     LUMBAR LAMINECTOMY/DECOMPRESSION MICRODISCECTOMY N/A 07/06/2015   Procedure: Thoracic ten- eleven laminectomy for spinal canal decompression;  Surgeon: Ashok Pall, MD;  Location: [place] [facility];  Service: Orthopedics;  Laterality: N/A;   Patient Active Problem List   Diagnosis Date Noted   Wrist pain, chronic, right 03/08/2021   Palpitation 03/08/2021   Sleep disturbance 03/08/2021   Snoring 03/08/2021   Extensor carpi ulnaris tendinitis 03/01/2021   Numbness and  tingling in right hand 03/01/2021   Paraplegia ([place]) 01/12/2021   History of sleep apnea 12/04/2020   Swelling of limb 10/16/2020   Nerve pain 06/07/2020   Hyperlipidemia associated with type 2 diabetes mellitus ([place]) 03/02/2020   Atherosclerosis of aorta ([place]) 03/02/2020   Wheelchair dependence 08/04/2019   Thoracic spondylosis with myelopathy 08/04/2019   Bilateral carpal tunnel syndrome 07/06/2019   Family history of heart disease 12/04/2018   At risk for heart disease 12/04/2018   Abdominal pain, chronic, left lower quadrant 08/25/2018   Personal history of noncompliance with medical treatment, presenting hazards to health 01/29/2017   Low serum vitamin B12 11/15/2016   CKD (chronic kidney disease) stage 3, GFR 30-59 ml/min (HCC) 10/24/2016   Fatty liver disease, nonalcoholic 68/04/2120   Hyperlipemia    Estrogen deficiency 10/25/2015   Thoracic spinal stenosis 07/06/2015   UTI (lower urinary tract infection) 07/04/2015   Poorly controlled type 2 diabetes mellitus with peripheral neuropathy ([place]) 07/04/2015   Essential hypertension 07/04/2015   Morbid obesity ([place]) 07/04/2015   Inability to walk 07/04/2015   Weakness of left leg 07/04/2015   Elevated LFTs 07/04/2015    ONSET DATE: ~1 year ago  REFERRING DIAG: ECU tendonitis R   THERAPY DIAG:  Pain in right wrist  Other lack of coordination  Other disturbances of skin sensation  Pain in right hand  Muscle weakness (generalized)  Abnormal posture     PRECAUTIONS: none  SUBJECTIVE:  04/10/21: She states new wrist strap is much better and "I can do more." She is very concerned about her global Rt hand numbness at night.  04/03/21: Pt states her pre-fab wrist cock-up is only helping "a little" and c/o increased tingling in median nerve dist in Rt hand recently. She states worse at night, back sleeper. Has been using power w/c as opposedto manual w/c for past 1-2 weeks and pain in wrist is better.   03/27/21: Pt  reports that she is wearing her pre-fab wrist splint "most of the time". She removes it to bathe, wash her hands etc. She rates her pain as 6/10/ today in ulnar sided hand and dorsal wrist right.   PAIN:  Are you having pain? Yes NPRS scale: 3/10 Pain location: base of 5th metacarpal area  Pain orientation: Right  PAIN TYPE: aching Pain description: constant  Aggravating factors: Functional use of right dominant hand. Using her manual wheelchair (She uses power w/c in public settings and manual at home Relieving factors: Rest, heat   PERTINENT HISTORY:  Pt with recent ECU tendonitis, c/o increased Median N tingling in past 4 months but does have hx of diabetic neuropathy. Typically she uses manual w/c around house and power w/c in community due to weakness in feet/legs and foot troubles due to DM II neuropathy. She has a lot of chronic wear & tear to b/l UEs due to w/c use and difficult transfers. She is also very sedate, typically seated in w/c with no significant physical exercise or activity. Due to chronic kidney disease, she avoids oral anti-inflammatory medications. She is also not a candidate for oral corticosteroids secondary to diabetes.    OBJECTIVE:   TODAY'S TREATMENT: 04/10/21: She performs A/ROM, gripping, FMS activities and discusses self care and home care for reassessment and educational purposes. OT also provides updated HEP for nerve, tendon issues as well as new head/neck motions to try to better neck A/ROM as it may play a role in sleeping numbness/pain. OT discusses sleep positions in detail with pt, and she is encouraged to try supportive pillows and monitor her symptoms. OT educates again on nerve issues- compression and relief through bracing, modifications to habits, and new nerve glides. She demos back well, but needs to be very consistent with HEP.  She is also cautioned to try to not be sedate at home, especially since she has been in power w/c more often now.    HEP  Exercises Handout today:  Seated Passive Cervical Retraction - 2-3 x daily - 1 sets - 5-10 reps Seated Cervical Retraction and Extension - 2-3 x daily - 1 sets - 5-10 reps Seated Cervical Sidebending AROM - 2-3 x daily - 1 sets - 5-10 reps Seated Cervical Rotation AROM - 2-3 x daily - 1 sets - 5-10 reps Median Nerve Flossing - 2-3 x daily - 5 reps - 3 seconds hold Seated Wrist Flexion with Overpressure - 4-6 x daily - 3-5 reps - 15 sec hold Seated Wrist Prayer Stretch - 4-6 x daily - 3-5 reps - 15 sec hold Seated Multiple Digit Intrinsic Stretch - 4-6 x daily - 3-5 reps - 15 sec hold Tendon Glides - 3-4 x daily - 5- 10 reps - 2-3 seconds hold   04/03/21: Pt is given new  circumferential wrist strap/brace (pre-fab) which applies pressure over sore areas, providing support. She states it feels good/better. She is advised to wear the wrist cock-up every night to help combat tingling, though due to chronic use and chronic diabetic neuropathy, this may take a long time and never quite subside. She states understanding importance of consistency. Next she demo's current HEP well, and OT adds new HEP education and routines for: quick massage (like vibration) about her hand to help with nerve pain, massage over ECU insertion, warmup with moist heat (cool down with ice at end if necessary), intrinsic minus stretching for apparent intrinsic tightness, thumb composite flexion stretches, and very gentle wrist flexion stretch with slight supination. She tolerates all with no increase in pain, states quick rub to hand helps with nerve pain. She states she will do all of these 3/day faithfully. Recommended to continue in power w/c but to also keep shoulders, elbows moving to prevent loss of function from more restrictive device.    03/27/21:  Reviewed splint use, care and precautions with pt in clinic today. Discussed that since she uses her manual w/c at home, she is performing wrist extension and UD repetitively which  may be increasing her symproms. She is unable to use her power w/c at home due to narrow halls and doorways. Discussed that she should keep track of her symptoms and that if she notices increased pain in her right wrist when using both her splint and manual w/c, she may want to try removing her splint and see how she does maintaining a neutral wrist as it appears that she could be fighting against her splint. She verbalized understanding of this.   Discussed ADL's and joint protection techniques and maintaining a neutral wrist during ADL's and homemaking tasks, also discussed & performed a home program for extensor stretches to her right wrist/hand and stretch to ECU. Pulsed [place] x8 min right dorsal wrist, hand and forearm extensors at 0.8 w/cm2 with 3 Mhz to assist with edema and pain.  Manual therapy to dorsal wrist and hand, extensors for passive stretch and edema control techniques. Pt instructed to perform at home as able as able. She verbalized understanding in the clinic today.     PATIENT EDUCATION: Education details: brace use, protective postures, exercises, etc.  Person educated: Patient Education method: Explanation, Demonstration, Tactile cues, Verbal cues, and Handouts Education comprehension: verbalized understanding, returned demonstration, and needs further education   HEP:  Access Code: [facility] URL: [email or web address] Date: 04/10/2021 Prepared by: Benito Mccreedy    ASSESSMENT: Updated today 04/10/21   CLINICAL IMPRESSION: Pt has a multitude of issues including chronic, acute, nerves, tendonitis, poor mobility, etc. OT found significantly poor neck rotation, which may effect nerves as well- that will be addressed as well. ECU is less painful today, so rest and stretches are helping, but L non-dom hand is also tight. With so many difficulties treatment will be a long, hard road. OT recommends against sx management of median N. compression due to reliance on UEs  for transfers.   She also would likely benefit from PT for transfers, leg strength and balance, which may help stop the cascading, systemic problem of w/c overuse, sedate lifestyle, etc.   SENSATION: Semmes Weinstein Monofilament scale: Rt hand: 2.83 Ulnar Nerve Dist (WNL); 3.61 Median Nerve Dist (Diminished Protective Sensation)    COORDINATION: Comments: decreased Rt hand compared to Lt hand per 9HPT coordination testing (33sec L hand vs 37.5 sec Rt dominant hand)    EDEMA: Improving  in rt UE, negligible when compared to Lt UE now    HAND A/PROM: Still unable to make a full fist due to intrinsic tightness (positive Bunnell Test for intrinsic tightness). Working on with HEP    Hand Function/Strength:  Grip strength: 11# Rt hand painful vs 35.4# Lt hand      GOALS: Goals reviewed with patient? yes   SHORT TERM GOALS: (STG required if POC>30 days)   STG Name Target Date Goal status  1 Pt wil lbe Mod I initial home program R UE Baseline:  04/10/2021 Met 04/10/21  2 Pt will be mod I edema control techniques R dorsal wrist and hand as seen in clinic Baseline:  04/10/2021 Met 04/10/21  3 Pt will be Mod I neutral wrist for basic ADL's  Baseline:  04/10/2021 Progressing    LONG TERM GOALS:    LTG Name Target Date Goal status  1 Pt will be Mod I updated HEP R UE Baseline:  05/08/2021 Progressing   2 Pt will be Mod I joint protection principles R UE for ADL tasks Baseline:  05/08/2021 Progressing  3 Pt will report decreased pain in R dorsal wrist as seen by pain rating of 3/10 or less with light ADL's Baseline:  05/08/2021 Progressing down to 3/10 at rest now 04/10/21  4 Pt will be Mod I possible a/e options to assist with independence with ADL's Baseline:  05/08/2021 INITIAL A/E yet to be discussed, we will work on remediation first   5 Decrease Impairment per Quick DASH from 68% to 40% or better.  05/08/21 New goal 04/10/21  6 Increase b/l wrist flex A/ROM to at least 55* b/l for better  flexibility when grasping.  05/08/21 New goal 04/10/21  7 Increase rt dom grip from 11# painful to at least 25# for BADLs in the home  05/08/21 New goal 04/10/21    PLAN: OT FREQUENCY: 2x/week   OT DURATION: 4 weeks   PLANNED INTERVENTIONS: self care/ADL training, therapeutic exercise, therapeutic activity, manual therapy, splinting, and ultrasound   PLAN FOR NEXT SESSION:  Review lengthy HEP, ensure compliance with every day at least 3xday, perform modalities, give A/E as needed, advance to light eccentric strength at wrist if pain still 3/10 or better.      CONSULTED AND AGREED WITH PLAN OF CARE: Patient   Benito Mccreedy, OTR/L, CHT 04/10/2021, 1:31 PM

## 2021-04-16 ENCOUNTER — Ambulatory Visit (INDEPENDENT_AMBULATORY_CARE_PROVIDER_SITE_OTHER): Payer: Medicare Other | Admitting: Physician Assistant

## 2021-04-16 ENCOUNTER — Other Ambulatory Visit: Payer: Self-pay

## 2021-04-16 ENCOUNTER — Encounter: Payer: Self-pay | Admitting: Physician Assistant

## 2021-04-16 VITALS — BP 138/62 | HR 80 | Temp 98.7°F | Resp 20 | Ht 64.5 in | Wt 216.0 lb

## 2021-04-16 DIAGNOSIS — Z993 Dependence on wheelchair: Secondary | ICD-10-CM

## 2021-04-16 DIAGNOSIS — E1121 Type 2 diabetes mellitus with diabetic nephropathy: Secondary | ICD-10-CM

## 2021-04-16 DIAGNOSIS — G8220 Paraplegia, unspecified: Secondary | ICD-10-CM

## 2021-04-16 DIAGNOSIS — N183 Chronic kidney disease, stage 3 unspecified: Secondary | ICD-10-CM | POA: Diagnosis not present

## 2021-04-16 DIAGNOSIS — E782 Mixed hyperlipidemia: Secondary | ICD-10-CM

## 2021-04-16 DIAGNOSIS — I10 Essential (primary) hypertension: Secondary | ICD-10-CM | POA: Diagnosis not present

## 2021-04-16 DIAGNOSIS — N61 Mastitis without abscess: Secondary | ICD-10-CM

## 2021-04-16 MED ORDER — SULFAMETHOXAZOLE-TRIMETHOPRIM 800-160 MG PO TABS: 1.0000 | ORAL_TABLET | Freq: Two times a day (BID) | ORAL | 0 refills | Status: AC

## 2021-04-16 MED ORDER — LOVASTATIN 40 MG PO TABS: 40.0000 mg | ORAL_TABLET | Freq: Every day | ORAL | 3 refills | Status: DC

## 2021-04-16 MED ORDER — AMLODIPINE BESYLATE 10 MG PO TABS: 10.0000 mg | ORAL_TABLET | Freq: Every day | ORAL | 3 refills | Status: DC

## 2021-04-16 MED ORDER — HYDROCHLOROTHIAZIDE 12.5 MG PO CAPS: 12.5000 mg | ORAL_CAPSULE | Freq: Every day | ORAL | 3 refills | Status: DC

## 2021-04-16 NOTE — Progress Notes (Signed)
Acute Office Visit  Subjective:    Patient ID: Cassandra Stewart, female    DOB: Nov 01, 1950, 71 y.o.   MRN: [id number]  Chief Complaint  Patient presents with   Hypertension    Follow up on HTN. Patient is nonfasting. Patient has a sore under her left breast, she would like to know if you think it is healing-she is diabetic. Was using betadine and neosporin-didn't use today. No other new concerns.     HPI  Patient is in today for a follow up appointment.  Reports she has a blister under her left breast x 3 weeks; states it burst a week ago and was very painful, but the pain has now decreased; has been using betadine and a type of antibacterial ointment on it which seems to be helping & it seems like its healing;  denies fever / chills / nausea / vomiting / diarrhea / constipation. Denies any other issues.   Endocrinology - Philemon Kingdom, MD PhD, [facility] Endocrinology Physical Medicine and Rehabilitation - [place], Jinny Blossom, MD Nephrology - doesn't have, will refer for baseline evaluation, last serum creatinine 1.10 on 11/28/2020   Past Medical History:  Diagnosis Date   Elevated LFTs    Fatty liver    on [place].    Hyperlipemia    Hypertension    Inguinal hernia    2020 CT   Stage 3b chronic kidney disease ([place])    Thoracic spondylosis with myelopathy    Umbilical hernia    6415 CT   Uncontrolled type 2 diabetes with neuropathy    Wheelchair bound    paraplegia of Leg - Left    Past Surgical History:  Procedure Laterality Date   APPENDECTOMY  1969   BREAST BIOPSY Right    CATARACT EXTRACTION     May 21016   LUMBAR DISC SURGERY     LUMBAR LAMINECTOMY/DECOMPRESSION MICRODISCECTOMY N/A 07/06/2015   Procedure: Thoracic ten- eleven laminectomy for spinal canal decompression;  Surgeon: Ashok Pall, MD;  Location: [place] [facility];  Service: Orthopedics;  Laterality: N/A;    Family History  Problem Relation Age of Onset   Diabetes Mother    Heart disease Mother    Pancreatic  cancer Sister    Kidney disease Brother        Two brothers on ESRD   Amblyopia Neg Hx    Blindness Neg Hx    Cataracts Neg Hx    Glaucoma Neg Hx    Macular degeneration Neg Hx    Retinal detachment Neg Hx    Strabismus Neg Hx    Retinitis pigmentosa Neg Hx    Colon cancer Neg Hx    Esophageal cancer Neg Hx     Social History   Socioeconomic History   Marital status: Divorced    Spouse name: Not on file   Number of children: Not on file   Years of education: Not on file   Highest education level: Not on file  Occupational History   Not on file  Tobacco Use   Smoking status: Never   Smokeless tobacco: Never  Vaping Use   Vaping Use: Never used  Substance and Sexual Activity   Alcohol use: No   Drug use: No   Sexual activity: Not Currently  Other Topics Concern   Not on file  Social History Narrative   Not on file   Social Determinants of Health   Financial Resource Strain: Not on file  Food Insecurity: Not on file  Transportation Needs:  Not on file  Physical Activity: Not on file  Stress: Not on file  Social Connections: Not on file  Intimate Partner Violence: Not on file    Outpatient Medications Prior to Visit  Medication Sig Dispense Refill   Accu-Chek FastClix Lancets MISC USE FOUR TIMES DAILY AS DIRECTED 200 each 3   ACCU-CHEK GUIDE test strip USE AS DIRECTED TO TEST BLOOD SUGAR FOUR TIMES DAILY 400 strip 1   acetaminophen (TYLENOL) 650 MG CR tablet Take 650 mg by mouth every 8 (eight) hours as needed for pain.     aspirin EC 81 MG tablet Take 81 mg by mouth every evening.     Blood Glucose Monitoring Suppl (ACCU-CHEK AVIVA PLUS) w/Device KIT Use as advised 1 kit 1   Cholecalciferol (VITAMIN D PO) Take 1,000 mg by mouth daily.      Continuous Blood Gluc Receiver ([place]) DEVI by Does not apply route.     Cyanocobalamin (VITAMIN B 12 PO) Take 5,000 mg by mouth daily.     diclofenac Sodium (VOLTAREN) 1 % GEL Apply 4 g topically 4 (four) times  daily. 100 g 0   Dulaglutide (TRULICITY) 3 ZO/1.0RU SOPN Inject 3 mg into the skin once a week. 6 mL 3   gabapentin (NEURONTIN) 300 MG capsule TAKE 1 CAPSULE(300 MG) BY MOUTH THREE TIMES DAILY 270 capsule 1   Insulin Pen Needle (CAREFINE PEN NEEDLES) 32G X 4 MM MISC Use 4x a day 300 each 3   metFORMIN (GLUCOPHAGE-XR) 500 MG 24 hr tablet TAKE 1 TABLET(500 MG) BY MOUTH TWICE DAILY 180 tablet 3   TOUJEO SOLOSTAR 300 UNIT/ML Solostar Pen Inject 40 Units into the skin at bedtime. 12 mL 3   amLODipine (NORVASC) 10 MG tablet TAKE 1 TABLET(10 MG) BY MOUTH EVERY MORNING 90 tablet 0   hydrochlorothiazide (MICROZIDE) 12.5 MG capsule TAKE 1 CAPSULE(12.5 MG) BY MOUTH DAILY 90 capsule 0   lovastatin (MEVACOR) 40 MG tablet TAKE 1 TABLET(40 MG) BY MOUTH AT BEDTIME 90 tablet 0   albuterol (VENTOLIN HFA) 108 (90 Base) MCG/ACT inhaler Inhale 1-2 puffs into the lungs every 4 (four) hours as needed for wheezing or shortness of breath. (Patient not taking: Reported on 04/16/2021)     dicyclomine (BENTYL) 10 MG capsule Take 1 capsule (10 mg total) by mouth every 8 (eight) hours as needed for spasms. (Patient not taking: Reported on 04/16/2021) 60 capsule 1   insulin lispro (HUMALOG) 100 UNIT/ML KwikPen INJECT 7-10 UNITS UNDER THE SKIN 2-3 TIMES DAILY (Patient not taking: Reported on 04/16/2021) 30 mL 3   nitroGLYCERIN (NITROSTAT) 0.4 MG SL tablet Place 1 tablet (0.4 mg total) under the tongue every 5 (five) minutes as needed for chest pain. (Patient not taking: Reported on 04/16/2021) 25 tablet 3   polyethylene glycol (MIRALAX / GLYCOLAX) 17 g packet Take 17 g by mouth as needed. (Patient not taking: Reported on 04/16/2021)     traMADol (ULTRAM) 50 MG tablet Take 1 tablet (50 mg total) by mouth every 6 (six) hours as needed. (Patient not taking: Reported on 04/16/2021) 28 tablet 0   No facility-administered medications prior to visit.    Allergies  Allergen Reactions   Other     PT PREFERS TO NOT HAVE ANY NARCOTIC  MEDICATIONS   Chlorhexidine Rash    Burning after using CHG wipes-used for surgery   Oxycodone Hcl     Other reaction(s): Hallucination Marked hallucinations and palpitations following dose of 35m on 04/30/2015  Review of Systems  Constitutional:  Negative for activity change and chills.  HENT:  Negative for congestion and voice change.   Eyes:  Negative for pain and redness.  Respiratory:  Negative for cough and wheezing.   Cardiovascular:  Negative for chest pain.  Gastrointestinal:  Negative for constipation, diarrhea, nausea and vomiting.  Endocrine: Negative for polyuria.  Genitourinary:  Negative for frequency.  Skin:  Positive for wound. Negative for color change and rash.  Allergic/Immunologic: Negative for immunocompromised state.  Neurological:  Negative for dizziness.  Psychiatric/Behavioral:  Negative for agitation.       Objective:    Physical Exam Vitals and nursing note reviewed.  Constitutional:      General: She is not in acute distress.    Appearance: Normal appearance. She is not ill-appearing.  HENT:     Head: Normocephalic and atraumatic.     Right Ear: External ear normal.     Left Ear: External ear normal.     Nose: No congestion.  Eyes:     Extraocular Movements: Extraocular movements intact.     Conjunctiva/sclera: Conjunctivae normal.     Pupils: Pupils are equal, round, and reactive to light.  Cardiovascular:     Rate and Rhythm: Normal rate and regular rhythm.     Pulses: Normal pulses.     Heart sounds: Normal heart sounds.  Pulmonary:     Effort: Pulmonary effort is normal.     Breath sounds: Normal breath sounds. No wheezing.  Abdominal:     General: Bowel sounds are normal.     Palpations: Abdomen is soft.  Musculoskeletal:     Cervical back: Normal range of motion and neck supple.     Right lower leg: No edema.     Left lower leg: No edema.  Skin:    General: Skin is warm and dry.     Findings: No bruising.  Neurological:      General: No focal deficit present.     Mental Status: She is alert and oriented to person, place, and time.  Psychiatric:        Mood and Affect: Mood normal.        Behavior: Behavior normal.        Thought Content: Thought content normal.    BP 138/62    Pulse 80    Temp 98.7 F (37.1 C) (Tympanic)    Resp 20    Ht 5' 4.5" (1.638 m)    Wt 216 lb (98 kg)    BMI 36.50 kg/m   Wt Readings from Last 3 Encounters:  04/16/21 216 lb (98 kg)  04/09/21 216 lb 9.6 oz (98.2 kg)  11/28/20 200 lb (90.7 kg)    Health Maintenance Due  Topic Date Due   COLONOSCOPY (Pts 45-92yr Insurance coverage will need to be confirmed)  Never done   MAMMOGRAM  07/16/2020    There are no preventive care reminders to display for this patient.   Lab Results  Component Value Date   TSH 1.630 08/31/2019   Lab Results  Component Value Date   WBC 7.3 11/28/2020   HGB 12.8 11/28/2020   HCT 39.7 11/28/2020   MCV 90.0 11/28/2020   PLT 308 11/28/2020   Lab Results  Component Value Date   NA 140 11/28/2020   K 4.3 11/28/2020   CO2 27 11/28/2020   GLUCOSE 115 (H) 11/28/2020   BUN 20 11/28/2020   CREATININE 1.10 (H) 11/28/2020   BILITOT 0.3 03/02/2020  ALKPHOS 87 03/02/2020   AST 34 03/02/2020   ALT 34 (H) 03/02/2020   PROT 6.7 03/02/2020   ALBUMIN 4.1 03/02/2020   CALCIUM 9.4 11/28/2020   ANIONGAP 10 11/28/2020   GFR 61.35 09/18/2018   Lab Results  Component Value Date   CHOL 155 12/06/2020   Lab Results  Component Value Date   HDL 68.10 12/06/2020   Lab Results  Component Value Date   LDLCALC 58 12/06/2020   Lab Results  Component Value Date   TRIG 145.0 12/06/2020   Lab Results  Component Value Date   CHOLHDL 2 12/06/2020   Lab Results  Component Value Date   HGBA1C 8.5 (A) 04/09/2021       Assessment & Plan:   Problem List Items Addressed This Visit       Cardiovascular and Mediastinum   Essential hypertension - Primary   Relevant Medications   amLODipine  (NORVASC) 10 MG tablet   hydrochlorothiazide (MICROZIDE) 12.5 MG capsule   lovastatin (MEVACOR) 40 MG tablet     Endocrine   Type 2 diabetes mellitus with diabetic nephropathy (HCC)   Relevant Medications   lovastatin (MEVACOR) 40 MG tablet     Nervous and Auditory   Paraplegia (HCC)     Genitourinary   Stage 3 chronic kidney disease ([place])   Relevant Orders   Ambulatory referral to Nephrology     Other   Hyperlipemia   Relevant Medications   amLODipine (NORVASC) 10 MG tablet   hydrochlorothiazide (MICROZIDE) 12.5 MG capsule   lovastatin (MEVACOR) 40 MG tablet   Wheelchair dependence   Other Visit Diagnoses     Cellulitis of left breast       Relevant Medications   sulfamethoxazole-trimethoprim (BACTRIM DS) 800-160 MG tablet        Meds ordered this encounter  Medications   amLODipine (NORVASC) 10 MG tablet    Sig: Take 1 tablet (10 mg total) by mouth daily.    Dispense:  90 tablet    Refill:  3    Order Specific Question:   Supervising Provider    Answer:   Denita Lung [[id number]]   hydrochlorothiazide (MICROZIDE) 12.5 MG capsule    Sig: Take 1 capsule (12.5 mg total) by mouth daily.    Dispense:  90 capsule    Refill:  3    Order Specific Question:   Supervising Provider    Answer:   Denita Lung [[id number]]   lovastatin (MEVACOR) 40 MG tablet    Sig: Take 1 tablet (40 mg total) by mouth at bedtime.    Dispense:  90 tablet    Refill:  3    Order Specific Question:   Supervising Provider    Answer:   Denita Lung [[id number]]   sulfamethoxazole-trimethoprim (BACTRIM DS) 800-160 MG tablet    Sig: Take 1 tablet by mouth 2 (two) times daily for 10 days.    Dispense:  20 tablet    Refill:  0    Order Specific Question:   Supervising Provider    Answer:   Denita Lung [[id number]]   Return in 6 months for follow up and in 11/2021 for annual exam.  Irene Pap, PA-C

## 2021-04-17 ENCOUNTER — Ambulatory Visit (INDEPENDENT_AMBULATORY_CARE_PROVIDER_SITE_OTHER): Payer: Medicare Other | Admitting: Rehabilitative and Restorative Service Providers"

## 2021-04-17 DIAGNOSIS — R293 Abnormal posture: Secondary | ICD-10-CM

## 2021-04-17 DIAGNOSIS — R29818 Other symptoms and signs involving the nervous system: Secondary | ICD-10-CM

## 2021-04-17 DIAGNOSIS — M79641 Pain in right hand: Secondary | ICD-10-CM

## 2021-04-17 DIAGNOSIS — M25531 Pain in right wrist: Secondary | ICD-10-CM

## 2021-04-17 DIAGNOSIS — R208 Other disturbances of skin sensation: Secondary | ICD-10-CM

## 2021-04-17 DIAGNOSIS — M6281 Muscle weakness (generalized): Secondary | ICD-10-CM

## 2021-04-17 DIAGNOSIS — R278 Other lack of coordination: Secondary | ICD-10-CM | POA: Diagnosis not present

## 2021-04-17 NOTE — Therapy (Signed)
OUTPATIENT OCCUPATIONAL THERAPY TREATMENT NOTE & Progress Note   Patient Name: Cassandra Stewart MRN: [id number] DOB:Feb 24, 1951, 71 y.o., female Today's Date: 04/17/2021  PCP: Marcellina Millin REFERRING PROVIDER: Sherilyn Cooter, MD  Progress Note  Reporting Period 03/13/21 to 04/10/21  See note below for Objective Data and Assessment of Progress/Goals.       OT End of Session - 04/17/21 1129     Visit Number 5    Number of Visits 20    Date for OT Re-Evaluation 05/11/21    Authorization Type [employer] HMO    Authorization - Visit Number 5    Progress Note Due on Visit 15    OT Start Time 1130    OT Stop Time 1220    OT Time Calculation (min) 50 min    Activity Tolerance Patient tolerated treatment well;Patient limited by lethargy;Patient limited by pain;Patient limited by fatigue    Behavior During Therapy [facility] for tasks assessed/performed   she was lethergic, eyes closing at times              Past Medical History:  Diagnosis Date   Elevated LFTs    Fatty liver    on [place].    Hyperlipemia    Hypertension    Inguinal hernia    2020 CT   Stage 3b chronic kidney disease ([place])    Thoracic spondylosis with myelopathy    Umbilical hernia    7371 CT   Uncontrolled type 2 diabetes with neuropathy    Wheelchair bound    paraplegia of Leg - Left   Past Surgical History:  Procedure Laterality Date   APPENDECTOMY  1969   BREAST BIOPSY Right    CATARACT EXTRACTION     May 21016   LUMBAR DISC SURGERY     LUMBAR LAMINECTOMY/DECOMPRESSION MICRODISCECTOMY N/A 07/06/2015   Procedure: Thoracic ten- eleven laminectomy for spinal canal decompression;  Surgeon: Ashok Pall, MD;  Location: [facility];  Service: Orthopedics;  Laterality: N/A;   Patient Active Problem List   Diagnosis Date Noted   Stage 3 chronic kidney disease ([place]) 04/16/2021   Type 2 diabetes mellitus with diabetic nephropathy ([place]) 04/16/2021   Wrist pain, chronic, right 03/08/2021   Palpitation  03/08/2021   Sleep disturbance 03/08/2021   Snoring 03/08/2021   Extensor carpi ulnaris tendinitis 03/01/2021   Numbness and tingling in right hand 03/01/2021   Paraplegia ([place]) 01/12/2021   History of sleep apnea 12/04/2020   Swelling of limb 10/16/2020   Nerve pain 06/07/2020   Hyperlipidemia associated with type 2 diabetes mellitus ([place]) 03/02/2020   Atherosclerosis of aorta ([place]) 03/02/2020   Wheelchair dependence 08/04/2019   Thoracic spondylosis with myelopathy 08/04/2019   Bilateral carpal tunnel syndrome 07/06/2019   Family history of heart disease 12/04/2018   At risk for heart disease 12/04/2018   Abdominal pain, chronic, left lower quadrant 08/25/2018   Personal history of noncompliance with medical treatment, presenting hazards to health 01/29/2017   Low serum vitamin B12 11/15/2016   CKD (chronic kidney disease) stage 3, GFR 30-59 ml/min (HCC) 10/24/2016   Fatty liver disease, nonalcoholic 08/21/9483   Hyperlipemia    Estrogen deficiency 10/25/2015   Thoracic spinal stenosis 07/06/2015   UTI (lower urinary tract infection) 07/04/2015   Poorly controlled type 2 diabetes mellitus with peripheral neuropathy ([place]) 07/04/2015   Essential hypertension 07/04/2015   Morbid obesity ([place]) 07/04/2015   Inability to walk 07/04/2015   Weakness of left leg 07/04/2015   Elevated LFTs 07/04/2015  ONSET DATE: ~1 year ago  REFERRING DIAG: ECU tendonitis R   THERAPY DIAG:  Other lack of coordination  Other disturbances of skin sensation  Pain in right wrist  Pain in right hand  Abnormal posture  Muscle weakness (generalized)  Other symptoms and signs involving the nervous system   PRECAUTIONS: none  SUBJECTIVE:  04/17/21: She noticed that pillow under elbow helped at night. She also states wrist is still hurting and she "doesn't understand it." She states she feels like hre fingers have OA pain that "moves around."   04/10/21: She states new wrist strap is much  better and "I can do more." She is very concerned about her global Rt hand numbness at night.  04/03/21: Pt states her pre-fab wrist cock-up is only helping "a little" and c/o increased tingling in median nerve dist in Rt hand recently. She states worse at night, back sleeper. Has been using power w/c as opposedto manual w/c for past 1-2 weeks and pain in wrist is better.   03/27/21: Pt reports that she is wearing her pre-fab wrist splint "most of the time". She removes it to bathe, wash her hands etc. She rates her pain as 6/10/ today in ulnar sided hand and dorsal wrist right.   PAIN:  Are you having pain? Yes NPRS scale: 6/10 Pain location: "the whole hand"  Pain orientation: Right  PAIN TYPE: aching Pain description: constant  Aggravating factors: Functional use of right dominant hand. Using her manual wheelchair (She uses power w/c in public settings and manual at home Relieving factors: Rest, heat   PERTINENT HISTORY:  Pt with recent ECU tendonitis, c/o increased Median N tingling in past 4 months but does have hx of diabetic neuropathy. Typically she uses manual w/c around house and power w/c in community due to weakness in feet/legs and foot troubles due to DM II neuropathy. She has a lot of chronic wear & tear to b/l UEs due to w/c use and difficult transfers. She is also very sedate, typically seated in w/c with no significant physical exercise or activity. Due to chronic kidney disease, she avoids oral anti-inflammatory medications. She is also not a candidate for oral corticosteroids secondary to diabetes.    OBJECTIVE:   TODAY'S TREATMENT: 04/17/21: OT has pt performs HEP from neck to hand, A/ROM, stretches, and nerve glides, OT giving verbal cues for form and for pt to not cause pain. Finger stretches done manually by OT to endure pt is not feeling pain during. She has no issues with head/neck, states feels looser. Hand did not hurt during manual or stretches- IF and MF still very  tight. OT also edu on importance of daily activities, not overstressing wrist with fnl transfers. OT also discusses PT referral and gives a note for MD to sign. She agrees that her legs and walking need improved. No increased pain at end she feels better.    04/10/21: She performs A/ROM, gripping, FMS activities and discusses self care and home care for reassessment and educational purposes. OT also provides updated HEP for nerve, tendon issues as well as new head/neck motions to try to better neck A/ROM as it may play a role in sleeping numbness/pain. OT discusses sleep positions in detail with pt, and she is encouraged to try supportive pillows and monitor her symptoms. OT educates again on nerve issues- compression and relief through bracing, modifications to habits, and new nerve glides. She demos back well, but needs to be very consistent with HEP.  She is also cautioned to try to not be sedate at home, especially since she has been in power w/c more often now.      PATIENT EDUCATION: Education details: brace use, protective postures, exercises, etc.  Person educated: Patient Education method: Explanation, Demonstration, Tactile cues, Verbal cues, and Handouts Education comprehension: verbalized understanding, returned demonstration, and needs further education   HEP:  Access Code: [facility] URL: [email or web address] Date: 04/10/2021 Prepared by: Benito Mccreedy    ASSESSMENT: Updated 04/10/21   CLINICAL IMPRESSION: 04/17/21: Pt states little to no improvements, but it's only been a short time. She improves with every session, and if she can continue at home, she will do great. Getting PT will greatly help as well.    04/10/21: Pt has a multitude of issues including chronic, acute, nerves, tendonitis, poor mobility, etc. OT found significantly poor neck rotation, which may effect nerves as well- that will be addressed as well. ECU is less painful today, so rest and stretches  are helping, but L non-dom hand is also tight. With so many difficulties treatment will be a long, hard road. OT recommends against sx management of median N. compression due to reliance on UEs for transfers.   She also would likely benefit from PT for transfers, leg strength and balance, which may help stop the cascading, systemic problem of w/c overuse, sedate lifestyle, etc.   SENSATION: Semmes Weinstein Monofilament scale: Rt hand: 2.83 Ulnar Nerve Dist (WNL); 3.61 Median Nerve Dist (Diminished Protective Sensation)    COORDINATION: Comments: decreased Rt hand compared to Lt hand per 9HPT coordination testing (33sec L hand vs 37.5 sec Rt dominant hand)    EDEMA: Improving in rt UE, negligible when compared to Lt UE now    HAND A/PROM: Still unable to make a full fist due to intrinsic tightness (positive Bunnell Test for intrinsic tightness). Working on with HEP    Hand Function/Strength:  Grip strength: 11# Rt hand painful vs 35.4# Lt hand      GOALS: Goals reviewed with patient? yes   SHORT TERM GOALS: (STG required if POC>30 days)   STG Name Target Date Goal status  1 Pt wil lbe Mod I initial home program R UE Baseline:  04/10/2021 Met 04/10/21  2 Pt will be mod I edema control techniques R dorsal wrist and hand as seen in clinic Baseline:  04/10/2021 Met 04/10/21  3 Pt will be Mod I neutral wrist for basic ADL's  Baseline:  04/10/2021 Progressing    LONG TERM GOALS:    LTG Name Target Date Goal status  1 Pt will be Mod I updated HEP R UE Baseline:  05/08/2021 Progressing   2 Pt will be Mod I joint protection principles R UE for ADL tasks Baseline:  05/08/2021 Progressing  3 Pt will report decreased pain in R dorsal wrist as seen by pain rating of 3/10 or less with light ADL's Baseline:  05/08/2021 Progressing down to 3/10 at rest now 04/10/21  4 Pt will be Mod I possible a/e options to assist with independence with ADL's Baseline:  05/08/2021 INITIAL A/E yet to be discussed,  we will work on remediation first   5 Decrease Impairment per Quick DASH from 68% to 40% or better.  05/08/21 New goal 04/10/21  6 Increase b/l wrist flex A/ROM to at least 55* b/l for better flexibility when grasping.  05/08/21 New goal 04/10/21  7 Increase rt dom grip from 11# painful to at least 25# for BADLs in the home  05/08/21 New goal 04/10/21    PLAN: OT FREQUENCY: 2x/week   OT DURATION: 4 weeks   PLANNED INTERVENTIONS: self care/ADL training, therapeutic exercise, therapeutic activity, manual therapy, splinting, and ultrasound   PLAN FOR NEXT SESSION:  Try UBE  and light strength with wrist brace after stretches as tol   CONSULTED AND AGREED WITH PLAN OF CARE: Patient   Benito Mccreedy, OTR/L, CHT 04/17/2021, 12:24 PM

## 2021-04-20 ENCOUNTER — Encounter
Payer: Medicare Other | Attending: Physical Medicine and Rehabilitation | Admitting: Physical Medicine and Rehabilitation

## 2021-04-20 ENCOUNTER — Other Ambulatory Visit: Payer: Self-pay

## 2021-04-20 ENCOUNTER — Encounter: Payer: Self-pay | Admitting: Physical Medicine and Rehabilitation

## 2021-04-20 VITALS — BP 123/76 | HR 77 | Ht 64.5 in | Wt 216.0 lb

## 2021-04-20 DIAGNOSIS — M792 Neuralgia and neuritis, unspecified: Secondary | ICD-10-CM | POA: Diagnosis not present

## 2021-04-20 DIAGNOSIS — G5603 Carpal tunnel syndrome, bilateral upper limbs: Secondary | ICD-10-CM | POA: Insufficient documentation

## 2021-04-20 DIAGNOSIS — G8220 Paraplegia, unspecified: Secondary | ICD-10-CM | POA: Insufficient documentation

## 2021-04-20 DIAGNOSIS — Z993 Dependence on wheelchair: Secondary | ICD-10-CM | POA: Diagnosis not present

## 2021-04-20 MED ORDER — DICLOFENAC SODIUM 75 MG PO TBEC: 75.0000 mg | DELAYED_RELEASE_TABLET | Freq: Two times a day (BID) | ORAL | 0 refills | Status: DC

## 2021-04-20 MED ORDER — TRAMADOL HCL 50 MG PO TABS: 50.0000 mg | ORAL_TABLET | Freq: Four times a day (QID) | ORAL | 0 refills | Status: DC | PRN

## 2021-04-20 NOTE — Progress Notes (Signed)
Pt is a 71 yr old R handed female  With hx of HTN, DM- poorly controlled per chart; obesity- BMI >35, HLD, Vit B levels are high- with hx of thoracic spondylosis/myelopathy and LE weakness- mainly LLE weak.  Here for f/u on weakness. w/c dependent. In power w/c.  Also new R wrist pain- ulnar styloid pain/TTP Here for f/u on Thoracic nontraumatic myelopathy Also has carpal tunnel syndrome on R.    Stopped wearing big jewelry and doing stretching exercises, so only having neck pain intermittently.   Constant pain in hands- seeing hand Ortho specialist- they also suggested gait therapy.    Does stretching of hands- works for a couple of hours.  R>L hands go berserk- Saw Dr Lajoyce Corners and Dr Corrin Parker- and sent to OT- hand therapy 1x/week.   Pain in hands aching and throbbing-  also numbness/tingling- no feeling in 1st 3 fingers on R hand.  Sleeps with arms on pillow- helps a little-   Was told by hand therapist to not wear carpal tunnel braces with metal. To wear her current brace freet brace/splint on R wrist.   Hasn't taken tramadol in awhile- thought tylenol and gabapentin  Takes gabapentin 300 mg TID- no side effects with it.    Since in power w/c not manual w/c- swelling at R wrist is better.  Cannot get as close ot cabinets- not in disabled apartment.  Social Hx:  Lives alone-  And has to do all cooking and cleaning- and has no one that could help her after carpal tunnel surgery.  [employer] won't pay for care at the home. Doesn't have medicaid.    Cannot have steroid injection- since years ago had steroid injection and [facility] put her in the hospital due to being so high.    Exam: Awake, alert, appropriate, in power w/c; NAD Numb/absent sensation in median distribution- medial 4th digit and 2nd digit absent compared to ulnar distribution.  Thenar eminence atrophy noted on R.  Tinels (+) at R wrist   Plan: Try Diclofenac- 75 mg 2x/day x 1 week- NO MORE THAN THAT- since has Cr of 1.1-  kidney function is impaired and so doesn't want to cause more kidney issues-   also cannot tolerate inj steroids due to being put in hospital in past after steroid injections. Had multiple fingers done at the time. Might be able to tolerate if has 1/2 dose of steroid and only 1 injection. Tell Hydrographic surveyor.   3. Wait on trigger point injections- shoulders are doing better.   4. Pt is paraplegic- so gait training isn't go to fix her issues.    5. See if [employer] can change joystick to L side. See if that's possible so doesn't put so much pressure on R hand.    6. Let's try to increase Gabapentin  to 600 mg 2x/day x 1 week; then if still an issues, can go to 600 mg 3x/day- not higher than that- try it and then call  me to let me call in Rx if helpful   7. Try tramadol as needed!!!!! Tramadol 50 mg q6 hours as needed- #28- sent in for 1 week- if effective, will write refills-   8. F/U in 3 months- double appt- SCI   I spent a total of  32   minutes on total care today- >50% coordination of care- due to carpal tunnel and how ot treat that and nerve pain

## 2021-04-20 NOTE — Patient Instructions (Signed)
Plan: Try Diclofenac- 75 mg 2x/day x 1 week- NO MORE THAN THAT- since has Cr of 1.1- kidney function is impaired and so doesn't want to cause more kidney issues-   also cannot tolerate inj steroids due to being put in hospital in past after steroid injections. Had multiple fingers done at the time. Might be able to tolerate if has 1/2 dose of steroid and only 1 injection. Tell Hydrographic surveyor.   3. Wait on trigger point injections- shoulders are doing better.   4. Pt is paraplegic- so gait training isn't go to fix her issues.    5. See if [employer] can change joystick to L side. See if that's possible so doesn't put so much pressure on R hand.    6. Let's try to increase Gabapentin  to 600 mg 2x/day x 1 week; then if still an issues, can go to 600 mg 3x/day- not higher than that- try it and then call  me to let me call in Rx if helpful   7. Try tramadol as needed!!!!! Tramadol 50 mg q6 hours as needed- #28- sent in for 1 week- if effective, will write refills-   8. F/U in 3 months- double appt- SCI

## 2021-04-24 ENCOUNTER — Encounter: Payer: Medicare Other | Admitting: Rehabilitative and Restorative Service Providers"

## 2021-04-30 ENCOUNTER — Telehealth: Payer: Self-pay | Admitting: *Deleted

## 2021-04-30 NOTE — Telephone Encounter (Signed)
Cassandra Stewart called and states that Dr Dagoberto Ligas asked her to call back after taking the diclofenac for a week. She reports that she is doing better and can use her hand. She also is still with the gabapentin 2xday.  ?

## 2021-05-01 DIAGNOSIS — E118 Type 2 diabetes mellitus with unspecified complications: Secondary | ICD-10-CM | POA: Diagnosis not present

## 2021-05-01 DIAGNOSIS — Z794 Long term (current) use of insulin: Secondary | ICD-10-CM | POA: Diagnosis not present

## 2021-05-02 MED ORDER — TRAMADOL HCL 50 MG PO TABS: 50.0000 mg | ORAL_TABLET | Freq: Two times a day (BID) | ORAL | 2 refills | Status: DC | PRN

## 2021-05-02 MED ORDER — DICLOFENAC SODIUM 75 MG PO TBEC: 75.0000 mg | DELAYED_RELEASE_TABLET | Freq: Every day | ORAL | 1 refills | Status: DC | PRN

## 2021-05-02 NOTE — Telephone Encounter (Signed)
The night pain is much better and can now use hand much bette.r  ?Still taking tramadol as needed.  ?Has a few tablets.  ? ?Will give diclofenac 75 mg every other day as needed- to try and reduce stress on kidneys. But to kepe things under control, so so much better.  ? ?Also , will con't tramadol 1-2 tabs/day as needed for pain- will write rx with 60 tabs 1 refill ?

## 2021-05-18 ENCOUNTER — Telehealth: Payer: Self-pay

## 2021-05-18 NOTE — Telephone Encounter (Signed)
Pt called to advise she has been having issues with her Dexcom G6 and would like to update to a G7. Pt was advised to contact Dexcom tech support at [phone] until order is updated with [employer]. ?

## 2021-05-18 NOTE — Telephone Encounter (Signed)
Updated order submitted via [employer] for Dexcom G7 equipment. Awaiting feedback. ?

## 2021-05-22 ENCOUNTER — Institutional Professional Consult (permissible substitution): Payer: Medicare Other | Admitting: Neurology

## 2021-05-27 ENCOUNTER — Other Ambulatory Visit: Payer: Self-pay

## 2021-05-27 ENCOUNTER — Emergency Department (HOSPITAL_COMMUNITY): Payer: Medicare Other

## 2021-05-27 ENCOUNTER — Encounter (HOSPITAL_COMMUNITY): Payer: Self-pay

## 2021-05-27 ENCOUNTER — Emergency Department (HOSPITAL_COMMUNITY)
Admission: EM | Admit: 2021-05-27 | Discharge: 2021-05-27 | Disposition: A | Discharge status: Home / Self Care | Payer: Medicare Other | Attending: Emergency Medicine | Admitting: Emergency Medicine

## 2021-05-27 DIAGNOSIS — N189 Chronic kidney disease, unspecified: Secondary | ICD-10-CM | POA: Diagnosis not present

## 2021-05-27 DIAGNOSIS — R112 Nausea with vomiting, unspecified: Secondary | ICD-10-CM | POA: Insufficient documentation

## 2021-05-27 DIAGNOSIS — R6889 Other general symptoms and signs: Secondary | ICD-10-CM | POA: Diagnosis not present

## 2021-05-27 DIAGNOSIS — R1013 Epigastric pain: Secondary | ICD-10-CM | POA: Insufficient documentation

## 2021-05-27 DIAGNOSIS — Z7984 Long term (current) use of oral hypoglycemic drugs: Secondary | ICD-10-CM | POA: Insufficient documentation

## 2021-05-27 DIAGNOSIS — R0602 Shortness of breath: Secondary | ICD-10-CM | POA: Diagnosis not present

## 2021-05-27 DIAGNOSIS — R0902 Hypoxemia: Secondary | ICD-10-CM | POA: Diagnosis not present

## 2021-05-27 DIAGNOSIS — R079 Chest pain, unspecified: Secondary | ICD-10-CM | POA: Diagnosis not present

## 2021-05-27 DIAGNOSIS — R1032 Left lower quadrant pain: Secondary | ICD-10-CM | POA: Diagnosis not present

## 2021-05-27 DIAGNOSIS — I129 Hypertensive chronic kidney disease with stage 1 through stage 4 chronic kidney disease, or unspecified chronic kidney disease: Secondary | ICD-10-CM | POA: Diagnosis not present

## 2021-05-27 DIAGNOSIS — Z79899 Other long term (current) drug therapy: Secondary | ICD-10-CM | POA: Diagnosis not present

## 2021-05-27 DIAGNOSIS — R197 Diarrhea, unspecified: Secondary | ICD-10-CM | POA: Diagnosis not present

## 2021-05-27 DIAGNOSIS — I774 Celiac artery compression syndrome: Secondary | ICD-10-CM | POA: Diagnosis not present

## 2021-05-27 DIAGNOSIS — Z7982 Long term (current) use of aspirin: Secondary | ICD-10-CM | POA: Insufficient documentation

## 2021-05-27 DIAGNOSIS — M5134 Other intervertebral disc degeneration, thoracic region: Secondary | ICD-10-CM | POA: Diagnosis not present

## 2021-05-27 DIAGNOSIS — R739 Hyperglycemia, unspecified: Secondary | ICD-10-CM | POA: Diagnosis not present

## 2021-05-27 DIAGNOSIS — K429 Umbilical hernia without obstruction or gangrene: Secondary | ICD-10-CM | POA: Diagnosis not present

## 2021-05-27 DIAGNOSIS — E1165 Type 2 diabetes mellitus with hyperglycemia: Secondary | ICD-10-CM | POA: Insufficient documentation

## 2021-05-27 DIAGNOSIS — R109 Unspecified abdominal pain: Secondary | ICD-10-CM | POA: Diagnosis not present

## 2021-05-27 DIAGNOSIS — Z794 Long term (current) use of insulin: Secondary | ICD-10-CM | POA: Insufficient documentation

## 2021-05-27 DIAGNOSIS — Z743 Need for continuous supervision: Secondary | ICD-10-CM | POA: Diagnosis not present

## 2021-05-27 LAB — CBC WITH DIFFERENTIAL/PLATELET
Abs Immature Granulocytes: 0.03 10*3/uL (ref 0.00–0.07)
Basophils Absolute: 0 10*3/uL (ref 0.0–0.1)
Basophils Relative: 0 %
Eosinophils Absolute: 0 10*3/uL (ref 0.0–0.5)
Eosinophils Relative: 0 %
HCT: 39.7 % (ref 36.0–46.0)
Hemoglobin: 12.3 g/dL (ref 12.0–15.0)
Immature Granulocytes: 0 %
Lymphocytes Relative: 9 %
Lymphs Abs: 0.9 10*3/uL (ref 0.7–4.0)
MCH: 28.9 pg (ref 26.0–34.0)
MCHC: 31 g/dL (ref 30.0–36.0)
MCV: 93.2 fL (ref 80.0–100.0)
Monocytes Absolute: 0.4 10*3/uL (ref 0.1–1.0)
Monocytes Relative: 4 %
Neutro Abs: 9.1 10*3/uL — ABNORMAL HIGH (ref 1.7–7.7)
Neutrophils Relative %: 87 %
Platelets: 312 10*3/uL (ref 150–400)
RBC: 4.26 MIL/uL (ref 3.87–5.11)
RDW: 13.3 % (ref 11.5–15.5)
WBC: 10.5 10*3/uL (ref 4.0–10.5)
nRBC: 0 % (ref 0.0–0.2)

## 2021-05-27 LAB — URINALYSIS, ROUTINE W REFLEX MICROSCOPIC
Bilirubin Urine: NEGATIVE
Glucose, UA: >=500 mg/dL — AB
Hgb urine dipstick: NEGATIVE
Ketones, ur: NEGATIVE mg/dL
Nitrite: NEGATIVE
Protein, ur: 30 mg/dL — AB
Specific Gravity, Urine: 1.017 (ref 1.005–1.030)
pH: 5 (ref 5.0–8.0)

## 2021-05-27 LAB — COMPREHENSIVE METABOLIC PANEL
ALT: 35 U/L (ref 0–44)
AST: 34 U/L (ref 15–41)
Albumin: 3.6 g/dL (ref 3.5–5.0)
Alkaline Phosphatase: 71 U/L (ref 38–126)
Anion gap: 9 (ref 5–15)
BUN: 20 mg/dL (ref 8–23)
CO2: 25 mmol/L (ref 22–32)
Calcium: 9 mg/dL (ref 8.9–10.3)
Chloride: 104 mmol/L (ref 98–111)
Creatinine, Ser: 1.07 mg/dL — ABNORMAL HIGH (ref 0.44–1.00)
GFR, Estimated: 55 mL/min — ABNORMAL LOW (ref >60)
Glucose, Bld: 294 mg/dL — ABNORMAL HIGH (ref 70–99)
Potassium: 4.4 mmol/L (ref 3.5–5.1)
Sodium: 138 mmol/L (ref 135–145)
Total Bilirubin: 0.7 mg/dL (ref 0.3–1.2)
Total Protein: 7 g/dL (ref 6.5–8.1)

## 2021-05-27 LAB — MAGNESIUM: Magnesium: 1.5 mg/dL — ABNORMAL LOW (ref 1.7–2.4)

## 2021-05-27 LAB — LACTIC ACID, PLASMA
Lactic Acid, Venous: 2.8 mmol/L (ref 0.5–1.9)
Lactic Acid, Venous: 2.1 mmol/L (ref 0.5–1.9)

## 2021-05-27 LAB — TROPONIN I (HIGH SENSITIVITY)
Troponin I (High Sensitivity): 8 ng/L (ref <18)
Troponin I (High Sensitivity): 9 ng/L (ref <18)

## 2021-05-27 LAB — LIPASE, BLOOD: Lipase: 39 U/L (ref 11–51)

## 2021-05-27 MED ORDER — IOHEXOL 350 MG/ML SOLN
100.0000 mL | Freq: Once | INTRAVENOUS | Status: AC | PRN
  Administered 2021-05-27: 100 mL via INTRAVENOUS

## 2021-05-27 MED ORDER — LACTATED RINGERS IV BOLUS
1000.0000 mL | Freq: Once | INTRAVENOUS | Status: AC
  Administered 2021-05-27: 1000 mL via INTRAVENOUS

## 2021-05-27 MED ORDER — ALUM & MAG HYDROXIDE-SIMETH 200-200-20 MG/5ML PO SUSP
30.0000 mL | Freq: Once | ORAL | Status: AC
  Administered 2021-05-27: 30 mL via ORAL
  Filled 2021-05-27: qty 30

## 2021-05-27 MED ORDER — FENTANYL CITRATE PF 50 MCG/ML IJ SOSY
50.0000 ug | PREFILLED_SYRINGE | Freq: Once | INTRAMUSCULAR | Status: DC
  Filled 2021-05-27: qty 1

## 2021-05-27 MED ORDER — MAGNESIUM SULFATE 2 GM/50ML IV SOLN
2.0000 g | Freq: Once | INTRAVENOUS | Status: AC
  Administered 2021-05-27: 2 g via INTRAVENOUS
  Filled 2021-05-27: qty 50

## 2021-05-27 MED ORDER — PANTOPRAZOLE SODIUM 20 MG PO TBEC: 20.0000 mg | DELAYED_RELEASE_TABLET | Freq: Two times a day (BID) | ORAL | 0 refills | Status: DC

## 2021-05-27 MED ORDER — LACTATED RINGERS IV SOLN: INTRAVENOUS | Status: DC

## 2021-05-27 MED ORDER — FAMOTIDINE IN NACL 20-0.9 MG/50ML-% IV SOLN
20.0000 mg | Freq: Once | INTRAVENOUS | Status: AC
  Administered 2021-05-27: 20 mg via INTRAVENOUS
  Filled 2021-05-27: qty 50

## 2021-05-27 MED ORDER — LIDOCAINE VISCOUS HCL 2 % MT SOLN
15.0000 mL | Freq: Once | OROMUCOSAL | Status: AC
  Administered 2021-05-27: 15 mL via ORAL
  Filled 2021-05-27: qty 15

## 2021-05-27 MED ORDER — ACETAMINOPHEN 325 MG PO TABS
650.0000 mg | ORAL_TABLET | Freq: Once | ORAL | Status: AC
  Administered 2021-05-27: 650 mg via ORAL
  Filled 2021-05-27: qty 2

## 2021-05-27 NOTE — ED Triage Notes (Signed)
Pt arrived to ED via EMS from home w/ c/o epigastric abd pain w/ radiation down abd w/ N/V/D onset 1030 today. Pt is diabetic and her dexcom hasn't been working for several weeks. Pt didn't take any insulin today d/t she didn't eat. Pt has not been able to check CBG at home. VSS w/ EMS, CBG 306. EMS gave 4mg  IV zofran and 184mL NS. 22g R hand. ?

## 2021-05-27 NOTE — ED Provider Notes (Signed)
?[facility] ?Provider Note ? ? ?CSN: [id number] ?Arrival date & time: 05/27/21  1443 ? ?  ? ?History ? ?Chief Complaint  ?Patient presents with  ? Abdominal Pain  ? N/V/D  ? ? ?Cassandra Stewart is a 71 y.o. female. ? ?HPI ?Patient presents for abdominal pain, nausea, and vomiting.  Her medical history includes T2DM, HTN, obesity, poor mobility, thoracic spinal stenosis, CKD, HLD, chronic left lower quadrant abdominal pain, and fatty liver.  She reports that, this morning, she was in her normal state of health.  She did take her morning medicines.  She had not yet eaten when she developed a sudden onset epigastric pain.  At time of onset, it was 10/10 in severity.  She also experienced nausea and vomiting.  She estimates that she vomited 8 times.  EMS was called.  Patient did not take anything for pain at home other than her daily morning Tylenol prior to onset.  EMS arrived and noted some moderate hyperglycemia.  Her vital signs were otherwise normal.  Patient was given 4 mg of Zofran and 400 cc of IVF during transit.  She reports that she has had resolution of her nausea.  She reports that her current pain is 4/10 in severity.  It does not radiate.  In addition to her abdominal pain, nausea, and vomiting this morning, patient reports that she has had intermittent shortness of breath.  Currently, she denies any dyspnea. ?  ? ?Home Medications ?Prior to Admission medications   ?Medication Sig Start Date End Date Taking? Authorizing Provider  ?pantoprazole (PROTONIX) 20 MG tablet Take 1 tablet (20 mg total) by mouth 2 (two) times daily. 05/27/21 06/26/21 Yes Godfrey Pick, MD  ?Accu-Chek FastClix Lancets MISC USE FOUR TIMES DAILY AS DIRECTED 03/16/20   Philemon Kingdom, MD  ?ACCU-CHEK GUIDE test strip USE AS DIRECTED TO TEST BLOOD SUGAR FOUR TIMES DAILY 12/11/20   Philemon Kingdom, MD  ?acetaminophen (TYLENOL) 650 MG CR tablet Take 650 mg by mouth every 8 (eight) hours as needed for  pain.    [provider]  ?albuterol (VENTOLIN HFA) 108 (90 Base) MCG/ACT inhaler Inhale 1-2 puffs into the lungs every 4 (four) hours as needed for wheezing or shortness of breath. ?Patient not taking: Reported on 04/16/2021 05/13/14   [provider]  ?amLODipine (NORVASC) 10 MG tablet Take 1 tablet (10 mg total) by mouth daily. 04/16/21   Irene Pap, PA-C  ?aspirin EC 81 MG tablet Take 81 mg by mouth every evening.    [provider]  ?Blood Glucose Monitoring Suppl (ACCU-CHEK AVIVA PLUS) w/Device KIT Use as advised 03/30/19   Philemon Kingdom, MD  ?Cholecalciferol (VITAMIN D PO) Take 1,000 mg by mouth daily.     [provider]  ?Continuous Blood Gluc Receiver ([place]) Turner by Does not apply route.    [provider]  ?Cyanocobalamin (VITAMIN B 12 PO) Take 5,000 mg by mouth daily.    [provider]  ?diclofenac (VOLTAREN) 75 MG EC tablet Take 1 tablet (75 mg total) by mouth daily as needed. For wrist pain/carpal tunnel syndrome 05/02/21   Lovorn, Jinny Blossom, MD  ?diclofenac Sodium (VOLTAREN) 1 % GEL Apply 4 g topically 4 (four) times daily. 11/28/20   Deno Etienne, DO  ?dicyclomine (BENTYL) 10 MG capsule Take 1 capsule (10 mg total) by mouth every 8 (eight) hours as needed for spasms. ?Patient not taking: Reported on 04/16/2021 06/23/20   Armbruster, Carlota Raspberry, MD  ?  Dulaglutide (TRULICITY) 3 IH/4.7QQ SOPN Inject 3 mg into the skin once a week. 04/09/21   Philemon Kingdom, MD  ?gabapentin (NEURONTIN) 300 MG capsule TAKE 1 CAPSULE(300 MG) BY MOUTH THREE TIMES DAILY 12/11/20   Denita Lung, MD  ?hydrochlorothiazide (MICROZIDE) 12.5 MG capsule Take 1 capsule (12.5 mg total) by mouth daily. 04/16/21   Francis Gaines B, PA-C  ?insulin lispro (HUMALOG) 100 UNIT/ML KwikPen INJECT 7-10 UNITS UNDER THE SKIN 2-3 TIMES DAILY 12/06/20   Philemon Kingdom, MD  ?Insulin Pen Needle (CAREFINE PEN NEEDLES) 32G X 4 MM MISC Use 4x a day 09/14/19   Philemon Kingdom, MD   ?lovastatin (MEVACOR) 40 MG tablet Take 1 tablet (40 mg total) by mouth at bedtime. 04/16/21   Irene Pap, PA-C  ?metFORMIN (GLUCOPHAGE-XR) 500 MG 24 hr tablet TAKE 1 TABLET(500 MG) BY MOUTH TWICE DAILY 12/11/20   Philemon Kingdom, MD  ?nitroGLYCERIN (NITROSTAT) 0.4 MG SL tablet Place 1 tablet (0.4 mg total) under the tongue every 5 (five) minutes as needed for chest pain. ?Patient not taking: Reported on 04/16/2021 01/13/19   Pixie Casino, MD  ?polyethylene glycol (MIRALAX / GLYCOLAX) 17 g packet Take 17 g by mouth as needed. ?Patient not taking: Reported on 04/16/2021    [provider]  ?TOUJEO SOLOSTAR 300 UNIT/ML Solostar Pen Inject 40 Units into the skin at bedtime. 08/04/20   Philemon Kingdom, MD  ?traMADol (ULTRAM) 50 MG tablet Take 1 tablet (50 mg total) by mouth every 12 (twelve) hours as needed. 05/02/21   Courtney Heys, MD  ?   ? ?Allergies    ?Other, Chlorhexidine, and Oxycodone hcl   ? ?Review of Systems   ?Review of Systems  ?Respiratory:  Positive for shortness of breath.   ?Gastrointestinal:  Positive for abdominal pain, nausea and vomiting.  ?All other systems reviewed and are negative. ? ?Physical Exam ?Updated Vital Signs ?BP 128/64 (BP Location: Right Arm)   Pulse 80   Temp 98.5 ?F (36.9 ?C) (Oral)   Resp (!) 22   Ht 5' 4.5" (1.638 m)   Wt 96.6 kg   SpO2 93%   BMI 36.00 kg/m?  ?Physical Exam ?Vitals and nursing note reviewed.  ?Constitutional:   ?   General: She is not in acute distress. ?   Appearance: She is well-developed. She is not ill-appearing, toxic-appearing or diaphoretic.  ?HENT:  ?   Head: Normocephalic and atraumatic.  ?   Mouth/Throat:  ?   Mouth: Mucous membranes are moist.  ?   Pharynx: Oropharynx is clear.  ?Eyes:  ?   Conjunctiva/sclera: Conjunctivae normal.  ?   Pupils: Pupils are equal, round, and reactive to light.  ?Cardiovascular:  ?   Rate and Rhythm: Normal rate and regular rhythm.  ?   Heart sounds: No murmur heard. ?Pulmonary:  ?   Effort:  Pulmonary effort is normal. No respiratory distress.  ?   Breath sounds: Normal breath sounds.  ?Abdominal:  ?   Palpations: Abdomen is soft.  ?   Tenderness: There is no abdominal tenderness.  ?   Hernia: No hernia is present.  ?Musculoskeletal:     ?   General: No swelling.  ?   Cervical back: Neck supple.  ?Skin: ?   General: Skin is warm and dry.  ?   Capillary Refill: Capillary refill takes less than 2 seconds.  ?   Coloration: Skin is not cyanotic or jaundiced.  ?Neurological:  ?   General: No focal deficit present.  ?  Mental Status: She is alert and oriented to person, place, and time.  ?   Cranial Nerves: No cranial nerve deficit.  ?   Motor: No weakness.  ?Psychiatric:     ?   Mood and Affect: Mood normal.     ?   Behavior: Behavior normal.  ? ? ?ED Results / Procedures / Treatments   ?Labs ?(all labs ordered are listed, but only abnormal results are displayed) ?Labs Reviewed  ?COMPREHENSIVE METABOLIC PANEL - Abnormal; Notable for the following components:  ?    Result Value  ? Glucose, Bld 294 (*)   ? Creatinine, Ser 1.07 (*)   ? GFR, Estimated 55 (*)   ? All other components within normal limits  ?CBC WITH DIFFERENTIAL/PLATELET - Abnormal; Notable for the following components:  ? Neutro Abs 9.1 (*)   ? All other components within normal limits  ?URINALYSIS, ROUTINE W REFLEX MICROSCOPIC - Abnormal; Notable for the following components:  ? APPearance HAZY (*)   ? Glucose, UA >=500 (*)   ? Protein, ur 30 (*)   ? Leukocytes,Ua SMALL (*)   ? Bacteria, UA MANY (*)   ? All other components within normal limits  ?MAGNESIUM - Abnormal; Notable for the following components:  ? Magnesium 1.5 (*)   ? All other components within normal limits  ?LACTIC ACID, PLASMA - Abnormal; Notable for the following components:  ? Lactic Acid, Venous 2.8 (*)   ? All other components within normal limits  ?LACTIC ACID, PLASMA - Abnormal; Notable for the following components:  ? Lactic Acid, Venous 2.1 (*)   ? All other components  within normal limits  ?LIPASE, BLOOD  ?LACTIC ACID, PLASMA  ?TROPONIN I (HIGH SENSITIVITY)  ?TROPONIN I (HIGH SENSITIVITY)  ? ? ?EKG ?EKG Interpretation ? ?Date/Time:  Sunday May 27 2021 15:04:10 EDT ?Ventricular Rat

## 2021-05-28 NOTE — Progress Notes (Signed)
? ?Established Patient Office Visit ? ?Subjective:  ?Patient ID: Cassandra Stewart, female    DOB: September 13, 1950  Age: 71 y.o. MRN: [id number] ? ?CC:  ?Chief Complaint  ?Patient presents with  ? Follow-up  ?  Hospital follow up  ? ? ?HPI ?[name] presents for ED follow up, is sitting in her motorized wheelchair; went on 05/27/2021, diagnosed with GERD and prescribed pantoprazole 20 mg bid; states it makes her have diarrhea, but she is still taking it and eating a bland diet; started Tramadol the day she went to the ED and wonders if that caused her stomach pain, nausea, vomiting, and diarrhea as well; states she is eating, but appetite is decreased; denies ever having a full colonoscopy, but had negative fecal occult blood on stool cards x 3 in 02/2020. ? ?Outpatient Medications Prior to Visit  ?Medication Sig Dispense Refill  ? acetaminophen (TYLENOL) 650 MG CR tablet Take 650 mg by mouth every 8 (eight) hours as needed for pain.    ? amLODipine (NORVASC) 10 MG tablet Take 1 tablet (10 mg total) by mouth daily. 90 tablet 3  ? aspirin EC 81 MG tablet Take 81 mg by mouth every evening.    ? Cholecalciferol (VITAMIN D PO) Take 1,000 mg by mouth daily.     ? Continuous Blood Gluc Receiver ([place]) DEVI by Does not apply route.    ? Cyanocobalamin (VITAMIN B 12 PO) Take 5,000 mg by mouth daily.    ? diclofenac (VOLTAREN) 75 MG EC tablet Take 1 tablet (75 mg total) by mouth daily as needed. For wrist pain/carpal tunnel syndrome 15 tablet 1  ? diclofenac Sodium (VOLTAREN) 1 % GEL Apply 4 g topically 4 (four) times daily. 100 g 0  ? dicyclomine (BENTYL) 10 MG capsule Take 1 capsule (10 mg total) by mouth every 8 (eight) hours as needed for spasms. 60 capsule 1  ? Dulaglutide (TRULICITY) 3 NF/6.2ZH SOPN Inject 3 mg into the skin once a week. 6 mL 3  ? gabapentin (NEURONTIN) 300 MG capsule TAKE 1 CAPSULE(300 MG) BY MOUTH THREE TIMES DAILY 270 capsule 1  ? hydrochlorothiazide (MICROZIDE) 12.5 MG capsule Take  1 capsule (12.5 mg total) by mouth daily. 90 capsule 3  ? insulin lispro (HUMALOG) 100 UNIT/ML KwikPen INJECT 7-10 UNITS UNDER THE SKIN 2-3 TIMES DAILY 30 mL 3  ? Insulin Pen Needle (CAREFINE PEN NEEDLES) 32G X 4 MM MISC Use 4x a day 300 each 3  ? lovastatin (MEVACOR) 40 MG tablet Take 1 tablet (40 mg total) by mouth at bedtime. 90 tablet 3  ? metFORMIN (GLUCOPHAGE-XR) 500 MG 24 hr tablet TAKE 1 TABLET(500 MG) BY MOUTH TWICE DAILY 180 tablet 3  ? nitroGLYCERIN (NITROSTAT) 0.4 MG SL tablet Place 1 tablet (0.4 mg total) under the tongue every 5 (five) minutes as needed for chest pain. 25 tablet 3  ? polyethylene glycol (MIRALAX / GLYCOLAX) 17 g packet Take 17 g by mouth as needed.    ? TOUJEO SOLOSTAR 300 UNIT/ML Solostar Pen Inject 40 Units into the skin at bedtime. 12 mL 3  ? traMADol (ULTRAM) 50 MG tablet Take 1 tablet (50 mg total) by mouth every 12 (twelve) hours as needed. 60 tablet 2  ? Accu-Chek FastClix Lancets MISC USE FOUR TIMES DAILY AS DIRECTED (Patient not taking: Reported on 05/29/2021) 200 each 3  ? ACCU-CHEK GUIDE test strip USE AS DIRECTED TO TEST BLOOD SUGAR FOUR TIMES DAILY (Patient not taking: Reported on 05/29/2021) 400 strip  1  ? albuterol (VENTOLIN HFA) 108 (90 Base) MCG/ACT inhaler Inhale 1-2 puffs into the lungs every 4 (four) hours as needed for wheezing or shortness of breath. (Patient not taking: Reported on 04/16/2021)    ? Blood Glucose Monitoring Suppl (ACCU-CHEK AVIVA PLUS) w/Device KIT Use as advised (Patient not taking: Reported on 05/29/2021) 1 kit 1  ? pantoprazole (PROTONIX) 20 MG tablet Take 1 tablet (20 mg total) by mouth 2 (two) times daily. (Patient not taking: Reported on 05/29/2021) 60 tablet 0  ? ?No facility-administered medications prior to visit.  ? ? ?Allergies  ?Allergen Reactions  ? Other   ?  PT PREFERS TO NOT HAVE ANY NARCOTIC MEDICATIONS  ? Chlorhexidine Rash  ?  Burning after using CHG wipes-used for surgery  ? Oxycodone Hcl   ?  Other reaction(s): Hallucination ?Marked  hallucinations and palpitations following dose of 62m on 04/30/2015 ?  ? ? ?ROS ?Review of Systems  ?Constitutional:  Positive for appetite change. Negative for activity change and chills.  ?HENT:  Negative for congestion and voice change.   ?Eyes:  Negative for pain and redness.  ?Respiratory:  Negative for cough and wheezing.   ?Cardiovascular:  Negative for chest pain.  ?Gastrointestinal:  Negative for abdominal pain, constipation, diarrhea, nausea and vomiting.  ?Endocrine: Negative for polyuria.  ?Genitourinary:  Negative for flank pain and frequency.  ?Skin:  Negative for color change and rash.  ?Allergic/Immunologic: Negative for immunocompromised state.  ?Neurological:  Negative for dizziness.  ?Psychiatric/Behavioral:  Negative for agitation.   ? ?  ?Objective:  ?  ?Physical Exam ?Vitals and nursing note reviewed.  ?Constitutional:   ?   General: She is not in acute distress. ?   Appearance: She is normal weight. She is not ill-appearing.  ?HENT:  ?   Head: Normocephalic and atraumatic.  ?   Right Ear: External ear normal.  ?   Left Ear: External ear normal.  ?Eyes:  ?   Extraocular Movements: Extraocular movements intact.  ?   Conjunctiva/sclera: Conjunctivae normal.  ?   Pupils: Pupils are equal, round, and reactive to light.  ?Cardiovascular:  ?   Rate and Rhythm: Normal rate and regular rhythm.  ?Pulmonary:  ?   Effort: Pulmonary effort is normal.  ?   Breath sounds: Normal breath sounds. No wheezing.  ?Abdominal:  ?   General: Bowel sounds are normal.  ?   Palpations: Abdomen is soft.  ?Musculoskeletal:     ?   General: Normal range of motion.  ?   Cervical back: Normal range of motion.  ?Skin: ?   General: Skin is warm and dry.  ?Neurological:  ?   Mental Status: She is alert and oriented to person, place, and time.  ?Psychiatric:     ?   Mood and Affect: Mood normal.  ? ? ?BP 140/70   Pulse 71   SpO2 95%  ? ?Wt Readings from Last 3 Encounters:  ?05/27/21 213 lb (96.6 kg)  ?04/20/21 216 lb (98 kg)   ?04/16/21 216 lb (98 kg)  ? ? ?Results for orders placed or performed during the hospital encounter of 05/27/21  ?Comprehensive metabolic panel  ?Result Value Ref Range  ? Sodium 138 135 - 145 mmol/L  ? Potassium 4.4 3.5 - 5.1 mmol/L  ? Chloride 104 98 - 111 mmol/L  ? CO2 25 22 - 32 mmol/L  ? Glucose, Bld 294 (H) 70 - 99 mg/dL  ? BUN 20 8 - 23 mg/dL  ?  Creatinine, Ser 1.07 (H) 0.44 - 1.00 mg/dL  ? Calcium 9.0 8.9 - 10.3 mg/dL  ? Total Protein 7.0 6.5 - 8.1 g/dL  ? Albumin 3.6 3.5 - 5.0 g/dL  ? AST 34 15 - 41 U/L  ? ALT 35 0 - 44 U/L  ? Alkaline Phosphatase 71 38 - 126 U/L  ? Total Bilirubin 0.7 0.3 - 1.2 mg/dL  ? GFR, Estimated 55 (L) >60 mL/min  ? Anion gap 9 5 - 15  ?Lipase, blood  ?Result Value Ref Range  ? Lipase 39 11 - 51 U/L  ?CBC with Diff  ?Result Value Ref Range  ? WBC 10.5 4.0 - 10.5 K/uL  ? RBC 4.26 3.87 - 5.11 MIL/uL  ? Hemoglobin 12.3 12.0 - 15.0 g/dL  ? HCT 39.7 36.0 - 46.0 %  ? MCV 93.2 80.0 - 100.0 fL  ? MCH 28.9 26.0 - 34.0 pg  ? MCHC 31.0 30.0 - 36.0 g/dL  ? RDW 13.3 11.5 - 15.5 %  ? Platelets 312 150 - 400 K/uL  ? nRBC 0.0 0.0 - 0.2 %  ? Neutrophils Relative % 87 %  ? Neutro Abs 9.1 (H) 1.7 - 7.7 K/uL  ? Lymphocytes Relative 9 %  ? Lymphs Abs 0.9 0.7 - 4.0 K/uL  ? Monocytes Relative 4 %  ? Monocytes Absolute 0.4 0.1 - 1.0 K/uL  ? Eosinophils Relative 0 %  ? Eosinophils Absolute 0.0 0.0 - 0.5 K/uL  ? Basophils Relative 0 %  ? Basophils Absolute 0.0 0.0 - 0.1 K/uL  ? Immature Granulocytes 0 %  ? Abs Immature Granulocytes 0.03 0.00 - 0.07 K/uL  ?Urinalysis, Routine w reflex microscopic Urine, Clean Catch  ?Result Value Ref Range  ? Color, Urine YELLOW YELLOW  ? APPearance HAZY (A) CLEAR  ? Specific Gravity, Urine 1.017 1.005 - 1.030  ? pH 5.0 5.0 - 8.0  ? Glucose, UA >=500 (A) NEGATIVE mg/dL  ? Hgb urine dipstick NEGATIVE NEGATIVE  ? Bilirubin Urine NEGATIVE NEGATIVE  ? Ketones, ur NEGATIVE NEGATIVE mg/dL  ? Protein, ur 30 (A) NEGATIVE mg/dL  ? Nitrite NEGATIVE NEGATIVE  ? Leukocytes,Ua SMALL (A)  NEGATIVE  ? RBC / HPF 0-5 0 - 5 RBC/hpf  ? WBC, UA 6-10 0 - 5 WBC/hpf  ? Bacteria, UA MANY (A) NONE SEEN  ? Squamous Epithelial / LPF 6-10 0 - 5  ? Mucus PRESENT   ? Hyaline Casts, UA PRESENT   ?Magnesium  ?

## 2021-05-29 ENCOUNTER — Ambulatory Visit (INDEPENDENT_AMBULATORY_CARE_PROVIDER_SITE_OTHER): Payer: Medicare Other | Admitting: Physician Assistant

## 2021-05-29 ENCOUNTER — Encounter: Payer: Self-pay | Admitting: Physician Assistant

## 2021-05-29 VITALS — BP 140/70 | HR 71 | Ht 64.5 in

## 2021-05-29 DIAGNOSIS — K760 Fatty (change of) liver, not elsewhere classified: Secondary | ICD-10-CM

## 2021-05-29 DIAGNOSIS — E1165 Type 2 diabetes mellitus with hyperglycemia: Secondary | ICD-10-CM | POA: Diagnosis not present

## 2021-05-29 DIAGNOSIS — Z6836 Body mass index (BMI) 36.0-36.9, adult: Secondary | ICD-10-CM

## 2021-05-29 DIAGNOSIS — Z794 Long term (current) use of insulin: Secondary | ICD-10-CM

## 2021-05-29 DIAGNOSIS — K219 Gastro-esophageal reflux disease without esophagitis: Secondary | ICD-10-CM | POA: Diagnosis not present

## 2021-05-29 DIAGNOSIS — E1121 Type 2 diabetes mellitus with diabetic nephropathy: Secondary | ICD-10-CM

## 2021-05-29 DIAGNOSIS — I10 Essential (primary) hypertension: Secondary | ICD-10-CM | POA: Diagnosis not present

## 2021-05-29 DIAGNOSIS — E66812 Obesity, class 2: Secondary | ICD-10-CM

## 2021-05-29 MED ORDER — PANTOPRAZOLE SODIUM 20 MG PO TBEC: 20.0000 mg | DELAYED_RELEASE_TABLET | Freq: Two times a day (BID) | ORAL | 3 refills | Status: DC

## 2021-05-29 NOTE — Assessment & Plan Note (Signed)
Stable, eat a low fat diet ?

## 2021-05-29 NOTE — Assessment & Plan Note (Signed)
stable, continue Protonix 20 mg bid, avoid stomach irritants (tomatoes, oranges, lemons, limes, spicy/greasy food), will refer to GI per patient request ? ?

## 2021-05-29 NOTE — Assessment & Plan Note (Signed)
controlled, eat a low salt diet, do not add any salt to food when cooking, avoid processed foods, avoid fried foods ? ?

## 2021-05-29 NOTE — Patient Instructions (Signed)
You will get a call to schedule an appointment with GI ? ?

## 2021-05-29 NOTE — Assessment & Plan Note (Signed)
Stable, will monitor 

## 2021-06-09 ENCOUNTER — Other Ambulatory Visit: Payer: Self-pay | Admitting: Internal Medicine

## 2021-06-23 ENCOUNTER — Other Ambulatory Visit: Payer: Self-pay | Admitting: Family Medicine

## 2021-06-25 ENCOUNTER — Other Ambulatory Visit: Payer: Self-pay | Admitting: Physician Assistant

## 2021-06-25 MED ORDER — GABAPENTIN 300 MG PO CAPS
ORAL_CAPSULE | ORAL | 1 refills | Status: DC
Start: 2021-06-25 — End: 2022-03-20

## 2021-06-25 NOTE — Telephone Encounter (Signed)
[facility] is requesting to fill pt neurontin. Please advise KH ?

## 2021-06-26 ENCOUNTER — Other Ambulatory Visit (HOSPITAL_COMMUNITY): Payer: Self-pay

## 2021-06-26 NOTE — Progress Notes (Addendum)
[facility] Note ? ?07/06/2021 ? ?  ? ?CHIEF COMPLAINT ?Patient presents for Diabetic Eye Exam ? ? ?HISTORY OF PRESENT ILLNESS: ?Cassandra Stewart is a 71 y.o. female who presents to the clinic today for:  ? ?HPI   ? ? Diabetic Eye Exam   ?Vision is stable.  Diabetes characteristics include Type 2, on insulin and taking oral medications.  Blood sugar level fluctuates.  Last Blood Glucose 84.  Last A1C 8.5.  Associated Diagnosis Neuropathy.  I, the attending physician,  performed the HPI with the patient and updated documentation appropriately. ? ?  ?  ? ? Comments   ?1 year follow up DM II-  Eyes itch often.  She uses ATs but they don't help. Vision appears stable. ? ? ?  ?  ?Last edited by Bernarda Caffey, MD on 07/06/2021 12:45 PM.  ?  ?Pt reports issues with Dexcom meter ? ?Referring physician: ?Girtha Rm, NP-C ?[place],  [place] [address] ? ?HISTORICAL INFORMATION:  ? ?Selected notes from the [place] ?Referred by V. Raenette Rover, NP-C for DM exam ?LEE-  ?Ocular Hx- pseudophakia OU (2016, Dr. Candiss Norse at [facility]) ?PMH- DM2 on insulin, HTN,  ?  ? ?CURRENT MEDICATIONS: ?No current outpatient medications on file. (Ophthalmic Drugs)  ? ?No current facility-administered medications for this visit. (Ophthalmic Drugs)  ? ?Current Outpatient Medications (Other)  ?Medication Sig  ? amLODipine (NORVASC) 10 MG tablet Take 1 tablet (10 mg total) by mouth daily.  ? aspirin EC 81 MG tablet Take 81 mg by mouth every evening.  ? Cholecalciferol (VITAMIN D PO) Take 1,000 mg by mouth daily.   ? Cyanocobalamin (VITAMIN B 12 PO) Take 5,000 mg by mouth daily.  ? diclofenac (VOLTAREN) 75 MG EC tablet Take 1 tablet (75 mg total) by mouth daily as needed. For wrist pain/carpal tunnel syndrome  ? diclofenac Sodium (VOLTAREN) 1 % GEL Apply 4 g topically 4 (four) times daily.  ? dicyclomine (BENTYL) 10 MG capsule Take 1 capsule (10 mg total) by mouth every 8 (eight) hours as needed  for spasms.  ? Dulaglutide (TRULICITY) 3 0000000 SOPN Inject 3 mg into the skin once a week.  ? gabapentin (NEURONTIN) 300 MG capsule TAKE 1 CAPSULE(300 MG) BY MOUTH THREE TIMES DAILY  ? hydrochlorothiazide (MICROZIDE) 12.5 MG capsule Take 1 capsule (12.5 mg total) by mouth daily.  ? insulin lispro (HUMALOG) 100 UNIT/ML KwikPen INJECT 7-10 UNITS UNDER THE SKIN 2-3 TIMES DAILY  ? lovastatin (MEVACOR) 40 MG tablet Take 1 tablet (40 mg total) by mouth at bedtime.  ? metFORMIN (GLUCOPHAGE-XR) 500 MG 24 hr tablet TAKE 1 TABLET(500 MG) BY MOUTH TWICE DAILY  ? nitroGLYCERIN (NITROSTAT) 0.4 MG SL tablet Place 1 tablet (0.4 mg total) under the tongue every 5 (five) minutes as needed for chest pain.  ? polyethylene glycol (MIRALAX / GLYCOLAX) 17 g packet Take 17 g by mouth as needed.  ? TOUJEO SOLOSTAR 300 UNIT/ML Solostar Pen Inject 40 Units into the skin at bedtime.  ? traMADol (ULTRAM) 50 MG tablet Take 1 tablet (50 mg total) by mouth every 12 (twelve) hours as needed.  ? ACCU-CHEK GUIDE test strip USE AS DIRECTED TO TEST BLOOD SUGAR FOUR TIMES DAILY  ? acetaminophen (TYLENOL) 650 MG CR tablet Take 650 mg by mouth every 8 (eight) hours as needed for pain. (Patient not taking: Reported on 07/06/2021)  ? Continuous Blood Gluc Receiver ([place]) DEVI by  Does not apply route.  ? Insulin Pen Needle (CAREFINE PEN NEEDLES) 32G X 4 MM MISC Use 4x a day  ? pantoprazole (PROTONIX) 20 MG tablet Take 1 tablet (20 mg total) by mouth 2 (two) times daily.  ? ?No current facility-administered medications for this visit. (Other)  ? ?REVIEW OF SYSTEMS: ?ROS   ?Positive for: Musculoskeletal, Endocrine, Eyes ?Negative for: Constitutional, Gastrointestinal, Neurological, Skin, Genitourinary, HENT, Cardiovascular, Respiratory, Psychiatric, Allergic/Imm, Heme/Lymph ?Last edited by Leonie Douglas, [place] on 07/06/2021 12:17 PM.  ?  ? ?ALLERGIES ?Allergies  ?Allergen Reactions  ? Other   ?  PT PREFERS TO NOT HAVE ANY NARCOTIC MEDICATIONS  ?  Chlorhexidine Rash  ?  Burning after using CHG wipes-used for surgery  ? Oxycodone Hcl   ?  Other reaction(s): Hallucination ?Marked hallucinations and palpitations following dose of 10mg  on 04/30/2015 ?  ? ?PAST MEDICAL HISTORY ?Past Medical History:  ?Diagnosis Date  ? Elevated LFTs   ? Fatty liver   ? on [place].   ? Hyperlipemia   ? Hypertension   ? Inguinal hernia   ? 2020 CT  ? Morbid obesity ([place]) 07/04/2015  ? Stage 3b chronic kidney disease ([place])   ? Thoracic spondylosis with myelopathy   ? Umbilical hernia   ? 2020 CT  ? Uncontrolled type 2 diabetes with neuropathy   ? Wheelchair bound   ? paraplegia of Leg - Left  ? ?Past Surgical History:  ?Procedure Laterality Date  ? APPENDECTOMY  1969  ? BREAST BIOPSY Right   ? CATARACT EXTRACTION    ? May 21016  ? LUMBAR DISC SURGERY    ? LUMBAR LAMINECTOMY/DECOMPRESSION MICRODISCECTOMY N/A 07/06/2015  ? Procedure: Thoracic ten- eleven laminectomy for spinal canal decompression;  Surgeon: Ashok Pall, MD;  Location: [facility];  Service: Orthopedics;  Laterality: N/A;  ? ?FAMILY HISTORY ?Family History  ?Problem Relation Age of Onset  ? Diabetes Mother   ? Heart disease Mother   ? Pancreatic cancer Sister   ? Kidney disease Brother   ?     Two brothers on ESRD  ? Amblyopia Neg Hx   ? Blindness Neg Hx   ? Cataracts Neg Hx   ? Glaucoma Neg Hx   ? Macular degeneration Neg Hx   ? Retinal detachment Neg Hx   ? Strabismus Neg Hx   ? Retinitis pigmentosa Neg Hx   ? Colon cancer Neg Hx   ? Esophageal cancer Neg Hx   ? ?SOCIAL HISTORY ?Social History  ? ?Tobacco Use  ? Smoking status: Never  ? Smokeless tobacco: Never  ?Vaping Use  ? Vaping Use: Never used  ?Substance Use Topics  ? Alcohol use: No  ? Drug use: No  ?  ? ?  ?OPHTHALMIC EXAM: ? ?Base Eye Exam   ? ? Visual Acuity (Snellen - Linear)   ? ?   Right Left  ? Dist [place] 20/20 -2 20/20 -2  ? ?  ?  ? ? Tonometry (Tonopen, 12:22 PM)   ? ?   Right Left  ? Pressure 20 19  ? ?  ?  ? ? Pupils   ? ?   Dark Light Shape React APD  ? Right  2 1 Round Brisk None  ? Left 2 1 Round Brisk None  ? ?  ?  ? ? Visual Fields (Counting fingers)   ? ?   Left Right  ?  Full Full  ? ?  ?  ? ? Extraocular Movement   ? ?  Right Left  ?  Full Full  ? ?  ?  ? ? Neuro/Psych   ? ? Oriented x3: Yes  ? Mood/Affect: Normal  ? ?  ?  ? ? Dilation   ? ? Both eyes: 1.0% Mydriacyl, 2.5% Phenylephrine @ 12:22 PM  ? ?  ?  ? ?  ? ?Slit Lamp and Fundus Exam   ? ? External Exam   ? ?   Right Left  ? External Normal Normal  ? ?  ?  ? ? Slit Lamp Exam   ? ?   Right Left  ? Lids/Lashes Dermatochalasis - upper lid, mild Meibomian gland dysfunction Dermatochalasis - upper lid  ? Conjunctiva/Sclera White and quiet Trace Melanosis  ? Cornea 1+ Punctate epithelial erosions, mild arcus; Well healed temporal cataract wounds, mild tear film debris 1+ central Punctate epithelial erosions, Arcus, Well healed temporal cataract wounds, decreased TBUT  ? Anterior Chamber Deep and quiet Deep and quiet  ? Iris Round and well dilated, No NVI Round and dilated, No NVI  ? Lens PC IOL in good postion, trace inferior Posterior capsular opacification PC IOL in good position with open PC  ? Anterior Vitreous Vitreous syneresis, Posterior vitreous detachment Vitreous syneresis, Posterior vitreous detachment  ? ?  ?  ? ? Fundus Exam   ? ?   Right Left  ? Disc Pink and Sharp Mildly Tilted disc, Pink and Sharp  ? C/D Ratio 0.55 0.6  ? Macula Flat, Good foveal reflex, mild RPE mottling, No heme or edema Flat, Good foveal reflex, mild RPE mottling, rare MA, No edema  ? Vessels Mild Vascular attenuation Vascular attenuation, mild tortuousity  ? Periphery Attached; rare scattered MA/DBH mostly posterior Attached; rare scattered MA/DBH mostly posterior  ? ?  ?  ? ?  ? ?IMAGING AND PROCEDURES  ?Imaging and Procedures for 06/09/17 ? ?OCT, Retina - OU - Both Eyes   ? ?   ?Right Eye ?Quality was good. Central Foveal Thickness: 272. Progression has been stable. Findings include normal foveal contour, no SRF, no IRF,  myopic contour (Tr cystic changes inferior macula).  ? ?Left Eye ?Quality was good. Central Foveal Thickness: 281. Progression has been stable. Findings include normal foveal contour, no SRF, no IRF (No

## 2021-06-28 DIAGNOSIS — E1165 Type 2 diabetes mellitus with hyperglycemia: Secondary | ICD-10-CM | POA: Diagnosis not present

## 2021-07-06 ENCOUNTER — Encounter (INDEPENDENT_AMBULATORY_CARE_PROVIDER_SITE_OTHER): Payer: Self-pay | Admitting: Ophthalmology

## 2021-07-06 ENCOUNTER — Ambulatory Visit (INDEPENDENT_AMBULATORY_CARE_PROVIDER_SITE_OTHER): Payer: Medicare Other | Admitting: Ophthalmology

## 2021-07-06 DIAGNOSIS — Z961 Presence of intraocular lens: Secondary | ICD-10-CM | POA: Diagnosis not present

## 2021-07-06 DIAGNOSIS — H35049 Retinal micro-aneurysms, unspecified, unspecified eye: Secondary | ICD-10-CM

## 2021-07-06 DIAGNOSIS — E11319 Type 2 diabetes mellitus with unspecified diabetic retinopathy without macular edema: Secondary | ICD-10-CM

## 2021-07-06 DIAGNOSIS — H35033 Hypertensive retinopathy, bilateral: Secondary | ICD-10-CM

## 2021-07-06 DIAGNOSIS — I10 Essential (primary) hypertension: Secondary | ICD-10-CM

## 2021-07-06 DIAGNOSIS — H26492 Other secondary cataract, left eye: Secondary | ICD-10-CM | POA: Diagnosis not present

## 2021-07-06 DIAGNOSIS — E113293 Type 2 diabetes mellitus with mild nonproliferative diabetic retinopathy without macular edema, bilateral: Secondary | ICD-10-CM

## 2021-07-06 DIAGNOSIS — H43813 Vitreous degeneration, bilateral: Secondary | ICD-10-CM | POA: Diagnosis not present

## 2021-07-19 ENCOUNTER — Encounter: Payer: Self-pay | Admitting: Internal Medicine

## 2021-07-19 ENCOUNTER — Ambulatory Visit (INDEPENDENT_AMBULATORY_CARE_PROVIDER_SITE_OTHER): Payer: Medicare Other | Admitting: Internal Medicine

## 2021-07-19 ENCOUNTER — Telehealth: Payer: Self-pay | Admitting: Dietician

## 2021-07-19 ENCOUNTER — Other Ambulatory Visit: Payer: Self-pay | Admitting: Internal Medicine

## 2021-07-19 VITALS — BP 130/70 | HR 71 | Ht 64.5 in | Wt 215.2 lb

## 2021-07-19 DIAGNOSIS — E1165 Type 2 diabetes mellitus with hyperglycemia: Secondary | ICD-10-CM | POA: Diagnosis not present

## 2021-07-19 DIAGNOSIS — E782 Mixed hyperlipidemia: Secondary | ICD-10-CM

## 2021-07-19 DIAGNOSIS — E1142 Type 2 diabetes mellitus with diabetic polyneuropathy: Secondary | ICD-10-CM

## 2021-07-19 DIAGNOSIS — E538 Deficiency of other specified B group vitamins: Secondary | ICD-10-CM

## 2021-07-19 LAB — POCT GLYCOSYLATED HEMOGLOBIN (HGB A1C): Hemoglobin A1C: 7.7 % — AB (ref 4.0–5.6)

## 2021-07-19 NOTE — Patient Instructions (Addendum)
Please continue: - Metformin ER 500 mg 1-2x a day with meals - Trulicity 3 mg weekly - Toujeo 40 units at night - Humalog 12-16 units before the meals, and 7 units before the snack at night   Please continue B12 1000 mcg 2x a week.  Please have a B12 level checked at your appt. in August.  Please return in 4 months.

## 2021-07-19 NOTE — Telephone Encounter (Signed)
Patient is to change from the Dexcom G6 to the Dexcom G7.  She saw MD this am but I was unavailable to meet with her at that time.  Called patient.  She stated that her sensor will need to be changed on Saturday.  She has used her last [employer] transportation currently and is unable to make another in person visit. Reviewed DexcomG7 basics and instructed patient to watch the training videos on [email or web address].  She will use the Receiver. She is to call for any questions.  Number provided.  Oran Rein, RD, LDN, CDCES

## 2021-07-19 NOTE — Progress Notes (Signed)
Patient ID: Cassandra Stewart, female   DOB: Jul 20, 1950, 71 y.o.   MRN: [id number]   This visit occurred during the SARS-CoV-2 public health emergency.  Safety protocols were in place, including screening questions prior to the visit, additional usage of staff PPE, and extensive cleaning of exam room while observing appropriate contact time as indicated for disinfecting solutions.   HPI: Cassandra Stewart is a 71 y.o.-year-old female, returning for follow-up for DM2, dx in 1992, insulin-dependent since 2006, uncontrolled, with complications (CKD stage 3, PN, DR).  Last visit 3.5 months ago  Interim history: No increased urination, blurry vision, nausea. She continues to have neck and back pain.  She had wrist pain at last visit, but this improved.  She is wearing a brace today.   Reviewed HbA1c levels: Lab Results  Component Value Date   HGBA1C 8.5 (A) 04/09/2021   HGBA1C 7.1 (A) 12/06/2020   HGBA1C 8.5 (A) 08/04/2020   HGBA1C 9.9 (A) 04/06/2020   HGBA1C 7.4 (A) 12/02/2019   HGBA1C 9.3 (A) 07/29/2019   HGBA1C 9.6 (A) 03/30/2019   HGBA1C 9.8 (A) 12/28/2018   HGBA1C 10.7 (A) 07/13/2018   HGBA1C 6.8 (A) 03/16/2018   HGBA1C 6.1 (A) 11/12/2017   HGBA1C 9.0 07/11/2017   HGBA1C 9.0 04/14/2017   HGBA1C 8.7% 10/24/2016   HGBA1C 9.6% 07/25/2016   HGBA1C 10.5% 03/04/2016   HGBA1C 8.1% 10/25/2015   HGBA1C 9.7 (H) 07/04/2015  03/16/2018: HbA1c calculated from fructosamine 6.28%  Pt was on a regimen of: - Metformin 500 mg 1x a day with dinner.  She had diarrhea with a higher dose. - Toujeo 45 units in am - Humalog 18 units 2-3x a day, before meals Tried: Actos, Lantus.  Now on: - Metformin ER 500 mg 1-2x a day with meals. - Trulicity 1.5 >> 3 mg weekly - restarted 03/2021 - Toujeo 35 >> 28 >> 36 >> 40 units at night - Humalog 7-12 units before meals and 5 >>7 units before snack at night  At last visit she was not taking blood sugars that she did not have transmitters ([place] did not  send them).  Now taking >4 times a day with her Dexcom CGM:   Previously:   Lowest sugar was 67 >> 49 (took insulin but forgot to eat) >> 64; she has hypoglycemia awareness in the 80s. Highest sugar was 383 >> 143 >> 296 >> 269.  Glucometer: Freestyle  Pt's meals are: - Breakfast/brunch: egg, bacon, toast >> no b'fast  - Lunch: snack mostly (no Humalog) >> Meals on Wheels - Dinner: chicken/fish + vegetables >> moved before 7 pm - Snacks: Belvita cracker if hungry  -+ Stage III CKD; last BUN/creatinine:  Lab Results  Component Value Date   BUN 20 05/27/2021   BUN 20 11/28/2020   CREATININE 1.07 (H) 05/27/2021   CREATININE 1.10 (H) 11/28/2020   Lab Results  Component Value Date   GFRAA 54 (L) 03/02/2020   GFRAA 59 (L) 08/31/2019   GFRAA 58 (L) 03/24/2019   GFRAA 59 (L) 03/12/2019   GFRAA 55 (L) 03/08/2019  Previously on lisinopril 10, but stopped due to hyperkalemia.   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 Coralyn Pear).  She has history of  cataract surgery OU.  -+ Numbness and tingling in her fingers-on Neurontin 300 mg 3x a day per PCP.  Latest foot exam 03/2021. On ASA 81.  Low vitamin B12:  Reviewed B12 levels: Lab Results  Component Value Date   VITAMINB12 >1550 (H) 12/06/2020   VITAMINB12 >1526 (H) 07/29/2019   VITAMINB12 >1500 (H) 03/30/2019   VITAMINB12 >1500 (H) 12/28/2018   VITAMINB12 261 03/16/2018   VITAMINB12 300 04/14/2017   VITAMINB12 478 10/25/2015  05/01/2016: Vit B12 248.  We initially started 5000 mcg B12 daily, which was then decreased to 2500 mcg daily and, then to 1000 mcg daily >> every other day >> 2x a week - 11/2020.  She continues to be in a wheelchair as her left leg is very weak-believed to be from a  herniated intervertebral disc.  She had surgery for this in 2016 but the strength did not improve. She was in the emergency room on 10/24/2020 for R wrist pain and swelling.  She also sees Dr. Unk Lightning.  No fracture. Has a brace. Her ulnar nerve is affected by using her home wheelchair. She was in the emergency room 11/28/2020 for chest pain.  Troponins were negative.  D-dimer was still elevated.  She has an extensive FH of heart disease - women in her family. Daughter died after her heart stopped.  ROS: + See HPI Neurological: no tremors/+ numbness/+ tingling-fingers/no dizziness  I reviewed pt's medications, allergies, PMH, social hx, family hx, and changes were documented in the history of present illness. Otherwise, unchanged from my initial visit note.  Past Medical History:  Diagnosis Date   Elevated LFTs    Fatty liver    on [place].    Hyperlipemia    Hypertension    Inguinal hernia    2020 CT   Morbid obesity ([place]) 07/04/2015   Stage 3b chronic kidney disease ([place])    Thoracic spondylosis with myelopathy    Umbilical hernia    [id number] CT   Uncontrolled type 2 diabetes with neuropathy    Wheelchair bound    paraplegia of Leg - Left   Past Surgical History:  Procedure Laterality Date   APPENDECTOMY  1969   BREAST BIOPSY Right    CATARACT EXTRACTION     May 21016   LUMBAR [place]     LUMBAR LAMINECTOMY/DECOMPRESSION MICRODISCECTOMY N/A 07/06/2015   Procedure: Thoracic ten- eleven laminectomy for spinal canal decompression;  Surgeon: Ashok Pall, MD;  Location: [place] [facility];  Service: Orthopedics;  Laterality: N/A;   Social History   Social History   Marital status: Divorced    Spouse name: N/A   Number of children: 0   Occupational History   None   Social History Main Topics   Smoking status: Never Smoker   Smokeless tobacco: Never Used   Alcohol use No   Drug use: No   Current Outpatient Medications on File Prior to Visit  Medication Sig Dispense Refill    ACCU-CHEK GUIDE test strip USE AS DIRECTED TO TEST BLOOD SUGAR FOUR TIMES DAILY 400 strip 1   acetaminophen (TYLENOL) 650 MG CR tablet Take 650 mg by mouth every 8 (eight) hours as needed for pain. (Patient not taking: Reported on 07/06/2021)     amLODipine (NORVASC) 10 MG tablet Take 1 tablet (10 mg total) by mouth daily. 90 tablet 3   aspirin EC 81 MG tablet Take 81 mg by mouth every evening.     Cholecalciferol (VITAMIN D PO) Take 1,000 mg by mouth  daily.      Continuous Blood Gluc Receiver ([place]) DEVI by Does not apply route.     Cyanocobalamin (VITAMIN B 12 PO) Take 5,000 mg by mouth daily.     diclofenac (VOLTAREN) 75 MG EC tablet Take 1 tablet (75 mg total) by mouth daily as needed. For wrist pain/carpal tunnel syndrome 15 tablet 1   diclofenac Sodium (VOLTAREN) 1 % GEL Apply 4 g topically 4 (four) times daily. 100 g 0   dicyclomine (BENTYL) 10 MG capsule Take 1 capsule (10 mg total) by mouth every 8 (eight) hours as needed for spasms. 60 capsule 1   Dulaglutide (TRULICITY) 3 0000000 SOPN Inject 3 mg into the skin once a week. 6 mL 3   gabapentin (NEURONTIN) 300 MG capsule TAKE 1 CAPSULE(300 MG) BY MOUTH THREE TIMES DAILY 270 capsule 1   hydrochlorothiazide (MICROZIDE) 12.5 MG capsule Take 1 capsule (12.5 mg total) by mouth daily. 90 capsule 3   insulin lispro (HUMALOG) 100 UNIT/ML KwikPen INJECT 7-10 UNITS UNDER THE SKIN 2-3 TIMES DAILY 30 mL 3   Insulin Pen Needle (CAREFINE PEN NEEDLES) 32G X 4 MM MISC Use 4x a day 300 each 3   lovastatin (MEVACOR) 40 MG tablet Take 1 tablet (40 mg total) by mouth at bedtime. 90 tablet 3   metFORMIN (GLUCOPHAGE-XR) 500 MG 24 hr tablet TAKE 1 TABLET(500 MG) BY MOUTH TWICE DAILY 180 tablet 3   nitroGLYCERIN (NITROSTAT) 0.4 MG SL tablet Place 1 tablet (0.4 mg total) under the tongue every 5 (five) minutes as needed for chest pain. 25 tablet 3   pantoprazole (PROTONIX) 20 MG tablet Take 1 tablet (20 mg total) by mouth 2 (two) times daily. 60  tablet 3   polyethylene glycol (MIRALAX / GLYCOLAX) 17 g packet Take 17 g by mouth as needed.     TOUJEO SOLOSTAR 300 UNIT/ML Solostar Pen Inject 40 Units into the skin at bedtime. 12 mL 3   traMADol (ULTRAM) 50 MG tablet Take 1 tablet (50 mg total) by mouth every 12 (twelve) hours as needed. 60 tablet 2   No current facility-administered medications on file prior to visit.   Allergies  Allergen Reactions   Other     PT PREFERS TO NOT HAVE ANY NARCOTIC MEDICATIONS   Chlorhexidine Rash    Burning after using CHG wipes-used for surgery   Oxycodone Hcl     Other reaction(s): Hallucination Marked hallucinations and palpitations following dose of 10mg  on 04/30/2015    Family History  Problem Relation Age of Onset   Diabetes Mother    Heart disease Mother    Pancreatic cancer Sister    Kidney disease Brother        Two brothers on ESRD   Amblyopia Neg Hx    Blindness Neg Hx    Cataracts Neg Hx    Glaucoma Neg Hx    Macular degeneration Neg Hx    Retinal detachment Neg Hx    Strabismus Neg Hx    Retinitis pigmentosa Neg Hx    Colon cancer Neg Hx    Esophageal cancer Neg Hx    Pt has FH of DM in M, MGM, PGM, M aunt, uncles.  PE: BP 130/70 (BP Location: Left Arm, Patient Position: Sitting, Cuff Size: Normal)   Pulse 71   Ht 5' 4.5" (1.638 m)   Wt 215 lb 3.2 oz (97.6 kg)   SpO2 98%   BMI 36.37 kg/m  Wt Readings from Last 3 Encounters:  07/19/21 215 lb 3.2 oz (97.6 kg)  05/27/21 213 lb (96.6 kg)  04/20/21 216 lb (98 kg)   Constitutional: overweight, in NAD, in wheelchair Eyes: PERRLA, EOMI, no exophthalmos ENT: moist mucous membranes, no thyromegaly, no cervical lymphadenopathy Cardiovascular: RRR, No MRG, + L ankle edema - pitting after fall 2017 Respiratory: CTA B Musculoskeletal: no deformities, + paraplegia Skin: moist, warm, no rashes Neurological: no tremor with outstretched hands, DTR normal in all 4  ASSESSMENT: 1. DM2, insulin-dependent, uncontrolled, with  complications - CKD stage 3 - PN - DR  - Of note, we tried to prescribe the Freestyle libre CGM for her but this was not covered by the insurance  2. Low B12  3. HL  PLAN:  1. Patient with longstanding, uncontrolled, type 2 diabetes, on basal/bolus insulin regimen and also metformin and GLP-1 receptor agonist, with worsening control before last visit.  At that time an HbA1c returned 8.5%.  Reviewing her CGM trends it appears that her sugars are increasing after the first meal of the day, which was around 12 PM.  She was having a bagel for breakfast and we discussed about the fact that this is high glycemic index and I recommended healthier choices.  We also restarted her Trulicity  at that time (she was off due to availability at the pharmacy) and also the insulin before a snack. CGM interpretation: -At today's visit, we reviewed her CGM downloads: It appears that 70% of values are in target range (goal >70%), while 30% are higher than 180 (goal <25%), and 0% are lower than 70 (goal <4%).  The calculated average blood sugar is 156.  The projected HbA1c for the next 3 months (GMI) is 7%. -Reviewing the CGM trends, it appears that her sugars improved overnight especially in the last 2 weeks and also improved later in the day after starting Trulicity back at last visit.  However, patient mentions that she does miss Humalog doses and this is consistent with her sugars increasing after lunch and dinner.  We did discuss about possibly increasing the Trulicity dose, but she would like to hold off on this and I agree that trying to get her Humalog in before meals would be very important for now.  At next visit, if the sugars are not better, we can definitely try to increase Trulicity dose then. - I suggested to:  Patient Instructions  Please continue: - Metformin ER 500 mg 1-2x a day with meals - Trulicity 3 mg weekly - Toujeo 40 units at night - Humalog 12-16 units before the meals, and 7 units before  the snack at night   Please continue B12 1000 mcg 2x a week.  Please have a B12 level checked at your appt. in August.  Please return in 4 months.   - we checked her HbA1c: 7.7% (lower) - advised to check sugars at different times of the day - 4x a day, rotating check times - advised for yearly eye exams >> she is UTD - UTD with foot exam.  She has peripheral neuropathy for which she is on Neurontin-refills per PCP - return to clinic in 3-4 months  2. Low B12 -She has a history of low B12 for which we started supplementation with p.o. B12 vitamin -Previously on 1000 mg every other day but we will move this to twice a week after the latest results returned -She is due for another B12 level check -she has an annual physical exam with PCP in 09/2021.  I  advised her to try to have her B12 level drawn at that time.  I will put an order in the system but I am not sure if this can be seen by her PCPs office.  3. HL -Reviewed latest lipid panel from 11/2020: Fractions at goal: Lab Results  Component Value Date   CHOL 155 12/06/2020   HDL 68.10 12/06/2020   LDLCALC 58 12/06/2020   TRIG 145.0 12/06/2020   CHOLHDL 2 12/06/2020  -Continues on Mevacor 40 mg daily without side effects  Philemon Kingdom, MD PhD [facility]

## 2021-07-28 DIAGNOSIS — E1165 Type 2 diabetes mellitus with hyperglycemia: Secondary | ICD-10-CM | POA: Diagnosis not present

## 2021-07-30 ENCOUNTER — Encounter: Payer: Self-pay | Admitting: Physical Medicine and Rehabilitation

## 2021-07-30 ENCOUNTER — Encounter
Payer: Medicare Other | Attending: Physical Medicine and Rehabilitation | Admitting: Physical Medicine and Rehabilitation

## 2021-07-30 VITALS — BP 126/73 | HR 69 | Ht 64.5 in

## 2021-07-30 DIAGNOSIS — N1832 Chronic kidney disease, stage 3b: Secondary | ICD-10-CM

## 2021-07-30 DIAGNOSIS — M25531 Pain in right wrist: Secondary | ICD-10-CM | POA: Diagnosis not present

## 2021-07-30 DIAGNOSIS — M4804 Spinal stenosis, thoracic region: Secondary | ICD-10-CM | POA: Diagnosis not present

## 2021-07-30 DIAGNOSIS — G8220 Paraplegia, unspecified: Secondary | ICD-10-CM | POA: Diagnosis not present

## 2021-07-30 DIAGNOSIS — G8929 Other chronic pain: Secondary | ICD-10-CM | POA: Diagnosis not present

## 2021-07-30 NOTE — Progress Notes (Signed)
Subjective:    Patient ID: Cassandra Stewart, female    DOB: 1950-08-29, 71 y.o.   MRN: [id number]  HPI Pt is a 71 yr old R handed female  With hx of HTN, DM- poorly controlled per chart; obesity- BMI >35, HLD, Vit B levels are high- with hx of thoracic spondylosis/myelopathy and LE weakness- mainly LLE weak.  Here for f/u on weakness. w/c dependent. In power w/c.  Also new R wrist pain- ulnar styloid pain/TTP Here for f/u on Thoracic nontraumatic myelopathy Also has carpal tunnel syndrome on R.    Pain today is minimal- not unbearable- like can be lately.  Last few days- still having pain in neck/ and shoulder- thinks weather being good has helped pain a lot.   They can't get to her until July 10th- [employer] to move joystick to L.   R hand- uses warm compresses- helps when uses 2-3x/day-  Takes Tramadol- takes a couple times per week when gets really bad.  Only uses Diclofenac rarely- have taken total 3-4x.    Doing ok on increase in Gabapentin to 600 mg 2x/day- doesn't feel like we need to go up to TID.   No other big issues.   Trauma of toenail- it's gotten so thick- cannot cut with clippers- has appt with podiatry- to get toenail- and shave it down. Has appt Wednesday.   Pain Inventory Average Pain 5 Pain Right Now 5 My pain is  throbbing  LOCATION OF PAIN  neck, shoulder  BOWEL Number of stools per week: 3 Incontinent Yes   BLADDER Normal    Mobility use a wheelchair  Function disabled: date disabled .  Neuro/Psych numbness trouble walking  Prior Studies Any changes since last visit?  no  Physicians involved in your care Any changes since last visit?  no   Family History  Problem Relation Age of Onset   Diabetes Mother    Heart disease Mother    Pancreatic cancer Sister    Kidney disease Brother        Two brothers on ESRD   Amblyopia Neg Hx    Blindness Neg Hx    Cataracts Neg Hx    Glaucoma Neg Hx    Macular degeneration Neg Hx     Retinal detachment Neg Hx    Strabismus Neg Hx    Retinitis pigmentosa Neg Hx    Colon cancer Neg Hx    Esophageal cancer Neg Hx    Social History   Socioeconomic History   Marital status: Divorced    Spouse name: Not on file   Number of children: Not on file   Years of education: Not on file   Highest education level: Not on file  Occupational History   Not on file  Tobacco Use   Smoking status: Never   Smokeless tobacco: Never  Vaping Use   Vaping Use: Never used  Substance and Sexual Activity   Alcohol use: No   Drug use: No   Sexual activity: Not Currently  Other Topics Concern   Not on file  Social History Narrative   Not on file   Social Determinants of Health   Financial Resource Strain: Not on file  Food Insecurity: Not on file  Transportation Needs: Not on file  Physical Activity: Not on file  Stress: Not on file  Social Connections: Not on file   Past Surgical History:  Procedure Laterality Date   APPENDECTOMY  1969   BREAST BIOPSY Right  CATARACT EXTRACTION     May 21016   LUMBAR DISC SURGERY     LUMBAR LAMINECTOMY/DECOMPRESSION MICRODISCECTOMY N/A 07/06/2015   Procedure: Thoracic ten- eleven laminectomy for spinal canal decompression;  Surgeon: Ashok Pall, MD;  Location: [place] [facility];  Service: Orthopedics;  Laterality: N/A;   Past Medical History:  Diagnosis Date   Elevated LFTs    Fatty liver    on [place].    Hyperlipemia    Hypertension    Inguinal hernia    2020 CT   Morbid obesity ([place]) 07/04/2015   Stage 3b chronic kidney disease ([place])    Thoracic spondylosis with myelopathy    Umbilical hernia    [id number] CT   Uncontrolled type 2 diabetes with neuropathy    Wheelchair bound    paraplegia of Leg - Left   BP 126/73   Pulse 69   Ht 5' 4.5" (1.638 m)   SpO2 96%   BMI 36.37 kg/m   Opioid Risk Score:   Fall Risk Score:  `1  Depression screen [facility] 2/9     07/30/2021   11:11 AM 04/20/2021   11:28 AM 01/12/2021   10:42 AM 10/16/2020    11:45 AM 08/09/2020   11:19 AM 02/02/2020   11:01 AM 11/03/2019   10:43 AM  Depression screen PHQ 2/9  Decreased Interest 0 3 0 0 0 0 0  Down, Depressed, Hopeless 0 3 0 0 0 0 0  PHQ - 2 Score 0 6 0 0 0 0 0     Review of Systems  Constitutional: Negative.   HENT: Negative.    Eyes: Negative.   Respiratory: Negative.    Cardiovascular: Negative.   Gastrointestinal: Negative.   Endocrine: Negative.   Genitourinary: Negative.   Musculoskeletal:  Positive for gait problem.  Skin: Negative.   Allergic/Immunologic: Negative.   Neurological:  Positive for numbness.  Hematological: Negative.   Psychiatric/Behavioral: Negative.        Objective:   Physical Exam   Awake, alert, appropriate, wearing brace on R hand for CTS, NAD Joystick on w/c still on R      Assessment & Plan:   Pt is a 71 yr old R handed female  With hx of HTN, DM- poorly controlled per chart; obesity- BMI >35, HLD, Vit B levels are high- with hx of thoracic spondylosis/myelopathy and LE weakness- mainly LLE weak.  Here for f/u on weakness. w/c dependent. In power w/c.  Also new R wrist pain- ulnar styloid pain/TTP Here for f/u on Thoracic nontraumatic myelopathy Also has carpal tunnel syndrome on R.   Shoulders are OK- no trigger point injections,   2. Would rather her take Tramadol over the Diclofenac- but can have Diclofenac if really needed.    3. Has enough refills on tramadol and Diclofenac.   4. Called [employer] to discuss Moving joystick- wil see if they can get someone out earlier than originally planned- Per Deberah Pelton- ask for rehab side in future.    5. Tolerating Gabapentin- con't 600 mg BID- taking 300 mg tabs- can change in future if need be.   6. F/U in 3 months- double appt- SCI   I spent a total of  23  minutes on total care today- >50% coordination of care- due to calling [employer] and discussing pain control

## 2021-07-30 NOTE — Patient Instructions (Signed)
Pt is a 71 yr old R handed female  With hx of HTN, DM- poorly controlled per chart; obesity- BMI >35, HLD, Vit B levels are high- with hx of thoracic spondylosis/myelopathy and LE weakness- mainly LLE weak.  Here for f/u on weakness. w/c dependent. In power w/c.  Also new R wrist pain- ulnar styloid pain/TTP Here for f/u on Thoracic nontraumatic myelopathy Also has carpal tunnel syndrome on R.   Shoulders are OK- no trigger point injections,   2. Would rather her take Tramadol over the Diclofenac- but can have Diclofenac if really needed.    3. Has enough refills on tramadol and Diclofenac.   4. Called [employer] to discuss Moving joystick- wil see if they can get someone out earlier than originally planned- Per Harrie Jeans- ask for rehab side in future.    5. Tolerating Gabapentin- con't 600 mg BID- taking 300 mg tabs- can change in future if need be.   6. F/U in 3 months- double appt- SCI

## 2021-07-31 NOTE — Telephone Encounter (Signed)
Patient called as she has set up her new Dexcom G7 receiver but needs assistance in putting on the sensor. Referred her to a Dexcom video.  She will review and call me with any questions. She has used all of her medical transpiration trips and is unable to come to the office. She will call back if needed.  Also directed her to the Dexcom help line.  Antonieta Iba, RD, LDN, CDCES

## 2021-08-01 ENCOUNTER — Ambulatory Visit (INDEPENDENT_AMBULATORY_CARE_PROVIDER_SITE_OTHER): Payer: Medicare Other | Admitting: Podiatry

## 2021-08-01 ENCOUNTER — Encounter: Payer: Self-pay | Admitting: Podiatry

## 2021-08-01 DIAGNOSIS — B351 Tinea unguium: Secondary | ICD-10-CM

## 2021-08-01 DIAGNOSIS — Z794 Long term (current) use of insulin: Secondary | ICD-10-CM | POA: Diagnosis not present

## 2021-08-01 DIAGNOSIS — M79675 Pain in left toe(s): Secondary | ICD-10-CM

## 2021-08-01 DIAGNOSIS — M79674 Pain in right toe(s): Secondary | ICD-10-CM

## 2021-08-01 DIAGNOSIS — E1121 Type 2 diabetes mellitus with diabetic nephropathy: Secondary | ICD-10-CM | POA: Diagnosis not present

## 2021-08-02 ENCOUNTER — Ambulatory Visit: Payer: Medicare Other | Admitting: Podiatry

## 2021-08-03 NOTE — Progress Notes (Signed)
  Subjective:  Patient ID: Cassandra Stewart, female    DOB: 08-25-1950,  MRN: [id number]  Chief Complaint  Patient presents with   Diabetes    NAIL TRIM    71 y.o. female returns for the above complaint.  Patient presents with thickened elongated dystrophic toenails x10.  Mild on palpation.  Patient states that she did overdo herself.  She denies any other acute complaints.  She would like to have them debrided down.  She is a diabetic  Objective:  There were no vitals filed for this visit. Podiatric Exam: Vascular: dorsalis pedis and posterior tibial pulses are palpable bilateral. Capillary return is immediate. Temperature gradient is WNL. Skin turgor WNL  Sensorium: Normal Semmes Weinstein monofilament test. Normal tactile sensation bilaterally. Nail Exam: Pt has thick disfigured discolored nails with subungual debris noted bilateral entire nail hallux through fifth toenails.  Pain on palpation to the nails. Ulcer Exam: There is no evidence of ulcer or pre-ulcerative changes or infection. Orthopedic Exam: Muscle tone and strength are WNL. No limitations in general ROM. No crepitus or effusions noted.  Skin: No Porokeratosis. No infection or ulcers    Assessment & Plan:  No diagnosis found.  Patient was evaluated and treated and all questions answered.  Onychomycosis with pain  -Nails palliatively debrided as below. -Educated on self-care  Procedure: Nail Debridement Rationale: pain  Type of Debridement: manual, sharp debridement. Instrumentation: Nail nipper, rotary burr. Number of Nails: 10  Procedures and Treatment: Consent by patient was obtained for treatment procedures. The patient understood the discussion of treatment and procedures well. All questions were answered thoroughly reviewed. Debridement of mycotic and hypertrophic toenails, 1 through 5 bilateral and clearing of subungual debris. No ulceration, no infection noted.  Return Visit-Office Procedure: Patient  instructed to return to the office for a follow up visit 3 months for continued evaluation and treatment.  Nicholes Rough, DPM    Return in about 3 months (around 11/01/2021).

## 2021-08-20 ENCOUNTER — Encounter: Payer: Self-pay | Admitting: *Deleted

## 2021-08-24 ENCOUNTER — Encounter: Payer: Self-pay | Admitting: *Deleted

## 2021-08-24 NOTE — Progress Notes (Signed)
[facility] Quality Team Note  Name: Cassandra Stewart Date of Birth: 1950-09-18 MRN: [id number] Date: 08/24/2021  [facility] Quality Team has reviewed this patient's chart, please see recommendations below:  [facility] Quality Other; (Pt called back today 08/24/2021.  She declines scheduling mammogram and colonoscopy/cologuard/ FOBT.  See previous documentation note from 08/20/2021. Unable to addend previous note.)

## 2021-08-31 ENCOUNTER — Telehealth: Payer: Self-pay

## 2021-08-31 NOTE — Telephone Encounter (Signed)
Inbound email from DME supplier requesting form be completed and faxed with clinical notes. DME supplies ordered via [employer] through online portal.  

## 2021-09-12 DIAGNOSIS — E118 Type 2 diabetes mellitus with unspecified complications: Secondary | ICD-10-CM | POA: Diagnosis not present

## 2021-09-12 DIAGNOSIS — Z794 Long term (current) use of insulin: Secondary | ICD-10-CM | POA: Diagnosis not present

## 2021-09-14 ENCOUNTER — Other Ambulatory Visit: Payer: Self-pay | Admitting: Internal Medicine

## 2021-09-27 ENCOUNTER — Other Ambulatory Visit: Payer: Self-pay | Admitting: Internal Medicine

## 2021-09-27 DIAGNOSIS — E1142 Type 2 diabetes mellitus with diabetic polyneuropathy: Secondary | ICD-10-CM

## 2021-10-12 DIAGNOSIS — Z794 Long term (current) use of insulin: Secondary | ICD-10-CM | POA: Diagnosis not present

## 2021-10-12 DIAGNOSIS — E118 Type 2 diabetes mellitus with unspecified complications: Secondary | ICD-10-CM | POA: Diagnosis not present

## 2021-10-15 ENCOUNTER — Ambulatory Visit (INDEPENDENT_AMBULATORY_CARE_PROVIDER_SITE_OTHER): Payer: Medicare Other | Admitting: Medical

## 2021-10-15 ENCOUNTER — Encounter: Payer: Self-pay | Admitting: Medical

## 2021-10-15 ENCOUNTER — Encounter: Payer: Medicare Other | Admitting: Physician Assistant

## 2021-10-15 VITALS — BP 120/70 | HR 70 | Wt 216.0 lb

## 2021-10-15 DIAGNOSIS — R262 Difficulty in walking, not elsewhere classified: Secondary | ICD-10-CM

## 2021-10-15 DIAGNOSIS — G479 Sleep disorder, unspecified: Secondary | ICD-10-CM | POA: Diagnosis not present

## 2021-10-15 DIAGNOSIS — K760 Fatty (change of) liver, not elsewhere classified: Secondary | ICD-10-CM

## 2021-10-15 DIAGNOSIS — I700 Atherosclerosis of aorta: Secondary | ICD-10-CM

## 2021-10-15 DIAGNOSIS — I10 Essential (primary) hypertension: Secondary | ICD-10-CM

## 2021-10-15 DIAGNOSIS — E1169 Type 2 diabetes mellitus with other specified complication: Secondary | ICD-10-CM | POA: Diagnosis not present

## 2021-10-15 DIAGNOSIS — R0683 Snoring: Secondary | ICD-10-CM

## 2021-10-15 DIAGNOSIS — R29898 Other symptoms and signs involving the musculoskeletal system: Secondary | ICD-10-CM | POA: Diagnosis not present

## 2021-10-15 DIAGNOSIS — N1832 Chronic kidney disease, stage 3b: Secondary | ICD-10-CM

## 2021-10-15 DIAGNOSIS — Z993 Dependence on wheelchair: Secondary | ICD-10-CM

## 2021-10-15 DIAGNOSIS — R0989 Other specified symptoms and signs involving the circulatory and respiratory systems: Secondary | ICD-10-CM | POA: Diagnosis not present

## 2021-10-15 DIAGNOSIS — G8220 Paraplegia, unspecified: Secondary | ICD-10-CM

## 2021-10-15 DIAGNOSIS — E785 Hyperlipidemia, unspecified: Secondary | ICD-10-CM | POA: Diagnosis not present

## 2021-10-15 DIAGNOSIS — E1121 Type 2 diabetes mellitus with diabetic nephropathy: Secondary | ICD-10-CM

## 2021-10-15 DIAGNOSIS — Z794 Long term (current) use of insulin: Secondary | ICD-10-CM

## 2021-10-15 NOTE — Progress Notes (Signed)
Subjective: Chief Complaint  Patient presents with   med check    Med check, no concerns, declines flu shot    This is my first visit with her today.  She was seeing Burnard Hawthorne PA and Hetty Blend, NP prior to them leaving.  Medical team: Dr. Carlus Pavlov, endocrinology Dr. Nicholes Rough, podiatry Dr. Genice Rouge, physical medicine Dr. Rennis Chris, retina specialist Dr. Aldean Baker, orthopedics Dr. Zoila Shutter, cardiology Dr. Ileene Patrick, gastroenterology  She has a medical history significant for diabetes type 2, hypertension, hyperlipidemia, fatty liver disease, CKD, prior spinal cord injury and neuropathy, history of back surgery, paraplegia of the left leg, wheelchair-bound, atherosclerosis of the aorta, obesity, GERD.  Concerns: Here for med check today.  No specific concerns initially.  Hypertension-compliant with medication.  Does not check blood pressures at home.  No chest pain or edema or dyspnea  Diabetes-managed by endocrinology.  She is compliant with Toujeo 40 units daily, metformin XR 500 mg twice daily, Trulicity weekly pen and Humalog insulin at mealtime.  She uses a Dexcom to help track her sugars and guide insulin dosing.  No foot lesions.  Hyperlipidemia-compliant with statin.  No complaints.  She takes gabapentin for neuropathy.  She sees a pain specialist.  She notes given her history of spinal cord injury and the lack of any utility of the left leg and wheelchair-bound she worries about blood clots.  She has no current pain or swelling.  No history of blood clot  She does not recall any prior shingles vaccine and has not had a tetanus vaccine in a long time.  She declines pneumococcal vaccine today  She lives alone.  She rides the [employer] bus to and from appointments  Past Medical History:  Diagnosis Date   Elevated LFTs    Fatty liver    on [place].    Hyperlipemia    Hypertension    Inguinal hernia    2020 CT   Morbid obesity (HCC)  07/04/2015   Stage 3b chronic kidney disease (HCC)    Thoracic spondylosis with myelopathy    Umbilical hernia    2020 CT   Uncontrolled type 2 diabetes with neuropathy    Wheelchair bound    paraplegia of Leg - Left   Current Outpatient Medications on File Prior to Visit  Medication Sig Dispense Refill   ACCU-CHEK GUIDE test strip USE AS DIRECTED TO TEST BLOOD SUGAR FOUR TIMES DAILY 400 strip 1   acetaminophen (TYLENOL) 650 MG CR tablet Take 650 mg by mouth every 8 (eight) hours as needed for pain.     amLODipine (NORVASC) 10 MG tablet Take 1 tablet (10 mg total) by mouth daily. 90 tablet 3   aspirin EC 81 MG tablet Take 81 mg by mouth every evening.     Cholecalciferol (VITAMIN D PO) Take 1,000 mg by mouth daily.      Continuous Blood Gluc Receiver (DEXCOM G6 RECEIVER) DEVI by Does not apply route.     Cyanocobalamin (VITAMIN B 12 PO) Take 5,000 mg by mouth daily.     diclofenac Sodium (VOLTAREN) 1 % GEL Apply 4 g topically 4 (four) times daily. 100 g 0   dicyclomine (BENTYL) 10 MG capsule Take 1 capsule (10 mg total) by mouth every 8 (eight) hours as needed for spasms. 60 capsule 1   Dulaglutide (TRULICITY) 3 MG/0.5ML SOPN Inject 3 mg into the skin once a week. 6 mL 3   gabapentin (NEURONTIN) 300 MG capsule TAKE 1  CAPSULE(300 MG) BY MOUTH THREE TIMES DAILY 270 capsule 1   hydrochlorothiazide (MICROZIDE) 12.5 MG capsule Take 1 capsule (12.5 mg total) by mouth daily. 90 capsule 3   insulin lispro (HUMALOG) 100 UNIT/ML KwikPen INJECT 7-10 UNITS UNDER THE SKIN 2-3 TIMES DAILY 30 mL 3   lovastatin (MEVACOR) 40 MG tablet Take 1 tablet (40 mg total) by mouth at bedtime. 90 tablet 3   metFORMIN (GLUCOPHAGE-XR) 500 MG 24 hr tablet TAKE 1 TABLET(500 MG) BY MOUTH TWICE DAILY 180 tablet 3   polyethylene glycol (MIRALAX / GLYCOLAX) 17 g packet Take 17 g by mouth as needed.     TOUJEO SOLOSTAR 300 UNIT/ML Solostar Pen ADMINISTER 40 UNITS UNDER THE SKIN AT BEDTIME 12 mL 3   traMADol (ULTRAM) 50 MG  tablet Take 1 tablet (50 mg total) by mouth every 12 (twelve) hours as needed. 60 tablet 2   Insulin Pen Needle (CAREFINE PEN NEEDLES) 32G X 4 MM MISC Use 4x a day 300 each 3   nitroGLYCERIN (NITROSTAT) 0.4 MG SL tablet Place 1 tablet (0.4 mg total) under the tongue every 5 (five) minutes as needed for chest pain. 25 tablet 3   No current facility-administered medications on file prior to visit.   Past Surgical History:  Procedure Laterality Date   APPENDECTOMY  1969   BREAST BIOPSY Right    CATARACT EXTRACTION     May 81017   LUMBAR DISC SURGERY     LUMBAR LAMINECTOMY/DECOMPRESSION MICRODISCECTOMY N/A 07/06/2015   Procedure: Thoracic ten- eleven laminectomy for spinal canal decompression;  Surgeon: Coletta Memos, MD;  Location: [facility];  Service: Orthopedics;  Laterality: N/A;   ROS as in subjective     Objective: BP 120/70   Pulse 70   Wt 216 lb (98 kg) Comment: Endo office  BMI 36.50 kg/m   Gen: wd, wn, nad Heart regular rate rhythm, normal S1-S2, no murmurs Lungs clear, no wheezes or abnormal findings 1+ pedal pulses, decreased No extremity edema Left leg strength is minimal 3-4/5 strength, but right leg strength is normal, no asymmetry or swelling of the lower leg or upper leg.    Assessment: Encounter Diagnoses  Name Primary?   Essential hypertension Yes   Stage 3b chronic kidney disease (HCC)    Hyperlipidemia associated with type 2 diabetes mellitus (HCC)    Decreased pedal pulses    Type 2 diabetes mellitus with diabetic nephropathy, with long-term current use of insulin (HCC)    Wheelchair dependence    Weakness of left leg    Snoring    Sleep disturbance    Paraplegia (HCC)    Inability to walk    Fatty liver disease, nonalcoholic    Atherosclerosis of aorta (HCC)      Plan: Hypertension Continue amlodipine 10 mg daily Continue hydrochlorothiazide 12.5 mg daily Blood pressure under control Limit salt  High cholesterol associated with  diabetes Updated labs today  continue lovastatin 40 mg daily  Diabetes Managed by endocrinology, using Dexcom Continue weekly Trulicity, nightly Toujeo 40 units, metformin XR 500 mg twice daily and Humalog mealtime insulin Advised daily foot checks  Atherosclerosis of aorta Continue lovastatin 40 mg daily and low-cholesterol diet  Decreased pulses in the feet Referral placed today for ABI screening  Vaccine counseling You declined pneumococcal vaccine today I recommend an updated pneumococcal 20 Prevnar 20 vaccine, I recommend shingles and tetanus vaccine.  You can get the shingles and tetanus vaccine and possibly the pneumonia vaccine at your local pharmacy.  Have them send [place] a copy of the vaccine information I recommend a flu shot in the fall  Fatty liver disease I recommend low-fat diet limiting calories to 1800 cal/day to help with weight loss, and to help reduce the long-term complications of thyroid disease  Immobility, wheelchair-bound, paraplegia of the left leg, prior spinal injuries and chronic back pain I advised that there is no exam findings today suggestive of DVT.  We discussed the need to have her checked for her skin changes regularly, try to get some changes of position in the bed or chair regularly throughout the day to avoid ulcers.   Snoring I did review back in year 2021 visit with cardiology.  At that time she was recommended to have a sleep study to evaluate for sleep apnea. If you want to move forward with this, we can get this set up.  Let me know    Ekta was seen today for med check.  Diagnoses and all orders for this visit:  Essential hypertension  Stage 3b chronic kidney disease ([place]) -     Comprehensive metabolic panel -     VAS [place] ABI WITH/WO TBI; Future  Hyperlipidemia associated with type 2 diabetes mellitus (HCC) -     Lipid panel  Decreased pedal pulses -     VAS [place] ABI WITH/WO TBI; Future  Type 2 diabetes mellitus with diabetic  nephropathy, with long-term current use of insulin (HCC) -     VAS [place] ABI WITH/WO TBI; Future  Wheelchair dependence  Weakness of left leg  Snoring  Sleep disturbance  Paraplegia (HCC)  Inability to walk  Fatty liver disease, nonalcoholic  Atherosclerosis of aorta ([place])   F/u pending studies

## 2021-10-15 NOTE — Patient Instructions (Signed)
Hypertension Continue amlodipine 10 mg daily Continue hydrochlorothiazide 12.5 mg daily Blood pressure under control Limit salt  High cholesterol associated with diabetes Updated labs today  continue lovastatin 40 mg daily  Diabetes Managed by endocrinology, using Dexcom Continue weekly Trulicity, nightly Toujeo 40 units, metformin XR 500 mg twice daily and Humalog mealtime insulin Advised daily foot checks  Atherosclerosis of aorta Continue lovastatin 40 mg daily and low-cholesterol diet  Decreased pulses in the feet Referral placed today for ABI screening  Vaccine counseling You declined pneumococcal vaccine today I recommend an updated pneumococcal 20 Prevnar 20 vaccine, I recommend shingles and tetanus vaccine.  You can get the shingles and tetanus vaccine and possibly the pneumonia vaccine at your local pharmacy.  Have them send [place] a copy of the vaccine information I recommend a flu shot in the fall  Fatty liver disease I recommend low-fat diet limiting calories to 1800 cal/day to help with weight loss, and to help reduce the long-term complications of thyroid disease  Immobility, wheelchair-bound, paraplegia of the left leg, prior spinal injuries and chronic back pain I advised that there is no exam findings today suggestive of DVT.  We discussed the need to have her checked for her skin changes regularly, try to get some changes of position in the bed or chair regularly throughout the day to avoid ulcers.   Snoring I did review back in year 2021 visit with cardiology.  At that time she was recommended to have a sleep study to evaluate for sleep apnea. If you want to move forward with this, we can get this set up.  Let me know

## 2021-10-16 LAB — LIPID PANEL
Chol/HDL Ratio: 2.2 ratio (ref 0.0–4.4)
Cholesterol, Total: 158 mg/dL (ref 100–199)
HDL: 72 mg/dL (ref >39)
LDL Chol Calc (NIH): 62 mg/dL (ref 0–99)
Triglycerides: 143 mg/dL (ref 0–149)
VLDL Cholesterol Cal: 24 mg/dL (ref 5–40)

## 2021-10-16 LAB — COMPREHENSIVE METABOLIC PANEL
ALT: 25 IU/L (ref 0–32)
AST: 25 IU/L (ref 0–40)
Albumin/Globulin Ratio: 1.6 (ref 1.2–2.2)
Albumin: 4.4 g/dL (ref 3.8–4.8)
Alkaline Phosphatase: 87 IU/L (ref 44–121)
BUN/Creatinine Ratio: 18 (ref 12–28)
BUN: 18 mg/dL (ref 8–27)
Bilirubin Total: 0.3 mg/dL (ref 0.0–1.2)
CO2: 22 mmol/L (ref 20–29)
Calcium: 10 mg/dL (ref 8.7–10.3)
Chloride: 103 mmol/L (ref 96–106)
Creatinine, Ser: 1 mg/dL (ref 0.57–1.00)
Globulin, Total: 2.8 g/dL (ref 1.5–4.5)
Glucose: 146 mg/dL — ABNORMAL HIGH (ref 70–99)
Potassium: 5.2 mmol/L (ref 3.5–5.2)
Sodium: 144 mmol/L (ref 134–144)
Total Protein: 7.2 g/dL (ref 6.0–8.5)
eGFR: 59 mL/min/{1.73_m2} — ABNORMAL LOW (ref >59)

## 2021-10-23 ENCOUNTER — Encounter (HOSPITAL_COMMUNITY): Payer: Medicare Other

## 2021-10-30 ENCOUNTER — Telehealth: Payer: Self-pay | Admitting: *Deleted

## 2021-10-30 NOTE — Patient Outreach (Signed)
  Care Coordination   Initial Visit Note   10/30/2021 Name: Cassandra Stewart MRN: [id number] DOB: 12-26-1950  Cassandra Stewart is a 71 y.o. year old female who sees Jake Shark, [place] for primary care. I spoke with  Tressie Stalker by phone today.  What matters to the patients health and wellness today?  No concerns expressedd.    Goals Addressed             This Visit's Progress    Advised patient to contact PCP office and follow up with AWV and vaccines            SDOH assessments and interventions completed:  Yes     Care Coordination Interventions Activated:  Yes  Care Coordination Interventions:  Yes, provided   Follow up plan: No further intervention required.   Encounter Outcome:  Pt. Refused   Gean Maidens BSN RN [facility] [phone]

## 2021-11-09 ENCOUNTER — Ambulatory Visit: Payer: Medicare Other | Admitting: Podiatry

## 2021-11-11 DIAGNOSIS — Z794 Long term (current) use of insulin: Secondary | ICD-10-CM | POA: Diagnosis not present

## 2021-11-11 DIAGNOSIS — E118 Type 2 diabetes mellitus with unspecified complications: Secondary | ICD-10-CM | POA: Diagnosis not present

## 2021-11-12 ENCOUNTER — Telehealth: Payer: Self-pay | Admitting: Medical

## 2021-11-12 NOTE — Telephone Encounter (Signed)
Received a call from a [place] stating pt had called and her wheelchair is not rolling and she needs it repaired. Rep states that a prior auth would need to be completed and this number needed to be called [phone]) and a PA can be initiated.

## 2021-11-12 NOTE — Telephone Encounter (Signed)
I have faxed over [employer] order to repair wheelchair

## 2021-11-12 NOTE — Telephone Encounter (Signed)
Pt. Called stating she has been dealing with [employer] and her ins. [place] since 11/01/21 about her motorized wheel chair. She said her wheel chair is making a clicking sound and she is scared it is going to stop working. She will be stuck if the wheel chair stops working. It is very important and urgent that she gets this straight with her ins. Company asap. She said the ins. Needs approval from [place] to get the wheel chair fixed and she needs that sent to [employer]. She said [facility] needed a PA done to cover the wheel chair parts. She is going to call [employer] so they can fax the approval form for the wheelchair. She said they already faxed it on 11/07/21.

## 2021-11-26 ENCOUNTER — Encounter: Payer: Self-pay | Admitting: Physical Medicine and Rehabilitation

## 2021-11-26 ENCOUNTER — Encounter
Payer: Medicare Other | Attending: Physical Medicine and Rehabilitation | Admitting: Physical Medicine and Rehabilitation

## 2021-11-26 VITALS — BP 164/80 | HR 69 | Ht 64.5 in

## 2021-11-26 DIAGNOSIS — Z993 Dependence on wheelchair: Secondary | ICD-10-CM

## 2021-11-26 DIAGNOSIS — M4804 Spinal stenosis, thoracic region: Secondary | ICD-10-CM

## 2021-11-26 DIAGNOSIS — G8222 Paraplegia, incomplete: Secondary | ICD-10-CM | POA: Diagnosis not present

## 2021-11-26 DIAGNOSIS — M7918 Myalgia, other site: Secondary | ICD-10-CM

## 2021-11-26 NOTE — Patient Instructions (Signed)
Pt is a 72 yr old R handed female  With hx of HTN, DM- poorly controlled per chart; obesity- BMI >35, HLD, Vit B levels are high- with hx of thoracic spondylosis/myelopathy and LE weakness- mainly LLE weak.  Here for f/u on weakness. w/c dependent. In power w/c.  Also new R wrist pain- ulnar styloid pain/TTP Here for f/u on Thoracic nontraumatic myelopathy Also has carpal tunnel syndrome on R.  Has Quantum J4- myofascial pain causing upper back pain.   Deberah Pelton [phone] [employer] rep. - called [employer] to help with w/c issues.   2. Get a tennis ball - 2-4 minutes- on each spot- hold pressure- the bigger the muscle, the more time. - can tape 2 tennis balls together and lay on them on your neck can really relax the muscles- pressure not massage.   3. Hold stretches at least 1 minute- esp stretching back.    4. Myofascial pain- The store- [employer]- muscle hook- lime green- and can also use ot hold pressure in neck and upper/mid back-   5. Look into any other insurance other than united-  That's my stumbling block  6. F/U in 3 months- double appt- SCI  7. Not interested in SCI support group

## 2021-11-26 NOTE — Progress Notes (Signed)
Subjective:    Patient ID: Cassandra Stewart, female    DOB: 01-28-1951, 71 y.o.   MRN: [id number]  HPI Pt is a 70 yr old R handed female  With hx of HTN, DM- poorly controlled per chart; obesity- BMI >35, HLD, Vit B levels are high- with hx of thoracic spondylosis/myelopathy and LE weakness- mainly LLE weak.  Here for f/u on weakness. w/c dependent. In power w/c.  Also new R wrist pain- ulnar styloid pain/TTP Here for f/u on Thoracic nontraumatic myelopathy- incomplete paraplegia.  Also has carpal tunnel syndrome on R.   W/C- had a little over 1 year.  Parts are starting to go "bad".  Something wrong on tires/bearings.  Asked in June for servicing of w/c Paid July off August started hearing noise- clicking sound.  Ordering parts- called 9/7- waiting for parts still 9/13- haven't ordered parts, hasn't gotten approval from insurance- was waiting for [place] [employer] for approve.   Called Primary doctor approved it verbally- and then by fax- has to pay copay-  $150 for copay.  Finance coordinator-  set up on payment plan-  Now still waiting for parts and noise getting louder.    Afraid that will break w/c and will stuck anywhere.  Cannot get to grocery store, even.    Pain in upper back- from neck to shoulder blades- middle of them.    Pain Inventory Average Pain 5 Pain Right Now 4 My pain is dull and aching  LOCATION OF PAIN  Neck and back   BOWEL Number of stools per week: 2   BLADDER Normal   Mobility ability to climb steps?  no do you drive?  no use a wheelchair  Function not employed: date last employed .  Neuro/Psych numbness trouble walking  Prior Studies Any changes since last visit?  no  Physicians involved in your care Any changes since last visit?  no   Family History  Problem Relation Age of Onset   Diabetes Mother    Heart disease Mother    Pancreatic cancer Sister    Kidney disease Brother        Two brothers on ESRD   Amblyopia  Neg Hx    Blindness Neg Hx    Cataracts Neg Hx    Glaucoma Neg Hx    Macular degeneration Neg Hx    Retinal detachment Neg Hx    Strabismus Neg Hx    Retinitis pigmentosa Neg Hx    Colon cancer Neg Hx    Esophageal cancer Neg Hx    Social History   Socioeconomic History   Marital status: Divorced    Spouse name: Not on file   Number of children: Not on file   Years of education: Not on file   Highest education level: Not on file  Occupational History   Not on file  Tobacco Use   Smoking status: Never   Smokeless tobacco: Never  Vaping Use   Vaping Use: Never used  Substance and Sexual Activity   Alcohol use: No   Drug use: No   Sexual activity: Not Currently  Other Topics Concern   Not on file  Social History Narrative   Not on file   Social Determinants of Health   Financial Resource Strain: Not on file  Food Insecurity: Not on file  Transportation Needs: Not on file  Physical Activity: Not on file  Stress: Not on file  Social Connections: Not on file   Past Surgical History:  Procedure  Laterality Date   APPENDECTOMY  1969   BREAST BIOPSY Right    CATARACT EXTRACTION     May 21016   LUMBAR [place] SURGERY     LUMBAR LAMINECTOMY/DECOMPRESSION MICRODISCECTOMY N/A 07/06/2015   Procedure: Thoracic ten- eleven laminectomy for spinal canal decompression;  Surgeon: Ashok Pall, MD;  Location: [place] [facility];  Service: Orthopedics;  Laterality: N/A;   Past Medical History:  Diagnosis Date   Elevated LFTs    Fatty liver    on [place].    Hyperlipemia    Hypertension    Inguinal hernia    2020 CT   Morbid obesity ([place]) 07/04/2015   Stage 3b chronic kidney disease ([place])    Thoracic spondylosis with myelopathy    Umbilical hernia    2956 CT   Uncontrolled type 2 diabetes with neuropathy    Wheelchair bound    paraplegia of Leg - Left   BP (!) 164/80   Pulse 69   Ht 5' 4.5" (1.638 m)   SpO2 97%   BMI 36.50 kg/m   Opioid Risk Score:   Fall Risk Score:   `1  Depression screen [facility] 2/9     10/15/2021   10:34 AM 07/30/2021   11:11 AM 04/20/2021   11:28 AM 01/12/2021   10:42 AM 10/16/2020   11:45 AM 08/09/2020   11:19 AM 02/02/2020   11:01 AM  Depression screen PHQ 2/9  Decreased Interest 0 0 3 0 0 0 0  Down, Depressed, Hopeless 0 0 3 0 0 0 0  PHQ - 2 Score 0 0 6 0 0 0 0      Review of Systems  Musculoskeletal:  Positive for back pain and neck pain.  All other systems reviewed and are negative.     Objective:   Physical Exam  Awake, alert, appropriate, a little anxious today, NAD In J4 Quantum power w/c- level II not level 3 Trigger points in scalenes, upper traps, levators and rhomboids B/L -        Assessment & Plan:   Pt is a 71 yr old R handed female  With hx of HTN, DM- poorly controlled per chart; obesity- BMI >35, HLD, Vit B levels are high- with hx of thoracic spondylosis/myelopathy and LE weakness- mainly LLE weak.  Here for f/u on weakness. w/c dependent. In power w/c.  Also new R wrist pain- ulnar styloid pain/TTP Here for f/u on Thoracic nontraumatic myelopathy Also has carpal tunnel syndrome on R.  Has Quantum J4- myofascial pain causing upper back pain.   Deberah Pelton [phone] [employer] rep. - called [employer] to help with w/c issues.   2. Get a tennis ball - 2-4 minutes- on each spot- hold pressure- the bigger the muscle, the more time. - can tape 2 tennis balls together and lay on them on your neck can really relax the muscles- pressure not massage.   3. Hold stretches at least 1 minute- esp stretching back.    4. Myofascial pain- The store- [employer]- muscle hook- lime green- and can also use ot hold pressure in neck and upper/mid back-   5. Look into any other insurance other than united-  That's my stumbling block  6. F/U in 3 months- double appt- SCI  7. Not interested in SCI support group  I spent a total of  30  minutes on total care today- >50% coordination of care- due to d/w [employer] and pt  about w/c issues and myofascial pain- discussing options

## 2021-11-28 ENCOUNTER — Ambulatory Visit (INDEPENDENT_AMBULATORY_CARE_PROVIDER_SITE_OTHER): Payer: Medicare Other | Admitting: Internal Medicine

## 2021-11-28 ENCOUNTER — Encounter: Payer: Self-pay | Admitting: Internal Medicine

## 2021-11-28 VITALS — BP 122/58 | HR 69 | Ht 64.5 in

## 2021-11-28 DIAGNOSIS — E1165 Type 2 diabetes mellitus with hyperglycemia: Secondary | ICD-10-CM

## 2021-11-28 DIAGNOSIS — E538 Deficiency of other specified B group vitamins: Secondary | ICD-10-CM | POA: Diagnosis not present

## 2021-11-28 DIAGNOSIS — E782 Mixed hyperlipidemia: Secondary | ICD-10-CM

## 2021-11-28 DIAGNOSIS — E1142 Type 2 diabetes mellitus with diabetic polyneuropathy: Secondary | ICD-10-CM | POA: Diagnosis not present

## 2021-11-28 LAB — POCT GLYCOSYLATED HEMOGLOBIN (HGB A1C): Hemoglobin A1C: 7.3 % — AB (ref 4.0–5.6)

## 2021-11-28 LAB — VITAMIN B12: Vitamin B-12: >1500 pg/mL — ABNORMAL HIGH (ref 211–911)

## 2021-11-28 MED ORDER — TRULICITY 3 MG/0.5ML ~~LOC~~ SOAJ: 3.0000 mg | SUBCUTANEOUS | 3 refills | Status: DC

## 2021-11-28 MED ORDER — TOUJEO SOLOSTAR 300 UNIT/ML ~~LOC~~ SOPN: PEN_INJECTOR | SUBCUTANEOUS | 3 refills | Status: DC

## 2021-11-28 NOTE — Progress Notes (Addendum)
Patient ID: Cassandra Stewart, female   DOB: September 24, 1950, 71 y.o.   MRN: [id number]   HPI: Cassandra Stewart is a 71 y.o.-year-old female, returning for follow-up for DM2, dx in 1992, insulin-dependent since 2006, uncontrolled, with complications (CKD stage 3, PN, DR).  Last visit 4 months ago  Interim history: No increased urination, blurry vision, nausea. She continues to have neck and back pain.  Her very sophisticated wheelchair broke down in 07/2021 and she was not able to get it repaired yet.   Reviewed HbA1c levels: Lab Results  Component Value Date   HGBA1C 7.7 (A) 07/19/2021   HGBA1C 8.5 (A) 04/09/2021   HGBA1C 7.1 (A) 12/06/2020   HGBA1C 8.5 (A) 08/04/2020   HGBA1C 9.9 (A) 04/06/2020   HGBA1C 7.4 (A) 12/02/2019   HGBA1C 9.3 (A) 07/29/2019   HGBA1C 9.6 (A) 03/30/2019   HGBA1C 9.8 (A) 12/28/2018   HGBA1C 10.7 (A) 07/13/2018   HGBA1C 6.8 (A) 03/16/2018   HGBA1C 6.1 (A) 11/12/2017   HGBA1C 9.0 07/11/2017   HGBA1C 9.0 04/14/2017   HGBA1C 8.7% 10/24/2016   HGBA1C 9.6% 07/25/2016   HGBA1C 10.5% 03/04/2016   HGBA1C 8.1% 10/25/2015   HGBA1C 9.7 (H) 07/04/2015  03/16/2018: HbA1c calculated from fructosamine 6.28%  Pt was on a regimen of: - Metformin 500 mg 1x a day with dinner.  She had diarrhea with a higher dose. - Toujeo 45 units in am - Humalog 18 units 2-3x a day, before meals Tried: Actos, Lantus.  Now on: - Metformin ER 500 mg 1-2x a day with meals. - Trulicity 1.5 >> 3 mg weekly - restarted 03/2021 - intermittently out 2/2 lack of supply - Toujeo 35 >> 28 >> 36 >> 40 units at night - Humalog 7-12 units before meals and 5 >> 7 units before snack at night  At last visit she was not taking blood sugars that she did not have transmitters ([facility] did not send them).  Now taking >4 times a day with her Dexcom CGM (with receiver):   Previously:   Previously:   Lowest sugar was 49 (took insulin but forgot to eat) >> 64 >> 40; she has hypoglycemia awareness in  the 80s. Highest sugar was 383 >> 143 >> 296 >> 269 >> 357.  Glucometer: Freestyle  Pt's meals are: - Breakfast/brunch: egg, bacon, toast >> no b'fast  - Lunch: snack mostly (no Humalog) >> Meals on Wheels - Dinner: chicken/fish + vegetables >> moved before 7 pm - Snacks: Belvita cracker if hungry  -+ Stage III CKD; last BUN/creatinine:  Lab Results  Component Value Date   BUN 18 10/15/2021   BUN 20 05/27/2021   CREATININE 1.00 10/15/2021   CREATININE 1.07 (H) 05/27/2021   Lab Results  Component Value Date   GFRAA 54 (L) 03/02/2020   GFRAA 59 (L) 08/31/2019   GFRAA 58 (L) 03/24/2019   GFRAA 59 (L) 03/12/2019   GFRAA 55 (L) 03/08/2019  Previously on lisinopril 10, but stopped due to hyperkalemia.   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 Vanessa Jadalynn).  She has history of cataract surgery OU.  -+ Numbness and tingling in her fingers-on Neurontin 300 mg 3x a day per PCP.  Latest foot exam 03/2021.  On ASA 81.  Low vitamin B12:  Reviewed B12 levels: Lab Results  Component Value Date   VITAMINB12 >1550 (H) 12/06/2020   VITAMINB12 >1526 (H) 07/29/2019   VITAMINB12 >1500 (H) 03/30/2019   VITAMINB12 >1500 (H) 12/28/2018   VITAMINB12 261 03/16/2018   VITAMINB12 300 04/14/2017   VITAMINB12 478 10/25/2015  05/01/2016: Vit B12 248.  We initially started 5000 mcg B12 daily, which was then decreased to 2500 mcg daily and, then to 1000 mcg daily >> every other day >> 2x a week - 11/2020.  She continues to be in a wheelchair as her left leg is very weak-believed to be from a herniated intervertebral disc.  She had surgery for this in 2016 but the strength did not improve. She was in the emergency room on 10/24/2020 for R wrist pain and swelling.  She also sees  Dr. Unk Lightning.  No fracture. Has a brace. Her ulnar nerve is affected by using her home wheelchair. She was in the emergency room 11/28/2020 for chest pain.  Troponins were negative.  D-dimer was still elevated.  She has an extensive FH of heart disease - women in her family. Daughter died after her heart stopped.  ROS: + See HPI Neurological: no tremors/+ numbness/+ tingling-fingers/no dizziness  I reviewed pt's medications, allergies, PMH, social hx, family hx, and changes were documented in the history of present illness. Otherwise, unchanged from my initial visit note.  Past Medical History:  Diagnosis Date   Elevated LFTs    Fatty liver    on [place].    Hyperlipemia    Hypertension    Inguinal hernia    2020 CT   Morbid obesity ([place]) 07/04/2015   Stage 3b chronic kidney disease ([place])    Thoracic spondylosis with myelopathy    Umbilical hernia    [id number] CT   Uncontrolled type 2 diabetes with neuropathy    Wheelchair bound    paraplegia of Leg - Left   Past Surgical History:  Procedure Laterality Date   APPENDECTOMY  1969   BREAST BIOPSY Right    CATARACT EXTRACTION     May 21016   LUMBAR [place]     LUMBAR LAMINECTOMY/DECOMPRESSION MICRODISCECTOMY N/A 07/06/2015   Procedure: Thoracic ten- eleven laminectomy for spinal canal decompression;  Surgeon: Ashok Pall, MD;  Location: [facility];  Service: Orthopedics;  Laterality: N/A;   Social History   Social History   Marital status: Divorced    Spouse name: N/A   Number of children: 0   Occupational History   None   Social History Main Topics   Smoking status: Never Smoker   Smokeless tobacco: Never Used   Alcohol use No   Drug use: No   Current Outpatient Medications on File Prior to Visit  Medication Sig Dispense Refill   ACCU-CHEK GUIDE test strip USE AS DIRECTED TO TEST BLOOD SUGAR FOUR TIMES DAILY 400 strip 1   acetaminophen (TYLENOL) 650 MG CR tablet Take 650 mg by mouth every 8 (eight) hours as needed for  pain.     amLODipine (NORVASC) 10 MG tablet Take 1 tablet (10 mg total) by mouth daily. 90 tablet 3   aspirin EC 81 MG tablet Take 81 mg by mouth every evening.     Cholecalciferol (VITAMIN D PO) Take 1,000 mg by mouth daily.      Continuous Blood Gluc Receiver ([place]) DEVI by Does not apply route.     Cyanocobalamin (VITAMIN B 12 PO) Take 5,000  mg by mouth daily.     diclofenac Sodium (VOLTAREN) 1 % GEL Apply 4 g topically 4 (four) times daily. 100 g 0   dicyclomine (BENTYL) 10 MG capsule Take 1 capsule (10 mg total) by mouth every 8 (eight) hours as needed for spasms. 60 capsule 1   Dulaglutide (TRULICITY) 3 JM/4.2AS SOPN Inject 3 mg into the skin once a week. 6 mL 3   gabapentin (NEURONTIN) 300 MG capsule TAKE 1 CAPSULE(300 MG) BY MOUTH THREE TIMES DAILY 270 capsule 1   hydrochlorothiazide (MICROZIDE) 12.5 MG capsule Take 1 capsule (12.5 mg total) by mouth daily. 90 capsule 3   insulin lispro (HUMALOG) 100 UNIT/ML KwikPen INJECT 7-10 UNITS UNDER THE SKIN 2-3 TIMES DAILY 30 mL 3   Insulin Pen Needle (CAREFINE PEN NEEDLES) 32G X 4 MM MISC Use 4x a day 300 each 3   lovastatin (MEVACOR) 40 MG tablet Take 1 tablet (40 mg total) by mouth at bedtime. 90 tablet 3   metFORMIN (GLUCOPHAGE-XR) 500 MG 24 hr tablet TAKE 1 TABLET(500 MG) BY MOUTH TWICE DAILY 180 tablet 3   nitroGLYCERIN (NITROSTAT) 0.4 MG SL tablet Place 1 tablet (0.4 mg total) under the tongue every 5 (five) minutes as needed for chest pain. 25 tablet 3   polyethylene glycol (MIRALAX / GLYCOLAX) 17 g packet Take 17 g by mouth as needed.     TOUJEO SOLOSTAR 300 UNIT/ML Solostar Pen ADMINISTER 40 UNITS UNDER THE SKIN AT BEDTIME 12 mL 3   traMADol (ULTRAM) 50 MG tablet Take 1 tablet (50 mg total) by mouth every 12 (twelve) hours as needed. 60 tablet 2   No current facility-administered medications on file prior to visit.   Allergies  Allergen Reactions   Other     PT PREFERS TO NOT HAVE ANY NARCOTIC MEDICATIONS    Chlorhexidine Rash    Burning after using CHG wipes-used for surgery   Oxycodone Hcl     Other reaction(s): Hallucination Marked hallucinations and palpitations following dose of 10mg  on 04/30/2015    Family History  Problem Relation Age of Onset   Diabetes Mother    Heart disease Mother    Pancreatic cancer Sister    Kidney disease Brother        Two brothers on ESRD   Amblyopia Neg Hx    Blindness Neg Hx    Cataracts Neg Hx    Glaucoma Neg Hx    Macular degeneration Neg Hx    Retinal detachment Neg Hx    Strabismus Neg Hx    Retinitis pigmentosa Neg Hx    Colon cancer Neg Hx    Esophageal cancer Neg Hx    Pt has FH of DM in M, MGM, PGM, M aunt, uncles.  PE: There were no vitals taken for this visit. Wt Readings from Last 3 Encounters:  10/15/21 216 lb (98 kg)  07/19/21 215 lb 3.2 oz (97.6 kg)  05/27/21 213 lb (96.6 kg)   Constitutional: overweight, in NAD, in wheelchair Eyes: EOMI, no exophthalmos ENT: no thyromegaly, no cervical lymphadenopathy Cardiovascular: RRR, No MRG, + L ankle edema - pitting after fall 2017 Respiratory: CTA B Musculoskeletal: + paraplegia Skin: no rashes Neurological: no tremor with outstretched hands  ASSESSMENT: 1. DM2, insulin-dependent, uncontrolled, with complications - CKD stage 3 - PN - DR  - Of note, we tried to prescribe the Freestyle libre CGM for her but this was not covered by the insurance  2. Low B12  3. HL  PLAN:  1. Patient with longstanding, uncontrolled, type 2 diabetes, on basal/bolus insulin regimen and also metformin and GLP-1 receptor agonist, with improved control at last visit.  At that time, sugars improved overnight especially in the previous 2 weeks and also later in the day, after starting back on Trulicity at the previous visit.  She was missing Humalog doses of sugars were still high after lunch and dinner.  We discussed about possibly increasing the Trulicity dose but she wanted to hold off until she was  able to take Humalog before each meal.  HbA1c at last visit was 7.7%, decreased. CGM interpretation: -At today's visit, we reviewed her CGM downloads: It appears that 65% of values are in target range (goal >70%), while 33% are higher than 180 (goal <25%), and 2% are lower than 70 (goal <4%).  The calculated average blood sugar is 160.  The projected HbA1c for the next 3 months (GMI) is 7.1%. -Reviewing the CGM trends, sugars are dropping a little too much overnight, with a nadir around 9 AM, but lasting from approximately 7 AM to 12 PM.  Therefore, I advised her to reduce her Toujeo dose.  Sugars later in the day are improved, and now only some of the blood sugars appear to be higher than goal after lunch and a smaller amount after dinner.  I did advise her that for some years, especially lunches, she may need to take a higher amount of Humalog, but otherwise, we can continue with the same doses. -Also, advised her to continue metformin and Trulicity.  She had problems obtaining Trulicity from the pharmacy so I called this in again today - I suggested to:  Patient Instructions  Please continue: - Metformin ER 500 mg 1-2x a day with meals - Trulicity 3 mg weekly  Change: - Toujeo 34 units at night - Humalog 12-20 units before the meals (higher doses with lunch), and 7 units before the snack at night   Please continue B12 1000 mcg 2x a week.  Please return in 4 months.   - we checked her HbA1c: 7.3% (improved) - advised to check sugars at different times of the day - 4x a day, rotating check times - advised for yearly eye exams >> she is UTD - UTD with foot exam.  She has peripheral neuropathy for which she is on Neurontin-refills per PCP - return to clinic in 3-4 months  2. Low B12 -He has a history of B12 deficiency, for which we started supplementation with p.o. B12 -She is currently on 1000 mcg twice a week -latest B12 level: Lab Results  Component Value Date   VITAMINB12 >1550 (H)  12/06/2020   VITAMINB12 >1526 (H) 07/29/2019  -She did not have another B12 level checked in 09/2021, as advised -will check this today  3. HL -Reviewed latest lipid panel from 09/2021: Fractions at goal: Lab Results  Component Value Date   CHOL 158 10/15/2021   HDL 72 10/15/2021   LDLCALC 62 10/15/2021   TRIG 143 10/15/2021   CHOLHDL 2.2 10/15/2021  -She continues of Mevacor 40 mg daily without side effects  Component     Latest Ref Rng 11/28/2021  Vitamin B12     211 - 911 pg/mL >1500 (H)     Vitamin B12 is still high.  I will advise her to only take the vitamin B12 supplement once a week.  Philemon Kingdom, MD PhD [facility] Endocr[facility]

## 2021-11-28 NOTE — Patient Instructions (Addendum)
Please continue: - Metformin ER 500 mg 1-2x a day with meals - Trulicity 3 mg weekly  Change: - Toujeo 34 units at night - Humalog 12-20 units before the meals (higher doses with lunch), and 7 units before the snack at night   Please continue B12 1000 mcg 2x a week.  Please return in 4 months.

## 2021-12-05 DIAGNOSIS — Z794 Long term (current) use of insulin: Secondary | ICD-10-CM | POA: Diagnosis not present

## 2021-12-05 DIAGNOSIS — E118 Type 2 diabetes mellitus with unspecified complications: Secondary | ICD-10-CM | POA: Diagnosis not present

## 2021-12-06 ENCOUNTER — Other Ambulatory Visit: Payer: Self-pay | Admitting: Internal Medicine

## 2021-12-07 ENCOUNTER — Ambulatory Visit (INDEPENDENT_AMBULATORY_CARE_PROVIDER_SITE_OTHER): Payer: Medicare Other

## 2021-12-07 VITALS — Ht 64.5 in | Wt 212.0 lb

## 2021-12-07 DIAGNOSIS — Z0000 Encounter for general adult medical examination without abnormal findings: Secondary | ICD-10-CM

## 2021-12-07 NOTE — Patient Instructions (Signed)
Cassandra Stewart , Thank you for taking time to come for your Medicare Wellness Visit. I appreciate your ongoing commitment to your health goals. Please review the following plan we discussed and let me know if I can assist you in the future.   Screening recommendations/referrals: Colonoscopy: patient states she completed FOBT Mammogram: n/a Bone Density: completed 01/06/2018 Recommended yearly ophthalmology/optometry visit for glaucoma screening and checkup Recommended yearly dental visit for hygiene and checkup  Vaccinations: Influenza vaccine: n/a Pneumococcal vaccine: completed 04/10/2011 Tdap vaccine: n/a Shingles vaccine: discussed    Covid-19:10/12/2019, 09/16/2019  Advanced directives: copy in chart  Conditions/risks identified: none  Next appointment: Follow up in one year for your annual wellness visit    Preventive Care 39 Years and Older, Female Preventive care refers to lifestyle choices and visits with your health care provider that can promote health and wellness. What does preventive care include? A yearly physical exam. This is also called an annual well check. Dental exams once or twice a year. Routine eye exams. Ask your health care provider how often you should have your eyes checked. Personal lifestyle choices, including: Daily care of your teeth and gums. Regular physical activity. Eating a healthy diet. Avoiding tobacco and drug use. Limiting alcohol use. Practicing safe sex. Taking low-dose aspirin every day. Taking vitamin and mineral supplements as recommended by your health care provider. What happens during an annual well check? The services and screenings done by your health care provider during your annual well check will depend on your age, overall health, lifestyle risk factors, and family history of disease. Counseling  Your health care provider may ask you questions about your: Alcohol use. Tobacco use. Drug use. Emotional well-being. Home  and relationship well-being. Sexual activity. Eating habits. History of falls. Memory and ability to understand (cognition). Work and work Statistician. Reproductive health. Screening  You may have the following tests or measurements: Height, weight, and BMI. Blood pressure. Lipid and cholesterol levels. These may be checked every 5 years, or more frequently if you are over 68 years old. Skin check. Lung cancer screening. You may have this screening every year starting at age 86 if you have a 30-pack-year history of smoking and currently smoke or have quit within the past 15 years. Fecal occult blood test (FOBT) of the stool. You may have this test every year starting at age 74. Flexible sigmoidoscopy or colonoscopy. You may have a sigmoidoscopy every 5 years or a colonoscopy every 10 years starting at age 33. Hepatitis C blood test. Hepatitis B blood test. Sexually transmitted disease (STD) testing. Diabetes screening. This is done by checking your blood sugar (glucose) after you have not eaten for a while (fasting). You may have this done every 1-3 years. Bone density scan. This is done to screen for osteoporosis. You may have this done starting at age 69. Mammogram. This may be done every 1-2 years. Talk to your health care provider about how often you should have regular mammograms. Talk with your health care provider about your test results, treatment options, and if necessary, the need for more tests. Vaccines  Your health care provider may recommend certain vaccines, such as: Influenza vaccine. This is recommended every year. Tetanus, diphtheria, and acellular pertussis (Tdap, Td) vaccine. You may need a Td booster every 10 years. Zoster vaccine. You may need this after age 93. Pneumococcal 13-valent conjugate (PCV13) vaccine. One dose is recommended after age 70. Pneumococcal polysaccharide (PPSV23) vaccine. One dose is recommended after age 26. Talk to  your health care provider  about which screenings and vaccines you need and how often you need them. This information is not intended to replace advice given to you by your health care provider. Make sure you discuss any questions you have with your health care provider. Document Released: 03/10/2015 Document Revised: 11/01/2015 Document Reviewed: 12/13/2014 Elsevier Interactive Patient Education  2017 [place] Prevention in the Home Falls can cause injuries. They can happen to people of all ages. There are many things you can do to make your home safe and to help prevent falls. What can I do on the outside of my home? Regularly fix the edges of walkways and driveways and fix any cracks. Remove anything that might make you trip as you walk through a door, such as a raised step or threshold. Trim any bushes or trees on the path to your home. Use bright outdoor lighting. Clear any walking paths of anything that might make someone trip, such as rocks or tools. Regularly check to see if handrails are loose or broken. Make sure that both sides of any steps have handrails. Any raised decks and porches should have guardrails on the edges. Have any leaves, snow, or ice cleared regularly. Use sand or salt on walking paths during winter. Clean up any spills in your garage right away. This includes oil or grease spills. What can I do in the bathroom? Use night lights. Install grab bars by the toilet and in the tub and shower. Do not use towel bars as grab bars. Use non-skid mats or decals in the tub or shower. If you need to sit down in the shower, use a plastic, non-slip stool. Keep the floor dry. Clean up any water that spills on the floor as soon as it happens. Remove soap buildup in the tub or shower regularly. Attach bath mats securely with double-sided non-slip rug tape. Do not have throw rugs and other things on the floor that can make you trip. What can I do in the bedroom? Use night lights. Make sure  that you have a light by your bed that is easy to reach. Do not use any sheets or blankets that are too big for your bed. They should not hang down onto the floor. Have a firm chair that has side arms. You can use this for support while you get dressed. Do not have throw rugs and other things on the floor that can make you trip. What can I do in the kitchen? Clean up any spills right away. Avoid walking on wet floors. Keep items that you use a lot in easy-to-reach places. If you need to reach something above you, use a strong step stool that has a grab bar. Keep electrical cords out of the way. Do not use floor polish or wax that makes floors slippery. If you must use wax, use non-skid floor wax. Do not have throw rugs and other things on the floor that can make you trip. What can I do with my stairs? Do not leave any items on the stairs. Make sure that there are handrails on both sides of the stairs and use them. Fix handrails that are broken or loose. Make sure that handrails are as long as the stairways. Check any carpeting to make sure that it is firmly attached to the stairs. Fix any carpet that is loose or worn. Avoid having throw rugs at the top or bottom of the stairs. If you do have throw rugs, attach  them to the floor with carpet tape. Make sure that you have a light switch at the top of the stairs and the bottom of the stairs. If you do not have them, ask someone to add them for you. What else can I do to help prevent falls? Wear shoes that: Do not have high heels. Have rubber bottoms. Are comfortable and fit you well. Are closed at the toe. Do not wear sandals. If you use a stepladder: Make sure that it is fully opened. Do not climb a closed stepladder. Make sure that both sides of the stepladder are locked into place. Ask someone to hold it for you, if possible. Clearly mark and make sure that you can see: Any grab bars or handrails. First and last steps. Where the edge of  each step is. Use tools that help you move around (mobility aids) if they are needed. These include: Canes. Walkers. Scooters. Crutches. Turn on the lights when you go into a dark area. Replace any light bulbs as soon as they burn out. Set up your furniture so you have a clear path. Avoid moving your furniture around. If any of your floors are uneven, fix them. If there are any pets around you, be aware of where they are. Review your medicines with your doctor. Some medicines can make you feel dizzy. This can increase your chance of falling. Ask your doctor what other things that you can do to help prevent falls. This information is not intended to replace advice given to you by your health care provider. Make sure you discuss any questions you have with your health care provider. Document Released: 12/08/2008 Document Revised: 07/20/2015 Document Reviewed: 03/18/2014 Elsevier Interactive Patient Education  2017 [employer].

## 2021-12-07 NOTE — Progress Notes (Signed)
I connected with Cassandra Stewart today by telephone and verified that I am speaking with the correct person using two identifiers. Location patient: home Location provider: work Persons participating in the virtual visit: Mary-Anne Fendt, Glenna Durand LPN.   I discussed the limitations, risks, security and privacy concerns of performing an evaluation and management service by telephone and the availability of in person appointments. I also discussed with the patient that there may be a patient responsible charge related to this service. The patient expressed understanding and verbally consented to this telephonic visit.    Interactive audio and video telecommunications were attempted between this provider and patient, however failed, due to patient having technical difficulties OR patient did not have access to video capability.  We continued and completed visit with audio only.     Vital signs may be patient reported or missing.  Subjective:   Cassandra Stewart is a 71 y.o. female who presents for Medicare Annual (Subsequent) preventive examination.  Review of Systems     Cardiac Risk Factors include: advanced age (>104men, >50 women);diabetes mellitus;dyslipidemia;hypertension;obesity (BMI >30kg/m2)     Objective:    Today's Vitals   12/07/21 1326 12/07/21 1327  Weight: 212 lb (96.2 kg)   Height: 5' 4.5" (1.638 m)   PainSc:  8    Body mass index is 35.83 kg/m.     12/07/2021    1:32 PM 05/27/2021    2:56 PM 04/20/2021   11:29 AM 12/04/2020    2:24 PM 11/28/2020    3:38 PM 11/30/2018    9:20 PM 08/10/2018   12:53 PM  Advanced Directives  Does Patient Have a Medical Advance Directive? Yes No No No No No No  Type of Advance Directive Out of facility DNR (pink MOST or yellow form)        Would patient like information on creating a medical advance directive?    Yes (MAU/Ambulatory/Procedural Areas - Information given)       Current Medications (verified) Outpatient  Encounter Medications as of 12/07/2021  Medication Sig   ACCU-CHEK GUIDE test strip USE AS DIRECTED TO TEST BLOOD SUGAR FOUR TIMES DAILY   acetaminophen (TYLENOL) 650 MG CR tablet Take 650 mg by mouth every 8 (eight) hours as needed for pain.   amLODipine (NORVASC) 10 MG tablet Take 1 tablet (10 mg total) by mouth daily.   aspirin EC 81 MG tablet Take 81 mg by mouth every evening.   Cholecalciferol (VITAMIN D PO) Take 1,000 mg by mouth daily.    Continuous Blood Gluc Receiver ([place]) DEVI by Does not apply route.   Cyanocobalamin (VITAMIN B 12 PO) Take 5,000 mg by mouth daily.   diclofenac Sodium (VOLTAREN) 1 % GEL Apply 4 g topically 4 (four) times daily.   dicyclomine (BENTYL) 10 MG capsule Take 1 capsule (10 mg total) by mouth every 8 (eight) hours as needed for spasms.   Dulaglutide (TRULICITY) 3 0000000 SOPN Inject 3 mg into the skin once a week.   gabapentin (NEURONTIN) 300 MG capsule TAKE 1 CAPSULE(300 MG) BY MOUTH THREE TIMES DAILY   hydrochlorothiazide (MICROZIDE) 12.5 MG capsule Take 1 capsule (12.5 mg total) by mouth daily.   insulin lispro (HUMALOG) 100 UNIT/ML KwikPen INJECT 7-10 UNITS UNDER THE SKIN 2-3 TIMES DAILY   Insulin Pen Needle (CAREFINE PEN NEEDLES) 32G X 4 MM MISC Use 4x a day   lovastatin (MEVACOR) 40 MG tablet Take 1 tablet (40 mg total) by mouth at bedtime.   metFORMIN (GLUCOPHAGE-XR)  500 MG 24 hr tablet TAKE 1 TABLET(500 MG) BY MOUTH TWICE DAILY   nitroGLYCERIN (NITROSTAT) 0.4 MG SL tablet Place 1 tablet (0.4 mg total) under the tongue every 5 (five) minutes as needed for chest pain.   polyethylene glycol (MIRALAX / GLYCOLAX) 17 g packet Take 17 g by mouth as needed.   TOUJEO SOLOSTAR 300 UNIT/ML Solostar Pen ADMINISTER 34-36 UNITS UNDER THE SKIN AT BEDTIME   traMADol (ULTRAM) 50 MG tablet Take 1 tablet (50 mg total) by mouth every 12 (twelve) hours as needed.   No facility-administered encounter medications on file as of 12/07/2021.    Allergies  (verified) Other, Chlorhexidine, and Oxycodone hcl   History: Past Medical History:  Diagnosis Date   Elevated LFTs    Fatty liver    on [place].    Hyperlipemia    Hypertension    Inguinal hernia    2020 CT   Morbid obesity ([place]) 07/04/2015   Stage 3b chronic kidney disease ([place])    Thoracic spondylosis with myelopathy    Umbilical hernia    [id number] CT   Uncontrolled type 2 diabetes with neuropathy    Wheelchair bound    paraplegia of Leg - Left   Past Surgical History:  Procedure Laterality Date   APPENDECTOMY  1969   BREAST BIOPSY Right    CATARACT EXTRACTION     May 21016   LUMBAR [place]     LUMBAR LAMINECTOMY/DECOMPRESSION MICRODISCECTOMY N/A 07/06/2015   Procedure: Thoracic ten- eleven laminectomy for spinal canal decompression;  Surgeon: Ashok Pall, MD;  Location: [facility];  Service: Orthopedics;  Laterality: N/A;   Family History  Problem Relation Age of Onset   Diabetes Mother    Heart disease Mother    Pancreatic cancer Sister    Kidney disease Brother        Two brothers on ESRD   Amblyopia Neg Hx    Blindness Neg Hx    Cataracts Neg Hx    Glaucoma Neg Hx    Macular degeneration Neg Hx    Retinal detachment Neg Hx    Strabismus Neg Hx    Retinitis pigmentosa Neg Hx    Colon cancer Neg Hx    Esophageal cancer Neg Hx    Social History   Socioeconomic History   Marital status: Divorced    Spouse name: Not on file   Number of children: Not on file   Years of education: Not on file   Highest education level: Not on file  Occupational History   Not on file  Tobacco Use   Smoking status: Never   Smokeless tobacco: Never  Vaping Use   Vaping Use: Never used  Substance and Sexual Activity   Alcohol use: No   Drug use: No   Sexual activity: Not Currently  Other Topics Concern   Not on file  Social History Narrative   Not on file   Social Determinants of Health   Financial Resource Strain: Low Risk  (12/07/2021)   Overall Financial  Resource Strain (CARDIA)    Difficulty of Paying Living Expenses: Not hard at all  Food Insecurity: No Food Insecurity (12/07/2021)   Hunger Vital Sign    Worried About Running Out of Food in the Last Year: Never true    Ran Out of Food in the Last Year: Never true  Transportation Needs: No Transportation Needs (12/07/2021)   PRAPARE - Transportation    Lack of Transportation (Medical): No    Lack  of Transportation (Non-Medical): No  Physical Activity: Insufficiently Active (12/07/2021)   Exercise Vital Sign    Days of Exercise per Week: 2 days    Minutes of Exercise per Session: 20 min  Stress: No Stress Concern Present (12/07/2021)   [place]    Feeling of Stress : Only a little  Recent Concern: Stress - Stress Concern Present (12/07/2021)   [place]    Feeling of Stress : To some extent  Social Connections: Not on file    Tobacco Counseling Counseling given: Not Answered   Clinical Intake:  Pre-visit preparation completed: Yes  Pain : 0-10 Pain Score: 8  Pain Type: Chronic pain Pain Location: Leg Pain Orientation: Left Pain Descriptors / Indicators: Aching Pain Onset: More than a month ago Pain Frequency: Intermittent Pain Relieving Factors: elevating it  Pain Relieving Factors: elevating it  Nutritional Status: BMI > 30  Obese Nutritional Risks: None Diabetes: Yes  How often do you need to have someone help you when you read instructions, pamphlets, or other written materials from your doctor or pharmacy?: 1 - Never  Diabetic? Yes Nutrition Risk Assessment:  Has the patient had any N/V/D within the last 2 months?  No  Does the patient have any non-healing wounds?  No  Has the patient had any unintentional weight loss or weight gain?  No   Diabetes:  Is the patient diabetic?  Yes  If diabetic, was a CBG obtained today?   No  Did the patient bring in their glucometer from home?  No  How often do you monitor your CBG's? frequently.   Financial Strains and Diabetes Management:  Are you having any financial strains with the device, your supplies or your medication? No .  Does the patient want to be seen by Chronic Care Management for management of their diabetes?  No  Would the patient like to be referred to a Nutritionist or for Diabetic Management?  No   Diabetic Exams:  Diabetic Eye Exam: Completed 07/06/2021 Diabetic Foot Exam: Completed 04/09/2021   Interpreter Needed?: No  Information entered by :: NAllen LPN   Activities of Daily Living    12/07/2021    1:37 PM  In your present state of health, do you have any difficulty performing the following activities:  Hearing? 0  Vision? 0  Difficulty concentrating or making decisions? 0  Walking or climbing stairs? 1  Dressing or bathing? 0  Doing errands, shopping? 0  Preparing Food and eating ? N  Using the Toilet? N  In the past six months, have you accidently leaked urine? N  Do you have problems with loss of bowel control? N  Managing your Medications? N  Managing your Finances? N  Housekeeping or managing your Housekeeping? N    Patient Care Team: Marcellina Millin as PCP - General (Physician Assistant) Philemon Kingdom, MD as Consulting Physician (Internal Medicine) Courtney Heys, MD as Consulting Physician (Physical Medicine and Rehabilitation) Bernarda Caffey, MD as Consulting Physician (Ophthalmology)  Indicate any recent Medical Services you may have received from other than [facility] providers in the past year (date may be approximate).     Assessment:   This is a routine wellness examination for Cassandra Stewart.  Hearing/Vision screen Vision Screening - Comments:: Regular eye exams, Dr. Adria Dill  Dietary issues and exercise activities discussed: Current Exercise Habits: Home exercise routine, Type of exercise: stretching, Time  (Minutes): 20, Frequency (  Times/Week): 2, Weekly Exercise (Minutes/Week): 40   Goals Addressed             This Visit's Progress    Patient Stated       12/07/2021, wants to walk again       Depression Screen    12/07/2021    1:34 PM 10/15/2021   10:34 AM 07/30/2021   11:11 AM 04/20/2021   11:28 AM 01/12/2021   10:42 AM 10/16/2020   11:45 AM 08/09/2020   11:19 AM  PHQ 2/9 Scores  PHQ - 2 Score 2 0 0 6 0 0 0  PHQ- 9 Score 2          Fall Risk    12/07/2021    1:33 PM 10/15/2021   10:34 AM 07/30/2021   11:11 AM 04/20/2021   11:28 AM 04/16/2021   11:38 AM  Fall Risk   Falls in the past year? 0 0 0 0 1  Number falls in past yr: 0 0 0  0  Comment     slipped getting out of tub  Injury with Fall? 0 0   1  Comment     hurt her wrist  Risk for fall due to : Impaired mobility;Impaired balance/gait;Medication side effect No Fall Risks   History of fall(s)  Follow up Falls prevention discussed;Education provided;Falls evaluation completed Falls evaluation completed   Falls evaluation completed    FALL RISK PREVENTION PERTAINING TO THE HOME:  Any stairs in or around the home? No  If so, are there any without handrails? N/a Home free of loose throw rugs in walkways, pet beds, electrical cords, etc? Yes  Adequate lighting in your home to reduce risk of falls? Yes   ASSISTIVE DEVICES UTILIZED TO PREVENT FALLS:  Life alert? No  Use of a cane, walker or w/c? Yes  Grab bars in the bathroom? Yes  Shower chair or bench in shower? Yes  Elevated toilet seat or a handicapped toilet? No   TIMED UP AND GO:  Was the test performed? No .      Cognitive Function:        12/07/2021    1:39 PM  6CIT Screen  What Year? 0 points  What month? 0 points  What time? 0 points  Count back from 20 0 points  Months in reverse 0 points  Repeat phrase 2 points  Total Score 2 points    Immunizations Immunization History  Administered Date(s) Administered   PFIZER(Purple  Top)SARS-COV-2 Vaccination 09/16/2019, 10/12/2019   Pneumococcal Polysaccharide-23 04/10/2011    TDAP status: Due, Education has been provided regarding the importance of this vaccine. Advised may receive this vaccine at local pharmacy or Health Dept. Aware to provide a copy of the vaccination record if obtained from local pharmacy or Health Dept. Verbalized acceptance and understanding.  Flu Vaccine status: Declined, Education has been provided regarding the importance of this vaccine but patient still declined. Advised may receive this vaccine at local pharmacy or Health Dept. Aware to provide a copy of the vaccination record if obtained from local pharmacy or Health Dept. Verbalized acceptance and understanding.  Pneumococcal vaccine status: Declined,  Education has been provided regarding the importance of this vaccine but patient still declined. Advised may receive this vaccine at local pharmacy or Health Dept. Aware to provide a copy of the vaccination record if obtained from local pharmacy or Health Dept. Verbalized acceptance and understanding.   Covid-19 vaccine status: Completed vaccines  Qualifies for Shingles Vaccine?  Yes   Zostavax completed No   Shingrix Completed?: No.    Education has been provided regarding the importance of this vaccine. Patient has been advised to call insurance company to determine out of pocket expense if they have not yet received this vaccine. Advised may also receive vaccine at local pharmacy or Health Dept. Verbalized acceptance and understanding.  Screening Tests Health Maintenance  Topic Date Due   Zoster Vaccines- Shingrix (1 of 2) Never done   Diabetic kidney evaluation - Urine ACR  07/25/2017   MAMMOGRAM  07/16/2020   COVID-19 Vaccine (3 - Pfizer series) 02/11/2022 (Originally 12/07/2019)   Pneumonia Vaccine 25+ Years old (2 - PCV) 04/16/2022 (Originally 10/04/2013)   COLONOSCOPY (Pts 45-81yrs Insurance coverage will need to be confirmed)   05/30/2022 (Originally 10/04/1993)   FOOT EXAM  04/09/2022   HEMOGLOBIN A1C  05/30/2022   OPHTHALMOLOGY EXAM  07/07/2022   Diabetic kidney evaluation - GFR measurement  10/16/2022   DEXA SCAN  Completed   Hepatitis C Screening  Completed   HPV VACCINES  Aged Out   INFLUENZA VACCINE  Discontinued   TETANUS/TDAP  Discontinued    Health Maintenance  Health Maintenance Due  Topic Date Due   Zoster Vaccines- Shingrix (1 of 2) Never done   Diabetic kidney evaluation - Urine ACR  07/25/2017   MAMMOGRAM  07/16/2020    Colorectal cancer screening: No longer required.   Mammogram status: No longer required due to age.  Bone Density status: Completed 01/06/2018.   Lung Cancer Screening: (Low Dose CT Chest recommended if Age 39-80 years, 30 pack-year currently smoking OR have quit w/in 15years.) does not qualify.   Lung Cancer Screening Referral: no  Additional Screening:  Hepatitis C Screening: does qualify; Completed 10/25/2015  Vision Screening: Recommended annual ophthalmology exams for early detection of glaucoma and other disorders of the eye. Is the patient up to date with their annual eye exam?  Yes  Who is the provider or what is the name of the office in which the patient attends annual eye exams? Dr. Adria Dill If pt is not established with a provider, would they like to be referred to a provider to establish care? No .   Dental Screening: Recommended annual dental exams for proper oral hygiene  Community Resource Referral / Chronic Care Management: CRR required this visit?  No   CCM required this visit?  No      Plan:     I have personally reviewed and noted the following in the patient's chart:   Medical and social history Use of alcohol, tobacco or illicit drugs  Current medications and supplements including opioid prescriptions. Patient is not currently taking opioid prescriptions. Functional ability and status Nutritional status Physical activity Advanced  directives List of other physicians Hospitalizations, surgeries, and ER visits in previous 12 months Vitals Screenings to include cognitive, depression, and falls Referrals and appointments  In addition, I have reviewed and discussed with patient certain preventive protocols, quality metrics, and best practice recommendations. A written personalized care plan for preventive services as well as general preventive health recommendations were provided to patient.     Kellie Simmering, LPN   [id number]   Nurse Notes: none  Due to this being a virtual visit, the after visit summary with patients personalized plan was offered to patient via mail or my-chart. to pick up at office at next visit

## 2022-01-04 DIAGNOSIS — E118 Type 2 diabetes mellitus with unspecified complications: Secondary | ICD-10-CM | POA: Diagnosis not present

## 2022-01-04 DIAGNOSIS — Z794 Long term (current) use of insulin: Secondary | ICD-10-CM | POA: Diagnosis not present

## 2022-02-03 DIAGNOSIS — E118 Type 2 diabetes mellitus with unspecified complications: Secondary | ICD-10-CM | POA: Diagnosis not present

## 2022-02-03 DIAGNOSIS — Z794 Long term (current) use of insulin: Secondary | ICD-10-CM | POA: Diagnosis not present

## 2022-03-06 ENCOUNTER — Encounter: Payer: Self-pay | Admitting: Physical Medicine and Rehabilitation

## 2022-03-06 ENCOUNTER — Encounter: Payer: 59 | Attending: Physical Medicine and Rehabilitation | Admitting: Physical Medicine and Rehabilitation

## 2022-03-06 VITALS — BP 113/81 | HR 67 | Ht 64.5 in

## 2022-03-06 DIAGNOSIS — G5603 Carpal tunnel syndrome, bilateral upper limbs: Secondary | ICD-10-CM

## 2022-03-06 DIAGNOSIS — E66812 Obesity, class 2: Secondary | ICD-10-CM

## 2022-03-06 DIAGNOSIS — Z6836 Body mass index (BMI) 36.0-36.9, adult: Secondary | ICD-10-CM | POA: Diagnosis present

## 2022-03-06 DIAGNOSIS — G8222 Paraplegia, incomplete: Secondary | ICD-10-CM | POA: Diagnosis not present

## 2022-03-06 DIAGNOSIS — Z993 Dependence on wheelchair: Secondary | ICD-10-CM

## 2022-03-06 NOTE — Patient Instructions (Signed)
HPI Pt is a 72 yr old R handed female  With hx of HTN, DM- poorly controlled per chart; obesity- BMI >35, HLD, Vit B levels are high- with hx of thoracic spondylosis/myelopathy and LE weakness- mainly LLE weak.  went into w/c- nontraumatic - 2014. Here for f/u on weakness. w/c dependent. In power w/c.  Also new R wrist pain- ulnar styloid pain/TTP Here for f/u on Thoracic nontraumatic myelopathy- incomplete paraplegia.  Also has carpal tunnel syndrome on R.     Can use tennis balls or Muscle Hook from [employer] every day, but not more than 40 minutes/day. Hold pressure more than 2 minutes 2. Might consider SCI support group in Spring/summer- but doesn't get out much in Winter.  Has to use [employer] for transportation- it's a LOT  3. Con't to wear Carpal tunnel brace when possible/at home.    4. Usually has issues, but today doing pretty good.    5.  Doesn't have increased risk of Cardiac issues from SCI; but does have risk of osteoporosis- which puts you at increased risk of fractures of legs/pelvis/hips- so doing ankles weights ~ 1lb- and moving legs around as much as possible can slow progression some.    6. F/U in 3 months- double visit.
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
## 2022-03-19 ENCOUNTER — Other Ambulatory Visit: Payer: Self-pay | Admitting: Physician Assistant

## 2022-04-04 ENCOUNTER — Ambulatory Visit (INDEPENDENT_AMBULATORY_CARE_PROVIDER_SITE_OTHER): Payer: 59 | Admitting: Internal Medicine

## 2022-04-04 ENCOUNTER — Encounter: Payer: Self-pay | Admitting: Internal Medicine

## 2022-04-04 VITALS — BP 118/72 | HR 71 | Ht 64.5 in | Wt 218.0 lb

## 2022-04-04 DIAGNOSIS — E538 Deficiency of other specified B group vitamins: Secondary | ICD-10-CM

## 2022-04-04 DIAGNOSIS — E1142 Type 2 diabetes mellitus with diabetic polyneuropathy: Secondary | ICD-10-CM | POA: Diagnosis not present

## 2022-04-04 DIAGNOSIS — E782 Mixed hyperlipidemia: Secondary | ICD-10-CM | POA: Diagnosis not present

## 2022-04-04 DIAGNOSIS — E1165 Type 2 diabetes mellitus with hyperglycemia: Secondary | ICD-10-CM

## 2022-04-04 LAB — POCT GLYCOSYLATED HEMOGLOBIN (HGB A1C): Hemoglobin A1C: 7.5 % — AB (ref 4.0–5.6)

## 2022-04-04 NOTE — Patient Instructions (Signed)
Please continue: - Metformin ER 500 mg 1-2x a day with meals - Trulicity 3 mg weekly - Toujeo 34 units at night - Humalog 12-20 units before the meals (higher doses with lunch), and 7 units before the snack at night   Please continue B12 1000 mcg 1x a week.  Please return in 4 months.

## 2022-04-04 NOTE — Progress Notes (Signed)
Patient ID: Cassandra Stewart, female   DOB: 21-Oct-1950, 72 y.o.   MRN: [id number]   HPI: Cassandra Stewart is a 72 y.o.-year-old female, returning for follow-up for DM2, dx in 1992, insulin-dependent since 2006, uncontrolled, with complications (CKD stage 3, PN, DR).  Last visit 4 months ago  Interim history: No increased urination, blurry vision, nausea. She has swelling in her legs, especially L.   Reviewed HbA1c levels: Lab Results  Component Value Date   HGBA1C 7.3 (A) 11/28/2021   HGBA1C 7.7 (A) 07/19/2021   HGBA1C 8.5 (A) 04/09/2021   HGBA1C 7.1 (A) 12/06/2020   HGBA1C 8.5 (A) 08/04/2020   HGBA1C 9.9 (A) 04/06/2020   HGBA1C 7.4 (A) 12/02/2019   HGBA1C 9.3 (A) 07/29/2019   HGBA1C 9.6 (A) 03/30/2019   HGBA1C 9.8 (A) 12/28/2018   HGBA1C 10.7 (A) 07/13/2018   HGBA1C 6.8 (A) 03/16/2018   HGBA1C 6.1 (A) 11/12/2017   HGBA1C 9.0 07/11/2017   HGBA1C 9.0 04/14/2017   HGBA1C 8.7% 10/24/2016   HGBA1C 9.6% 07/25/2016   HGBA1C 10.5% 03/04/2016   HGBA1C 8.1% 10/25/2015   HGBA1C 9.7 (H) 07/04/2015  03/16/2018: HbA1c calculated from fructosamine 6.28%  Pt was on a regimen of: - Metformin 500 mg 1x a day with dinner.  She had diarrhea with a higher dose. - Toujeo 45 units in am - Humalog 18 units 2-3x a day, before meals Tried: Actos, Lantus.  Now on: - Metformin ER 500 mg 1-2x a day with meals. - Trulicity 1.5 >> 3 mg weekly - restarted 03/2021 - intermittently out 2/2 lack of supply - Toujeo 35 >> 28 >> 36 >> 40 >> 34 units at night - Humalog 7-12 units before meals and 5 >> 7 units before snack at night >> 12-20 units before the meals (higher doses with lunch), and 7 units before the snack at night   At last visit she was not taking blood sugars that she did not have transmitters ([place] did not send them).  Now taking >4 times a day with her Dexcom CGM (with receiver):   Previously:   Previously:    Lowest sugar was 49 (took insulin but forgot to eat) >> 64 >> 40 >>  49; she has hypoglycemia awareness in the 80s. Highest sugar was 383 >> 143 >> 296 >> 269 >> 357 >> 393 (cereal: fruity pebbles).  Glucometer: Freestyle  Pt's meals are: - Breakfast/brunch: egg, bacon, toast >> no b'fast  - Lunch: snack mostly (no Humalog) >> Meals on Wheels - Dinner: chicken/fish + vegetables >> moved before 7 pm - Snacks: Belvita cracker if hungry  -+ Stage III CKD; last BUN/creatinine:  Lab Results  Component Value Date   BUN 18 10/15/2021   BUN 20 05/27/2021   CREATININE 1.00 10/15/2021   CREATININE 1.07 (H) 05/27/2021   Lab Results  Component Value Date   GFRAA 54 (L) 03/02/2020   GFRAA 59 (L) 08/31/2019   GFRAA 58 (L) 03/24/2019   GFRAA 59 (L) 03/12/2019   GFRAA 55 (L) 03/08/2019  Previously on lisinopril 10, but stopped due to hyperkalemia.   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 Coralyn Pear).  She has history of cataract surgery OU.  -+ Numbness and tingling  in her fingers-on Neurontin 300 mg 3x a day per PCP.  Latest foot exam 03/2021. On ASA 81.  Low vitamin B12:  Reviewed B12 levels: Lab Results  Component Value Date   VITAMINB12 >1500 (H) 11/28/2021   VITAMINB12 >1550 (H) 12/06/2020   VITAMINB12 >1526 (H) 07/29/2019   VITAMINB12 >1500 (H) 03/30/2019   VITAMINB12 >1500 (H) 12/28/2018   VITAMINB12 261 03/16/2018   VITAMINB12 300 04/14/2017   VITAMINB12 478 10/25/2015  05/01/2016: Vit B12 248.  We initially started 5000 mcg B12 daily, which was then decreased to 2500 mcg daily and, then to 1000 mcg daily >> every other day >> 2x a week - 11/2020 >> decreased to once a week 11/2021.  She continues to be in a wheelchair as her left leg is very weak-believed to be from a herniated intervertebral disc.  She had surgery  for this in 2016 but the strength did not improve. She was in the emergency room on 10/24/2020 for R wrist pain and swelling.  She also sees Dr. Unk Lightning.  No fracture. Has a brace. Her ulnar nerve is affected by using her home wheelchair. She was in the emergency room 11/28/2020 for chest pain.  Troponins were negative.  D-dimer was still elevated. She continues to have neck and back pain.   She has an extensive FH of heart disease - women in her family. Daughter died after her heart stopped.  ROS: + See HPI  I reviewed pt's medications, allergies, PMH, social hx, family hx, and changes were documented in the history of present illness. Otherwise, unchanged from my initial visit note.  Past Medical History:  Diagnosis Date   Elevated LFTs    Fatty liver    on [place].    Hyperlipemia    Hypertension    Inguinal hernia    2020 CT   Morbid obesity ([place]) 07/04/2015   Stage 3b chronic kidney disease ([place])    Thoracic spondylosis with myelopathy    Umbilical hernia    1093 CT   Uncontrolled type 2 diabetes with neuropathy    Wheelchair bound    paraplegia of Leg - Left   Past Surgical History:  Procedure Laterality Date   APPENDECTOMY  1969   BREAST BIOPSY Right    CATARACT EXTRACTION     May 21016   LUMBAR [place]     LUMBAR LAMINECTOMY/DECOMPRESSION MICRODISCECTOMY N/A 07/06/2015   Procedure: Thoracic ten- eleven laminectomy for spinal canal decompression;  Surgeon: Ashok Pall, MD;  Location: [place] [facility];  Service: Orthopedics;  Laterality: N/A;   Social History   Social History   Marital status: Divorced    Spouse name: N/A   Number of children: 0   Occupational History   None   Social History Main Topics   Smoking status: Never Smoker   Smokeless tobacco: Never Used   Alcohol use No   Drug use: No   Current Outpatient Medications on File Prior to Visit  Medication Sig Dispense Refill   ACCU-CHEK GUIDE test strip USE AS DIRECTED TO TEST BLOOD SUGAR FOUR TIMES  DAILY 400 strip 1   acetaminophen (TYLENOL) 650 MG CR tablet Take 650 mg by mouth every 8 (eight) hours as needed for pain.     amLODipine (NORVASC) 10 MG tablet Take 1 tablet (10 mg total) by mouth daily. 90 tablet 3   aspirin EC 81 MG tablet Take 81 mg by mouth every evening.     Cholecalciferol (VITAMIN D PO) Take 1,000 mg  by mouth daily.      Continuous Blood Gluc Receiver ([place]) DEVI by Does not apply route.     Cyanocobalamin (VITAMIN B 12 PO) Take 5,000 mg by mouth daily.     diclofenac Sodium (VOLTAREN) 1 % GEL Apply 4 g topically 4 (four) times daily. 100 g 0   dicyclomine (BENTYL) 10 MG capsule Take 1 capsule (10 mg total) by mouth every 8 (eight) hours as needed for spasms. 60 capsule 1   Dulaglutide (TRULICITY) 3 JM/4.2AS SOPN Inject 3 mg into the skin once a week. 6 mL 3   gabapentin (NEURONTIN) 300 MG capsule TAKE 1 CAPSULE(300 MG) BY MOUTH THREE TIMES DAILY 270 capsule 0   hydrochlorothiazide (MICROZIDE) 12.5 MG capsule Take 1 capsule (12.5 mg total) by mouth daily. 90 capsule 3   insulin lispro (HUMALOG) 100 UNIT/ML KwikPen INJECT 7-10 UNITS UNDER THE SKIN 2-3 TIMES DAILY 30 mL 3   Insulin Pen Needle (CAREFINE PEN NEEDLES) 32G X 4 MM MISC Use 4x a day 300 each 3   lovastatin (MEVACOR) 40 MG tablet Take 1 tablet (40 mg total) by mouth at bedtime. 90 tablet 3   metFORMIN (GLUCOPHAGE-XR) 500 MG 24 hr tablet TAKE 1 TABLET(500 MG) BY MOUTH TWICE DAILY 180 tablet 3   nitroGLYCERIN (NITROSTAT) 0.4 MG SL tablet Place 1 tablet (0.4 mg total) under the tongue every 5 (five) minutes as needed for chest pain. 25 tablet 3   polyethylene glycol (MIRALAX / GLYCOLAX) 17 g packet Take 17 g by mouth as needed.     TOUJEO SOLOSTAR 300 UNIT/ML Solostar Pen ADMINISTER 34-36 UNITS UNDER THE SKIN AT BEDTIME 12 mL 3   traMADol (ULTRAM) 50 MG tablet Take 1 tablet (50 mg total) by mouth every 12 (twelve) hours as needed. (Patient not taking: Reported on 03/06/2022) 60 tablet 2   No current  facility-administered medications on file prior to visit.   Allergies  Allergen Reactions   Other     PT PREFERS TO NOT HAVE ANY NARCOTIC MEDICATIONS   Chlorhexidine Rash    Burning after using CHG wipes-used for surgery   Oxycodone Hcl     Other reaction(s): Hallucination Marked hallucinations and palpitations following dose of 10mg  on 04/30/2015    Family History  Problem Relation Age of Onset   Diabetes Mother    Heart disease Mother    Pancreatic cancer Sister    Kidney disease Brother        Two brothers on ESRD   Amblyopia Neg Hx    Blindness Neg Hx    Cataracts Neg Hx    Glaucoma Neg Hx    Macular degeneration Neg Hx    Retinal detachment Neg Hx    Strabismus Neg Hx    Retinitis pigmentosa Neg Hx    Colon cancer Neg Hx    Esophageal cancer Neg Hx    Pt has FH of DM in M, MGM, PGM, M aunt, uncles.  PE: BP 118/72 (BP Location: Right Arm, Patient Position: Sitting, Cuff Size: Normal)   Pulse 71   Ht 5' 4.5" (1.638 m)   Wt 218 lb (98.9 kg)   SpO2 98%   BMI 36.84 kg/m  Wt Readings from Last 3 Encounters:  04/04/22 218 lb (98.9 kg)  12/07/21 212 lb (96.2 kg)  10/15/21 216 lb (98 kg)   Constitutional: overweight, in NAD, in wheelchair Eyes: EOMI, no exophthalmos ENT: no thyromegaly, no cervical lymphadenopathy Cardiovascular: RRR, No MRG, + L ankle edema -  pitting after fall 2017 Respiratory: CTA B Musculoskeletal: + paraplegia Skin: no rashes Neurological: no tremor with outstretched hands Diabetic Foot Exam - Simple   Simple Foot Form Diabetic Foot exam was performed with the following findings: Yes 04/04/2022 11:25 AM  Visual Inspection No deformities, no ulcerations, no other skin breakdown bilaterally: Yes See comments: Yes Sensation Testing Intact to touch and monofilament testing bilaterally: Yes Pulse Check Posterior Tibialis and Dorsalis pulse intact bilaterally: Yes Comments + Mild bilateral foot swelling L>R     ASSESSMENT: 1. DM2,  insulin-dependent, uncontrolled, with complications - CKD stage 3 - PN - DR  - Of note, we tried to prescribe the Freestyle libre CGM for her but this was not covered by the insurance  2. Low B12  3. HL  PLAN:  1. Patient with longstanding, uncontrolled, type 2 diabetes, on basal/bolus insulin regimen and also metformin and GLP-1 receptor agonist, with improved control.  At last visit, HbA1c was lower, at 7.3%.  At that time, sugars were dropping a little bit too much overnight with a nadir around 9 AM, but lasting from approximately 7 AM to 12 PM.  I advised her to reduce the Toujeo dose.  Sugars later in the day were better, but occasionally higher after lunch and less frequently, after dinner.  I advised him to take a higher amount of Humalog before larger meals. CGM interpretation: -At today's visit, we reviewed her CGM downloads: It appears that 56% of values are in target range (goal >70%), while 44% are higher than 180 (goal <25%), and 0% are lower than 70 (goal <4%).  The calculated average blood sugar is 182.  The projected HbA1c for the next 3 months (GMI) is 7.7%. -Reviewing the CGM trends, sugars appear to be much higher in the last 2 weeks, compared to previously.  Reviewing the blood sugars before the end of last month, they were almost entirely at goal, with a predicted HbA1c of 7.9% (please see HPI), however, sugars are now consistently elevated, mostly after breakfast.  They are better at night.  Upon questioning, she is eating sugary cereals in the morning and even though she is taking Humalog, she is taking a relatively lower dose.  We discussed about absolutely stopping the cereals, but if she continues with them, to inject more insulin before eating.  Also, she was off her sensors and had a lot of problems obtaining them from the supplier, and she mentions that she was very frustrated about the whole situation and had a lot of stress.  As of now, we are working on getting her back  on the CGM consistently (given samples today) and, as she is decided to eliminate the cereals >> I advised her to continue the current regimen - I suggested to:  Patient Instructions  Please continue: - Metformin ER 500 mg 1-2x a day with meals - Trulicity 3 mg weekly - Toujeo 34-36 units at night - Humalog 12-20 units before the meals (higher doses with lunch), and 7 units before the snack at night   Please continue B12 1000 mcg 1x a week.  Please return in 4 months.   - we checked her HbA1c: 7.5% (higher) - advised to check sugars at different times of the day - 4x a day, rotating check times - advised for yearly eye exams >> she is UTD -She has peripheral neuropathy-on Neurontin-refills per PCP - return to clinic in 3-4 months  2. Low B12 -He has a history of B12 deficiency,  for which we started supplementation with p.o. B12 -She is currently on 1000 mcg  -she was taking it twice a week at last visit, currently once a week -Reviewed latest B12 level: Lab Results  Component Value Date   VITAMINB12 >1500 (H) 11/28/2021   VITAMINB12 >1550 (H) 12/06/2020  -At last visit, vitamin B12 is still high, so I advised her to take the supplement only once a week -she is doing great now. -We will recheck the level at next visit  3. HL -Reviewed lipid  panel from 09/2021: Fractions at goal Lab Results  Component Value Date   CHOL 158 10/15/2021   HDL 72 10/15/2021   LDLCALC 62 10/15/2021   TRIG 143 10/15/2021   CHOLHDL 2.2 10/15/2021  -She continues Mevacor 40 mg daily without side effects  Carlus Pavlov, MD PhD [facility]

## 2022-05-03 ENCOUNTER — Telehealth: Payer: Self-pay | Admitting: Physical Medicine and Rehabilitation

## 2022-05-03 NOTE — Telephone Encounter (Signed)
Patient aware of suggestions.

## 2022-05-03 NOTE — Telephone Encounter (Signed)
OK- if need be, suggest she sees her PCP asap so they can xray her- or go to urgent care if she's hurting that badly- thanks- ML

## 2022-05-03 NOTE — Telephone Encounter (Signed)
Patient called to report she fell. She is having down her spine and right leg when she tries to stand. She is also having a lot of pain when transferring out of wheelchair. I have placed her on the wait list. Your first opening is 06/10/22.

## 2022-05-08 DIAGNOSIS — L98429 Non-pressure chronic ulcer of back with unspecified severity: Secondary | ICD-10-CM | POA: Diagnosis not present

## 2022-05-08 DIAGNOSIS — R262 Difficulty in walking, not elsewhere classified: Secondary | ICD-10-CM | POA: Diagnosis not present

## 2022-05-08 DIAGNOSIS — M4710 Other spondylosis with myelopathy, site unspecified: Secondary | ICD-10-CM | POA: Diagnosis not present

## 2022-05-08 DIAGNOSIS — G959 Disease of spinal cord, unspecified: Secondary | ICD-10-CM | POA: Diagnosis not present

## 2022-06-10 ENCOUNTER — Encounter: Payer: Self-pay | Admitting: Physical Medicine and Rehabilitation

## 2022-06-10 ENCOUNTER — Encounter: Payer: 59 | Attending: Physical Medicine and Rehabilitation | Admitting: Physical Medicine and Rehabilitation

## 2022-06-10 VITALS — BP 152/72 | HR 68 | Ht 64.5 in

## 2022-06-10 DIAGNOSIS — Z993 Dependence on wheelchair: Secondary | ICD-10-CM | POA: Insufficient documentation

## 2022-06-10 DIAGNOSIS — G8222 Paraplegia, incomplete: Secondary | ICD-10-CM | POA: Insufficient documentation

## 2022-06-10 DIAGNOSIS — M7918 Myalgia, other site: Secondary | ICD-10-CM | POA: Insufficient documentation

## 2022-06-10 MED ORDER — METHOCARBAMOL 500 MG PO TABS: 500.0000 mg | ORAL_TABLET | Freq: Three times a day (TID) | ORAL | 5 refills | Status: DC | PRN

## 2022-06-10 NOTE — Progress Notes (Signed)
Subjective:    Patient ID: Cassandra Stewart, female    DOB: 1950/10/13, 72 y.o.   MRN: [id number]  HPI Pt is a 72 yr old R handed female  With hx of HTN, DM- poorly controlled per chart; obesity- BMI >35, HLD, Vit B levels are high- with hx of thoracic spondylosis/myelopathy and LE weakness- mainly LLE weak.  Here for f/u on weakness. w/c dependent. In power w/c.  Also new R wrist pain- ulnar styloid pain/TTP Here for f/u on Thoracic nontraumatic myelopathy- incomplete paraplegia.  Also has carpal tunnel syndrome on R.    Had major fall since last seen. 1 month ago.  Joystick made chair jump- and was leaned over and chair jumped- and was wedged between bed and nightstand- for 45 minutes.   Hurt back really bad. Tried to call to be seen- but I wasn't able to see her.   EMTs required to get her off floor.   Feeling much better now- but back hurting most of day. More manageable now than at first.   Weakness now more in R leg now than did prior- now both legs are really weak.   Got new joystick on w/c.  Still has w/c making a weird noise- makers of chair and [employer] is coming out tomorrow- trying to figure out what it is- calling [employer]- going better- already replaced both motors.      Pain Inventory Average Pain 8 Pain Right Now 7 My pain is constant and aching  LOCATION OF PAIN  back & left ankle  BOWEL Number of stools per week: 3   BLADDER Normal    Mobility use a wheelchair transfers alone Do you have any goals in this area?  yes  Function disabled: date disabled forever retired Do you have any goals in this area?  yes  Neuro/Psych weakness numbness tingling trouble walking  Prior Studies Any changes since last visit?  no  Physicians involved in your care Any changes since last visit?  no   Family History  Problem Relation Age of Onset   Diabetes Mother    Heart disease Mother    Pancreatic cancer Sister    Kidney disease  Brother        Two brothers on ESRD   Amblyopia Neg Hx    Blindness Neg Hx    Cataracts Neg Hx    Glaucoma Neg Hx    Macular degeneration Neg Hx    Retinal detachment Neg Hx    Strabismus Neg Hx    Retinitis pigmentosa Neg Hx    Colon cancer Neg Hx    Esophageal cancer Neg Hx    Social History   Socioeconomic History   Marital status: Divorced    Spouse name: Not on file   Number of children: Not on file   Years of education: Not on file   Highest education level: Not on file  Occupational History   Not on file  Tobacco Use   Smoking status: Never   Smokeless tobacco: Never  Vaping Use   Vaping Use: Never used  Substance and Sexual Activity   Alcohol use: No   Drug use: No   Sexual activity: Not Currently  Other Topics Concern   Not on file  Social History Narrative   Not on file   Social Determinants of Health   Financial Resource Strain: Low Risk  (12/07/2021)   Overall Financial Resource Strain (CARDIA)    Difficulty of Paying Living Expenses: Not hard at  all  Food Insecurity: No Food Insecurity (12/07/2021)   Hunger Vital Sign    Worried About Running Out of Food in the Last Year: Never true    Ran Out of Food in the Last Year: Never true  Transportation Needs: No Transportation Needs (12/07/2021)   PRAPARE - Administrator, Civil Service (Medical): No    Lack of Transportation (Non-Medical): No  Physical Activity: Insufficiently Active (12/07/2021)   Exercise Vital Sign    Days of Exercise per Week: 2 days    Minutes of Exercise per Session: 20 min  Stress: No Stress Concern Present (12/07/2021)   [employer] of Occupational Health - Occupational Stress Questionnaire    Feeling of Stress : Only a little  Recent Concern: Stress - Stress Concern Present (12/07/2021)   [employer] of Occupational [employer] - Occupational Stress Questionnaire    Feeling of Stress : To some extent  Social Connections: Not on file   Past Surgical  History:  Procedure Laterality Date   APPENDECTOMY  1969   BREAST BIOPSY Right    CATARACT EXTRACTION     May 19147   LUMBAR DISC SURGERY     LUMBAR LAMINECTOMY/DECOMPRESSION MICRODISCECTOMY N/A 07/06/2015   Procedure: Thoracic ten- eleven laminectomy for spinal canal decompression;  Surgeon: Coletta Memos, MD;  Location: [facility];  Service: Orthopedics;  Laterality: N/A;   Past Medical History:  Diagnosis Date   Elevated LFTs    Fatty liver    on [place].    Hyperlipemia    Hypertension    Inguinal hernia    2020 CT   Morbid obesity 07/04/2015   Stage 3b chronic kidney disease    Thoracic spondylosis with myelopathy    Umbilical hernia    2020 CT   Uncontrolled type 2 diabetes with neuropathy    Wheelchair bound    paraplegia of Leg - Left   BP (!) 152/72   Pulse 68   Ht 5' 4.5" (1.638 m)   SpO2 95%   BMI 36.84 kg/m   Opioid Risk Score:   Fall Risk Score:  `1  Depression screen [facility] 2/9     06/10/2022   11:21 AM 12/07/2021    1:34 PM 10/15/2021   10:34 AM 07/30/2021   11:11 AM 04/20/2021   11:28 AM 01/12/2021   10:42 AM 10/16/2020   11:45 AM  Depression screen PHQ 2/9  Decreased Interest 0 1 0 0 3 0 0  Down, Depressed, Hopeless 0 1 0 0 3 0 0  PHQ - 2 Score 0 2 0 0 6 0 0  Altered sleeping  0       Tired, decreased energy  0       Change in appetite  0       Feeling bad or failure about yourself   0       Trouble concentrating  0       Moving slowly or fidgety/restless  0       Suicidal thoughts  0       PHQ-9 Score  2       Difficult doing work/chores  Somewhat difficult         Review of Systems  Cardiovascular:  Positive for leg swelling.       Left ankle swelling  Musculoskeletal:  Positive for gait problem.  Neurological:  Positive for weakness and numbness.  All other systems reviewed and are negative.      Objective:  Physical Exam  Awake, alert, appropriate, in power wc; weight stable, NAD  MS: RLE- 4/4 in same muscles- HF, KE, DF and PF LLE_  HF 2/5; KE 3+/5 DF 2+/5 and PF 3+/5 Muscle trigger points in scalenes, levators, upper traps, rhomboids and spine paraspinals all the way down very tight/TTP  Neuro: No spasticity on exam today- except 2-3 beats clonus in LE's- no increased tone     Assessment & Plan:   Pt is a 72 yr old R handed female  With hx of HTN, DM- poorly controlled per chart; obesity- BMI >35, HLD, Vit B levels are high- with hx of thoracic spondylosis/myelopathy and LE weakness- mainly LLE weak.  Here for f/u on weakness. w/c dependent. In power w/c.  Also new R wrist pain- ulnar styloid pain/TTP Here for f/u on Thoracic nontraumatic myelopathy- incomplete paraplegia.  Also has carpal tunnel syndrome on R.    We discussed that taking more than a couple of steps is unlikely with L hip weakness  2.  Took Tramadol but caused severe N/V-  couldn't tolerate it- will add to Allergy list.    3. Prefers to not be on opiates otherwise, so won't prescribe anything else.    4.  Robaxin/Methocarbamol- 500 mg up to 3x/day as needed- for muscle tightness- to loosen muscles somewhat-   5. Can look into doing trigger point injections at next visit if muscle relaxant isn't effective.   6. Discussed using tennis balls- and use to relax the muscles in back by pushing on back of w/c to get pressure against back. Hold pressure at least 2 minutes minimum.  7. F/U in 3 months- might do trP injections at f/u.    I spent a total of   21 minutes on total care today- >50% coordination of care- due to  discussion about pain, and trying to get controlled- discussed options as detailed above.

## 2022-06-10 NOTE — Patient Instructions (Signed)
Pt is a 72 yr old R handed female  With hx of HTN, DM- poorly controlled per chart; obesity- BMI >35, HLD, Vit B levels are high- with hx of thoracic spondylosis/myelopathy and LE weakness- mainly LLE weak.  Here for f/u on weakness. w/c dependent. In power w/c.  Also new R wrist pain- ulnar styloid pain/TTP Here for f/u on Thoracic nontraumatic myelopathy- incomplete paraplegia.  Also has carpal tunnel syndrome on R.    We discussed that taking more than a couple of steps is unlikely with L hip weakness  2.  Took Tramadol but caused severe N/V-  couldn't tolerate it- will add to Allergy list.    3. Prefers to not be on opiates otherwise, so won't prescribe anything else.    4.  Robaxin/Methocarbamol- 500 mg up to 3x/day as needed- for muscle tightness- to loosen muscles somewhat-   5. Can look into doing trigger point injections at next visit if muscle relaxant isn't effective.   6. Discussed using tennis balls- and use to relax the muscles in back by pushing on back of w/c to get pressure against back. Hold pressure at least 2 minutes minimum.  7. F/U in 3 months- might do trP injections at f/u.

## 2022-06-13 ENCOUNTER — Other Ambulatory Visit: Payer: Self-pay | Admitting: Physician Assistant

## 2022-06-13 DIAGNOSIS — E782 Mixed hyperlipidemia: Secondary | ICD-10-CM

## 2022-06-13 DIAGNOSIS — I10 Essential (primary) hypertension: Secondary | ICD-10-CM

## 2022-06-14 ENCOUNTER — Other Ambulatory Visit: Payer: Self-pay | Admitting: Family Medicine

## 2022-06-14 ENCOUNTER — Other Ambulatory Visit: Payer: Self-pay | Admitting: Nurse Practitioner

## 2022-06-14 DIAGNOSIS — I10 Essential (primary) hypertension: Secondary | ICD-10-CM

## 2022-06-14 NOTE — Telephone Encounter (Signed)
Refill request last apt 10/15/21 next apt 06/28/22.

## 2022-06-18 ENCOUNTER — Other Ambulatory Visit: Payer: Self-pay

## 2022-06-18 DIAGNOSIS — E1142 Type 2 diabetes mellitus with diabetic polyneuropathy: Secondary | ICD-10-CM

## 2022-06-18 MED ORDER — INSULIN LISPRO (1 UNIT DIAL) 100 UNIT/ML (KWIKPEN)
PEN_INJECTOR | SUBCUTANEOUS | 3 refills | Status: DC
Start: 2022-06-18 — End: 2022-08-21

## 2022-06-20 ENCOUNTER — Telehealth: Payer: Self-pay | Admitting: *Deleted

## 2022-06-20 NOTE — Telephone Encounter (Signed)
Patient states she was prescribed Methocarbamol at last visit 06/10/22. She is now experiencing heart flutters., though she has a history of flutters. She took 1st dose on 06/18/22 and has d/c. Please advise [phone].   Patient has been advised per Dr. Dahlia Client feedback.

## 2022-06-20 NOTE — Telephone Encounter (Signed)
Patient advised.

## 2022-06-28 ENCOUNTER — Encounter: Payer: Self-pay | Admitting: Nurse Practitioner

## 2022-06-28 ENCOUNTER — Ambulatory Visit (INDEPENDENT_AMBULATORY_CARE_PROVIDER_SITE_OTHER): Payer: 59 | Admitting: Nurse Practitioner

## 2022-06-28 VITALS — BP 124/78 | HR 67

## 2022-06-28 DIAGNOSIS — I700 Atherosclerosis of aorta: Secondary | ICD-10-CM | POA: Diagnosis not present

## 2022-06-28 DIAGNOSIS — E1159 Type 2 diabetes mellitus with other circulatory complications: Secondary | ICD-10-CM

## 2022-06-28 DIAGNOSIS — E538 Deficiency of other specified B group vitamins: Secondary | ICD-10-CM

## 2022-06-28 DIAGNOSIS — R1031 Right lower quadrant pain: Secondary | ICD-10-CM

## 2022-06-28 DIAGNOSIS — I152 Hypertension secondary to endocrine disorders: Secondary | ICD-10-CM | POA: Diagnosis not present

## 2022-06-28 DIAGNOSIS — N1832 Chronic kidney disease, stage 3b: Secondary | ICD-10-CM | POA: Diagnosis not present

## 2022-06-28 DIAGNOSIS — E785 Hyperlipidemia, unspecified: Secondary | ICD-10-CM

## 2022-06-28 DIAGNOSIS — R1032 Left lower quadrant pain: Secondary | ICD-10-CM | POA: Diagnosis not present

## 2022-06-28 DIAGNOSIS — E1169 Type 2 diabetes mellitus with other specified complication: Secondary | ICD-10-CM | POA: Diagnosis not present

## 2022-06-28 DIAGNOSIS — K760 Fatty (change of) liver, not elsewhere classified: Secondary | ICD-10-CM

## 2022-06-28 DIAGNOSIS — G8222 Paraplegia, incomplete: Secondary | ICD-10-CM | POA: Diagnosis not present

## 2022-06-28 DIAGNOSIS — E1121 Type 2 diabetes mellitus with diabetic nephropathy: Secondary | ICD-10-CM

## 2022-06-28 DIAGNOSIS — Z0000 Encounter for general adult medical examination without abnormal findings: Secondary | ICD-10-CM

## 2022-06-28 DIAGNOSIS — Z993 Dependence on wheelchair: Secondary | ICD-10-CM

## 2022-06-28 DIAGNOSIS — R7989 Other specified abnormal findings of blood chemistry: Secondary | ICD-10-CM

## 2022-06-28 NOTE — Progress Notes (Signed)
[name], DNP, AGNP-c [facility] [address] [place], [place] [address] Main Office [phone]  BP 124/78   Pulse 67    Subjective:    Patient ID: Cassandra Stewart, female    DOB: 1950-08-18, 72 y.o.   MRN: [id number]  HPI: Cassandra Stewart is a 72 y.o. female presenting on 06/28/2022 for comprehensive medical examination.   Current medical concerns include: The patient presents today with concerns regarding a recent issue with her motorized chair, which she relies on for mobility. She expresses concern about her inability to obtain insurance for the chair and requests assistance.  Additionally, the patient states that she has been without a primary care doctor for the past year. She is scheduled to see Dr. Vanessa Rylie on the 8th for a vision check.  She currently sees Dr. Elvera Lennox for endocrinology.  She expresses a history of lower abdominal cramping most likely associated with irritable bowel syndrome.  Dicyclomine has been effective in the past.  She is in need of refills today.  She reports taking vitamin B12 and vitamin D supplements daily.  Pertinent items are noted in HPI.  IMMUNIZATIONS:   Flu: Flu vaccine declined, patient does not wish to complete Prevnar 13: Prevnar 13 declined, patient does not wish to have this vaccine Prevnar 20: Prevnar 20 declined, patient does not wish to have this vaccine Pneumovax 23: Pneumovax declined, patient does not wish to have this vaccine Vac Shingrix: Shingrix declined, patient does not wish to have this vaccine HPV: HPV N/A for this patient Tetanus: Tetanus declined, patient does not wish to have this vaccine COVID: COVID declined, patient does not wish to have this vaccine  HEALTH MAINTENANCE: Pap Smear HM Status: is not applicable for this patient Mammogram HM Status: was declined  Colon Cancer Screening HM Status: is up to date and is reported as completed, but records are needed- cologuard Bone Density  HM Status: is up to date STI Testing HM Status: is not applicable for this patient Lung CT HM Status: is not applicable for this patient  She reports regular vision exams q1-5y: Yes  She reports regular dental exams q 19m:  Yes  The patient eats a regular, healthy diet. She endorses exercise and/or activity of: unable   Most Recent Depression Screen:     06/28/2022   11:14 AM 06/10/2022   11:21 AM 12/07/2021    1:34 PM 10/15/2021   10:34 AM 07/30/2021   11:11 AM  Depression screen PHQ 2/9  Decreased Interest 0 0 1 0 0  Down, Depressed, Hopeless 1 0 1 0 0  PHQ - 2 Score 1 0 2 0 0  Altered sleeping   0    Tired, decreased energy   0    Change in appetite   0    Feeling bad or failure about yourself    0    Trouble concentrating   0    Moving slowly or fidgety/restless   0    Suicidal thoughts   0    PHQ-9 Score   2    Difficult doing work/chores   Somewhat difficult     Most Recent Anxiety Screen:      No data to display         Most Recent Fall Screen:    06/28/2022   11:13 AM 06/10/2022   11:21 AM 03/06/2022   10:53 AM 12/07/2021    1:33 PM 10/15/2021   10:34 AM  Fall Risk   Falls in the  past year? 1 0 0 0 0  Number falls in past yr: 0 0  0 0  Injury with Fall? 1 0  0 0  Comment fell on knees rt. leg a ittle weak,      Risk for fall due to : Other (Comment)   Impaired mobility;Impaired balance/gait;Medication side effect No Fall Risks  Follow up Falls evaluation completed   Falls prevention discussed;Education provided;Falls evaluation completed Falls evaluation completed    Past medical history, surgical history, medications, allergies, family history and social history reviewed with patient today and changes made to appropriate areas of the chart.  Past Medical History:  Past Medical History:  Diagnosis Date   Abdominal pain, chronic, left lower quadrant 08/25/2018   Elevated LFTs    Family history of heart disease 12/04/2018   Fatty liver    on [place].     Hyperlipemia    Hypertension    Inguinal hernia    2020 CT   Morbid obesity (HCC) 07/04/2015   Numbness and tingling in right hand 03/01/2021   Palpitation 03/08/2021   Stage 3b chronic kidney disease (HCC)    Thoracic spondylosis with myelopathy    Umbilical hernia    2020 CT   Uncontrolled type 2 diabetes with neuropathy    Weakness of left leg 07/04/2015   Wheelchair bound    paraplegia of Leg - Left   Medications:  Current Outpatient Medications on File Prior to Visit  Medication Sig   ACCU-CHEK GUIDE test strip USE AS DIRECTED TO TEST BLOOD SUGAR FOUR TIMES DAILY   acetaminophen (TYLENOL) 650 MG CR tablet Take 650 mg by mouth every 8 (eight) hours as needed for pain.   amLODipine (NORVASC) 10 MG tablet TAKE 1 TABLET(10 MG) BY MOUTH DAILY   aspirin EC 81 MG tablet Take 81 mg by mouth every evening.   Cholecalciferol (VITAMIN D PO) Take 1,000 mg by mouth daily.    Continuous Blood Gluc Receiver (DEXCOM G6 RECEIVER) DEVI by Does not apply route.   Cyanocobalamin (VITAMIN B 12 PO) Take 5,000 mg by mouth daily.   diclofenac Sodium (VOLTAREN) 1 % GEL Apply 4 g topically 4 (four) times daily.   gabapentin (NEURONTIN) 300 MG capsule TAKE 1 CAPSULE(300 MG) BY MOUTH THREE TIMES DAILY   hydrochlorothiazide (MICROZIDE) 12.5 MG capsule TAKE 1 CAPSULE(12.5 MG) BY MOUTH DAILY   insulin lispro (HUMALOG) 100 UNIT/ML KwikPen INJECT 7-10 UNITS UNDER THE SKIN 2-3 TIMES DAILY   Insulin Pen Needle (CAREFINE PEN NEEDLES) 32G X 4 MM MISC Use 4x a day   lovastatin (MEVACOR) 40 MG tablet TAKE 1 TABLET(40 MG) BY MOUTH AT BEDTIME   metFORMIN (GLUCOPHAGE-XR) 500 MG 24 hr tablet TAKE 1 TABLET(500 MG) BY MOUTH TWICE DAILY   methocarbamol (ROBAXIN) 500 MG tablet Take 1 tablet (500 mg total) by mouth every 8 (eight) hours as needed for muscle spasms.   polyethylene glycol (MIRALAX / GLYCOLAX) 17 g packet Take 17 g by mouth as needed.   TOUJEO SOLOSTAR 300 UNIT/ML Solostar Pen ADMINISTER 34-36 UNITS UNDER THE  SKIN AT BEDTIME   nitroGLYCERIN (NITROSTAT) 0.4 MG SL tablet Place 1 tablet (0.4 mg total) under the tongue every 5 (five) minutes as needed for chest pain. (Patient not taking: Reported on 06/28/2022)   No current facility-administered medications on file prior to visit.   Surgical History:  Past Surgical History:  Procedure Laterality Date   APPENDECTOMY  1969   BREAST BIOPSY Right    CATARACT EXTRACTION  May 16109   LUMBAR DISC SURGERY     LUMBAR LAMINECTOMY/DECOMPRESSION MICRODISCECTOMY N/A 07/06/2015   Procedure: Thoracic ten- eleven laminectomy for spinal canal decompression;  Surgeon: Coletta Memos, MD;  Location: [facility];  Service: Orthopedics;  Laterality: N/A;   Allergies:  Allergies  Allergen Reactions   Tramadol Nausea And Vomiting    Severe N/V- with Tramadol   Other     PT PREFERS TO NOT HAVE ANY NARCOTIC MEDICATIONS   Chlorhexidine Rash    Burning after using CHG wipes-used for surgery   Oxycodone Hcl     Other reaction(s): Hallucination Marked hallucinations and palpitations following dose of 10mg  on 04/30/2015    Family History:  Family History  Problem Relation Age of Onset   Diabetes Mother    Heart disease Mother    Pancreatic cancer Sister    Kidney disease Brother        Two brothers on ESRD   Amblyopia Neg Hx    Blindness Neg Hx    Cataracts Neg Hx    Glaucoma Neg Hx    Macular degeneration Neg Hx    Retinal detachment Neg Hx    Strabismus Neg Hx    Retinitis pigmentosa Neg Hx    Colon cancer Neg Hx    Esophageal cancer Neg Hx        Objective:    BP 124/78   Pulse 67   Wt Readings from Last 3 Encounters:  04/04/22 218 lb (98.9 kg)  12/07/21 212 lb (96.2 kg)  10/15/21 216 lb (98 kg)    Physical Exam Vitals and nursing note reviewed.  Constitutional:      Appearance: She is obese.  HENT:     Head: Normocephalic.     Right Ear: Tympanic membrane normal.     Left Ear: Tympanic membrane normal.     Nose: Nose normal.      Mouth/Throat:     Mouth: Mucous membranes are moist.     Pharynx: Oropharynx is clear.  Eyes:     Pupils: Pupils are equal, round, and reactive to light.  Neck:     Vascular: No carotid bruit.  Cardiovascular:     Rate and Rhythm: Normal rate and regular rhythm.     Pulses: Normal pulses.     Heart sounds: Normal heart sounds.  Pulmonary:     Effort: Pulmonary effort is normal.     Breath sounds: Normal breath sounds.  Abdominal:     General: Bowel sounds are normal.     Palpations: Abdomen is soft.  Musculoskeletal:     Cervical back: No tenderness.     Right lower leg: Edema present.     Left lower leg: Edema present.  Lymphadenopathy:     Cervical: No cervical adenopathy.  Skin:    General: Skin is warm and dry.  Neurological:     General: No focal deficit present.     Mental Status: She is alert and oriented to person, place, and time.     Sensory: Sensory deficit present.     Comments: Wheelchair-bound.  Psychiatric:        Mood and Affect: Mood normal.     Results for orders placed or performed in visit on 04/04/22  POCT glycosylated hemoglobin (Hb A1C)  Result Value Ref Range   Hemoglobin A1C 7.5 (A) 4.0 - 5.6 %   HbA1c POC (<> result, manual entry)     HbA1c, POC (prediabetic range)     HbA1c, POC (  controlled diabetic range)           Assessment & Plan:   Problem List Items Addressed This Visit     Hypertension associated with type 2 diabetes mellitus (HCC)    Chronic hypertension in the setting of type 2 diabetes, hyperlipidemia anemia, chronic kidney disease, obesity, and cardiovascular disease.  Strict blood pressure control is recommended for optimal management and prevention of worsening conditions.  She is currently managed with amlodipine and hydrochlorothiazide with excellent blood pressure control today. Plan: Labs pending today. - Continue current medication regimen.      Elevated LFTs    Labs pending today for evaluation.      CKD  (chronic kidney disease) stage 3, GFR 30-59 ml/min (HCC)    Chronic kidney disease in the setting of diabetes, hypertension, hyperlipidemia, and cardiovascular disease.  Strict blood sugar, blood pressure, and dietary restrictions are strongly encouraged to reduce risk of exacerbation or worsening of current symptoms.  Plan: - Labs pending today - Continue current medications and lifestyle management.      Fatty liver disease, nonalcoholic    Chronic fatty liver disease in the setting of hypertension, hyperlipidemia, diabetes, chronic kidney disease, and coronary artery disease.  Diet and exercise recommendations discussed.  Strict avoidance of medications that are harmful on the liver to prevent exacerbation or worsening symptoms.      Low serum vitamin B12    Chronic.  Labs pending today.  Currently on replacement therapy orally. Plan: - Continue current oral replacement. - Will make changes to plan of care as necessary based on lab findings.      Bilateral lower abdominal cramping    Lower abdominal cramping most likely associated with irritable bowel syndrome.  Bentyl has been effective for management in the past. Plan: - Prescribe Bentyl as needed 3 times a day with meals for control. - If no improvement of symptoms with medication referral to GI for further evaluation recommended.      Wheelchair dependence    Chronic wheelchair dependence in the setting of lower extremity paraplegia.  Movement limitations impeded ability for routine exercise.  She may benefit from aqua therapy to aid in physical activity.  We can consider this if the patient is willing.      Hyperlipidemia associated with type 2 diabetes mellitus (HCC)    Chronic hyperlipidemia in the setting of type 2 diabetes, hypertension, obesity, chronic kidney disease, cardiovascular disease, and fatty liver disease.  Currently on lovastatin for management.  She is also taking Trulicity for blood sugar control which may also  aid in control. Plan: - Continue current medication regimen. - Labs pending today -Review of most recent labs from August showed adequate control. -Will make changes to plan of care as necessary based on lab findings.      Atherosclerosis of aorta (HCC)    Chronic atherosclerosis detected on imaging in the setting of hypertension, hyperlipidemia, and diabetes.  Strict management with statin therapy recommended as well as dietary restrictions of cholesterol and fat for optimal control.  Labs pending today.      Chronic incomplete paraplegia (HCC)    Chronic paraplegia of lower extremities with reliance on wheelchair for mobility.  She is having concerns with her motorized wheelchair, which is essential for her mobility. Plan: - Encourage patient contact the wheelchair manufacturer supplier for assistance with the loose bolts. - If replacement is needed we will be happy to send prescription for new wheelchair based on insurance coverage.  Type 2 diabetes mellitus with diabetic nephropathy (HCC)    Chronic type 2 diabetes in the setting of hypertension, hyperlipidemia, fatty liver disease, cardiovascular disease, and chronic kidney disease.  Labs from August of last year showed good control.  She is currently managed with Trulicity, Humalog, metformin, Toujeo, and utilization of Dexcom. Plan: - Labs pending today. - Continue current medications.  Based on labs changes will be made as appropriate. - Dietary recommendations reviewed.       Class 2 severe obesity due to excess calories with serious comorbidity and body mass index (BMI) of 36.0 to 36.9 in adult  [facility])    Chronic obesity in the setting of type II diabetes, paraplegia, hypertension, hyperlipidemia, and cardiovascular disease.  Unfortunately the patient is limited in her ability to exercise appropriately.  Recommend dietary management and upper arm exercises to help with physical activity. Plan: - Labs pending today. -  Consider therapy resources such as aqua therapy which may help with physical activity component in the setting of mobility limitations.       Encounter for annual physical exam - Primary    CPE today with no abnormalities noted on exam.  Labs pending. Will make changes as necessary based on results.  Review of HM activities and recommendations discussed and provided on AVS Anticipatory guidance, diet, and exercise recommendations provided.  Medications, allergies, and hx reviewed and updated as necessary.  Plan to f/u with CPE in 1 year or sooner for acute/chronic health needs as directed.            Follow up plan: Return in about 4 months (around 10/29/2022) for chronic.  NEXT PREVENTATIVE PHYSICAL DUE IN 1 YEAR.  PATIENT COUNSELING PROVIDED FOR ALL ADULT PATIENTS: A well balanced diet low in saturated fats, cholesterol, and moderation in carbohydrates.  This can be as simple as monitoring portion sizes and cutting back on sugary beverages such as soda and juice to start with.    Daily water consumption of at least 64 ounces.  Physical activity at least 180 minutes per week.  If just starting out, start 10 minutes a day and work your way up.   This can be as simple as taking the stairs instead of the elevator and walking 2-3 laps around the office  purposefully every day.   STD protection, partner selection, and regular testing if high risk.  Limited consumption of alcoholic beverages if alcohol is consumed. For men, I recommend no more than 14 alcoholic beverages per week, spread out throughout the week (max 2 per day). Avoid "binge" drinking or consuming large quantities of alcohol in one setting.  Please let me know if you feel you may need help with reduction or quitting alcohol consumption.   Avoidance of nicotine, if used. Please let me know if you feel you may need help with reduction or quitting nicotine use.   Daily mental health attention. This can be in the form  of 5 minute daily meditation, prayer, journaling, yoga, reflection, etc.  Purposeful attention to your emotions and mental state can significantly improve your overall wellbeing  and  Health.  Please know that I am here to help you with all of your health care goals and am happy to work with you to find a solution that works best for you.  The greatest advice I have received with any changes in life are to take it one step at a time, that even means if all you can focus on is the next 60  seconds, then do that and celebrate your victories.  With any changes in life, you will have set backs, and that is OK. The important thing to remember is, if you have a set back, it is not a failure, it is an opportunity to try again! Screening Testing Mammogram Every 1 -2 years based on history and risk factors Starting at age 30 Pap Smear Ages 21-39 every 3 years Ages 74-65 every 5 years with HPV testing More frequent testing may be required based on results and history Colon Cancer Screening Every 1-10 years based on test performed, risk factors, and history Starting at age 62 Bone Density Screening Every 2-10 years based on history Starting at age 55 for women Recommendations for men differ based on medication usage, history, and risk factors AAA Screening One time ultrasound Men 61-12 years old who have every smoked Lung Cancer Screening Low Dose Lung CT every 12 months Age 74-80 years with a 30 pack-year smoking history who still smoke or who have quit within the last 15 years   Screening Labs Routine  Labs: Complete Blood Count (CBC), Complete Metabolic Panel (CMP), Cholesterol (Lipid Panel) Every 6-12 months based on history and medications May be recommended more frequently based on current conditions or previous results Hemoglobin A1c Lab Every 3-12 months based on history and previous results Starting at age 28 or earlier with diagnosis of diabetes, high cholesterol, BMI >26, and/or  risk factors Frequent monitoring for patients with diabetes to ensure blood sugar control Thyroid Panel (TSH) Every 6 months based on history, symptoms, and risk factors May be repeated more often if on medication HIV One time testing for all patients 46 and older May be repeated more frequently for patients with increased risk factors or exposure Hepatitis C One time testing for all patients 58 and older May be repeated more frequently for patients with increased risk factors or exposure Gonorrhea, Chlamydia Every 12 months for all sexually active persons 13-24 years Additional monitoring may be recommended for those who are considered high risk or who have symptoms Every 12 months for any woman on birth control, regardless of sexual activity PSA Men 40-50 years old with risk factors Additional screening may be recommended from age 84-69 based on risk factors, symptoms, and history  Vaccine Recommendations Tetanus Booster All adults every 10 years Flu Vaccine All patients 6 months and older every year COVID Vaccine All patients 12 years and older Initial dosing with booster May recommend additional booster based on age and health history HPV Vaccine 2 doses all patients age 60-26 Dosing may be considered for patients over 26 Shingles Vaccine (Shingrix) 2 doses all adults 55 years and older Pneumonia (Pneumovax 32) All adults 65 years and older May recommend earlier dosing based on health history One year apart from Prevnar 37 Pneumonia (Prevnar 68) All adults 65 years and older Dosed 1 year after Pneumovax 23 Pneumonia (Prevnar 20) One time alternative to the two dosing of 13 and 23 For all adults with initial dose of 23, 20 is recommended 1 year later For all adults with initial dose of 13, 23 is still recommended as second option 1 year later

## 2022-07-02 MED ORDER — DICYCLOMINE HCL 10 MG PO CAPS: 10.0000 mg | ORAL_CAPSULE | Freq: Three times a day (TID) | ORAL | 3 refills | Status: DC

## 2022-07-03 ENCOUNTER — Encounter (INDEPENDENT_AMBULATORY_CARE_PROVIDER_SITE_OTHER): Payer: 59 | Admitting: Ophthalmology

## 2022-07-04 ENCOUNTER — Telehealth: Payer: Self-pay

## 2022-07-04 NOTE — Telephone Encounter (Signed)
Inbound fax from DME supplier requesting form be completed and faxed with clinical notes. DME supplies ordered via [employer] through online portal. [employer] G7

## 2022-07-05 DIAGNOSIS — Z794 Long term (current) use of insulin: Secondary | ICD-10-CM | POA: Diagnosis not present

## 2022-07-05 DIAGNOSIS — E118 Type 2 diabetes mellitus with unspecified complications: Secondary | ICD-10-CM | POA: Diagnosis not present

## 2022-07-08 NOTE — Progress Notes (Shared)
[facility] - Clinic Note  07/10/2022     CHIEF COMPLAINT Patient presents for Retina Follow Up   HISTORY OF PRESENT ILLNESS: Cassandra Stewart is a 72 y.o. female who presents to the clinic today for:   HPI     Retina Follow Up   In both eyes.  This started 1 year ago.  Duration of 1 year.  Since onset it is stable.  I, the attending physician,  performed the HPI with the patient and updated documentation appropriately.        Comments   1 year retina follow up DM exam pt is reporting no vision changes noticed she is having floaters but denies any flashes of light her last reading 113 A1C 7.1      Last edited by Rennis Chris, MD on 07/10/2022  4:15 PM.    Pt states the vision in her left eye is a little blurry, her A1c was 7.5 on 02.08.24, she states she has constant pain on her left side, but she doesn't know what's causing it  Referring physician: Jake Shark, PA-C No address on file  HISTORICAL INFORMATION:   Selected notes from the MEDICAL RECORD NUMBER Referred by V. Suezanne Jacquet, NP-C for DM exam LEE-  Ocular Hx- pseudophakia OU (2016, Dr. Thedore Mins at [facility]) PMH- DM2 on insulin, HTN,     CURRENT MEDICATIONS: No current outpatient medications on file. (Ophthalmic Drugs)   No current facility-administered medications for this visit. (Ophthalmic Drugs)   Current Outpatient Medications (Other)  Medication Sig   ACCU-CHEK GUIDE test strip USE AS DIRECTED TO TEST BLOOD SUGAR FOUR TIMES DAILY   acetaminophen (TYLENOL) 650 MG CR tablet Take 650 mg by mouth every 8 (eight) hours as needed for pain.   amLODipine (NORVASC) 10 MG tablet TAKE 1 TABLET(10 MG) BY MOUTH DAILY   aspirin EC 81 MG tablet Take 81 mg by mouth every evening.   Cholecalciferol (VITAMIN D PO) Take 1,000 mg by mouth daily.    Continuous Blood Gluc Receiver (DEXCOM G6 RECEIVER) DEVI by Does not apply route.   Cyanocobalamin (VITAMIN B 12 PO) Take 5,000 mg by mouth daily.    diclofenac Sodium (VOLTAREN) 1 % GEL Apply 4 g topically 4 (four) times daily.   dicyclomine (BENTYL) 10 MG capsule Take 1 capsule (10 mg total) by mouth 3 (three) times daily before meals.   Dulaglutide (TRULICITY) 3 MG/0.5ML SOPN Inject 3 mg into the skin once a week.   gabapentin (NEURONTIN) 300 MG capsule TAKE 1 CAPSULE(300 MG) BY MOUTH THREE TIMES DAILY   hydrochlorothiazide (MICROZIDE) 12.5 MG capsule TAKE 1 CAPSULE(12.5 MG) BY MOUTH DAILY   insulin lispro (HUMALOG) 100 UNIT/ML KwikPen INJECT 7-10 UNITS UNDER THE SKIN 2-3 TIMES DAILY   Insulin Pen Needle (CAREFINE PEN NEEDLES) 32G X 4 MM MISC Use 4x a day   lovastatin (MEVACOR) 40 MG tablet TAKE 1 TABLET(40 MG) BY MOUTH AT BEDTIME   metFORMIN (GLUCOPHAGE-XR) 500 MG 24 hr tablet TAKE 1 TABLET(500 MG) BY MOUTH TWICE DAILY   methocarbamol (ROBAXIN) 500 MG tablet Take 1 tablet (500 mg total) by mouth every 8 (eight) hours as needed for muscle spasms.   nitroGLYCERIN (NITROSTAT) 0.4 MG SL tablet Place 1 tablet (0.4 mg total) under the tongue every 5 (five) minutes as needed for chest pain. (Patient not taking: Reported on 06/28/2022)   polyethylene glycol (MIRALAX / GLYCOLAX) 17 g packet Take 17 g by mouth as needed.   TOUJEO  SOLOSTAR 300 UNIT/ML Solostar Pen ADMINISTER 34-36 UNITS UNDER THE SKIN AT BEDTIME   No current facility-administered medications for this visit. (Other)   REVIEW OF SYSTEMS: ROS   Positive for: Musculoskeletal, Endocrine, Eyes Negative for: Constitutional, Gastrointestinal, Neurological, Skin, Genitourinary, HENT, Cardiovascular, Respiratory, Psychiatric, Allergic/Imm, Heme/Lymph Last edited by Etheleen Mayhew, COT on 07/10/2022  2:02 PM.     ALLERGIES Allergies  Allergen Reactions   Tramadol Nausea And Vomiting    Severe N/V- with Tramadol   Other     PT PREFERS TO NOT HAVE ANY NARCOTIC MEDICATIONS   Chlorhexidine Rash    Burning after using CHG wipes-used for surgery   Oxycodone Hcl     Other  reaction(s): Hallucination Marked hallucinations and palpitations following dose of 10mg  on 04/30/2015    PAST MEDICAL HISTORY Past Medical History:  Diagnosis Date   Elevated LFTs    Fatty liver    on [place].    Hyperlipemia    Hypertension    Inguinal hernia    2020 CT   Morbid obesity (HCC) 07/04/2015   Stage 3b chronic kidney disease (HCC)    Thoracic spondylosis with myelopathy    Umbilical hernia    2020 CT   Uncontrolled type 2 diabetes with neuropathy    Wheelchair bound    paraplegia of Leg - Left   Past Surgical History:  Procedure Laterality Date   APPENDECTOMY  1969   BREAST BIOPSY Right    CATARACT EXTRACTION     May [id number]   LUMBAR DISC SURGERY     LUMBAR LAMINECTOMY/DECOMPRESSION MICRODISCECTOMY N/A 07/06/2015   Procedure: Thoracic ten- eleven laminectomy for spinal canal decompression;  Surgeon: Coletta Memos, MD;  Location: [facility];  Service: Orthopedics;  Laterality: N/A;   FAMILY HISTORY Family History  Problem Relation Age of Onset   Diabetes Mother    Heart disease Mother    Pancreatic cancer Sister    Kidney disease Brother        Two brothers on ESRD   Amblyopia Neg Hx    Blindness Neg Hx    Cataracts Neg Hx    Glaucoma Neg Hx    Macular degeneration Neg Hx    Retinal detachment Neg Hx    Strabismus Neg Hx    Retinitis pigmentosa Neg Hx    Colon cancer Neg Hx    Esophageal cancer Neg Hx    SOCIAL HISTORY Social History   Tobacco Use   Smoking status: Never   Smokeless tobacco: Never  Vaping Use   Vaping Use: Never used  Substance Use Topics   Alcohol use: No   Drug use: No       OPHTHALMIC EXAM:  Base Eye Exam     Visual Acuity (Snellen - Linear)       Right Left   Dist [place] 20/25 -1 20/25 -1   Dist ph [place] NI          Tonometry (Tonopen, 2:04 PM)       Right Left   Pressure 20 18         Pupils       Pupils Dark Light Shape React APD   Right PERRL 2 1 Round Brisk None   Left PERRL 2 1 Round Brisk None          Visual Fields       Left Right    Full Full         Extraocular Movement  Right Left    Full, Ortho Full, Ortho         Neuro/Psych     Oriented x3: Yes   Mood/Affect: Normal         Dilation     Both eyes: 2.5% Phenylephrine @ 2:05 PM           Slit Lamp and Fundus Exam     External Exam       Right Left   External Normal Normal         Slit Lamp Exam       Right Left   Lids/Lashes Dermatochalasis - upper lid Dermatochalasis - upper lid   Conjunctiva/Sclera mild melanosis Mild Melanosis   Cornea mild arcus, well healed cataract wound, trace tear film debris arcus, 2-3+ Punctate epithelial erosions, well healed cataract wound   Anterior Chamber deep and clear Deep and quiet   Iris Round and well dilated, No NVI Round and dilated, No NVI   Lens PC IOL in good postion, trace inferior Posterior capsular opacification PC IOL in good position with open PC   Anterior Vitreous Vitreous syneresis, Posterior vitreous detachment Vitreous syneresis, Posterior vitreous detachment         Fundus Exam       Right Left   Disc Pink and Sharp Mildly Tilted disc, Pink and Sharp   C/D Ratio 0.65 0.6   Macula Flat, Good foveal reflex, mild RPE mottling, rare DBH inferior mac; no frank edema Flat, Good foveal reflex, mild RPE mottling, rare MA, no edema   Vessels Mild Vascular attenuation attenuated, mild tortuosity   Periphery Attached; scattered MA/DBH mostly posterior Attached; rare scattered MA/DBH mostly posterior           IMAGING AND PROCEDURES  Imaging and Procedures for 06/09/17  OCT, Retina - OU - Both Eyes       Right Eye Quality was good. Central Foveal Thickness: 266. Progression has been stable. Findings include normal foveal contour, no SRF, myopic contour, intraretinal hyper-reflective material, intraretinal fluid (Tr cystic changes / IRHM nasal macula).   Left Eye Quality was good. Central Foveal Thickness: 282. Progression has  been stable. Findings include normal foveal contour, no IRF, no SRF, myopic contour (No DME).   Notes *Images captured and stored on drive  Diagnosis / Impression:  NFP, No IRF/SRF OU OD: Tr cystic changes / IRHM nasal macula No frank DME OU  Clinical management:  See below  Abbreviations: NFP - Normal foveal profile. CME - cystoid macular edema. PED - pigment epithelial detachment. IRF - intraretinal fluid. SRF - subretinal fluid. EZ - ellipsoid zone. ERM - epiretinal membrane. ORA - outer retinal atrophy. ORT - outer retinal tubulation. SRHM - subretinal hyper-reflective material             ASSESSMENT/PLAN:    ICD-10-CM   1. Mild nonproliferative diabetic retinopathy of both eyes without macular edema associated with type 2 diabetes mellitus (HCC)  E11.3293 OCT, Retina - OU - Both Eyes    2. Long term (current) use of oral hypoglycemic drugs  Z79.84     3. Current use of insulin (HCC)  Z79.4     4. 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 Essential hypertension  I10     6. Hypertensive retinopathy of both eyes  H35.033     7. Posterior vitreous detachment of both eyes  H43.813     8. Pseudophakia of both eyes  Z96.1  9. PCO (posterior capsular opacification), left  H26.492      1-4. DM2 w/ mild non-proliferative diabetic retinopathy, OU  - last A1c 7.5 on 02.08.24, 8.5 on 2.13.23  - exam with scattered MAs and improved CWSs OU  - OCT without clinically significant diabetic macular edema OU; OD with trace cystic changes / IRHM nasal macula  - BCVA 20/25 OU  - f/u in 1 yr -- repeat DFE, OCT  5,6. Hypertensive retinopathy OU - mild  - discussed importance of tight BP control  - monitor   7. PVD / vitreous syneresis OU  - asymptomatic  - Discussed findings and prognosis  - No RT or RD on 360 peripheral exam  - Reviewed s/s of RT/RD  - strict return precautions for any such RT/RD symptoms  8. Pseudophakia OU  - s/p CE/IOL in  2016, [facility], Dr. Thedore Mins  - beautiful surgeries, doing well  - monitor   9. PCO OU  - s/p YAG OS (07.14.21)  - good opening, BCVA improved to 20/20 OS  - monitor    Ophthalmic Meds Ordered this visit:  No orders of the defined types were placed in this encounter.    Return in about 1 year (around 07/10/2023) for f/u NPDR OU, DFE, OCT.  There are no Patient Instructions on file for this visit.   Explained the diagnoses, plan, and follow up with the patient and they expressed understanding.  Patient expressed understanding of the importance of proper follow up care.   This document serves as a record of services personally performed by Karie Chimera, MD, PhD. It was created on their behalf by De Blanch, an ophthalmic technician. The creation of this record is the provider's dictation and/or activities during the visit.    Electronically signed by: De Blanch, OA, 07/10/22  4:15 PM  This document serves as a record of services personally performed by Karie Chimera, MD, PhD. It was created on their behalf by Glee Arvin. Manson Passey, OA an ophthalmic technician. The creation of this record is the provider's dictation and/or activities during the visit.    Electronically signed by: Glee Arvin. Manson Passey, [place] 05.15.2024 4:15 PM  Karie Chimera, M.D., Ph.D. Diseases & Surgery of the Retina and Vitreous [facility]  I have reviewed the above documentation for accuracy and completeness, and I agree with the above. Karie Chimera, M.D., Ph.D. 07/10/22 4:15 PM   Abbreviations: M myopia (nearsighted); A astigmatism; H hyperopia (farsighted); P presbyopia; Mrx spectacle prescription;  CTL contact lenses; OD right eye; OS left eye; OU both eyes  XT exotropia; ET esotropia; PEK punctate epithelial keratitis; PEE punctate epithelial erosions; DES dry eye syndrome; MGD meibomian gland dysfunction; ATs artificial tears; PFAT's preservative free artificial tears;  NSC nuclear sclerotic cataract; PSC posterior subcapsular cataract; ERM epi-retinal membrane; PVD posterior vitreous detachment; RD retinal detachment; DM diabetes mellitus; DR diabetic retinopathy; NPDR non-proliferative diabetic retinopathy; PDR proliferative diabetic retinopathy; CSME clinically significant macular edema; DME diabetic macular edema; dbh dot blot hemorrhages; CWS cotton wool spot; POAG primary open angle glaucoma; C/D cup-to-disc ratio; HVF humphrey visual field; GVF goldmann visual field; OCT optical coherence tomography; IOP intraocular pressure; BRVO Branch retinal vein occlusion; CRVO central retinal vein occlusion; CRAO central retinal artery occlusion; BRAO branch retinal artery occlusion; RT retinal tear; SB scleral buckle; PPV pars plana vitrectomy; VH Vitreous hemorrhage; PRP panretinal laser photocoagulation; IVK intravitreal kenalog; VMT vitreomacular traction; MH Macular hole;  NVD neovascularization of  the disc; NVE neovascularization elsewhere; AREDS age related eye disease study; ARMD age related macular degeneration; POAG primary open angle glaucoma; EBMD epithelial/anterior basement membrane dystrophy; ACIOL anterior chamber intraocular lens; IOL intraocular lens; PCIOL posterior chamber intraocular lens; Phaco/IOL phacoemulsification with intraocular lens placement; [place] photorefractive keratectomy; LASIK laser assisted in situ keratomileusis; HTN hypertension; DM diabetes mellitus; COPD chronic obstructive pulmonary disease

## 2022-07-10 ENCOUNTER — Ambulatory Visit (INDEPENDENT_AMBULATORY_CARE_PROVIDER_SITE_OTHER): Payer: 59 | Admitting: Ophthalmology

## 2022-07-10 ENCOUNTER — Encounter (INDEPENDENT_AMBULATORY_CARE_PROVIDER_SITE_OTHER): Payer: Self-pay | Admitting: Ophthalmology

## 2022-07-10 DIAGNOSIS — H35033 Hypertensive retinopathy, bilateral: Secondary | ICD-10-CM | POA: Diagnosis not present

## 2022-07-10 DIAGNOSIS — H43813 Vitreous degeneration, bilateral: Secondary | ICD-10-CM | POA: Diagnosis not present

## 2022-07-10 DIAGNOSIS — E11319 Type 2 diabetes mellitus with unspecified diabetic retinopathy without macular edema: Secondary | ICD-10-CM

## 2022-07-10 DIAGNOSIS — Z794 Long term (current) use of insulin: Secondary | ICD-10-CM | POA: Diagnosis not present

## 2022-07-10 DIAGNOSIS — Z7984 Long term (current) use of oral hypoglycemic drugs: Secondary | ICD-10-CM | POA: Diagnosis not present

## 2022-07-10 DIAGNOSIS — H35049 Retinal micro-aneurysms, unspecified, unspecified eye: Secondary | ICD-10-CM

## 2022-07-10 DIAGNOSIS — Z961 Presence of intraocular lens: Secondary | ICD-10-CM | POA: Diagnosis not present

## 2022-07-10 DIAGNOSIS — E113293 Type 2 diabetes mellitus with mild nonproliferative diabetic retinopathy without macular edema, bilateral: Secondary | ICD-10-CM | POA: Diagnosis not present

## 2022-07-10 DIAGNOSIS — H26492 Other secondary cataract, left eye: Secondary | ICD-10-CM

## 2022-07-10 DIAGNOSIS — I10 Essential (primary) hypertension: Secondary | ICD-10-CM

## 2022-07-10 LAB — HM DIABETES EYE EXAM

## 2022-07-12 ENCOUNTER — Other Ambulatory Visit: Payer: Self-pay

## 2022-07-12 ENCOUNTER — Encounter: Payer: Self-pay | Admitting: Nurse Practitioner

## 2022-07-12 DIAGNOSIS — E1142 Type 2 diabetes mellitus with diabetic polyneuropathy: Secondary | ICD-10-CM

## 2022-07-12 MED ORDER — TRULICITY 3 MG/0.5ML ~~LOC~~ SOAJ
3.0000 mg | SUBCUTANEOUS | 3 refills | Status: DC
Start: 2022-07-12 — End: 2022-08-21
  Filled 2022-07-12: qty 2, 28d supply, fill #0
  Filled 2022-08-16: qty 2, 28d supply, fill #1

## 2022-07-14 ENCOUNTER — Encounter: Payer: Self-pay | Admitting: Nurse Practitioner

## 2022-07-14 DIAGNOSIS — Z0000 Encounter for general adult medical examination without abnormal findings: Secondary | ICD-10-CM | POA: Insufficient documentation

## 2022-07-14 NOTE — Assessment & Plan Note (Signed)
Chronic kidney disease in the setting of diabetes, hypertension, hyperlipidemia, and cardiovascular disease.  Strict blood sugar, blood pressure, and dietary restrictions are strongly encouraged to reduce risk of exacerbation or worsening of current symptoms.  Plan: - Labs pending today - Continue current medications and lifestyle management.

## 2022-07-14 NOTE — Assessment & Plan Note (Signed)

## 2022-07-14 NOTE — Assessment & Plan Note (Signed)
Chronic.  Labs pending today.  Currently on replacement therapy orally. Plan: - Continue current oral replacement. - Will make changes to plan of care as necessary based on lab findings.

## 2022-07-14 NOTE — Assessment & Plan Note (Signed)
Chronic hypertension in the setting of type 2 diabetes, hyperlipidemia anemia, chronic kidney disease, obesity, and cardiovascular disease.  Strict blood pressure control is recommended for optimal management and prevention of worsening conditions.  She is currently managed with amlodipine and hydrochlorothiazide with excellent blood pressure control today. Plan: Labs pending today. - Continue current medication regimen.

## 2022-07-14 NOTE — Assessment & Plan Note (Signed)
Chronic hyperlipidemia in the setting of type 2 diabetes, hypertension, obesity, chronic kidney disease, cardiovascular disease, and fatty liver disease.  Currently on lovastatin for management.  She is also taking Trulicity for blood sugar control which may also aid in control. Plan: - Continue current medication regimen. - Labs pending today -Review of most recent labs from August showed adequate control. -Will make changes to plan of care as necessary based on lab findings.

## 2022-07-14 NOTE — Assessment & Plan Note (Signed)
Chronic obesity in the setting of type II diabetes, paraplegia, hypertension, hyperlipidemia, and cardiovascular disease.  Unfortunately the patient is limited in her ability to exercise appropriately.  Recommend dietary management and upper arm exercises to help with physical activity. Plan: - Labs pending today. - Consider therapy resources such as aqua therapy which may help with physical activity component in the setting of mobility limitations.

## 2022-07-14 NOTE — Assessment & Plan Note (Signed)
Chronic fatty liver disease in the setting of hypertension, hyperlipidemia, diabetes, chronic kidney disease, and coronary artery disease.  Diet and exercise recommendations discussed.  Strict avoidance of medications that are harmful on the liver to prevent exacerbation or worsening symptoms.

## 2022-07-14 NOTE — Assessment & Plan Note (Signed)
Labs pending today for evaluation.  

## 2022-07-14 NOTE — Assessment & Plan Note (Signed)
Chronic paraplegia of lower extremities with reliance on wheelchair for mobility.  She is having concerns with her motorized wheelchair, which is essential for her mobility. Plan: - Encourage patient contact the wheelchair manufacturer supplier for assistance with the loose bolts. - If replacement is needed we will be happy to send prescription for new wheelchair based on insurance coverage.

## 2022-07-14 NOTE — Assessment & Plan Note (Signed)
Lower abdominal cramping most likely associated with irritable bowel syndrome.  Bentyl has been effective for management in the past. Plan: - Prescribe Bentyl as needed 3 times a day with meals for control. - If no improvement of symptoms with medication referral to GI for further evaluation recommended.

## 2022-07-14 NOTE — Assessment & Plan Note (Signed)
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  Movement limitations impeded ability for routine exercise.  She may benefit from aqua therapy to aid in physical activity.  We can consider this if the patient is willing.

## 2022-07-14 NOTE — Assessment & Plan Note (Signed)
Chronic atherosclerosis detected on imaging in the setting of hypertension, hyperlipidemia, and diabetes.  Strict management with statin therapy recommended as well as dietary restrictions of cholesterol and fat for optimal control.  Labs pending today.

## 2022-07-14 NOTE — Assessment & Plan Note (Signed)
Chronic type 2 diabetes in the setting of hypertension, hyperlipidemia, fatty liver disease, cardiovascular disease, and chronic kidney disease.  Labs from August of last year showed good control.  She is currently managed with Trulicity, Humalog, metformin, Toujeo, and utilization of Dexcom. Plan: - Labs pending today. - Continue current medications.  Based on labs changes will be made as appropriate. - Dietary recommendations reviewed.

## 2022-07-16 ENCOUNTER — Other Ambulatory Visit: Payer: Self-pay

## 2022-07-18 ENCOUNTER — Other Ambulatory Visit: Payer: Self-pay

## 2022-08-04 DIAGNOSIS — Z794 Long term (current) use of insulin: Secondary | ICD-10-CM | POA: Diagnosis not present

## 2022-08-04 DIAGNOSIS — E118 Type 2 diabetes mellitus with unspecified complications: Secondary | ICD-10-CM | POA: Diagnosis not present

## 2022-08-16 ENCOUNTER — Other Ambulatory Visit: Payer: Self-pay

## 2022-08-21 ENCOUNTER — Other Ambulatory Visit: Payer: Self-pay

## 2022-08-21 ENCOUNTER — Other Ambulatory Visit (HOSPITAL_COMMUNITY): Payer: Self-pay

## 2022-08-21 ENCOUNTER — Encounter: Payer: Self-pay | Admitting: Internal Medicine

## 2022-08-21 ENCOUNTER — Ambulatory Visit (INDEPENDENT_AMBULATORY_CARE_PROVIDER_SITE_OTHER): Payer: 59 | Admitting: Internal Medicine

## 2022-08-21 VITALS — BP 138/70 | HR 71

## 2022-08-21 DIAGNOSIS — Z794 Long term (current) use of insulin: Secondary | ICD-10-CM

## 2022-08-21 DIAGNOSIS — E1165 Type 2 diabetes mellitus with hyperglycemia: Secondary | ICD-10-CM

## 2022-08-21 DIAGNOSIS — E782 Mixed hyperlipidemia: Secondary | ICD-10-CM

## 2022-08-21 DIAGNOSIS — E119 Type 2 diabetes mellitus without complications: Secondary | ICD-10-CM

## 2022-08-21 DIAGNOSIS — Z7984 Long term (current) use of oral hypoglycemic drugs: Secondary | ICD-10-CM | POA: Diagnosis not present

## 2022-08-21 DIAGNOSIS — Z7985 Long-term (current) use of injectable non-insulin antidiabetic drugs: Secondary | ICD-10-CM

## 2022-08-21 DIAGNOSIS — E538 Deficiency of other specified B group vitamins: Secondary | ICD-10-CM | POA: Diagnosis not present

## 2022-08-21 DIAGNOSIS — E1142 Type 2 diabetes mellitus with diabetic polyneuropathy: Secondary | ICD-10-CM

## 2022-08-21 LAB — POCT GLYCOSYLATED HEMOGLOBIN (HGB A1C): Hemoglobin A1C: 7.5 % — AB (ref 4.0–5.6)

## 2022-08-21 MED ORDER — INSULIN LISPRO (1 UNIT DIAL) 100 UNIT/ML (KWIKPEN)
12.0000 [IU] | PEN_INJECTOR | SUBCUTANEOUS | 3 refills | Status: DC
Start: 2022-08-21 — End: 2023-09-26
  Filled 2022-08-21: qty 45, fill #0
  Filled 2022-10-29: qty 45, 75d supply, fill #0
  Filled 2022-12-25: qty 45, 75d supply, fill #1
  Filled 2023-03-26: qty 45, 75d supply, fill #2
  Filled 2023-06-25: qty 45, 75d supply, fill #3

## 2022-08-21 MED ORDER — TOUJEO SOLOSTAR 300 UNIT/ML ~~LOC~~ SOPN
34.0000 [IU] | PEN_INJECTOR | Freq: Every day | SUBCUTANEOUS | 3 refills | Status: DC
  Filled 2022-08-21: qty 12, fill #0
  Filled 2022-10-29: qty 9, 75d supply, fill #0
  Filled 2022-12-25: qty 9, 75d supply, fill #1
  Filled 2022-12-25: qty 9, 75d supply, fill #0
  Filled 2023-03-26: qty 9, 75d supply, fill #1
  Filled 2023-06-03: qty 9, 75d supply, fill #2

## 2022-08-21 MED ORDER — METFORMIN HCL ER 500 MG PO TB24
500.0000 mg | ORAL_TABLET | Freq: Two times a day (BID) | ORAL | 3 refills | Status: DC
  Filled 2022-08-21: qty 180, fill #0

## 2022-08-21 MED ORDER — OZEMPIC (1 MG/DOSE) 4 MG/3ML ~~LOC~~ SOPN
1.0000 mg | PEN_INJECTOR | SUBCUTANEOUS | 3 refills | Status: DC
  Filled 2022-08-21 (×2): qty 9, 84d supply, fill #0
  Filled 2022-12-25: qty 9, 84d supply, fill #1
  Filled 2023-03-26: qty 9, 84d supply, fill #2
  Filled 2023-06-25: qty 9, 84d supply, fill #3

## 2022-08-21 MED ORDER — CAREFINE PEN NEEDLES 32G X 4 MM MISC
3 refills | Status: AC
  Filled 2022-08-21: qty 300, fill #0
  Filled 2023-03-26: qty 300, 75d supply, fill #0
  Filled 2023-06-25 (×2): qty 300, 75d supply, fill #1

## 2022-08-21 NOTE — Progress Notes (Signed)
Patient ID: Cassandra Stewart, female   DOB: [birth year], 72 y.o.   MRN: [id number]   HPI: Cassandra Stewart is a 72 y.o.-year-old female, returning for follow-up for DM2, dx in 1992, insulin-dependent since 2006, uncontrolled, with complications (CKD stage 3, PN, DR).  Last visit 4 months ago  Interim history: No increased urination, blurry vision, nausea.  This She has swelling in her legs, especially L.   Reviewed HbA1c levels: Lab Results  Component Value Date   HGBA1C 7.5 (A) 04/04/2022   HGBA1C 7.3 (A) 11/28/2021   HGBA1C 7.7 (A) 07/19/2021   HGBA1C 8.5 (A) 04/09/2021   HGBA1C 7.1 (A) 12/06/2020   HGBA1C 8.5 (A) 08/04/2020   HGBA1C 9.9 (A) 04/06/2020   HGBA1C 7.4 (A) 12/02/2019   HGBA1C 9.3 (A) 07/29/2019   HGBA1C 9.6 (A) 03/30/2019   HGBA1C 9.8 (A) 12/28/2018   HGBA1C 10.7 (A) 07/13/2018   HGBA1C 6.8 (A) 03/16/2018   HGBA1C 6.1 (A) 11/12/2017   HGBA1C 9.0 07/11/2017   HGBA1C 9.0 04/14/2017   HGBA1C 8.7% 10/24/2016   HGBA1C 9.6% 07/25/2016   HGBA1C 10.5% 03/04/2016   HGBA1C 8.1% 10/25/2015  03/16/2018: HbA1c calculated from fructosamine 6.28%  Pt was on a regimen of: - Metformin 500 mg 1x a day with dinner.  She had diarrhea with a higher dose. - Toujeo 45 units in am - Humalog 18 units 2-3x a day, before meals Tried: Actos, Lantus.  Now on: - Metformin ER 500 mg 1-2x a day with meals. - Trulicity 1.5 >> 3 mg weekly - restarted 03/2021 - intermittently out 2/2 lack of supply (off for 3 weeks) - Toujeo 35 >> 28 >> 36 >> 40 >> 34 >> 36 units at night - Humalog 7-12 units before meals and 5 >> 7 units before snack at night >> 12-20 units before the meals (higher doses with lunch), and 7 units before the snack at night   At last visit she was not taking blood sugars that she did not have transmitters ([facility] did not send them).  Now taking >4 times a day with her Dexcom CGM (with receiver):  Previously:   Previously:   Lowest sugar was 40 >> 49 >> 49; she has  hypoglycemia awareness in the 80s. Highest sugar was 357 >> 393 (cereal: fruity pebbles) >> 260.  Glucometer: Freestyle  Pt's meals are: - Breakfast/brunch: egg, bacon, toast >> no b'fast  - Lunch: snack mostly (no Humalog) >> Meals on Wheels - Dinner: chicken/fish + vegetables >> moved before 7 pm - Snacks: Belvita cracker if hungry  -+ Stage III CKD; last BUN/creatinine:  Lab Results  Component Value Date   BUN 18 10/15/2021   BUN 20 05/27/2021   CREATININE 1.00 10/15/2021   CREATININE 1.07 (H) 05/27/2021   Lab Results  Component Value Date   GFRAA 54 (L) 03/02/2020   GFRAA 59 (L) 08/31/2019   GFRAA 58 (L) 03/24/2019   GFRAA 59 (L) 03/12/2019   GFRAA 55 (L) 03/08/2019  Previously on lisinopril 10, but stopped due to hyperkalemia.   -+ HL; last set of lipids: Lab Results  Component Value Date   CHOL 158 10/15/2021   HDL 72 10/15/2021   LDLCALC 62 10/15/2021   TRIG 143 10/15/2021   CHOLHDL 2.2 10/15/2021  On lovastatin 40 mg daily.  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 Vanessa Genene).  She has history of cataract surgery OU.  -+ Numbness and tingling in her fingers-on Neurontin 300 mg  3x a day per PCP.  Latest foot exam 04/04/2022. On ASA 81.  Low vitamin B12:  Reviewed B12 levels: Lab Results  Component Value Date   VITAMINB12 >1500 (H) 11/28/2021   VITAMINB12 >1550 (H) 12/06/2020   VITAMINB12 >1526 (H) 07/29/2019   VITAMINB12 >1500 (H) 03/30/2019   VITAMINB12 >1500 (H) 12/28/2018   VITAMINB12 261 03/16/2018   VITAMINB12 300 04/14/2017   VITAMINB12 478 10/25/2015  05/01/2016: Vit B12 248.  We initially started 5000 mcg B12 daily, which was then decreased to 2500 mcg daily and, then to 1000 mcg daily >> every other day >> 2x a week - 11/2020 >> decreased to once a week 11/2021.  She continues to be in a wheelchair as her left leg is very weak-believed to be from a herniated intervertebral disc.  She had surgery for this in 2016 but  the strength did not improve. She was in the emergency room on 10/24/2020 for R wrist pain and swelling.  She also sees Dr. Jiles Garter.  No fracture. Has a brace. Her ulnar nerve is affected by using her home wheelchair. She was in the emergency room 11/28/2020 for chest pain.  Troponins were negative.  D-dimer was still elevated. She continues to have neck and back pain.   She has an extensive FH of heart disease - women in her family. Daughter died after her heart stopped.  ROS: + See HPI  I reviewed pt's medications, allergies, PMH, social hx, family hx, and changes were documented in the history of present illness. Otherwise, unchanged from my initial visit note.  Past Medical History:  Diagnosis Date   Abdominal pain, chronic, left lower quadrant 08/25/2018   Elevated LFTs    Family history of heart disease 12/04/2018   Fatty liver    on [place].    Hyperlipemia    Hypertension    Inguinal hernia    2020 CT   Morbid obesity (HCC) 07/04/2015   Numbness and tingling in right hand 03/01/2021   Palpitation 03/08/2021   Stage 3b chronic kidney disease (HCC)    Thoracic spondylosis with myelopathy    Umbilical hernia    2020 CT   Uncontrolled type 2 diabetes with neuropathy    Weakness of left leg 07/04/2015   Wheelchair bound    paraplegia of Leg - Left   Past Surgical History:  Procedure Laterality Date   APPENDECTOMY  1969   BREAST BIOPSY Right    CATARACT EXTRACTION     May [id number]   LUMBAR DISC SURGERY     LUMBAR LAMINECTOMY/DECOMPRESSION MICRODISCECTOMY N/A 07/06/2015   Procedure: Thoracic ten- eleven laminectomy for spinal canal decompression;  Surgeon: Coletta Memos, MD;  Location: [facility];  Service: Orthopedics;  Laterality: N/A;   Social History   Social History   Marital status: Divorced    Spouse name: N/A   Number of children: 0   Occupational History   None   Social History Main Topics   Smoking status: Never Smoker   Smokeless tobacco: Never Used    Alcohol use No   Drug use: No   Current Outpatient Medications on File Prior to Visit  Medication Sig Dispense Refill   ACCU-CHEK GUIDE test strip USE AS DIRECTED TO TEST BLOOD SUGAR FOUR TIMES DAILY 400 strip 1   acetaminophen (TYLENOL) 650 MG CR tablet Take 650 mg by mouth every 8 (eight) hours as needed for pain.     amLODipine (NORVASC) 10 MG tablet TAKE 1 TABLET(10 MG) BY MOUTH  DAILY 90 tablet 0   aspirin EC 81 MG tablet Take 81 mg by mouth every evening.     Cholecalciferol (VITAMIN D PO) Take 1,000 mg by mouth daily.      Continuous Blood Gluc Receiver (DEXCOM G6 RECEIVER) DEVI by Does not apply route.     Cyanocobalamin (VITAMIN B 12 PO) Take 5,000 mg by mouth daily.     diclofenac Sodium (VOLTAREN) 1 % GEL Apply 4 g topically 4 (four) times daily. 100 g 0   dicyclomine (BENTYL) 10 MG capsule Take 1 capsule (10 mg total) by mouth 3 (three) times daily before meals. 270 capsule 3   Dulaglutide (TRULICITY) 3 MG/0.5ML SOPN Inject 3 mg into the skin once a week. 6 mL 3   gabapentin (NEURONTIN) 300 MG capsule TAKE 1 CAPSULE(300 MG) BY MOUTH THREE TIMES DAILY 270 capsule 0   hydrochlorothiazide (MICROZIDE) 12.5 MG capsule TAKE 1 CAPSULE(12.5 MG) BY MOUTH DAILY 90 capsule 0   insulin lispro (HUMALOG) 100 UNIT/ML KwikPen INJECT 7-10 UNITS UNDER THE SKIN 2-3 TIMES DAILY 30 mL 3   Insulin Pen Needle (CAREFINE PEN NEEDLES) 32G X 4 MM MISC Use 4x a day 300 each 3   lovastatin (MEVACOR) 40 MG tablet TAKE 1 TABLET(40 MG) BY MOUTH AT BEDTIME 90 tablet 0   metFORMIN (GLUCOPHAGE-XR) 500 MG 24 hr tablet TAKE 1 TABLET(500 MG) BY MOUTH TWICE DAILY 180 tablet 3   methocarbamol (ROBAXIN) 500 MG tablet Take 1 tablet (500 mg total) by mouth every 8 (eight) hours as needed for muscle spasms. 90 tablet 5   nitroGLYCERIN (NITROSTAT) 0.4 MG SL tablet Place 1 tablet (0.4 mg total) under the tongue every 5 (five) minutes as needed for chest pain. (Patient not taking: Reported on 06/28/2022) 25 tablet 3   polyethylene  glycol (MIRALAX / GLYCOLAX) 17 g packet Take 17 g by mouth as needed.     TOUJEO SOLOSTAR 300 UNIT/ML Solostar Pen ADMINISTER 34-36 UNITS UNDER THE SKIN AT BEDTIME 12 mL 3   No current facility-administered medications on file prior to visit.   Allergies  Allergen Reactions   Tramadol Nausea And Vomiting    Severe N/V- with Tramadol   Other     PT PREFERS TO NOT HAVE ANY NARCOTIC MEDICATIONS   Chlorhexidine Rash    Burning after using CHG wipes-used for surgery   Oxycodone Hcl     Other reaction(s): Hallucination Marked hallucinations and palpitations following dose of 10mg  on 04/30/2015    Family History  Problem Relation Age of Onset   Diabetes Mother    Heart disease Mother    Pancreatic cancer Sister    Kidney disease Brother        Two brothers on ESRD   Amblyopia Neg Hx    Blindness Neg Hx    Cataracts Neg Hx    Glaucoma Neg Hx    Macular degeneration Neg Hx    Retinal detachment Neg Hx    Strabismus Neg Hx    Retinitis pigmentosa Neg Hx    Colon cancer Neg Hx    Esophageal cancer Neg Hx    Pt has FH of DM in M, MGM, PGM, M aunt, uncles.  PE: BP 138/70   Pulse 71   SpO2 98% patient in wheelchair, could not be weighed Wt Readings from Last 3 Encounters:  04/04/22 218 lb (98.9 kg)  12/07/21 212 lb (96.2 kg)  10/15/21 216 lb (98 kg)   Constitutional: overweight, in NAD, in wheelchair Eyes: EOMI,  no exophthalmos ENT: no thyromegaly, no cervical lymphadenopathy Cardiovascular: RRR, No MRG, + L ankle edema - pitting (after fall 2017) Respiratory: CTA B Musculoskeletal: + paraplegia Skin: no rashes Neurological: no tremor with outstretched hands  ASSESSMENT: 1. DM2, insulin-dependent, uncontrolled, with complications - CKD stage 3 - PN - DR  - Freestyle libre CGM was not covered by the insurance  2. Low B12  3. HL  PLAN:  1. Patient with longstanding, uncontrolled, type 2 diabetes, on basal BOLUS insulin regimen along with metformin and GLP-1 receptor  agonist, with suboptimal control.  At last visit, HbA1c was higher, at 7.5%, increased from 7.3%.  At that time, sugars appears to be much higher in the previous 2 weeks compared to before.  She was eating sugary cereals in the morning and even though she was taking Humalog, she was taking a relatively lower doses.  We discussed about stopping the cereals but if she decided to continue with them to inject more insulin before eating.  Also, she was off her sensors and had a lot of problems obtaining them from the supplier and had a lot of stress.  I gave her samples for the CGM at last visit.  We did not change the regimen otherwise. CGM interpretation: -At today's visit, we reviewed her CGM downloads: It appears that 69% of values are in target range (goal >70%), while 31% are higher than 180 (goal <25%), and 0% are lower than 70 (goal <4%).  The calculated average blood sugar is 157.  The projected HbA1c for the next 3 months (GMI) is 7.1%. -Reviewing the CGM trends, sugars are mostly fluctuating within the target range, but they increase after each meal, more so after dinner.  They remain mostly above target in the middle of the night and then improve until morning.  Upon questioning, patient has been off Trulicity for the last 3 weeks and she finally obtained it today, but only enough for 1 month.  We discussed that Trulicity is a Sport and exercise psychologist and discussed about switching to Ozempic.  I sent a prescription for the 1 mg of Ozempic to her pharmacy and advised her to start and 0.5 mg weekly to see how she tolerates it.  If she tolerates it well, increase to 1 mg weekly.  We continued the rest of the regimen.  She does mention that she had some isolated levels, and I advised her to let me know if these occur after we start Ozempic.  In that case, we need to back off her insulin doses. - I suggested to:  Patient Instructions  Please continue: - Metformin ER 500 mg 1-2x a day with meals - Toujeo 34-36  units at night - Humalog 12-20 units before the meals (higher doses with lunch), and 7 units before the snack at night   Please change from Trulicity to: - Ozempic 1 mg weekly (start with 0.5 mg weekly = 36 clicks)  Please return in 4 months.   - we checked her HbA1c: 7.5% (stable) - advised to check sugars at different times of the day - 4x a day, rotating check times - advised for yearly eye exams >> she is UTD - return to clinic in 3-4 months  2. Low B12 -pt. has a history of low B12 for which we started supplementation with p.o. B12 -We had to reduce the dose subsequently as her B12 level was elevated.  Now 1000 mcg once a week. -Reviewed her B12 levels: Lab Results  Component Value  Date   VITAMINB12 >1500 (H) 11/28/2021   VITAMINB12 >1550 (H) 12/06/2020   3. HL -Reviewed latest lipid panel from 2023: Fractions at goal: Lab Results  Component Value Date   CHOL 158 10/15/2021   HDL 72 10/15/2021   LDLCALC 62 10/15/2021   TRIG 143 10/15/2021   CHOLHDL 2.2 10/15/2021  -She is on Mevacor 40 mg daily without side effects  Carlus Pavlov, MD PhD [facility]

## 2022-08-21 NOTE — Patient Instructions (Addendum)
Please continue: - Metformin ER 500 mg 1-2x a day with meals - Toujeo 34-36 units at night - Humalog 12-20 units before the meals (higher doses with lunch), and 7 units before the snack at night   Please change from Trulicity to: - Ozempic 1 mg weekly (start with 0.5 mg weekly = 36 clicks)  Please return in 4 months.

## 2022-08-23 ENCOUNTER — Other Ambulatory Visit: Payer: Self-pay

## 2022-09-03 DIAGNOSIS — Z794 Long term (current) use of insulin: Secondary | ICD-10-CM | POA: Diagnosis not present

## 2022-09-03 DIAGNOSIS — E118 Type 2 diabetes mellitus with unspecified complications: Secondary | ICD-10-CM | POA: Diagnosis not present

## 2022-09-11 ENCOUNTER — Encounter: Payer: Self-pay | Admitting: Physical Medicine and Rehabilitation

## 2022-09-11 ENCOUNTER — Encounter: Payer: 59 | Attending: Physical Medicine and Rehabilitation | Admitting: Physical Medicine and Rehabilitation

## 2022-09-11 ENCOUNTER — Other Ambulatory Visit: Payer: Self-pay | Admitting: Nurse Practitioner

## 2022-09-11 ENCOUNTER — Other Ambulatory Visit: Payer: Self-pay | Admitting: Medical

## 2022-09-11 VITALS — BP 148/77 | HR 68

## 2022-09-11 DIAGNOSIS — Z993 Dependence on wheelchair: Secondary | ICD-10-CM | POA: Insufficient documentation

## 2022-09-11 DIAGNOSIS — E782 Mixed hyperlipidemia: Secondary | ICD-10-CM

## 2022-09-11 DIAGNOSIS — M7918 Myalgia, other site: Secondary | ICD-10-CM | POA: Diagnosis not present

## 2022-09-11 DIAGNOSIS — G8222 Paraplegia, incomplete: Secondary | ICD-10-CM | POA: Diagnosis not present

## 2022-09-11 DIAGNOSIS — M4804 Spinal stenosis, thoracic region: Secondary | ICD-10-CM | POA: Insufficient documentation

## 2022-09-11 DIAGNOSIS — I10 Essential (primary) hypertension: Secondary | ICD-10-CM

## 2022-09-11 MED ORDER — LIDOCAINE HCL 1 % IJ SOLN
3.0000 mL | Freq: Once | INTRAMUSCULAR | Status: AC
Start: 2022-09-11 — End: 2022-09-11
  Administered 2022-09-11: 3 mL

## 2022-09-11 NOTE — Progress Notes (Signed)
Pt is a 72 yr old R handed female  With hx of HTN, DM- poorly controlled per chart; obesity- BMI >35, HLD, Vit B levels are high- with hx of thoracic spondylosis/myelopathy and LE weakness- mainly LLE weak.  Here for f/u on weakness. w/c dependent. In power w/c.  Also new R wrist pain- ulnar styloid pain/TTP Here for f/u on Thoracic nontraumatic myelopathy- incomplete paraplegia.  Also has carpal tunnel syndrome on R.  Here for f/u on incomplete paraplegia.    Muscle relaxant wasn't effective for neck muscle tightness.  Still having a lot of stiffness in beck and upper back.   That's been her main issue for last few months.  Tried Robaxin- tried for 3 days- 2-3x/day when tried it.   Also tried tennis balls- helped a little but- didn't last - only when doing it.  Not causing any HA's.     Plan:  Patient here for trigger point injections for myofascial pain  Consent done and on chart.  Cleaned areas with alcohol and injected using a 27 gauge 1.5 inch needle  Injected 3cc- none wasted Using 1% Lidocaine with no EPI  Upper traps B/L  Levators Posterior scalenes Middle scalenes Splenius Capitus Pectoralis Major Rhomboids B/L x3 on L and 1 on R Infraspinatus B/L  Teres Major/minor Thoracic paraspinals Lumbar paraspinals Other injections-    Patient's level of pain prior was 6/10 Current level of pain after injections is- maybe 4/10  There was no bleeding or complications.  Patient was advised to drink a lot of water on day after injections to flush system Will have increased soreness for 12-48 hours after injections.  Can use Lidocaine patches the day AFTER injections Can use theracane or tennis balls on day of injections in places didn't inject- tennis ablls start tomorrow in places injected Can use heating pad 4-6 hours AFTER injections or ice when you get home.   2. Need to go back to doing tennis balls-  ~ 3x/week- to MAINTAIN the relaxation- so the injections last  longer.    3. Doesn't want opiates for back pain- which I agree with .    4. F/U in 3 months- double appt- for SCI- and TrP injections.

## 2022-09-11 NOTE — Patient Instructions (Signed)
  Patient here for trigger point injections for myofascial pain  Consent done and on chart.  Cleaned areas with alcohol and injected using a 27 gauge 1.5 inch needle  Injected 3cc- none wasted Using 1% Lidocaine with no EPI  Upper traps B/L  Levators Posterior scalenes Middle scalenes Splenius Capitus Pectoralis Major Rhomboids B/L x3 on L and 1 on R Infraspinatus B/L  Teres Major/minor Thoracic paraspinals Lumbar paraspinals Other injections-    Patient's level of pain prior was 6/10 Current level of pain after injections is- maybe 4/10  There was no bleeding or complications.  Patient was advised to drink a lot of water on day after injections to flush system Will have increased soreness for 12-48 hours after injections.  Can use Lidocaine patches the day AFTER injections Can use theracane or tennis balls on day of injections in places didn't inject- tennis ablls start tomorrow in places injected Can use heating pad 4-6 hours AFTER injections or ice when you get home.   2. Need to go back to doing tennis balls-  ~ 3x/week- to MAINTAIN the relaxation- so the injections last longer.    3. Doesn't want opiates for back pain- which I agree with .    4. F/U in 3months- double appt- for SCI- and TrP injections.

## 2022-09-11 NOTE — Addendum Note (Signed)
Addended by: Silas [place] T on: 09/11/2022 11:28 AM   Modules accepted: Orders

## 2022-10-05 DIAGNOSIS — Z794 Long term (current) use of insulin: Secondary | ICD-10-CM | POA: Diagnosis not present

## 2022-10-05 DIAGNOSIS — E118 Type 2 diabetes mellitus with unspecified complications: Secondary | ICD-10-CM | POA: Diagnosis not present

## 2022-10-11 DIAGNOSIS — Z794 Long term (current) use of insulin: Secondary | ICD-10-CM | POA: Diagnosis not present

## 2022-10-11 DIAGNOSIS — E118 Type 2 diabetes mellitus with unspecified complications: Secondary | ICD-10-CM | POA: Diagnosis not present

## 2022-10-29 ENCOUNTER — Other Ambulatory Visit (HOSPITAL_COMMUNITY): Payer: Self-pay

## 2022-10-29 ENCOUNTER — Other Ambulatory Visit: Payer: Self-pay

## 2022-10-30 ENCOUNTER — Other Ambulatory Visit: Payer: Self-pay

## 2022-11-01 NOTE — Addendum Note (Signed)
Addended by: Pollie Meyer on: 11/01/2022 11:45 AM   Modules accepted: Orders

## 2022-11-10 DIAGNOSIS — Z794 Long term (current) use of insulin: Secondary | ICD-10-CM | POA: Diagnosis not present

## 2022-11-10 DIAGNOSIS — E118 Type 2 diabetes mellitus with unspecified complications: Secondary | ICD-10-CM | POA: Diagnosis not present

## 2022-11-29 ENCOUNTER — Telehealth: Payer: Self-pay

## 2022-11-29 NOTE — Telephone Encounter (Signed)
Spoke with patient and advised her on her Ozempic and how to take it after discussion with MD

## 2022-11-29 NOTE — Telephone Encounter (Signed)
Patient called with concerns regarding blood glucose levels since change in medication from Trulicity to Ozempic. Per patient she is taking the Ozempic as directed 1mg  per week and getting fasting readings in the morning ranging from 166-200s. Also admits to a few afternoons a few hours after eating glucose levels being as high as the 400s which is not normal per patient. Patient stated she has not changed her eating habits and that she is eating 2 meals per day as she always have been and that MD is aware. Patient feels the Ozempic is not giving her the results she was getting with Trulicity and request advisement from MD on next steps.

## 2022-11-29 NOTE — Telephone Encounter (Signed)
If she agrees, lets go ahead and increase the dose of Ozempic to 2 mg weekly.  If she has the 1 mg pens at home, she can double up on these.  If she does not, lets send a prescription for 9 ml Ozempic 2 mg, with 3 refills.

## 2022-12-10 ENCOUNTER — Ambulatory Visit: Payer: 59

## 2022-12-10 ENCOUNTER — Other Ambulatory Visit: Payer: Self-pay | Admitting: Internal Medicine

## 2022-12-10 ENCOUNTER — Other Ambulatory Visit: Payer: Self-pay | Admitting: Nurse Practitioner

## 2022-12-10 DIAGNOSIS — Z0000 Encounter for general adult medical examination without abnormal findings: Secondary | ICD-10-CM | POA: Diagnosis not present

## 2022-12-10 DIAGNOSIS — I10 Essential (primary) hypertension: Secondary | ICD-10-CM

## 2022-12-10 NOTE — Patient Instructions (Signed)
Cassandra Stewart , Thank you for taking time to come for your Medicare Wellness Visit. I appreciate your ongoing commitment to your health goals. Please review the following plan we discussed and let me know if I can assist you in the future.   Referrals/Orders/Follow-Ups/Clinician Recommendations: none  This is a list of the screening recommended for you and due dates:  Health Maintenance  Topic Date Due   Colon Cancer Screening  Never done   Yearly kidney health urinalysis for diabetes  07/25/2017   Yearly kidney function blood test for diabetes  10/16/2022   Hemoglobin A1C  02/20/2023   Complete foot exam   04/05/2023   Eye exam for diabetics  07/10/2023   Medicare Annual Wellness Visit  12/10/2023   DEXA scan (bone density measurement)  Completed   Hepatitis C Screening  Completed   HPV Vaccine  Aged Out   DTaP/Tdap/Td vaccine  Discontinued   Pneumonia Vaccine  Discontinued   Flu Shot  Discontinued   Mammogram  Discontinued   COVID-19 Vaccine  Discontinued   Zoster (Shingles) Vaccine  Discontinued    Advanced directives: (In Chart) A copy of your advanced directives are scanned into your chart should your provider ever need it.  Next Medicare Annual Wellness Visit scheduled for next year: Yes  Insert Preventive Care attachment Insert FALL PREVENTION attachment if needed

## 2022-12-10 NOTE — Progress Notes (Signed)
Subjective:   Cassandra Stewart is a 72 y.o. female who presents for Medicare Annual (Subsequent) preventive examination.  Visit Complete: Virtual I connected with  Tressie Stalker on 12/10/22 by a audio enabled telemedicine application and verified that I am speaking with the correct person using two identifiers.  Patient Location: Home  Provider Location: Office/Clinic  I discussed the limitations of evaluation and management by telemedicine. The patient expressed understanding and agreed to proceed.  Vital Signs: Because this visit was a virtual/telehealth visit, some criteria may be missing or patient reported. Any vitals not documented were not able to be obtained and vitals that have been documented are patient reported.    Cardiac Risk Factors include: advanced age (>54men, >50 women);diabetes mellitus;dyslipidemia;hypertension     Objective:    Today's Vitals   12/10/22 1041  PainSc: 4    There is no height or weight on file to calculate BMI.     12/10/2022   10:48 AM 12/07/2021    1:32 PM 05/27/2021    2:56 PM 04/20/2021   11:29 AM 12/04/2020    2:24 PM 11/28/2020    3:38 PM 11/30/2018    9:20 PM  Advanced Directives  Does Patient Have a Medical Advance Directive? Yes Yes No No No No No  Type of Advance Directive Out of facility DNR (pink MOST or yellow form) Out of facility DNR (pink MOST or yellow form)       Would patient like information on creating a medical advance directive?     Yes (MAU/Ambulatory/Procedural Areas - Information given)      Current Medications (verified) Outpatient Encounter Medications as of 12/10/2022  Medication Sig   ACCU-CHEK GUIDE test strip USE AS DIRECTED TO TEST BLOOD SUGAR FOUR TIMES DAILY   acetaminophen (TYLENOL) 650 MG CR tablet Take 650 mg by mouth every 8 (eight) hours as needed for pain.   amLODipine (NORVASC) 10 MG tablet TAKE 1 TABLET(10 MG) BY MOUTH DAILY   aspirin EC 81 MG tablet Take 81 mg by mouth every evening.    Cholecalciferol (VITAMIN D PO) Take 1,000 mg by mouth daily.    Continuous Blood Gluc Receiver (DEXCOM G6 RECEIVER) DEVI by Does not apply route.   Cyanocobalamin (VITAMIN B 12 PO) Take 5,000 mg by mouth daily.   diclofenac Sodium (VOLTAREN) 1 % GEL Apply 4 g topically 4 (four) times daily.   dicyclomine (BENTYL) 10 MG capsule Take 1 capsule (10 mg total) by mouth 3 (three) times daily before meals.   gabapentin (NEURONTIN) 300 MG capsule TAKE 1 CAPSULE(300 MG) BY MOUTH THREE TIMES DAILY   hydrochlorothiazide (MICROZIDE) 12.5 MG capsule TAKE 1 CAPSULE(12.5 MG) BY MOUTH DAILY   insulin lispro (HUMALOG) 100 UNIT/ML KwikPen INJECT 12-20 UNITS UNDER THE SKIN 2-3 TIMES DAILY   Insulin Pen Needle (CAREFINE PEN NEEDLES) 32G X 4 MM MISC Use 4x a day   lovastatin (MEVACOR) 40 MG tablet TAKE 1 TABLET(40 MG) BY MOUTH AT BEDTIME   metFORMIN (GLUCOPHAGE-XR) 500 MG 24 hr tablet TAKE 1 TABLET(500 MG) BY MOUTH TWICE DAILY   nitroGLYCERIN (NITROSTAT) 0.4 MG SL tablet Place 1 tablet (0.4 mg total) under the tongue every 5 (five) minutes as needed for chest pain.   polyethylene glycol (MIRALAX / GLYCOLAX) 17 g packet Take 17 g by mouth as needed.   Semaglutide, 1 MG/DOSE, (OZEMPIC, 1 MG/DOSE,) 4 MG/3ML SOPN Inject 1 mg into the skin once a week.   TOUJEO SOLOSTAR 300 UNIT/ML Solostar Pen Inject 34-36 Units  into the skin at bedtime.   methocarbamol (ROBAXIN) 500 MG tablet Take 1 tablet (500 mg total) by mouth every 8 (eight) hours as needed for muscle spasms. (Patient not taking: Reported on 12/10/2022)   No facility-administered encounter medications on file as of 12/10/2022.    Allergies (verified) Tramadol, Other, Chlorhexidine, and Oxycodone hcl   History: Past Medical History:  Diagnosis Date   Abdominal pain, chronic, left lower quadrant 08/25/2018   Elevated LFTs    Family history of heart disease 12/04/2018   Fatty liver    on [place].    Hyperlipemia    Hypertension    Inguinal hernia    2020 CT    Morbid obesity (HCC) 07/04/2015   Numbness and tingling in right hand 03/01/2021   Palpitation 03/08/2021   Stage 3b chronic kidney disease (HCC)    Thoracic spondylosis with myelopathy    Umbilical hernia    2020 CT   Uncontrolled type 2 diabetes with neuropathy    Weakness of left leg 07/04/2015   Wheelchair bound    paraplegia of Leg - Left   Past Surgical History:  Procedure Laterality Date   APPENDECTOMY  1969   BREAST BIOPSY Right    CATARACT EXTRACTION     May 40981   LUMBAR DISC SURGERY     LUMBAR LAMINECTOMY/DECOMPRESSION MICRODISCECTOMY N/A 07/06/2015   Procedure: Thoracic ten- eleven laminectomy for spinal canal decompression;  Surgeon: Coletta Memos, MD;  Location: [facility];  Service: Orthopedics;  Laterality: N/A;   Family History  Problem Relation Age of Onset   Diabetes Mother    Heart disease Mother    Pancreatic cancer Sister    Kidney disease Brother        Two brothers on ESRD   Amblyopia Neg Hx    Blindness Neg Hx    Cataracts Neg Hx    Glaucoma Neg Hx    Macular degeneration Neg Hx    Retinal detachment Neg Hx    Strabismus Neg Hx    Retinitis pigmentosa Neg Hx    Colon cancer Neg Hx    Esophageal cancer Neg Hx    Social History   Socioeconomic History   Marital status: Divorced    Spouse name: Not on file   Number of children: Not on file   Years of education: Not on file   Highest education level: Not on file  Occupational History   Not on file  Tobacco Use   Smoking status: Never   Smokeless tobacco: Never  Vaping Use   Vaping status: Never Used  Substance and Sexual Activity   Alcohol use: No   Drug use: No   Sexual activity: Not Currently  Other Topics Concern   Not on file  Social History Narrative   Not on file   Social Determinants of Health   Financial Resource Strain: Low Risk  (12/10/2022)   Overall Financial Resource Strain (CARDIA)    Difficulty of Paying Living Expenses: Not hard at all  Food Insecurity: No  Food Insecurity (12/10/2022)   Hunger Vital Sign    Worried About Running Out of Food in the Last Year: Never true    Ran Out of Food in the Last Year: Never true  Transportation Needs: No Transportation Needs (12/10/2022)   PRAPARE - Administrator, Civil Service (Medical): No    Lack of Transportation (Non-Medical): No  Physical Activity: Insufficiently Active (12/10/2022)   Exercise Vital Sign    Days of  Exercise per Week: 3 days    Minutes of Exercise per Session: 30 min  Stress: No Stress Concern Present (12/10/2022)   [employer] of Occupational Health - Occupational Stress Questionnaire    Feeling of Stress : Not at all  Social Connections: Socially Isolated (12/10/2022)   Social Connection and Isolation Panel [NHANES]    Frequency of Communication with Friends and Family: More than three times a week    Frequency of Social Gatherings with Friends and Family: Twice a week    Attends Religious Services: Never    Database administrator or Organizations: No    Attends Engineer, structural: Never    Marital Status: Divorced    Tobacco Counseling Counseling given: Not Answered   Clinical Intake:  Pre-visit preparation completed: Yes  Pain : 0-10 Pain Score: 4  Pain Type: Chronic pain Pain Location: Groin Pain Orientation: Left Pain Descriptors / Indicators: Aching Pain Onset: More than a month ago Pain Frequency: Constant     Nutritional Risks: None Diabetes: Yes CBG done?: No Did pt. bring in CBG monitor from home?: No  How often do you need to have someone help you when you read instructions, pamphlets, or other written materials from your doctor or pharmacy?: 1 - Never  Interpreter Needed?: No  Information entered by :: NAllen LPN   Activities of Daily Living    12/10/2022   10:42 AM  In your present state of health, do you have any difficulty performing the following activities:  Hearing? 0  Vision? 0  Difficulty  concentrating or making decisions? 0  Walking or climbing stairs? 1  Comment in wheelchair  Dressing or bathing? 0  Doing errands, shopping? 0  Preparing Food and eating ? N  Using the Toilet? N  In the past six months, have you accidently leaked urine? N  Do you have problems with loss of bowel control? N  Managing your Medications? N  Managing your Finances? N  Housekeeping or managing your Housekeeping? N    Patient Care Team: Early, Sung Amabile, NP as PCP - General (Nurse Practitioner) Carlus Pavlov, MD as Consulting Physician (Internal Medicine) Genice Rouge, MD as Consulting Physician (Physical Medicine and Rehabilitation) Rennis Chris, MD as Consulting Physician (Ophthalmology)  Indicate any recent Medical Services you may have received from other than [facility] providers in the past year (date may be approximate).     Assessment:   This is a routine wellness examination for [place].  Hearing/Vision screen Hearing Screening - Comments:: Denies hearing issues Vision Screening - Comments:: Regular eye exams, Dr. Vanessa Soni   Goals Addressed             This Visit's Progress    Patient Stated       12/10/2022, wants to walk       Depression Screen    12/10/2022   10:50 AM 06/28/2022   11:14 AM 06/10/2022   11:21 AM 12/07/2021    1:34 PM 10/15/2021   10:34 AM 07/30/2021   11:11 AM 04/20/2021   11:28 AM  PHQ 2/9 Scores  PHQ - 2 Score 0 1 0 2 0 0 6  PHQ- 9 Score 0   2       Fall Risk    12/10/2022   10:49 AM 06/28/2022   11:13 AM 06/10/2022   11:21 AM 03/06/2022   10:53 AM 12/07/2021    1:33 PM  Fall Risk   Falls in the past year? 0 1 0 0  0  Number falls in past yr: 0 0 0  0  Injury with Fall? 0 1 0  0  Comment  fell on knees rt. leg a ittle weak,     Risk for fall due to : Medication side effect;Impaired mobility;Impaired balance/gait Other (Comment)   Impaired mobility;Impaired balance/gait;Medication side effect  Follow up Falls prevention discussed;Falls  evaluation completed Falls evaluation completed   Falls prevention discussed;Education provided;Falls evaluation completed    MEDICARE RISK AT HOME: Medicare Risk at Home Any stairs in or around the home?: No If so, are there any without handrails?: No Home free of loose throw rugs in walkways, pet beds, electrical cords, etc?: Yes Adequate lighting in your home to reduce risk of falls?: Yes Life alert?: No Use of a cane, walker or w/c?: Yes Grab bars in the bathroom?: Yes Shower chair or bench in shower?: Yes Elevated toilet seat or a handicapped toilet?: No  TIMED UP AND GO:  Was the test performed?  No    Cognitive Function:        12/10/2022   10:50 AM 12/07/2021    1:39 PM  6CIT Screen  What Year? 0 points 0 points  What month? 0 points 0 points  What time? 0 points 0 points  Count back from 20 0 points 0 points  Months in reverse 0 points 0 points  Repeat phrase 0 points 2 points  Total Score 0 points 2 points    Immunizations Immunization History  Administered Date(s) Administered   PFIZER(Purple Top)SARS-COV-2 Vaccination 09/16/2019, 10/12/2019   Pneumococcal Polysaccharide-23 04/10/2011    TDAP status: Due, Education has been provided regarding the importance of this vaccine. Advised may receive this vaccine at local pharmacy or Health Dept. Aware to provide a copy of the vaccination record if obtained from local pharmacy or Health Dept. Verbalized acceptance and understanding.  Flu Vaccine status: Declined, Education has been provided regarding the importance of this vaccine but patient still declined. Advised may receive this vaccine at local pharmacy or Health Dept. Aware to provide a copy of the vaccination record if obtained from local pharmacy or Health Dept. Verbalized acceptance and understanding.  Pneumococcal vaccine status: Declined,  Education has been provided regarding the importance of this vaccine but patient still declined. Advised may receive  this vaccine at local pharmacy or Health Dept. Aware to provide a copy of the vaccination record if obtained from local pharmacy or Health Dept. Verbalized acceptance and understanding.   Covid-19 vaccine status: Declined, Education has been provided regarding the importance of this vaccine but patient still declined. Advised may receive this vaccine at local pharmacy or Health Dept.or vaccine clinic. Aware to provide a copy of the vaccination record if obtained from local pharmacy or Health Dept. Verbalized acceptance and understanding.  Qualifies for Shingles Vaccine? Yes   Zostavax completed No   Shingrix Completed?: No.    Education has been provided regarding the importance of this vaccine. Patient has been advised to call insurance company to determine out of pocket expense if they have not yet received this vaccine. Advised may also receive vaccine at local pharmacy or Health Dept. Verbalized acceptance and understanding.  Screening Tests Health Maintenance  Topic Date Due   Colonoscopy  Never done   Diabetic kidney evaluation - Urine ACR  07/25/2017   Diabetic kidney evaluation - eGFR measurement  10/16/2022   HEMOGLOBIN A1C  02/20/2023   FOOT EXAM  04/05/2023   OPHTHALMOLOGY EXAM  07/10/2023   Medicare  Annual Wellness (AWV)  12/10/2023   DEXA SCAN  Completed   Hepatitis C Screening  Completed   HPV VACCINES  Aged Out   DTaP/Tdap/Td  Discontinued   Pneumonia Vaccine 64+ Years old  Discontinued   INFLUENZA VACCINE  Discontinued   MAMMOGRAM  Discontinued   COVID-19 Vaccine  Discontinued   Zoster Vaccines- Shingrix  Discontinued    Health Maintenance  Health Maintenance Due  Topic Date Due   Colonoscopy  Never done   Diabetic kidney evaluation - Urine ACR  07/25/2017   Diabetic kidney evaluation - eGFR measurement  10/16/2022    Colorectal cancer screening: declines  Mammogram status: No longer required due to age.  Bone Density status: Completed 01/06/2018.   Lung  Cancer Screening: (Low Dose CT Chest recommended if Age 84-80 years, 20 pack-year currently smoking OR have quit w/in 15years.) does not qualify.   Lung Cancer Screening Referral: no  Additional Screening:  Hepatitis C Screening: does qualify; Completed 10/25/2015  Vision Screening: Recommended annual ophthalmology exams for early detection of glaucoma and other disorders of the eye. Is the patient up to date with their annual eye exam?  Yes  Who is the provider or what is the name of the office in which the patient attends annual eye exams? Dr. Vanessa Macaria If pt is not established with a provider, would they like to be referred to a provider to establish care? No .   Dental Screening: Recommended annual dental exams for proper oral hygiene  Diabetic Foot Exam: Diabetic Foot Exam: Overdue, Pt has been advised about the importance in completing this exam. Pt is scheduled for diabetic foot exam on next appointment.  Community Resource Referral / Chronic Care Management: CRR required this visit?  No   CCM required this visit?  No     Plan:     I have personally reviewed and noted the following in the patient's chart:   Medical and social history Use of alcohol, tobacco or illicit drugs  Current medications and supplements including opioid prescriptions. Patient is not currently taking opioid prescriptions. Functional ability and status Nutritional status Physical activity Advanced directives List of other physicians Hospitalizations, surgeries, and ER visits in previous 12 months Vitals Screenings to include cognitive, depression, and falls Referrals and appointments  In addition, I have reviewed and discussed with patient certain preventive protocols, quality metrics, and best practice recommendations. A written personalized care plan for preventive services as well as general preventive health recommendations were provided to patient.     Barb Merino, LPN   [id number]/[id number]    After Visit Summary: (Pick Up) Due to this being a telephonic visit, with patients personalized plan was offered to patient and patient has requested to Pick up at office.  Nurse Notes: none

## 2022-12-11 ENCOUNTER — Encounter: Payer: 59 | Attending: Physical Medicine and Rehabilitation | Admitting: Physical Medicine and Rehabilitation

## 2022-12-11 DIAGNOSIS — M7918 Myalgia, other site: Secondary | ICD-10-CM | POA: Insufficient documentation

## 2022-12-25 ENCOUNTER — Other Ambulatory Visit: Payer: Self-pay

## 2022-12-25 ENCOUNTER — Other Ambulatory Visit (HOSPITAL_COMMUNITY): Payer: Self-pay

## 2022-12-25 MED FILL — Hydrochlorothiazide Cap 12.5 MG: ORAL | 90 days supply | Qty: 90 | Fill #0 | Status: AC

## 2022-12-25 MED FILL — Lovastatin Tab 40 MG: ORAL | 90 days supply | Qty: 90 | Fill #0 | Status: AC

## 2022-12-26 ENCOUNTER — Other Ambulatory Visit (HOSPITAL_COMMUNITY): Payer: Self-pay

## 2022-12-26 ENCOUNTER — Other Ambulatory Visit: Payer: Self-pay

## 2022-12-27 ENCOUNTER — Other Ambulatory Visit: Payer: Self-pay

## 2022-12-27 ENCOUNTER — Other Ambulatory Visit: Payer: Self-pay | Admitting: Nurse Practitioner

## 2022-12-30 ENCOUNTER — Other Ambulatory Visit: Payer: Self-pay

## 2022-12-30 ENCOUNTER — Other Ambulatory Visit (HOSPITAL_COMMUNITY): Payer: Self-pay

## 2022-12-30 MED ORDER — GABAPENTIN 300 MG PO CAPS
300.0000 mg | ORAL_CAPSULE | Freq: Three times a day (TID) | ORAL | 0 refills | Status: DC
  Filled 2022-12-30 (×2): qty 270, 90d supply, fill #0

## 2023-01-06 DIAGNOSIS — Z794 Long term (current) use of insulin: Secondary | ICD-10-CM | POA: Diagnosis not present

## 2023-01-06 DIAGNOSIS — E118 Type 2 diabetes mellitus with unspecified complications: Secondary | ICD-10-CM | POA: Diagnosis not present

## 2023-01-13 ENCOUNTER — Other Ambulatory Visit: Payer: Self-pay

## 2023-01-13 ENCOUNTER — Other Ambulatory Visit (HOSPITAL_COMMUNITY): Payer: Self-pay

## 2023-01-13 ENCOUNTER — Encounter: Payer: Self-pay | Admitting: Nurse Practitioner

## 2023-01-13 ENCOUNTER — Ambulatory Visit (INDEPENDENT_AMBULATORY_CARE_PROVIDER_SITE_OTHER): Payer: 59 | Admitting: Nurse Practitioner

## 2023-01-13 VITALS — BP 124/78 | HR 71

## 2023-01-13 DIAGNOSIS — Z794 Long term (current) use of insulin: Secondary | ICD-10-CM | POA: Diagnosis not present

## 2023-01-13 DIAGNOSIS — E559 Vitamin D deficiency, unspecified: Secondary | ICD-10-CM | POA: Diagnosis not present

## 2023-01-13 DIAGNOSIS — E66812 Obesity, class 2: Secondary | ICD-10-CM

## 2023-01-13 DIAGNOSIS — I10 Essential (primary) hypertension: Secondary | ICD-10-CM | POA: Diagnosis not present

## 2023-01-13 DIAGNOSIS — K760 Fatty (change of) liver, not elsewhere classified: Secondary | ICD-10-CM

## 2023-01-13 DIAGNOSIS — E782 Mixed hyperlipidemia: Secondary | ICD-10-CM

## 2023-01-13 DIAGNOSIS — I152 Hypertension secondary to endocrine disorders: Secondary | ICD-10-CM

## 2023-01-13 DIAGNOSIS — Z993 Dependence on wheelchair: Secondary | ICD-10-CM | POA: Diagnosis not present

## 2023-01-13 DIAGNOSIS — G629 Polyneuropathy, unspecified: Secondary | ICD-10-CM | POA: Insufficient documentation

## 2023-01-13 DIAGNOSIS — E1159 Type 2 diabetes mellitus with other circulatory complications: Secondary | ICD-10-CM | POA: Diagnosis not present

## 2023-01-13 DIAGNOSIS — E538 Deficiency of other specified B group vitamins: Secondary | ICD-10-CM | POA: Diagnosis not present

## 2023-01-13 DIAGNOSIS — E1121 Type 2 diabetes mellitus with diabetic nephropathy: Secondary | ICD-10-CM

## 2023-01-13 DIAGNOSIS — N1832 Chronic kidney disease, stage 3b: Secondary | ICD-10-CM | POA: Diagnosis not present

## 2023-01-13 DIAGNOSIS — R5383 Other fatigue: Secondary | ICD-10-CM

## 2023-01-13 DIAGNOSIS — K582 Mixed irritable bowel syndrome: Secondary | ICD-10-CM

## 2023-01-13 DIAGNOSIS — E162 Hypoglycemia, unspecified: Secondary | ICD-10-CM | POA: Insufficient documentation

## 2023-01-13 DIAGNOSIS — B379 Candidiasis, unspecified: Secondary | ICD-10-CM

## 2023-01-13 DIAGNOSIS — I700 Atherosclerosis of aorta: Secondary | ICD-10-CM | POA: Diagnosis not present

## 2023-01-13 DIAGNOSIS — G8222 Paraplegia, incomplete: Secondary | ICD-10-CM

## 2023-01-13 DIAGNOSIS — G6289 Other specified polyneuropathies: Secondary | ICD-10-CM | POA: Diagnosis not present

## 2023-01-13 MED ORDER — GVOKE HYPOPEN 2-PACK 1 MG/0.2ML ~~LOC~~ SOAJ
SUBCUTANEOUS | 3 refills | Status: AC
  Filled 2023-01-13: qty 0.4, fill #0
  Filled 2023-01-13: qty 0.4, 28d supply, fill #0

## 2023-01-13 MED ORDER — DICYCLOMINE HCL 10 MG PO CAPS
10.0000 mg | ORAL_CAPSULE | Freq: Three times a day (TID) | ORAL | 3 refills | Status: AC
  Filled 2023-01-13 (×2): qty 270, 90d supply, fill #0
  Filled 2023-12-02: qty 270, 90d supply, fill #1

## 2023-01-13 MED ORDER — GABAPENTIN 300 MG PO CAPS
300.0000 mg | ORAL_CAPSULE | Freq: Three times a day (TID) | ORAL | 3 refills | Status: AC
  Filled 2023-01-13 – 2023-03-26 (×3): qty 270, 90d supply, fill #0
  Filled 2023-06-25: qty 270, 90d supply, fill #1
  Filled 2023-09-26: qty 270, 90d supply, fill #2

## 2023-01-13 MED ORDER — HYDROCHLOROTHIAZIDE 12.5 MG PO CAPS
12.5000 mg | ORAL_CAPSULE | Freq: Every day | ORAL | 1 refills | Status: DC
  Filled 2023-01-13 – 2023-03-26 (×3): qty 90, 90d supply, fill #0
  Filled 2023-06-25: qty 90, 90d supply, fill #1

## 2023-01-13 MED ORDER — LOVASTATIN 40 MG PO TABS
40.0000 mg | ORAL_TABLET | Freq: Every day | ORAL | 1 refills | Status: DC
  Filled 2023-01-13 – 2023-03-26 (×3): qty 90, 90d supply, fill #0
  Filled 2023-06-25: qty 90, 90d supply, fill #1

## 2023-01-13 MED ORDER — AMLODIPINE BESYLATE 10 MG PO TABS
10.0000 mg | ORAL_TABLET | Freq: Every day | ORAL | 3 refills | Status: AC
  Filled 2023-01-13 (×2): qty 90, 90d supply, fill #0
  Filled 2023-03-26: qty 90, 90d supply, fill #1
  Filled 2023-06-25: qty 90, 90d supply, fill #2
  Filled 2023-12-02: qty 90, 90d supply, fill #3

## 2023-01-13 MED ORDER — FLUCONAZOLE 150 MG PO TABS
150.0000 mg | ORAL_TABLET | Freq: Once | ORAL | 2 refills | Status: AC
  Filled 2023-01-13 (×2): qty 2, 2d supply, fill #0

## 2023-01-13 NOTE — Assessment & Plan Note (Signed)
Experiences numbness and tingling, particularly at night, managed with gabapentin. Symptoms likely exacerbated by shoulder positioning and spinal curvature. Discussed that adjusting shoulder position can alleviate symptoms. - Continue gabapentin, 2 pills at night and 1 additional pill if needed - Schedule follow-up with spinal cord specialist, Dr. Cleotilde Neer

## 2023-01-13 NOTE — Assessment & Plan Note (Addendum)
Two episodes of hypoglycemia last week due to insufficient food intake. Managed with orange juice or sugary foods. Discussed emergency glucagon injection pen for severe hypoglycemia, especially if unable to take oral glucose. - Provide script for glucagon pen and instructions for use

## 2023-01-13 NOTE — Progress Notes (Signed)
[name], DNP, AGNP-c [facility]  [address] [place], [place] [address] [phone]  ESTABLISHED PATIENT- Chronic Health and/or Follow-Up Visit  Blood pressure 124/78, pulse 71.    Cassandra Stewart is a 72 y.o. year old female presenting today for evaluation and management of chronic conditions.   History of Present Illness [name], a known diabetic on Ozempic and insulin, reports satisfactory blood sugar control. She has experienced occasional episodes of hypoglycemia, which she attributes to inadequate food intake. She manages these episodes with oral glucose intake, typically in the form of orange juice. She does not have emergency glucose injections at home but is interested in these to aid in her management. Particularly these will be helpful in the event she is experiencing a hypoglycemic episode while in bed and is unable to get up quickly to eat (due to being wheelchair bound), or if she has an episode and is unable to take oral glucose, a friend/family member could provide treatment. She sees Dr. Faustino Congress every 4 months for management.    She also reports intermittent pruritus and discharge, suggestive of recurrent yeast infections, which she has not been treating due to uncertainty about appropriate medication. She reports that this did happen recently during a transition period of her medication when her blood sugars were not well controlled.   The patient also has a history of chronic pain in the right side, which she manages with Dicyclomine. She reports that the pain has become more constant over time.    She also experiences numbness in the right arm, particularly at night, which she manages by elevating the hand. She attributes this numbness to a known issue with her shoulder and a curving spine, as diagnosed by her spinal cord doctor. She takes Gabapentin, usually two pills at bedtime, to manage this numbness and tingling, and reports that it seems  to help.  The patient also reports a history of a sprained left ankle, which occurred several years ago during a fall. She notes that the left foot swells in humid weather or when it is not elevated, and that the swelling can cause pain up to the knee.  The patient lives alone and manages her daily activities independently. She expresses concern about episodes of hypoglycemia, particularly at night, due to the difficulty she experiences in getting out of bed quickly. She has emergency pull cords in her bedroom and bathroom, but finds the one in the bedroom difficult to reach.  The patient recently switched from Trulicity to [facility] for her diabetes management due to supply issues with the former. She reports that she liked Trulicity and that it helped control her blood sugar levels effectively. She also notes that Ozempic has helped curb her appetite, but she has been feeling tired all the time and is unsure why. She takes Vitamin B once a week and Vitamin D daily.  All ROS negative with exception of what is listed above.   PHYSICAL EXAM Physical Exam Vitals and nursing note reviewed.  Constitutional:      General: She is not in acute distress.    Appearance: Normal appearance.  HENT:     Head: Normocephalic.  Eyes:     Conjunctiva/sclera: Conjunctivae normal.  Neck:     Vascular: No carotid bruit.  Cardiovascular:     Rate and Rhythm: Normal rate and regular rhythm.     Pulses: Normal pulses.     Heart sounds: Normal heart sounds. No murmur heard. Pulmonary:     Effort: Pulmonary  effort is normal.     Breath sounds: Normal breath sounds.  Musculoskeletal:     Right lower leg: No edema.     Left lower leg: No edema.  Skin:    General: Skin is warm and dry.     Capillary Refill: Capillary refill takes less than 2 seconds.  Neurological:     General: No focal deficit present.     Mental Status: She is alert and oriented to person, place, and time.     Motor: Weakness and atrophy  present.  Psychiatric:        Mood and Affect: Mood normal.      PLAN Problem List Items Addressed This Visit     Hypertension associated with type 2 diabetes mellitus (HCC)    Blood pressure is well-controlled. No recent use of nitroglycerin. - Refill amlodipine prescription at new pharmacy [facility])      Relevant Medications   amLODipine (NORVASC) 10 MG tablet   lovastatin (MEVACOR) 40 MG tablet   hydrochlorothiazide (MICROZIDE) 12.5 MG capsule   Glucagon (GVOKE HYPOPEN 2-PACK) 1 MG/0.2ML SOAJ   Other Relevant Orders   CMP14+EGFR   CBC with Differential/Platelet   Hemoglobin A1c   Wheelchair dependence    Left ankle swelling and pain, especially in humid weather or prolonged sitting. Severe sprain in 2017-2018 causing long-term instability and swelling. - Continue current management with elevation and pain control       Type 2 diabetes mellitus with diabetic nephropathy (HCC) - Primary    Currently on Ozempic and insulin. Blood sugars are reportedly well-managed. Reports occasional yeast infections, likely due to elevated blood sugars. Recent episode of hyperglycemia due to incorrect Ozempic dosing, now resolved. Discussed that hyperglycemia can promote yeast growth. Explained that Diflucan is a one-time treatment that usually resolves symptoms quickly. - Prescribe Diflucan for yeast infections as needed - Continue current diabetes management with Ozempic and insulin - Check vitamin D and B levels to assess for fatigue      Relevant Medications   amLODipine (NORVASC) 10 MG tablet   lovastatin (MEVACOR) 40 MG tablet   hydrochlorothiazide (MICROZIDE) 12.5 MG capsule   Glucagon (GVOKE HYPOPEN 2-PACK) 1 MG/0.2ML SOAJ   Other Relevant Orders   CMP14+EGFR   CBC with Differential/Platelet   Hemoglobin A1c   Hypoglycemia    Two episodes of hypoglycemia last week due to insufficient food intake. Managed with orange juice or sugary foods. Discussed emergency  glucagon injection pen for severe hypoglycemia, especially if unable to take oral glucose. - Provide script for glucagon pen and instructions for use      Relevant Medications   Glucagon (GVOKE HYPOPEN 2-PACK) 1 MG/0.2ML SOAJ   Peripheral neuropathy    Experiences numbness and tingling, particularly at night, managed with gabapentin. Symptoms likely exacerbated by shoulder positioning and spinal curvature. Discussed that adjusting shoulder position can alleviate symptoms. - Continue gabapentin, 2 pills at night and 1 additional pill if needed - Schedule follow-up with spinal cord specialist, Dr. Cleotilde Neer      Relevant Medications   gabapentin (NEURONTIN) 300 MG capsule   Other Relevant Orders   TSH   CKD (chronic kidney disease) stage 3, GFR 30-59 ml/min (HCC)   Relevant Medications   amLODipine (NORVASC) 10 MG tablet   lovastatin (MEVACOR) 40 MG tablet   hydrochlorothiazide (MICROZIDE) 12.5 MG capsule   Other Relevant Orders   CMP14+EGFR   CBC with Differential/Platelet   Hemoglobin A1c   Fatty liver disease,  nonalcoholic   Relevant Medications   amLODipine (NORVASC) 10 MG tablet   lovastatin (MEVACOR) 40 MG tablet   hydrochlorothiazide (MICROZIDE) 12.5 MG capsule   Other Relevant Orders   CMP14+EGFR   CBC with Differential/Platelet   Hemoglobin A1c   Low serum vitamin B12   Relevant Orders   Vitamin B12   Atherosclerosis of aorta (HCC)   Relevant Medications   amLODipine (NORVASC) 10 MG tablet   lovastatin (MEVACOR) 40 MG tablet   hydrochlorothiazide (MICROZIDE) 12.5 MG capsule   Other Relevant Orders   CMP14+EGFR   CBC with Differential/Platelet   Hemoglobin A1c   Class 2 severe obesity due to excess calories with serious comorbidity and body mass index (BMI) of 36.0 to 36.9 in adult [facility])   Relevant Medications   Glucagon (GVOKE HYPOPEN 2-PACK) 1 MG/0.2ML SOAJ   Other Visit Diagnoses     Essential hypertension       Relevant Medications   amLODipine (NORVASC)  10 MG tablet   lovastatin (MEVACOR) 40 MG tablet   hydrochlorothiazide (MICROZIDE) 12.5 MG capsule   Mixed hyperlipidemia       Relevant Medications   amLODipine (NORVASC) 10 MG tablet   lovastatin (MEVACOR) 40 MG tablet   hydrochlorothiazide (MICROZIDE) 12.5 MG capsule   Vitamin D deficiency       Relevant Orders   VITAMIN D 25 Hydroxy (Vit-D Deficiency, Fractures)   Other fatigue       Relevant Orders   VITAMIN D 25 Hydroxy (Vit-D Deficiency, Fractures)   Vitamin B12   TSH   Irritable bowel syndrome with both constipation and diarrhea       Relevant Medications   dicyclomine (BENTYL) 10 MG capsule   Candida infection       Relevant Medications   fluconazole (DIFLUCAN) 150 MG tablet       Return in about 6 months (around 07/13/2023) for Med Management 30.  [name], DNP, AGNP-c

## 2023-01-13 NOTE — Assessment & Plan Note (Signed)
Currently on Ozempic and insulin. Blood sugars are reportedly well-managed. Reports occasional yeast infections, likely due to elevated blood sugars. Recent episode of hyperglycemia due to incorrect Ozempic dosing, now resolved. Discussed that hyperglycemia can promote yeast growth. Explained that Diflucan is a one-time treatment that usually resolves symptoms quickly. - Prescribe Diflucan for yeast infections as needed - Continue current diabetes management with Ozempic and insulin - Check vitamin D and B levels to assess for fatigue

## 2023-01-13 NOTE — Assessment & Plan Note (Signed)
Blood pressure is well-controlled. No recent use of nitroglycerin. - Refill amlodipine prescription at new pharmacy [facility])

## 2023-01-13 NOTE — Assessment & Plan Note (Signed)
Left ankle swelling and pain, especially in humid weather or prolonged sitting. Severe sprain in 2017-2018 causing long-term instability and swelling. - Continue current management with elevation and pain control

## 2023-01-14 DIAGNOSIS — M4710 Other spondylosis with myelopathy, site unspecified: Secondary | ICD-10-CM | POA: Diagnosis not present

## 2023-01-14 DIAGNOSIS — R262 Difficulty in walking, not elsewhere classified: Secondary | ICD-10-CM | POA: Diagnosis not present

## 2023-01-14 DIAGNOSIS — L98429 Non-pressure chronic ulcer of back with unspecified severity: Secondary | ICD-10-CM | POA: Diagnosis not present

## 2023-01-14 DIAGNOSIS — G959 Disease of spinal cord, unspecified: Secondary | ICD-10-CM | POA: Diagnosis not present

## 2023-01-14 LAB — HEMOGLOBIN A1C
Est. average glucose Bld gHb Est-mCnc: 189 mg/dL
Hgb A1c MFr Bld: 8.2 % — ABNORMAL HIGH (ref 4.8–5.6)

## 2023-01-14 LAB — CBC WITH DIFFERENTIAL/PLATELET
Basophils Absolute: 0 10*3/uL (ref 0.0–0.2)
Basos: 1 %
EOS (ABSOLUTE): 0.1 10*3/uL (ref 0.0–0.4)
Eos: 2 %
Hematocrit: 40.1 % (ref 34.0–46.6)
Hemoglobin: 13.2 g/dL (ref 11.1–15.9)
Immature Grans (Abs): 0 10*3/uL (ref 0.0–0.1)
Immature Granulocytes: 1 %
Lymphocytes Absolute: 2.1 10*3/uL (ref 0.7–3.1)
Lymphs: 35 %
MCH: 29.3 pg (ref 26.6–33.0)
MCHC: 32.9 g/dL (ref 31.5–35.7)
MCV: 89 fL (ref 79–97)
Monocytes Absolute: 0.5 10*3/uL (ref 0.1–0.9)
Monocytes: 8 %
Neutrophils Absolute: 3.4 10*3/uL (ref 1.4–7.0)
Neutrophils: 53 %
Platelets: 276 10*3/uL (ref 150–450)
RBC: 4.5 x10E6/uL (ref 3.77–5.28)
RDW: 12.4 % (ref 11.7–15.4)
WBC: 6.1 10*3/uL (ref 3.4–10.8)

## 2023-01-14 LAB — VITAMIN B12: Vitamin B-12: 1032 pg/mL (ref 232–1245)

## 2023-01-14 LAB — CMP14+EGFR
ALT: 22 [IU]/L (ref 0–32)
AST: 26 [IU]/L (ref 0–40)
Albumin: 4.2 g/dL (ref 3.8–4.8)
Alkaline Phosphatase: 82 [IU]/L (ref 44–121)
BUN/Creatinine Ratio: 14 (ref 12–28)
BUN: 15 mg/dL (ref 8–27)
Bilirubin Total: 0.3 mg/dL (ref 0.0–1.2)
CO2: 21 mmol/L (ref 20–29)
Calcium: 9.7 mg/dL (ref 8.7–10.3)
Chloride: 103 mmol/L (ref 96–106)
Creatinine, Ser: 1.09 mg/dL — ABNORMAL HIGH (ref 0.57–1.00)
Globulin, Total: 2.9 g/dL (ref 1.5–4.5)
Glucose: 182 mg/dL — ABNORMAL HIGH (ref 70–99)
Potassium: 5 mmol/L (ref 3.5–5.2)
Sodium: 144 mmol/L (ref 134–144)
Total Protein: 7.1 g/dL (ref 6.0–8.5)
eGFR: 53 mL/min/{1.73_m2} — ABNORMAL LOW (ref >59)

## 2023-01-14 LAB — TSH: TSH: 1.87 u[IU]/mL (ref 0.450–4.500)

## 2023-01-14 LAB — VITAMIN D 25 HYDROXY (VIT D DEFICIENCY, FRACTURES): Vit D, 25-Hydroxy: 83.9 ng/mL (ref 30.0–100.0)

## 2023-01-29 DIAGNOSIS — M4710 Other spondylosis with myelopathy, site unspecified: Secondary | ICD-10-CM | POA: Diagnosis not present

## 2023-01-29 DIAGNOSIS — G959 Disease of spinal cord, unspecified: Secondary | ICD-10-CM | POA: Diagnosis not present

## 2023-02-05 DIAGNOSIS — Z794 Long term (current) use of insulin: Secondary | ICD-10-CM | POA: Diagnosis not present

## 2023-02-05 DIAGNOSIS — E118 Type 2 diabetes mellitus with unspecified complications: Secondary | ICD-10-CM | POA: Diagnosis not present

## 2023-02-10 ENCOUNTER — Telehealth: Payer: Self-pay | Admitting: Nurse Practitioner

## 2023-02-10 NOTE — Telephone Encounter (Signed)
Pt dropped off transportation forms to be completed and faxed to [facility]  sent back in folder

## 2023-03-07 DIAGNOSIS — E118 Type 2 diabetes mellitus with unspecified complications: Secondary | ICD-10-CM | POA: Diagnosis not present

## 2023-03-07 DIAGNOSIS — Z794 Long term (current) use of insulin: Secondary | ICD-10-CM | POA: Diagnosis not present

## 2023-03-26 ENCOUNTER — Other Ambulatory Visit (HOSPITAL_COMMUNITY): Payer: Self-pay

## 2023-03-26 ENCOUNTER — Other Ambulatory Visit: Payer: Self-pay

## 2023-03-27 ENCOUNTER — Telehealth: Payer: Self-pay

## 2023-03-27 NOTE — Progress Notes (Signed)
Care Guide Pharmacy Note  03/27/2023 Name: Thirza Pellicano MRN: [id number] DOB: 1950-05-14  Referred By: Tollie Eth, NP Reason for referral: Care Coordination ([facility] Diabetes. )   Shaquilla Kehres is a 73 y.o. year old female who is a primary care patient of Early, Sung Amabile, NP.  Reygan Heagle was referred to the pharmacist for assistance related to: DMII  Successful contact was made with the patient to discuss pharmacy services.  Patient declines engagement at this time. Contact information was provided to the patient should they wish to reach out for assistance at a later time.  [facility]  [facility] Management Assistant Direct Dial: [phone]  Fax: [phone]

## 2023-04-02 ENCOUNTER — Ambulatory Visit: Payer: 59 | Admitting: Family Medicine

## 2023-04-02 ENCOUNTER — Telehealth: Payer: Self-pay

## 2023-04-02 NOTE — Telephone Encounter (Signed)
 Pt uses a DME company and they need most recent chart notes. Last visit was 07/2022. Please get pt scheduled.

## 2023-04-03 NOTE — Telephone Encounter (Signed)
 Patient is scheduled for 04/07/2023

## 2023-04-04 ENCOUNTER — Encounter: Payer: 59 | Attending: Physical Medicine and Rehabilitation | Admitting: Physical Medicine and Rehabilitation

## 2023-04-04 ENCOUNTER — Encounter: Payer: Self-pay | Admitting: Physical Medicine and Rehabilitation

## 2023-04-04 ENCOUNTER — Other Ambulatory Visit: Payer: Self-pay

## 2023-04-04 ENCOUNTER — Other Ambulatory Visit (HOSPITAL_COMMUNITY): Payer: Self-pay

## 2023-04-04 VITALS — BP 143/83 | HR 66 | Ht 64.0 in | Wt 218.0 lb

## 2023-04-04 DIAGNOSIS — G8222 Paraplegia, incomplete: Secondary | ICD-10-CM | POA: Insufficient documentation

## 2023-04-04 DIAGNOSIS — G8929 Other chronic pain: Secondary | ICD-10-CM | POA: Diagnosis not present

## 2023-04-04 DIAGNOSIS — M545 Low back pain, unspecified: Secondary | ICD-10-CM | POA: Insufficient documentation

## 2023-04-04 DIAGNOSIS — Z993 Dependence on wheelchair: Secondary | ICD-10-CM | POA: Diagnosis not present

## 2023-04-04 DIAGNOSIS — M25512 Pain in left shoulder: Secondary | ICD-10-CM | POA: Insufficient documentation

## 2023-04-04 MED ORDER — ACETAMINOPHEN-CODEINE 300-30 MG PO TABS
1.0000 | ORAL_TABLET | Freq: Three times a day (TID) | ORAL | 0 refills | Status: AC | PRN
  Filled 2023-04-04 (×2): qty 21, 7d supply, fill #0

## 2023-04-04 NOTE — Patient Instructions (Addendum)
 Pt is a 73 yr old R handed female  With hx of HTN, DM- poorly controlled per chart; obesity- BMI >35, HLD, Vit B levels are high- with hx of thoracic spondylosis/myelopathy and LE weakness- mainly LLE weak.  Here for f/u on weakness. w/c dependent. In power w/c.  Also new R wrist pain- ulnar styloid pain/TTP Here for f/u on Thoracic nontraumatic myelopathy- incomplete paraplegia.  Also has carpal tunnel syndrome on R.  Here for f/u on incomplete paraplegia.         Ortho referral - For L shoulder severe pain and limitation in ROM- cannot lift >75 degrees without severe pain   2. L shoulder xray. Send to [facility] for Xray- go when convenient- look up radiology number cone- or can call [phone]- operator- should get to radiology-- to get an Appointment- for SCAT -need xray before allowed to get MRI- so will start with xray.   3. Will send to PT for home exercise program-  needs to do home exercise program 5 days a week minimum- no matter what- 10-15 minutes daily-  Pt is an incomplete paraplegia- at T10- who is having more middle and low back pain- please see MRI- last done 11/2019- also having L shoulder pain-  being sent to Ortho- needs core exercises and HEP to be able to do 5 days/week at home so can improve back pain.  Can see Ortho first and see if any restrictions from Ortho  4. Tylenol  #3- short term- for L shoulder pain-  if needs long term- will need UDS and opiate contract-  #3- up to 3x/day as needed 1 week supply- to have you call me and let me know if helping or not. If it's helpful , will write for 30 days-   5. Cr 1.09- so don't want to cause it to get worse with Rx NSAIDs, can do the topical anti-inflammatory- Diclofenac  gel- can apply up to 4x/day- apply to front, top, back side and underneath --   6. F/U in 3 months- double appt- call me in 1 month to let me know how things doing- or can mychart.

## 2023-04-04 NOTE — Progress Notes (Signed)
 Subjective:    Patient ID: Cassandra Stewart, female    DOB: Jul 19, 1950, 73 y.o.   MRN: [id number]  HPI  Pt is a 73 yr old R handed female  With hx of HTN, DM- poorly controlled per chart; obesity- BMI >35, HLD, Vit B levels are high- with hx of thoracic spondylosis/myelopathy and LE weakness- mainly LLE weak.  Here for f/u on weakness. w/c dependent. In power w/c.  Also new R wrist pain- ulnar styloid pain/TTP Here for f/u on Thoracic nontraumatic myelopathy- incomplete paraplegia.  Also has carpal tunnel syndrome on R.  Here for f/u on incomplete paraplegia.        Cannot Lift L shoulder more than ~ 75 degrees  When lifts it, causes major pain.  Occurred 2 weeks ago-  Doing work in bedroom and slid off bed- braced self on bed and dresser and pushed self back up- R shoulder got better after a few days, but L shoulder hasn't.   Hasn't seen Ortho-   Is leaning to side- to relieve pressure off spine to take pain away- sometimes it helps, not always.   Having more back pain than used to.   Has never tried Tylenol  #3-  Takes tylenol  arthritis-   Still doing tennis balls- 2x/week- that stretching helps alleviate some of the pain.    Pain Inventory Average Pain 7 Pain Right Now 7 My pain is aching  In the last 24 hours, has pain interfered with the following? General activity 7 Relation with others 2 Enjoyment of life 7 What TIME of day is your pain at its worst? varies Sleep (in general) Fair  Pain is worse with: some activites Pain improves with:  nothing helps really  Relief from Meds: 0  Family History  Problem Relation Age of Onset   Diabetes Mother    Heart disease Mother    Pancreatic cancer Sister    Kidney disease Brother        Two brothers on ESRD   Amblyopia Neg Hx    Blindness Neg Hx    Cataracts Neg Hx    Glaucoma Neg Hx    Macular degeneration Neg Hx    Retinal detachment Neg Hx    Strabismus Neg Hx    Retinitis pigmentosa Neg Hx    Colon  cancer Neg Hx    Esophageal cancer Neg Hx    Social History   Socioeconomic History   Marital status: Divorced    Spouse name: Not on file   Number of children: Not on file   Years of education: Not on file   Highest education level: Not on file  Occupational History   Not on file  Tobacco Use   Smoking status: Never   Smokeless tobacco: Never  Vaping Use   Vaping status: Never Used  Substance and Sexual Activity   Alcohol use: No   Drug use: No   Sexual activity: Not Currently  Other Topics Concern   Not on file  Social History Narrative   Not on file   Social Drivers of Health   Financial Resource Strain: Low Risk  (12/10/2022)   Overall Financial Resource Strain (CARDIA)    Difficulty of Paying Living Expenses: Not hard at all  Food Insecurity: No Food Insecurity (12/10/2022)   Hunger Vital Sign    Worried About Running Out of Food in the Last Year: Never true    Ran Out of Food in the Last Year: Never true  Transportation Needs: No  Transportation Needs (12/10/2022)   PRAPARE - Administrator, Civil Service (Medical): No    Lack of Transportation (Non-Medical): No  Physical Activity: Insufficiently Active (12/10/2022)   Exercise Vital Sign    Days of Exercise per Week: 3 days    Minutes of Exercise per Session: 30 min  Stress: No Stress Concern Present (12/10/2022)   [employer] of Occupational Health - Occupational Stress Questionnaire    Feeling of Stress : Not at all  Social Connections: Socially Isolated (12/10/2022)   Social Connection and Isolation Panel [NHANES]    Frequency of Communication with Friends and Family: More than three times a week    Frequency of Social Gatherings with Friends and Family: Twice a week    Attends Religious Services: Never    Database Administrator or Organizations: No    Attends Engineer, Structural: Never    Marital Status: Divorced   Past Surgical History:  Procedure Laterality Date    APPENDECTOMY  1969   BREAST BIOPSY Right    CATARACT EXTRACTION     May [address]8983   LUMBAR DISC SURGERY     LUMBAR LAMINECTOMY/DECOMPRESSION MICRODISCECTOMY N/A 07/06/2015   Procedure: Thoracic ten- eleven laminectomy for spinal canal decompression;  Surgeon: Rockey Peru, MD;  Location: [facility];  Service: Orthopedics;  Laterality: N/A;   Past Surgical History:  Procedure Laterality Date   APPENDECTOMY  1969   BREAST BIOPSY Right    CATARACT EXTRACTION     May [address]83   LUMBAR DISC SURGERY     LUMBAR LAMINECTOMY/DECOMPRESSION MICRODISCECTOMY N/A 07/06/2015   Procedure: Thoracic ten- eleven laminectomy for spinal canal decompression;  Surgeon: Rockey Peru, MD;  Location: [facility];  Service: Orthopedics;  Laterality: N/A;   Past Medical History:  Diagnosis Date   Abdominal pain, chronic, left lower quadrant 08/25/2018   Elevated LFTs    Family history of heart disease 12/04/2018   Fatty liver    on US .    Hyperlipemia    Hypertension    Inguinal hernia    2020 CT   Morbid obesity (HCC) 07/04/2015   Numbness and tingling in right hand 03/01/2021   Palpitation 03/08/2021   Personal history of noncompliance with medical treatment, presenting hazards to health 01/29/2017   Stage 3b chronic kidney disease (HCC)    Thoracic spondylosis with myelopathy    Umbilical hernia    2020 CT   Uncontrolled type 2 diabetes with neuropathy    Weakness of left leg 07/04/2015   Wheelchair bound    paraplegia of Leg - Left   Ht 5' 4 (1.626 m)   Wt 218 lb (98.9 kg)   BMI 37.42 kg/m   Opioid Risk Score:   Fall Risk Score:  `1  Depression screen [facility] 2/9     04/04/2023   11:22 AM 12/10/2022   10:50 AM 06/28/2022   11:14 AM 06/10/2022   11:21 AM 12/07/2021    1:34 PM 10/15/2021   10:34 AM 07/30/2021   11:11 AM  Depression screen PHQ 2/9  Decreased Interest 0 0 0 0 1 0 0  Down, Depressed, Hopeless 0 0 1 0 1 0 0  PHQ - 2 Score 0 0 1 0 2 0 0  Altered sleeping  0   0    Tired, decreased  energy  0   0    Change in appetite  0   0    Feeling bad or failure about yourself  0   0    Trouble concentrating  0   0    Moving slowly or fidgety/restless  0   0    Suicidal thoughts  0   0    PHQ-9 Score  0   2    Difficult doing work/chores  Not difficult at all   Somewhat difficult        Review of Systems  Musculoskeletal:        B/L shoulder pain Left side/abdomen pain   All other systems reviewed and are negative.     Objective:   Physical Exam  Awake, alert, appropriate, in scooter/power w/c- with built in cushion, NAD Cannot lift arm >75 degrees with severe pain TTP over L AC joint as well as L biceps origin/anteriorly Very little TTP over posterior shoulder, however empty can test (+) for impingement syndrome Pain with resisted biceps flexion Increased pain with external >internal rotation and with abduction >flexion        Assessment & Plan:   Pt is a 73 yr old R handed female  With hx of HTN, DM- poorly controlled per chart; obesity- BMI >35, HLD, Vit B levels are high- with hx of thoracic spondylosis/myelopathy and LE weakness- mainly LLE weak.  Here for f/u on weakness. w/c dependent. In power w/c.  Also new R wrist pain- ulnar styloid pain/TTP Here for f/u on Thoracic nontraumatic myelopathy- incomplete paraplegia.  Also has carpal tunnel syndrome on R.  Here for f/u on incomplete paraplegia.         Ortho referral - For L shoulder severe pain and limitation in ROM- cannot lift >75 degrees without severe pain   2. L shoulder xray. Send to [facility] for Xray- go when convenient- look up radiology number cone- or can call [phone]- operator- should get to radiology-- to get an Appointment- for SCAT -need xray before allowed to get MRI- so will start with xray.   3. Will send to PT for home exercise program-  needs to do home exercise program 5 days a week minimum- no matter what- 10-15 minutes daily-  Pt is an incomplete paraplegia- at T10- who  is having more middle and low back pain- please see MRI- last done 11/2019- also having L shoulder pain-  being sent to Ortho- needs core exercises and HEP to be able to do 5 days/week at home so can improve back pain.   4. Tylenol  #3- short term- for L shoulder pain-  if needs long term- will need UDS and opiate contract-  #3- up to 3x/day as needed 1 week supply- to have you call me and let me know if helping or not. If it's helpful , will write for 30 days-   5. Cr 1.09- so don't want to cause it to get worse with Rx NSAIDs, can do the topical anti-inflammatory- Diclofenac  gel- can apply up to 4x/day- apply to front, top, back side and underneath --   6. F/U in 3 months- double appt- call me in 1 month to let me know how things doing- or can mychart.

## 2023-04-07 ENCOUNTER — Ambulatory Visit: Payer: 59 | Admitting: Internal Medicine

## 2023-04-07 ENCOUNTER — Telehealth: Payer: Self-pay | Admitting: Internal Medicine

## 2023-04-07 NOTE — Progress Notes (Deleted)
 Patient ID: Cassandra Stewart, female   DOB: Apr 17, 1950, 73 y.o.   MRN: [id number]   HPI: Cassandra Stewart is a 73 y.o.-year-old female, returning for follow-up for DM2, dx in 1992, insulin-dependent since 2006, uncontrolled, with complications (CKD stage 3, PN, DR).  Last visit 7 months ago  Interim history: No increased urination, blurry vision, nausea.  She has more fatigue with Ozempic than with Trulicity.  However, she feels that this is helping her appetite. She has swelling in her legs.   Reviewed HbA1c levels: Lab Results  Component Value Date   HGBA1C 8.2 (H) 01/13/2023   HGBA1C 7.5 (A) 08/21/2022   HGBA1C 7.5 (A) 04/04/2022   HGBA1C 7.3 (A) 11/28/2021   HGBA1C 7.7 (A) 07/19/2021   HGBA1C 8.5 (A) 04/09/2021   HGBA1C 7.1 (A) 12/06/2020   HGBA1C 8.5 (A) 08/04/2020   HGBA1C 9.9 (A) 04/06/2020   HGBA1C 7.4 (A) 12/02/2019   HGBA1C 9.3 (A) 07/29/2019   HGBA1C 9.6 (A) 03/30/2019   HGBA1C 9.8 (A) 12/28/2018   HGBA1C 10.7 (A) 07/13/2018   HGBA1C 6.8 (A) 03/16/2018   HGBA1C 6.1 (A) 11/12/2017   HGBA1C 9.0 07/11/2017   HGBA1C 9.0 04/14/2017   HGBA1C 8.7% 10/24/2016   HGBA1C 9.6% 07/25/2016  03/16/2018: HbA1c calculated from fructosamine 6.28%  Pt was on a regimen of: - Metformin 500 mg 1x a day with dinner.  She had diarrhea with a higher dose. - Toujeo 45 units in am - Humalog 18 units 2-3x a day, before meals Tried: Actos, Lantus.  Now on: - Metformin ER 500 mg 1-2x a day with meals. - Trulicity 1.5 >> 3 mg weekly - restarted 03/2021 - intermittently out 2/2 lack of supply (off for 3 weeks) >> Ozempic 0.5 >> 1 >> 2 mg weekly. - Toujeo 35 >> 28 >> 36 >> 40 >> 34 >> 36 units at night - Humalog 7-12 units before meals and 5 >> 7 units before snack at night >> 12-20 units before the meals (higher doses with lunch), and 7 units before the snack at night   At last visit she was not taking blood sugars that she did not have transmitters ([facility] did not send them).  Now taking  >4 times a day with her Dexcom CGM (with receiver):  Previously:  Previously:  Lowest sugar was 40 >> 49 >> 49; she has hypoglycemia awareness in the 80s. Highest sugar was 357 >> 393 (cereal: fruity pebbles) >> 260.  Glucometer: Freestyle  Pt's meals are: - Breakfast/brunch: egg, bacon, toast >> no b'fast  - Lunch: snack mostly (no Humalog) >> Meals on Wheels - Dinner: chicken/fish + vegetables >> moved before 7 pm - Snacks: Belvita cracker if hungry  -+ Stage III CKD; last BUN/creatinine:  Lab Results  Component Value Date   BUN 15 01/13/2023   BUN 18 10/15/2021   CREATININE 1.09 (H) 01/13/2023   CREATININE 1.00 10/15/2021   Lab Results  Component Value Date   GFRAA 54 (L) 03/02/2020   GFRAA 59 (L) 08/31/2019   GFRAA 58 (L) 03/24/2019   GFRAA 59 (L) 03/12/2019   GFRAA 55 (L) 03/08/2019  Previously on lisinopril 10, but stopped due to hyperkalemia.   -+ HL; last set of lipids: Lab Results  Component Value Date   CHOL 158 10/15/2021   HDL 72 10/15/2021   LDLCALC 62 10/15/2021   TRIG 143 10/15/2021   CHOLHDL 2.2 10/15/2021  On lovastatin 40 mg daily.  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 Vanessa Stewart).  She has history of cataract surgery OU.  -+ Numbness and tingling in her fingers-on Neurontin 300 mg 3x a day per PCP.  Latest foot exam 04/04/2022. On ASA 81.  Low vitamin B12:  Reviewed B12 levels: Lab Results  Component Value Date   VITAMINB12 1,032 01/13/2023   VITAMINB12 >1500 (H) 11/28/2021   VITAMINB12 >1550 (H) 12/06/2020   VITAMINB12 >1526 (H) 07/29/2019   VITAMINB12 >1500 (H) 03/30/2019   VITAMINB12 >1500 (H) 12/28/2018   VITAMINB12 261 03/16/2018   VITAMINB12 300 04/14/2017   VITAMINB12 478 10/25/2015  05/01/2016: Vit B12 248.  We initially started 5000 mcg B12 daily, which was then decreased to 2500 mcg daily and, then to 1000 mcg daily >> every other day >> 2x a week - 11/2020 >> decreased to once a week  11/2021.  She continues to be in a wheelchair as her left leg is very weak-believed to be from a herniated intervertebral disc.  She had surgery for this in 2016 but the strength did not improve. She was in the emergency room on 10/24/2020 for R wrist pain and swelling.  She also sees Dr. Jiles Stewart.  No fracture. Has a brace. Her ulnar nerve is affected by using her home wheelchair. She was in the emergency room 11/28/2020 for chest pain.  Troponins were negative.  D-dimer was still elevated. She continues to have neck and back pain.   She has an extensive FH of heart disease - women in her family. Daughter died after her heart stopped.  ROS: + See HPI  I reviewed pt's medications, allergies, PMH, social hx, family hx, and changes were documented in the history of present illness. Otherwise, unchanged from my initial visit note.  Past Medical History:  Diagnosis Date   Abdominal pain, chronic, left lower quadrant 08/25/2018   Elevated LFTs    Family history of heart disease 12/04/2018   Fatty liver    on [place].    Hyperlipemia    Hypertension    Inguinal hernia    2020 CT   Morbid obesity (HCC) 07/04/2015   Numbness and tingling in right hand 03/01/2021   Palpitation 03/08/2021   Personal history of noncompliance with medical treatment, presenting hazards to health 01/29/2017   Stage 3b chronic kidney disease (HCC)    Thoracic spondylosis with myelopathy    Umbilical hernia    2020 CT   Uncontrolled type 2 diabetes with neuropathy    Weakness of left leg 07/04/2015   Wheelchair bound    paraplegia of Leg - Left   Past Surgical History:  Procedure Laterality Date   APPENDECTOMY  1969   BREAST BIOPSY Right    CATARACT EXTRACTION     May 16109   LUMBAR DISC SURGERY     LUMBAR LAMINECTOMY/DECOMPRESSION MICRODISCECTOMY N/A 07/06/2015   Procedure: Thoracic ten- eleven laminectomy for spinal canal decompression;  Surgeon: Cassandra Memos, MD;  Location: [facility];  Service: Orthopedics;   Laterality: N/A;   Social History   Social History   Marital status: Divorced    Spouse name: N/A   Number of children: 0   Occupational History   None   Social History Main Topics   Smoking status: Never Smoker   Smokeless tobacco: Never Used   Alcohol use No   Drug use: No   Current Outpatient Medications on File Prior to Visit  Medication Sig Dispense Refill   ACCU-CHEK GUIDE test strip USE AS DIRECTED TO TEST  BLOOD SUGAR FOUR TIMES DAILY 400 strip 1   acetaminophen (TYLENOL) 650 MG CR tablet Take 650 mg by mouth every 8 (eight) hours as needed for pain.     acetaminophen-codeine (TYLENOL #3) 300-30 MG tablet Take 1 tablet by mouth every 8 (eight) hours as needed for moderate pain (pain score 4-6). 21 tablet 0   amLODipine (NORVASC) 10 MG tablet Take 1 tablet (10 mg total) by mouth daily. 90 tablet 3   aspirin EC 81 MG tablet Take 81 mg by mouth every evening.     Cholecalciferol (VITAMIN D PO) Take 1,000 mg by mouth daily.      Continuous Blood Gluc Receiver (DEXCOM G6 RECEIVER) DEVI by Does not apply route.     Cyanocobalamin (VITAMIN B 12 PO) Take 5,000 mg by mouth daily.     diclofenac Sodium (VOLTAREN) 1 % GEL Apply 4 g topically 4 (four) times daily. 100 g 0   dicyclomine (BENTYL) 10 MG capsule Take 1 capsule (10 mg total) by mouth 3 (three) times daily before meals. 270 capsule 3   gabapentin (NEURONTIN) 300 MG capsule Take 1 capsule (300 mg total) by mouth 3 (three) times daily. 270 capsule 3   Glucagon (GVOKE HYPOPEN 2-PACK) 1 MG/0.2ML SOAJ Use for emergency low blood sugar treatment. 0.4 mL 3   hydrochlorothiazide (MICROZIDE) 12.5 MG capsule Take 1 capsule (12.5 mg total) by mouth daily. 90 capsule 1   insulin lispro (HUMALOG) 100 UNIT/ML KwikPen INJECT 12-20 UNITS UNDER THE SKIN 2-3 TIMES DAILY 45 mL 3   Insulin Pen Needle (CAREFINE PEN NEEDLES) 32G X 4 MM MISC Use 4x a day 300 each 3   lovastatin (MEVACOR) 40 MG tablet Take 1 tablet (40 mg total) by mouth at  bedtime. 90 tablet 1   metFORMIN (GLUCOPHAGE-XR) 500 MG 24 hr tablet TAKE 1 TABLET(500 MG) BY MOUTH TWICE DAILY 180 tablet 1   nitroGLYCERIN (NITROSTAT) 0.4 MG SL tablet Place 1 tablet (0.4 mg total) under the tongue every 5 (five) minutes as needed for chest pain. 25 tablet 3   polyethylene glycol (MIRALAX / GLYCOLAX) 17 g packet Take 17 g by mouth as needed.     Semaglutide, 1 MG/DOSE, (OZEMPIC, 1 MG/DOSE,) 4 MG/3ML SOPN Inject 1 mg into the skin once a week. 9 mL 3   TOUJEO SOLOSTAR 300 UNIT/ML Solostar Pen Inject 34-36 Units into the skin at bedtime. 12 mL 3   No current facility-administered medications on file prior to visit.   Allergies  Allergen Reactions   Tramadol Nausea And Vomiting    Severe N/V- with Tramadol   Other     PT PREFERS TO NOT HAVE ANY NARCOTIC MEDICATIONS   Chlorhexidine Rash    Burning after using CHG wipes-used for surgery   Oxycodone Hcl     Other reaction(s): Hallucination Marked hallucinations and palpitations following dose of 10mg  on 04/30/2015    Family History  Problem Relation Age of Onset   Diabetes Mother    Heart disease Mother    Pancreatic cancer Sister    Kidney disease Brother        Two brothers on ESRD   Amblyopia Neg Hx    Blindness Neg Hx    Cataracts Neg Hx    Glaucoma Neg Hx    Macular degeneration Neg Hx    Retinal detachment Neg Hx    Strabismus Neg Hx    Retinitis pigmentosa Neg Hx    Colon cancer Neg Hx    Esophageal  cancer Neg Hx    Pt has FH of DM in M, MGM, PGM, M aunt, uncles.  PE: There were no vitals taken for this visit.patient in wheelchair, could not be weighed Wt Readings from Last 3 Encounters:  04/04/23 218 lb (98.9 kg)  04/04/22 218 lb (98.9 kg)  12/07/21 212 lb (96.2 kg)   Constitutional: overweight, in NAD, in wheelchair Eyes: EOMI, no exophthalmos ENT: no thyromegaly, no cervical lymphadenopathy Cardiovascular: RRR, No MRG, + L ankle edema - pitting (after fall 2017) Respiratory: CTA  B Musculoskeletal: + paraplegia Skin: no rashes Neurological: no tremor with outstretched hands  ASSESSMENT: 1. DM2, insulin-dependent, uncontrolled, with complications - CKD stage 3 - PN - DR  - Freestyle libre CGM was not covered by the insurance  2. Low B12  3. HL  PLAN:  1. Patient with longstanding, uncontrolled, type 2 diabetes, on basal/bolus insulin regimen along with metformin and weekly GLP-1 receptor agonist, with suboptimal control.  At last visit, HbA1c was stable, at 7.5%.  At that time, I suggested to switch from Trulicity to [facility] for stronger effect on the blood sugars.  We increased the Ozempic dose afterwards.  Sugars were mostly fluctuating within the target range but they were increasing after each meal, more so after dinner.  They remained mostly above target in the middle of the night and then improving until morning. -Since last visit, she had another HbA1c obtained 3 months ago and this was even higher, at 8.2%. CGM interpretation: -At today's visit, we reviewed her CGM downloads: It appears that *** of values are in target range (goal >70%), while *** are higher than 180 (goal <25%), and *** are lower than 70 (goal <4%).  The calculated average blood sugar is ***.  The projected HbA1c for the next 3 months (GMI) is ***. -Reviewing the CGM trends, ***  -Reviewing the CGM trends, sugars are mostly fluctuating within the target range, but they - I suggested to:  Patient Instructions  Please continue: - Metformin ER 500 mg 1-2x a day with meals - Toujeo 34-36 units at night - Humalog 12-20 units before the meals (higher doses with lunch), and 7 units before the snack at night  - Ozempic 2 mg weekly  Please return in 4 months.   - we checked her HbA1c: 7%  - advised to check sugars at different times of the day - 4x a day, rotating check times - advised for yearly eye exams >> she is UTD - return to clinic in 3-4 months  2. Low B12 -Patient has a  history of low B12 for which we started supplementation with p.o. vitamin B12 -We had to reduce the dose subsequently as her B12 was elevated.  She is currently taking 1000 mcg every week -Latest B12 level was normal: Lab Results  Component Value Date   VITAMINB12 1,032 01/13/2023   VITAMINB12 >1500 (H) 11/28/2021   3. HL -Reviewed latest lipid panel from 2023: Fractions at goal: Lab Results  Component Value Date   CHOL 158 10/15/2021   HDL 72 10/15/2021   LDLCALC 62 10/15/2021   TRIG 143 10/15/2021   CHOLHDL 2.2 10/15/2021  -She is on Mevacor 40 mg daily without side effects -She is due for another lipid panel  Carlus Pavlov, MD PhD [facility] Endocrinology

## 2023-04-07 NOTE — Telephone Encounter (Signed)
 Patient was identified as falling into the [place] Measure - Diabetes.   Patient was: Appointment scheduled with primary care provider in the next 30 days. endocrinology

## 2023-04-10 NOTE — Progress Notes (Addendum)
Patient ID: Cassandra Stewart, female   DOB: [birth year], 73 y.o.   MRN: [id number]  This note was precharted 04/07/2023.  HPI: Cassandra Stewart is a 73 y.o.-year-old female, returning for follow-up for DM2, dx in 1992, insulin-dependent since 2006, uncontrolled, with complications (CKD stage 3, PN, DR).  Last visit 7 months ago  Interim history: No increased urination, blurry vision, nausea.   She recently slid off the bed >> pain in her shoulders.    Reviewed HbA1c levels: Lab Results  Component Value Date   HGBA1C 8.2 (H) 01/13/2023   HGBA1C 7.5 (A) 08/21/2022   HGBA1C 7.5 (A) 04/04/2022   HGBA1C 7.3 (A) 11/28/2021   HGBA1C 7.7 (A) 07/19/2021   HGBA1C 8.5 (A) 04/09/2021   HGBA1C 7.1 (A) 12/06/2020   HGBA1C 8.5 (A) 08/04/2020   HGBA1C 9.9 (A) 04/06/2020   HGBA1C 7.4 (A) 12/02/2019   HGBA1C 9.3 (A) 07/29/2019   HGBA1C 9.6 (A) 03/30/2019   HGBA1C 9.8 (A) 12/28/2018   HGBA1C 10.7 (A) 07/13/2018   HGBA1C 6.8 (A) 03/16/2018   HGBA1C 6.1 (A) 11/12/2017   HGBA1C 9.0 07/11/2017   HGBA1C 9.0 04/14/2017   HGBA1C 8.7% 10/24/2016   HGBA1C 9.6% 07/25/2016  03/16/2018: HbA1c calculated from fructosamine 6.28%  Pt was on a regimen of: - Metformin 500 mg 1x a day with dinner.  She had diarrhea with a higher dose. - Toujeo 45 units in am - Humalog 18 units 2-3x a day, before meals Tried: Actos, Lantus.  Now on: - Metformin ER 500 mg 1-2x a day with meals. - Trulicity 1.5 >> 3 mg weekly - restarted 03/2021 - intermittently out 2/2 lack of supply (off for 3 weeks) >> Ozempic 0.5 >> 1 >> 2 mg weekly >> actually using 1 mg weekly - Toujeo 35 >> 28 >> 36 >> 40 >> 34 >> 36 units at night - Humalog 7-12 units before meals and 5 >> 7 units before snack at night >> 12-20 units before the meals (higher doses with lunch), and 7 units before the snack at night   At last visit she was not taking blood sugars that she did not have transmitters ([facility] did not send them).  Now taking >4 times a day  with her Dexcom CGM (with receiver):   Previously:  Previously:  Lowest sugar was 40 >> 49 >> 49 >> 69; she has hypoglycemia awareness in the 80s. Highest sugar was 357 >> 393 (cereal: fruity pebbles) >> 260 >> 283  Glucometer: Freestyle  Pt's meals are: - Breakfast/brunch: egg, bacon, toast >> no b'fast  - Lunch: snack mostly (no Humalog) >> Meals on Wheels - Dinner: chicken/fish + vegetables >> moved before 7 pm - Snacks: Belvita cracker if hungry  -+ Stage III CKD; last BUN/creatinine:  Lab Results  Component Value Date   BUN 15 01/13/2023   BUN 18 10/15/2021   CREATININE 1.09 (H) 01/13/2023   CREATININE 1.00 10/15/2021   Lab Results  Component Value Date   MICRALBCREAT 16 07/25/2016   MICRALBCREAT 57 (H) 10/25/2015   Lab Results  Component Value Date   GFRAA 54 (L) 03/02/2020   GFRAA 59 (L) 08/31/2019   GFRAA 58 (L) 03/24/2019   GFRAA 59 (L) 03/12/2019   GFRAA 55 (L) 03/08/2019  Previously on lisinopril 10, but stopped due to hyperkalemia.   -+ HL; last set of lipids: Lab Results  Component Value Date   CHOL 158 10/15/2021   HDL 72 10/15/2021   LDLCALC 62 10/15/2021   TRIG  143 10/15/2021   CHOLHDL 2.2 10/15/2021  On lovastatin 40 mg daily.  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 Vanessa Domnique).  She has history of cataract surgery OU.  -+ Numbness and tingling in her fingers-on Neurontin 300 mg 3x a day per PCP.  Latest foot exam 04/04/2022. On ASA 81.  Low vitamin B12:  Reviewed B12 levels: Lab Results  Component Value Date   VITAMINB12 1,032 01/13/2023   VITAMINB12 >1500 (H) 11/28/2021   VITAMINB12 >1550 (H) 12/06/2020   VITAMINB12 >1526 (H) 07/29/2019   VITAMINB12 >1500 (H) 03/30/2019   VITAMINB12 >1500 (H) 12/28/2018   VITAMINB12 261 03/16/2018   VITAMINB12 300 04/14/2017   VITAMINB12 478 10/25/2015  05/01/2016: Vit B12 248.  We initially started 5000 mcg B12 daily, which was then decreased to 2500 mcg daily and, then  to 1000 mcg daily >> every other day >> 2x a week - 11/2020 >> decreased to once a week 11/2021.  She continues to be in a wheelchair as her left leg is very weak-believed to be from a herniated intervertebral disc.  She had surgery for this in 2016 but the strength did not improve. She was in the emergency room on 10/24/2020 for R wrist pain and swelling.  She also sees Dr. Jiles Garter.  No fracture. Has a brace. Her ulnar nerve is affected by using her home wheelchair. She was in the emergency room 11/28/2020 for chest pain.  Troponins were negative.  D-dimer was still elevated. She continues to have neck and back pain.   She has an extensive FH of heart disease - women in her family. Daughter died after her heart stopped.  ROS: + See HPI  I reviewed pt's medications, allergies, PMH, social hx, family hx, and changes were documented in the history of present illness. Otherwise, unchanged from my initial visit note.  Past Medical History:  Diagnosis Date   Abdominal pain, chronic, left lower quadrant 08/25/2018   Elevated LFTs    Family history of heart disease 12/04/2018   Fatty liver    on [place].    Hyperlipemia    Hypertension    Inguinal hernia    2020 CT   Morbid obesity (HCC) 07/04/2015   Numbness and tingling in right hand 03/01/2021   Palpitation 03/08/2021   Personal history of noncompliance with medical treatment, presenting hazards to health 01/29/2017   Stage 3b chronic kidney disease (HCC)    Thoracic spondylosis with myelopathy    Umbilical hernia    2020 CT   Uncontrolled type 2 diabetes with neuropathy    Weakness of left leg 07/04/2015   Wheelchair bound    paraplegia of Leg - Left   Past Surgical History:  Procedure Laterality Date   APPENDECTOMY  1969   BREAST BIOPSY Right    CATARACT EXTRACTION     May 16109   LUMBAR DISC SURGERY     LUMBAR LAMINECTOMY/DECOMPRESSION MICRODISCECTOMY N/A 07/06/2015   Procedure: Thoracic ten- eleven laminectomy for spinal canal  decompression;  Surgeon: Coletta Memos, MD;  Location: [facility];  Service: Orthopedics;  Laterality: N/A;   Social History   Social History   Marital status: Divorced    Spouse name: N/A   Number of children: 0   Occupational History   None   Social History Main Topics   Smoking status: Never Smoker   Smokeless tobacco: Never Used   Alcohol use No   Drug use: No   Current  Outpatient Medications on File Prior to Visit  Medication Sig Dispense Refill   ACCU-CHEK GUIDE test strip USE AS DIRECTED TO TEST BLOOD SUGAR FOUR TIMES DAILY 400 strip 1   acetaminophen (TYLENOL) 650 MG CR tablet Take 650 mg by mouth every 8 (eight) hours as needed for pain.     acetaminophen-codeine (TYLENOL #3) 300-30 MG tablet Take 1 tablet by mouth every 8 (eight) hours as needed for moderate pain (pain score 4-6). 21 tablet 0   amLODipine (NORVASC) 10 MG tablet Take 1 tablet (10 mg total) by mouth daily. 90 tablet 3   aspirin EC 81 MG tablet Take 81 mg by mouth every evening.     Cholecalciferol (VITAMIN D PO) Take 1,000 mg by mouth daily.      Continuous Blood Gluc Receiver (DEXCOM G6 RECEIVER) DEVI by Does not apply route.     Cyanocobalamin (VITAMIN B 12 PO) Take 5,000 mg by mouth daily.     diclofenac Sodium (VOLTAREN) 1 % GEL Apply 4 g topically 4 (four) times daily. 100 g 0   dicyclomine (BENTYL) 10 MG capsule Take 1 capsule (10 mg total) by mouth 3 (three) times daily before meals. 270 capsule 3   gabapentin (NEURONTIN) 300 MG capsule Take 1 capsule (300 mg total) by mouth 3 (three) times daily. 270 capsule 3   Glucagon (GVOKE HYPOPEN 2-PACK) 1 MG/0.2ML SOAJ Use for emergency low blood sugar treatment. 0.4 mL 3   hydrochlorothiazide (MICROZIDE) 12.5 MG capsule Take 1 capsule (12.5 mg total) by mouth daily. 90 capsule 1   insulin lispro (HUMALOG) 100 UNIT/ML KwikPen INJECT 12-20 UNITS UNDER THE SKIN 2-3 TIMES DAILY 45 mL 3   Insulin Pen Needle (CAREFINE PEN NEEDLES) 32G X 4 MM MISC Use 4x a day 300  each 3   lovastatin (MEVACOR) 40 MG tablet Take 1 tablet (40 mg total) by mouth at bedtime. 90 tablet 1   metFORMIN (GLUCOPHAGE-XR) 500 MG 24 hr tablet TAKE 1 TABLET(500 MG) BY MOUTH TWICE DAILY 180 tablet 1   nitroGLYCERIN (NITROSTAT) 0.4 MG SL tablet Place 1 tablet (0.4 mg total) under the tongue every 5 (five) minutes as needed for chest pain. 25 tablet 3   polyethylene glycol (MIRALAX / GLYCOLAX) 17 g packet Take 17 g by mouth as needed.     Semaglutide, 1 MG/DOSE, (OZEMPIC, 1 MG/DOSE,) 4 MG/3ML SOPN Inject 1 mg into the skin once a week. 9 mL 3   TOUJEO SOLOSTAR 300 UNIT/ML Solostar Pen Inject 34-36 Units into the skin at bedtime. 12 mL 3   No current facility-administered medications on file prior to visit.   Allergies  Allergen Reactions   Tramadol Nausea And Vomiting    Severe N/V- with Tramadol   Other     PT PREFERS TO NOT HAVE ANY NARCOTIC MEDICATIONS   Chlorhexidine Rash    Burning after using CHG wipes-used for surgery   Oxycodone Hcl     Other reaction(s): Hallucination Marked hallucinations and palpitations following dose of 10mg  on 04/30/2015    Family History  Problem Relation Age of Onset   Diabetes Mother    Heart disease Mother    Pancreatic cancer Sister    Kidney disease Brother        Two brothers on ESRD   Amblyopia Neg Hx    Blindness Neg Hx    Cataracts Neg Hx    Glaucoma Neg Hx    Macular degeneration Neg Hx    Retinal detachment Neg Hx  Strabismus Neg Hx    Retinitis pigmentosa Neg Hx    Colon cancer Neg Hx    Esophageal cancer Neg Hx    Pt has FH of DM in M, MGM, PGM, M aunt, uncles.  PE: BP 122/70   Pulse 81   SpO2 97% patient in wheelchair, could not be weighed Wt Readings from Last 3 Encounters:  04/04/23 218 lb (98.9 kg)  04/04/22 218 lb (98.9 kg)  12/07/21 212 lb (96.2 kg)   Constitutional: overweight, in NAD, in wheelchair Eyes: EOMI, no exophthalmos ENT: no thyromegaly, no cervical lymphadenopathy Cardiovascular: RRR, No MRG,  + L ankle edema - pitting (after fall 2017) Respiratory: CTA B Musculoskeletal: + paraplegia Skin: no rashes Neurological: no tremor with outstretched hands Diabetic Foot Exam - Simple   Simple Foot Form Diabetic Foot exam was performed with the following findings: Yes 04/11/2023 11:35 AM  Visual Inspection No deformities, no ulcerations, no other skin breakdown bilaterally: Yes Sensation Testing Intact to touch and monofilament testing bilaterally: Yes Pulse Check Posterior Tibialis and Dorsalis pulse intact bilaterally: Yes Comments    ASSESSMENT: 1. DM2, insulin-dependent, uncontrolled, with complications - CKD stage 3 - PN - DR  - Freestyle libre CGM was not covered by the insurance  2. Low B12  3. HL  PLAN:  1. Patient with longstanding, uncontrolled, type 2 diabetes, on basal/bolus insulin regimen along with metformin and weekly GLP-1 receptor agonist, with suboptimal control.  At last visit, HbA1c was stable, at 7.5%.  At that time, I suggested to switch from Trulicity to [facility] [place] for stronger effect on the blood sugars.  We increased the Ozempic dose afterwards.  Sugars were mostly fluctuating within the target range but they were increasing after each meal, more so after dinner.  They remained mostly above target in the middle of the night and then improving until morning. -Since last visit, she had another HbA1c obtained 3 months ago and this was even higher, at 8.2%. CGM interpretation: -At today's visit, we reviewed her CGM downloads: It appears that 77% of values are in target range (goal >70%), while 23% are higher than 180 (goal <25%), and 0% are lower than 70 (goal <4%).  The calculated average blood sugar is 156.  The projected HbA1c for the next 3 months (GMI) is 7.0%. -Reviewing the CGM trends, sugars improved significantly since last visit, and they were almost all at goal in the 2 weeks prior to the last 2 weeks.  She is not sure why the sugars are slightly higher  in the last 2 weeks, with occasional hyperglycemic values after meals, but overall, the majority of the blood sugars are still at goal.  I did not recommend a change in regimen for now. -At today's visit, she tells me that she felt that Trulicity was working better for her, however, the sugars are much better on Ozempic despite the fact that she is not taking the 2 mg dose, but only the 1 mg dose.  For now, I did not recommend to increase the dose due to the significant improvement in blood sugars. - I suggested to:  Patient Instructions  Please continue: - Metformin ER 500 mg 1-2x a day with meals - Toujeo 34-36 units at night - Humalog 12-20 units before the meals (higher doses with lunch), and 7 units before the snack at night  - Ozempic 2 mg weekly  Please return in 4 months.   - we checked her HbA1c: 6.8% (much improved) - advised to  check sugars at different times of the day - 4x a day, rotating check times - advised for yearly eye exams >> she is UTD - will check an ACR today - return to clinic in 4 months  2. Low B12 -Patient has a history of low B12 for which we started supplementation with p.o. vitamin B12 -We had to reduce the dose subsequently as her B12 was elevated.  She is currently taking 1000 mcg every week -Latest B12 level was normal: Lab Results  Component Value Date   VITAMINB12 1,032 01/13/2023   VITAMINB12 >1500 (H) 11/28/2021   3. HL -Latest lipid panel was reviewed from 2023: Fractions at goal: Lab Results  Component Value Date   CHOL 158 10/15/2021   HDL 72 10/15/2021   LDLCALC 62 10/15/2021   TRIG 143 10/15/2021   CHOLHDL 2.2 10/15/2021  -She continues Mevacor 40 mg daily without side effects -He is due for another lipid panel-will check today  Orders Placed This Encounter  Procedures   Lipid Panel w/reflex Direct LDL   Microalbumin / creatinine urine ratio   POCT glycosylated hemoglobin (Hb A1C)   Component     Latest Ref Rng 04/11/2023   Hemoglobin A1C     4.0 - 5.6 % 6.8 !   Cholesterol     <200 mg/dL 952   Triglycerides     <150 mg/dL 841   HDL Cholesterol     > OR = 50 mg/dL 66   Total CHOL/HDL Ratio     <5.0 (calc) 2.2   LDL Cholesterol (Calc)     mg/dL (calc) 56   Non-HDL Cholesterol (Calc)     <130 mg/dL (calc) 78   ACR not collected. Lipids are at goal.  Carlus Pavlov, MD PhD [facility] Endocrinology

## 2023-04-11 ENCOUNTER — Encounter: Payer: Self-pay | Admitting: Internal Medicine

## 2023-04-11 ENCOUNTER — Telehealth: Payer: Self-pay | Admitting: Physical Medicine and Rehabilitation

## 2023-04-11 ENCOUNTER — Ambulatory Visit (INDEPENDENT_AMBULATORY_CARE_PROVIDER_SITE_OTHER): Payer: 59 | Admitting: Internal Medicine

## 2023-04-11 VITALS — BP 122/70 | HR 81

## 2023-04-11 DIAGNOSIS — E538 Deficiency of other specified B group vitamins: Secondary | ICD-10-CM

## 2023-04-11 DIAGNOSIS — Z794 Long term (current) use of insulin: Secondary | ICD-10-CM | POA: Diagnosis not present

## 2023-04-11 DIAGNOSIS — Z7985 Long-term (current) use of injectable non-insulin antidiabetic drugs: Secondary | ICD-10-CM

## 2023-04-11 DIAGNOSIS — E1142 Type 2 diabetes mellitus with diabetic polyneuropathy: Secondary | ICD-10-CM | POA: Diagnosis not present

## 2023-04-11 DIAGNOSIS — E1165 Type 2 diabetes mellitus with hyperglycemia: Secondary | ICD-10-CM

## 2023-04-11 DIAGNOSIS — E782 Mixed hyperlipidemia: Secondary | ICD-10-CM | POA: Diagnosis not present

## 2023-04-11 DIAGNOSIS — Z7984 Long term (current) use of oral hypoglycemic drugs: Secondary | ICD-10-CM | POA: Diagnosis not present

## 2023-04-11 LAB — POCT GLYCOSYLATED HEMOGLOBIN (HGB A1C): Hemoglobin A1C: 6.8 % — AB (ref 4.0–5.6)

## 2023-04-11 NOTE — Telephone Encounter (Signed)
Patient is asking for a prescription Diclofenac gel. Pharmacy is saying over the counter is not as strong. Pharmacy- [facility] on [address]

## 2023-04-11 NOTE — Patient Instructions (Addendum)
Please continue: - Metformin ER 500 mg 1-2x a day with meals - Toujeo 34-36 units at night - Humalog 12-20 units before the meals (higher doses with lunch), and 7 units before the snack at night  - Ozempic 1 mg weekly   Please return in 4 months.

## 2023-04-12 LAB — LIPID PANEL W/REFLEX DIRECT LDL
Cholesterol: 144 mg/dL (ref <200)
HDL: 66 mg/dL (ref >=50)
LDL Cholesterol (Calc): 56 mg/dL
Non-HDL Cholesterol (Calc): 78 mg/dL (ref <130)
Total CHOL/HDL Ratio: 2.2 (calc) (ref <5.0)
Triglycerides: 137 mg/dL (ref <150)

## 2023-04-23 ENCOUNTER — Ambulatory Visit: Payer: 59 | Attending: Physical Medicine and Rehabilitation

## 2023-04-23 DIAGNOSIS — M6281 Muscle weakness (generalized): Secondary | ICD-10-CM | POA: Diagnosis not present

## 2023-04-23 DIAGNOSIS — G8929 Other chronic pain: Secondary | ICD-10-CM | POA: Diagnosis not present

## 2023-04-23 DIAGNOSIS — G8222 Paraplegia, incomplete: Secondary | ICD-10-CM | POA: Insufficient documentation

## 2023-04-23 DIAGNOSIS — R293 Abnormal posture: Secondary | ICD-10-CM | POA: Insufficient documentation

## 2023-04-23 DIAGNOSIS — M545 Low back pain, unspecified: Secondary | ICD-10-CM | POA: Diagnosis not present

## 2023-04-23 NOTE — Therapy (Signed)
 OUTPATIENT PHYSICAL THERAPY NEURO EVALUATION   Patient Name: Sayla Golonka MRN: [id number] DOB:1950/07/27, 73 y.o., female Today's Date: 04/23/2023   PCP: Enid Skeens, NP REFERRING PROVIDER: Genice Rouge, MD  END OF SESSION:  PT End of Session - 04/23/23 1149     Visit Number 1    Number of Visits 9    Date for PT Re-Evaluation 06/20/23    Authorization Type UHC dual    Progress Note Due on Visit 10    PT Start Time 1146    PT Stop Time 1225    PT Time Calculation (min) 39 min    Activity Tolerance Patient tolerated treatment well;Patient limited by pain    Behavior During Therapy [facility] for tasks assessed/performed             Past Medical History:  Diagnosis Date   Abdominal pain, chronic, left lower quadrant 08/25/2018   Elevated LFTs    Family history of heart disease 12/04/2018   Fatty liver    on [place].    Hyperlipemia    Hypertension    Inguinal hernia    2020 CT   Morbid obesity (HCC) 07/04/2015   Numbness and tingling in right hand 03/01/2021   Palpitation 03/08/2021   Personal history of noncompliance with medical treatment, presenting hazards to health 01/29/2017   Stage 3b chronic kidney disease (HCC)    Thoracic spondylosis with myelopathy    Umbilical hernia    2020 CT   Uncontrolled type 2 diabetes with neuropathy    Weakness of left leg 07/04/2015   Wheelchair bound    paraplegia of Leg - Left   Past Surgical History:  Procedure Laterality Date   APPENDECTOMY  1969   BREAST BIOPSY Right    CATARACT EXTRACTION     May 40981   LUMBAR DISC SURGERY     LUMBAR LAMINECTOMY/DECOMPRESSION MICRODISCECTOMY N/A 07/06/2015   Procedure: Thoracic ten- eleven laminectomy for spinal canal decompression;  Surgeon: Coletta Memos, MD;  Location: [facility];  Service: Orthopedics;  Laterality: N/A;   Patient Active Problem List   Diagnosis Date Noted   Chronic bilateral low back pain without sciatica 04/04/2023   Hypoglycemia 01/13/2023   Peripheral  neuropathy 01/13/2023   Encounter for annual physical exam 07/14/2022   Myofascial pain 11/26/2021   GERD without esophagitis 05/29/2021   Class 2 severe obesity due to excess calories with serious comorbidity and body mass index (BMI) of 36.0 to 36.9 in adult [facility]) 05/29/2021   Type 2 diabetes mellitus with diabetic nephropathy (HCC) 04/16/2021   Wrist pain, chronic, right 03/08/2021   Extensor carpi ulnaris tendinitis 03/01/2021   History of sleep apnea 12/04/2020   Hyperlipidemia associated with type 2 diabetes mellitus (HCC) 03/02/2020   Wheelchair dependence 08/04/2019   Thoracic spondylosis with myelopathy 08/04/2019   Bilateral carpal tunnel syndrome 07/06/2019   Atherosclerosis of aorta (HCC) 12/04/2018   Bilateral lower abdominal cramping 08/25/2018   Low serum vitamin B12 11/15/2016   CKD (chronic kidney disease) stage 3, GFR 30-59 ml/min (HCC) 10/24/2016   Fatty liver disease, nonalcoholic 10/24/2016   Estrogen deficiency 10/25/2015   Thoracic spinal stenosis 07/06/2015   Hypertension associated with type 2 diabetes mellitus (HCC) 07/04/2015   Elevated LFTs 07/04/2015   Chronic incomplete paraplegia (HCC) 07/04/2015    ONSET DATE: 04/04/23 referral  REFERRING DIAG:  G82.22 (ICD-10-CM) - Chronic incomplete paraplegia (HCC)  M54.50,G89.29 (ICD-10-CM) - Chronic bilateral low back pain without sciatica    THERAPY DIAG:  Abnormal posture -  Plan: PT plan of care cert/re-cert  Muscle weakness (generalized) - Plan: PT plan of care cert/re-cert  Rationale for Evaluation and Treatment: Rehabilitation  SUBJECTIVE:                                                                                                                                                                                             SUBJECTIVE STATEMENT: Patient arrives to clinic alone, in pwc. Reports being here for L shoulder pain. This started a few months ago when she fell between her bed and her dresser  and used her arms to pull herself back up. Also uses her arms to boost in her wc for repositioning and pressure relief. Tilts in her wc ~1x a day more for rest and edema management. Is right handed. Has been in a pwc for 8 years and current chair is ~75 years old.  Pt accompanied by: self  PERTINENT HISTORY: HLD, HTN, thoracic spondylosis with myelopathy, DMII with neuropathy  PAIN:  Are you having pain? Yes: NPRS scale: 7/10 Pain location: L shoulder Pain description: throbbing, aching Aggravating factors: using it Relieving factors: "a handful of tylenol", muscle rub Low back pain: throbbing, aching  -increased with standing  -pain rated as "sliding scale"   PRECAUTIONS: Fall and Other: incomplete paraplegia   WEIGHT BEARING RESTRICTIONS: No  FALLS: Has patient fallen in last 6 months? Yes. Number of falls 1; fell out of bed  LIVING ENVIRONMENT: Lives with: lives alone Lives in: House/apartment Stairs: No Has following equipment at home: Dan Humphreys - 2 wheeled, Wheelchair (power), Wheelchair (manual), Tour manager, and Grab bars  PLOF: Requires assistive device for independence  PATIENT GOALS: "I don't know"  OBJECTIVE:  Note: Objective measures were completed at Evaluation unless otherwise noted.  DIAGNOSTIC FINDINGS: none relevant  COGNITION: Overall cognitive status: Within functional limits for tasks assessed   SENSATION: WFL  EDEMA:  L ankle fluctuating edema ~2+  L posterior flank, per patient report, fluctuating  POSTURE: rounded shoulders, forward head, increased thoracic kyphosis, posterior pelvic tilt, flexed trunk , and weight shift right   L shoulder AROM -flexion: 72* Abduction: 74*  PATIENT SURVEYS:  Modified Oswestry 30/50  SPADI: 104/130  TREATMENT Self care/home management: -proper posture in wc, use of tilt feature  for repositioning, shoulder kinematics    PATIENT EDUCATION: Education details: PT POC, exam findings, see above Person educated: Patient Education method: Medical illustrator Education comprehension: verbalized understanding  HOME EXERCISE PROGRAM: -unsupported sit in pwc with midline posture  GOALS: Goals reviewed with patient? Yes  SHORT TERM GOALS: Target date: 05/23/23  Pt will be independent with initial HEP for improved functional strength and posture  Baseline: to be updated Goal status: INITIAL  2.  Patient will improve Oswestry score to </=25/50 to demonstrate reduction in disability related to back pain Baseline: 30/50 Goal status: INITIAL  3.  Patient will improve SPADI score to </=90/130 to demonstrate reduction in disability related to shoulder pain Baseline: 104/130 Goal status: INITIAL   LONG TERM GOALS: Target date: 06/20/23  Pt will be independent with final HEP for improved functional strength and posture  Baseline: to be updated Goal status: INITIAL  Patient will improve Oswestry score to </=20/50 to demonstrate reduction in disability related to back pain Baseline: 30/50 Goal status: INITIAL  Patient will improve SPADI score to </=80/130 to demonstrate reduction in disability related to shoulder pain Baseline: 104/130 Goal status: INITIAL  4. Patient will achieve >/= 90* shoulder flexion with no increase in pain to demonstrate improved functional ROM  Baseline: 72*  Goal status: INITIAL  5. Patient will achieve >/= 90* shoulder abduction with no increase in pain to demonstrate improved functional ROM  Baseline: 74*  Goal status: INITIAL  ASSESSMENT:  CLINICAL IMPRESSION: Patient is a 73 y.o. female who was seen today for physical therapy evaluation and treatment for chronic shoulder and low back pain. Patient relies heavily on her B UE for repositioning, transfers, pressure relief, etc due to her incomplete paraplegia and so  repetitively strains her shoulder. Her pain is also further exacerbated by her posture in her power wheelchair. Decreased core strength results in perpetual L lateral lean and thus a continued increase in her pain experience. She scored a 30/50 on the Oswestry indicating a high level of disability related to her back pain. She also scored a 104/ 130 on the SPADI indicating a high level of disability related to her shoulder pain. She would benefit from skilled PT services to address the above mentioned deficits.   OBJECTIVE IMPAIRMENTS: decreased activity tolerance, decreased endurance, decreased knowledge of condition, decreased knowledge of use of DME, decreased mobility, decreased ROM, decreased strength, hypomobility, increased fascial restrictions, impaired tone, impaired UE functional use, improper body mechanics, postural dysfunction, and pain.   ACTIVITY LIMITATIONS: carrying, lifting, standing, transfers, reach over head, hygiene/grooming, locomotion level, and caring for others  PARTICIPATION LIMITATIONS: meal prep, cleaning, laundry, interpersonal relationship, driving, shopping, and community activity  PERSONAL FACTORS: Age, Past/current experiences, Time since onset of injury/illness/exacerbation, Transportation, and 3+ comorbidities: see above  are also affecting patient's functional outcome.   REHAB POTENTIAL: Fair time since onset  CLINICAL DECISION MAKING: Stable/uncomplicated  EVALUATION COMPLEXITY: Low  PLAN:  PT FREQUENCY: 1x/week per patient request  PT DURATION: 8 weeks  PLANNED INTERVENTIONS: 97164- PT Re-evaluation, 97110-Therapeutic exercises, 97530- Therapeutic activity, 97112- Neuromuscular re-education, 97535- Self Care, 09811- Manual therapy, [phone]- Gait training, [phone]- Orthotic Fit/training, [phone]- Aquatic Therapy, Patient/Family education, Balance training, Dry Needling, Joint mobilization, Vestibular training, Visual/preceptual remediation/compensation, DME  instructions, Wheelchair mobility training, Cryotherapy, and Moist heat  PLAN FOR NEXT SESSION: HEP, posture, sitting balance/endurance   Westley Foots, PT Westley Foots, PT, DPT, CBIS  04/23/2023, 12:48 PM

## 2023-04-30 ENCOUNTER — Ambulatory Visit: Payer: 59

## 2023-04-30 DIAGNOSIS — E118 Type 2 diabetes mellitus with unspecified complications: Secondary | ICD-10-CM | POA: Diagnosis not present

## 2023-04-30 DIAGNOSIS — E1165 Type 2 diabetes mellitus with hyperglycemia: Secondary | ICD-10-CM | POA: Diagnosis not present

## 2023-04-30 DIAGNOSIS — Z794 Long term (current) use of insulin: Secondary | ICD-10-CM | POA: Diagnosis not present

## 2023-05-07 ENCOUNTER — Ambulatory Visit: Payer: 59 | Admitting: Physical Therapy

## 2023-05-08 ENCOUNTER — Ambulatory Visit: Attending: Physical Medicine and Rehabilitation

## 2023-05-08 DIAGNOSIS — R293 Abnormal posture: Secondary | ICD-10-CM | POA: Insufficient documentation

## 2023-05-08 DIAGNOSIS — M6281 Muscle weakness (generalized): Secondary | ICD-10-CM | POA: Diagnosis not present

## 2023-05-08 NOTE — Therapy (Signed)
 OUTPATIENT PHYSICAL THERAPY NEURO TREATMENT   Patient Name: Cassandra Stewart MRN: [id number] DOB:[birth year], 73 y.o., female Today's Date: 05/08/2023   PCP: Enid Skeens, NP REFERRING PROVIDER: Genice Rouge, MD  END OF SESSION:  PT End of Session - 05/08/23 1349     Visit Number 2    Number of Visits 9    Date for PT Re-Evaluation 06/20/23    Authorization Type UHC dual    Progress Note Due on Visit 10    PT Start Time 1400    PT Stop Time 1440    PT Time Calculation (min) 40 min    Activity Tolerance Patient tolerated treatment well;Patient limited by pain    Behavior During Therapy [facility] for tasks assessed/performed             Past Medical History:  Diagnosis Date   Abdominal pain, chronic, left lower quadrant 08/25/2018   Elevated LFTs    Family history of heart disease 12/04/2018   Fatty liver    on [place].    Hyperlipemia    Hypertension    Inguinal hernia    2020 CT   Morbid obesity (HCC) 07/04/2015   Numbness and tingling in right hand 03/01/2021   Palpitation 03/08/2021   Personal history of noncompliance with medical treatment, presenting hazards to health 01/29/2017   Stage 3b chronic kidney disease (HCC)    Thoracic spondylosis with myelopathy    Umbilical hernia    2020 CT   Uncontrolled type 2 diabetes with neuropathy    Weakness of left leg 07/04/2015   Wheelchair bound    paraplegia of Leg - Left   Past Surgical History:  Procedure Laterality Date   APPENDECTOMY  1969   BREAST BIOPSY Right    CATARACT EXTRACTION     May 40981   LUMBAR DISC SURGERY     LUMBAR LAMINECTOMY/DECOMPRESSION MICRODISCECTOMY N/A 07/06/2015   Procedure: Thoracic ten- eleven laminectomy for spinal canal decompression;  Surgeon: Coletta Memos, MD;  Location: [facility];  Service: Orthopedics;  Laterality: N/A;   Patient Active Problem List   Diagnosis Date Noted   Chronic bilateral low back pain without sciatica 04/04/2023   Hypoglycemia 01/13/2023   Peripheral  neuropathy 01/13/2023   Encounter for annual physical exam 07/14/2022   Myofascial pain 11/26/2021   GERD without esophagitis 05/29/2021   Class 2 severe obesity due to excess calories with serious comorbidity and body mass index (BMI) of 36.0 to 36.9 in adult [facility]) 05/29/2021   Type 2 diabetes mellitus with diabetic nephropathy (HCC) 04/16/2021   Wrist pain, chronic, right 03/08/2021   Extensor carpi ulnaris tendinitis 03/01/2021   History of sleep apnea 12/04/2020   Hyperlipidemia associated with type 2 diabetes mellitus (HCC) 03/02/2020   Wheelchair dependence 08/04/2019   Thoracic spondylosis with myelopathy 08/04/2019   Bilateral carpal tunnel syndrome 07/06/2019   Atherosclerosis of aorta (HCC) 12/04/2018   Bilateral lower abdominal cramping 08/25/2018   Low serum vitamin B12 11/15/2016   CKD (chronic kidney disease) stage 3, GFR 30-59 ml/min (HCC) 10/24/2016   Fatty liver disease, nonalcoholic 10/24/2016   Estrogen deficiency 10/25/2015   Thoracic spinal stenosis 07/06/2015   Hypertension associated with type 2 diabetes mellitus (HCC) 07/04/2015   Elevated LFTs 07/04/2015   Chronic incomplete paraplegia (HCC) 07/04/2015    ONSET DATE: 04/04/23 referral  REFERRING DIAG:  G82.22 (ICD-10-CM) - Chronic incomplete paraplegia (HCC)  M54.50,G89.29 (ICD-10-CM) - Chronic bilateral low back pain without sciatica    THERAPY DIAG:  Abnormal posture  Muscle weakness (generalized)  Rationale for Evaluation and Treatment: Rehabilitation  SUBJECTIVE:                                                                                                                                                                                             SUBJECTIVE STATEMENT: Patient reports L shoulder is feeling better. Denies falls.  Pt accompanied by: self  PERTINENT HISTORY: HLD, HTN, thoracic spondylosis with myelopathy, DMII with neuropathy  PAIN:  Are you having pain? Yes: NPRS scale:  5/10 Pain location: L shoulder Pain description: throbbing, aching Aggravating factors: using it Relieving factors: "a handful of tylenol", muscle rub Low back pain: throbbing, aching  -increased with standing  -pain rated as "sliding scale"   PRECAUTIONS: Fall and Other: incomplete paraplegia  PATIENT GOALS: "I don't know"                                                                                                                              TREATMENT Therex: -initial HEP (see below) -AAROM with dowel rod - bacward arm bike, 2 mins forward  Manual: -grade 1 mobs to L shoulder    PATIENT EDUCATION: Education details: initial HEP Person educated: Patient Education method: Medical illustrator Education comprehension: verbalized understanding  HOME EXERCISE PROGRAM: -unsupported sit in pwc with midline posture Access Code: [email or web address] URL: [email or web address] Date: 05/08/2023 Prepared by: Merry Lofty  Exercises - Scapular Retraction with Resistance  - 1 x daily - 7 x weekly - 3 sets - 10 reps - Seated Shoulder Flexion Towel Slide at Table Top  - 1 x daily - 7 x weekly - 3 sets - 10 reps - Shoulder Extension with Resistance  - 1 x daily - 7 x weekly - 3 sets - 10 reps - Standing Shoulder External Rotation with Resistance  - 1 x daily - 7 x weekly - 3 sets - 10 reps - Seated Balance Activity: Lateral Leans Elbow to Mat  - 1 x daily - 7 x weekly - 3 sets - 10 reps  GOALS: Goals reviewed with patient?  Yes  SHORT TERM GOALS: Target date: 05/23/23  Pt will be independent with initial HEP for improved functional strength and posture  Baseline: to be updated Goal status: INITIAL  2.  Patient will improve Oswestry score to </=25/50 to demonstrate reduction in disability related to back pain Baseline: 30/50 Goal status: INITIAL  3.  Patient will improve SPADI score to </=90/130 to demonstrate reduction in disability related to shoulder  pain Baseline: 104/130 Goal status: INITIAL   LONG TERM GOALS: Target date: 06/20/23  Pt will be independent with final HEP for improved functional strength and posture  Baseline: to be updated Goal status: INITIAL  Patient will improve Oswestry score to </=20/50 to demonstrate reduction in disability related to back pain Baseline: 30/50 Goal status: INITIAL  Patient will improve SPADI score to </=80/130 to demonstrate reduction in disability related to shoulder pain Baseline: 104/130 Goal status: INITIAL  4. Patient will achieve >/= 90* shoulder flexion with no increase in pain to demonstrate improved functional ROM  Baseline: 72*  Goal status: INITIAL  5. Patient will achieve >/= 90* shoulder abduction with no increase in pain to demonstrate improved functional ROM  Baseline: 74*  Goal status: INITIAL  ASSESSMENT:  CLINICAL IMPRESSION: Patient seen for skilled PT session with emphasis on establishing initial HEP. She kindly declines transferring to therapy mat this session. She is mostly limited by L shoulder pain- primarily at biceps head origin. Tolerated Grade I mobs for pain relief relatively well. Continues to benefit from encouragement for gentle passive and AAROM movement within pain tolerable ROM. Continue POC.   OBJECTIVE IMPAIRMENTS: decreased activity tolerance, decreased endurance, decreased knowledge of condition, decreased knowledge of use of DME, decreased mobility, decreased ROM, decreased strength, hypomobility, increased fascial restrictions, impaired tone, impaired UE functional use, improper body mechanics, postural dysfunction, and pain.   ACTIVITY LIMITATIONS: carrying, lifting, standing, transfers, reach over head, hygiene/grooming, locomotion level, and caring for others  PARTICIPATION LIMITATIONS: meal prep, cleaning, laundry, interpersonal relationship, driving, shopping, and community activity  PERSONAL FACTORS: Age, Past/current experiences, Time  since onset of injury/illness/exacerbation, Transportation, and 3+ comorbidities: see above  are also affecting patient's functional outcome.   REHAB POTENTIAL: Fair time since onset  CLINICAL DECISION MAKING: Stable/uncomplicated  EVALUATION COMPLEXITY: Low  PLAN:  PT FREQUENCY: 1x/week per patient request  PT DURATION: 8 weeks  PLANNED INTERVENTIONS: 97164- PT Re-evaluation, 97110-Therapeutic exercises, 97530- Therapeutic activity, 97112- Neuromuscular re-education, 97535- Self Care, 09811- Manual therapy, [phone]- Gait training, [phone]- Orthotic Fit/training, [phone]- Aquatic Therapy, Patient/Family education, Balance training, Dry Needling, Joint mobilization, Vestibular training, Visual/preceptual remediation/compensation, DME instructions, Wheelchair mobility training, Cryotherapy, and Moist heat  PLAN FOR NEXT SESSION: HEP, posture, sitting balance/endurance, how did she feel after last session?   Westley Foots, PT Westley Foots, PT, DPT, CBIS  05/08/2023, 2:54 PM

## 2023-05-14 ENCOUNTER — Ambulatory Visit: Payer: 59 | Admitting: Physical Therapy

## 2023-05-21 ENCOUNTER — Ambulatory Visit: Payer: 59

## 2023-05-21 ENCOUNTER — Ambulatory Visit: Payer: 59 | Admitting: Physical Medicine and Rehabilitation

## 2023-05-27 NOTE — Therapy (Signed)
 [facility] [address] [place], [place], [address] Phone: [phone]   Fax:  [phone]  Patient Details  Name: Cassandra Stewart MRN: [id number] Date of Birth: [birth year] Referring Provider:  No ref. provider found  Encounter Date: 05/27/2023 PHYSICAL THERAPY DISCHARGE SUMMARY  Visits from Start of Care: 2  Current functional level related to goals / functional outcomes: Unable to be assessed as patient did not return since last visit   Remaining deficits: Unable to be assessed as patient did not return since last visit   Education / Equipment: PT POC, initial HEP   Patient agrees to discharge. Patient goals were not met. Patient is being discharged due to the patient's request.   Westley Foots, PT Westley Foots, PT, DPT, CBIS  05/27/2023, 12:54 PM  [facility] [address] [place], [place], [address] Phone: [phone]   Fax:  [phone]

## 2023-05-28 ENCOUNTER — Ambulatory Visit: Payer: 59

## 2023-05-30 DIAGNOSIS — E1165 Type 2 diabetes mellitus with hyperglycemia: Secondary | ICD-10-CM | POA: Diagnosis not present

## 2023-05-30 DIAGNOSIS — E118 Type 2 diabetes mellitus with unspecified complications: Secondary | ICD-10-CM | POA: Diagnosis not present

## 2023-05-30 DIAGNOSIS — Z794 Long term (current) use of insulin: Secondary | ICD-10-CM | POA: Diagnosis not present

## 2023-06-03 ENCOUNTER — Other Ambulatory Visit (HOSPITAL_COMMUNITY): Payer: Self-pay

## 2023-06-04 ENCOUNTER — Ambulatory Visit: Payer: 59 | Admitting: Physical Therapy

## 2023-06-11 ENCOUNTER — Ambulatory Visit: Payer: 59

## 2023-06-18 ENCOUNTER — Ambulatory Visit: Payer: 59

## 2023-06-25 ENCOUNTER — Other Ambulatory Visit (HOSPITAL_COMMUNITY): Payer: Self-pay

## 2023-06-25 NOTE — Progress Notes (Signed)
 [facility] - Clinic Note  07/09/2023     CHIEF COMPLAINT Patient presents for Retina Follow Up   HISTORY OF PRESENT ILLNESS: Cassandra Stewart is a 73 y.o. female who presents to the clinic today for:   HPI     Retina Follow Up   In both eyes.  This started 6 years ago.  Duration of 1 year.  Since onset it is stable.  I, the attending physician,  performed the HPI with the patient and updated documentation appropriately.        Comments   Pt presents for 1 year retina follow up, NPDR OU. Pt states vision in her left eye seems more blurry both NVA and DVA, vision was more crisp after cataract surgery and not it seems like there is another film in her eye. Pt denies any FOL. Pt has some dull pain in the left eye every once in a while. Pt uses ats BID but still wakes up with matted eyes. A1c 6.8 04/11/2023. BS was 69 a little while ago. Pt started Ozempic  5 months now and sugars have been coming down.      Last edited by Ronelle Coffee, MD on 07/13/2023  5:41 PM.    Pt states vision is stable  Referring physician: Annella Kief, NP [address] [place],  [place] [address]  HISTORICAL INFORMATION:   Selected notes from the MEDICAL RECORD NUMBER Referred by V. Maree Shames, NP-C for DM exam LEE-  Ocular Hx- pseudophakia OU (2016, Dr. Zelda Hickman at [facility]) PMH- DM2 on insulin , HTN,     CURRENT MEDICATIONS: No current outpatient medications on file. (Ophthalmic Drugs)   No current facility-administered medications for this visit. (Ophthalmic Drugs)   Current Outpatient Medications (Other)  Medication Sig   ACCU-CHEK GUIDE test strip USE AS DIRECTED TO TEST BLOOD SUGAR FOUR TIMES DAILY (Patient not taking: Reported on 07/11/2023)   acetaminophen  (TYLENOL ) 650 MG CR tablet Take 650 mg by mouth every 8 (eight) hours as needed for pain.   acetaminophen -codeine  (TYLENOL  #3) 300-30 MG tablet Take 1 tablet by mouth every 8 (eight) hours as needed for  moderate pain (pain score 4-6). (Patient not taking: Reported on 07/11/2023)   amLODipine  (NORVASC ) 10 MG tablet Take 1 tablet (10 mg total) by mouth daily.   aspirin  EC 81 MG tablet Take 81 mg by mouth every evening.   Cholecalciferol (VITAMIN D  PO) Take 1,000 mg by mouth daily.    Continuous Blood Gluc Receiver (DEXCOM G6 RECEIVER) DEVI by Does not apply route.   Cyanocobalamin  (VITAMIN B 12 PO) Take 5,000 mg by mouth daily.   diclofenac  Sodium (VOLTAREN ) 1 % GEL Apply 4 g topically 4 (four) times daily.   dicyclomine  (BENTYL ) 10 MG capsule Take 1 capsule (10 mg total) by mouth 3 (three) times daily before meals.   gabapentin  (NEURONTIN ) 300 MG capsule Take 1 capsule (300 mg total) by mouth 3 (three) times daily.   Glucagon  (GVOKE HYPOPEN  2-PACK) 1 MG/0.2ML SOAJ Use for emergency low blood sugar treatment.   hydrochlorothiazide  (MICROZIDE ) 12.5 MG capsule Take 1 capsule (12.5 mg total) by mouth daily.   insulin  lispro (HUMALOG ) 100 UNIT/ML KwikPen INJECT 12-20 UNITS UNDER THE SKIN 2-3 TIMES DAILY   Insulin  Pen Needle (CAREFINE PEN NEEDLES) 32G X 4 MM MISC Use 4x a day   lovastatin  (MEVACOR ) 40 MG tablet Take 1 tablet (40 mg total) by mouth at bedtime.   metFORMIN  (GLUCOPHAGE -XR) 500 MG 24 hr tablet TAKE  1 TABLET(500 MG) BY MOUTH TWICE DAILY   nitroGLYCERIN  (NITROSTAT ) 0.4 MG SL tablet Place 1 tablet (0.4 mg total) under the tongue every 5 (five) minutes as needed for chest pain. (Patient not taking: Reported on 07/11/2023)   polyethylene glycol (MIRALAX  / GLYCOLAX ) 17 g packet Take 17 g by mouth as needed. (Patient not taking: Reported on 07/11/2023)   Semaglutide , 1 MG/DOSE, (OZEMPIC , 1 MG/DOSE,) 4 MG/3ML SOPN Inject 1 mg into the skin once a week.   TOUJEO  SOLOSTAR 300 UNIT/ML Solostar Pen Inject 34-36 Units into the skin at bedtime.   nystatin  (MYCOSTATIN /NYSTOP ) powder Apply 1 Application topically 3 (three) times daily.   No current facility-administered medications for this visit. (Other)    REVIEW OF SYSTEMS: ROS   Positive for: Musculoskeletal, Endocrine, Eyes Negative for: Constitutional, Gastrointestinal, Neurological, Skin, Genitourinary, HENT, Cardiovascular, Respiratory, Psychiatric, Allergic/Imm, Heme/Lymph Last edited by Carrington Clack, COT on 07/09/2023 12:34 PM.     ALLERGIES Allergies  Allergen Reactions   Tramadol  Nausea And Vomiting    Severe N/V- with Tramadol    Other     PT PREFERS TO NOT HAVE ANY NARCOTIC MEDICATIONS   Chlorhexidine Rash    Burning after using CHG wipes-used for surgery   Oxycodone  Hcl     Other reaction(s): Hallucination Marked hallucinations and palpitations following dose of 10mg  on 04/30/2015    PAST MEDICAL HISTORY Past Medical History:  Diagnosis Date   Abdominal pain, chronic, left lower quadrant 08/25/2018   Elevated LFTs    Family history of heart disease 12/04/2018   Fatty liver    on US .    Hyperlipemia    Hypertension    Inguinal hernia    2020 CT   Morbid obesity (HCC) 07/04/2015   Numbness and tingling in right hand 03/01/2021   Palpitation 03/08/2021   Personal history of noncompliance with medical treatment, presenting hazards to health 01/29/2017   Stage 3b chronic kidney disease (HCC)    Thoracic spondylosis with myelopathy    Umbilical hernia    2020 CT   Uncontrolled type 2 diabetes with neuropathy    Weakness of left leg 07/04/2015   Wheelchair bound    paraplegia of Leg - Left   Past Surgical History:  Procedure Laterality Date   APPENDECTOMY  1969   BREAST BIOPSY Right    CATARACT EXTRACTION     May [address]52   LUMBAR DISC SURGERY     LUMBAR LAMINECTOMY/DECOMPRESSION MICRODISCECTOMY N/A 07/06/2015   Procedure: Thoracic ten- eleven laminectomy for spinal canal decompression;  Surgeon: Audie Bleacher, MD;  Location: [facility];  Service: Orthopedics;  Laterality: N/A;   FAMILY HISTORY Family History  Problem Relation Age of Onset   Diabetes Mother    Heart disease Mother    Pancreatic cancer  Sister    Kidney disease Brother        Two brothers on ESRD   Amblyopia Neg Hx    Blindness Neg Hx    Cataracts Neg Hx    Glaucoma Neg Hx    Macular degeneration Neg Hx    Retinal detachment Neg Hx    Strabismus Neg Hx    Retinitis pigmentosa Neg Hx    Colon cancer Neg Hx    Esophageal cancer Neg Hx    SOCIAL HISTORY Social History   Tobacco Use   Smoking status: Never   Smokeless tobacco: Never  Vaping Use   Vaping status: Never Used  Substance Use Topics   Alcohol use: No  Drug use: No       OPHTHALMIC EXAM:  Base Eye Exam     Visual Acuity (Snellen - Linear)       Right Left   Dist [place] 20/20 -1 20/20         Tonometry (Tonopen, 12:46 PM)       Right Left   Pressure 16 15         Pupils       Dark Light Shape React APD   Right 3 2 Round Brisk None   Left 3 2 Round Brisk None         Visual Fields       Left Right    Full Full         Extraocular Movement       Right Left    Full, Ortho Full, Ortho         Neuro/Psych     Oriented x3: Yes   Mood/Affect: Normal         Dilation     Both eyes: 1.0% Mydriacyl, 2.5% Phenylephrine  @ 12:48 PM           Slit Lamp and Fundus Exam     External Exam       Right Left   External Normal Normal         Slit Lamp Exam       Right Left   Lids/Lashes Dermatochalasis - upper lid Dermatochalasis - upper lid   Conjunctiva/Sclera mild melanosis Mild Melanosis   Cornea mild arcus, well healed cataract wound, trace tear film debris, 1+ fine Punctate epithelial erosions arcus, 1-2+ Punctate epithelial erosions, well healed cataract wound, tear film debris   Anterior Chamber deep and clear Deep and quiet   Iris Round and well dilated, No NVI Round and dilated, No NVI   Lens PC IOL in good postion, trace inferior Posterior capsular opacification PC IOL in good position with open PC   Anterior Vitreous Vitreous syneresis, Posterior vitreous detachment Vitreous syneresis, Posterior  vitreous detachment         Fundus Exam       Right Left   Disc Pink and Sharp Mildly Tilted disc, Pink and Sharp   C/D Ratio 0.65 0.6   Macula Flat, Good foveal reflex, mild RPE mottling, rare MA / DBH inferior mac; no frank edema Flat, Good foveal reflex, mild RPE mottling, rare MA, +cystic changes / edema temporal fovea and macula   Vessels attenuated, Tortuous attenuated, mild tortuosity   Periphery Attached; scattered MA/DBH mostly posterior Attached; rare scattered MA/DBH mostly posterior           IMAGING AND PROCEDURES  Imaging and Procedures for 06/09/17  OCT, Retina - OU - Both Eyes        Right Eye Quality was good. Central Foveal Thickness: 261. Progression has been stable. Findings include normal foveal contour, no SRF, myopic contour, intraretinal hyper-reflective material, intraretinal fluid (Tr cystic changes / IRHM inferior macula).   Left Eye Quality was good. Central Foveal Thickness: 342. Progression has worsened. Findings include no SRF, abnormal foveal contour, myopic contour, intraretinal hyper-reflective material, intraretinal fluid (Interval increase in IRF temporal fovea).   Notes  *Images captured and stored on drive  Diagnosis / Impression:  NFP, No IRF/SRF OU OD: Tr cystic changes / IRHM inferior macula OS: +DME -- interval increase in IRF temporal fovea  Clinical management:  See below  Abbreviations: NFP - Normal foveal profile. CME - cystoid macular  edema. PED - pigment epithelial detachment. IRF - intraretinal fluid. SRF - subretinal fluid. EZ - ellipsoid zone. ERM - epiretinal membrane. ORA - outer retinal atrophy. ORT - outer retinal tubulation. SRHM - subretinal hyper-reflective material              ASSESSMENT/PLAN:    ICD-10-CM   1. Both eyes affected by mild nonproliferative diabetic retinopathy with macular edema, associated with type 2 diabetes mellitus (HCC)  E11.3213 OCT, Retina - OU - Both Eyes    2. Long term  (current) use of oral hypoglycemic drugs  Z79.84     3. Current use of insulin  (HCC)  Z79.4     4. 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 Essential hypertension  I10     6. Hypertensive retinopathy of both eyes  H35.033     7. Posterior vitreous detachment of both eyes  H43.813     8. Pseudophakia of both eyes  Z96.1     9. PCO (posterior capsular opacification), left  H26.492      1-4. DM2 w/ mild non-proliferative diabetic retinopathy, OU  - last A1c 6.8 on 02.14.25  - exam with scattered MAs and improved CWSs OU  - OCT shows OD: Tr cystic changes / IRHM inferior macula; OS: +DME -- interval increase in IRF temporal fovea  - BCVA improved to 20/20 OU from 20/25 OU  - f/u 3 months -- repeat DFE, OCT  5,6. Hypertensive retinopathy OU - mild  - discussed importance of tight BP control  - monitor   7. PVD / vitreous syneresis OU  - asymptomatic  - Discussed findings and prognosis  - No RT or RD on 360 peripheral exam  - Reviewed s/s of RT/RD  - strict return precautions for any such RT/RD symptoms  8. Pseudophakia OU  - s/p CE/IOL in 2016, [facility], Dr. Zelda Hickman  - beautiful surgeries, doing well  - monitor   9. PCO OU  - s/p YAG OS (07.14.21)  - good opening, BCVA improved to 20/20 OS  - monitor    Ophthalmic Meds Ordered this visit:  No orders of the defined types were placed in this encounter.    Return in about 3 months (around 10/09/2023) for f/u NPDR OU, DFE, OCT.  There are no Patient Instructions on file for this visit.   Explained the diagnoses, plan, and follow up with the patient and they expressed understanding.  Patient expressed understanding of the importance of proper follow up care.   This document serves as a record of services personally performed by Jeanice Millard, MD, PhD. It was created on their behalf by Angelia Kelp, an ophthalmic technician. The creation of this record is the provider's dictation and/or  activities during the visit.    Electronically signed by: Angelia Kelp, OA, 07/13/23  11:01 PM  This document serves as a record of services personally performed by Jeanice Millard, MD, PhD. It was created on their behalf by Morley Arabia. Bevin Bucks, OA an ophthalmic technician. The creation of this record is the provider's dictation and/or activities during the visit.    Electronically signed by: Morley Arabia. Bevin Bucks, OA 07/13/23 11:01 PM  Jeanice Millard, M.D., Ph.D. Diseases & Surgery of the Retina and Vitreous [facility]  I have reviewed the above documentation for accuracy and completeness, and I agree with the above. Jeanice Millard, M.D., Ph.D. 07/13/23 11:09 PM  Abbreviations: M myopia (nearsighted); A astigmatism; H hyperopia (farsighted); P presbyopia; Mrx spectacle prescription;  CTL contact lenses; OD right eye; OS left eye; OU both eyes  XT exotropia; ET esotropia; PEK punctate epithelial keratitis; PEE punctate epithelial erosions; DES dry eye syndrome; MGD meibomian gland dysfunction; ATs artificial tears; PFAT's preservative free artificial tears; NSC nuclear sclerotic cataract; PSC posterior subcapsular cataract; ERM epi-retinal membrane; PVD posterior vitreous detachment; RD retinal detachment; DM diabetes mellitus; DR diabetic retinopathy; NPDR non-proliferative diabetic retinopathy; PDR proliferative diabetic retinopathy; CSME clinically significant macular edema; DME diabetic macular edema; dbh dot blot hemorrhages; CWS cotton wool spot; POAG primary open angle glaucoma; C/D cup-to-disc ratio; HVF humphrey visual field; GVF goldmann visual field; OCT optical coherence tomography; IOP intraocular pressure; BRVO Branch retinal vein occlusion; CRVO central retinal vein occlusion; CRAO central retinal artery occlusion; BRAO branch retinal artery occlusion; RT retinal tear; SB scleral buckle; PPV pars plana vitrectomy; VH Vitreous hemorrhage; PRP panretinal laser  photocoagulation; IVK intravitreal kenalog; VMT vitreomacular traction; MH Macular hole;  NVD neovascularization of the disc; NVE neovascularization elsewhere; AREDS age related eye disease study; ARMD age related macular degeneration; POAG primary open angle glaucoma; EBMD epithelial/anterior basement membrane dystrophy; ACIOL anterior chamber intraocular lens; IOL intraocular lens; PCIOL posterior chamber intraocular lens; Phaco/IOL phacoemulsification with intraocular lens placement; PRK photorefractive keratectomy; LASIK laser assisted in situ keratomileusis; HTN hypertension; DM diabetes mellitus; COPD chronic obstructive pulmonary disease

## 2023-06-29 DIAGNOSIS — E1165 Type 2 diabetes mellitus with hyperglycemia: Secondary | ICD-10-CM | POA: Diagnosis not present

## 2023-06-29 DIAGNOSIS — E118 Type 2 diabetes mellitus with unspecified complications: Secondary | ICD-10-CM | POA: Diagnosis not present

## 2023-06-29 DIAGNOSIS — Z794 Long term (current) use of insulin: Secondary | ICD-10-CM | POA: Diagnosis not present

## 2023-07-04 ENCOUNTER — Encounter: Payer: 59 | Attending: Physical Medicine and Rehabilitation | Admitting: Physical Medicine and Rehabilitation

## 2023-07-04 DIAGNOSIS — G8222 Paraplegia, incomplete: Secondary | ICD-10-CM | POA: Insufficient documentation

## 2023-07-04 DIAGNOSIS — M25512 Pain in left shoulder: Secondary | ICD-10-CM | POA: Insufficient documentation

## 2023-07-04 DIAGNOSIS — Z993 Dependence on wheelchair: Secondary | ICD-10-CM | POA: Insufficient documentation

## 2023-07-04 DIAGNOSIS — M545 Low back pain, unspecified: Secondary | ICD-10-CM | POA: Insufficient documentation

## 2023-07-04 DIAGNOSIS — G8929 Other chronic pain: Secondary | ICD-10-CM | POA: Insufficient documentation

## 2023-07-09 ENCOUNTER — Ambulatory Visit (INDEPENDENT_AMBULATORY_CARE_PROVIDER_SITE_OTHER): Payer: 59 | Admitting: Ophthalmology

## 2023-07-09 ENCOUNTER — Encounter (INDEPENDENT_AMBULATORY_CARE_PROVIDER_SITE_OTHER): Payer: Self-pay | Admitting: Ophthalmology

## 2023-07-09 DIAGNOSIS — H35033 Hypertensive retinopathy, bilateral: Secondary | ICD-10-CM

## 2023-07-09 DIAGNOSIS — E113293 Type 2 diabetes mellitus with mild nonproliferative diabetic retinopathy without macular edema, bilateral: Secondary | ICD-10-CM

## 2023-07-09 DIAGNOSIS — Z7984 Long term (current) use of oral hypoglycemic drugs: Secondary | ICD-10-CM

## 2023-07-09 DIAGNOSIS — H35049 Retinal micro-aneurysms, unspecified, unspecified eye: Secondary | ICD-10-CM

## 2023-07-09 DIAGNOSIS — Z794 Long term (current) use of insulin: Secondary | ICD-10-CM | POA: Diagnosis not present

## 2023-07-09 DIAGNOSIS — E113213 Type 2 diabetes mellitus with mild nonproliferative diabetic retinopathy with macular edema, bilateral: Secondary | ICD-10-CM

## 2023-07-09 DIAGNOSIS — Z961 Presence of intraocular lens: Secondary | ICD-10-CM

## 2023-07-09 DIAGNOSIS — H43813 Vitreous degeneration, bilateral: Secondary | ICD-10-CM

## 2023-07-09 DIAGNOSIS — I10 Essential (primary) hypertension: Secondary | ICD-10-CM

## 2023-07-09 DIAGNOSIS — H26492 Other secondary cataract, left eye: Secondary | ICD-10-CM

## 2023-07-09 DIAGNOSIS — E11319 Type 2 diabetes mellitus with unspecified diabetic retinopathy without macular edema: Secondary | ICD-10-CM

## 2023-07-09 LAB — HM DIABETES EYE EXAM

## 2023-07-11 ENCOUNTER — Other Ambulatory Visit: Payer: Self-pay

## 2023-07-11 ENCOUNTER — Encounter: Payer: Self-pay | Admitting: Medical

## 2023-07-11 ENCOUNTER — Ambulatory Visit: Admitting: Medical

## 2023-07-11 ENCOUNTER — Other Ambulatory Visit (HOSPITAL_COMMUNITY): Payer: Self-pay

## 2023-07-11 VITALS — BP 110/50 | HR 67

## 2023-07-11 DIAGNOSIS — L98491 Non-pressure chronic ulcer of skin of other sites limited to breakdown of skin: Secondary | ICD-10-CM

## 2023-07-11 DIAGNOSIS — E162 Hypoglycemia, unspecified: Secondary | ICD-10-CM

## 2023-07-11 DIAGNOSIS — Z794 Long term (current) use of insulin: Secondary | ICD-10-CM | POA: Diagnosis not present

## 2023-07-11 DIAGNOSIS — E1121 Type 2 diabetes mellitus with diabetic nephropathy: Secondary | ICD-10-CM | POA: Diagnosis not present

## 2023-07-11 MED ORDER — NYSTATIN 100000 UNIT/GM EX POWD
1.0000 | Freq: Three times a day (TID) | CUTANEOUS | 0 refills | Status: DC
  Filled 2023-07-11 (×2): qty 30, 10d supply, fill #0

## 2023-07-11 NOTE — Patient Instructions (Signed)
 Stasis ulcer At this point lets discontinue Neosporin or ointment The area needs to be dry and avoid moisture and sweat You may need to use towels and blot to dry the area more than once a day Certainly continue good hygiene with soap and water Begin nystatin powder 3 times a day as there is likely a fungal component given the moisture If you are able to lying naked on the bed on your side or belly that may be helpful some too I will put in the order for home health nurse to come out and check on the ulcer for the next few weeks   Diabetes with recent hypoglycemia We want to avoid low blood sugar readings less than 70 If you are currently at 34 units of Toujeo  back down to 29 units daily for the next week or 2 and monitor your blood sugars. Continue other medicines as usual If you continue to get low blood sugar readings around 70 or less then back down on the Toujeo  at least 2 or 3 more units and let us  know   Recheck in office 2-3 weeks

## 2023-07-11 NOTE — Progress Notes (Signed)
 Subjective:  Cassandra Stewart is a 73 y.o. female who presents for Chief Complaint  Patient presents with   Establish Care    Pain in bottom. Thinks it could be from skin breaking down. Pain it at the top of the intergluteal cleft. Has been applying neosporin, with not much improvement.  Pain is at a 7 at the current moment.   Wheelchair concerns.     Here alone.  Lives alone.  She uses a motorized wheelchair/power chair.   She is able to stand for brief periods.  She has a shower chair at home and takes care of her on bathing, toileting and ADLs  She notes for the last several months possibly even the last year she has had an area at the top of her buttocks has been giving her problems.  She is continue to use Neosporin daily for months.  Lately it seems to be little worse.  She changed her pillow in her power chair and changed her mattress at home to see if that would help as it seemed to be irritating the area but that has not made any difference.  No fever, no chills.  No drainage from the lesion.  She is compliant with her medicines.  She does note some hypoglycemic readings recently  No other aggravating or relieving factors.    No other c/o.  Past Medical History:  Diagnosis Date   Abdominal pain, chronic, left lower quadrant 08/25/2018   Elevated LFTs    Family history of heart disease 12/04/2018   Fatty liver    on US .    Hyperlipemia    Hypertension    Inguinal hernia    2020 CT   Morbid obesity (HCC) 07/04/2015   Numbness and tingling in right hand 03/01/2021   Palpitation 03/08/2021   Personal history of noncompliance with medical treatment, presenting hazards to health 01/29/2017   Stage 3b chronic kidney disease (HCC)    Thoracic spondylosis with myelopathy    Umbilical hernia    2020 CT   Uncontrolled type 2 diabetes with neuropathy    Weakness of left leg 07/04/2015   Wheelchair bound    paraplegia of Leg - Left   Current Outpatient Medications on File  Prior to Visit  Medication Sig Dispense Refill   acetaminophen  (TYLENOL ) 650 MG CR tablet Take 650 mg by mouth every 8 (eight) hours as needed for pain.     amLODipine  (NORVASC ) 10 MG tablet Take 1 tablet (10 mg total) by mouth daily. 90 tablet 3   aspirin  EC 81 MG tablet Take 81 mg by mouth every evening.     Cholecalciferol (VITAMIN D  PO) Take 1,000 mg by mouth daily.      Continuous Blood Gluc Receiver (DEXCOM G6 RECEIVER) DEVI by Does not apply route.     Cyanocobalamin  (VITAMIN B 12 PO) Take 5,000 mg by mouth daily.     diclofenac  Sodium (VOLTAREN ) 1 % GEL Apply 4 g topically 4 (four) times daily. 100 g 0   dicyclomine  (BENTYL ) 10 MG capsule Take 1 capsule (10 mg total) by mouth 3 (three) times daily before meals. 270 capsule 3   gabapentin  (NEURONTIN ) 300 MG capsule Take 1 capsule (300 mg total) by mouth 3 (three) times daily. 270 capsule 3   Glucagon  (GVOKE HYPOPEN  2-PACK) 1 MG/0.2ML SOAJ Use for emergency low blood sugar treatment. 0.4 mL 3   hydrochlorothiazide  (MICROZIDE ) 12.5 MG capsule Take 1 capsule (12.5 mg total) by mouth daily. 90 capsule 1  insulin  lispro (HUMALOG ) 100 UNIT/ML KwikPen INJECT 12-20 UNITS UNDER THE SKIN 2-3 TIMES DAILY 45 mL 3   Insulin  Pen Needle (CAREFINE PEN NEEDLES) 32G X 4 MM MISC Use 4x a day 300 each 3   lovastatin  (MEVACOR ) 40 MG tablet Take 1 tablet (40 mg total) by mouth at bedtime. 90 tablet 1   metFORMIN  (GLUCOPHAGE -XR) 500 MG 24 hr tablet TAKE 1 TABLET(500 MG) BY MOUTH TWICE DAILY 180 tablet 1   Semaglutide , 1 MG/DOSE, (OZEMPIC , 1 MG/DOSE,) 4 MG/3ML SOPN Inject 1 mg into the skin once a week. 9 mL 3   TOUJEO  SOLOSTAR 300 UNIT/ML Solostar Pen Inject 34-36 Units into the skin at bedtime. 12 mL 3   ACCU-CHEK GUIDE test strip USE AS DIRECTED TO TEST BLOOD SUGAR FOUR TIMES DAILY (Patient not taking: Reported on 07/11/2023) 400 strip 1   acetaminophen -codeine  (TYLENOL  #3) 300-30 MG tablet Take 1 tablet by mouth every 8 (eight) hours as needed for moderate  pain (pain score 4-6). (Patient not taking: Reported on 07/11/2023) 21 tablet 0   nitroGLYCERIN  (NITROSTAT ) 0.4 MG SL tablet Place 1 tablet (0.4 mg total) under the tongue every 5 (five) minutes as needed for chest pain. (Patient not taking: Reported on 07/11/2023) 25 tablet 3   polyethylene glycol (MIRALAX  / GLYCOLAX ) 17 g packet Take 17 g by mouth as needed. (Patient not taking: Reported on 07/11/2023)     No current facility-administered medications on file prior to visit.     The following portions of the patient's history were reviewed and updated as appropriate: allergies, current medications, past family history, past medical history, past social history, past surgical history and problem list.  ROS Otherwise as in subjective above    Objective: BP (!) 110/50   Pulse 67   SpO2 97%   Wt Readings from Last 3 Encounters:  04/04/23 218 lb (98.9 kg)  04/04/22 218 lb (98.9 kg)  12/07/21 212 lb (96.2 kg)    General appearance: alert, no distress, well developed, well nourished, seated in power chair In the middle of the gluteal cleft is a somewhat of a star-shaped ulcer that is only slightly deep approximately 1 mm in depth.  The actual ulcerated area is only about a centimeter diameter but there is some macerated skin surrounding this area.  The entire gluteal cleft is discolored suggesting some moisture and stasis.  There is no obvious cyst at the top of the gluteal cleft.  No obvious pus induration or fluctuance. Exam chaperoned by nurse    Assessment: Encounter Diagnoses  Name Primary?   Skin ulcer, limited to breakdown of skin (HCC) Yes   Type 2 diabetes mellitus with diabetic nephropathy, with long-term current use of insulin  (HCC)    Hypoglycemia      Plan: Stasis ulcer At this point lets discontinue Neosporin or ointment The area needs to be dry and avoid moisture and sweat You may need to use towels and blot to dry the area more than once a day Certainly continue  good hygiene with soap and water Begin nystatin powder 3 times a day as there is likely a fungal component given the moisture If you are able to lying naked on the bed on your side or belly that may be helpful some too I will put in the order for home health nurse to come out and check on the ulcer for the next few weeks   Diabetes with recent hypoglycemia We want to avoid low blood sugar readings less than  70 If you are currently at 34 units of Toujeo  back down to 29 units daily for the next week or 2 and monitor your blood sugars. Continue other medicines as usual If you continue to get low blood sugar readings around 70 or less then back down on the Toujeo  at least 2 or 3 more units and let us  know   Recheck in office 2-3 weeks  Chiaki was seen today for establish care.  Diagnoses and all orders for this visit:  Skin ulcer, limited to breakdown of skin (HCC)  Type 2 diabetes mellitus with diabetic nephropathy, with long-term current use of insulin  (HCC)  Hypoglycemia  Other orders -     nystatin (MYCOSTATIN/NYSTOP) powder; Apply 1 Application topically 3 (three) times daily.    Follow up: 10mo

## 2023-07-11 NOTE — Progress Notes (Signed)
 referred

## 2023-07-11 NOTE — Addendum Note (Signed)
 Addended by: Charliene Conte A on: 07/11/2023 11:35 AM   Modules accepted: Orders

## 2023-07-11 NOTE — Progress Notes (Signed)
 Subjective:  Cassandra Stewart is a 73 y.o. female who presents for Chief Complaint  Patient presents with   Establish Care    Pain in bottom. Thinks it could be from skin breaking down. Pain it at the top of the intergluteal cleft. Has been applying neosporin, with not much improvement.  Pain is at a 7 at the current moment.   Wheelchair concerns.     Here alone.  Lives alone.  She uses a motorized wheelchair/power chair.   She is able to stand for brief periods.  She has a shower chair at home and takes care of her on bathing, toileting and ADLs  She notes for the last several months possibly even the last year she has had an area at the top of her buttocks has been giving her problems.  She is continue to use Neosporin daily for months.  Lately it seems to be little worse.  She changed her pillow in her power chair and changed her mattress at home to see if that would help as it seemed to be irritating the area but that has not made any difference.  No fever, no chills.  No drainage from the lesion.  She is compliant with her medicines.  She does note some hypoglycemic readings recently  No other aggravating or relieving factors.    No other c/o.  Past Medical History:  Diagnosis Date   Abdominal pain, chronic, left lower quadrant 08/25/2018   Elevated LFTs    Family history of heart disease 12/04/2018   Fatty liver    on US .    Hyperlipemia    Hypertension    Inguinal hernia    2020 CT   Morbid obesity (HCC) 07/04/2015   Numbness and tingling in right hand 03/01/2021   Palpitation 03/08/2021   Personal history of noncompliance with medical treatment, presenting hazards to health 01/29/2017   Stage 3b chronic kidney disease (HCC)    Thoracic spondylosis with myelopathy    Umbilical hernia    2020 CT   Uncontrolled type 2 diabetes with neuropathy    Weakness of left leg 07/04/2015   Wheelchair bound    paraplegia of Leg - Left   Current Outpatient Medications on File  Prior to Visit  Medication Sig Dispense Refill   acetaminophen  (TYLENOL ) 650 MG CR tablet Take 650 mg by mouth every 8 (eight) hours as needed for pain.     amLODipine  (NORVASC ) 10 MG tablet Take 1 tablet (10 mg total) by mouth daily. 90 tablet 3   aspirin  EC 81 MG tablet Take 81 mg by mouth every evening.     Cholecalciferol (VITAMIN D  PO) Take 1,000 mg by mouth daily.      Continuous Blood Gluc Receiver (DEXCOM G6 RECEIVER) DEVI by Does not apply route.     Cyanocobalamin  (VITAMIN B 12 PO) Take 5,000 mg by mouth daily.     diclofenac  Sodium (VOLTAREN ) 1 % GEL Apply 4 g topically 4 (four) times daily. 100 g 0   dicyclomine  (BENTYL ) 10 MG capsule Take 1 capsule (10 mg total) by mouth 3 (three) times daily before meals. 270 capsule 3   gabapentin  (NEURONTIN ) 300 MG capsule Take 1 capsule (300 mg total) by mouth 3 (three) times daily. 270 capsule 3   Glucagon  (GVOKE HYPOPEN  2-PACK) 1 MG/0.2ML SOAJ Use for emergency low blood sugar treatment. 0.4 mL 3   hydrochlorothiazide  (MICROZIDE ) 12.5 MG capsule Take 1 capsule (12.5 mg total) by mouth daily. 90 capsule 1  insulin  lispro (HUMALOG ) 100 UNIT/ML KwikPen INJECT 12-20 UNITS UNDER THE SKIN 2-3 TIMES DAILY 45 mL 3   Insulin  Pen Needle (CAREFINE PEN NEEDLES) 32G X 4 MM MISC Use 4x a day 300 each 3   lovastatin  (MEVACOR ) 40 MG tablet Take 1 tablet (40 mg total) by mouth at bedtime. 90 tablet 1   metFORMIN  (GLUCOPHAGE -XR) 500 MG 24 hr tablet TAKE 1 TABLET(500 MG) BY MOUTH TWICE DAILY 180 tablet 1   Semaglutide , 1 MG/DOSE, (OZEMPIC , 1 MG/DOSE,) 4 MG/3ML SOPN Inject 1 mg into the skin once a week. 9 mL 3   TOUJEO  SOLOSTAR 300 UNIT/ML Solostar Pen Inject 34-36 Units into the skin at bedtime. 12 mL 3   ACCU-CHEK GUIDE test strip USE AS DIRECTED TO TEST BLOOD SUGAR FOUR TIMES DAILY (Patient not taking: Reported on 07/11/2023) 400 strip 1   acetaminophen -codeine  (TYLENOL  #3) 300-30 MG tablet Take 1 tablet by mouth every 8 (eight) hours as needed for moderate  pain (pain score 4-6). (Patient not taking: Reported on 07/11/2023) 21 tablet 0   nitroGLYCERIN  (NITROSTAT ) 0.4 MG SL tablet Place 1 tablet (0.4 mg total) under the tongue every 5 (five) minutes as needed for chest pain. (Patient not taking: Reported on 07/11/2023) 25 tablet 3   polyethylene glycol (MIRALAX  / GLYCOLAX ) 17 g packet Take 17 g by mouth as needed. (Patient not taking: Reported on 07/11/2023)     No current facility-administered medications on file prior to visit.     The following portions of the patient's history were reviewed and updated as appropriate: allergies, current medications, past family history, past medical history, past social history, past surgical history and problem list.  ROS Otherwise as in subjective above    Objective: BP (!) 110/50   Pulse 67   SpO2 97%   Wt Readings from Last 3 Encounters:  04/04/23 218 lb (98.9 kg)  04/04/22 218 lb (98.9 kg)  12/07/21 212 lb (96.2 kg)    General appearance: alert, no distress, well developed, well nourished, seated in power chair In the middle of the gluteal cleft is a somewhat of a star-shaped ulcer that is only slightly deep approximately 1 mm in depth.  The actual ulcerated area is only about a centimeter diameter but there is some macerated skin surrounding this area.  The entire gluteal cleft is discolored suggesting some moisture and stasis.  There is no obvious cyst at the top of the gluteal cleft.  No obvious pus induration or fluctuance. Exam chaperoned by nurse    Assessment: Encounter Diagnoses  Name Primary?   Skin ulcer, limited to breakdown of skin (HCC) Yes   Type 2 diabetes mellitus with diabetic nephropathy, with long-term current use of insulin  (HCC)    Hypoglycemia      Plan: Stasis ulcer At this point lets discontinue Neosporin or ointment The area needs to be dry and avoid moisture and sweat You may need to use towels and blot to dry the area more than once a day Certainly continue  good hygiene with soap and water Begin nystatin powder 3 times a day as there is likely a fungal component given the moisture If you are able to lying naked on the bed on your side or belly that may be helpful some too I will put in the order for home health nurse to come out and check on the ulcer for the next few weeks   Diabetes with recent hypoglycemia We want to avoid low blood sugar readings less than  70 If you are currently at 34 units of Toujeo  back down to 29 units daily for the next week or 2 and monitor your blood sugars. Continue other medicines as usual If you continue to get low blood sugar readings around 70 or less then back down on the Toujeo  at least 2 or 3 more units and let us  know   Recheck in office 2-3 weeks  Berdine was seen today for establish care.  Diagnoses and all orders for this visit:  Skin ulcer, limited to breakdown of skin (HCC) -     Ambulatory referral to Home Health  Type 2 diabetes mellitus with diabetic nephropathy, with long-term current use of insulin  (HCC) -     Ambulatory referral to Home Health  Hypoglycemia  Other orders -     nystatin (MYCOSTATIN/NYSTOP) powder; Apply 1 Application topically 3 (three) times daily.    Follow up: 36mo

## 2023-07-13 ENCOUNTER — Encounter (INDEPENDENT_AMBULATORY_CARE_PROVIDER_SITE_OTHER): Payer: Self-pay | Admitting: Ophthalmology

## 2023-07-15 ENCOUNTER — Ambulatory Visit: Payer: Self-pay

## 2023-07-15 NOTE — Telephone Encounter (Signed)
 Copied from CRM [id number]. Topic: Clinical - Red Word Triage >> Jul 15, 2023 10:43 AM Cassandra Stewart wrote: Red Word that prompted transfer to Nurse Triage: Patient has an ulcer in the buttock that was bleeding on 5/19.  Chief Complaint: pressure ulcer to middle of butt, states in crack Symptoms: pain, bleeding Frequency: when changing dressing Pertinent Negatives: Patient denies fever, chills Disposition: [] ED /[] Urgent Care (no appt availability in office) / [x] Appointment(In office/virtual)/ []  [facility]/ [] Home Care/ [] Refused Recommended Disposition /[] [facility]/ []  Follow-up with PCP Additional Notes: apt scheduled per protocol request; care advice given, denies questions; instructed to go to ER if becomes worse. Care coordinator from insurance also states that patient is due for colonoscopy and mammogram.   Reason for Disposition  [1] Wound > 48 hours old AND [2] becoming more painful or tender to touch  Answer Assessment - Initial Assessment Questions 1. LOCATION: "Where is the wound located?"      Crack of buttocks 2. WOUND APPEARANCE: "What does the wound look like?"      Was bleeding when patient changes bandage 3. SIZE: If redness is present, ask: "What is the size of the red area?" (Inches, centimeters, or compare to size of a coin)      Pain to the area, unknown 4. SPREAD: "What's changed in the last day?"  "Do you see any red streaks coming from the wound?"     bleeding 5. ONSET: "When did it start to look infected?"      Can't see it 6. MECHANISM: "How did the wound start, what was the cause?"     Pressure sore 7. PAIN: Do you have any pain?"  If Yes, ask: "How bad is the pain?"  (e.g., Scale 1-10; mild, moderate, or severe)    - MILD (1-3): Doesn't interfere with normal activities.     - MODERATE (4-7): Interferes with normal activities or awakens from sleep.    - SEVERE (8-10): Excruciating pain, unable to do any normal activities.        moderate 8. FEVER: "Do you have a fever?" If Yes, ask: "What is your temperature, how was it measured, and when did it start?"     no 9. OTHER SYMPTOMS: "Do you have any other symptoms?" (e.g., shaking chills, weakness, rash elsewhere on body)     no 10. PREGNANCY: "Is there any chance you are pregnant?" "When was your last menstrual period?"       na  Protocols used: Wound Infection Suspected-A-AH

## 2023-07-17 ENCOUNTER — Telehealth: Payer: Self-pay

## 2023-07-17 ENCOUNTER — Ambulatory Visit: Payer: Self-pay

## 2023-07-17 ENCOUNTER — Ambulatory Visit: Admitting: Medical

## 2023-07-17 NOTE — Telephone Encounter (Signed)
 Reason for Disposition  . Requesting regular office appointment    Protocols used: Information Only Call - No Triage-A-AH

## 2023-07-17 NOTE — Telephone Encounter (Signed)
 Spoke with patient and she rescheduled to tomorrow @ 2:30 with SB, this was her preference.

## 2023-07-17 NOTE — Telephone Encounter (Signed)
 Copied from CRM [id number]. Topic: Clinical - Medical Advice >> Jul 17, 2023  9:23 AM Rosaria Common wrote: Reason for CRM: Pt has a painful sore on buttocks. Need to reschedule appt for today, but next available isn't until May 28 and pt states she cannot wait that long. Warm transfer to nurse.   Chief Complaint: Medical Advice Symptoms: wound on buttocks, open Frequency: . Pertinent Negatives: Patient denies fever Disposition: [] ED /[] Urgent Care (no appt availability in office) / [] Appointment(In office/virtual)/ []  [facility]/ [] Home Care/ [] Refused Recommended Disposition /[] [facility]/ []  Follow-up with PCP Additional Notes: Pt reports open sore on buttock. She is unable to keep appt today to due to transportation and wanted to know if anything can be done or if she can receive treatment. Patient states that she is wheelchair bound and doesn't have help at home. She also isrequesting an order for a purewick to use at home.  RN offered virtual visit, pt declined, stating that she is "not tech savvy" and doesn't have MyChart set up. RN recommending ED for treatment, patient stated that she will call her endocrinologist to see if they can assist.  Answer Assessment - Initial Assessment Questions 1. REASON FOR CALL or QUESTION: "What is your reason for calling today?" or "How can I best help you?" or "What question do you have that I can help answer?"     Patient asking for sooner appt  Protocols used: Information Only Call - No Triage-A-AH

## 2023-07-17 NOTE — Telephone Encounter (Signed)
 Please advise, I have looked through the pts chart and I do not see that it was mentioned to come in sooner if she developed a wound. She was also just seen on 07/11/23 for the skin ulcer. Do you think she needs to be seen sooner by Dr.Gherghe?

## 2023-07-17 NOTE — Telephone Encounter (Signed)
 Pt has been notified and voices understanding.

## 2023-07-17 NOTE — Telephone Encounter (Signed)
 Patient called stating need an appointment due to her being a diabetic, she can't walk and sound like she had a decubitus ulcer or sore on her backside and she bleed a little bit last night.  Patient verbalized, the provider told her if she ever has an open sore to contact us  to moved up sooner.  Advised patient I would sent note back to the nurse and have them give you a call to determine if you need a sooner appointment.

## 2023-07-18 ENCOUNTER — Encounter: Payer: Self-pay | Admitting: Nurse Practitioner

## 2023-07-18 ENCOUNTER — Ambulatory Visit (INDEPENDENT_AMBULATORY_CARE_PROVIDER_SITE_OTHER): Admitting: Nurse Practitioner

## 2023-07-18 ENCOUNTER — Other Ambulatory Visit (HOSPITAL_COMMUNITY): Payer: Self-pay

## 2023-07-18 VITALS — BP 124/70 | HR 77

## 2023-07-18 DIAGNOSIS — N3945 Continuous leakage: Secondary | ICD-10-CM | POA: Diagnosis not present

## 2023-07-18 DIAGNOSIS — L89313 Pressure ulcer of right buttock, stage 3: Secondary | ICD-10-CM

## 2023-07-18 DIAGNOSIS — B379 Candidiasis, unspecified: Secondary | ICD-10-CM | POA: Diagnosis not present

## 2023-07-18 DIAGNOSIS — S31000A Unspecified open wound of lower back and pelvis without penetration into retroperitoneum, initial encounter: Secondary | ICD-10-CM | POA: Insufficient documentation

## 2023-07-18 DIAGNOSIS — G8222 Paraplegia, incomplete: Secondary | ICD-10-CM

## 2023-07-18 MED ORDER — NYSTATIN 100000 UNIT/GM EX POWD
1.0000 | Freq: Three times a day (TID) | CUTANEOUS | 3 refills | Status: AC
  Filled 2023-07-18 – 2023-07-22 (×2): qty 60, 20d supply, fill #0

## 2023-07-18 MED ORDER — AMBULATORY NON FORMULARY MEDICATION
0 refills | Status: AC
Start: 2023-07-18 — End: ?

## 2023-07-18 MED ORDER — AMBULATORY NON FORMULARY MEDICATION: 11 refills | Status: AC

## 2023-07-18 MED ORDER — AMBULATORY NON FORMULARY MEDICATION
11 refills | Status: AC
Start: 2023-07-18 — End: ?

## 2023-07-18 NOTE — Assessment & Plan Note (Signed)
 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 Full thickness area of ulcer measures approximately 1 cm by 1 cm with larger partial thickness area measuring 2cm x 1.5 cm. The area has serous drainage and shows no signs of infection. Concern that ongoing incontinence and prolonged immobility in a wheelchair complicate dryness maintenance and prolong healing. In the setting of diabetes, there is significant concern for delayed healing and infection that could cause serious complications. Management strategies of PureWick and hydrocolloid dressings would provide the appropriate treatment and help reduce the risk of further breakdown, moisture, and infection.  - Provide soft 4x4 gauze squares for powder application to the ulcer until additional dressings can be obtained.  - Advise use of a hairdryer on a cool setting to dry the area after bathing and changing pull-ups. - Prescribe PureWick system and send prescription to DME for [employer] for coverage verification. - Prescribe hydrocolloid dressings (Duoderm) and send prescription to DME, as well. - Instruct to monitor for signs of infection, such as bleeding, purulent drainage, or odor, and report immediately if these occur. - Schedule follow-up appointment in six weeks to reassess the ulcer.

## 2023-07-18 NOTE — Patient Instructions (Signed)
 Pressure Injury  A pressure injury, also called a pressure ulcer or bedsore, is an injury to skin and the tissue under the skin that is caused by pressure. It often affects people who must spend a long time in a bed or chair because of a medical condition. Pressure injuries often occur: Over bony parts of the body, such as the tailbone, shoulders, elbows, hips, heels, spine, ankles, and back of the head. Under medical devices that touch the body. These include stockings, equipment to help with breathing, tubes, and splints. Inside the mouth or nose from dentures or tubes. Pressure injuries start as red areas on the skin and can lead to pain and an open wound. What are the causes? This condition is caused by frequent or constant pressure to an area of the body. Less blood flow to the skin can make the tissue die and break down over time, causing a wound. What increases the risk? You are more likely to develop this condition if: You are in the hospital or an extended care facility. You are bedridden or in a wheelchair. You have an injury or disease that keeps you from moving well and feeling pain or pressure. You have a condition that: Makes you sleepy or less alert. Causes poor blood flow. You need to wear a medical device. You have poor control of your bladder or bowel movements (incontinence). You are not getting enough fluid or nutrients (malnutrition). Your health care provider may recommend certain types of mattresses, mattress covers, pillows, cushions, or boots to help prevent a pressure injury. These may include products filled with air, foam, gel, or sand. What are the signs or symptoms? Symptoms of this condition depend on how severe your injury is. Symptoms may include: Red or dark areas of the skin. Pain or a change in skin texture. Your skin may feel warmer, cooler, softer, or firmer. Blisters. An open wound. How is this diagnosed? This condition is diagnosed based on a  medical history and physical exam. You may also have tests, such as: Blood tests. Imaging tests. Blood flow tests. Your injury will be staged based on how severe it is. Staging is based on: How deep the tissue injury is. This includes whether muscle, bone, tendon, or dead tissue is exposed. The cause of the injury. How is this treated? This condition may be treated by: Reducing pressure on your skin. You may need to: Change your position often. Avoid positions that caused the wound or that may make the wound worse. Use certain mattresses, overlays, chair cushions, or protective boots. Move medical devices from an area of pressure, or place padding between the skin and the device. Use foams, creams, or powders to protect your skin from sweat, urine, and stool and reduce rubbing (friction) on the skin. Keeping your skin clean and dry. This may include using a skin cleanser or barrier as told by your health care provider. Cleaning your injury and getting rid of any dead tissue from the wound (debridement). Placing a protective medicine, such as a cream, or bandage (dressing) over your injury. Using medicines for pain or to prevent or treat infection. Surgery may be needed if other treatments are not working or if your injury is very deep. Follow these instructions at home: Medicines Take over-the-counter and prescription medicines only as told by your health care provider. If you were prescribed antibiotics, take or apply them as told by your health care provider. Do not stop using the antibiotic even if you start to  feel better. Eating and drinking Drink enough fluid to keep your urine pale yellow. Eat a healthy diet with lots of protein, as told by your health care provider. Do not use drugs or drink alcohol. Wound care Follow instructions from your health care provider about how to take care of your wound. Make sure you: Wash your hands with soap and water before and after you change  your dressing or apply medicine to your skin. If soap and water are not available, use hand sanitizer. Change your dressing as told by your health care provider. Check your wound every day for signs of infection. Have a caregiver do this for you if you are not able. Check for: Redness, swelling, or more pain. More fluid or blood. Warmth. Pus or a bad smell. Skin care Keep your skin clean and dry. Gently pat your skin dry. Do not rub or massage your skin. Check your skin every day for any changes in color or any new blisters or sores (ulcers). Reducing pressure Do not lie or sit in one position for a long time. Move or change position every 1-2 hours, or as told by your health care provider. Use pillows or cushions to reduce pressure. Ask your health care provider what cushions or pads you should use. General instructions Do not use any products that contain nicotine or tobacco. These products include cigarettes, chewing tobacco, and vaping devices, such as e-cigarettes. If you need help quitting, ask your health care provider. Try to be active every day. Ask your health care provider what exercises or activities are safe for you. Keep all follow-up visits. Your health care provider will check if your injury is healing. Contact a health care provider if: You have a fever or chills. You have pain that does not get better with medicine. Your skin changes color. You have new blisters or sores. You have signs of infection. Your wound does not get better after 1-2 weeks of treatment. This information is not intended to replace advice given to you by your health care provider. Make sure you discuss any questions you have with your health care provider. Document Revised: 08/07/2021 Document Reviewed: 07/13/2021 Elsevier Patient Education  2024 [employer].

## 2023-07-18 NOTE — Progress Notes (Signed)
 Annella Kief, DNP, AGNP-c [facility] [address] [place], [place] [address] [phone]   ACUTE VISIT- ESTABLISHED PATIENT  Blood pressure 124/70, pulse 77, SpO2 94%.  Subjective:  HPI Cassandra Stewart is a 73 y.o. female presents to day for evaluation of acute concern(s).   History of Present Illness Cassandra Stewart is a 73 year old female who presents with a pressure ulcer on her backside.  She initially noticed the ulcer last year, feeling it during bathroom use. At that time there was no evidence of drainage or infection. She was instructed to apply Neosporin to the area, which has been effective until recently. In the last few weeks she noted burning with application of the medication, leading her to seek further medical advice.  She consulted with my colleague last Friday, who identified the lesion as a pressure ulcer and advised against using Neosporin. She was prescribed nystatin  powder and instructed to keep the area dry. However, she faces challenges in maintaining dryness due to limited mobility and chronic wheelchair use as well as urinary incontinence. She is also unable to visualize the area, which has also complicated ulcer management.  She experienced bleeding from the ulcer after showering and changing into her overnight pull-up in the last few days. She has been using the prescribed powder and attempting to keep the area dry by applying the powder to toilet tissue and keeping it over the area. Otherwise, she reports the majority of the powder ends up in the toilet.  She has difficulty keeping the area dry due to being wheelchair bound and her urinary incontinence. Her sleeping position, which is on her back with elevated legs due to ankle swelling also creates concerns for added pressure.   She has been in contact with [employer] regarding coverage for a Purewick. She was instructed that this may be covered under her current plan. She has  used this while hospitalized in the past and had excellent success with managing her urinary incontinence.  She discusses alternative treatments suggested by her daughter-in-law who is a wound care nurse, including Betadine and Duoderm. She would like to know if these would be recommended.   She is concerned about the ulcer worsening, referencing a neighbor's experience with a severe bedsore. She is proactive in seeking solutions to prevent the ulcer from opening further.  No current pain in the ulcer area after using the powder and no signs of infection such as pus or odor from the ulcer.  1.5cm x 1cm sacral wound stage II (2.5x 1.5cm and III with    ROS negative except for what is listed in HPI. History, Medications, Surgery, SDOH, and Family History reviewed and updated as appropriate.  Objective:  Physical Exam Vitals and nursing note reviewed.  Constitutional:      General: She is not in acute distress.    Appearance: Normal appearance. She is obese.  Skin:    General: Skin is warm and dry.          Comments: 1.5cm wide x 2cm tall area of partial thickness break down into dermis. 1cm x 1cm area inside the larger area of full thickness tissue loss extending deeper into the subcutaneous tissue layers. Total depth 2mm with no evidence of tunneling. Serous drainage is present.   Neurological:     General: No focal deficit present.     Mental Status: She is alert and oriented to person, place, and time.     Sensory: Sensory deficit present.     Motor:  Weakness present.  Psychiatric:        Mood and Affect: Mood normal.        Behavior: Behavior normal.         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 Full thickness area of ulcer measures approximately  1 cm by 1 cm with larger partial thickness area measuring 2cm x 1.5 cm. The area has serous drainage and shows no signs of infection. Concern that ongoing incontinence and prolonged immobility in a wheelchair complicate dryness maintenance and prolong healing. In the setting of diabetes, there is significant concern for delayed healing and infection that could cause serious complications. Management strategies of PureWick and hydrocolloid dressings would provide the appropriate treatment and help reduce the risk of further breakdown, moisture, and infection.  - Provide soft 4x4 gauze squares for powder application to the ulcer until additional dressings can be obtained.  - Advise use of a hairdryer on a cool setting to dry the area after bathing and changing pull-ups. - Prescribe PureWick system and send prescription to DME for [employer] for coverage verification. - Prescribe hydrocolloid dressings (Duoderm) and send prescription to DME, as well. - Instruct to monitor for signs of infection, such as bleeding, purulent drainage, or odor, and report immediately if these occur. - Schedule follow-up appointment in six weeks to reassess the ulcer.      Relevant Medications   AMBULATORY NON FORMULARY MEDICATION   AMBULATORY NON FORMULARY MEDICATION   AMBULATORY NON FORMULARY MEDICATION   AMBULATORY NON FORMULARY MEDICATION   AMBULATORY NON FORMULARY MEDICATION   AMBULATORY NON FORMULARY MEDICATION   nystatin  (MYCOSTATIN /NYSTOP ) powder   Continuous leakage of urine   Relevant Medications   AMBULATORY NON FORMULARY MEDICATION   AMBULATORY NON FORMULARY MEDICATION   AMBULATORY NON FORMULARY MEDICATION   AMBULATORY NON FORMULARY MEDICATION   AMBULATORY NON FORMULARY MEDICATION   AMBULATORY NON FORMULARY MEDICATION   nystatin  (MYCOSTATIN /NYSTOP ) powder   Chronic incomplete paraplegia (HCC)   Relevant Medications   AMBULATORY NON FORMULARY MEDICATION   AMBULATORY NON FORMULARY MEDICATION    AMBULATORY NON FORMULARY MEDICATION   AMBULATORY NON FORMULARY MEDICATION   AMBULATORY NON FORMULARY MEDICATION   AMBULATORY NON FORMULARY MEDICATION   nystatin  (MYCOSTATIN /NYSTOP ) powder   Other Visit Diagnoses       Candida infection       Relevant Medications   nystatin  (MYCOSTATIN /NYSTOP ) powder         Annella Kief, DNP, AGNP-c

## 2023-07-22 ENCOUNTER — Other Ambulatory Visit (HOSPITAL_COMMUNITY): Payer: Self-pay

## 2023-07-22 ENCOUNTER — Ambulatory Visit: Admitting: Family Medicine

## 2023-07-23 DIAGNOSIS — E1165 Type 2 diabetes mellitus with hyperglycemia: Secondary | ICD-10-CM | POA: Diagnosis not present

## 2023-07-23 DIAGNOSIS — Z794 Long term (current) use of insulin: Secondary | ICD-10-CM | POA: Diagnosis not present

## 2023-07-23 DIAGNOSIS — E118 Type 2 diabetes mellitus with unspecified complications: Secondary | ICD-10-CM | POA: Diagnosis not present

## 2023-07-28 ENCOUNTER — Ambulatory Visit: Admitting: Family Medicine

## 2023-08-01 ENCOUNTER — Other Ambulatory Visit (HOSPITAL_COMMUNITY): Payer: Self-pay

## 2023-08-01 ENCOUNTER — Ambulatory Visit: Admitting: Nurse Practitioner

## 2023-08-14 ENCOUNTER — Encounter: Payer: Self-pay | Admitting: Internal Medicine

## 2023-08-14 ENCOUNTER — Other Ambulatory Visit: Payer: Self-pay

## 2023-08-14 ENCOUNTER — Other Ambulatory Visit (HOSPITAL_COMMUNITY): Payer: Self-pay

## 2023-08-14 ENCOUNTER — Ambulatory Visit (INDEPENDENT_AMBULATORY_CARE_PROVIDER_SITE_OTHER): Payer: 59 | Admitting: Internal Medicine

## 2023-08-14 VITALS — BP 120/70 | HR 76 | Ht 64.0 in | Wt 207.4 lb

## 2023-08-14 DIAGNOSIS — E1142 Type 2 diabetes mellitus with diabetic polyneuropathy: Secondary | ICD-10-CM | POA: Diagnosis not present

## 2023-08-14 DIAGNOSIS — E782 Mixed hyperlipidemia: Secondary | ICD-10-CM

## 2023-08-14 DIAGNOSIS — E538 Deficiency of other specified B group vitamins: Secondary | ICD-10-CM

## 2023-08-14 DIAGNOSIS — Z7984 Long term (current) use of oral hypoglycemic drugs: Secondary | ICD-10-CM

## 2023-08-14 DIAGNOSIS — E1165 Type 2 diabetes mellitus with hyperglycemia: Secondary | ICD-10-CM

## 2023-08-14 DIAGNOSIS — Z794 Long term (current) use of insulin: Secondary | ICD-10-CM

## 2023-08-14 LAB — POCT GLYCOSYLATED HEMOGLOBIN (HGB A1C): Hemoglobin A1C: 7 % — AB (ref 4.0–5.6)

## 2023-08-14 MED ORDER — TOUJEO SOLOSTAR 300 UNIT/ML ~~LOC~~ SOPN
30.0000 [IU] | PEN_INJECTOR | Freq: Every day | SUBCUTANEOUS | 3 refills | Status: AC
  Filled 2023-08-14 (×2): qty 9, 84d supply, fill #0
  Filled 2023-12-02: qty 9, 84d supply, fill #1
  Filled 2024-03-08: qty 9, 84d supply, fill #2

## 2023-08-14 MED ORDER — OZEMPIC (2 MG/DOSE) 8 MG/3ML ~~LOC~~ SOPN
2.0000 mg | PEN_INJECTOR | SUBCUTANEOUS | 3 refills | Status: AC
  Filled 2023-08-14: qty 9, fill #0
  Filled 2023-08-14: qty 9, 84d supply, fill #0
  Filled 2023-12-02: qty 9, 84d supply, fill #1
  Filled 2024-03-08: qty 9, 84d supply, fill #2

## 2023-08-14 NOTE — Progress Notes (Signed)
 Patient ID: Cassandra Stewart, female   DOB: [birth year], 73 y.o.   MRN: [id number]  This note was precharted 04/07/2023.  HPI: Cassandra Stewart is a 73 y.o.-year-old female, returning for follow-up for DM2, dx in 1992, insulin -dependent since 2006, uncontrolled, with complications (CKD stage 3, PN, DR).  Last visit 4 months ago  Interim history: No increased urination, blurry vision, nausea.   She has a stage III pressure ulcer on her buttock, followed by PCP. She mentions that she recently relaxed her diet including more pizza, [place] foods, and sugars have been higher.   Reviewed HbA1c levels: Lab Results  Component Value Date   HGBA1C 6.8 (A) 04/11/2023   HGBA1C 8.2 (H) 01/13/2023   HGBA1C 7.5 (A) 08/21/2022   HGBA1C 7.5 (A) 04/04/2022   HGBA1C 7.3 (A) 11/28/2021   HGBA1C 7.7 (A) 07/19/2021   HGBA1C 8.5 (A) 04/09/2021   HGBA1C 7.1 (A) 12/06/2020   HGBA1C 8.5 (A) 08/04/2020   HGBA1C 9.9 (A) 04/06/2020   HGBA1C 7.4 (A) 12/02/2019   HGBA1C 9.3 (A) 07/29/2019   HGBA1C 9.6 (A) 03/30/2019   HGBA1C 9.8 (A) 12/28/2018   HGBA1C 10.7 (A) 07/13/2018   HGBA1C 6.8 (A) 03/16/2018   HGBA1C 6.1 (A) 11/12/2017   HGBA1C 9.0 07/11/2017   HGBA1C 9.0 04/14/2017   HGBA1C 8.7% 10/24/2016  03/16/2018: HbA1c calculated from fructosamine 6.28%  Pt was on a regimen of: - Metformin  500 mg 1x a day with dinner.  She had diarrhea with a higher dose. - Toujeo  45 units in am - Humalog  18 units 2-3x a day, before meals Tried: Actos, Lantus .  Now on: - Metformin  ER 500 mg 1-2x a day with meals. - Trulicity  1.5 >> 3 mg weekly - restarted 03/2021 - intermittently out 2/2 lack of supply (off for 3 weeks) >> Ozempic  0.5 >> 1 >> 2 mg weekly >> actually using 1 mg weekly - Toujeo  35 >> 28 >> 36 >> 40 >> 34 >> 36 >> 28 >> 30 units at night (decreased by PCP) - Humalog  7-12 units before meals and 5 >> 7 units before snack at night >> 12-20 units before the meals (higher doses with lunch), and 7 units before  the snack at night   She checks her blood sugars >4 times a day with her Dexcom CGM (with receiver):  Prev.:   Previously:  Lowest sugar was 40 >> 49 >> 49 >> 69 >> 60s; she has hypoglycemia awareness in the 80s. Highest sugar was 357 >> 393 (cereal: fruity pebbles) >> 260 >> 283 >> 300s  Glucometer: Freestyle  Pt's meals are: - Breakfast/brunch: egg, bacon, toast >> no b'fast  - Lunch: snack mostly (no Humalog ) >> Meals on Wheels - Dinner: chicken/fish + vegetables >> moved before 7 pm - Snacks: Belvita cracker if hungry  -+ Stage III CKD; last BUN/creatinine:  Lab Results  Component Value Date   BUN 15 01/13/2023   BUN 18 10/15/2021   CREATININE 1.09 (H) 01/13/2023   CREATININE 1.00 10/15/2021   Lab Results  Component Value Date   MICRALBCREAT 16 07/25/2016   MICRALBCREAT 57 (H) 10/25/2015   Lab Results  Component Value Date   GFRAA 54 (L) 03/02/2020   GFRAA 59 (L) 08/31/2019   GFRAA 58 (L) 03/24/2019   GFRAA 59 (L) 03/12/2019   GFRAA 55 (L) 03/08/2019  Previously on lisinopril  10, but stopped due to hyperkalemia.   -+ HL; last set of lipids: Lab Results  Component Value Date   CHOL 144 04/11/2023  HDL 66 04/11/2023   LDLCALC 56 04/11/2023   TRIG 137 04/11/2023   CHOLHDL 2.2 04/11/2023  On lovastatin  40 mg daily.  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 Karyl Stewart).  She has history of cataract surgery OU.  -+ Numbness and tingling in her fingers-on Neurontin  300 mg 3x a day per PCP.  Latest foot exam 04/11/2023.  Low vitamin B12:  Reviewed B12 levels: Lab Results  Component Value Date   VITAMINB12 1,032 01/13/2023   VITAMINB12 >1500 (H) 11/28/2021   VITAMINB12 >1550 (H) 12/06/2020   VITAMINB12 >1526 (H) 07/29/2019   VITAMINB12 >1500 (H) 03/30/2019   VITAMINB12 >1500 (H) 12/28/2018   VITAMINB12 261 03/16/2018   VITAMINB12 300 04/14/2017   VITAMINB12 478 10/25/2015  05/01/2016: Vit B12 248.  We initially started 5000 mcg  B12 daily, which was then decreased to 2500 mcg daily and, then to 1000 mcg daily >> every other day >> 2x a week - 11/2020 >> decreased to once a week 11/2021.  She continues to be in a wheelchair as her left leg is very weak-believed to be from a herniated intervertebral disc.  She had surgery for this in 2016 but the strength did not improve. She was in the emergency room on 10/24/2020 for R wrist pain and swelling.  She also sees Dr. Glenice Stewart.  No fracture. Has a brace. Her ulnar nerve is affected by using her home wheelchair. She was in the emergency room 11/28/2020 for chest pain.  Troponins were negative.  D-dimer was still elevated. She continues to have neck and back pain.   She has an extensive FH of heart disease - women in her family. Daughter died after her heart stopped.  ROS: + See HPI  I reviewed pt's medications, allergies, PMH, social hx, family hx, and changes were documented in the history of present illness. Otherwise, unchanged from my initial visit note.  Past Medical History:  Diagnosis Date   Abdominal pain, chronic, left lower quadrant 08/25/2018   Elevated LFTs    Family history of heart disease 12/04/2018   Fatty liver    on US .    Hyperlipemia    Hypertension    Inguinal hernia    2020 CT   Morbid obesity (HCC) 07/04/2015   Numbness and tingling in right hand 03/01/2021   Palpitation 03/08/2021   Personal history of noncompliance with medical treatment, presenting hazards to health 01/29/2017   Stage 3b chronic kidney disease (HCC)    Thoracic spondylosis with myelopathy    Umbilical hernia    2020 CT   Uncontrolled type 2 diabetes with neuropathy    Weakness of left leg 07/04/2015   Wheelchair bound    paraplegia of Leg - Left   Past Surgical History:  Procedure Laterality Date   APPENDECTOMY  1969   BREAST BIOPSY Right    CATARACT EXTRACTION     May 16109   LUMBAR DISC SURGERY     LUMBAR LAMINECTOMY/DECOMPRESSION MICRODISCECTOMY N/A 07/06/2015    Procedure: Thoracic ten- eleven laminectomy for spinal canal decompression;  Surgeon: Cassandra Bleacher, MD;  Location: [facility];  Service: Orthopedics;  Laterality: N/A;   Social History   Social History   Marital status: Divorced    Spouse name: N/A   Number of children: 0   Occupational History   None   Social History Main Topics   Smoking status: Never Smoker   Smokeless tobacco: Never Used   Alcohol use  No   Drug use: No   Current Outpatient Medications on File Prior to Visit  Medication Sig Dispense Refill   ACCU-CHEK GUIDE test strip USE AS DIRECTED TO TEST BLOOD SUGAR FOUR TIMES DAILY (Patient not taking: Reported on 07/18/2023) 400 strip 1   acetaminophen  (TYLENOL ) 650 MG CR tablet Take 650 mg by mouth every 8 (eight) hours as needed for pain.     acetaminophen -codeine  (TYLENOL  #3) 300-30 MG tablet Take 1 tablet by mouth every 8 (eight) hours as needed for moderate pain (pain score 4-6). (Patient not taking: Reported on 07/18/2023) 21 tablet 0   AMBULATORY NON FORMULARY MEDICATION Suction pump, home model, portable or stationary, electric, any type, for use with PureWick urinary collection system. 1 each 0   AMBULATORY NON FORMULARY MEDICATION Pump tubing, used with suction pump, each. [phone]) For use with PureWick urinary collection system. Replace tube every 60 days or sooner with signs of wear. 1 each 11   AMBULATORY NON FORMULARY MEDICATION Collector tubing, used with suction pump, each. [phone]) For use with PureWick urinary collection system. Replace tube every 60 days or sooner with signs of wear. 1 each 11   AMBULATORY NON FORMULARY MEDICATION Canister, non disposable, used with suction pump, each. [phone]) For use with PureWick urinary collection system. Replace canister every 60 days. 1 each 11   AMBULATORY NON FORMULARY MEDICATION External urinary catheter, disposable, with wicking material, for use with suction pump, per month. [phone]). For use with PureWick urinary  collection system. Apply new external catheter every 8-12 hours for urinary collection to allow for wound healing to buttocks. 90 each 11   AMBULATORY NON FORMULARY MEDICATION Hydrocolloid dressing, for sacral pressure wound, coverage for 2 x 2 area, any brand. Change dressing every 3 days or sooner if soiled. For continuous use until discontinued. 30 each 11   amLODipine  (NORVASC ) 10 MG tablet Take 1 tablet (10 mg total) by mouth daily. 90 tablet 3   aspirin  EC 81 MG tablet Take 81 mg by mouth every evening.     Cholecalciferol (VITAMIN D  PO) Take 1,000 mg by mouth daily.      Continuous Blood Gluc Receiver (DEXCOM G6 RECEIVER) DEVI by Does not apply route.     Cyanocobalamin  (VITAMIN B 12 PO) Take 5,000 mg by mouth daily.     diclofenac  Sodium (VOLTAREN ) 1 % GEL Apply 4 g topically 4 (four) times daily. 100 g 0   dicyclomine  (BENTYL ) 10 MG capsule Take 1 capsule (10 mg total) by mouth 3 (three) times daily before meals. 270 capsule 3   gabapentin  (NEURONTIN ) 300 MG capsule Take 1 capsule (300 mg total) by mouth 3 (three) times daily. 270 capsule 3   Glucagon  (GVOKE HYPOPEN  2-PACK) 1 MG/0.2ML SOAJ Use for emergency low blood sugar treatment. 0.4 mL 3   hydrochlorothiazide  (MICROZIDE ) 12.5 MG capsule Take 1 capsule (12.5 mg total) by mouth daily. 90 capsule 1   insulin  lispro (HUMALOG ) 100 UNIT/ML KwikPen INJECT 12-20 UNITS UNDER THE SKIN 2-3 TIMES DAILY 45 mL 3   Insulin  Pen Needle (CAREFINE PEN NEEDLES) 32G X 4 MM MISC Use 4x a day 300 each 3   lovastatin  (MEVACOR ) 40 MG tablet Take 1 tablet (40 mg total) by mouth at bedtime. 90 tablet 1   metFORMIN  (GLUCOPHAGE -XR) 500 MG 24 hr tablet TAKE 1 TABLET(500 MG) BY MOUTH TWICE DAILY 180 tablet 1   nitroGLYCERIN  (NITROSTAT ) 0.4 MG SL tablet Place 1 tablet (0.4 mg total) under the tongue every 5 (five)  minutes as needed for chest pain. 25 tablet 3   nystatin  (MYCOSTATIN /NYSTOP ) powder Apply 1 Application topically 3 (three) times daily. 60 g 3    polyethylene glycol (MIRALAX  / GLYCOLAX ) 17 g packet Take 17 g by mouth as needed.     Semaglutide , 1 MG/DOSE, (OZEMPIC , 1 MG/DOSE,) 4 MG/3ML SOPN Inject 1 mg into the skin once a week. 9 mL 3   TOUJEO  SOLOSTAR 300 UNIT/ML Solostar Pen Inject 34-36 Units into the skin at bedtime. 12 mL 3   No current facility-administered medications on file prior to visit.   Allergies  Allergen Reactions   Tramadol  Nausea And Vomiting    Severe N/V- with Tramadol    Other     PT PREFERS TO NOT HAVE ANY NARCOTIC MEDICATIONS   Chlorhexidine Rash    Burning after using CHG wipes-used for surgery   Oxycodone  Hcl     Other reaction(s): Hallucination Marked hallucinations and palpitations following dose of 10mg  on 04/30/2015    Family History  Problem Relation Age of Onset   Diabetes Mother    Heart disease Mother    Pancreatic cancer Sister    Kidney disease Brother        Two brothers on ESRD   Amblyopia Neg Hx    Blindness Neg Hx    Cataracts Neg Hx    Glaucoma Neg Hx    Macular degeneration Neg Hx    Retinal detachment Neg Hx    Strabismus Neg Hx    Retinitis pigmentosa Neg Hx    Colon cancer Neg Hx    Esophageal cancer Neg Hx    Pt has FH of DM in M, MGM, PGM, M aunt, uncles.  PE: BP 120/70   Pulse 76   Ht 5' 4 (1.626 m)   Wt 207 lb 6.4 oz (94.1 kg)   SpO2 96%   BMI 35.60 kg/m  Wt Readings from Last 3 Encounters:  08/14/23 207 lb 6.4 oz (94.1 kg)  04/04/23 218 lb (98.9 kg)  04/04/22 218 lb (98.9 kg)   Constitutional: overweight, in NAD, in wheelchair Eyes: EOMI, no exophthalmos ENT: no thyromegaly, no cervical lymphadenopathy Cardiovascular: RRR, No MRG, + L ankle edema - pitting (after fall 2017) Respiratory: CTA B Musculoskeletal: + paraplegia Skin: no rashes Neurological: no tremor with outstretched hands  ASSESSMENT: 1. DM2, insulin -dependent, uncontrolled, with complications - CKD stage 3 - PN - DR  - Freestyle libre CGM was not covered by the insurance  2. Low  B12  3. HL  PLAN:  1. Patient with longstanding, uncontrolled, type 2 diabetes, on metformin , basal/bolus insulin  regimen and weekly GLP-1 receptor agonist, with an control at last visit.  At that time, HbA1c was 6.8%, improved from 8.2%.  Sugars were significantly improved from the previous visit and they were mostly at goal, but slightly higher in the 2 weeks prior to the visit.  She felt the Trulicity  was not working for her and her sugars improved on Ozempic .  We did not change her regimen at that time. CGM interpretation: -At today's visit, we reviewed her CGM downloads: It appears that 71% of values are in target range (goal >70%), while 29% are higher than 180 (goal <25%), and 0% are lower than 70 (goal <4%).  The calculated average blood sugar is 163.  The projected HbA1c for the next 3 months (GMI) is 7.2%. -Reviewing the CGM trends, sugars appear to be worse compared to before, increasing initially after breakfast and then more so after lunch,  with a peak around 5 PM.  She had another hyperglycemic peak around 10 PM.  Upon questioning, she relaxed her diet since last visit, having more dietary indiscretions.  She is planning to improve these, but for now I did suggest to go back to using the higher dose of Ozempic , 2 mg weekly.  We can continue the same regimen otherwise.  Of note, her Tresiba dose was recently decreased by PCP and will continue the lower dose. - I suggested to:  Patient Instructions  Please continue: - Metformin  ER 500 mg 1-2x a day with meals - Toujeo  30 units at night - Humalog  12-20 units before the meals (higher doses with lunch), and 7 units before the snack at night   Please increase: - Ozempic  2 mg weekly  Please return in 4 months.   - we checked her HbA1c: 7.0% (higher) - advised to check sugars at different times of the day - 4x a day, rotating check times - advised for yearly eye exams >> she is UTD - return to clinic in 4 months  2. Low B12 -Patient  has a history of low B12 for which we started supplementation with p.o. vitamin B12 -We had to reduce the dose subsequently as her B12 was elevated.  She takes 1000 micrograms weekly: - latest B12 level was normal: Lab Results  Component Value Date   VITAMINB12 1,032 01/13/2023   VITAMINB12 >1500 (H) 11/28/2021   3. HL - Latest lipid panel was at goal: Lab Results  Component Value Date   CHOL 144 04/11/2023   HDL 66 04/11/2023   LDLCALC 56 04/11/2023   TRIG 137 04/11/2023   CHOLHDL 2.2 04/11/2023  -She continues on Mevacor  40 mg daily without side effects  Emilie Harden, MD PhD [facility]

## 2023-08-14 NOTE — Patient Instructions (Addendum)
 Please continue: - Metformin  ER 500 mg 1-2x a day with meals - Toujeo  30 units at night - Humalog  12-20 units before the meals (higher doses with lunch), and 7 units before the snack at night   Please increase: - Ozempic  2 mg weekly  Please return in 4 months.

## 2023-08-14 NOTE — Addendum Note (Signed)
 Addended by: Vernon Goodpasture on: 08/14/2023 03:17 PM   Modules accepted: Orders

## 2023-08-15 ENCOUNTER — Ambulatory Visit: Payer: Self-pay | Admitting: Internal Medicine

## 2023-08-15 LAB — MICROALBUMIN / CREATININE URINE RATIO
Creatinine, Urine: 190 mg/dL (ref 20–275)
Microalb Creat Ratio: 74 mg/g{creat} — ABNORMAL HIGH (ref <30)
Microalb, Ur: 14 mg/dL

## 2023-08-22 DIAGNOSIS — Z794 Long term (current) use of insulin: Secondary | ICD-10-CM | POA: Diagnosis not present

## 2023-08-22 DIAGNOSIS — E118 Type 2 diabetes mellitus with unspecified complications: Secondary | ICD-10-CM | POA: Diagnosis not present

## 2023-08-22 DIAGNOSIS — E1165 Type 2 diabetes mellitus with hyperglycemia: Secondary | ICD-10-CM | POA: Diagnosis not present

## 2023-08-25 ENCOUNTER — Other Ambulatory Visit: Payer: Self-pay | Admitting: Internal Medicine

## 2023-08-25 DIAGNOSIS — Z794 Long term (current) use of insulin: Secondary | ICD-10-CM

## 2023-08-25 DIAGNOSIS — L89313 Pressure ulcer of right buttock, stage 3: Secondary | ICD-10-CM

## 2023-08-25 DIAGNOSIS — L98491 Non-pressure chronic ulcer of skin of other sites limited to breakdown of skin: Secondary | ICD-10-CM

## 2023-09-02 ENCOUNTER — Ambulatory Visit: Admitting: Nurse Practitioner

## 2023-09-02 ENCOUNTER — Encounter: Payer: Self-pay | Admitting: Nurse Practitioner

## 2023-09-02 ENCOUNTER — Other Ambulatory Visit (HOSPITAL_COMMUNITY): Payer: Self-pay

## 2023-09-02 VITALS — BP 124/72 | HR 67 | Wt 204.0 lb

## 2023-09-02 DIAGNOSIS — I700 Atherosclerosis of aorta: Secondary | ICD-10-CM

## 2023-09-02 DIAGNOSIS — S31000D Unspecified open wound of lower back and pelvis without penetration into retroperitoneum, subsequent encounter: Secondary | ICD-10-CM | POA: Diagnosis not present

## 2023-09-02 DIAGNOSIS — E66812 Obesity, class 2: Secondary | ICD-10-CM

## 2023-09-02 DIAGNOSIS — K760 Fatty (change of) liver, not elsewhere classified: Secondary | ICD-10-CM

## 2023-09-02 DIAGNOSIS — E1169 Type 2 diabetes mellitus with other specified complication: Secondary | ICD-10-CM

## 2023-09-02 DIAGNOSIS — E1121 Type 2 diabetes mellitus with diabetic nephropathy: Secondary | ICD-10-CM | POA: Diagnosis not present

## 2023-09-02 DIAGNOSIS — N1832 Chronic kidney disease, stage 3b: Secondary | ICD-10-CM | POA: Diagnosis not present

## 2023-09-02 DIAGNOSIS — I152 Hypertension secondary to endocrine disorders: Secondary | ICD-10-CM

## 2023-09-02 DIAGNOSIS — Z794 Long term (current) use of insulin: Secondary | ICD-10-CM | POA: Diagnosis not present

## 2023-09-02 DIAGNOSIS — G8222 Paraplegia, incomplete: Secondary | ICD-10-CM

## 2023-09-02 DIAGNOSIS — N183 Chronic kidney disease, stage 3 unspecified: Secondary | ICD-10-CM | POA: Diagnosis not present

## 2023-09-02 DIAGNOSIS — E1159 Type 2 diabetes mellitus with other circulatory complications: Secondary | ICD-10-CM

## 2023-09-02 DIAGNOSIS — Z6836 Body mass index (BMI) 36.0-36.9, adult: Secondary | ICD-10-CM

## 2023-09-02 MED ORDER — LOVASTATIN 40 MG PO TABS
40.0000 mg | ORAL_TABLET | Freq: Every day | ORAL | 1 refills | Status: DC
  Filled 2023-09-02 – 2023-09-26 (×2): qty 90, 90d supply, fill #0
  Filled 2023-12-19: qty 90, 90d supply, fill #1

## 2023-09-02 MED ORDER — EMPAGLIFLOZIN 25 MG PO TABS: 25.0000 mg | ORAL_TABLET | Freq: Every day | ORAL | Status: DC

## 2023-09-02 MED ORDER — HYDROCHLOROTHIAZIDE 12.5 MG PO CAPS
12.5000 mg | ORAL_CAPSULE | Freq: Every day | ORAL | 1 refills | Status: AC
  Filled 2023-09-02 – 2023-09-26 (×2): qty 90, 90d supply, fill #0
  Filled 2023-12-19: qty 90, 90d supply, fill #1

## 2023-09-02 NOTE — Patient Instructions (Addendum)
 Keep up the great work on your diet changes!! I am so proud of you!!  Please let me know if the wound does not clear up for gets any bigger. Keep using the betadine.   Lets try Jardiance  and see how you do on that. I will work on the patient assistance program to see if we can get this covered for you.

## 2023-09-02 NOTE — Progress Notes (Signed)
 Cassandra Doing, DNP, AGNP-c [facility]  [address] [place], [place] [address] [phone]  ESTABLISHED PATIENT- Chronic Health and/or Follow-Up Visit  Blood pressure 124/72, pulse 67, weight 204 lb (92.5 kg), SpO2 98%.    History of Present Illness Cassandra Stewart is a 73 year old female with diabetes who presents for follow-up on a pressure sore and medication management.  She has experienced significant improvement in her pressure sore, describing it as 'much better' after using Betadine provided by her daughter. She continues to apply Betadine twice daily.  She faces challenges with obtaining a Purwick system through insurance, noting multiple unsuccessful attempts to secure coverage. She expresses frustration with the healthcare system and its impact on Medicaid recipients.  Her wheelchair has malfunctioned, requiring new motors and a battery. She has arranged for a loaner chair while repairs are made, which she hopes will facilitate her mobility for essential activities like grocery shopping and medical appointments.  She manages her diabetes with medications including Ozempic , Humalog , and Jeuveau. She had a past issue with lisinopril  due to potassium level fluctuations, leading to its discontinuation. She now takes potassium supplements once a week. She has a history of albumin in her urine and previously used lisinopril  for kidney protection, which was discontinued due to potassium issues. She is concerned about potential side effects and insurance coverage of other medication options.  She has made dietary changes towards more plant-based foods, fruits, and vegetables, and has reduced meat consumption. She is unable to exercise due to her leg condition and relies on her wheelchair for mobility.  During the review of symptoms, she notes a small, hard papule on her skin. She is vigilant about skin care due to her diabetes, particularly concerning open wounds.  No new or worsening symptoms related to her pressure sore.  Seeing Dr. Trixie for DM- last A1c 06/19 7.0% with +microalbuminuria- recommended starting lisinopril  to help. Previously stopped d/t low K.  Consider jardiance - patient assistance?  Dr Mona for Cardiac  All ROS negative with exception of what is listed above.   PHYSICAL EXAM Physical Exam Vitals and nursing note reviewed.  Constitutional:      General: She is not in acute distress.    Appearance: She is obese.  HENT:     Head: Normocephalic.  Eyes:     Pupils: Pupils are equal, round, and reactive to light.  Neck:     Vascular: No carotid bruit.  Cardiovascular:     Rate and Rhythm: Normal rate and regular rhythm.     Pulses: Normal pulses.     Heart sounds: Normal heart sounds.  Pulmonary:     Effort: Pulmonary effort is normal.     Breath sounds: Normal breath sounds.  Musculoskeletal:       Back:     Right lower leg: Edema present.     Left lower leg: Edema present.  Skin:    General: Skin is warm and dry.     Capillary Refill: Capillary refill takes less than 2 seconds.  Neurological:     Mental Status: She is alert and oriented to person, place, and time.     Sensory: Sensory deficit present.     Motor: Weakness present.     Coordination: Coordination abnormal.     Gait: Gait abnormal.  Psychiatric:        Mood and Affect: Mood normal.      PLAN Problem List Items Addressed This Visit     Hypertension associated with type 2 diabetes  mellitus (HCC)   Blood pressure is well-controlled. No recent use of nitroglycerin . Continue to monitor.       Relevant Medications   hydrochlorothiazide  (MICROZIDE ) 12.5 MG capsule   lovastatin  (MEVACOR ) 40 MG tablet   Other Relevant Orders   Comprehensive metabolic panel with GFR (Completed)   CBC with Differential/Platelet (Completed)   CKD (chronic kidney disease) stage 3, GFR 30-59 ml/min (HCC)   Chronic kidney disease in the setting of diabetes,  hypertension, hyperlipidemia, and cardiovascular disease.  Strict blood sugar, blood pressure, and dietary restrictions are strongly encouraged to reduce risk of exacerbation or worsening of current symptoms.  Plan: - Labs pending today - Continue current medications and lifestyle management.      Relevant Medications   hydrochlorothiazide  (MICROZIDE ) 12.5 MG capsule   lovastatin  (MEVACOR ) 40 MG tablet   Other Relevant Orders   Comprehensive metabolic panel with GFR (Completed)   CBC with Differential/Platelet (Completed)   Fatty liver disease, nonalcoholic   Chronic fatty liver disease in the setting of hypertension, hyperlipidemia, diabetes, chronic kidney disease, and coronary artery disease.  Diet and exercise recommendations discussed.  Strict avoidance of medications that are harmful on the liver to prevent exacerbation or worsening symptoms.      Relevant Medications   hydrochlorothiazide  (MICROZIDE ) 12.5 MG capsule   lovastatin  (MEVACOR ) 40 MG tablet   Other Relevant Orders   Comprehensive metabolic panel with GFR (Completed)   Hyperlipidemia associated with type 2 diabetes mellitus (HCC)   Chronic hyperlipidemia in the setting of type 2 diabetes, hypertension, obesity, chronic kidney disease, cardiovascular disease, and fatty liver disease.  Currently on lovastatin  for management.  She is also taking Trulicity  for blood sugar control which may also aid in control. Plan: - Continue current medication regimen. - Labs pending today -Review of most recent labs from August showed adequate control. -Will make changes to plan of care as necessary based on lab findings.      Relevant Medications   hydrochlorothiazide  (MICROZIDE ) 12.5 MG capsule   lovastatin  (MEVACOR ) 40 MG tablet   Atherosclerosis of aorta (HCC)   Chronic atherosclerosis detected on imaging in the setting of hypertension, hyperlipidemia, and diabetes.  Strict management with statin therapy recommended as well as  dietary restrictions of cholesterol and fat for optimal control.  Labs pending today.      Relevant Medications   hydrochlorothiazide  (MICROZIDE ) 12.5 MG capsule   lovastatin  (MEVACOR ) 40 MG tablet   Chronic incomplete paraplegia (HCC)   Chronic paraplegia of lower extremities with reliance on wheelchair for mobility.  Recommend frequent repositioning to reduce pressure on buttocks.       Type 2 diabetes mellitus with diabetic nephropathy (HCC) - Primary   Currently on Ozempic  and insulin . Blood sugars are reportedly well-managed. Reports occasional yeast infections, likely due to elevated blood sugars. Management with Endocrinology with recent A1c 7.0%. Continue current regimen and monitoring.       Relevant Medications   hydrochlorothiazide  (MICROZIDE ) 12.5 MG capsule   lovastatin  (MEVACOR ) 40 MG tablet   Other Relevant Orders   Comprehensive metabolic panel with GFR (Completed)   CBC with Differential/Platelet (Completed)   Class 2 severe obesity due to excess calories with serious comorbidity and body mass index (BMI) of 36.0 to 36.9 in adult [facility])   Chronic obesity in the setting of type II diabetes, paraplegia, hypertension, hyperlipidemia, and cardiovascular disease.  Unfortunately the patient is limited in her ability to exercise appropriately.  Recommend dietary management and upper arm exercises to help  with physical activity. Plan: - Labs pending today. - Consider therapy resources such as aqua therapy which may help with physical activity component in the setting of mobility limitations.       Sacral wound   The pressure ulcer has improved significantly with Betadine, and the wound is nearly healed. Unable to get purewick under Medicare. Continued Betadine use is recommended until complete healing. - Continue Betadine application on the pressure ulcer until complete healing        No follow-ups on file.  Cassandra Doing, DNP, AGNP-c

## 2023-09-03 LAB — CBC WITH DIFFERENTIAL/PLATELET
Basophils Absolute: 0 x10E3/uL (ref 0.0–0.2)
Basos: 1 %
EOS (ABSOLUTE): 0.2 x10E3/uL (ref 0.0–0.4)
Eos: 3 %
Hematocrit: 39.9 % (ref 34.0–46.6)
Hemoglobin: 12.4 g/dL (ref 11.1–15.9)
Immature Grans (Abs): 0 x10E3/uL (ref 0.0–0.1)
Immature Granulocytes: 0 %
Lymphocytes Absolute: 2.2 x10E3/uL (ref 0.7–3.1)
Lymphs: 38 %
MCH: 28.6 pg (ref 26.6–33.0)
MCHC: 31.1 g/dL — ABNORMAL LOW (ref 31.5–35.7)
MCV: 92 fL (ref 79–97)
Monocytes Absolute: 0.4 x10E3/uL (ref 0.1–0.9)
Monocytes: 8 %
Neutrophils Absolute: 3 x10E3/uL (ref 1.4–7.0)
Neutrophils: 50 %
Platelets: 302 x10E3/uL (ref 150–450)
RBC: 4.33 x10E6/uL (ref 3.77–5.28)
RDW: 12.7 % (ref 11.7–15.4)
WBC: 5.9 x10E3/uL (ref 3.4–10.8)

## 2023-09-03 LAB — COMPREHENSIVE METABOLIC PANEL WITH GFR
ALT: 31 IU/L (ref 0–32)
AST: 34 IU/L (ref 0–40)
Albumin: 4.2 g/dL (ref 3.8–4.8)
Alkaline Phosphatase: 84 IU/L (ref 44–121)
BUN/Creatinine Ratio: 20 (ref 12–28)
BUN: 19 mg/dL (ref 8–27)
Bilirubin Total: 0.2 mg/dL (ref 0.0–1.2)
CO2: 23 mmol/L (ref 20–29)
Calcium: 9.9 mg/dL (ref 8.7–10.3)
Chloride: 100 mmol/L (ref 96–106)
Creatinine, Ser: 0.93 mg/dL (ref 0.57–1.00)
Globulin, Total: 2.9 g/dL (ref 1.5–4.5)
Glucose: 202 mg/dL — AB (ref 70–99)
Potassium: 4.4 mmol/L (ref 3.5–5.2)
Sodium: 141 mmol/L (ref 134–144)
Total Protein: 7.1 g/dL (ref 6.0–8.5)
eGFR: 64 mL/min/1.73 (ref >59)

## 2023-09-11 ENCOUNTER — Other Ambulatory Visit (HOSPITAL_COMMUNITY): Payer: Self-pay

## 2023-09-12 DIAGNOSIS — M4710 Other spondylosis with myelopathy, site unspecified: Secondary | ICD-10-CM | POA: Diagnosis not present

## 2023-09-12 DIAGNOSIS — R262 Difficulty in walking, not elsewhere classified: Secondary | ICD-10-CM | POA: Diagnosis not present

## 2023-09-12 DIAGNOSIS — G959 Disease of spinal cord, unspecified: Secondary | ICD-10-CM | POA: Diagnosis not present

## 2023-09-12 DIAGNOSIS — L98429 Non-pressure chronic ulcer of back with unspecified severity: Secondary | ICD-10-CM | POA: Diagnosis not present

## 2023-09-17 ENCOUNTER — Ambulatory Visit: Payer: Self-pay | Admitting: Nurse Practitioner

## 2023-09-19 ENCOUNTER — Other Ambulatory Visit: Payer: Self-pay

## 2023-09-21 DIAGNOSIS — Z794 Long term (current) use of insulin: Secondary | ICD-10-CM | POA: Diagnosis not present

## 2023-09-21 DIAGNOSIS — E118 Type 2 diabetes mellitus with unspecified complications: Secondary | ICD-10-CM | POA: Diagnosis not present

## 2023-09-21 DIAGNOSIS — E1165 Type 2 diabetes mellitus with hyperglycemia: Secondary | ICD-10-CM | POA: Diagnosis not present

## 2023-09-22 NOTE — Assessment & Plan Note (Signed)
 The pressure ulcer has improved significantly with Betadine, and the wound is nearly healed. Unable to get purewick under Medicare. Continued Betadine use is recommended until complete healing. - Continue Betadine application on the pressure ulcer until complete healing

## 2023-09-22 NOTE — Assessment & Plan Note (Signed)
Chronic obesity in the setting of type II diabetes, paraplegia, hypertension, hyperlipidemia, and cardiovascular disease.  Unfortunately the patient is limited in her ability to exercise appropriately.  Recommend dietary management and upper arm exercises to help with physical activity. Plan: - Labs pending today. - Consider therapy resources such as aqua therapy which may help with physical activity component in the setting of mobility limitations.

## 2023-09-22 NOTE — Assessment & Plan Note (Signed)
Chronic hyperlipidemia in the setting of type 2 diabetes, hypertension, obesity, chronic kidney disease, cardiovascular disease, and fatty liver disease.  Currently on lovastatin for management.  She is also taking Trulicity for blood sugar control which may also aid in control. Plan: - Continue current medication regimen. - Labs pending today -Review of most recent labs from August showed adequate control. -Will make changes to plan of care as necessary based on lab findings.

## 2023-09-22 NOTE — Assessment & Plan Note (Signed)
 Currently on Ozempic  and insulin . Blood sugars are reportedly well-managed. Reports occasional yeast infections, likely due to elevated blood sugars. Management with Endocrinology with recent A1c 7.0%. Continue current regimen and monitoring.

## 2023-09-22 NOTE — Assessment & Plan Note (Signed)
Chronic atherosclerosis detected on imaging in the setting of hypertension, hyperlipidemia, and diabetes.  Strict management with statin therapy recommended as well as dietary restrictions of cholesterol and fat for optimal control.  Labs pending today.

## 2023-09-22 NOTE — Assessment & Plan Note (Signed)
 Blood pressure is well-controlled. No recent use of nitroglycerin . Continue to monitor.

## 2023-09-22 NOTE — Assessment & Plan Note (Signed)
Chronic fatty liver disease in the setting of hypertension, hyperlipidemia, diabetes, chronic kidney disease, and coronary artery disease.  Diet and exercise recommendations discussed.  Strict avoidance of medications that are harmful on the liver to prevent exacerbation or worsening symptoms.

## 2023-09-22 NOTE — Assessment & Plan Note (Signed)
 Chronic paraplegia of lower extremities with reliance on wheelchair for mobility.  Recommend frequent repositioning to reduce pressure on buttocks.

## 2023-09-22 NOTE — Assessment & Plan Note (Signed)
Chronic kidney disease in the setting of diabetes, hypertension, hyperlipidemia, and cardiovascular disease.  Strict blood sugar, blood pressure, and dietary restrictions are strongly encouraged to reduce risk of exacerbation or worsening of current symptoms.  Plan: - Labs pending today - Continue current medications and lifestyle management.

## 2023-09-26 ENCOUNTER — Other Ambulatory Visit: Payer: Self-pay

## 2023-09-26 ENCOUNTER — Other Ambulatory Visit (HOSPITAL_COMMUNITY): Payer: Self-pay

## 2023-09-26 ENCOUNTER — Other Ambulatory Visit: Payer: Self-pay | Admitting: Internal Medicine

## 2023-09-26 DIAGNOSIS — E1142 Type 2 diabetes mellitus with diabetic polyneuropathy: Secondary | ICD-10-CM

## 2023-09-26 MED ORDER — INSULIN LISPRO (1 UNIT DIAL) 100 UNIT/ML (KWIKPEN)
12.0000 [IU] | PEN_INJECTOR | SUBCUTANEOUS | 3 refills | Status: AC
  Filled 2023-09-26: qty 45, 75d supply, fill #0
  Filled 2023-12-02: qty 45, 75d supply, fill #1

## 2023-09-26 NOTE — Progress Notes (Addendum)
 [facility] - Clinic Note  10/10/2023     CHIEF COMPLAINT Patient presents for Retina Follow Up   HISTORY OF PRESENT ILLNESS: Cassandra Stewart is a 73 y.o. female who presents to the clinic today for:   HPI     Retina Follow Up   Patient presents with  Diabetic Retinopathy.  In both eyes.  Severity is moderate.  Duration of 3 months.  Since onset it is stable.  I, the attending physician,  performed the HPI with the patient and updated documentation appropriately.        Comments   3 month Retina eval. Patient states vision is the same Blood sugar 166      Last edited by Valdemar Rogue, MD on 10/12/2023 10:11 PM.     Pt states vision is stable  Referring physician: Oris Camie BRAVO, NP [address] [place],  [place] [address]  HISTORICAL INFORMATION:   Selected notes from the MEDICAL RECORD NUMBER Referred by V. Lendia, NP-C for DM exam LEE-  Ocular Hx- pseudophakia OU (2016, Dr. Dennise at [facility]) PMH- DM2 on insulin , HTN,     CURRENT MEDICATIONS: No current outpatient medications on file. (Ophthalmic Drugs)   No current facility-administered medications for this visit. (Ophthalmic Drugs)   Current Outpatient Medications (Other)  Medication Sig   ACCU-CHEK GUIDE test strip USE AS DIRECTED TO TEST BLOOD SUGAR FOUR TIMES DAILY   AMBULATORY NON FORMULARY MEDICATION Suction pump, home model, portable or stationary, electric, any type, for use with PureWick urinary collection system.   AMBULATORY NON FORMULARY MEDICATION Pump tubing, used with suction pump, each. [phone]) For use with PureWick urinary collection system. Replace tube every 60 days or sooner with signs of wear.   AMBULATORY NON FORMULARY MEDICATION Collector tubing, used with suction pump, each. [phone]) For use with PureWick urinary collection system. Replace tube every 60 days or sooner with signs of wear.   amLODipine  (NORVASC ) 10 MG tablet Take 1 tablet (10 mg total) by  mouth daily.   aspirin  EC 81 MG tablet Take 81 mg by mouth every evening.   Continuous Blood Gluc Receiver (DEXCOM G6 RECEIVER) DEVI by Does not apply route.   diclofenac  Sodium (VOLTAREN ) 1 % GEL Apply 4 g topically 4 (four) times daily.   dicyclomine  (BENTYL ) 10 MG capsule Take 1 capsule (10 mg total) by mouth 3 (three) times daily before meals.   gabapentin  (NEURONTIN ) 300 MG capsule Take 1 capsule (300 mg total) by mouth 3 (three) times daily.   Glucagon  (GVOKE HYPOPEN  2-PACK) 1 MG/0.2ML SOAJ Use for emergency low blood sugar treatment.   hydrochlorothiazide  (MICROZIDE ) 12.5 MG capsule Take 1 capsule (12.5 mg total) by mouth daily.   insulin  lispro (HUMALOG ) 100 UNIT/ML KwikPen INJECT 12-20 UNITS UNDER THE SKIN 2-3 TIMES DAILY   Insulin  Pen Needle (CAREFINE PEN NEEDLES) 32G X 4 MM MISC Use 4x a day   lovastatin  (MEVACOR ) 40 MG tablet Take 1 tablet (40 mg total) by mouth at bedtime.   metFORMIN  (GLUCOPHAGE -XR) 500 MG 24 hr tablet TAKE 1 TABLET(500 MG) BY MOUTH TWICE DAILY   nitroGLYCERIN  (NITROSTAT ) 0.4 MG SL tablet Place 1 tablet (0.4 mg total) under the tongue every 5 (five) minutes as needed for chest pain.   nystatin  (MYCOSTATIN /NYSTOP ) powder Apply 1 Application topically 3 (three) times daily.   polyethylene glycol (MIRALAX  / GLYCOLAX ) 17 g packet Take 17 g by mouth as needed.   Semaglutide , 2 MG/DOSE, (OZEMPIC , 2 MG/DOSE,) 8 MG/3ML  SOPN Inject 2 mg into the skin once a week.   TOUJEO  SOLOSTAR 300 UNIT/ML Solostar Pen Inject 30-32 Units into the skin at bedtime.   acetaminophen  (TYLENOL ) 650 MG CR tablet Take 650 mg by mouth every 8 (eight) hours as needed for pain.   acetaminophen -codeine  (TYLENOL  #3) 300-30 MG tablet Take 1 tablet by mouth every 8 (eight) hours as needed for moderate pain (pain score 4-6).   AMBULATORY NON FORMULARY MEDICATION Canister, non disposable, used with suction pump, each. [phone]) For use with PureWick urinary collection system. Replace canister every 60 days.    AMBULATORY NON FORMULARY MEDICATION External urinary catheter, disposable, with wicking material, for use with suction pump, per month. [phone]). For use with PureWick urinary collection system. Apply new external catheter every 8-12 hours for urinary collection to allow for wound healing to buttocks.   AMBULATORY NON FORMULARY MEDICATION Hydrocolloid dressing, for sacral pressure wound, coverage for 2 x 2 area, any brand. Change dressing every 3 days or sooner if soiled. For continuous use until discontinued.   Cholecalciferol (VITAMIN D  PO) Take 1,000 mg by mouth daily.    Cyanocobalamin  (VITAMIN B 12 PO) Take 5,000 mg by mouth daily.   No current facility-administered medications for this visit. (Other)   REVIEW OF SYSTEMS: ROS   Positive for: Musculoskeletal, Endocrine, Eyes Negative for: Constitutional, Gastrointestinal, Neurological, Skin, Genitourinary, HENT, Cardiovascular, Respiratory, Psychiatric, Allergic/Imm, Heme/Lymph Last edited by German Olam BRAVO, COT on 10/10/2023 12:39 PM.      ALLERGIES Allergies  Allergen Reactions   Tramadol  Nausea And Vomiting    Severe N/V- with Tramadol    Jardiance  [Empagliflozin ]    Other     PT PREFERS TO NOT HAVE ANY NARCOTIC MEDICATIONS   Chlorhexidine Rash    Burning after using CHG wipes-used for surgery   Oxycodone  Hcl     Other reaction(s): Hallucination Marked hallucinations and palpitations following dose of 10mg  on 04/30/2015    PAST MEDICAL HISTORY Past Medical History:  Diagnosis Date   Abdominal pain, chronic, left lower quadrant 08/25/2018   Elevated LFTs    Family history of heart disease 12/04/2018   Fatty liver    on US .    Hyperlipemia    Hypertension    Inguinal hernia    2020 CT   Morbid obesity (HCC) 07/04/2015   Numbness and tingling in right hand 03/01/2021   Palpitation 03/08/2021   Personal history of noncompliance with medical treatment, presenting hazards to health 01/29/2017   Stage 3b chronic kidney  disease (HCC)    Thoracic spondylosis with myelopathy    Umbilical hernia    2020 CT   Uncontrolled type 2 diabetes with neuropathy    Weakness of left leg 07/04/2015   Wheelchair bound    paraplegia of Leg - Left   Past Surgical History:  Procedure Laterality Date   APPENDECTOMY  1969   BREAST BIOPSY Right    CATARACT EXTRACTION     May 78983   LUMBAR DISC SURGERY     LUMBAR LAMINECTOMY/DECOMPRESSION MICRODISCECTOMY N/A 07/06/2015   Procedure: Thoracic ten- eleven laminectomy for spinal canal decompression;  Surgeon: Rockey Peru, MD;  Location: [facility];  Service: Orthopedics;  Laterality: N/A;   FAMILY HISTORY Family History  Problem Relation Age of Onset   Diabetes Mother    Heart disease Mother    Pancreatic cancer Sister    Kidney disease Brother        Two brothers on ESRD   Amblyopia Neg Hx  Blindness Neg Hx    Cataracts Neg Hx    Glaucoma Neg Hx    Macular degeneration Neg Hx    Retinal detachment Neg Hx    Strabismus Neg Hx    Retinitis pigmentosa Neg Hx    Colon cancer Neg Hx    Esophageal cancer Neg Hx    SOCIAL HISTORY Social History   Tobacco Use   Smoking status: Never   Smokeless tobacco: Never  Vaping Use   Vaping status: Never Used  Substance Use Topics   Alcohol use: No   Drug use: No       OPHTHALMIC EXAM:  Base Eye Exam     Visual Acuity (Snellen - Linear)       Right Left   Dist [place] 20/25 +2 20/40 +1   Dist ph [place] 20/NI 20/25         Tonometry (Tonopen, 12:41 PM)       Right Left   Pressure 20 17         Pupils       Dark Light Shape React APD   Right 3 2 Round Brisk None   Left 3 2 Round Brisk None         Visual Fields (Counting fingers)       Left Right    Full Full         Extraocular Movement       Right Left    Full, Ortho Full, Ortho         Neuro/Psych     Oriented x3: Yes   Mood/Affect: Normal         Dilation     Both eyes: 1.0% Mydriacyl, 2.5% Phenylephrine  @ 12:41 PM            Slit Lamp and Fundus Exam     External Exam       Right Left   External Normal Normal         Slit Lamp Exam       Right Left   Lids/Lashes Dermatochalasis - upper lid Dermatochalasis - upper lid   Conjunctiva/Sclera mild melanosis Mild Melanosis   Cornea mild arcus, well healed cataract wound, trace tear film debris, 1+ fine Punctate epithelial erosions arcus, 1-2+ Punctate epithelial erosions, well healed cataract wound, tear film debris   Anterior Chamber deep and clear Deep and quiet   Iris Round and well dilated, No NVI Round and dilated, No NVI   Lens PC IOL in good postion, trace inferior Posterior capsular opacification PC IOL in good position with open PC   Anterior Vitreous Vitreous syneresis, Posterior vitreous detachment Vitreous syneresis, Posterior vitreous detachment         Fundus Exam       Right Left   Disc Pink and Sharp Mildly Tilted disc, Pink and Sharp   C/D Ratio 0.65 0.6   Macula Flat, Good foveal reflex, mild RPE mottling, rare MA / DBH inferior mac; no frank edema Flat, Good foveal reflex, mild RPE mottling, rare MA, +cystic changes / edema temporal fovea and macula--slightly improved   Vessels attenuated, Tortuous attenuated, mild tortuosity   Periphery Attached; scattered MA/DBH mostly posterior Attached; rare scattered MA/DBH mostly posterior           IMAGING AND PROCEDURES  Imaging and Procedures for 06/09/17  OCT, Retina - OU - Both Eyes       Right Eye Quality was good. Central Foveal Thickness: 267. Progression has improved. Findings include  normal foveal contour, no SRF, myopic contour, intraretinal hyper-reflective material, intraretinal fluid (Tr cystic changes / IRHM inferior macula--slightly improved).   Left Eye Quality was good. Central Foveal Thickness: 316. Progression has improved. Findings include no SRF, abnormal foveal contour, myopic contour, intraretinal hyper-reflective material, intraretinal fluid (Interval  improvement in IRF temporal fovea).   Notes *Images captured and stored on drive  Diagnosis / Impression:  NFP, No IRF/SRF OU OD: Tr cystic changes / IRHM inferior macula--slightly improved OS: +DME -- interval improvement in IRF temporal fovea  Clinical management:  See below  Abbreviations: NFP - Normal foveal profile. CME - cystoid macular edema. PED - pigment epithelial detachment. IRF - intraretinal fluid. SRF - subretinal fluid. EZ - ellipsoid zone. ERM - epiretinal membrane. ORA - outer retinal atrophy. ORT - outer retinal tubulation. SRHM - subretinal hyper-reflective material             ASSESSMENT/PLAN:    ICD-10-CM   1. Both eyes affected by mild nonproliferative diabetic retinopathy with macular edema, associated with type 2 diabetes mellitus (HCC)  E11.3213 OCT, Retina - OU - Both Eyes    2. Long term (current) use of oral hypoglycemic drugs  Z79.84     3. Current use of insulin  (HCC)  Z79.4     4. 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 Essential hypertension  I10     6. Hypertensive retinopathy of both eyes  H35.033     7. Posterior vitreous detachment of both eyes  H43.813     8. Pseudophakia of both eyes  Z96.1     9. PCO (posterior capsular opacification), left  H26.492       1-4. DM2 w/ mild non-proliferative diabetic retinopathy, OU  - last A1c 7.0 on 06.1.25, 6.8 on 02.14.25  - exam with scattered MAs and improved CWSs OU  - OCT shows OD: Tr cystic changes / IRHM inferior macula--slightly improved ; OS: +DME--Interval improvement in IRF temporal fovea at 3 months  - BCVA decreased to 20/25 OU from 20/20 OU  - f/u 3 months -- repeat DFE, OCT  5,6. Hypertensive retinopathy OU - mild  - discussed importance of tight BP control  - monitor   7. PVD / vitreous syneresis OU  - asymptomatic  - Discussed findings and prognosis  - No RT or RD on 360 peripheral exam  - Reviewed s/s of RT/RD  - strict return precautions  for any such RT/RD symptoms  8. Pseudophakia OU  - s/p CE/IOL in 2016, [facility], Dr. Dennise  - beautiful surgeries, doing well  - monitor   9. PCO OU  - s/p YAG OS (07.14.21)  - good opening, BCVA improved to 20/20 OS  - monitor    Ophthalmic Meds Ordered this visit:  No orders of the defined types were placed in this encounter.    Return in about 3 months (around 01/10/2024) for NPDR OU, DFE, OCT.  There are no Patient Instructions on file for this visit.   Explained the diagnoses, plan, and follow up with the patient and they expressed understanding.  Patient expressed understanding of the importance of proper follow up care.   This document serves as a record of services personally performed by Redell JUDITHANN Hans, MD, PhD. It was created on their behalf by Avelina Pereyra, COA an ophthalmic technician. The creation of this record is the provider's dictation and/or activities during the visit.   Electronically signed by:  Avelina GORMAN Pereyra, COT  10/12/23  10:13 PM   This document serves as a record of services personally performed by Redell JUDITHANN Hans, MD, PhD. It was created on their behalf by Almetta Pesa, an ophthalmic technician. The creation of this record is the provider's dictation and/or activities during the visit.    Electronically signed by: Almetta Pesa, OA, 10/12/23  10:13 PM  Redell JUDITHANN Hans, M.D., Ph.D. Diseases & Surgery of the Retina and Vitreous [facility]  I have reviewed the above documentation for accuracy and completeness, and I agree with the above. Redell JUDITHANN Hans, M.D., Ph.D. 10/12/23 10:14 PM   Abbreviations: M myopia (nearsighted); A astigmatism; H hyperopia (farsighted); P presbyopia; Mrx spectacle prescription;  CTL contact lenses; OD right eye; OS left eye; OU both eyes  XT exotropia; ET esotropia; PEK punctate epithelial keratitis; PEE punctate epithelial erosions; DES dry eye syndrome; MGD meibomian gland  dysfunction; ATs artificial tears; PFAT's preservative free artificial tears; NSC nuclear sclerotic cataract; PSC posterior subcapsular cataract; ERM epi-retinal membrane; PVD posterior vitreous detachment; RD retinal detachment; DM diabetes mellitus; DR diabetic retinopathy; NPDR non-proliferative diabetic retinopathy; PDR proliferative diabetic retinopathy; CSME clinically significant macular edema; DME diabetic macular edema; dbh dot blot hemorrhages; CWS cotton wool spot; POAG primary open angle glaucoma; C/D cup-to-disc ratio; HVF humphrey visual field; GVF goldmann visual field; OCT optical coherence tomography; IOP intraocular pressure; BRVO Branch retinal vein occlusion; CRVO central retinal vein occlusion; CRAO central retinal artery occlusion; BRAO branch retinal artery occlusion; RT retinal tear; SB scleral buckle; PPV pars plana vitrectomy; VH Vitreous hemorrhage; PRP panretinal laser photocoagulation; IVK intravitreal kenalog; VMT vitreomacular traction; MH Macular hole;  NVD neovascularization of the disc; NVE neovascularization elsewhere; AREDS age related eye disease study; ARMD age related macular degeneration; POAG primary open angle glaucoma; EBMD epithelial/anterior basement membrane dystrophy; ACIOL anterior chamber intraocular lens; IOL intraocular lens; PCIOL posterior chamber intraocular lens; Phaco/IOL phacoemulsification with intraocular lens placement; PRK photorefractive keratectomy; LASIK laser assisted in situ keratomileusis; HTN hypertension; DM diabetes mellitus; COPD chronic obstructive pulmonary disease

## 2023-10-10 ENCOUNTER — Ambulatory Visit (INDEPENDENT_AMBULATORY_CARE_PROVIDER_SITE_OTHER): Admitting: Ophthalmology

## 2023-10-10 ENCOUNTER — Encounter (INDEPENDENT_AMBULATORY_CARE_PROVIDER_SITE_OTHER): Payer: Self-pay | Admitting: Ophthalmology

## 2023-10-10 DIAGNOSIS — E113213 Type 2 diabetes mellitus with mild nonproliferative diabetic retinopathy with macular edema, bilateral: Secondary | ICD-10-CM | POA: Diagnosis not present

## 2023-10-10 DIAGNOSIS — Z7984 Long term (current) use of oral hypoglycemic drugs: Secondary | ICD-10-CM

## 2023-10-10 DIAGNOSIS — Z961 Presence of intraocular lens: Secondary | ICD-10-CM

## 2023-10-10 DIAGNOSIS — Z794 Long term (current) use of insulin: Secondary | ICD-10-CM

## 2023-10-10 DIAGNOSIS — H35033 Hypertensive retinopathy, bilateral: Secondary | ICD-10-CM | POA: Diagnosis not present

## 2023-10-10 DIAGNOSIS — H26492 Other secondary cataract, left eye: Secondary | ICD-10-CM | POA: Diagnosis not present

## 2023-10-10 DIAGNOSIS — I10 Essential (primary) hypertension: Secondary | ICD-10-CM

## 2023-10-10 DIAGNOSIS — E11319 Type 2 diabetes mellitus with unspecified diabetic retinopathy without macular edema: Secondary | ICD-10-CM

## 2023-10-10 DIAGNOSIS — H35049 Retinal micro-aneurysms, unspecified, unspecified eye: Secondary | ICD-10-CM | POA: Diagnosis not present

## 2023-10-10 DIAGNOSIS — H43813 Vitreous degeneration, bilateral: Secondary | ICD-10-CM

## 2023-10-12 ENCOUNTER — Encounter (INDEPENDENT_AMBULATORY_CARE_PROVIDER_SITE_OTHER): Payer: Self-pay | Admitting: Ophthalmology

## 2023-10-21 DIAGNOSIS — Z794 Long term (current) use of insulin: Secondary | ICD-10-CM | POA: Diagnosis not present

## 2023-10-21 DIAGNOSIS — E118 Type 2 diabetes mellitus with unspecified complications: Secondary | ICD-10-CM | POA: Diagnosis not present

## 2023-10-21 DIAGNOSIS — E1165 Type 2 diabetes mellitus with hyperglycemia: Secondary | ICD-10-CM | POA: Diagnosis not present

## 2023-11-12 DIAGNOSIS — G959 Disease of spinal cord, unspecified: Secondary | ICD-10-CM | POA: Diagnosis not present

## 2023-11-12 DIAGNOSIS — R262 Difficulty in walking, not elsewhere classified: Secondary | ICD-10-CM | POA: Diagnosis not present

## 2023-11-12 DIAGNOSIS — M4710 Other spondylosis with myelopathy, site unspecified: Secondary | ICD-10-CM | POA: Diagnosis not present

## 2023-11-20 DIAGNOSIS — E1165 Type 2 diabetes mellitus with hyperglycemia: Secondary | ICD-10-CM | POA: Diagnosis not present

## 2023-11-20 DIAGNOSIS — Z794 Long term (current) use of insulin: Secondary | ICD-10-CM | POA: Diagnosis not present

## 2023-11-20 DIAGNOSIS — E118 Type 2 diabetes mellitus with unspecified complications: Secondary | ICD-10-CM | POA: Diagnosis not present

## 2023-12-02 ENCOUNTER — Other Ambulatory Visit: Payer: Self-pay

## 2023-12-02 ENCOUNTER — Other Ambulatory Visit (HOSPITAL_COMMUNITY): Payer: Self-pay

## 2023-12-17 ENCOUNTER — Ambulatory Visit: Admitting: Internal Medicine

## 2023-12-17 NOTE — Progress Notes (Deleted)
 Patient ID: Cassandra Stewart, female   DOB: 1950-05-15, 73 y.o.   MRN: [id number]   HPI: Cassandra Stewart is a 73 y.o.-year-old female, returning for follow-up for DM2, dx in 1992, insulin -dependent since 2006, uncontrolled, with complications (CKD stage 3, PN, DR).  Last visit 4 months ago  Interim history: No increased urination, blurry vision, nausea.   She has a stage III pressure ulcer on her buttock, followed by PCP.  This is getting better. Before last visit, she relaxed her diet including more pizza, [place] foods, and sugars have been higher.   Reviewed HbA1c levels: Lab Results  Component Value Date   HGBA1C 7.0 (A) 08/14/2023   HGBA1C 6.8 (A) 04/11/2023   HGBA1C 8.2 (H) 01/13/2023   HGBA1C 7.5 (A) 08/21/2022   HGBA1C 7.5 (A) 04/04/2022   HGBA1C 7.3 (A) 11/28/2021   HGBA1C 7.7 (A) 07/19/2021   HGBA1C 8.5 (A) 04/09/2021   HGBA1C 7.1 (A) 12/06/2020   HGBA1C 8.5 (A) 08/04/2020   HGBA1C 9.9 (A) 04/06/2020   HGBA1C 7.4 (A) 12/02/2019   HGBA1C 9.3 (A) 07/29/2019   HGBA1C 9.6 (A) 03/30/2019   HGBA1C 9.8 (A) 12/28/2018   HGBA1C 10.7 (A) 07/13/2018   HGBA1C 6.8 (A) 03/16/2018   HGBA1C 6.1 (A) 11/12/2017   HGBA1C 9.0 07/11/2017   HGBA1C 9.0 04/14/2017  03/16/2018: HbA1c calculated from fructosamine 6.28%  Pt was on a regimen of: - Metformin  500 mg 1x a day with dinner.  She had diarrhea with a higher dose. - Toujeo  45 units in am - Humalog  18 units 2-3x a day, before meals Tried: Actos, Lantus .  Now on: - Metformin  ER 500 mg 1-2x a day with meals. - Ozempic  0.5 >> 1 >> 2 >> 1 >> 2 mg weekly - Toujeo  35 >> ...28 >> 30 units at night (decreased by PCP) - Humalog  7-12 units before meals and 5 >> ... 12-20 units before the meals (higher doses with lunch), and 7 units before the snack at night  She was previously on Trulicity .  She checks her blood sugars >4 times a day with her Dexcom CGM (with receiver):  Previously:  Prev.:   Lowest sugar was 40 >> .SABRA.60s; she  has hypoglycemia awareness in the 80s. Highest sugar was 393 (cereal: fruity pebbles) >> .SABRA.283 >> 300s  Glucometer: Freestyle  Pt's meals are: - Breakfast/brunch: egg, bacon, toast >> no b'fast  - Lunch: snack mostly (no Humalog ) >> Meals on Wheels - Dinner: chicken/fish + vegetables >> moved before 7 pm - Snacks: Belvita cracker if hungry  -+ Stage III CKD; last BUN/creatinine:  Lab Results  Component Value Date   BUN 19 09/02/2023   BUN 15 01/13/2023   CREATININE 0.93 09/02/2023   CREATININE 1.09 (H) 01/13/2023   Lab Results  Component Value Date   MICRALBCREAT 74 (H) 08/14/2023   MICRALBCREAT 16 07/25/2016   MICRALBCREAT 57 (H) 10/25/2015   Lab Results  Component Value Date   GFRAA 54 (L) 03/02/2020   GFRAA 59 (L) 08/31/2019   GFRAA 58 (L) 03/24/2019   GFRAA 59 (L) 03/12/2019   GFRAA 55 (L) 03/08/2019  Previously on lisinopril  10, but stopped due to hyperkalemia.   -+ HL; last set of lipids: Lab Results  Component Value Date   CHOL 144 04/11/2023   HDL 66 04/11/2023   LDLCALC 56 04/11/2023   TRIG 137 04/11/2023   CHOLHDL 2.2 04/11/2023  On lovastatin  40 mg daily.  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 Valdemar).  She has history of cataract surgery OU.  -+ Numbness and tingling in her fingers-on Neurontin  300 mg 3x a day per PCP.  Latest foot exam 04/11/2023.  Low vitamin B12:  Reviewed B12 levels: Lab Results  Component Value Date   VITAMINB12 1,032 01/13/2023   VITAMINB12 >1500 (H) 11/28/2021   VITAMINB12 >1550 (H) 12/06/2020   VITAMINB12 >1526 (H) 07/29/2019   VITAMINB12 >1500 (H) 03/30/2019   VITAMINB12 >1500 (H) 12/28/2018   VITAMINB12 261 03/16/2018   VITAMINB12 300 04/14/2017   VITAMINB12 478 10/25/2015  05/01/2016: Vit B12 248.  We initially started 5000 mcg B12 daily, which was then decreased to 2500 mcg daily and, then to 1000 mcg daily >> every other day >> 2x a week - 11/2020 >> decreased to once a week  11/2021.  She continues to be in a wheelchair as her left leg is very weak-believed to be from a herniated intervertebral disc.  She had surgery for this in 2016 but the strength did not improve. She was in the emergency room on 10/24/2020 for R wrist pain and swelling.  She also sees Dr. Elta.  No fracture. Has a brace. Her ulnar nerve is affected by using her home wheelchair. She was in the emergency room 11/28/2020 for chest pain.  Troponins were negative.  D-dimer was still elevated. She continues to have neck and back pain.   She has an extensive FH of heart disease - women in her family. Daughter died after her heart stopped.  ROS: + See HPI  I reviewed pt's medications, allergies, PMH, social hx, family hx, and changes were documented in the history of present illness. Otherwise, unchanged from my initial visit note.  Past Medical History:  Diagnosis Date   Abdominal pain, chronic, left lower quadrant 08/25/2018   Elevated LFTs    Family history of heart disease 12/04/2018   Fatty liver    on US .    Hyperlipemia    Hypertension    Inguinal hernia    2020 CT   Morbid obesity (HCC) 07/04/2015   Numbness and tingling in right hand 03/01/2021   Palpitation 03/08/2021   Personal history of noncompliance with medical treatment, presenting hazards to health 01/29/2017   Stage 3b chronic kidney disease (HCC)    Thoracic spondylosis with myelopathy    Umbilical hernia    2020 CT   Uncontrolled type 2 diabetes with neuropathy    Weakness of left leg 07/04/2015   Wheelchair bound    paraplegia of Leg - Left   Past Surgical History:  Procedure Laterality Date   APPENDECTOMY  1969   BREAST BIOPSY Right    CATARACT EXTRACTION     May 78983   LUMBAR DISC SURGERY     LUMBAR LAMINECTOMY/DECOMPRESSION MICRODISCECTOMY N/A 07/06/2015   Procedure: Thoracic ten- eleven laminectomy for spinal canal decompression;  Surgeon: Rockey Peru, MD;  Location: [facility];  Service: Orthopedics;   Laterality: N/A;   Social History   Social History   Marital status: Divorced    Spouse name: N/A   Number of children: 0   Occupational History   None   Social History Main Topics   Smoking status: Never Smoker   Smokeless tobacco: Never Used   Alcohol use No   Drug use: No   Current Outpatient Medications on File Prior to Visit  Medication Sig Dispense Refill   ACCU-CHEK GUIDE test strip USE AS DIRECTED TO TEST BLOOD SUGAR FOUR TIMES  DAILY 400 strip 1   acetaminophen  (TYLENOL ) 650 MG CR tablet Take 650 mg by mouth every 8 (eight) hours as needed for pain.     acetaminophen -codeine  (TYLENOL  #3) 300-30 MG tablet Take 1 tablet by mouth every 8 (eight) hours as needed for moderate pain (pain score 4-6). 21 tablet 0   AMBULATORY NON FORMULARY MEDICATION Suction pump, home model, portable or stationary, electric, any type, for use with PureWick urinary collection system. 1 each 0   AMBULATORY NON FORMULARY MEDICATION Pump tubing, used with suction pump, each. [phone]) For use with PureWick urinary collection system. Replace tube every 60 days or sooner with signs of wear. 1 each 11   AMBULATORY NON FORMULARY MEDICATION Collector tubing, used with suction pump, each. [phone]) For use with PureWick urinary collection system. Replace tube every 60 days or sooner with signs of wear. 1 each 11   AMBULATORY NON FORMULARY MEDICATION Canister, non disposable, used with suction pump, each. [phone]) For use with PureWick urinary collection system. Replace canister every 60 days. 1 each 11   AMBULATORY NON FORMULARY MEDICATION External urinary catheter, disposable, with wicking material, for use with suction pump, per month. [phone]). For use with PureWick urinary collection system. Apply new external catheter every 8-12 hours for urinary collection to allow for wound healing to buttocks. 90 each 11   AMBULATORY NON FORMULARY MEDICATION Hydrocolloid dressing, for sacral pressure wound, coverage for 2 x 2  area, any brand. Change dressing every 3 days or sooner if soiled. For continuous use until discontinued. 30 each 11   amLODipine  (NORVASC ) 10 MG tablet Take 1 tablet (10 mg total) by mouth daily. 90 tablet 3   aspirin  EC 81 MG tablet Take 81 mg by mouth every evening.     Cholecalciferol (VITAMIN D  PO) Take 1,000 mg by mouth daily.      Continuous Blood Gluc Receiver (DEXCOM G6 RECEIVER) DEVI by Does not apply route.     Cyanocobalamin  (VITAMIN B 12 PO) Take 5,000 mg by mouth daily.     diclofenac  Sodium (VOLTAREN ) 1 % GEL Apply 4 g topically 4 (four) times daily. 100 g 0   dicyclomine  (BENTYL ) 10 MG capsule Take 1 capsule (10 mg total) by mouth 3 (three) times daily before meals. 270 capsule 3   gabapentin  (NEURONTIN ) 300 MG capsule Take 1 capsule (300 mg total) by mouth 3 (three) times daily. 270 capsule 3   Glucagon  (GVOKE HYPOPEN  2-PACK) 1 MG/0.2ML SOAJ Use for emergency low blood sugar treatment. 0.4 mL 3   hydrochlorothiazide  (MICROZIDE ) 12.5 MG capsule Take 1 capsule (12.5 mg total) by mouth daily. 90 capsule 1   insulin  lispro (HUMALOG ) 100 UNIT/ML KwikPen INJECT 12-20 UNITS UNDER THE SKIN 2-3 TIMES DAILY 45 mL 3   Insulin  Pen Needle (CAREFINE PEN NEEDLES) 32G X 4 MM MISC Use 4x a day 300 each 3   lovastatin  (MEVACOR ) 40 MG tablet Take 1 tablet (40 mg total) by mouth at bedtime. 90 tablet 1   metFORMIN  (GLUCOPHAGE -XR) 500 MG 24 hr tablet TAKE 1 TABLET(500 MG) BY MOUTH TWICE DAILY 180 tablet 1   nitroGLYCERIN  (NITROSTAT ) 0.4 MG SL tablet Place 1 tablet (0.4 mg total) under the tongue every 5 (five) minutes as needed for chest pain. 25 tablet 3   nystatin  (MYCOSTATIN /NYSTOP ) powder Apply 1 Application topically 3 (three) times daily. 60 g 3   polyethylene glycol (MIRALAX  / GLYCOLAX ) 17 g packet Take 17 g by mouth as needed.     Semaglutide , 2  MG/DOSE, (OZEMPIC , 2 MG/DOSE,) 8 MG/3ML SOPN Inject 2 mg into the skin once a week. 9 mL 3   TOUJEO  SOLOSTAR 300 UNIT/ML Solostar Pen Inject 30-32  Units into the skin at bedtime. 12 mL 3   No current facility-administered medications on file prior to visit.   Allergies  Allergen Reactions   Tramadol  Nausea And Vomiting    Severe N/V- with Tramadol    Jardiance  [Empagliflozin ]    Other     PT PREFERS TO NOT HAVE ANY NARCOTIC MEDICATIONS   Chlorhexidine Rash    Burning after using CHG wipes-used for surgery   Oxycodone  Hcl     Other reaction(s): Hallucination Marked hallucinations and palpitations following dose of 10mg  on 04/30/2015    Family History  Problem Relation Age of Onset   Diabetes Mother    Heart disease Mother    Pancreatic cancer Sister    Kidney disease Brother        Two brothers on ESRD   Amblyopia Neg Hx    Blindness Neg Hx    Cataracts Neg Hx    Glaucoma Neg Hx    Macular degeneration Neg Hx    Retinal detachment Neg Hx    Strabismus Neg Hx    Retinitis pigmentosa Neg Hx    Colon cancer Neg Hx    Esophageal cancer Neg Hx    Pt has FH of DM in M, MGM, PGM, M aunt, uncles.  PE: There were no vitals taken for this visit. Wt Readings from Last 3 Encounters:  09/02/23 204 lb (92.5 kg)  08/14/23 207 lb 6.4 oz (94.1 kg)  04/04/23 218 lb (98.9 kg)   Constitutional: overweight, in NAD, in wheelchair Eyes: EOMI, no exophthalmos ENT: no thyromegaly, no cervical lymphadenopathy Cardiovascular: RRR, No MRG, + L ankle edema - pitting (after fall 2017) Respiratory: CTA B Musculoskeletal: + paraplegia Skin: no rashes Neurological: no tremor with outstretched hands  ASSESSMENT: 1. DM2, insulin -dependent, uncontrolled, with complications - CKD stage 3 - PN - DR  - Freestyle libre CGM was not covered by the insurance  2. Low B12  3. HL  PLAN:  1. Patient with longstanding, uncontrolled, type 2 diabetes, on metformin , basal-bolus insulin  regimen, and GLP-1 receptor agonist, increased at last visit.  At that time, HbA1c was slightly higher, at 7.0%, increased from 6.8%.  Sugars appeared to be worse,  increasing initially after breakfast and then more so after lunch, with a peak around 5 PM.  She had another hyperglycemic peak around 10 PM.  Upon questioning, she relaxed her diet, having more dietary indiscretions.  She was planning to improve these, but I also suggested to go back to the higher dose of Ozempic , 2 mg weekly. CGM interpretation: -At today's visit, we reviewed her CGM downloads: It appears that *** of values are in target range (goal >70%), while *** are higher than 180 (goal <25%), and *** are lower than 70 (goal <4%).  The calculated average blood sugar is ***.  The projected HbA1c for the next 3 months (GMI) is ***. -Reviewing the CGM trends, *** - I suggested to:  Patient Instructions  Please continue: - Metformin  ER 500 mg 1-2x a day with meals - Toujeo  30 units at night - Humalog  12-20 units before the meals (higher doses with lunch), and 7 units before the snack at night  - Ozempic  2 mg weekly  Please return in 4 months.   - we checked her HbA1c: 7%  - advised to check sugars  at different times of the day - 4x a day, rotating check times - advised for yearly eye exams >> she is UTD - she has elevated ACR.  She was previously taken off ACE inhibitors and I was reticent to add this back due to her borderline high potassium. - return to clinic in 4 months  2. Low B12 -Patient has a history of low B12 for which we started supplementation with p.o. vitamin B12 -We had to reduce the dose subsequently as her B12 was elevated.  She takes 1000 mcg B12 weekly. - Latest B12 level was normal. Lab Results  Component Value Date   VITAMINB12 1,032 01/13/2023   VITAMINB12 >1500 (H) 11/28/2021   3. HL - Reviewed latest lipid panel from 03/2023: Fractions at goal: Lab Results  Component Value Date   CHOL 144 04/11/2023   HDL 66 04/11/2023   LDLCALC 56 04/11/2023   TRIG 137 04/11/2023   CHOLHDL 2.2 04/11/2023  -She continues on Mevacor  40 mg daily without side  effects  Lela Fendt, MD PhD [facility] Endocrin[facility]

## 2023-12-18 ENCOUNTER — Telehealth: Payer: Self-pay

## 2023-12-18 NOTE — Telephone Encounter (Signed)
 Copied from CRM #[id number]. Topic: General - Other >> Dec 18, 2023  3:36 PM Myrick T wrote: Reason for CRM: patient daughter Stoney Beal dropped off paperwork that needs to be faxed to [facility] but she left he wrong fax#. The correct fax#[phone].

## 2023-12-19 ENCOUNTER — Other Ambulatory Visit: Payer: Self-pay

## 2023-12-19 ENCOUNTER — Other Ambulatory Visit

## 2023-12-19 ENCOUNTER — Ambulatory Visit: Admitting: Internal Medicine

## 2023-12-19 ENCOUNTER — Encounter: Payer: Self-pay | Admitting: Internal Medicine

## 2023-12-19 ENCOUNTER — Other Ambulatory Visit (HOSPITAL_COMMUNITY): Payer: Self-pay

## 2023-12-19 VITALS — BP 120/72 | HR 72

## 2023-12-19 DIAGNOSIS — Z7984 Long term (current) use of oral hypoglycemic drugs: Secondary | ICD-10-CM

## 2023-12-19 DIAGNOSIS — E1165 Type 2 diabetes mellitus with hyperglycemia: Secondary | ICD-10-CM

## 2023-12-19 DIAGNOSIS — E782 Mixed hyperlipidemia: Secondary | ICD-10-CM

## 2023-12-19 DIAGNOSIS — E1142 Type 2 diabetes mellitus with diabetic polyneuropathy: Secondary | ICD-10-CM

## 2023-12-19 DIAGNOSIS — E538 Deficiency of other specified B group vitamins: Secondary | ICD-10-CM | POA: Diagnosis not present

## 2023-12-19 DIAGNOSIS — Z7985 Long-term (current) use of injectable non-insulin antidiabetic drugs: Secondary | ICD-10-CM | POA: Diagnosis not present

## 2023-12-19 DIAGNOSIS — Z794 Long term (current) use of insulin: Secondary | ICD-10-CM | POA: Diagnosis not present

## 2023-12-19 LAB — POCT GLYCOSYLATED HEMOGLOBIN (HGB A1C): Hemoglobin A1C: 6.5 % — AB (ref 4.0–5.6)

## 2023-12-19 MED ORDER — METFORMIN HCL ER 500 MG PO TB24
500.0000 mg | ORAL_TABLET | Freq: Two times a day (BID) | ORAL | 3 refills | Status: AC
  Filled 2023-12-19 (×3): qty 180, 90d supply, fill #0

## 2023-12-19 NOTE — Progress Notes (Addendum)
 Patient ID: Cassandra Stewart, female   DOB: 1950-10-11, 73 y.o.   MRN: [id number]  This note was precharted 12/17/2023.  HPI: Cassandra Stewart is a 73 y.o.-year-old female, returning for follow-up for DM2, dx in 1992, insulin -dependent since 2006, uncontrolled, with complications (CKD stage 3, PN, DR).  Last visit 4 months ago  Interim history: No increased urination, blurry vision, nausea.   She has a stage III pressure ulcer on her buttock, followed by PCP.  This is getting better. Before last visit, she relaxed her diet including more pizza, Chinese foods, and sugars have been higher.  She started to improve her diet since last visit, however, she was out of Ozempic  for 1.5 weeks recently and she ate more >> sugars are higher  >> now restarted on Ozempic  with improvement in blood sugars.   Reviewed HbA1c levels: Lab Results  Component Value Date   HGBA1C 7.0 (A) 08/14/2023   HGBA1C 6.8 (A) 04/11/2023   HGBA1C 8.2 (H) 01/13/2023   HGBA1C 7.5 (A) 08/21/2022   HGBA1C 7.5 (A) 04/04/2022   HGBA1C 7.3 (A) 11/28/2021   HGBA1C 7.7 (A) 07/19/2021   HGBA1C 8.5 (A) 04/09/2021   HGBA1C 7.1 (A) 12/06/2020   HGBA1C 8.5 (A) 08/04/2020   HGBA1C 9.9 (A) 04/06/2020   HGBA1C 7.4 (A) 12/02/2019   HGBA1C 9.3 (A) 07/29/2019   HGBA1C 9.6 (A) 03/30/2019   HGBA1C 9.8 (A) 12/28/2018   HGBA1C 10.7 (A) 07/13/2018   HGBA1C 6.8 (A) 03/16/2018   HGBA1C 6.1 (A) 11/12/2017   HGBA1C 9.0 07/11/2017   HGBA1C 9.0 04/14/2017  03/16/2018: HbA1c calculated from fructosamine 6.28%  Pt was on a regimen of: - Metformin  500 mg 1x a day with dinner.  She had diarrhea with a higher dose. - Toujeo  45 units in am - Humalog  18 units 2-3x a day, before meals Tried: Actos, Lantus .  Now on: - Metformin  ER 500 mg 1-2x a day with meals. - Ozempic  0.5 >> 1 >> 2 >> 1 >> 2 mg weekly - Toujeo  35 >> ...28 >> 30 >> 28 units at night (decreased b/c of lows) - Humalog  7-12 units before meals and 5 >> ... 12-20 units before the  meals (higher doses with lunch), and 7 units before the snack at night >> 14 units 3x a day She was previously on Trulicity .  She checks her blood sugars >4 times a day with her Dexcom CGM (with receiver):    Previously:  Prev.:   Lowest sugar was 40 >> .SABRA.60s; she has hypoglycemia awareness in the 80s. Highest sugar was 393 (cereal: fruity pebbles) >> .SABRA.283 >> 300s  Glucometer: Freestyle  Pt's meals are: - Breakfast/brunch: egg, bacon, toast >> no b'fast  - Lunch: snack mostly (no Humalog ) >> Meals on Wheels - Dinner: chicken/fish + vegetables >> moved before 7 pm - Snacks: Belvita cracker if hungry  -+ Stage III CKD -she has not seeing a nephrologist; last BUN/creatinine:  Lab Results  Component Value Date   BUN 19 09/02/2023   BUN 15 01/13/2023   CREATININE 0.93 09/02/2023   CREATININE 1.09 (H) 01/13/2023   Lab Results  Component Value Date   MICRALBCREAT 74 (H) 08/14/2023   MICRALBCREAT 16 07/25/2016   MICRALBCREAT 57 (H) 10/25/2015   Lab Results  Component Value Date   GFRAA 54 (L) 03/02/2020   GFRAA 59 (L) 08/31/2019   GFRAA 58 (L) 03/24/2019   GFRAA 59 (L) 03/12/2019   GFRAA 55 (L) 03/08/2019  Previously on lisinopril  10, but stopped  due to hyperkalemia.   -+ HL; last set of lipids: Lab Results  Component Value Date   CHOL 144 04/11/2023   HDL 66 04/11/2023   LDLCALC 56 04/11/2023   TRIG 137 04/11/2023   CHOLHDL 2.2 04/11/2023  On lovastatin  40 mg daily.  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 Valdemar).  She has history of cataract surgery OU.  -+ Numbness and tingling in her fingers-on Neurontin  300 mg 3x a day per PCP.  Latest foot exam 04/11/2023.  Low vitamin B12:  Reviewed B12 levels: Lab Results  Component Value Date   VITAMINB12 1,032 01/13/2023   VITAMINB12 >1500 (H) 11/28/2021   VITAMINB12 >1550 (H) 12/06/2020   VITAMINB12 >1526 (H) 07/29/2019   VITAMINB12 >1500 (H) 03/30/2019   VITAMINB12 >1500 (H)  12/28/2018   VITAMINB12 261 03/16/2018   VITAMINB12 300 04/14/2017   VITAMINB12 478 10/25/2015  05/01/2016: Vit B12 248.  We initially started 5000 mcg B12 daily, which was then decreased to 2500 mcg daily and, then to 1000 mcg daily >> every other day >> 2x a week - 11/2020 >> decreased to once a week 11/2021.  She continues to be in a wheelchair as her left leg is very weak-believed to be from a herniated intervertebral disc.  She had surgery for this in 2016 but the strength did not improve. She was in the emergency room on 10/24/2020 for R wrist pain and swelling.  She also sees Dr. Elta.  No fracture. Has a brace. Her ulnar nerve is affected by using her home wheelchair. She was in the emergency room 11/28/2020 for chest pain.  Troponins were negative.  D-dimer was still elevated. She continues to have neck and back pain.   She has an extensive FH of heart disease - women in her family. Daughter died after her heart stopped.  ROS: + See HPI  I reviewed pt's medications, allergies, PMH, social hx, family hx, and changes were documented in the history of present illness. Otherwise, unchanged from my initial visit note.  Past Medical History:  Diagnosis Date   Abdominal pain, chronic, left lower quadrant 08/25/2018   Elevated LFTs    Family history of heart disease 12/04/2018   Fatty liver    on US .    Hyperlipemia    Hypertension    Inguinal hernia    2020 CT   Morbid obesity (HCC) 07/04/2015   Numbness and tingling in right hand 03/01/2021   Palpitation 03/08/2021   Personal history of noncompliance with medical treatment, presenting hazards to health 01/29/2017   Stage 3b chronic kidney disease (HCC)    Thoracic spondylosis with myelopathy    Umbilical hernia    2020 CT   Uncontrolled type 2 diabetes with neuropathy    Weakness of left leg 07/04/2015   Wheelchair bound    paraplegia of Leg - Left   Past Surgical History:  Procedure Laterality Date   APPENDECTOMY   1969   BREAST BIOPSY Right    CATARACT EXTRACTION     May 78983   LUMBAR DISC SURGERY     LUMBAR LAMINECTOMY/DECOMPRESSION MICRODISCECTOMY N/A 07/06/2015   Procedure: Thoracic ten- eleven laminectomy for spinal canal decompression;  Surgeon: Rockey Peru, MD;  Location: [facility];  Service: Orthopedics;  Laterality: N/A;   Social History   Social History   Marital status: Divorced    Spouse name: N/A   Number of children: 0   Occupational History  None   Social History Main Topics   Smoking status: Never Smoker   Smokeless tobacco: Never Used   Alcohol use No   Drug use: No   Current Outpatient Medications on File Prior to Visit  Medication Sig Dispense Refill   ACCU-CHEK GUIDE test strip USE AS DIRECTED TO TEST BLOOD SUGAR FOUR TIMES DAILY 400 strip 1   acetaminophen  (TYLENOL ) 650 MG CR tablet Take 650 mg by mouth every 8 (eight) hours as needed for pain.     acetaminophen -codeine  (TYLENOL  #3) 300-30 MG tablet Take 1 tablet by mouth every 8 (eight) hours as needed for moderate pain (pain score 4-6). 21 tablet 0   AMBULATORY NON FORMULARY MEDICATION Suction pump, home model, portable or stationary, electric, any type, for use with PureWick urinary collection system. 1 each 0   AMBULATORY NON FORMULARY MEDICATION Pump tubing, used with suction pump, each. [phone]) For use with PureWick urinary collection system. Replace tube every 60 days or sooner with signs of wear. 1 each 11   AMBULATORY NON FORMULARY MEDICATION Collector tubing, used with suction pump, each. [phone]) For use with PureWick urinary collection system. Replace tube every 60 days or sooner with signs of wear. 1 each 11   AMBULATORY NON FORMULARY MEDICATION Canister, non disposable, used with suction pump, each. [phone]) For use with PureWick urinary collection system. Replace canister every 60 days. 1 each 11   AMBULATORY NON FORMULARY MEDICATION External urinary catheter, disposable, with wicking material, for use  with suction pump, per month. [phone]). For use with PureWick urinary collection system. Apply new external catheter every 8-12 hours for urinary collection to allow for wound healing to buttocks. 90 each 11   AMBULATORY NON FORMULARY MEDICATION Hydrocolloid dressing, for sacral pressure wound, coverage for 2 x 2 area, any brand. Change dressing every 3 days or sooner if soiled. For continuous use until discontinued. 30 each 11   amLODipine  (NORVASC ) 10 MG tablet Take 1 tablet (10 mg total) by mouth daily. 90 tablet 3   aspirin  EC 81 MG tablet Take 81 mg by mouth every evening.     Cholecalciferol (VITAMIN D  PO) Take 1,000 mg by mouth daily.      Continuous Blood Gluc Receiver (DEXCOM G6 RECEIVER) DEVI by Does not apply route.     Cyanocobalamin  (VITAMIN B 12 PO) Take 5,000 mg by mouth daily.     diclofenac  Sodium (VOLTAREN ) 1 % GEL Apply 4 g topically 4 (four) times daily. 100 g 0   dicyclomine  (BENTYL ) 10 MG capsule Take 1 capsule (10 mg total) by mouth 3 (three) times daily before meals. 270 capsule 3   gabapentin  (NEURONTIN ) 300 MG capsule Take 1 capsule (300 mg total) by mouth 3 (three) times daily. 270 capsule 3   Glucagon  (GVOKE HYPOPEN  2-PACK) 1 MG/0.2ML SOAJ Use for emergency low blood sugar treatment. 0.4 mL 3   hydrochlorothiazide  (MICROZIDE ) 12.5 MG capsule Take 1 capsule (12.5 mg total) by mouth daily. 90 capsule 1   insulin  lispro (HUMALOG ) 100 UNIT/ML KwikPen INJECT 12-20 UNITS UNDER THE SKIN 2-3 TIMES DAILY 45 mL 3   Insulin  Pen Needle (CAREFINE PEN NEEDLES) 32G X 4 MM MISC Use 4x a day 300 each 3   lovastatin  (MEVACOR ) 40 MG tablet Take 1 tablet (40 mg total) by mouth at bedtime. 90 tablet 1   metFORMIN  (GLUCOPHAGE -XR) 500 MG 24 hr tablet TAKE 1 TABLET(500 MG) BY MOUTH TWICE DAILY 180 tablet 1   nitroGLYCERIN  (NITROSTAT ) 0.4 MG SL tablet Place  1 tablet (0.4 mg total) under the tongue every 5 (five) minutes as needed for chest pain. 25 tablet 3   nystatin  (MYCOSTATIN /NYSTOP ) powder  Apply 1 Application topically 3 (three) times daily. 60 g 3   polyethylene glycol (MIRALAX  / GLYCOLAX ) 17 g packet Take 17 g by mouth as needed.     Semaglutide , 2 MG/DOSE, (OZEMPIC , 2 MG/DOSE,) 8 MG/3ML SOPN Inject 2 mg into the skin once a week. 9 mL 3   TOUJEO  SOLOSTAR 300 UNIT/ML Solostar Pen Inject 30-32 Units into the skin at bedtime. 12 mL 3   No current facility-administered medications on file prior to visit.   Allergies  Allergen Reactions   Tramadol  Nausea And Vomiting    Severe N/V- with Tramadol    Jardiance  [Empagliflozin ]    Other     PT PREFERS TO NOT HAVE ANY NARCOTIC MEDICATIONS   Chlorhexidine Rash    Burning after using CHG wipes-used for surgery   Oxycodone  Hcl     Other reaction(s): Hallucination Marked hallucinations and palpitations following dose of 10mg  on 04/30/2015    Family History  Problem Relation Age of Onset   Diabetes Mother    Heart disease Mother    Pancreatic cancer Sister    Kidney disease Brother        Two brothers on ESRD   Amblyopia Neg Hx    Blindness Neg Hx    Cataracts Neg Hx    Glaucoma Neg Hx    Macular degeneration Neg Hx    Retinal detachment Neg Hx    Strabismus Neg Hx    Retinitis pigmentosa Neg Hx    Colon cancer Neg Hx    Esophageal cancer Neg Hx    Pt has FH of DM in M, MGM, PGM, M aunt, uncles.  PE: BP 120/72   Pulse 72   SpO2 97% patient could not be weighed today. Wt Readings from Last 3 Encounters:  09/02/23 204 lb (92.5 kg)  08/14/23 207 lb 6.4 oz (94.1 kg)  04/04/23 218 lb (98.9 kg)   Constitutional: overweight, in NAD, in wheelchair Eyes: EOMI, no exophthalmos ENT: no thyromegaly, no cervical lymphadenopathy Cardiovascular: RRR, No MRG, + L ankle edema - pitting (after fall 2017) Respiratory: CTA B Musculoskeletal: + paraplegia Skin: no rashes Neurological: no tremor with outstretched hands  ASSESSMENT: 1. DM2, insulin -dependent, uncontrolled, with complications - CKD stage 3 - PN - DR  -  Freestyle libre CGM was not covered by the insurance  2. Low B12  3. HL  PLAN:  1. Patient with longstanding, uncontrolled, type 2 diabetes, on metformin , basal-bolus insulin  regimen, and GLP-1 receptor agonist, increased at last visit.  At that time, HbA1c was slightly higher, at 7.0%, increased from 6.8%.  Sugars appeared to be worse, increasing initially after breakfast and then more so after lunch, with a peak around 5 PM.  She had another hyperglycemic peak around 10 PM.  Upon questioning, she relaxed her diet, having more dietary indiscretions.  She was planning to improve these, but I also suggested to go back to the higher dose of Ozempic , 2 mg weekly. CGM interpretation: -At today's visit, we reviewed her CGM downloads: It appears that 76% of values are in target range (goal >70%), while 24% are higher than 180 (goal <25%), and 0% are lower than 70 (goal <4%).  The calculated average blood sugar is 160.  The projected HbA1c for the next 3 months (GMI) is 7.1%. -Reviewing the CGM trends, sugars appear to be slightly  higher in the last 2 weeks compared to the previous 2 weeks, and this may be related to her being off Ozempic , but she did notice an improvement in blood sugars after she started back on it.  In the 2 weeks prior to the last 2 week period, sugars were much better (see HPI).  For now, we discussed about using a higher dose of Humalog  before larger, but otherwise there is no need to change her regimen.  She does mention that when she increases her Toujeo  dose to 30 units, she sees more low blood sugars, so for now we will continue the 28 unit dose. - I suggested to:  Patient Instructions  Please continue: - Metformin  ER 500 mg 1-2x a day with meals - Toujeo  28 units at night - Humalog  14 units before the meals (adjusted by the size of the meals) - Ozempic  2 mg weekly  Please return in 4 months (before 04/10/2024).   - we checked her HbA1c: 6.5% (lowest since 2019!) - advised  to check sugars at different times of the day - 4x a day, rotating check times - advised for yearly eye exams >> she is UTD - She does have elevated ACR.  She was previously taken off ACE inhibitors and I was reticent to add this back due to her borderline high potassium.  Will repeat her ACR today.  - Plan to the clinic in 4 months.  2. Low B12 -Patient has a history of low B12 for which we started supplementation with p.o. vitamin B12 -We had to reduce the dose subsequently as her B12 was elevated.  She takes 1000 mcg B12 weekly. - Latest B12 level was normal: Lab Results  Component Value Date   VITAMINB12 1,032 01/13/2023   VITAMINB12 >1500 (H) 11/28/2021   3. HL - Fractions were at goal in 03/2023: Lab Results  Component Value Date   CHOL 144 04/11/2023   HDL 66 04/11/2023   LDLCALC 56 04/11/2023   TRIG 137 04/11/2023   CHOLHDL 2.2 04/11/2023  -She continues on Mevacor  40 mg daily without side effects  Component     Latest Ref Rng 12/19/2023  Hemoglobin A1C     4.0 - 5.6 % 6.5 !   Creatinine, Urine     20 - 275 mg/dL 835   Microalb, Ur     mg/dL 88.5   MICROALB/CREAT RATIO     <30 mg/g creat 70 (H)   ACR is slightly better.  For now, we will continue to keep an eye on this.  Of note, she was not able to tolerate an ACE inhibitor in the past due to hyperkalemia.  At next visit, we will see if we can add an SGLT2 inhibitor.  Lela Fendt, MD PhD [facility] Endocrinology

## 2023-12-19 NOTE — Addendum Note (Signed)
 Addended by: CLEOTILDE ROLIN RAMAN on: 12/19/2023 01:50 PM   Modules accepted: Orders

## 2023-12-19 NOTE — Patient Instructions (Addendum)
 Please continue: - Metformin  ER 500 mg 1-2x a day with meals - Toujeo  28 units at night - Humalog  14 units before the meals (adjusted by the size of the meals) - Ozempic  2 mg weekly  Please return in 4 months (before 04/10/2024).

## 2023-12-20 DIAGNOSIS — E1165 Type 2 diabetes mellitus with hyperglycemia: Secondary | ICD-10-CM | POA: Diagnosis not present

## 2023-12-20 DIAGNOSIS — Z794 Long term (current) use of insulin: Secondary | ICD-10-CM | POA: Diagnosis not present

## 2023-12-20 DIAGNOSIS — E118 Type 2 diabetes mellitus with unspecified complications: Secondary | ICD-10-CM | POA: Diagnosis not present

## 2023-12-20 LAB — MICROALBUMIN / CREATININE URINE RATIO
Creatinine, Urine: 164 mg/dL (ref 20–275)
Microalb Creat Ratio: 70 mg/g{creat} — ABNORMAL HIGH (ref <30)
Microalb, Ur: 11.4 mg/dL

## 2023-12-22 ENCOUNTER — Ambulatory Visit: Payer: Self-pay | Admitting: Internal Medicine

## 2023-12-23 ENCOUNTER — Ambulatory Visit: Payer: 59

## 2023-12-23 DIAGNOSIS — Z0000 Encounter for general adult medical examination without abnormal findings: Secondary | ICD-10-CM | POA: Diagnosis not present

## 2023-12-23 NOTE — Progress Notes (Signed)
 Subjective:   Cassandra Stewart is a 73 y.o. who presents for a Medicare Wellness preventive visit.  As a reminder, Annual Wellness Visits don't include a physical exam, and some assessments may be limited, especially if this visit is performed virtually. We may recommend an in-person follow-up visit with your provider if needed.  Visit Complete: Virtual I connected with  Cassandra Stewart on 12/23/23 by a video and audio enabled telemedicine application and verified that I am speaking with the correct person using two identifiers.  Patient Location: Home  Provider Location: Office/Clinic  I discussed the limitations of evaluation and management by telemedicine. The patient expressed understanding and agreed to proceed.  Vital Signs: Because this visit was a virtual/telehealth visit, some criteria may be missing or patient reported. Any vitals not documented were not able to be obtained and vitals that have been documented are patient reported.    Persons Participating in Visit: Patient.  AWV Questionnaire: No: Patient Medicare AWV questionnaire was not completed prior to this visit.  Cardiac Risk Factors include: advanced age (>40men, >23 women);diabetes mellitus;dyslipidemia;hypertension     Objective:    Today's Vitals   There is no height or weight on file to calculate BMI.     12/23/2023   10:53 AM 04/23/2023   11:50 AM 12/10/2022   10:48 AM 12/07/2021    1:32 PM 05/27/2021    2:56 PM 04/20/2021   11:29 AM 12/04/2020    2:24 PM  Advanced Directives  Does Patient Have a Medical Advance Directive? Yes No Yes Yes No No No  Type of Advance Directive Out of facility DNR (pink MOST or yellow form)  Out of facility DNR (pink MOST or yellow form) Out of facility DNR (pink MOST or yellow form)     Would patient like information on creating a medical advance directive?       Yes (MAU/Ambulatory/Procedural Areas - Information given)    Current Medications  (verified) Outpatient Encounter Medications as of 12/23/2023  Medication Sig   ACCU-CHEK GUIDE test strip USE AS DIRECTED TO TEST BLOOD SUGAR FOUR TIMES DAILY   acetaminophen  (TYLENOL ) 650 MG CR tablet Take 650 mg by mouth every 8 (eight) hours as needed for pain.   acetaminophen -codeine  (TYLENOL  #3) 300-30 MG tablet Take 1 tablet by mouth every 8 (eight) hours as needed for moderate pain (pain score 4-6).   AMBULATORY NON FORMULARY MEDICATION Suction pump, home model, portable or stationary, electric, any type, for use with PureWick urinary collection system.   AMBULATORY NON FORMULARY MEDICATION Pump tubing, used with suction pump, each. [phone]) For use with PureWick urinary collection system. Replace tube every 60 days or sooner with signs of wear.   AMBULATORY NON FORMULARY MEDICATION Collector tubing, used with suction pump, each. [phone]) For use with PureWick urinary collection system. Replace tube every 60 days or sooner with signs of wear.   AMBULATORY NON FORMULARY MEDICATION Canister, non disposable, used with suction pump, each. [phone]) For use with PureWick urinary collection system. Replace canister every 60 days.   AMBULATORY NON FORMULARY MEDICATION External urinary catheter, disposable, with wicking material, for use with suction pump, per month. [phone]). For use with PureWick urinary collection system. Apply new external catheter every 8-12 hours for urinary collection to allow for wound healing to buttocks.   AMBULATORY NON FORMULARY MEDICATION Hydrocolloid dressing, for sacral pressure wound, coverage for 2 x 2 area, any brand. Change dressing every 3 days or sooner if soiled. For continuous use until discontinued.  amLODipine  (NORVASC ) 10 MG tablet Take 1 tablet (10 mg total) by mouth daily.   aspirin  EC 81 MG tablet Take 81 mg by mouth every evening.   Cholecalciferol (VITAMIN D  PO) Take 1,000 mg by mouth daily.    Cyanocobalamin  (VITAMIN B 12 PO) Take 5,000 mg by mouth daily.    diclofenac  Sodium (VOLTAREN ) 1 % GEL Apply 4 g topically 4 (four) times daily.   dicyclomine  (BENTYL ) 10 MG capsule Take 1 capsule (10 mg total) by mouth 3 (three) times daily before meals.   gabapentin  (NEURONTIN ) 300 MG capsule Take 1 capsule (300 mg total) by mouth 3 (three) times daily.   Glucagon  (GVOKE HYPOPEN  2-PACK) 1 MG/0.2ML SOAJ Use for emergency low blood sugar treatment.   hydrochlorothiazide  (MICROZIDE ) 12.5 MG capsule Take 1 capsule (12.5 mg total) by mouth daily.   insulin  lispro (HUMALOG ) 100 UNIT/ML KwikPen INJECT 12-20 UNITS UNDER THE SKIN 2-3 TIMES DAILY   Insulin  Pen Needle (CAREFINE PEN NEEDLES) 32G X 4 MM MISC Use 4x a day   lovastatin  (MEVACOR ) 40 MG tablet Take 1 tablet (40 mg total) by mouth at bedtime.   metFORMIN  (GLUCOPHAGE -XR) 500 MG 24 hr tablet Take 1 tablet (500 mg total) by mouth 2 (two) times daily with a meal.   nitroGLYCERIN  (NITROSTAT ) 0.4 MG SL tablet Place 1 tablet (0.4 mg total) under the tongue every 5 (five) minutes as needed for chest pain.   nystatin  (MYCOSTATIN /NYSTOP ) powder Apply 1 Application topically 3 (three) times daily.   polyethylene glycol (MIRALAX  / GLYCOLAX ) 17 g packet Take 17 g by mouth as needed.   Semaglutide , 2 MG/DOSE, (OZEMPIC , 2 MG/DOSE,) 8 MG/3ML SOPN Inject 2 mg into the skin once a week.   TOUJEO  SOLOSTAR 300 UNIT/ML Solostar Pen Inject 30-32 Units into the skin at bedtime.   No facility-administered encounter medications on file as of 12/23/2023.    Allergies (verified) Tramadol , Jardiance  [empagliflozin ], Other, Chlorhexidine, and Oxycodone  hcl   History: Past Medical History:  Diagnosis Date   Abdominal pain, chronic, left lower quadrant 08/25/2018   Elevated LFTs    Family history of heart disease 12/04/2018   Fatty liver    on US .    Hyperlipemia    Hypertension    Inguinal hernia    2020 CT   Morbid obesity (HCC) 07/04/2015   Numbness and tingling in right hand 03/01/2021   Palpitation 03/08/2021    Personal history of noncompliance with medical treatment, presenting hazards to health 01/29/2017   Stage 3b chronic kidney disease (HCC)    Thoracic spondylosis with myelopathy    Umbilical hernia    2020 CT   Uncontrolled type 2 diabetes with neuropathy    Weakness of left leg 07/04/2015   Wheelchair bound    paraplegia of Leg - Left   Past Surgical History:  Procedure Laterality Date   APPENDECTOMY  1969   BREAST BIOPSY Right    CATARACT EXTRACTION     May 78983   LUMBAR DISC SURGERY     LUMBAR LAMINECTOMY/DECOMPRESSION MICRODISCECTOMY N/A 07/06/2015   Procedure: Thoracic ten- eleven laminectomy for spinal canal decompression;  Surgeon: Rockey Peru, MD;  Location: [facility];  Service: Orthopedics;  Laterality: N/A;   Family History  Problem Relation Age of Onset   Diabetes Mother    Heart disease Mother    Pancreatic cancer Sister    Kidney disease Brother        Two brothers on ESRD   Amblyopia Neg Hx  Blindness Neg Hx    Cataracts Neg Hx    Glaucoma Neg Hx    Macular degeneration Neg Hx    Retinal detachment Neg Hx    Strabismus Neg Hx    Retinitis pigmentosa Neg Hx    Colon cancer Neg Hx    Esophageal cancer Neg Hx    Social History   Socioeconomic History   Marital status: Divorced    Spouse name: Not on file   Number of children: Not on file   Years of education: Not on file   Highest education level: Not on file  Occupational History   Not on file  Tobacco Use   Smoking status: Never   Smokeless tobacco: Never  Vaping Use   Vaping status: Never Used  Substance and Sexual Activity   Alcohol use: No   Drug use: No   Sexual activity: Not Currently  Other Topics Concern   Not on file  Social History Narrative   Not on file   Social Drivers of Health   Financial Resource Strain: Low Risk  (12/23/2023)   Overall Financial Resource Strain (CARDIA)    Difficulty of Paying Living Expenses: Not hard at all  Food Insecurity: No Food Insecurity  (12/23/2023)   Hunger Vital Sign    Worried About Running Out of Food in the Last Year: Never true    Ran Out of Food in the Last Year: Never true  Transportation Needs: No Transportation Needs (12/23/2023)   PRAPARE - Administrator, Civil Service (Medical): No    Lack of Transportation (Non-Medical): No  Physical Activity: Insufficiently Active (12/23/2023)   Exercise Vital Sign    Days of Exercise per Week: 7 days    Minutes of Exercise per Session: 10 min  Stress: No Stress Concern Present (12/23/2023)   [employer] of Occupational Health - Occupational Stress Questionnaire    Feeling of Stress: Not at all  Social Connections: Socially Isolated (12/23/2023)   Social Connection and Isolation Panel    Frequency of Communication with Friends and Family: More than three times a week    Frequency of Social Gatherings with Friends and Family: Three times a week    Attends Religious Services: Never    Active Member of Clubs or Organizations: No    Attends Banker Meetings: Never    Marital Status: Divorced    Tobacco Counseling Counseling given: Not Answered    Clinical Intake:  Pre-visit preparation completed: Yes  Pain : No/denies pain     Nutritional Risks: None Diabetes: Yes CBG done?: No Did pt. bring in CBG monitor from home?: No  Lab Results  Component Value Date   HGBA1C 6.5 (A) 12/19/2023   HGBA1C 7.0 (A) 08/14/2023   HGBA1C 6.8 (A) 04/11/2023     How often do you need to have someone help you when you read instructions, pamphlets, or other written materials from your doctor or pharmacy?: 1 - Never  Interpreter Needed?: No  Information entered by :: NAllen LPN   Activities of Daily Living     12/23/2023   10:48 AM  In your present state of health, do you have any difficulty performing the following activities:  Hearing? 0  Vision? 0  Difficulty concentrating or making decisions? 0  Walking or climbing stairs? 1   Comment wheelchair  Dressing or bathing? 0  Doing errands, shopping? 0  Preparing Food and eating ? N  Using the Toilet? N  In the past  six months, have you accidently leaked urine? N  Do you have problems with loss of bowel control? N  Managing your Medications? N  Managing your Finances? N  Housekeeping or managing your Housekeeping? Y    Patient Care Team: Early, Camie BRAVO, NP as PCP - General (Nurse Practitioner) Trixie File, MD as Consulting Physician (Internal Medicine) Cornelio Bouchard, MD as Consulting Physician (Physical Medicine and Rehabilitation) Valdemar Rogue, MD as Consulting Physician (Ophthalmology)  I have updated your Care Teams any recent Medical Services you may have received from other providers in the past year.     Assessment:   This is a routine wellness examination for Cassandra Stewart.  Hearing/Vision screen Hearing Screening - Comments:: Denies hearing issues Vision Screening - Comments:: Regular eye exams, Dr. Valdemar   Goals Addressed   None    Depression Screen     12/23/2023   10:54 AM 09/02/2023   11:02 AM 04/04/2023   11:22 AM 12/10/2022   10:50 AM 06/28/2022   11:14 AM 06/10/2022   11:21 AM 12/07/2021    1:34 PM  PHQ 2/9 Scores  PHQ - 2 Score 0 0 0 0 1 0 2  PHQ- 9 Score 1   0   2    Fall Risk     12/23/2023   10:53 AM 09/02/2023   11:02 AM 04/04/2023   11:22 AM 12/10/2022   10:49 AM 06/28/2022   11:13 AM  Fall Risk   Falls in the past year? Exclusion - non ambulatory 0 0 0 1  Number falls in past yr:  0  0 0  Injury with Fall?  0  0 1  Comment     fell on knees rt. leg a ittle weak,  Risk for fall due to : Impaired balance/gait;Impaired mobility;Medication side effect No Fall Risks  Medication side effect;Impaired mobility;Impaired balance/gait Other (Comment)  Follow up Falls prevention discussed;Falls evaluation completed Falls evaluation completed  Falls prevention discussed;Falls evaluation completed Falls evaluation completed     MEDICARE RISK AT HOME:  Medicare Risk at Home Any stairs in or around the home?: No If so, are there any without handrails?: No Home free of loose throw rugs in walkways, pet beds, electrical cords, etc?: Yes Adequate lighting in your home to reduce risk of falls?: Yes Life alert?: No Use of a cane, walker or w/c?: Yes Grab bars in the bathroom?: Yes Shower chair or bench in shower?: Yes Elevated toilet seat or a handicapped toilet?: No  TIMED UP AND GO:  Was the test performed?  No  Cognitive Function: 6CIT completed        12/23/2023   10:55 AM 12/10/2022   10:50 AM 12/07/2021    1:39 PM  6CIT Screen  What Year? 0 points 0 points 0 points  What month? 0 points 0 points 0 points  What time? 0 points 0 points 0 points  Count back from 20 0 points 0 points 0 points  Months in reverse 0 points 0 points 0 points  Repeat phrase 0 points 0 points 2 points  Total Score 0 points 0 points 2 points    Immunizations Immunization History  Administered Date(s) Administered   PFIZER(Purple Top)SARS-COV-2 Vaccination 09/16/2019, 10/12/2019   Pneumococcal Polysaccharide-23 04/10/2011    Screening Tests Health Maintenance  Topic Date Due   Colonoscopy  Never done   FOOT EXAM  04/10/2024   HEMOGLOBIN A1C  06/18/2024   OPHTHALMOLOGY EXAM  07/08/2024   Diabetic kidney evaluation - eGFR  measurement  09/01/2024   Diabetic kidney evaluation - Urine ACR  12/18/2024   Medicare Annual Wellness (AWV)  12/22/2024   DEXA SCAN  Completed   Hepatitis C Screening  Completed   Meningococcal B Vaccine  Aged Out   DTaP/Tdap/Td  Discontinued   Pneumococcal Vaccine: 50+ Years  Discontinued   Influenza Vaccine  Discontinued   Mammogram  Discontinued   COVID-19 Vaccine  Discontinued   Zoster Vaccines- Shingrix  Discontinued    Health Maintenance Items Addressed: No longer needs colonoscopies.  Additional Screening:  Vision Screening: Recommended annual ophthalmology exams for early  detection of glaucoma and other disorders of the eye. Is the patient up to date with their annual eye exam?  Yes  Who is the provider or what is the name of the office in which the patient attends annual eye exams? Dr. Valdemar  Dental Screening: Recommended annual dental exams for proper oral hygiene  Community Resource Referral / Chronic Care Management: CRR required this visit?  No   CCM required this visit?  No   Plan:    I have personally reviewed and noted the following in the patient's chart:   Medical and social history Use of alcohol, tobacco or illicit drugs  Current medications and supplements including opioid prescriptions. Patient is not currently taking opioid prescriptions. Functional ability and status Nutritional status Physical activity Advanced directives List of other physicians Hospitalizations, surgeries, and ER visits in previous 12 months Vitals Screenings to include cognitive, depression, and falls Referrals and appointments  In addition, I have reviewed and discussed with patient certain preventive protocols, quality metrics, and best practice recommendations. A written personalized care plan for preventive services as well as general preventive health recommendations were provided to patient.   Cassandra FORBES Dawn, LPN   [id number]   After Visit Summary: (Pick Up) Due to this being a telephonic visit, with patients personalized plan was offered to patient and patient has requested to Pick up at office.  Notes: Nothing significant to report at this time.

## 2023-12-23 NOTE — Patient Instructions (Signed)
 Ms. Cassandra Stewart,  Thank you for taking the time for your Medicare Wellness Visit. I appreciate your continued commitment to your health goals. Please review the care plan we discussed, and feel free to reach out if I can assist you further.  Medicare recommends these wellness visits once per year to help you and your care team stay ahead of potential health issues. These visits are designed to focus on prevention, allowing your provider to concentrate on managing your acute and chronic conditions during your regular appointments.  Please note that Annual Wellness Visits do not include a physical exam. Some assessments may be limited, especially if the visit was conducted virtually. If needed, we may recommend a separate in-person follow-up with your provider.  Ongoing Care Seeing your primary care provider every 3 to 6 months helps us  monitor your health and provide consistent, personalized care.   Referrals If a referral was made during today's visit and you haven't received any updates within two weeks, please contact the referred provider directly to check on the status.  Recommended Screenings:  Health Maintenance  Topic Date Due   Colon Cancer Screening  Never done   Complete foot exam   04/10/2024   Hemoglobin A1C  06/18/2024   Eye exam for diabetics  07/08/2024   Yearly kidney function blood test for diabetes  09/01/2024   Yearly kidney health urinalysis for diabetes  12/18/2024   Medicare Annual Wellness Visit  12/22/2024   DEXA scan (bone density measurement)  Completed   Hepatitis C Screening  Completed   Meningitis B Vaccine  Aged Out   DTaP/Tdap/Td vaccine  Discontinued   Pneumococcal Vaccine for age over 77  Discontinued   Flu Shot  Discontinued   Breast Cancer Screening  Discontinued   COVID-19 Vaccine  Discontinued   Zoster (Shingles) Vaccine  Discontinued       12/23/2023   10:53 AM  Advanced Directives  Does Patient Have a Medical Advance Directive? Yes  Type  of Advance Directive Out of facility DNR (pink MOST or yellow form)   Advance Care Planning is important because it: Ensures you receive medical care that aligns with your values, goals, and preferences. Provides guidance to your family and loved ones, reducing the emotional burden of decision-making during critical moments.  Vision: Annual vision screenings are recommended for early detection of glaucoma, cataracts, and diabetic retinopathy. These exams can also reveal signs of chronic conditions such as diabetes and high blood pressure.  Dental: Annual dental screenings help detect early signs of oral cancer, gum disease, and other conditions linked to overall health, including heart disease and diabetes.  Please see the attached documents for additional preventive care recommendations.

## 2023-12-29 ENCOUNTER — Telehealth: Payer: Self-pay | Admitting: Nurse Practitioner

## 2023-12-29 NOTE — Telephone Encounter (Unsigned)
 Copied from CRM [id number]. Topic: General - Other >> Dec 29, 2023  9:09 AM Aleatha BROCKS wrote: Reason for CRM: Patient is calling again, her  daughter Stoney Beal dropped off paperwork that needs to be faxed to [employer]  fax #[phone]. Patient needs to be done as soon as possible so she can get help

## 2024-01-05 ENCOUNTER — Telehealth: Payer: Self-pay

## 2024-01-05 NOTE — Telephone Encounter (Signed)
 Do you have any paperwork for the following pt.   Copied from CRM #[id number]. Topic: General - Other >> Jan 02, 2024  3:14 PM Everette C wrote: Reason for CRM: The patient has called to request a follow up on their previous request for a letter with the [employer] program regarding their in home assistance.  The patient would like a letter faxed to [phone]  The patient shares that they need a completed letter by 01/07/24 Please contact the patient further if needed

## 2024-01-05 NOTE — Progress Notes (Shared)
 [facility] - Clinic Note  01/09/2024     CHIEF COMPLAINT Patient presents for No chief complaint on file.   HISTORY OF PRESENT ILLNESS: Cassandra Stewart is a 73 y.o. female who presents to the clinic today for:     Pt states vision is stable  Referring physician: Early, Camie BRAVO, NP [address] [place],  [place] [address]  HISTORICAL INFORMATION:   Selected notes from the MEDICAL RECORD NUMBER Referred by V. Lendia, NP-C for DM exam LEE-  Ocular Hx- pseudophakia OU (2016, Dr. Dennise at [facility]) PMH- DM2 on insulin , HTN,     CURRENT MEDICATIONS: No current outpatient medications on file. (Ophthalmic Drugs)   No current facility-administered medications for this visit. (Ophthalmic Drugs)   Current Outpatient Medications (Other)  Medication Sig   ACCU-CHEK GUIDE test strip USE AS DIRECTED TO TEST BLOOD SUGAR FOUR TIMES DAILY   acetaminophen  (TYLENOL ) 650 MG CR tablet Take 650 mg by mouth every 8 (eight) hours as needed for pain.   acetaminophen -codeine  (TYLENOL  #3) 300-30 MG tablet Take 1 tablet by mouth every 8 (eight) hours as needed for moderate pain (pain score 4-6).   AMBULATORY NON FORMULARY MEDICATION Suction pump, home model, portable or stationary, electric, any type, for use with PureWick urinary collection system.   AMBULATORY NON FORMULARY MEDICATION Pump tubing, used with suction pump, each. [phone]) For use with PureWick urinary collection system. Replace tube every 60 days or sooner with signs of wear.   AMBULATORY NON FORMULARY MEDICATION Collector tubing, used with suction pump, each. [phone]) For use with PureWick urinary collection system. Replace tube every 60 days or sooner with signs of wear.   AMBULATORY NON FORMULARY MEDICATION Canister, non disposable, used with suction pump, each. [phone]) For use with PureWick urinary collection system. Replace canister every 60 days.   AMBULATORY NON FORMULARY MEDICATION External  urinary catheter, disposable, with wicking material, for use with suction pump, per month. [phone]). For use with PureWick urinary collection system. Apply new external catheter every 8-12 hours for urinary collection to allow for wound healing to buttocks.   AMBULATORY NON FORMULARY MEDICATION Hydrocolloid dressing, for sacral pressure wound, coverage for 2 x 2 area, any brand. Change dressing every 3 days or sooner if soiled. For continuous use until discontinued.   amLODipine  (NORVASC ) 10 MG tablet Take 1 tablet (10 mg total) by mouth daily.   aspirin  EC 81 MG tablet Take 81 mg by mouth every evening.   Cholecalciferol (VITAMIN D  PO) Take 1,000 mg by mouth daily.    Cyanocobalamin  (VITAMIN B 12 PO) Take 5,000 mg by mouth daily.   diclofenac  Sodium (VOLTAREN ) 1 % GEL Apply 4 g topically 4 (four) times daily.   dicyclomine  (BENTYL ) 10 MG capsule Take 1 capsule (10 mg total) by mouth 3 (three) times daily before meals.   gabapentin  (NEURONTIN ) 300 MG capsule Take 1 capsule (300 mg total) by mouth 3 (three) times daily.   Glucagon  (GVOKE HYPOPEN  2-PACK) 1 MG/0.2ML SOAJ Use for emergency low blood sugar treatment.   hydrochlorothiazide  (MICROZIDE ) 12.5 MG capsule Take 1 capsule (12.5 mg total) by mouth daily.   insulin  lispro (HUMALOG ) 100 UNIT/ML KwikPen INJECT 12-20 UNITS UNDER THE SKIN 2-3 TIMES DAILY   Insulin  Pen Needle (CAREFINE PEN NEEDLES) 32G X 4 MM MISC Use 4x a day   lovastatin  (MEVACOR ) 40 MG tablet Take 1 tablet (40 mg total) by mouth at bedtime.   metFORMIN  (GLUCOPHAGE -XR) 500 MG 24 hr tablet  Take 1 tablet (500 mg total) by mouth 2 (two) times daily with a meal.   nitroGLYCERIN  (NITROSTAT ) 0.4 MG SL tablet Place 1 tablet (0.4 mg total) under the tongue every 5 (five) minutes as needed for chest pain.   nystatin  (MYCOSTATIN /NYSTOP ) powder Apply 1 Application topically 3 (three) times daily.   polyethylene glycol (MIRALAX  / GLYCOLAX ) 17 g packet Take 17 g by mouth as needed.    Semaglutide , 2 MG/DOSE, (OZEMPIC , 2 MG/DOSE,) 8 MG/3ML SOPN Inject 2 mg into the skin once a week.   TOUJEO  SOLOSTAR 300 UNIT/ML Solostar Pen Inject 30-32 Units into the skin at bedtime.   No current facility-administered medications for this visit. (Other)   REVIEW OF SYSTEMS:    ALLERGIES Allergies  Allergen Reactions   Tramadol  Nausea And Vomiting    Severe N/V- with Tramadol    Jardiance  [Empagliflozin ]    Other     PT PREFERS TO NOT HAVE ANY NARCOTIC MEDICATIONS   Chlorhexidine Rash    Burning after using CHG wipes-used for surgery   Oxycodone  Hcl     Other reaction(s): Hallucination Marked hallucinations and palpitations following dose of 10mg  on 04/30/2015    PAST MEDICAL HISTORY Past Medical History:  Diagnosis Date   Abdominal pain, chronic, left lower quadrant 08/25/2018   Elevated LFTs    Family history of heart disease 12/04/2018   Fatty liver    on US .    Hyperlipemia    Hypertension    Inguinal hernia    2020 CT   Morbid obesity (HCC) 07/04/2015   Numbness and tingling in right hand 03/01/2021   Palpitation 03/08/2021   Personal history of noncompliance with medical treatment, presenting hazards to health 01/29/2017   Stage 3b chronic kidney disease (HCC)    Thoracic spondylosis with myelopathy    Umbilical hernia    2020 CT   Uncontrolled type 2 diabetes with neuropathy    Weakness of left leg 07/04/2015   Wheelchair bound    paraplegia of Leg - Left   Past Surgical History:  Procedure Laterality Date   APPENDECTOMY  1969   BREAST BIOPSY Right    CATARACT EXTRACTION     May 78983   LUMBAR DISC SURGERY     LUMBAR LAMINECTOMY/DECOMPRESSION MICRODISCECTOMY N/A 07/06/2015   Procedure: Thoracic ten- eleven laminectomy for spinal canal decompression;  Surgeon: Rockey Peru, MD;  Location: [facility];  Service: Orthopedics;  Laterality: N/A;   FAMILY HISTORY Family History  Problem Relation Age of Onset   Diabetes Mother    Heart disease Mother     Pancreatic cancer Sister    Kidney disease Brother        Two brothers on ESRD   Amblyopia Neg Hx    Blindness Neg Hx    Cataracts Neg Hx    Glaucoma Neg Hx    Macular degeneration Neg Hx    Retinal detachment Neg Hx    Strabismus Neg Hx    Retinitis pigmentosa Neg Hx    Colon cancer Neg Hx    Esophageal cancer Neg Hx    SOCIAL HISTORY Social History   Tobacco Use   Smoking status: Never   Smokeless tobacco: Never  Vaping Use   Vaping status: Never Used  Substance Use Topics   Alcohol use: No   Drug use: No       OPHTHALMIC EXAM:  Not recorded    IMAGING AND PROCEDURES  Imaging and Procedures for 06/09/17  ASSESSMENT/PLAN:  No diagnosis found.   1-4. DM2 w/ mild non-proliferative diabetic retinopathy, OU  - last A1c 7.0 on 06.1.25, 6.8 on 02.14.25  - exam with scattered MAs and improved CWSs OU  - OCT shows OD: Tr cystic changes / IRHM inferior macula--slightly improved ; OS: +DME--Interval improvement in IRF temporal fovea at 3 months  - BCVA decreased to 20/25 OU from 20/20 OU  - f/u 3 months -- repeat DFE, OCT  5,6. Hypertensive retinopathy OU - mild  - discussed importance of tight BP control  - monitor   7. PVD / vitreous syneresis OU  - asymptomatic  - Discussed findings and prognosis  - No RT or RD on 360 peripheral exam  - Reviewed s/s of RT/RD  - strict return precautions for any such RT/RD symptoms  8. Pseudophakia OU  - s/p CE/IOL in 2016, [facility], Dr. Dennise  - beautiful surgeries, doing well  - monitor   9. PCO OU  - s/p YAG OS (07.14.21)  - good opening, BCVA improved to 20/20 OS  - monitor    Ophthalmic Meds Ordered this visit:  No orders of the defined types were placed in this encounter.    No follow-ups on file.  There are no Patient Instructions on file for this visit.   This document serves as a record of services personally performed by Redell JUDITHANN Hans, MD, PhD. It was created on their behalf by  Delon Newness COT, an ophthalmic technician. The creation of this record is the provider's dictation and/or activities during the visit.    Electronically signed by: Delon Newness COT 11.10.25 10:25 AM   Abbreviations: M myopia (nearsighted); A astigmatism; H hyperopia (farsighted); P presbyopia; Mrx spectacle prescription;  CTL contact lenses; OD right eye; OS left eye; OU both eyes  XT exotropia; ET esotropia; PEK punctate epithelial keratitis; PEE punctate epithelial erosions; DES dry eye syndrome; MGD meibomian gland dysfunction; ATs artificial tears; PFAT's preservative free artificial tears; NSC nuclear sclerotic cataract; PSC posterior subcapsular cataract; ERM epi-retinal membrane; PVD posterior vitreous detachment; RD retinal detachment; DM diabetes mellitus; DR diabetic retinopathy; NPDR non-proliferative diabetic retinopathy; PDR proliferative diabetic retinopathy; CSME clinically significant macular edema; DME diabetic macular edema; dbh dot blot hemorrhages; CWS cotton wool spot; POAG primary open angle glaucoma; C/D cup-to-disc ratio; HVF humphrey visual field; GVF goldmann visual field; OCT optical coherence tomography; IOP intraocular pressure; BRVO Branch retinal vein occlusion; CRVO central retinal vein occlusion; CRAO central retinal artery occlusion; BRAO branch retinal artery occlusion; RT retinal tear; SB scleral buckle; PPV pars plana vitrectomy; VH Vitreous hemorrhage; PRP panretinal laser photocoagulation; IVK intravitreal kenalog; VMT vitreomacular traction; MH Macular hole;  NVD neovascularization of the disc; NVE neovascularization elsewhere; AREDS age related eye disease study; ARMD age related macular degeneration; POAG primary open angle glaucoma; EBMD epithelial/anterior basement membrane dystrophy; ACIOL anterior chamber intraocular lens; IOL intraocular lens; PCIOL posterior chamber intraocular lens; Phaco/IOL phacoemulsification with intraocular lens placement;  PRK photorefractive keratectomy; LASIK laser assisted in situ keratomileusis; HTN hypertension; DM diabetes mellitus; COPD chronic obstructive pulmonary disease

## 2024-01-08 NOTE — Telephone Encounter (Signed)
 Form completed and patient snapshot printed to be faxed back with paperwork.

## 2024-01-09 ENCOUNTER — Encounter (INDEPENDENT_AMBULATORY_CARE_PROVIDER_SITE_OTHER): Admitting: Ophthalmology

## 2024-01-09 DIAGNOSIS — H43813 Vitreous degeneration, bilateral: Secondary | ICD-10-CM

## 2024-01-09 DIAGNOSIS — Z794 Long term (current) use of insulin: Secondary | ICD-10-CM

## 2024-01-09 DIAGNOSIS — E113213 Type 2 diabetes mellitus with mild nonproliferative diabetic retinopathy with macular edema, bilateral: Secondary | ICD-10-CM

## 2024-01-09 DIAGNOSIS — I10 Essential (primary) hypertension: Secondary | ICD-10-CM

## 2024-01-09 DIAGNOSIS — H26492 Other secondary cataract, left eye: Secondary | ICD-10-CM

## 2024-01-09 DIAGNOSIS — E11319 Type 2 diabetes mellitus with unspecified diabetic retinopathy without macular edema: Secondary | ICD-10-CM

## 2024-01-09 DIAGNOSIS — H35033 Hypertensive retinopathy, bilateral: Secondary | ICD-10-CM

## 2024-01-09 DIAGNOSIS — Z7984 Long term (current) use of oral hypoglycemic drugs: Secondary | ICD-10-CM

## 2024-01-09 DIAGNOSIS — Z961 Presence of intraocular lens: Secondary | ICD-10-CM

## 2024-03-04 NOTE — Progress Notes (Unsigned)
{  SEHM (Optional):34217}  Camie FORBES Doing, DNP, AGNP-c [facility] [address] [place], [place] [address] [phone]   ACUTE VISIT : Acute or New Concern Visit on 03/05/2024  There were no vitals taken for this visit.   Subjective:  No chief complaint on file.  ***  Left shoulder ROS negative except for what is listed in HPI. History, Medications, Surgery, SDOH, and Family History reviewed and updated as appropriate.  Objective:  Physical Exam      Assessment & Plan:   Assessment & Plan Blepharitis of upper and lower eyelids of both eyes, unspecified type     Muscle spasms of neck     Chronic left shoulder pain       Camie FORBES Doing, DNP, AGNP-c  {SETIMEYorN (Optional):34216}

## 2024-03-05 ENCOUNTER — Ambulatory Visit (INDEPENDENT_AMBULATORY_CARE_PROVIDER_SITE_OTHER): Payer: Self-pay | Admitting: Nurse Practitioner

## 2024-03-05 ENCOUNTER — Encounter: Payer: Self-pay | Admitting: Nurse Practitioner

## 2024-03-05 ENCOUNTER — Other Ambulatory Visit (HOSPITAL_COMMUNITY): Payer: Self-pay

## 2024-03-05 VITALS — BP 128/82 | HR 68

## 2024-03-05 DIAGNOSIS — H0100A Unspecified blepharitis right eye, upper and lower eyelids: Secondary | ICD-10-CM

## 2024-03-05 DIAGNOSIS — M25512 Pain in left shoulder: Secondary | ICD-10-CM

## 2024-03-05 DIAGNOSIS — H0100B Unspecified blepharitis left eye, upper and lower eyelids: Secondary | ICD-10-CM

## 2024-03-05 DIAGNOSIS — G8929 Other chronic pain: Secondary | ICD-10-CM | POA: Diagnosis not present

## 2024-03-05 DIAGNOSIS — M62838 Other muscle spasm: Secondary | ICD-10-CM

## 2024-03-05 MED ORDER — HYDROCORTISONE 0.5 % EX CREA
1.0000 | TOPICAL_CREAM | Freq: Two times a day (BID) | CUTANEOUS | 0 refills | Status: AC
  Filled 2024-03-05: qty 30, 15d supply, fill #0

## 2024-03-05 MED ORDER — ERYTHROMYCIN 5 MG/GM OP OINT
1.0000 | TOPICAL_OINTMENT | Freq: Every day | OPHTHALMIC | 0 refills | Status: AC
  Filled 2024-03-05: qty 3.5, 30d supply, fill #0
  Filled 2024-03-08: qty 3.5, 7d supply, fill #0

## 2024-03-05 NOTE — Patient Instructions (Signed)
 Shoulder Range of Motion Exercises Shoulder range of motion (ROM) exercises are done to keep the shoulder moving freely or to increase movement. They are recommended for people who have shoulder pain or stiffness or who are recovering from a shoulder surgery. Ask your health care provider which exercises are safe for you. Do exercises exactly as told by your health care provider and adjust them as directed. It is normal to feel mild stretching, pulling, tightness, or discomfort as you do these exercises. Stop right away if you feel sudden pain or your pain gets worse. Do not begin these exercises until told by your health care provider. Phase 1 exercise When you are able, do this exercise 1-2 times a day for 30-60 seconds in each direction, or as directed by your health care provider. Pendulum exercise     To do this exercise while sitting: Sit in a chair or at the edge of your bed with your feet flat on the floor. Let your affected arm hang down in front of you over the edge of the bed or chair. Relax your shoulder, arm, and hand. Rock your body so your arm gently swings in small circles. You can also use your unaffected arm to start the motion. Repeat, changing the direction of the circles, swinging your arm left and right, and swinging your arm forward and back. To do this exercise while standing: Stand next to a sturdy chair or table, and hold on to it with your hand on your unaffected side. Bend forward at the waist. Bend your knees slightly. Relax your shoulder, arm, and hand. While keeping your shoulder relaxed, use body motion to swing your arm in small circles. Repeat, changing the direction of the circles, swinging your arm left and right, and swinging your arm forward and back. Between exercises, stand up tall and take a short break to relax your lower back.  Phase 2 exercises Do these exercises 1-2 times a day or as told by your health care provider. Hold each stretch for 30  seconds, and repeat 3 times. Do the exercises with one or both arms as instructed by your health care provider. For these exercises, sit at a table with your hand and arm supported by the table. A chair that slides easily or has wheels can be helpful. External rotation  Turn your chair so that your affected side is nearest to the table. Place your forearm on the table to your side. Bend your arm to about a 90-degree angle (right angle) at the elbow, and place your hand palm-down on the table. Your elbow should be about 6 inches (15 cm) away from your side. Keeping your arm on the table, lean your body forward. Abduction  Turn your chair so that your affected side is nearest to the table. Place your forearm and hand on the table so that your thumb points toward the ceiling and your arm is straight out to your side. Slide your hand out to the side and away from you. To increase the stretch, you can slide your chair away from the table. Flexion: forward stretch  Sit facing the table. Place your hand and elbow on the table in front of you. Slide your hand forward and away from you, using your unaffected arm to do the work. To increase the stretch, you can slide your chair backward. Phase 3 exercises Do these exercises 1-2 times a day or as told by your health care provider. Hold each stretch for 30 seconds, and  repeat 3 times. Do the exercises with one or both arms as instructed by your health care provider. You will need a cane, a piece of PVC pipe, or a sturdy wooden dowel for the wand exercises. Cross-body stretch: posterior capsule stretch  Lift your arm straight out in front of you. Bend your arm in a 90-degree angle (right angle) at the elbow so your forearm moves across your body. Use your other arm to gently pull the elbow across your body, toward your other shoulder. Wall climbs  Stand with your affected arm extended out to the side with your hand resting on a door frame. Slide your  hand slowly up the door frame. To increase the stretch, step through the door frame. Keep your body upright and do not lean. Flexion     To do this exercise while standing: Hold the wand with both of your hands, palms-down. Lift the wand up and over your head, if able. Lift mostly with your affected arm, and use the other arm to help. Push upward with your other arm to gently increase the stretch. To do this exercise while lying down: Lie on your back with your elbows resting on the floor and the wand in both your hands. Your hands will be palm-down, or pointing toward your feet. Lift your hands toward the ceiling, using your unaffected arm to help if needed. Bring your arms overhead as able, using your unaffected arm to help if needed.  Internal rotation  Stand while holding the wand behind you with both hands. Your unaffected arm should be extended above your head with the arm of the affected side extended behind you at the level of your waist. The wand should be pointing straight up and down as you hold it. Slowly pull the wand up behind your back by straightening the elbow of your unaffected arm and bending the elbow of your affected arm. External rotation  Lie on your back with your affected upper arm supported on a small pillow or rolled towel. When you first do this exercise, keep your upper arm close to your body. Over time, bring your arm up to a 90-degree angle (right angle) out to the side. Hold the wand across your stomach and with both hands palm-up. Your elbow on your affected side should be bent at a 90-degree angle. Use your unaffected side to help push your forearm away from you and toward the floor. Keep your elbow on your affected side bent at a 90-degree angle. This information is not intended to replace advice given to you by your health care provider. Make sure you discuss any questions you have with your health care provider. Document Revised: 04/03/2021 Document  Reviewed: 04/03/2021 Elsevier Patient Education  2024 [employer].

## 2024-03-08 ENCOUNTER — Other Ambulatory Visit (HOSPITAL_COMMUNITY): Payer: Self-pay

## 2024-03-08 ENCOUNTER — Other Ambulatory Visit: Payer: Self-pay

## 2024-03-08 ENCOUNTER — Encounter: Payer: Self-pay | Admitting: Nurse Practitioner

## 2024-03-09 ENCOUNTER — Other Ambulatory Visit: Payer: Self-pay

## 2024-03-11 ENCOUNTER — Other Ambulatory Visit (HOSPITAL_COMMUNITY): Payer: Self-pay

## 2024-03-11 ENCOUNTER — Other Ambulatory Visit: Payer: Self-pay

## 2024-03-11 ENCOUNTER — Other Ambulatory Visit (HOSPITAL_BASED_OUTPATIENT_CLINIC_OR_DEPARTMENT_OTHER): Payer: Self-pay

## 2024-03-12 ENCOUNTER — Other Ambulatory Visit: Payer: Self-pay | Admitting: Nurse Practitioner

## 2024-03-12 ENCOUNTER — Other Ambulatory Visit: Payer: Self-pay

## 2024-03-12 ENCOUNTER — Other Ambulatory Visit (HOSPITAL_COMMUNITY): Payer: Self-pay

## 2024-03-12 DIAGNOSIS — K760 Fatty (change of) liver, not elsewhere classified: Secondary | ICD-10-CM

## 2024-03-12 DIAGNOSIS — N1832 Chronic kidney disease, stage 3b: Secondary | ICD-10-CM

## 2024-03-12 DIAGNOSIS — E1121 Type 2 diabetes mellitus with diabetic nephropathy: Secondary | ICD-10-CM

## 2024-03-12 DIAGNOSIS — E1159 Type 2 diabetes mellitus with other circulatory complications: Secondary | ICD-10-CM

## 2024-03-12 DIAGNOSIS — I700 Atherosclerosis of aorta: Secondary | ICD-10-CM

## 2024-03-12 MED ORDER — LOVASTATIN 40 MG PO TABS
40.0000 mg | ORAL_TABLET | Freq: Every day | ORAL | 1 refills | Status: AC
  Filled 2024-03-12: qty 90, 90d supply, fill #0

## 2024-03-29 ENCOUNTER — Encounter: Payer: Self-pay | Admitting: Physical Medicine and Rehabilitation

## 2024-04-21 ENCOUNTER — Ambulatory Visit: Admitting: Internal Medicine

## 2024-09-22 ENCOUNTER — Encounter: Admitting: Nurse Practitioner

## 2024-12-28 ENCOUNTER — Ambulatory Visit: Payer: Self-pay
